# Patient Record
Sex: Male | Born: 1970 | Race: Black or African American | Hispanic: No | Marital: Married | Smoking: Former smoker
Health system: Southern US, Community
[De-identification: ages and names within clinical notes are randomized; demographics above are authoritative.]

## PROBLEM LIST (undated history)

## (undated) DIAGNOSIS — R809 Proteinuria, unspecified: Secondary | ICD-10-CM

## (undated) DIAGNOSIS — I639 Cerebral infarction, unspecified: Secondary | ICD-10-CM

## (undated) DIAGNOSIS — Z8669 Personal history of other diseases of the nervous system and sense organs: Secondary | ICD-10-CM

## (undated) DIAGNOSIS — I5020 Unspecified systolic (congestive) heart failure: Secondary | ICD-10-CM

## (undated) DIAGNOSIS — N183 Chronic kidney disease, stage 3 unspecified: Secondary | ICD-10-CM

## (undated) DIAGNOSIS — R011 Cardiac murmur, unspecified: Secondary | ICD-10-CM

## (undated) DIAGNOSIS — Z87898 Personal history of other specified conditions: Secondary | ICD-10-CM

## (undated) DIAGNOSIS — R590 Localized enlarged lymph nodes: Secondary | ICD-10-CM

## (undated) DIAGNOSIS — H547 Unspecified visual loss: Secondary | ICD-10-CM

## (undated) DIAGNOSIS — I10 Essential (primary) hypertension: Secondary | ICD-10-CM

## (undated) HISTORY — DX: Personal history of other specified conditions: Z87.898

## (undated) HISTORY — DX: Proteinuria, unspecified: R80.9

## (undated) HISTORY — DX: Personal history of other diseases of the nervous system and sense organs: Z86.69

## (undated) HISTORY — DX: Morbid (severe) obesity due to excess calories: E66.01

## (undated) HISTORY — DX: Unspecified systolic (congestive) heart failure: I50.20

## (undated) HISTORY — DX: Essential (primary) hypertension: I10

## (undated) HISTORY — DX: Unspecified visual loss: H54.7

## (undated) HISTORY — DX: Cardiac murmur, unspecified: R01.1

## (undated) HISTORY — DX: Chronic kidney disease, stage 3 unspecified: N18.30

## (undated) HISTORY — DX: Localized enlarged lymph nodes: R59.0

## (undated) HISTORY — DX: Chronic kidney disease, stage 3 (moderate): N18.3

## (undated) SURGICAL SUPPLY — 2 items
CATH 8FR REPROCESSED SOUNDSTAR (CATHETERS) ×1 IMPLANT
INQWIRE 1.5J .035X260CM (WIRE) ×1 IMPLANT

---

## 2007-03-16 ENCOUNTER — Emergency Department: Payer: Self-pay | Admitting: Unknown Physician Specialty

## 2007-03-16 ENCOUNTER — Other Ambulatory Visit: Payer: Self-pay

## 2007-03-21 ENCOUNTER — Emergency Department: Payer: Self-pay | Admitting: Emergency Medicine

## 2007-07-01 ENCOUNTER — Other Ambulatory Visit: Payer: Self-pay

## 2007-07-01 ENCOUNTER — Emergency Department: Payer: Self-pay | Admitting: Emergency Medicine

## 2007-12-27 DIAGNOSIS — Z8669 Personal history of other diseases of the nervous system and sense organs: Secondary | ICD-10-CM

## 2007-12-27 HISTORY — DX: Personal history of other diseases of the nervous system and sense organs: Z86.69

## 2008-01-06 ENCOUNTER — Emergency Department: Payer: Self-pay | Admitting: Internal Medicine

## 2008-01-20 ENCOUNTER — Ambulatory Visit: Payer: Self-pay | Admitting: Family Medicine

## 2008-01-20 DIAGNOSIS — N183 Chronic kidney disease, stage 3 unspecified: Secondary | ICD-10-CM | POA: Insufficient documentation

## 2008-01-20 DIAGNOSIS — I130 Hypertensive heart and chronic kidney disease with heart failure and stage 1 through stage 4 chronic kidney disease, or unspecified chronic kidney disease: Secondary | ICD-10-CM

## 2008-01-23 ENCOUNTER — Emergency Department: Payer: Self-pay | Admitting: Emergency Medicine

## 2008-01-23 ENCOUNTER — Other Ambulatory Visit: Payer: Self-pay

## 2008-01-26 ENCOUNTER — Telehealth: Payer: Self-pay | Admitting: Family Medicine

## 2008-01-30 ENCOUNTER — Ambulatory Visit: Payer: Self-pay | Admitting: Family Medicine

## 2008-01-30 ENCOUNTER — Encounter (INDEPENDENT_AMBULATORY_CARE_PROVIDER_SITE_OTHER): Payer: Self-pay | Admitting: *Deleted

## 2008-01-30 DIAGNOSIS — G510 Bell's palsy: Secondary | ICD-10-CM | POA: Insufficient documentation

## 2008-02-06 ENCOUNTER — Encounter: Payer: Self-pay | Admitting: Family Medicine

## 2008-02-10 ENCOUNTER — Encounter (INDEPENDENT_AMBULATORY_CARE_PROVIDER_SITE_OTHER): Payer: Self-pay | Admitting: *Deleted

## 2008-02-10 ENCOUNTER — Telehealth (INDEPENDENT_AMBULATORY_CARE_PROVIDER_SITE_OTHER): Payer: Self-pay | Admitting: *Deleted

## 2008-03-19 ENCOUNTER — Ambulatory Visit: Payer: Self-pay | Admitting: Family Medicine

## 2008-03-19 ENCOUNTER — Ambulatory Visit: Payer: Self-pay | Admitting: Internal Medicine

## 2008-03-19 DIAGNOSIS — H547 Unspecified visual loss: Secondary | ICD-10-CM

## 2008-03-19 DIAGNOSIS — R51 Headache: Secondary | ICD-10-CM

## 2008-03-19 DIAGNOSIS — R519 Headache, unspecified: Secondary | ICD-10-CM | POA: Insufficient documentation

## 2008-03-19 LAB — CONVERTED CEMR LAB
Bilirubin Urine: NEGATIVE
Urobilinogen, UA: 0.2
pH: 6

## 2008-03-20 ENCOUNTER — Encounter: Payer: Self-pay | Admitting: Family Medicine

## 2008-03-21 ENCOUNTER — Ambulatory Visit: Payer: Self-pay | Admitting: Family Medicine

## 2008-03-21 ENCOUNTER — Telehealth: Payer: Self-pay | Admitting: Family Medicine

## 2008-03-21 DIAGNOSIS — N289 Disorder of kidney and ureter, unspecified: Secondary | ICD-10-CM | POA: Insufficient documentation

## 2008-03-21 LAB — CONVERTED CEMR LAB
BUN: 23 mg/dL (ref 6–23)
Chloride: 105 meq/L (ref 96–112)
Creatinine, Ser: 1.7 mg/dL — ABNORMAL HIGH (ref 0.4–1.5)
GFR calc non Af Amer: 49 mL/min
Glucose, Bld: 93 mg/dL (ref 70–99)

## 2008-04-02 ENCOUNTER — Telehealth (INDEPENDENT_AMBULATORY_CARE_PROVIDER_SITE_OTHER): Payer: Self-pay | Admitting: *Deleted

## 2008-04-04 ENCOUNTER — Ambulatory Visit: Payer: Self-pay | Admitting: Family Medicine

## 2008-04-10 ENCOUNTER — Ambulatory Visit: Payer: Self-pay | Admitting: Family Medicine

## 2008-11-25 HISTORY — PX: US ECHOCARDIOGRAPHY: HXRAD669

## 2008-11-25 HISTORY — PX: OTHER SURGICAL HISTORY: SHX169

## 2008-12-02 ENCOUNTER — Inpatient Hospital Stay: Payer: Self-pay | Admitting: Internal Medicine

## 2008-12-02 ENCOUNTER — Encounter: Payer: Self-pay | Admitting: Family Medicine

## 2008-12-04 ENCOUNTER — Encounter: Payer: Self-pay | Admitting: Family Medicine

## 2008-12-05 ENCOUNTER — Encounter: Payer: Self-pay | Admitting: Family Medicine

## 2009-04-27 HISTORY — PX: US ECHOCARDIOGRAPHY: HXRAD669

## 2010-03-12 ENCOUNTER — Inpatient Hospital Stay (HOSPITAL_COMMUNITY)
Admission: EM | Admit: 2010-03-12 | Discharge: 2010-03-17 | Payer: Self-pay | Source: Home / Self Care | Admitting: Emergency Medicine

## 2010-03-12 ENCOUNTER — Ambulatory Visit: Payer: Self-pay | Admitting: Cardiovascular Disease

## 2010-03-14 ENCOUNTER — Encounter (INDEPENDENT_AMBULATORY_CARE_PROVIDER_SITE_OTHER): Payer: Self-pay | Admitting: Internal Medicine

## 2010-03-18 ENCOUNTER — Encounter: Payer: Self-pay | Admitting: Family Medicine

## 2010-04-23 DIAGNOSIS — E1129 Type 2 diabetes mellitus with other diabetic kidney complication: Secondary | ICD-10-CM

## 2010-04-23 DIAGNOSIS — E118 Type 2 diabetes mellitus with unspecified complications: Secondary | ICD-10-CM | POA: Insufficient documentation

## 2010-04-23 DIAGNOSIS — R809 Proteinuria, unspecified: Secondary | ICD-10-CM

## 2010-04-23 DIAGNOSIS — E1165 Type 2 diabetes mellitus with hyperglycemia: Secondary | ICD-10-CM

## 2010-04-25 ENCOUNTER — Ambulatory Visit: Payer: Self-pay

## 2010-04-25 ENCOUNTER — Ambulatory Visit: Payer: Self-pay | Admitting: Cardiovascular Disease

## 2010-05-27 NOTE — Letter (Signed)
Summary: Encounter Notice/[facility]  Encounter Notice/[facility]   Imported By: Lanelle Bal 03/28/2010 13:00:16  _____________________________________________________________________  External Attachment:    Type:   Image     Comment:   External Document

## 2010-06-17 NOTE — H&P (Signed)
Randy [name], Randy [name]              ACCOUNT NO.:  [id number]  MEDICAL RECORD NO.:  [id number]           PATIENT TYPE:  LOCATION:  [facility]                         FACILITY:  [facility]  PHYSICIAN:  Lucile Crater, MD         DATE OF BIRTH:  Sep 21, 1970  DATE OF ADMISSION:  03/12/2010 DATE OF DISCHARGE:                             HISTORY & PHYSICAL   CHIEF COMPLAINT:  Chest pain, blurred vision.  HISTORY OF PRESENT ILLNESS:  The patient is a 40 year old African American male diagnosed with hypertension 4-5 years ago.  He has been treated with hydrochlorothiazide and has been reportedly compliant with his medications.  He does not regularly check his blood pressure at home.  He took his last dose of hydrochlorothiazide earlier this morning.  He was driving his car and he noticed sudden onset left-sided chest pain with radiation to the left upper extremity.  And he had shortness of breath associated with it.  And he also had blurry vision in his left eye and so he pulled the car and he got out of the car and noticed that he was extremely short of breath.  He denies any worsening of the pain with deep breaths.  The pain is not reproduced with palpation, 6/10 in intensity at the max.  No specific aggravating or alleviating factors.  The patient presented to the emergency room and at that time his blood pressure was found to be 210/146.  He was treated with labetalol IV 10 mg, followed by 10 mg, followed by 20 mg.  After about 8 hours of  treatment his blood pressure is now at 189/122.  He reports complete resolution of the chest pain.  He has mild blurred vision in his left eye still.  He had a CT scan of the head that was negative for any acute changes.  The EKG did not reveal any ST changes but he did have isolated T-wave inversions in lead I and AVL.  His creatinine is found to be mildly elevated at 1.7.  REVIEW OF SYSTEMS:  A complete review of systems was done which include general, head,  eyes, ears, nose and throat, cardiovascular, respiratory, GI, GU, endocrine, musculoskeletal and psychiatric.  All are within normal other than what is mentioned in history of present illness.  PAST MEDICAL HISTORY: 1. Hypertension. 2. Bell's palsy. 3. Hypertensive urgency.  ALLERGIES:  None.  CURRENT MEDICATIONS:  Hydrochlorothiazide 25 mg once a day.  SOCIAL HISTORY:  He works for [employer] of [place].  He lives with his newly wed wife.  He has one daughter.  There is no history of tobacco or illicit drugs.  He rarely consumes alcohol.  FAMILY HISTORY:  There is strong family history of hypertension.  Both his parents are hypertensive.  There is no history of coronary artery disease.  There is no history of CVA.  PHYSICAL EXAM:  VITALS:  T-max 98.4, pulse rate 74, respiratory 18, O2 sats 99% on room air.  Blood pressure 174/94. GENERAL APPEARANCE:  Not in acute distress, alert and oriented x3. HEENT: Normocephalic, atraumatic.  Pupils are equal and reactive to light and  accommodation.  Extraocular movements intact.  Mucosa moist. NECK:  Supple.  No JVD, lymphadenopathy or carotid bruit. CVS:  Regular rhythm.  Rate is normal.  No murmurs, rubs or gallops. LUNGS:  Clear to auscultation bilaterally. ABDOMEN:  Soft, nontender, nondistended.  No hepatosplenomegaly. EXTREMITIES: No clubbing, cyanosis or edema. NEUROLOGIC EXAMINATION:  Nonfocal.  STUDIES:  CT scan of the head: 1. No acute intracranial abnormality or significant internal interval     change. 2. Stable scattered subcortical white matter hypoattenuation.  The     finding is nonspecific but can be seen in the setting of chronic     microvascular ischemia, a demyelinating process such as multiple     sclerosis, vasculitis, complicated migraine headaches are sequelae     of a prior infectious or inflammatory process.  A relative     posterior pattern is noted.  Please correlate with the existence of     significant  hypertension which included the posterior reversible     encephalopathy syndrome.  CT angiography of the neck:  No evidence for carotid injury or significant stenosis.  Minimal atherosclerotic calcification of the left cavernous internal carotid artery.  No significant stenosis is associated.  Next, prominent superior mediastinal and to a lesser extent bilateral cervical adenopathy.  While this could be reactive it raises concern for a lymphoproliferative disorder or  potentially primary neoplasm of the chest.  Urinalysis reveals proteinuria with a small amount of ketones.  Urine drug screen is negative.  CK-MB 1.7, troponin less than 0.05.  Chest x-ray 2-view:  Mild interstitial prominence and indistinctness may be due to edema.  Sodium 137, potassium 3.7, chloride 105, bicarb 26, BUN 17, creatinine 1.7, blood glucose 144.  Total protein 7, albumin 3.4, ALT 35, AST 36, alkaline phosphatase 54, total bili 0.5.  WBC 8800, hemoglobin 14, hematocrit 42, platelets 239, normal differential.  EKG normal sinus rhythm at the rate of 98 beats per minute.  Normal axis.  Normal PR and QT intervals.  Occasional PVCs.  T-wave inversionsin lead I and AVL.  Left ventricular  hypertrophy.  No ST-segment changes.  ASSESSMENT AND PLAN: 1. Hypertensive urgency.  The patient has been on antihypertensive     medication for the past 4-5 years.  This is a very young age for     the onset of hypertension.  He has been compliant with his     medications.  He has required many doses of mannitol to bring it     under control.  We will continue his hydrochlorothiazide.  We will     add calcium channel blocker and a beta blocker to his medications.     We will not be too aggressive as to lower the blood pressure to     120/80 tonight.  We will be gradually lowering the blood pressure.     We will have p.r.n. IV medications but we will not be starting him     on a drip at this point.  The lowering of  blood pressure is     associated with severe risk like a stroke.  He does have evidence     of end-organ damage.  His creatinine is 1.7.  We will closely     monitor the kidney function.  We will have him on  telemetry.  We     will cycle cardiac enzymes as well. 2. Chest pain most likely secondary to #1.  EKG nonspecific changes.     Nothing to suggest  ischemia at this point.  We will cycle cardiac     enzymes and monitor him closely. 3. Mediastinal lymphadenopathy.  We will get a CT scan of the chest.     His kidneys are in mild failure with creatinine of 1.7, so we will     wait for the renal function to improve before we get this. 4. Hypertension, uncontrolled as above.  We will evaluate with a renal     artery duplex to see for any secondary cause of hypertension and if     normal we will consider getting a workup for pheochromocytoma as     well.  Most likely this is secondary to suboptimal medications.  We     will monitor him closely. 5. Deep venous thrombosis prophylaxis with unfractionated heparin.  CODE STATUS:  Full.     Lucile Crater, MD     TA/MEDQ  D:  03/12/2010  T:  03/12/2010  Job:  [id number]  Electronically Signed by Lucile Crater MD on 06/17/2010 04:12:40 PM

## 2010-07-08 LAB — CATECHOLAMINES, FRACTIONATED, URINE, 24 HOUR
Catecholamines T: 86 mcg/24 h (ref 26–121)
Creatinine, Urine mg/day-CATEUR: 2.77 g/(24.h) — ABNORMAL HIGH (ref 0.63–2.50)
Dopamine 24 Hr Urine: 171 mcg/24 h (ref 52–480)

## 2010-07-08 LAB — BASIC METABOLIC PANEL
CO2: 24 mEq/L (ref 19–32)
Calcium: 9.3 mg/dL (ref 8.4–10.5)
Calcium: 9.5 mg/dL (ref 8.4–10.5)
Chloride: 104 mEq/L (ref 96–112)
Creatinine, Ser: 1.54 mg/dL — ABNORMAL HIGH (ref 0.4–1.5)
GFR calc Af Amer: >60 mL/min (ref >60)
GFR calc Af Amer: >60 mL/min (ref >60)
GFR calc non Af Amer: 51 mL/min — ABNORMAL LOW (ref >60)
Potassium: 3.7 mEq/L (ref 3.5–5.1)
Sodium: 138 mEq/L (ref 135–145)

## 2010-07-08 LAB — COMPREHENSIVE METABOLIC PANEL
ALT: 35 U/L (ref 0–53)
AST: 25 U/L (ref 0–37)
Albumin: 3.3 g/dL — ABNORMAL LOW (ref 3.5–5.2)
Alkaline Phosphatase: 46 U/L (ref 39–117)
Alkaline Phosphatase: 47 U/L (ref 39–117)
Alkaline Phosphatase: 54 U/L (ref 39–117)
BUN: 12 mg/dL (ref 6–23)
BUN: 13 mg/dL (ref 6–23)
BUN: 17 mg/dL (ref 6–23)
Calcium: 9 mg/dL (ref 8.4–10.5)
Chloride: 105 mEq/L (ref 96–112)
Chloride: 105 mEq/L (ref 96–112)
Creatinine, Ser: 1.49 mg/dL (ref 0.4–1.5)
Creatinine, Ser: 1.7 mg/dL — ABNORMAL HIGH (ref 0.4–1.5)
Creatinine, Ser: 1.73 mg/dL — ABNORMAL HIGH (ref 0.4–1.5)
GFR calc Af Amer: >60 mL/min (ref >60)
GFR calc non Af Amer: 46 mL/min — ABNORMAL LOW (ref >60)
Glucose, Bld: 111 mg/dL — ABNORMAL HIGH (ref 70–99)
Glucose, Bld: 144 mg/dL — ABNORMAL HIGH (ref 70–99)
Glucose, Bld: 180 mg/dL — ABNORMAL HIGH (ref 70–99)
Potassium: 3.3 mEq/L — ABNORMAL LOW (ref 3.5–5.1)
Potassium: 3.4 mEq/L — ABNORMAL LOW (ref 3.5–5.1)
Potassium: 4.2 mEq/L (ref 3.5–5.1)
Sodium: 137 mEq/L (ref 135–145)
Total Bilirubin: 0.5 mg/dL (ref 0.3–1.2)
Total Bilirubin: 0.6 mg/dL (ref 0.3–1.2)
Total Protein: 7 g/dL (ref 6.0–8.3)
Total Protein: 7.1 g/dL (ref 6.0–8.3)
Total Protein: 7.4 g/dL (ref 6.0–8.3)

## 2010-07-08 LAB — PHOSPHORUS
Phosphorus: 3.7 mg/dL (ref 2.3–4.6)
Phosphorus: 4.3 mg/dL (ref 2.3–4.6)

## 2010-07-08 LAB — URINALYSIS, ROUTINE W REFLEX MICROSCOPIC
Glucose, UA: NEGATIVE mg/dL
Leukocytes, UA: NEGATIVE
Nitrite: NEGATIVE
Specific Gravity, Urine: 1.029 (ref 1.005–1.030)
pH: 6.5 (ref 5.0–8.0)

## 2010-07-08 LAB — RAPID URINE DRUG SCREEN, HOSP PERFORMED
Benzodiazepines: NOT DETECTED
Cocaine: NOT DETECTED
Opiates: NOT DETECTED
Tetrahydrocannabinol: NOT DETECTED

## 2010-07-08 LAB — POCT CARDIAC MARKERS
CKMB, poc: 1.7 ng/mL (ref 1.0–8.0)
CKMB, poc: 2 ng/mL (ref 1.0–8.0)
Troponin i, poc: <0.05 ng/mL (ref 0.00–0.09)
Troponin i, poc: <0.05 ng/mL (ref 0.00–0.09)
Troponin i, poc: <0.05 ng/mL (ref 0.00–0.09)

## 2010-07-08 LAB — CATECHOLAMINES, FRACTIONATED, PLASMA: Total Catecholamines (Nor+Epi): 1072 pg/mL

## 2010-07-08 LAB — GLUCOSE, CAPILLARY
Glucose-Capillary: 124 mg/dL — ABNORMAL HIGH (ref 70–99)
Glucose-Capillary: 125 mg/dL — ABNORMAL HIGH (ref 70–99)
Glucose-Capillary: 132 mg/dL — ABNORMAL HIGH (ref 70–99)
Glucose-Capillary: 140 mg/dL — ABNORMAL HIGH (ref 70–99)
Glucose-Capillary: 144 mg/dL — ABNORMAL HIGH (ref 70–99)
Glucose-Capillary: 155 mg/dL — ABNORMAL HIGH (ref 70–99)
Glucose-Capillary: 162 mg/dL — ABNORMAL HIGH (ref 70–99)
Glucose-Capillary: 202 mg/dL — ABNORMAL HIGH (ref 70–99)
Glucose-Capillary: 202 mg/dL — ABNORMAL HIGH (ref 70–99)

## 2010-07-08 LAB — DIFFERENTIAL
Basophils Absolute: 0 10*3/uL (ref 0.0–0.1)
Basophils Relative: 0 % (ref 0–1)
Lymphocytes Relative: 33 % (ref 12–46)
Lymphocytes Relative: 37 % (ref 12–46)
Lymphs Abs: 2.9 10*3/uL (ref 0.7–4.0)
Monocytes Relative: 9 % (ref 3–12)
Neutro Abs: 4.8 10*3/uL (ref 1.7–7.7)
Neutro Abs: 4.9 10*3/uL (ref 1.7–7.7)
Neutrophils Relative %: 53 % (ref 43–77)
Neutrophils Relative %: 56 % (ref 43–77)

## 2010-07-08 LAB — CBC
HCT: 42.8 % (ref 39.0–52.0)
Hemoglobin: 13.8 g/dL (ref 13.0–17.0)
MCH: 25.7 pg — ABNORMAL LOW (ref 26.0–34.0)
MCH: 25.7 pg — ABNORMAL LOW (ref 26.0–34.0)
MCHC: 32.9 g/dL (ref 30.0–36.0)
MCHC: 33 g/dL (ref 30.0–36.0)
Platelets: 235 10*3/uL (ref 150–400)
Platelets: 251 10*3/uL (ref 150–400)
RBC: 5.38 MIL/uL (ref 4.22–5.81)
RBC: 5.42 MIL/uL (ref 4.22–5.81)
RDW: 15.2 % (ref 11.5–15.5)
RDW: 15.3 % (ref 11.5–15.5)
RDW: 15.5 % (ref 11.5–15.5)
WBC: 9.1 10*3/uL (ref 4.0–10.5)
WBC: 9.2 10*3/uL (ref 4.0–10.5)

## 2010-07-08 LAB — D-DIMER, QUANTITATIVE: D-Dimer, Quant: 0.31 ug/mL-FEU (ref 0.00–0.48)

## 2010-07-08 LAB — HEMOGLOBIN A1C: Hgb A1c MFr Bld: 6.6 % — ABNORMAL HIGH (ref <5.7)

## 2010-07-08 LAB — CARDIAC PANEL(CRET KIN+CKTOT+MB+TROPI)
CK, MB: 4 ng/mL (ref 0.3–4.0)
Total CK: 261 U/L — ABNORMAL HIGH (ref 7–232)
Total CK: 266 U/L — ABNORMAL HIGH (ref 7–232)
Troponin I: 0.06 ng/mL (ref 0.00–0.06)

## 2010-07-08 LAB — MAGNESIUM: Magnesium: 1.9 mg/dL (ref 1.5–2.5)

## 2010-07-08 LAB — URINE MICROSCOPIC-ADD ON

## 2010-07-08 LAB — HEPATIC FUNCTION PANEL
AST: 30 U/L (ref 0–37)
Albumin: 3.3 g/dL — ABNORMAL LOW (ref 3.5–5.2)
Bilirubin, Direct: 0.2 mg/dL (ref 0.0–0.3)
Total Protein: 6.9 g/dL (ref 6.0–8.3)

## 2010-07-08 LAB — TROPONIN I: Troponin I: 0.05 ng/mL (ref 0.00–0.06)

## 2010-07-08 LAB — METANEPHRINES, PLASMA
Metanephrine, Free: <25 pg/mL (ref <=57)
Normetanephrine, Free: 107 pg/mL (ref <=148)

## 2010-07-08 LAB — LIPID PANEL
LDL Cholesterol: 68 mg/dL (ref 0–99)
Total CHOL/HDL Ratio: 2.7 RATIO
Triglycerides: 44 mg/dL (ref <150)
VLDL: 9 mg/dL (ref 0–40)

## 2010-07-08 LAB — ALDOSTERONE + RENIN ACTIVITY W/ RATIO
Aldosterone: 3 ng/dL
PRA LC/MS/MS: 0.13 ng/mL/h — ABNORMAL LOW (ref 0.25–5.82)

## 2010-07-08 LAB — TSH: TSH: 1.345 u[IU]/mL (ref 0.350–4.500)

## 2010-07-08 LAB — CK TOTAL AND CKMB (NOT AT ARMC): Relative Index: 1.5 (ref 0.0–2.5)

## 2010-09-04 ENCOUNTER — Encounter: Payer: Self-pay | Admitting: Family Medicine

## 2010-09-05 ENCOUNTER — Ambulatory Visit (INDEPENDENT_AMBULATORY_CARE_PROVIDER_SITE_OTHER): Payer: Managed Care, Other (non HMO) | Admitting: Family Medicine

## 2010-09-05 ENCOUNTER — Telehealth: Payer: Self-pay | Admitting: Family Medicine

## 2010-09-05 ENCOUNTER — Encounter: Payer: Self-pay | Admitting: Family Medicine

## 2010-09-05 VITALS — BP 180/118 | HR 88 | Temp 97.9°F | Ht 78.0 in | Wt 398.1 lb

## 2010-09-05 DIAGNOSIS — N259 Disorder resulting from impaired renal tubular function, unspecified: Secondary | ICD-10-CM

## 2010-09-05 DIAGNOSIS — I10 Essential (primary) hypertension: Secondary | ICD-10-CM

## 2010-09-05 DIAGNOSIS — I5020 Unspecified systolic (congestive) heart failure: Secondary | ICD-10-CM

## 2010-09-05 DIAGNOSIS — E119 Type 2 diabetes mellitus without complications: Secondary | ICD-10-CM

## 2010-09-05 LAB — POCT URINALYSIS DIPSTICK
Bilirubin, UA: NEGATIVE
Blood, UA: NEGATIVE
Nitrite, UA: NEGATIVE
Spec Grav, UA: 1.01
pH, UA: 6

## 2010-09-05 LAB — CBC WITH DIFFERENTIAL/PLATELET
Eosinophils Relative: 1.8 % (ref 0.0–5.0)
HCT: 42.5 % (ref 39.0–52.0)
Hemoglobin: 13.8 g/dL (ref 13.0–17.0)
Lymphs Abs: 3.1 10*3/uL (ref 0.7–4.0)
MCV: 76.4 fl — ABNORMAL LOW (ref 78.0–100.0)
Monocytes Absolute: 1.1 10*3/uL — ABNORMAL HIGH (ref 0.1–1.0)
Neutro Abs: 5.1 10*3/uL (ref 1.4–7.7)
Platelets: 245 10*3/uL (ref 150.0–400.0)
RDW: 18.4 % — ABNORMAL HIGH (ref 11.5–14.6)

## 2010-09-05 LAB — COMPREHENSIVE METABOLIC PANEL
AST: 28 U/L (ref 0–37)
Albumin: 4 g/dL (ref 3.5–5.2)
Alkaline Phosphatase: 57 U/L (ref 39–117)
Potassium: 4.4 mEq/L (ref 3.5–5.1)
Sodium: 138 mEq/L (ref 135–145)
Total Protein: 7.6 g/dL (ref 6.0–8.3)

## 2010-09-05 LAB — HEMOGLOBIN A1C: Hgb A1c MFr Bld: 6.6 % — ABNORMAL HIGH (ref 4.6–6.5)

## 2010-09-05 MED ORDER — LOSARTAN POTASSIUM 100 MG PO TABS: 100.0000 mg | ORAL_TABLET | Freq: Every day | ORAL | Status: DC

## 2010-09-05 MED ORDER — FUROSEMIDE 20 MG PO TABS: 20.0000 mg | ORAL_TABLET | Freq: Every day | ORAL | Status: DC

## 2010-09-05 MED ORDER — HYDRALAZINE HCL 50 MG PO TABS: 50.0000 mg | ORAL_TABLET | Freq: Three times a day (TID) | ORAL | Status: DC

## 2010-09-05 NOTE — Telephone Encounter (Signed)
Please notify kidney function elevated but stable.  Would tolerate stronger diuretic, start new water pill lasix at 20mg  daily (sent in.)  Continue all other meds along with this addition for better blood pressure control. Sugar somewhat elevated.  A1c showing pt is early diabetes, weight loss will help with this. Thyroid normal, blood count normal. Keep appt for f/u. And will further discuss this.

## 2010-09-05 NOTE — Assessment & Plan Note (Addendum)
Hypertensive urgency. Echo 2011 with LVH and mild systolic dysfunction.  Concern for progressing heart failure.  Refilled losartan and will liely recommend start lasix, check Cr first as well as other basic blood work. No current CP/tightenss or other end organ damage. UA with 30 protein o/w WNL. Malignant HTN w/u including normal metanephrines/catecholamines, normal renal US 2010 and 2011 as well as w/u by Dr. Holley Raring Nephrology 2010 including ANA, SPEP/UPEP, ANCA, GBM, HIV, hep screen, and cryoglobulins but no records available.  Will obtain records instead of ordering more labs currently.   Will ask pt to sign ROI for [facility] as well as [place] [facility] ([place]

## 2010-09-05 NOTE — Patient Instructions (Signed)
Refilled losartan for now. Blood work today.   Low sodium diet - <1.5gm per day.  Start looking at sodium content in food. Keep track of blood pressure at home and bring log next visit. Return in 1-2 weeks for follow up with myself or Dr. Patsy Lager.

## 2010-09-05 NOTE — Progress Notes (Signed)
  Subjective:    Patient ID: Randy [name], male    DOB: 1970-07-18, 40 y.o.   MRN: [id number]  HPI CC: ankles swelling, HTN  40 yo with h/o HTN, ?DM noticing for last month ankles and entire legs swelling.  In am fine, then throughout day swelling worsens.  Sits all day at job (bank).  + SOB with exhertion more pronounced than in past.  Recently started spironolactone 2 mo ago.  Ran out of losartan 2 days ago.  No other changes to meds.  Previously saw Dr. Patsy Lager, then [facility].  Then some doctor in [place] [facility]  All switches 2/2 insurance providers.  Now wants to switch back to [place].  States checking BP at home, has been 180/110s.  States best control has been ~160sbp.  Tries to stay away from salt in diet, doesn't add extra salt.  Was in hospital for 1 wk in 02/2010 for vision loss, told had spiked blood pressure.  Echo with LVH per patient, as well as normal CT scan.  No CP/tightness.  No urinary changes.  abd pain.  No recent fevers, chills.  No PNdyspnea, no orthopnea.  Last hospitalization at [facility] 02/2010 records reviewed - A1c 6.6%, head CT no acute process, Cr 1.7, plasma and urine metanephrines/catecholamines WNL, serum aldosterone 3 nl, PRA low at 0.13, aldosterone 23.  24 hour protein 555 mg/day.  Chest CT 02/2010 - mild mediastinal and axillary LAD, ? Reactive adenopathy.  Rec f/u 6 mo with CT scan. Echo 02/2010 - severe LVH, EF 40-45%, LA midlly dilated, PA pressure moderately increased.  Wt Readings from Last 3 Encounters:  09/05/10 398 lb 1.9 oz (180.586 kg)  04/10/08 376 lb 4 oz (170.666 kg)  04/04/08 374 lb 4 oz (169.759 kg)   Medications and allergies reviewed and updated as above. PMHx, surghx, Fmhx and SHx reviewed and updated in chart.  Review of Systems Per HPI    Objective:   Physical Exam  Nursing note and vitals reviewed. Constitutional: He appears well-developed and well-nourished. No distress.  HENT:  Head: Normocephalic and atraumatic.    Mouth/Throat: Oropharynx is clear and moist. No oropharyngeal exudate.  Eyes: Conjunctivae are normal. Pupils are equal, round, and reactive to light. No scleral icterus.  Neck: Normal range of motion. Neck supple. No JVD present. Carotid bruit is not present. No thyromegaly present.  Cardiovascular: Normal rate, regular rhythm and intact distal pulses.  Exam reveals gallop and S3.   No murmur heard. Pulmonary/Chest: Effort normal and breath sounds normal. No respiratory distress. He has no wheezes. He has no rales.  Abdominal: Soft. Bowel sounds are normal.       No abd/renal bruits  Musculoskeletal: He exhibits edema (2+ pitting edema feet to knees).  Lymphadenopathy:    He has no cervical adenopathy.  Skin: Skin is warm. No rash noted.  Psychiatric: He has a normal mood and affect.          Assessment & Plan:

## 2010-09-05 NOTE — Assessment & Plan Note (Signed)
Mild systolic per last echo 02/2010.   On labetalol, consider switch to more selective. On ARB.  Will likely start lasix, await Cr for baseline.

## 2010-09-05 NOTE — Assessment & Plan Note (Addendum)
Recheck today. 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

## 2010-09-05 NOTE — Assessment & Plan Note (Addendum)
Recheck A1c. Pt denies this diagnosis, states told to come off glyburide. Likely no metformin as Cr >1.5.

## 2010-09-08 NOTE — Telephone Encounter (Signed)
Patient notified. He will start lasix and keep scheduled follow up.

## 2010-09-08 NOTE — Telephone Encounter (Signed)
Left message on voicemail to return my call.  

## 2010-09-12 ENCOUNTER — Ambulatory Visit: Payer: Managed Care, Other (non HMO) | Admitting: Family Medicine

## 2010-09-17 ENCOUNTER — Ambulatory Visit: Payer: Managed Care, Other (non HMO) | Admitting: Family Medicine

## 2010-10-10 ENCOUNTER — Ambulatory Visit: Payer: Managed Care, Other (non HMO) | Admitting: Family Medicine

## 2010-10-17 ENCOUNTER — Ambulatory Visit (INDEPENDENT_AMBULATORY_CARE_PROVIDER_SITE_OTHER): Payer: Managed Care, Other (non HMO) | Admitting: Family Medicine

## 2010-10-17 ENCOUNTER — Encounter: Payer: Self-pay | Admitting: Family Medicine

## 2010-10-17 VITALS — BP 156/84 | HR 96 | Temp 98.6°F | Wt >= 6400 oz

## 2010-10-17 DIAGNOSIS — I5020 Unspecified systolic (congestive) heart failure: Secondary | ICD-10-CM

## 2010-10-17 DIAGNOSIS — N259 Disorder resulting from impaired renal tubular function, unspecified: Secondary | ICD-10-CM

## 2010-10-17 DIAGNOSIS — I10 Essential (primary) hypertension: Secondary | ICD-10-CM

## 2010-10-17 DIAGNOSIS — E119 Type 2 diabetes mellitus without complications: Secondary | ICD-10-CM

## 2010-10-17 LAB — BASIC METABOLIC PANEL
BUN: 18 mg/dL (ref 6–23)
CO2: 27 mEq/L (ref 19–32)
Chloride: 106 mEq/L (ref 96–112)
Creatinine, Ser: 1.8 mg/dL — ABNORMAL HIGH (ref 0.4–1.5)
Glucose, Bld: 152 mg/dL — ABNORMAL HIGH (ref 70–99)
Potassium: 4 mEq/L (ref 3.5–5.1)

## 2010-10-17 MED ORDER — FUROSEMIDE 40 MG PO TABS: 40.0000 mg | ORAL_TABLET | Freq: Every day | ORAL | Status: DC

## 2010-10-17 NOTE — Assessment & Plan Note (Signed)
Actually has gained weight despite exercise, watching salt intake.   Encouraged continued activity.

## 2010-10-17 NOTE — Assessment & Plan Note (Signed)
Mild systolic, last echo 02/2010. Consider switch to metoprolol or coreg.   Has tolerated lasix 20mg  daily well.  Increase today to 40, recheck in 1 mo.

## 2010-10-17 NOTE — Assessment & Plan Note (Signed)
Lab Results  Component Value Date   CREATININE 1.6* 09/05/2010  Cr staying elevated and stable. Advised to stay away from ibuprofen, alleve, advil, motrin.  Tylenol ok.  Advised importance of staying well hydrated. Consider microalbumin next visit.

## 2010-10-17 NOTE — Assessment & Plan Note (Signed)
Improved with diet change, starting meds. Continue, increase lasix as does have LE edema. check Cr today to ensure tolerating loop diuretic. rtc 1 mo for f/u.

## 2010-10-17 NOTE — Patient Instructions (Addendum)
Blood work today. Check which is pink pill and call us to let us know. Increase lasix to 40mg  daily. Keep up exercise, stay as hydrated as possible if running. Low salt diet (low sodium)  potassium is ok. Return in 1 month for repeat blood work and recheck blood pressure.

## 2010-10-17 NOTE — Assessment & Plan Note (Signed)
Diet controlled. Lab Results  Component Value Date   HGBA1C 6.6* 09/05/2010

## 2010-10-17 NOTE — Progress Notes (Signed)
Subjective:    Patient ID: Randy [name], male    DOB: 07-20-1970, 40 y.o.   MRN: [id number]  HPI CC: bp f/u  Seen 1 mo ago with HTN urgency, No HA, vision changes, CP/tightness.  + SOB and leg swelling, stable and improved.  Doing ok with these medicines.  Trying to decrease salt, drinking water.  Actually feeling overall better last few weeks. BP Readings from Last 3 Encounters:  10/17/10 156/84  09/05/10 180/118  04/10/08 170/100   CRI - Cr 1.7.  Baseline seems to be 1.5-1.8.  Used to take large amounts of NSAIDs (ibuprofen).    Has started running - at [place].  Runs 1 mile, no chest pain.   Wt Readings from Last 3 Encounters:  10/17/10 406 lb 1.9 oz (184.215 kg)  09/05/10 398 lb 1.9 oz (180.586 kg)  04/10/08 376 lb 4 oz (170.666 kg)   No nausea, abd pain, chest pain.   Lab Results  Component Value Date   TSH 0.90 09/05/2010    Preventative: No recent physical.   No records from prior pcp yet.  Patient Active Problem List  Diagnoses  . MORBID OBESITY  . BELL'S PALSY, LEFT  . VISUAL ACUITY, DECREASED, LEFT EYE  . HYPERTENSION  . RENAL INSUFFICIENCY  . INTERNAL DERANGEMENT, LEFT KNEE  . DM  . Systolic CHF   Past Medical History  Diagnosis Date  . HTN (hypertension)   . Diabetes mellitus 2012    dx hospitalization with A1c 6.6%  . Renal insufficiency     baseline Cr 1.5-1.8  . Decreased visual acuity     Left eye, resolved  . Bell's palsy 9/09    Left  . Morbid obesity   . Internal derangement of knee 9/09    Left  . History of headache   . Systolic murmur     states known murmur   No past surgical history on file. History  Substance Use Topics  . Smoking status: Never Smoker   . Smokeless tobacco: Never Used   Comment: monthly, when going out  . Alcohol Use: Yes     Occasional   Family History  Problem Relation Age of Onset  . Hypertension Mother   . Hypertension Father   . Diabetes Father   . Hypertension Brother   . Diabetes Sister   .  Coronary artery disease Neg Hx   . Stroke Neg Hx   . Cancer Neg Hx   . Kidney disease Paternal Grandmother     ESRD   No Known Allergies Current Outpatient Prescriptions on File Prior to Visit  Medication Sig Dispense Refill  . hydrALAZINE (APRESOLINE) 50 MG tablet Take 1 tablet (50 mg total) by mouth 3 (three) times daily.  90 tablet  3  . losartan (COZAAR) 100 MG tablet Take 1 tablet (100 mg total) by mouth daily.  30 tablet  3  . DISCONTD: furosemide (LASIX) 20 MG tablet Take 1 tablet (20 mg total) by mouth daily.  30 tablet  1  . labetalol (NORMODYNE) 300 MG tablet Take 300 mg by mouth 2 (two) times daily.        Marland Kitchen spironolactone (ALDACTONE) 50 MG tablet Take 50 mg by mouth 2 (two) times daily.         Review of Systems Per HPI    Objective:   Physical Exam  Nursing note and vitals reviewed. Constitutional: He appears well-developed and well-nourished. No distress.       Morbidly obes  HENT:  Head: Normocephalic and atraumatic.  Mouth/Throat: Oropharynx is clear and moist. No oropharyngeal exudate.  Eyes: Conjunctivae and EOM are normal. Pupils are equal, round, and reactive to light. No scleral icterus.  Neck: Normal range of motion. Neck supple. Carotid bruit is not present.  Cardiovascular: Normal rate, regular rhythm and intact distal pulses.   Murmur (2/6 SEM) heard. Pulmonary/Chest: Effort normal and breath sounds normal. No respiratory distress. He has no wheezes. He has no rales.  Abdominal: Soft. Bowel sounds are normal. He exhibits no distension. There is no tenderness. There is no rebound.       No abd/renal bruit  Musculoskeletal: He exhibits edema (1+ pitting bilaterally to knees).  Lymphadenopathy:    He has no cervical adenopathy.  Skin: Skin is warm and dry. No rash noted.          Assessment & Plan:

## 2010-10-22 ENCOUNTER — Telehealth: Payer: Self-pay

## 2010-10-22 NOTE — Telephone Encounter (Signed)
Patient notified as instructed by telephone. 

## 2010-10-22 NOTE — Telephone Encounter (Signed)
Message copied by Patience Musca on Wed Oct 22, 2010 11:45 AM ------      Message from: Eustaquio Boyden      Created: Mon Oct 20, 2010  2:27 AM       Please notify kidney function did bump up slightly, would like him to stay on previous dose of lasix (20mg  daily).  Will re eval next visit, may need to start new bp med, monitor for now.Marland Kitchen

## 2010-10-22 NOTE — Telephone Encounter (Signed)
Left vm for pt to callback 

## 2010-11-21 ENCOUNTER — Ambulatory Visit: Payer: Managed Care, Other (non HMO) | Admitting: Family Medicine

## 2010-11-28 ENCOUNTER — Ambulatory Visit (INDEPENDENT_AMBULATORY_CARE_PROVIDER_SITE_OTHER): Payer: Managed Care, Other (non HMO) | Admitting: Family Medicine

## 2010-11-28 ENCOUNTER — Encounter: Payer: Self-pay | Admitting: Family Medicine

## 2010-11-28 VITALS — BP 220/130 | HR 76 | Temp 98.8°F | Wt 396.0 lb

## 2010-11-28 DIAGNOSIS — I10 Essential (primary) hypertension: Secondary | ICD-10-CM

## 2010-11-28 DIAGNOSIS — N259 Disorder resulting from impaired renal tubular function, unspecified: Secondary | ICD-10-CM

## 2010-11-28 DIAGNOSIS — I5020 Unspecified systolic (congestive) heart failure: Secondary | ICD-10-CM

## 2010-11-28 MED ORDER — SPIRONOLACTONE 50 MG PO TABS: 50.0000 mg | ORAL_TABLET | Freq: Every day | ORAL | Status: DC

## 2010-11-28 MED ORDER — FUROSEMIDE 20 MG PO TABS: 20.0000 mg | ORAL_TABLET | Freq: Every day | ORAL | Status: DC

## 2010-11-28 NOTE — Patient Instructions (Addendum)
Blood work today.  Return next week fasting for blood work to check cholesterol levels.  Also to check blood pressure (nurse visit). Keep log of blood pressures and bring to next appointment. Restart spironolactone. Blood pressure medicines: spironolactone 50mg  daily, hydralazine three times daily, lopressor twice daily, losartan 100mg  daily, and lasix 20mg  daily. Your blood pressure is dangerously high.  We need to lower it. You need to be consistent with diet.

## 2010-11-28 NOTE — Assessment & Plan Note (Addendum)
Lab Results  Component Value Date   CREATININE 1.8* 10/17/2010  Cr staying elevated but stable. Advised to stay away from ibuprofen, alleve, advil, motrin.  Tylenol ok.  Advised importance of staying well hydrated. Consider microalbumin next visit. Will request records from Dr. Cherylann Ratel States has had w/u for secondary hypertension.  Still awaiting records.

## 2010-11-28 NOTE — Assessment & Plan Note (Addendum)
Return when fasting to check lipids next Friday.

## 2010-11-28 NOTE — Assessment & Plan Note (Signed)
Added lasix, noted improvement.  Down to 20mg  daily. Mild systolic last echo 02/2010.

## 2010-11-28 NOTE — Progress Notes (Signed)
  Subjective:    Patient ID: Randy [name], male    DOB: [birth year], 40 y.o.   MRN: [id number]  HPI CC: f/u HTN  1. HTN - "my BP has shot up".  Recent traveling to [facility] for work, just got back.  Ate on plane 2 hours ago.  States had been eating pork all week.  No vision changes, CP/tightness, SOB, leg swelling.  Leg swelling much better since starting lasix.  States blood pressure at home has been running "normal", sbp 160.  Advised this is still too high.  Has had previous malignant HTN w/u (see below).  No records available, will request again.  Was not taking spironolactone (50mg  bid in med list).  Endorses compliance with all other bp meds.  States bp elevated due to recent diet changes (taking out clients and poor food choices) BP Readings from Last 3 Encounters:  11/28/10 218/160  10/17/10 156/84  09/05/10 180/118   Wt Readings from Last 3 Encounters:  11/28/10 396 lb (179.624 kg)  10/17/10 406 lb 1.9 oz (184.215 kg)  09/05/10 398 lb 1.9 oz (180.586 kg)   2. Obesity - no recent lipid check.   3. DM - denies diagnosis, found during hospitalization for chest pain 03/2010.  Last A1c good control, diet controlled. Lab Results  Component Value Date   HGBA1C 6.6* 09/05/2010   4. CKD - Cr 1.8.  Baseline seems to be 1.5-1.8. Used to take large amounts of NSAIDs (ibuprofen).  Stage 3.  Thinks has seen Dr. Holley Raring, still no records available.  Asked him to call them as well as [facility] to check on this.  Chest CT 02/2010 - mild mediastinal and axillary LAD, ? Reactive adenopathy. Rec f/u 6 mo with CT scan.  Echo 02/2010 - severe LVH, EF 40-45%, LA midlly dilated, PA pressure moderately increased.  Malignant HTN w/u including normal metanephrines/catecholamines, normal renal US 2010 and 2011 as well as w/u by Dr. Holley Raring [facility]phrology 2010 including ANA, SPEP/UPEP, ANCA, GBM, HIV, hep screen, and cryoglobulins but no records available.  Pt states has filled out ROI x2.  Review of  Systems Per HPI    Objective:   Physical Exam  Nursing note and vitals reviewed. Constitutional: He appears well-developed and well-nourished. No distress.  HENT:  Head: Normocephalic and atraumatic.  Mouth/Throat: Oropharynx is clear and moist. No oropharyngeal exudate.  Eyes: Conjunctivae and EOM are normal. Pupils are equal, round, and reactive to light. No scleral icterus.  Neck: Normal range of motion. Neck supple. Carotid bruit is not present.  Cardiovascular: Normal rate, regular rhythm, normal heart sounds and intact distal pulses.   No murmur heard. Pulmonary/Chest: Effort normal and breath sounds normal. No respiratory distress. He has no wheezes. He has no rales.  Abdominal: Soft. Bowel sounds are normal. He exhibits no distension. There is no tenderness. There is no rebound and no guarding.       No abd/renal bruits  Musculoskeletal: He exhibits edema (mild pitting edema bilaterally).  Skin: Skin is warm and dry. No rash noted.  Psychiatric: He has a normal mood and affect.          Assessment & Plan:

## 2010-11-28 NOTE — Assessment & Plan Note (Signed)
Deteriorated. HTN urgency today.  Check Cr and K prior to starting spironolactone, return next week lab and nurse visit Friday to recheck after starting meds. Return 2 wks for office visit. Again discussed BP like this is dangerously high, increased risk of stroke, CAD/MI.

## 2010-11-29 LAB — BASIC METABOLIC PANEL
Calcium: 9.2 mg/dL (ref 8.4–10.5)
Creat: 1.69 mg/dL — ABNORMAL HIGH (ref 0.50–1.35)

## 2010-12-05 ENCOUNTER — Other Ambulatory Visit (INDEPENDENT_AMBULATORY_CARE_PROVIDER_SITE_OTHER): Payer: Managed Care, Other (non HMO) | Admitting: Family Medicine

## 2010-12-05 ENCOUNTER — Ambulatory Visit (INDEPENDENT_AMBULATORY_CARE_PROVIDER_SITE_OTHER): Payer: Managed Care, Other (non HMO) | Admitting: Family Medicine

## 2010-12-05 VITALS — BP 160/110

## 2010-12-05 DIAGNOSIS — I10 Essential (primary) hypertension: Secondary | ICD-10-CM

## 2010-12-05 LAB — LIPID PANEL
Cholesterol: 107 mg/dL (ref 0–200)
LDL Cholesterol: 49 mg/dL (ref 0–99)
Triglycerides: 48 mg/dL (ref 0.0–149.0)
VLDL: 9.6 mg/dL (ref 0.0–40.0)

## 2010-12-05 LAB — BASIC METABOLIC PANEL
Chloride: 107 mEq/L (ref 96–112)
Potassium: 3.8 mEq/L (ref 3.5–5.1)

## 2010-12-05 NOTE — Progress Notes (Signed)
Started spironolactone, attribute to improved control.  Still not at goal.   May increase to spironolactone to bid. Await blood work from today.

## 2010-12-05 NOTE — Progress Notes (Signed)
  Subjective:    Patient ID: Randy [name], male    DOB: [birth year], 40 y.o.   MRN: [id number]  HPI    Review of Systems     Objective:   Physical Exam        Assessment & Plan:  Patient came in this morning for Blood pressure check His blood pressure was 160/ 110 today which is better the on Monday. Patient has no complaints and feels fine. Patient also says that he is taken all medication on his med list. Spoke with Dr. Reece Agar and he said it was fine to let patient leave office. I also advised patient if their were any changes needed office would contact him later this afternoon

## 2010-12-07 ENCOUNTER — Encounter: Payer: Self-pay | Admitting: Family Medicine

## 2010-12-07 ENCOUNTER — Telehealth: Payer: Self-pay | Admitting: Family Medicine

## 2010-12-07 DIAGNOSIS — I10 Essential (primary) hypertension: Secondary | ICD-10-CM

## 2010-12-07 NOTE — Telephone Encounter (Addendum)
Please notify patient kidney function and potassium stable (but kidneys remains affected from years of high blood pressure). Cholesterol levels actually looking good. I want him to increase spironolactone to 50mg  twice daily and return 1 week after increasing dose to recheck potassium levels (order in chart).

## 2010-12-08 NOTE — Telephone Encounter (Signed)
Patient notified and will wait to increase spironolactone. He will keep his follow up as previously scheduled.

## 2010-12-08 NOTE — Telephone Encounter (Signed)
Advised pt.  He says he will not be able to return to the office until his appt on 8/24.  OK for him to wait that long before potassium is rechecked on increased dose of spironolactone?

## 2010-12-08 NOTE — Telephone Encounter (Signed)
No, have him increase spironolactone to twice daily 4-5 days prior to appt and we will check levels then.

## 2010-12-08 NOTE — Telephone Encounter (Signed)
Message left for patient to return my call to discuss labs.

## 2010-12-19 ENCOUNTER — Ambulatory Visit: Payer: Managed Care, Other (non HMO) | Admitting: Family Medicine

## 2010-12-26 ENCOUNTER — Other Ambulatory Visit: Payer: Self-pay | Admitting: *Deleted

## 2010-12-26 ENCOUNTER — Ambulatory Visit: Payer: Managed Care, Other (non HMO) | Admitting: Family Medicine

## 2010-12-26 MED ORDER — LABETALOL HCL 300 MG PO TABS: 300.0000 mg | ORAL_TABLET | Freq: Two times a day (BID) | ORAL | Status: DC

## 2010-12-26 NOTE — Telephone Encounter (Signed)
Patient is requesting a Rx refill.  He is on his way to the hospital to be with his wife.  She is pregnant and is cramping.  He apologizes for having to cancel his appt for today.  Please advise.  Uses [facility].

## 2010-12-26 NOTE — Telephone Encounter (Signed)
Ok to refill x 1 mo.  Would like him to return for OV when he can, in next month.

## 2010-12-26 NOTE — Telephone Encounter (Signed)
Message left notifying patient of refill and to reschedule his appt when he can within the next month.

## 2011-01-22 ENCOUNTER — Other Ambulatory Visit: Payer: Self-pay | Admitting: *Deleted

## 2011-01-22 MED ORDER — LOSARTAN POTASSIUM 100 MG PO TABS: 100.0000 mg | ORAL_TABLET | Freq: Every day | ORAL | Status: DC

## 2011-01-29 ENCOUNTER — Other Ambulatory Visit: Payer: Self-pay | Admitting: *Deleted

## 2011-01-29 MED ORDER — LOSARTAN POTASSIUM 100 MG PO TABS: 100.0000 mg | ORAL_TABLET | Freq: Every day | ORAL | Status: DC

## 2011-03-17 ENCOUNTER — Other Ambulatory Visit: Payer: Self-pay | Admitting: Family Medicine

## 2011-03-17 DIAGNOSIS — I10 Essential (primary) hypertension: Secondary | ICD-10-CM

## 2011-03-17 MED ORDER — LOSARTAN POTASSIUM 100 MG PO TABS: 100.0000 mg | ORAL_TABLET | Freq: Every day | ORAL | Status: DC

## 2011-03-17 MED ORDER — SPIRONOLACTONE 50 MG PO TABS: 50.0000 mg | ORAL_TABLET | Freq: Every day | ORAL | Status: DC

## 2011-03-17 NOTE — Telephone Encounter (Signed)
Have refilled cozaar and spironolactone. Please check with pt which meds he needs refills for.

## 2011-03-17 NOTE — Telephone Encounter (Signed)
Pt is out of his medicine.  He has scheduled appt for 12/03.

## 2011-03-18 NOTE — Telephone Encounter (Signed)
Those where the ones needed filling

## 2011-03-31 ENCOUNTER — Ambulatory Visit: Payer: Managed Care, Other (non HMO) | Admitting: Family Medicine

## 2011-04-03 ENCOUNTER — Telehealth: Payer: Self-pay | Admitting: *Deleted

## 2011-04-03 ENCOUNTER — Encounter: Payer: Self-pay | Admitting: Family Medicine

## 2011-04-03 ENCOUNTER — Ambulatory Visit (INDEPENDENT_AMBULATORY_CARE_PROVIDER_SITE_OTHER): Payer: Managed Care, Other (non HMO) | Admitting: Family Medicine

## 2011-04-03 VITALS — BP 162/118 | HR 68 | Temp 98.1°F | Wt >= 6400 oz

## 2011-04-03 DIAGNOSIS — I5020 Unspecified systolic (congestive) heart failure: Secondary | ICD-10-CM

## 2011-04-03 DIAGNOSIS — N259 Disorder resulting from impaired renal tubular function, unspecified: Secondary | ICD-10-CM

## 2011-04-03 DIAGNOSIS — I10 Essential (primary) hypertension: Secondary | ICD-10-CM

## 2011-04-03 DIAGNOSIS — Z23 Encounter for immunization: Secondary | ICD-10-CM

## 2011-04-03 DIAGNOSIS — E119 Type 2 diabetes mellitus without complications: Secondary | ICD-10-CM

## 2011-04-03 MED ORDER — LOSARTAN POTASSIUM 100 MG PO TABS: 100.0000 mg | ORAL_TABLET | Freq: Every day | ORAL | Status: DC

## 2011-04-03 MED ORDER — SPIRONOLACTONE 50 MG PO TABS: 50.0000 mg | ORAL_TABLET | Freq: Two times a day (BID) | ORAL | Status: DC

## 2011-04-03 MED ORDER — FUROSEMIDE 20 MG PO TABS: 20.0000 mg | ORAL_TABLET | Freq: Every day | ORAL | Status: DC

## 2011-04-03 NOTE — Telephone Encounter (Signed)
Patient meant to ask you about the knot on his left hand. He said you and he had talked about it before and it has now become very painful. He was asking if you could remove it or if he needed a referral. I told I would let him know what you suggest.

## 2011-04-03 NOTE — Progress Notes (Signed)
  Subjective:    Patient ID: Randy [name], male    DOB: 06-Mar-1971, 40 y.o.   MRN: [id number]  HPI CC: f/u HTN   Seen here 11/2010 with rec f/u 2 wks but instead returns 4 mo later for f/u.  No concerns today.  14 lb weight gain noted.  Attributes this to stress at home, inactivity (see below).  HTN - reports compliance with all meds.  No HA, vision changes, CP/tightness, SOB, leg swelling.  Has not been keeping track of bp at home.  Has had malignant htn workup in past.  States has seen nephrology, but never able to obtain records in past.  DM - diet controlled.  Does not check sugars. Lab Results  Component Value Date   HGBA1C 6.6* 09/05/2010   lipids- never on cholesterol meds.  No dx HLD. Lab Results  Component Value Date   LDLCALC 49 12/05/2010   Lab Results  Component Value Date   CHOL 107 12/05/2010   HDL 48.20 12/05/2010   LDLCALC 49 12/05/2010   TRIG 48.0 12/05/2010   CHOLHDL 2 12/05/2010   CRI - baseline CR around 1.5-1.8.  Knows to avoid NSAIDs.  New baby 1 mo old.  Wife with possible postpartum depression.  Stress stemming from this at home.  Having to stay at home to take care of baby, has not had time to exercise  Preventative: Flu - today States Tdap 2 mo ago.  Past Medical History  Diagnosis Date  . HTN (hypertension), malignant     previously on BC and goody powders for HA  . Diabetes mellitus 2012    dx hospitalization with A1c 6.6%  . CKD (chronic kidney disease) stage 3, GFR 30-59 ml/min     baseline Cr 1.5-1.7  . Decreased visual acuity     Left eye, resolved  . Bell's palsy 9/09    Left  . Morbid obesity   . Internal derangement of knee 9/09    Left  . History of headache   . Systolic murmur     states known murmur  . Proteinuria   . Vitamin D deficiency    Review of Systems Per HPI    Objective:   Physical Exam  Nursing note and vitals reviewed. Constitutional: He appears well-developed and well-nourished. No distress.       overweight   HENT:  Head: Normocephalic and atraumatic.  Mouth/Throat: Oropharynx is clear and moist. No oropharyngeal exudate.  Eyes: Conjunctivae and EOM are normal. Pupils are equal, round, and reactive to light. No scleral icterus.  Neck: Normal range of motion. Neck supple. Carotid bruit is not present.  Cardiovascular: Normal rate, regular rhythm, normal heart sounds and intact distal pulses.   No murmur heard. Pulmonary/Chest: Effort normal and breath sounds normal. No respiratory distress. He has no wheezes. He has no rales.  Abdominal: Soft. Bowel sounds are normal. There is no tenderness.       No abd/renal bruits  Musculoskeletal: He exhibits edema (tr pitting pedal edema).  Lymphadenopathy:    He has no cervical adenopathy.  Skin: Skin is warm and dry. No rash noted.       Assessment & Plan:

## 2011-04-03 NOTE — Patient Instructions (Signed)
Continue meds as up to now Only change: increase spironolactone to 50mg  twice daily. Blood work today Return in 1 week for repeat blood work to monitor potassium.   Return in 1 month for office visit Keep track of blood pressures at home. Bring all your medicines in 1 month. Flu shot today.

## 2011-04-04 LAB — RENAL FUNCTION PANEL
BUN: 19 mg/dL (ref 6–23)
CO2: 25 mEq/L (ref 19–32)
Chloride: 104 mEq/L (ref 96–112)
Creat: 1.54 mg/dL — ABNORMAL HIGH (ref 0.50–1.35)
Glucose, Bld: 133 mg/dL — ABNORMAL HIGH (ref 70–99)

## 2011-04-04 NOTE — Assessment & Plan Note (Signed)
Recheck Cr today. Have discussed avoidance of NSAIDs in past.

## 2011-04-04 NOTE — Assessment & Plan Note (Signed)
Diet controlled.  A1c today. 

## 2011-04-04 NOTE — Assessment & Plan Note (Signed)
Mild systolic dysfunction last echo 02/2010 On ARB, lasix, labetalol, hydralazine.

## 2011-04-04 NOTE — Assessment & Plan Note (Signed)
Chronic, uncontrolled. On 5 different meds. Increase spironolactone to 50mg  bid, return next week for recheck K and then again in 1 month for ov.   Advised to bring all meds to appt.

## 2011-04-06 NOTE — Telephone Encounter (Signed)
i do not remember this.  Can check next visit in 1 month or I can take a look when he returns for labwork (just a quick look and if needed can convert into office visit).

## 2011-04-06 NOTE — Telephone Encounter (Signed)
Left message for patient to call back  

## 2011-04-08 NOTE — Telephone Encounter (Signed)
Message left for patient to return my call.  

## 2011-04-10 ENCOUNTER — Other Ambulatory Visit (INDEPENDENT_AMBULATORY_CARE_PROVIDER_SITE_OTHER): Payer: Managed Care, Other (non HMO)

## 2011-04-10 ENCOUNTER — Other Ambulatory Visit: Payer: Managed Care, Other (non HMO)

## 2011-04-10 DIAGNOSIS — M7989 Other specified soft tissue disorders: Secondary | ICD-10-CM

## 2011-04-10 DIAGNOSIS — N259 Disorder resulting from impaired renal tubular function, unspecified: Secondary | ICD-10-CM

## 2011-04-10 DIAGNOSIS — M799 Soft tissue disorder, unspecified: Secondary | ICD-10-CM

## 2011-04-10 DIAGNOSIS — I10 Essential (primary) hypertension: Secondary | ICD-10-CM

## 2011-04-10 LAB — BASIC METABOLIC PANEL
BUN: 19 mg/dL (ref 6–23)
CO2: 26 mEq/L (ref 19–32)
Calcium: 9.3 mg/dL (ref 8.4–10.5)
Creatinine, Ser: 1.7 mg/dL — ABNORMAL HIGH (ref 0.4–1.5)
Glucose, Bld: 129 mg/dL — ABNORMAL HIGH (ref 70–99)
Sodium: 140 mEq/L (ref 135–145)

## 2011-04-10 NOTE — Progress Notes (Addendum)
Longstanding L dorsal wrist cyst, previously thought to be ganglion cyst.  Over last several weeks has been growing in size, more tender.  Affects ability to type on keyboard.  Thinks has been growing since less active at wrist.  PE: L dorsal radial wrist with 2.5cm soft tissue swelling, nontender, not mobile or fluctuant, soft, no overlying erythema/edema

## 2011-04-10 NOTE — Progress Notes (Signed)
Addended by: Eustaquio Boyden on: 04/10/2011 01:06 PM   Modules accepted: Orders

## 2011-04-10 NOTE — Telephone Encounter (Signed)
Patient notified at lab appt and Dr. Reece Agar looked at area.

## 2011-04-15 ENCOUNTER — Telehealth: Payer: Self-pay | Admitting: *Deleted

## 2011-04-15 NOTE — Telephone Encounter (Signed)
Patient return your call and ended up on my voicemail. He said to leave the info on his voicemail if you can. If not, leave another message and he will try to call you back.

## 2011-05-05 ENCOUNTER — Ambulatory Visit: Payer: Managed Care, Other (non HMO) | Admitting: Family Medicine

## 2011-05-15 ENCOUNTER — Ambulatory Visit: Payer: Managed Care, Other (non HMO) | Admitting: Family Medicine

## 2011-05-29 ENCOUNTER — Encounter: Payer: Self-pay | Admitting: Family Medicine

## 2011-05-29 ENCOUNTER — Ambulatory Visit: Payer: Managed Care, Other (non HMO) | Admitting: Family Medicine

## 2011-05-29 ENCOUNTER — Ambulatory Visit (INDEPENDENT_AMBULATORY_CARE_PROVIDER_SITE_OTHER): Payer: Managed Care, Other (non HMO) | Admitting: Family Medicine

## 2011-05-29 VITALS — BP 160/108 | HR 88 | Temp 97.9°F | Wt >= 6400 oz

## 2011-05-29 DIAGNOSIS — I5020 Unspecified systolic (congestive) heart failure: Secondary | ICD-10-CM

## 2011-05-29 DIAGNOSIS — N259 Disorder resulting from impaired renal tubular function, unspecified: Secondary | ICD-10-CM

## 2011-05-29 DIAGNOSIS — E119 Type 2 diabetes mellitus without complications: Secondary | ICD-10-CM

## 2011-05-29 DIAGNOSIS — R590 Localized enlarged lymph nodes: Secondary | ICD-10-CM

## 2011-05-29 DIAGNOSIS — I10 Essential (primary) hypertension: Secondary | ICD-10-CM

## 2011-05-29 DIAGNOSIS — R599 Enlarged lymph nodes, unspecified: Secondary | ICD-10-CM

## 2011-05-29 DIAGNOSIS — Z23 Encounter for immunization: Secondary | ICD-10-CM

## 2011-05-29 LAB — POCT URINALYSIS DIPSTICK
Bilirubin, UA: NEGATIVE
Ketones, UA: NEGATIVE
Leukocytes, UA: NEGATIVE
pH, UA: 6

## 2011-05-29 NOTE — Progress Notes (Signed)
Subjective:    Patient ID: Randy [name], male    DOB: [birth year], 40 y.o.   MRN: [id number]  HPI CC: f/u HTN  Compliant with meds.  Consistently elevated blood pressure.  Has had malignant htn evaluation during hospitalization 2010 (see below).  Denies HA, vision changes, CP/tightness, SOB, leg swelling.  States feeling the best he's felt in a long time.  Wt Readings from Last 3 Encounters:  05/29/11 410 lb 8 oz (186.202 kg)  04/03/11 410 lb 12 oz (186.315 kg)  11/28/10 396 lb (179.624 kg)  states on home scale lost 6 lbs.  bp at home running 160/100s.  Does swim, starting walking on treadmill 68mi 3x/wk.  No chest pain, tightness with this.  Wants to jog.  Drinking plenty of water.  Fruits/vegetables daily.  Blending juice in am.  No salt.  DM - diet controlled. Lab Results  Component Value Date   HGBA1C 6.6* 04/03/2011   CRI - stage 3. Avoids NSAIDs.  Current Outpatient Prescriptions on File Prior to Visit  Medication Sig Dispense Refill  . hydrALAZINE (APRESOLINE) 50 MG tablet Take 1 tablet (50 mg total) by mouth 3 (three) times daily.  90 tablet  3  . labetalol (NORMODYNE) 300 MG tablet Take 1 tablet (300 mg total) by mouth 2 (two) times daily.  60 tablet  3  . losartan (COZAAR) 100 MG tablet Take 1 tablet (100 mg total) by mouth daily.  90 tablet  3  . spironolactone (ALDACTONE) 50 MG tablet Take 1 tablet (50 mg total) by mouth 2 (two) times daily.  60 tablet  11  and lasix 20mg  daily.  Past Medical History  Diagnosis Date  . HTN (hypertension), malignant     previously on BC and goody powders for HA  . Diabetes mellitus 2012    dx hospitalization with A1c 6.6%  . CKD (chronic kidney disease) stage 3, GFR 30-59 ml/min     baseline Cr 1.5-1.7  . Decreased visual acuity     Left eye, resolved  . Bell's palsy 9/09    Left  . Morbid obesity   . Internal derangement of knee 9/09    Left  . History of headache   . Systolic murmur     states known murmur  .  Proteinuria   . Vitamin d deficiency    Past Surgical History  Procedure Date  . Hospitalization 11/2008    malignant HTN, nl SPEP/UPEP, neg ANCA panel, nl C3/4, neg anti GBM Ab, neg Hep A/B/C, nl renal US, nl PTH, neg HIV  . US echocardiography 11/2008    LVsys fxn EF 50%, mild MR, normal LV size, neg ANA, neg cryoglobulins    Review of Systems Per HPI    Objective:   Physical Exam  Nursing note and vitals reviewed. Constitutional: He is oriented to person, place, and time. He appears well-developed and well-nourished. No distress.  HENT:  Head: Normocephalic and atraumatic.  Right Ear: External ear normal.  Left Ear: External ear normal.  Nose: Nose normal.  Mouth/Throat: Oropharynx is clear and moist. No oropharyngeal exudate.  Eyes: Conjunctivae and EOM are normal. Pupils are equal, round, and reactive to light. No scleral icterus.  Neck: Normal range of motion. Neck supple. Carotid bruit is not present.  Cardiovascular: Normal rate, regular rhythm and intact distal pulses.   Murmur (2/6 SEM) heard. Pulses:      Radial pulses are 2+ on the right side, and 2+ on the left side.  Pulmonary/Chest: Effort  normal and breath sounds normal. No respiratory distress. He has no wheezes. He has no rales.  Abdominal: Soft. Bowel sounds are normal. There is no tenderness.       No abd/renal bruit  Musculoskeletal: He exhibits edema (tr pedal edema).  Lymphadenopathy:    He has no cervical adenopathy.  Neurological: He is alert and oriented to person, place, and time.  Skin: Skin is warm and dry. No rash noted.       Assessment & Plan:

## 2011-05-29 NOTE — Assessment & Plan Note (Addendum)
BP Readings from Last 3 Encounters:  05/29/11 160/108  04/03/11 162/118  12/05/10 160/110  will increase lasix to bid. Discussed hesitance to give clear for jogging/running with such elevated BP. rec continued walking for now.   Discussed refefrral to cards for ETT to monitor BP response to exercise, pt declines now. Consider addition of CCB (dilt for anti-proteinuric effect) next visit. If bp not improving with lasix bid, will refer to cards for resistant HTN eval (currently on 5 meds for HTN)

## 2011-05-29 NOTE — Patient Instructions (Addendum)
Good to see you today. Blood work today Increase lasix to 20 mg 1 pill twice. Return in 1-2 month for follow up.

## 2011-05-30 ENCOUNTER — Encounter: Payer: Self-pay | Admitting: Family Medicine

## 2011-05-30 DIAGNOSIS — R590 Localized enlarged lymph nodes: Secondary | ICD-10-CM | POA: Insufficient documentation

## 2011-05-30 LAB — RENAL FUNCTION PANEL
Albumin: 4.6 g/dL (ref 3.5–5.2)
BUN: 19 mg/dL (ref 6–23)
Chloride: 104 mEq/L (ref 96–112)
Creat: 1.59 mg/dL — ABNORMAL HIGH (ref 0.50–1.35)
Phosphorus: 3.4 mg/dL (ref 2.3–4.6)

## 2011-05-30 MED ORDER — FUROSEMIDE 20 MG PO TABS: 20.0000 mg | ORAL_TABLET | Freq: Two times a day (BID) | ORAL | Status: DC

## 2011-05-30 NOTE — Assessment & Plan Note (Signed)
Will need f/u CT scan scheduled.  Will discuss at f/u visit.  R/o sarcoidosis.

## 2011-05-30 NOTE — Assessment & Plan Note (Signed)
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 Eden Emms during hospitalization 02/2010 for HTN urgency.

## 2011-05-30 NOTE — Assessment & Plan Note (Addendum)
microalb today -> elevated. Already on max dose ARB.  Consider addition of dilt for anti-proteinuric effect vs addition of ACEI (no known contraindication?) Lab Results  Component Value Date   HGBA1C 6.6* 04/03/2011

## 2011-05-30 NOTE — Assessment & Plan Note (Signed)
Recheck Cr today given change in spironolactone last visit.

## 2011-07-03 ENCOUNTER — Other Ambulatory Visit: Payer: Self-pay | Admitting: *Deleted

## 2011-07-03 MED ORDER — LOSARTAN POTASSIUM 100 MG PO TABS: 100.0000 mg | ORAL_TABLET | Freq: Every day | ORAL | Status: DC

## 2011-07-10 ENCOUNTER — Ambulatory Visit: Payer: Managed Care, Other (non HMO) | Admitting: Family Medicine

## 2011-07-17 ENCOUNTER — Ambulatory Visit (INDEPENDENT_AMBULATORY_CARE_PROVIDER_SITE_OTHER): Payer: Managed Care, Other (non HMO) | Admitting: Family Medicine

## 2011-07-17 ENCOUNTER — Encounter: Payer: Self-pay | Admitting: Family Medicine

## 2011-07-17 VITALS — BP 160/102 | HR 80 | Temp 98.8°F | Wt >= 6400 oz

## 2011-07-17 DIAGNOSIS — R590 Localized enlarged lymph nodes: Secondary | ICD-10-CM

## 2011-07-17 DIAGNOSIS — N058 Unspecified nephritic syndrome with other morphologic changes: Secondary | ICD-10-CM

## 2011-07-17 DIAGNOSIS — N189 Chronic kidney disease, unspecified: Secondary | ICD-10-CM

## 2011-07-17 DIAGNOSIS — I130 Hypertensive heart and chronic kidney disease with heart failure and stage 1 through stage 4 chronic kidney disease, or unspecified chronic kidney disease: Secondary | ICD-10-CM

## 2011-07-17 DIAGNOSIS — E1129 Type 2 diabetes mellitus with other diabetic kidney complication: Secondary | ICD-10-CM

## 2011-07-17 MED ORDER — SPIRONOLACTONE 50 MG PO TABS: 50.0000 mg | ORAL_TABLET | Freq: Two times a day (BID) | ORAL | Status: DC

## 2011-07-17 NOTE — Assessment & Plan Note (Signed)
Currently diet controlled. Consider addition of dilt for anti-proteinuric effect.

## 2011-07-17 NOTE — Assessment & Plan Note (Signed)
bp unchanged - did not increase lasix as directed. rec do this and monitor. If not improving, consider changing from labetalol to coreg given CHF and referral to cards for resistant HTN (has had malignant HTN w/u 2010). Would start at corec 6.25mg  bid.

## 2011-07-17 NOTE — Progress Notes (Signed)
  Subjective:    Patient ID: Randy [name], male    DOB: [birth year], 41 y.o.   MRN: [id number]  HPI CC: f/u HTN  Reports compliance with bp meds however did not increase lasix to 20mg  bid as directed last visit.    Denies h/o CHF, echo 2011 with EF 40%, severe LVH and dilated LA with increased PA pressures.  Unaware of h/o hilar adenopathy.  Never f/u with CT scan, never told had to f/u for this.  No known h/o OSA.  HTN - No HA, vision changes, CP/tightness, SOB, leg swelling.  On labetalol for several years now.  Denies PNDyspnea or orthopnea.  Wt Readings from Last 3 Encounters:  07/17/11 416 lb 4 oz (188.81 kg)  05/29/11 410 lb 8 oz (186.202 kg)  04/03/11 410 lb 12 oz (186.315 kg)    Past Medical History  Diagnosis Date  . HTN (hypertension), malignant     previously on BC and goody powders for HA  . Diabetes mellitus 2012    dx hospitalization with A1c 6.6%  . CKD (chronic kidney disease) stage 3, GFR 30-59 ml/min     baseline Cr 1.5-1.7  . Decreased visual acuity     Left eye, resolved - hypertensive retinopathy  . History of Bell's palsy 9/09    history, Left  . Morbid obesity   . Internal derangement of knee 9/09    Left  . History of headache   . Systolic murmur   . Microalbuminuria   . Vitamin d deficiency   . 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 Hilar adenopathy     on CT scan 02/2010, rec rpt 6 mo   Past Surgical History  Procedure Date  . Hospitalization 11/2008    malignant HTN, nl SPEP/UPEP, neg ANCA panel, nl C3/4, neg anti GBM Ab, neg Hep A/B/C, nl renal US, nl PTH, neg HIV  . US echocardiography 11/2008    LVsys fxn EF 50%, mild MR, normal LV size, neg ANA, neg cryoglobulins  . US echocardiography 2011    severe LVH, EF 40%, LA mildly dilated, PA pressure moderately increased     Review of Systems Per HPI    Objective:   Physical Exam  Nursing note and vitals reviewed. Constitutional: He appears  well-developed and well-nourished. No distress.       Obese  HENT:  Head: Normocephalic and atraumatic.  Mouth/Throat: Oropharynx is clear and moist. No oropharyngeal exudate.  Eyes: Conjunctivae and EOM are normal. Pupils are equal, round, and reactive to light. No scleral icterus.  Neck: Normal range of motion. Neck supple.  Cardiovascular: Normal rate, regular rhythm, normal heart sounds and intact distal pulses.   No murmur heard. Pulmonary/Chest: Effort normal and breath sounds normal. No respiratory distress. He has no wheezes. He has no rales.  Musculoskeletal: He exhibits no edema (nonpitting edema).  Skin: Skin is warm and dry. No rash noted.  Psychiatric: He has a normal mood and affect.       Assessment & Plan:

## 2011-07-17 NOTE — Assessment & Plan Note (Signed)
Schedule rpt CT scan to r/o sarcoid.

## 2011-07-17 NOTE — Patient Instructions (Signed)
Pass by Marion's office to repeat chest CT scan. Take lasix 2 times a day (8am and 2pm). Good to see you today, call us with questions. Return to see me in 2-3 months.

## 2011-07-21 ENCOUNTER — Other Ambulatory Visit: Payer: Self-pay | Admitting: *Deleted

## 2011-07-21 MED ORDER — SPIRONOLACTONE 50 MG PO TABS: 50.0000 mg | ORAL_TABLET | Freq: Two times a day (BID) | ORAL | Status: DC

## 2011-07-21 NOTE — Telephone Encounter (Signed)
Patient states pharmacy never received previous refill.

## 2011-07-22 ENCOUNTER — Telehealth: Payer: Self-pay

## 2011-07-22 NOTE — Telephone Encounter (Signed)
Pt left v/m requesting a return call. I called [phone] and left v/m for pt to call back.

## 2011-07-23 NOTE — Telephone Encounter (Signed)
pts job wants him to change his schedule. But pts work said needs a doctors note to allow pt to go to lunch at 5pm instead of 2 pm and pt works until 10 pm so can keep better eating schedule while taking BP med. Pt would like call back when note is ready at [phone].

## 2011-07-23 NOTE — Telephone Encounter (Signed)
Left vm for pt to callback 

## 2011-07-27 NOTE — Telephone Encounter (Signed)
Patient notified and letter placed up front for pick up. 

## 2011-07-27 NOTE — Telephone Encounter (Signed)
Printed note and placed in Kim's box.

## 2011-07-30 ENCOUNTER — Telehealth: Payer: Self-pay | Admitting: *Deleted

## 2011-07-30 DIAGNOSIS — N259 Disorder resulting from impaired renal tubular function, unspecified: Secondary | ICD-10-CM

## 2011-07-30 NOTE — Telephone Encounter (Signed)
Rose called and said that patient is scheduled for CT with contrast tomorrow (07-31-11) at 4:00. She was looking in his chart and saw his renal insufficiency diagnosis and DM diagnosis. She said if he needs contrast he will need labs prior. (When he was scheduled they were told he was not a diabetic but his chart says he is) Does he need contrast or is it ok to do it without? I advised I would call her tomorrow.

## 2011-07-31 ENCOUNTER — Other Ambulatory Visit: Payer: Managed Care, Other (non HMO)

## 2011-07-31 ENCOUNTER — Ambulatory Visit: Payer: Managed Care, Other (non HMO) | Admitting: Family Medicine

## 2011-07-31 NOTE — Telephone Encounter (Signed)
Ok to do IV contrast, but please ask to minimize contrast use, and have pt hold cozaar for next 3 days and push fluids.

## 2011-07-31 NOTE — Telephone Encounter (Signed)
Message left for patient advising that he needs labs prior to CT. Advised to call me back to schedule lab appt.

## 2011-07-31 NOTE — Telephone Encounter (Signed)
Rose called back and said patient changed appt until the 17th. She said he will need labs prior to CT. Please place order in chart and I will notify patient.

## 2011-07-31 NOTE — Telephone Encounter (Signed)
Placed order in chart.

## 2011-07-31 NOTE — Telephone Encounter (Signed)
Notified Rose at CT to minimize contrast use. Message left notifying patient to drink plenty of fluids and to hold cozaar x 3 days. Will try to reach patient later to confirm that he understood message.

## 2011-08-03 ENCOUNTER — Ambulatory Visit: Payer: Managed Care, Other (non HMO) | Admitting: Family Medicine

## 2011-08-07 ENCOUNTER — Ambulatory Visit: Payer: Managed Care, Other (non HMO) | Admitting: Family Medicine

## 2011-08-11 ENCOUNTER — Telehealth: Payer: Self-pay | Admitting: *Deleted

## 2011-08-11 NOTE — Telephone Encounter (Signed)
Patient called and left message stating when he spoke with CT about labs that were needed, they told him he needed it because he was diabetic. He said he has never been told he was diabetic. I see where his A1c has been checked a few times and was slightly elevated but stable. I see DM II on problem list as well, but couldn't find where he had been notified of the Dx. He is asking if he should be on meds/concerned since it's never been mentioned to him.

## 2011-08-12 ENCOUNTER — Other Ambulatory Visit: Payer: Managed Care, Other (non HMO)

## 2011-08-12 ENCOUNTER — Other Ambulatory Visit (INDEPENDENT_AMBULATORY_CARE_PROVIDER_SITE_OTHER): Payer: Managed Care, Other (non HMO)

## 2011-08-12 DIAGNOSIS — N259 Disorder resulting from impaired renal tubular function, unspecified: Secondary | ICD-10-CM

## 2011-08-12 LAB — BASIC METABOLIC PANEL
BUN: 19 mg/dL (ref 6–23)
GFR: 62.34 mL/min (ref >60.00)
Glucose, Bld: 139 mg/dL — ABNORMAL HIGH (ref 70–99)
Potassium: 4 mEq/L (ref 3.5–5.1)

## 2011-08-12 NOTE — Telephone Encounter (Signed)
We last talked about this 11/2010.  At that time discussed how A1c was elevated during hospitalization 02/2010 but currently controlled so does not need additional meds. On recheck A1c staying 6.6%, which is diabetes range but still controlled (goal <7%)

## 2011-08-12 NOTE — Telephone Encounter (Signed)
Spoke with patient and notified him. He remembered the conversation after I spoke to him. He just wanted to make sure he didn't need meds. I advised since his A1c was stable and < 7%, that no meds were needed. Encouraged to watch diet and exercise to avoid meds in the future. He verbalized understanding.

## 2011-08-14 ENCOUNTER — Inpatient Hospital Stay
Admission: RE | Admit: 2011-08-14 | Payer: Managed Care, Other (non HMO) | Source: Ambulatory Visit | Attending: Family Medicine | Admitting: Family Medicine

## 2011-08-21 ENCOUNTER — Ambulatory Visit (INDEPENDENT_AMBULATORY_CARE_PROVIDER_SITE_OTHER)
Admission: RE | Admit: 2011-08-21 | Discharge: 2011-08-21 | Disposition: A | Discharge status: Home / Self Care | Payer: Managed Care, Other (non HMO) | Source: Ambulatory Visit | Attending: Family Medicine | Admitting: Family Medicine

## 2011-08-21 DIAGNOSIS — R590 Localized enlarged lymph nodes: Secondary | ICD-10-CM

## 2011-08-21 DIAGNOSIS — R599 Enlarged lymph nodes, unspecified: Secondary | ICD-10-CM

## 2011-08-21 MED ORDER — IOHEXOL 300 MG/ML  SOLN
80.0000 mL | Freq: Once | INTRAMUSCULAR | Status: AC | PRN
  Administered 2011-08-21: 80 mL via INTRAVENOUS

## 2011-08-24 ENCOUNTER — Telehealth: Payer: Self-pay | Admitting: *Deleted

## 2011-08-24 ENCOUNTER — Encounter: Payer: Self-pay | Admitting: *Deleted

## 2011-08-24 NOTE — Telephone Encounter (Signed)
Patient calling requesting CT results. °

## 2011-08-24 NOTE — Telephone Encounter (Signed)
Called to speak with pt - chest CT with multiple lymph nodes identified but none swollen.  Some borderline sizes.  Overall smaller than on prior CT scan.  Likely benign finding. Unable to reach him, left message on answering machine

## 2011-08-25 NOTE — Telephone Encounter (Signed)
Patient returned your call.

## 2011-08-25 NOTE — Telephone Encounter (Signed)
Called and spoke with pt

## 2011-09-03 ENCOUNTER — Telehealth: Payer: Self-pay | Admitting: Family Medicine

## 2011-09-03 NOTE — Telephone Encounter (Signed)
Patient states that he had a CT done a couple of weeks ago and ever since then the tip of one finger on his left hand has been numb. Patient would like to know what would cause this and if he needs an appointment?

## 2011-09-04 NOTE — Telephone Encounter (Signed)
Spoke with patient. He said that since CT he has had numbness to his finger and no where else. I questioned if an IV was placed in that arm/hand for his CT. He confirmed that was the case. Advised that it was possibly some nerve irritation from the IV placement since it was localized to the finger and started after CT. I advised it should get better with time once the nerve is less irritated. He said he never made it to the hand doctor about the knot on his wrist and wonders if that could be causing it. I advised that it could, but since it all started after CT, I thought it more likely coming from the IV. I advised that since the nerves can't be visualized, that it was an unfortunate complication/risk of IV's and venipunctures. He verbalized understanding and will call back if it doesn't get better or if it gets worse.

## 2011-09-06 NOTE — Telephone Encounter (Signed)
Noted. Agree - to come in for eval if not improving or any worsening.

## 2011-09-14 ENCOUNTER — Telehealth: Payer: Self-pay | Admitting: *Deleted

## 2011-09-14 NOTE — Telephone Encounter (Signed)
Noted  

## 2011-09-14 NOTE — Telephone Encounter (Signed)
Initially patient had numbness to 1 fingertip after CT. Advised possibly some nerve irritation from IV and should improve with time. No improvement- actually worse. Now the pointer,middle and ring finger tips of his hand are numb. He said he had to miss work today due to not being able to feel his fingers to type. Offered 8:00 AM appt tomorrow and pt declined stating he had to be at work by 9. He made appt for Friday for eval. He was requesting work note for today. Advised could not provide work note without seeing him first. He verbalized understanding.

## 2011-09-18 ENCOUNTER — Encounter: Payer: Self-pay | Admitting: Family Medicine

## 2011-09-18 ENCOUNTER — Encounter: Payer: Self-pay | Admitting: *Deleted

## 2011-09-18 ENCOUNTER — Ambulatory Visit (INDEPENDENT_AMBULATORY_CARE_PROVIDER_SITE_OTHER): Payer: Managed Care, Other (non HMO) | Admitting: Family Medicine

## 2011-09-18 VITALS — BP 144/96 | HR 84 | Temp 98.3°F | Wt >= 6400 oz

## 2011-09-18 DIAGNOSIS — E1129 Type 2 diabetes mellitus with other diabetic kidney complication: Secondary | ICD-10-CM

## 2011-09-18 DIAGNOSIS — I130 Hypertensive heart and chronic kidney disease with heart failure and stage 1 through stage 4 chronic kidney disease, or unspecified chronic kidney disease: Secondary | ICD-10-CM

## 2011-09-18 DIAGNOSIS — R590 Localized enlarged lymph nodes: Secondary | ICD-10-CM

## 2011-09-18 DIAGNOSIS — R200 Anesthesia of skin: Secondary | ICD-10-CM

## 2011-09-18 DIAGNOSIS — R209 Unspecified disturbances of skin sensation: Secondary | ICD-10-CM

## 2011-09-18 NOTE — Patient Instructions (Addendum)
I do think this is component of carpal tunnel going on. Treat with tylenol , use left wrist brace provided today for night time to prevent awkward hand motions. If not better, let me know for referral to hand doctor. Call us with questions. Return to see me in 1-2 months for follow up.

## 2011-09-18 NOTE — Progress Notes (Signed)
Subjective:    Patient ID: Randy [name], male    DOB: 01/12/1971, 41 y.o.   MRN: [id number]  HPI CC: left finger numbness  Started after CT scan 4/26.  Started with left index finger, then spread to 3 middle fingers at tips, then returned just at index finger.  numbness both anterior and posterior of tip of finger.  Never thumb or 5th finger involvement.  Does have ganglion cyst on dorsum of wrist, nontender, never really bothered him.  Some discomfort at carpal tunnel on left.  Has self treated with icing finger then heat.  No meds tried for this.  IV placed at left [facility] fossa for CT scan.  No numbness prior to CT scan.  Denies other numbness, denies neck pain, denies weakness of arm, fevers/chills.  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  Affecting work, some trouble typing.  Medications and allergies reviewed and updated in chart.  Past histories reviewed and updated if relevant as below. Patient Active Problem List  Diagnoses  . Morbid obesity  . BELL'S PALSY, LEFT  . VISUAL ACUITY, DECREASED, LEFT EYE  . 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 RENAL INSUFFICIENCY  . 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 Systolic CHF  . Hilar adenopathy  . Numbness of finger   Past Medical History  Diagnosis Date  . HTN (hypertension), malignant     previously on BC and goody powders for HA  . Diabetes mellitus 2012    dx hospitalization with A1c 6.6%  . CKD (chronic kidney disease) stage 3, GFR 30-59 ml/min     baseline Cr 1.5-1.7  . Decreased visual acuity     Left eye, resolved - hypertensive retinopathy  . History of Bell's palsy 9/09    history, Left  . Morbid obesity   . Internal derangement of knee 9/09    Left  . History of headache   . Systolic murmur   . Microalbuminuria   . Vitamin d deficiency   . 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 Hilar  adenopathy     on CT scan 02/2010, on rpt scan stable/improved.   Past Surgical History  Procedure Date  . Hospitalization 11/2008    malignant HTN, nl SPEP/UPEP, neg ANCA panel, nl C3/4, neg anti GBM Ab, neg Hep A/B/C, nl renal US, nl PTH, neg HIV  . US echocardiography 11/2008    LVsys fxn EF 50%, mild MR, normal LV size, neg ANA, neg cryoglobulins  . US echocardiography 2011    severe LVH, EF 40%, LA mildly dilated, PA pressure moderately increased   History  Substance Use Topics  . Smoking status: Never Smoker   . Smokeless tobacco: Never Used   Comment: monthly, when going out  . Alcohol Use: Yes     Occasional   Family History  Problem Relation Age of Onset  . Hypertension Mother   . Hypertension Father   . Diabetes Father   . Hypertension Brother   . Diabetes Sister   . Coronary artery disease Neg Hx   . Stroke Neg Hx   . Cancer Neg Hx   . Kidney disease Paternal Grandmother     ESRD   Allergies  Allergen Reactions  . Fish-Derived Products Swelling    seafood  . Iodine    Current Outpatient Prescriptions on File Prior to Visit  Medication Sig Dispense Refill  .  furosemide (LASIX) 20 MG tablet Take 1 tablet (20 mg total) by mouth 2 (two) times daily.  180 tablet  3  . hydrALAZINE (APRESOLINE) 50 MG tablet Take 1 tablet (50 mg total) by mouth 3 (three) times daily.  90 tablet  3  . labetalol (NORMODYNE) 300 MG tablet Take 1 tablet (300 mg total) by mouth 2 (two) times daily.  60 tablet  3  . losartan (COZAAR) 100 MG tablet Take 1 tablet (100 mg total) by mouth daily.  30 tablet  6  . spironolactone (ALDACTONE) 50 MG tablet Take 1 tablet (50 mg total) by mouth 2 (two) times daily.  60 tablet  11     Review of Systems Per HPI    Objective:   Physical Exam  Nursing note and vitals reviewed. Constitutional: He appears well-developed and well-nourished. No distress.  Musculoskeletal:       FROM of fingers, wrist, elbow. No pain to palpation entire forearm, no  tenderness or swelling appreciated with palpation at MCP, PIP, or DIP joints. mildly tender to palpation anterior wrist. No erythema, warmth or significant swelling.  Neurological: He has normal strength. He displays no atrophy. A sensory deficit is present. He exhibits normal muscle tone.       Decreased but symmetric DTRs BUE Full strength of intrinsics, full strength of digit and wrist flexors and extensors Temperature and light touch sensation mildly diminished at entire left index finger distal to DIP. Negative phalen. Positive tinel sign on left.        Assessment & Plan:

## 2011-09-19 DIAGNOSIS — R200 Anesthesia of skin: Secondary | ICD-10-CM | POA: Insufficient documentation

## 2011-09-19 NOTE — Assessment & Plan Note (Signed)
Lab Results  Component Value Date   HGBA1C 6.6* 04/03/2011  again discussed dx diabetes, currently controlled but if A1c increasing discussed low threshold to start medication. ?dilt for anti-proteinuric effect

## 2011-09-19 NOTE — Assessment & Plan Note (Signed)
Anticipate due to carpal tunnel syndrome. Placed in wrist brace for night time use. rec tylenol (avoid NSAIDs given CRI). If not better, referral to hand surgery for further evaluation.

## 2011-09-19 NOTE — Assessment & Plan Note (Signed)
bp much improved but still above goal. No changes today. BP Readings from Last 3 Encounters:  09/18/11 144/96  07/17/11 160/102  05/29/11 160/108

## 2011-09-28 ENCOUNTER — Ambulatory Visit (INDEPENDENT_AMBULATORY_CARE_PROVIDER_SITE_OTHER)
Admission: RE | Admit: 2011-09-28 | Discharge: 2011-09-28 | Disposition: A | Discharge status: Home / Self Care | Payer: Managed Care, Other (non HMO) | Source: Ambulatory Visit | Attending: Family Medicine | Admitting: Family Medicine

## 2011-09-28 ENCOUNTER — Ambulatory Visit (INDEPENDENT_AMBULATORY_CARE_PROVIDER_SITE_OTHER): Payer: Managed Care, Other (non HMO) | Admitting: Family Medicine

## 2011-09-28 ENCOUNTER — Encounter: Payer: Self-pay | Admitting: Family Medicine

## 2011-09-28 VITALS — BP 158/96 | HR 80 | Temp 98.5°F | Ht 78.0 in | Wt >= 6400 oz

## 2011-09-28 DIAGNOSIS — M25579 Pain in unspecified ankle and joints of unspecified foot: Secondary | ICD-10-CM

## 2011-09-28 DIAGNOSIS — S93409A Sprain of unspecified ligament of unspecified ankle, initial encounter: Secondary | ICD-10-CM

## 2011-09-28 DIAGNOSIS — M25571 Pain in right ankle and joints of right foot: Secondary | ICD-10-CM

## 2011-09-28 NOTE — Progress Notes (Signed)
aircast large, R  S: Randy [name] is a 41 y.o. male who complains of inversion injury to the right ankle 5 days ago. There is pain and swelling at the lateral aspect of that ankle. The patient was able to bear weight directly after the injury. limping  O: He appears well, vital signs are normal. There is swelling and tenderness over the at ATFL. No tenderness over the medial aspect of the ankle. The fifth metatarsal is not tender. The ankle joint is intact without excessive opening on stressing. X-Ray shows fracture to be absent. The rest of the foot, ankle and leg exam is normal. Kleiger +  A: Sprain of ankle, ATFL and high, mild  P: Rest and elevate the injured ankle, apply ice intermittently. Use crutches without weight bearing until able to comfortable bear partial weight, then progress to full weight bearing as tolerated. See prn.  Large aircast

## 2011-11-16 ENCOUNTER — Other Ambulatory Visit: Payer: Self-pay | Admitting: Family Medicine

## 2011-11-17 ENCOUNTER — Other Ambulatory Visit: Payer: Self-pay | Admitting: Family Medicine

## 2011-11-18 ENCOUNTER — Other Ambulatory Visit: Payer: Self-pay

## 2011-11-18 NOTE — Telephone Encounter (Signed)
Pt wanted to check on status of refills for hydralazine and labetolol. Have been sent to [facility]. Pt will call [facility] if rx ready for pick up.

## 2011-11-25 ENCOUNTER — Telehealth: Payer: Self-pay | Admitting: *Deleted

## 2011-11-25 DIAGNOSIS — R200 Anesthesia of skin: Secondary | ICD-10-CM

## 2011-11-25 NOTE — Telephone Encounter (Signed)
Placed referral to hand.

## 2011-11-25 NOTE — Telephone Encounter (Signed)
Patient called and said he woke up with numbness to his left hand. He requests the surgical consult now.

## 2011-12-01 ENCOUNTER — Emergency Department (HOSPITAL_BASED_OUTPATIENT_CLINIC_OR_DEPARTMENT_OTHER)
Admission: EM | Admit: 2011-12-01 | Discharge: 2011-12-01 | Disposition: A | Discharge status: Home / Self Care | Payer: Worker's Compensation | Attending: Emergency Medicine | Admitting: Emergency Medicine

## 2011-12-01 ENCOUNTER — Encounter (HOSPITAL_BASED_OUTPATIENT_CLINIC_OR_DEPARTMENT_OTHER): Payer: Self-pay | Admitting: *Deleted

## 2011-12-01 ENCOUNTER — Emergency Department (HOSPITAL_BASED_OUTPATIENT_CLINIC_OR_DEPARTMENT_OTHER): Payer: Worker's Compensation

## 2011-12-01 DIAGNOSIS — Y9289 Other specified places as the place of occurrence of the external cause: Secondary | ICD-10-CM | POA: Insufficient documentation

## 2011-12-01 DIAGNOSIS — W108XXA Fall (on) (from) other stairs and steps, initial encounter: Secondary | ICD-10-CM | POA: Insufficient documentation

## 2011-12-01 DIAGNOSIS — E119 Type 2 diabetes mellitus without complications: Secondary | ICD-10-CM | POA: Insufficient documentation

## 2011-12-01 DIAGNOSIS — N183 Chronic kidney disease, stage 3 unspecified: Secondary | ICD-10-CM | POA: Insufficient documentation

## 2011-12-01 DIAGNOSIS — I129 Hypertensive chronic kidney disease with stage 1 through stage 4 chronic kidney disease, or unspecified chronic kidney disease: Secondary | ICD-10-CM | POA: Insufficient documentation

## 2011-12-01 DIAGNOSIS — S39012A Strain of muscle, fascia and tendon of lower back, initial encounter: Secondary | ICD-10-CM

## 2011-12-01 DIAGNOSIS — IMO0002 Reserved for concepts with insufficient information to code with codable children: Secondary | ICD-10-CM | POA: Insufficient documentation

## 2011-12-01 DIAGNOSIS — Y9301 Activity, walking, marching and hiking: Secondary | ICD-10-CM | POA: Insufficient documentation

## 2011-12-01 DIAGNOSIS — E559 Vitamin D deficiency, unspecified: Secondary | ICD-10-CM | POA: Insufficient documentation

## 2011-12-01 DIAGNOSIS — Y990 Civilian activity done for income or pay: Secondary | ICD-10-CM | POA: Insufficient documentation

## 2011-12-01 MED ORDER — CYCLOBENZAPRINE HCL 10 MG PO TABS: 10.0000 mg | ORAL_TABLET | Freq: Two times a day (BID) | ORAL | Status: DC | PRN

## 2011-12-01 MED ORDER — TRAMADOL HCL 50 MG PO TABS: 50.0000 mg | ORAL_TABLET | Freq: Four times a day (QID) | ORAL | Status: DC | PRN

## 2011-12-01 NOTE — ED Provider Notes (Signed)
History     CSN: [id number]  Arrival date & time 12/01/11  1132   First MD Initiated Contact with Patient 12/01/11 1223      Chief Complaint  Patient presents with  . Fall  . Back Pain    (Consider location/radiation/quality/duration/timing/severity/associated sxs/prior treatment) HPI Comments: Patient complains of back pain related to a fall he had at work last night. He states he was walking down some steps and slipped. he tried to catch himself with his arms but he fell backward onto his lower back area. He's complaining of a constant throbbing pain across his lower lumbar spine which at times radiates around to his groin. Denies any radiation down his legs. Denies any numbness or weakness in his legs. Denies a loss of bowel or bladder function. Denies abdominal pain. Denies a history of back problems in the past. Denies any head injury neck pain or other injuries  Patient is a 41 y.o. male presenting with fall and back pain. The history is provided by the patient.  Fall Pertinent negatives include no fever, no numbness, no abdominal pain, no nausea, no vomiting, no hematuria and no headaches.  Back Pain  Pertinent negatives include no chest pain, no fever, no numbness, no headaches, no abdominal pain and no weakness.    Past Medical History  Diagnosis Date  . HTN (hypertension), malignant     previously on BC and goody powders for HA  . Diabetes mellitus 2012    dx hospitalization with A1c 6.6%  . CKD (chronic kidney disease) stage 3, GFR 30-59 ml/min     baseline Cr 1.5-1.7  . Decreased visual acuity     Left eye, resolved - hypertensive retinopathy  . History of Bell's palsy 9/09    history, Left  . Morbid obesity   . Internal derangement of knee 9/09    Left  . History of headache   . Systolic murmur   . Microalbuminuria   . Vitamin d deficiency   . Systolic CHF     echo 2011 with nonischemic hypertensive cardiomyopathy  . Hilar adenopathy     on CT scan 02/2010,  on rpt scan stable/improved.    Past Surgical History  Procedure Date  . Hospitalization 11/2008    malignant HTN, nl SPEP/UPEP, neg ANCA panel, nl C3/4, neg anti GBM Ab, neg Hep A/B/C, nl renal US, nl PTH, neg HIV  . US echocardiography 11/2008    LVsys fxn EF 50%, mild MR, normal LV size, neg ANA, neg cryoglobulins  . US echocardiography 2011    severe LVH, EF 40%, LA mildly dilated, PA pressure moderately increased    Family History  Problem Relation Age of Onset  . Hypertension Mother   . Hypertension Father   . Diabetes Father   . Hypertension Brother   . Diabetes Sister   . Coronary artery disease Neg Hx   . Stroke Neg Hx   . Cancer Neg Hx   . Kidney disease Paternal Grandmother     ESRD    History  Substance Use Topics  . Smoking status: Never Smoker   . Smokeless tobacco: Never Used   Comment: monthly, when going out  . Alcohol Use: Yes     Occasional      Review of Systems  Constitutional: Negative for fever, chills, diaphoresis and fatigue.  HENT: Negative for congestion, rhinorrhea and sneezing.   Eyes: Negative.   Respiratory: Negative for cough, chest tightness and shortness of breath.   Cardiovascular:  Negative for chest pain and leg swelling.  Gastrointestinal: Negative for nausea, vomiting, abdominal pain, diarrhea and blood in stool.  Genitourinary: Negative for frequency, hematuria, flank pain and difficulty urinating.  Musculoskeletal: Positive for back pain. Negative for arthralgias.  Skin: Negative for rash.  Neurological: Negative for dizziness, speech difficulty, weakness, numbness and headaches.    Allergies  Fish-derived products and Iodine  Home Medications   Current Outpatient Rx  Name Route Sig Dispense Refill  . CYCLOBENZAPRINE HCL 10 MG PO TABS Oral Take 1 tablet (10 mg total) by mouth 2 (two) times daily as needed for muscle spasms. 20 tablet 0  . FUROSEMIDE 20 MG PO TABS Oral Take 1 tablet (20 mg total) by mouth 2 (two) times  daily. 180 tablet 3  . HYDRALAZINE HCL 50 MG PO TABS  TAKE 1 TABLET BY MOUTH EVERY 8 HOURS 90 tablet 0  . LABETALOL HCL 300 MG PO TABS  TAKE 1 TABLET BY MOUTH 2 TIMES A DAY 60 tablet 6  . LOSARTAN POTASSIUM 100 MG PO TABS Oral Take 1 tablet (100 mg total) by mouth daily. 30 tablet 6  . SPIRONOLACTONE 50 MG PO TABS Oral Take 1 tablet (50 mg total) by mouth 2 (two) times daily. 60 tablet 11  . TRAMADOL HCL 50 MG PO TABS Oral Take 1 tablet (50 mg total) by mouth every 6 (six) hours as needed for pain. 15 tablet 0    BP 161/105  Pulse 83  Temp 98.1 F (36.7 C) (Oral)  Resp 16  Ht 6\' 6"  (1.981 m)  Wt 370 lb (167.831 kg)  BMI 42.76 kg/m2  SpO2 99%  Physical Exam  Constitutional: He is oriented to person, place, and time. He appears well-developed and well-nourished.  HENT:  Head: Normocephalic and atraumatic.  Eyes: Pupils are equal, round, and reactive to light.  Neck: Normal range of motion. Neck supple.  Cardiovascular: Normal rate, regular rhythm and normal heart sounds.   Pulmonary/Chest: Effort normal and breath sounds normal. No respiratory distress. He has no wheezes. He has no rales. He exhibits no tenderness.  Abdominal: Soft. Bowel sounds are normal. There is no tenderness. There is no rebound and no guarding.  Musculoskeletal: Normal range of motion. He exhibits no edema.       Moderate tenderness to the mid and lower lumbar spine and across the musculature in the lower back area. No step-offs or deformities are noted. No pain to the thoracic or cervical spine. Negative straight leg raise bilaterally. He has normal sensation and motor function in the legs. Normal pulses in the feet.  Lymphadenopathy:    He has no cervical adenopathy.  Neurological: He is alert and oriented to person, place, and time.  Skin: Skin is warm and dry. No rash noted.  Psychiatric: He has a normal mood and affect.    ED Course  Procedures (including critical care time)   Dg Lumbar Spine  Complete  12/01/2011  *RADIOLOGY REPORT*  Clinical Data: Fall, back pain.  LUMBAR SPINE - COMPLETE 4+ VIEW  Comparison: None.  Findings: There are five lumbar-type vertebral bodies.  No fracture or malalignment.  Disc spaces well maintained.  SI joints are symmetric.  IMPRESSION: No bony abnormality.  Original Report Authenticated By: Raelyn Number, M.D.      1. Back strain       MDM  Patient has no evidence of fracture. No neurologic deficits. He denies any for narcotic pain medicines. I did give him sharpshooter Ultram  and Flexeril and advised him to followup with his primary care provider if his symptoms do not improve within the next week        Malvin Johns, MD 12/01/11 1318

## 2011-12-01 NOTE — ED Notes (Signed)
Patient states he slipped and fell down 2 steps last night.  Now has pain in the lower back radiating into buttocks and anterior tights.  Using ice and otc excederin and advil pm last night with minimal relief.

## 2011-12-10 ENCOUNTER — Emergency Department (HOSPITAL_BASED_OUTPATIENT_CLINIC_OR_DEPARTMENT_OTHER): Payer: Worker's Compensation

## 2011-12-10 ENCOUNTER — Encounter (HOSPITAL_BASED_OUTPATIENT_CLINIC_OR_DEPARTMENT_OTHER): Payer: Self-pay | Admitting: *Deleted

## 2011-12-10 ENCOUNTER — Emergency Department (HOSPITAL_BASED_OUTPATIENT_CLINIC_OR_DEPARTMENT_OTHER)
Admission: EM | Admit: 2011-12-10 | Discharge: 2011-12-10 | Disposition: A | Discharge status: Home / Self Care | Payer: Worker's Compensation | Attending: Emergency Medicine | Admitting: Emergency Medicine

## 2011-12-10 DIAGNOSIS — M549 Dorsalgia, unspecified: Secondary | ICD-10-CM

## 2011-12-10 DIAGNOSIS — I5020 Unspecified systolic (congestive) heart failure: Secondary | ICD-10-CM | POA: Insufficient documentation

## 2011-12-10 DIAGNOSIS — I509 Heart failure, unspecified: Secondary | ICD-10-CM | POA: Insufficient documentation

## 2011-12-10 DIAGNOSIS — N183 Chronic kidney disease, stage 3 unspecified: Secondary | ICD-10-CM | POA: Insufficient documentation

## 2011-12-10 DIAGNOSIS — Z79899 Other long term (current) drug therapy: Secondary | ICD-10-CM | POA: Insufficient documentation

## 2011-12-10 DIAGNOSIS — I129 Hypertensive chronic kidney disease with stage 1 through stage 4 chronic kidney disease, or unspecified chronic kidney disease: Secondary | ICD-10-CM | POA: Insufficient documentation

## 2011-12-10 DIAGNOSIS — E119 Type 2 diabetes mellitus without complications: Secondary | ICD-10-CM | POA: Insufficient documentation

## 2011-12-10 MED ORDER — CYCLOBENZAPRINE HCL 10 MG PO TABS: 10.0000 mg | ORAL_TABLET | Freq: Two times a day (BID) | ORAL | Status: AC | PRN

## 2011-12-10 MED ORDER — TRAMADOL HCL 50 MG PO TABS: 50.0000 mg | ORAL_TABLET | Freq: Four times a day (QID) | ORAL | Status: AC | PRN

## 2011-12-10 NOTE — ED Notes (Signed)
MD at bedside. 

## 2011-12-10 NOTE — ED Provider Notes (Signed)
History     CSN: [id number]  Arrival date & time 12/10/11  1538   First MD Initiated Contact with Patient 12/10/11 1554      Chief Complaint  Patient presents with  . Back Pain     HPI Patient presents with low back and sacrum pain are about a week.  Was seen in this emergency room and evaluated with x-rays done of the lumbar spine with her negative.  States the pain medication muscle relaxer to help his pain but he is out of now.  Denies any urinary or fecal incontinence.  His pain is worsened with bending stooping at the waist. Past Medical History  Diagnosis Date  . HTN (hypertension), malignant     previously on BC and goody powders for HA  . Diabetes mellitus 2012    dx hospitalization with A1c 6.6%  . CKD (chronic kidney disease) stage 3, GFR 30-59 ml/min     baseline Cr 1.5-1.7  . Decreased visual acuity     Left eye, resolved - hypertensive retinopathy  . History of Bell's palsy 9/09    history, Left  . Morbid obesity   . Internal derangement of knee 9/09    Left  . History of headache   . Systolic murmur   . Microalbuminuria   . Vitamin d deficiency   . Systolic CHF     echo 5102 with nonischemic hypertensive cardiomyopathy  . Hilar adenopathy     on CT scan 02/2010, on rpt scan stable/improved.    Past Surgical History  Procedure Date  . Hospitalization 11/2008    malignant HTN, nl SPEP/UPEP, neg ANCA panel, nl C3/4, neg anti GBM Ab, neg Hep A/B/C, nl renal US, nl PTH, neg HIV  . US echocardiography 11/2008    LVsys fxn EF 50%, mild MR, normal LV size, neg ANA, neg cryoglobulins  . US echocardiography 2011    severe LVH, EF 40%, LA mildly dilated, PA pressure moderately increased    Family History  Problem Relation Age of Onset  . Hypertension Mother   . Hypertension Father   . Diabetes Father   . Hypertension Brother   . Diabetes Sister   . Coronary artery disease Neg Hx   . Stroke Neg Hx   . Cancer Neg Hx   . Kidney disease Paternal Grandmother       ESRD    History  Substance Use Topics  . Smoking status: Never Smoker   . Smokeless tobacco: Never Used   Comment: monthly, when going out  . Alcohol Use: Yes     Occasional      Review of Systems  All other systems reviewed and are negative.    Allergies  Fish-derived products and Iodine  Home Medications   Current Outpatient Rx  Name Route Sig Dispense Refill  . FUROSEMIDE 20 MG PO TABS Oral Take 1 tablet (20 mg total) by mouth 2 (two) times daily. 180 tablet 3  . HYDRALAZINE HCL 50 MG PO TABS  TAKE 1 TABLET BY MOUTH EVERY 8 HOURS 90 tablet 0  . LABETALOL HCL 300 MG PO TABS  TAKE 1 TABLET BY MOUTH 2 TIMES A DAY 60 tablet 6  . LOSARTAN POTASSIUM 100 MG PO TABS Oral Take 1 tablet (100 mg total) by mouth daily. 30 tablet 6  . SPIRONOLACTONE 50 MG PO TABS Oral Take 1 tablet (50 mg total) by mouth 2 (two) times daily. 60 tablet 11  . CYCLOBENZAPRINE HCL 10 MG PO TABS  Oral Take 1 tablet (10 mg total) by mouth 2 (two) times daily as needed for muscle spasms. 30 tablet 0  . TRAMADOL HCL 50 MG PO TABS Oral Take 1 tablet (50 mg total) by mouth every 6 (six) hours as needed for pain. 30 tablet 0    BP 215/117  Pulse 90  Temp 98.2 F (36.8 C) (Oral)  Resp 20  SpO2 99%  Physical Exam  Constitutional: He is oriented to person, place, and time. He appears well-developed and well-nourished.  HENT:  Head: Normocephalic and atraumatic.  Eyes: Pupils are equal, round, and reactive to light.  Neck: Normal range of motion. Neck supple.  Cardiovascular: Normal rate, regular rhythm and normal heart sounds.   Pulmonary/Chest: Effort normal and breath sounds normal. No respiratory distress. He has no wheezes. He has no rales. He exhibits no tenderness.  Abdominal: Soft. Bowel sounds are normal. There is no tenderness. There is no rebound and no guarding.  Musculoskeletal: Normal range of motion. He exhibits no edema.       Back:       Moderate tenderness to the mid and lower lumbar  spine and across the musculature in the lower back area. No step-offs or deformities are noted. No pain to the thoracic or cervical spine. Negative straight leg raise bilaterally. He has normal sensation and motor function in the legs. Normal pulses in the feet.  Lymphadenopathy:    He has no cervical adenopathy.  Neurological: He is alert and oriented to person, place, and time.  Skin: Skin is warm and dry. No rash noted.  Psychiatric: He has a normal mood and affect.    ED Course  Procedures (including critical care time)  Labs Reviewed - No data to display Dg Sacrum/coccyx  12/10/2011  *RADIOLOGY REPORT*  Clinical Data: Previous injury with back pain  SACRUM AND COCCYX - 2+ VIEW  Comparison: None.  Findings: The sacral ala are within normal limits.  No acute sacral or coccygeal fracture is noted.  The pelvic ring is intact.  No soft tissue abnormality is noted.  IMPRESSION: No acute abnormality is noted.  Original Report Authenticated By: Everlene Farrier, M.D.     1. Back pain       MDM         Dot Lanes, MD 12/10/11 [phone]

## 2011-12-10 NOTE — ED Notes (Signed)
Lower back pain. Fell down steps a week ago.

## 2011-12-10 NOTE — ED Notes (Signed)
Patient transported to X-ray 

## 2011-12-11 ENCOUNTER — Telehealth (HOSPITAL_COMMUNITY): Payer: Self-pay | Admitting: *Deleted

## 2011-12-11 NOTE — ED Notes (Signed)
8/14 Lynnea Maizes form [employer] called about pt., saying he is WC and was told to f/u with PCP.  I accessed chart and saw that pt. was seen by [facility].  8/15  1340  I called back and left a message saying he was not our pt.  Someone else called back and I told her he was not our pt.  He was seen at [facility]. She said she would send him there.  She hung up before I could tell her he should f/u with whoever is his Production assistant, radio. Vassie Moselle 12/11/2011

## 2011-12-11 NOTE — ED Notes (Signed)
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 needs to f/u with his Production assistant, radio.  She said the confusion came after his first visit, he was told to f/u with Urgent Care. I told her we only see the initial visit for [facility] that are after hours for [facility].  They should be the ones to determine when pt. should go back to work.  I gave her their phone number and address for [facility]. Vassie Moselle 12/11/2011

## 2011-12-29 ENCOUNTER — Other Ambulatory Visit: Payer: Self-pay | Admitting: Occupational Medicine

## 2011-12-29 DIAGNOSIS — M533 Sacrococcygeal disorders, not elsewhere classified: Secondary | ICD-10-CM

## 2011-12-29 DIAGNOSIS — M545 Low back pain: Secondary | ICD-10-CM

## 2011-12-30 ENCOUNTER — Other Ambulatory Visit: Payer: Self-pay | Admitting: Occupational Medicine

## 2011-12-30 DIAGNOSIS — M545 Low back pain: Secondary | ICD-10-CM

## 2011-12-30 DIAGNOSIS — M533 Sacrococcygeal disorders, not elsewhere classified: Secondary | ICD-10-CM

## 2012-01-02 ENCOUNTER — Ambulatory Visit
Admission: RE | Admit: 2012-01-02 | Discharge: 2012-01-02 | Disposition: A | Discharge status: Home / Self Care | Payer: Worker's Compensation | Source: Ambulatory Visit | Attending: Occupational Medicine | Admitting: Occupational Medicine

## 2012-01-02 DIAGNOSIS — M533 Sacrococcygeal disorders, not elsewhere classified: Secondary | ICD-10-CM

## 2012-01-02 DIAGNOSIS — M545 Low back pain: Secondary | ICD-10-CM

## 2012-01-10 ENCOUNTER — Other Ambulatory Visit: Payer: Self-pay | Admitting: Family Medicine

## 2012-02-24 ENCOUNTER — Other Ambulatory Visit: Payer: Self-pay | Admitting: Family Medicine

## 2012-04-07 ENCOUNTER — Other Ambulatory Visit: Payer: Self-pay | Admitting: Family Medicine

## 2012-04-22 ENCOUNTER — Other Ambulatory Visit: Payer: Self-pay | Admitting: Family Medicine

## 2012-06-28 ENCOUNTER — Telehealth: Payer: Self-pay | Admitting: Family Medicine

## 2012-06-30 NOTE — Telephone Encounter (Signed)
Opened in error

## 2012-07-13 ENCOUNTER — Telehealth: Payer: Self-pay | Admitting: Family Medicine

## 2012-07-13 NOTE — Telephone Encounter (Signed)
Patient states he needs a copy of the note that Dr. Sharen Hones wrote for him stating that he can take a late lunch at work.  Please give him a call at [phone]

## 2012-07-14 NOTE — Telephone Encounter (Signed)
Letter printed and placed up front for pick up. Message left notifying patient.  

## 2012-07-22 ENCOUNTER — Other Ambulatory Visit: Payer: Self-pay | Admitting: Family Medicine

## 2012-10-05 ENCOUNTER — Other Ambulatory Visit: Payer: Self-pay | Admitting: Family Medicine

## 2012-10-05 NOTE — Telephone Encounter (Signed)
Sent in, pt will need f/u appt.

## 2012-12-11 IMAGING — CR DG SACRUM/COCCYX 2+V
3 series · 3 of 3 positions shown · non-contrast
Comparison: None.

CLINICAL DATA: Previous injury with back pain

SACRUM AND COCCYX - 2+ VIEW

[t sacrum a.p.]
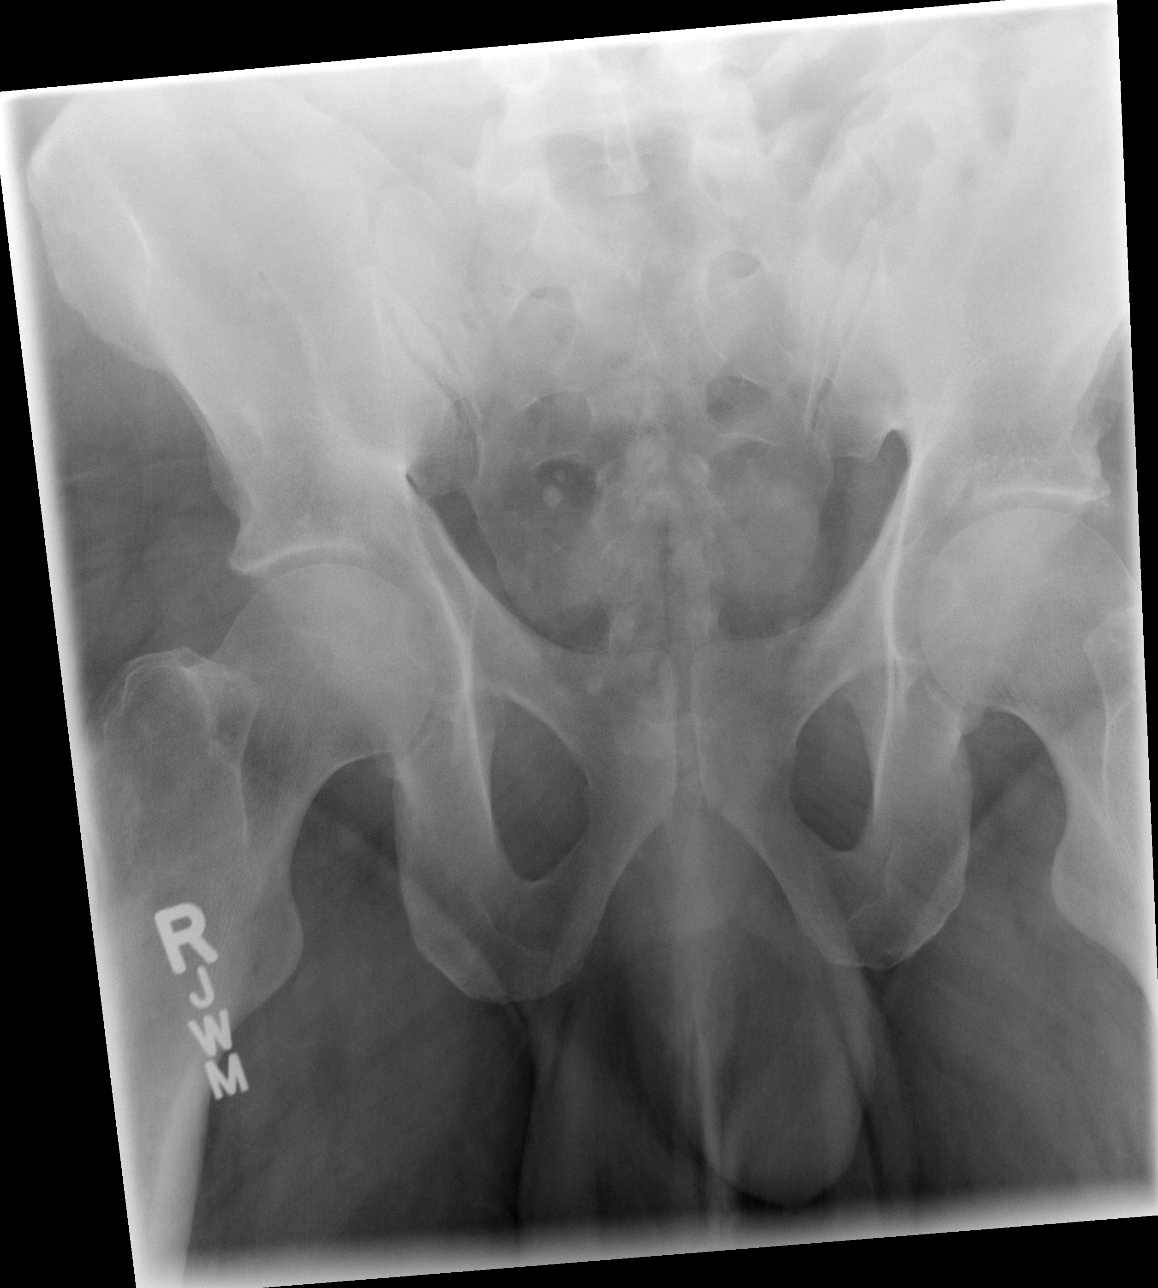

[t coccyx a.p.]
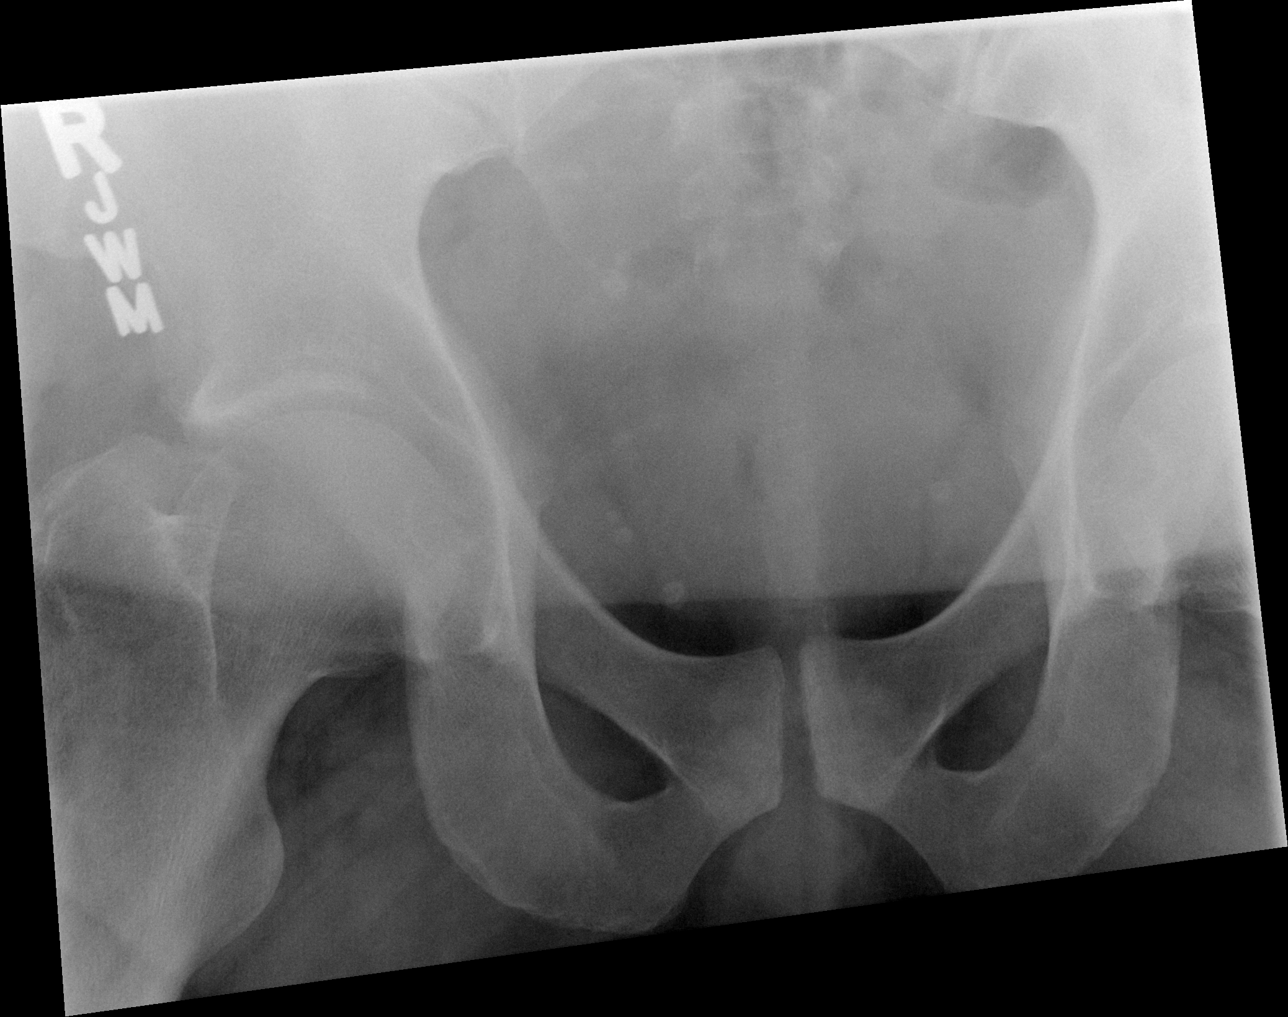

[t sacrum lat]
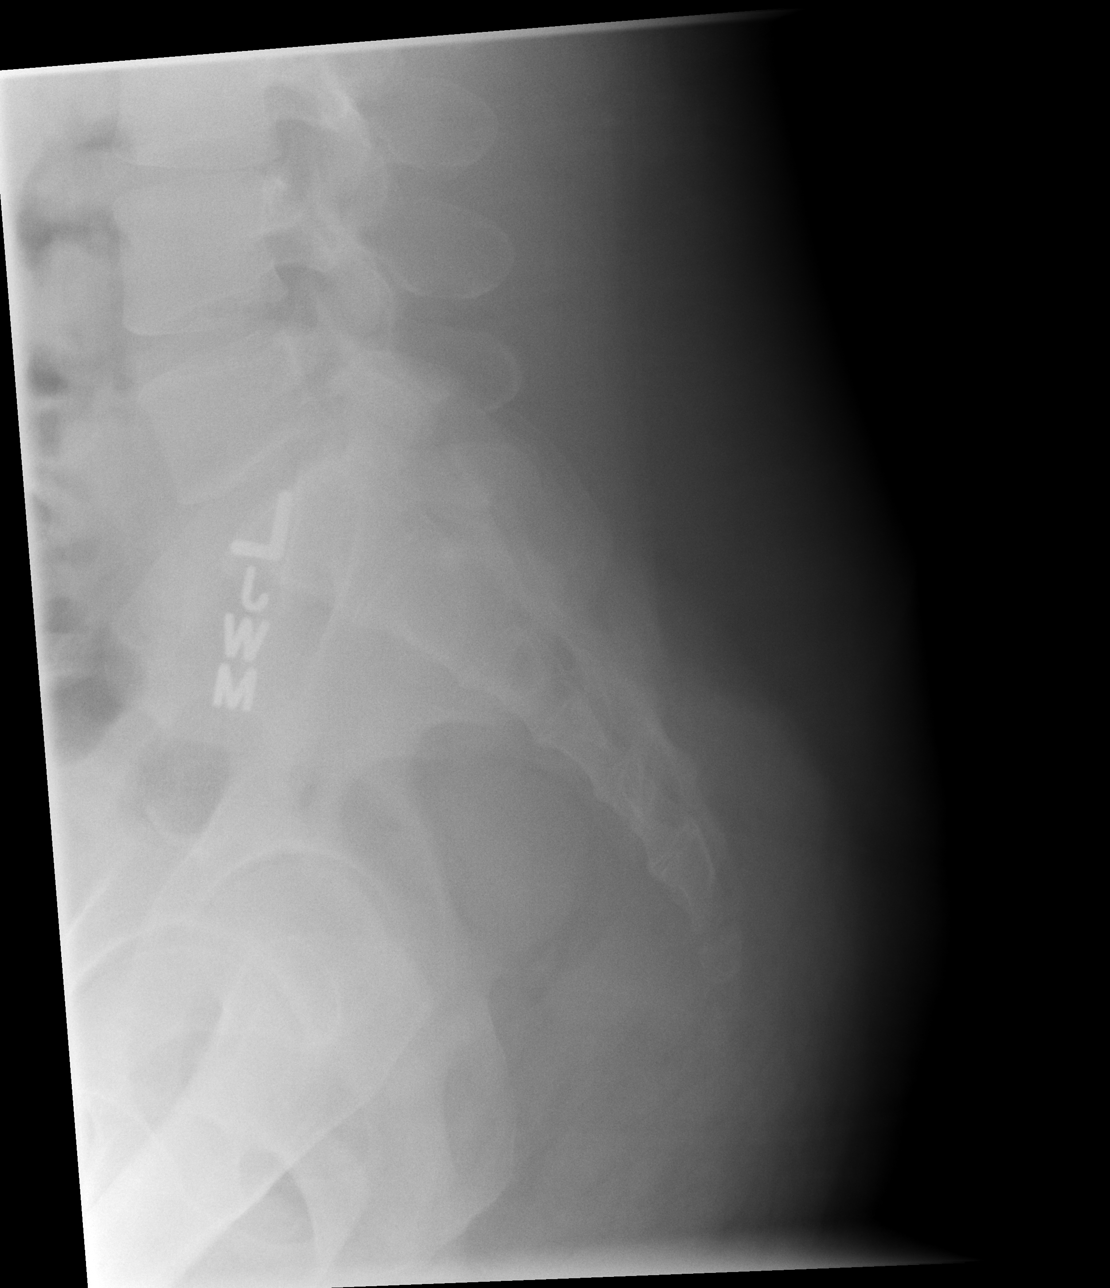

[3 of 3 positions shown; findings below may reference images not displayed]

FINDINGS: The sacral ala are within normal limits.  No acute sacral
or coccygeal fracture is noted.  The pelvic ring is intact.  No
soft tissue abnormality is noted.
IMPRESSION: No acute abnormality is noted.

## 2013-04-11 ENCOUNTER — Other Ambulatory Visit: Payer: Self-pay | Admitting: Family Medicine

## 2013-10-24 ENCOUNTER — Other Ambulatory Visit: Payer: Self-pay | Admitting: Family Medicine

## 2013-11-01 ENCOUNTER — Telehealth: Payer: Self-pay | Admitting: *Deleted

## 2013-11-01 MED ORDER — FUROSEMIDE 20 MG PO TABS: ORAL_TABLET | ORAL | Status: DC

## 2013-11-01 MED ORDER — LABETALOL HCL 300 MG PO TABS: ORAL_TABLET | ORAL | Status: DC

## 2013-11-01 MED ORDER — SPIRONOLACTONE 50 MG PO TABS: ORAL_TABLET | ORAL | Status: DC

## 2013-11-01 MED ORDER — HYDRALAZINE HCL 50 MG PO TABS: ORAL_TABLET | ORAL | Status: DC

## 2013-11-01 MED ORDER — LOSARTAN POTASSIUM 100 MG PO TABS: ORAL_TABLET | ORAL | Status: DC

## 2013-11-01 NOTE — Telephone Encounter (Signed)
Patient called and said his face was red and he thought it was from not having his meds x 5 days. He has a follow up with you in 2 days but was begging for his meds to be refilled today. He denied HA, dizziness, blurry vision or any other problems-only the red face. You have not seen him in over 2 years and his meds were last filled 12/14 with no refills. I asked who had been filling them for him and he said the pharmacy just kept giving them to him (?). I gave him 5 pills to take until his appt (which is all he wanted just to get him through). I offered him an appt tomorrow and he declined saying he could only make it Friday.

## 2013-11-01 NOTE — Telephone Encounter (Signed)
Noted  

## 2013-11-03 ENCOUNTER — Encounter: Payer: Self-pay | Admitting: Family Medicine

## 2013-11-03 ENCOUNTER — Ambulatory Visit (INDEPENDENT_AMBULATORY_CARE_PROVIDER_SITE_OTHER): Payer: Self-pay | Admitting: Family Medicine

## 2013-11-03 ENCOUNTER — Encounter (INDEPENDENT_AMBULATORY_CARE_PROVIDER_SITE_OTHER): Payer: Self-pay

## 2013-11-03 VITALS — BP 178/120 | HR 80 | Temp 98.1°F | Wt 394.8 lb

## 2013-11-03 DIAGNOSIS — E119 Type 2 diabetes mellitus without complications: Secondary | ICD-10-CM

## 2013-11-03 DIAGNOSIS — I129 Hypertensive chronic kidney disease with stage 1 through stage 4 chronic kidney disease, or unspecified chronic kidney disease: Secondary | ICD-10-CM

## 2013-11-03 DIAGNOSIS — I509 Heart failure, unspecified: Secondary | ICD-10-CM

## 2013-11-03 DIAGNOSIS — R809 Proteinuria, unspecified: Secondary | ICD-10-CM

## 2013-11-03 DIAGNOSIS — N183 Chronic kidney disease, stage 3 unspecified: Secondary | ICD-10-CM

## 2013-11-03 DIAGNOSIS — E1129 Type 2 diabetes mellitus with other diabetic kidney complication: Secondary | ICD-10-CM

## 2013-11-03 DIAGNOSIS — I5022 Chronic systolic (congestive) heart failure: Secondary | ICD-10-CM

## 2013-11-03 DIAGNOSIS — I130 Hypertensive heart and chronic kidney disease with heart failure and stage 1 through stage 4 chronic kidney disease, or unspecified chronic kidney disease: Secondary | ICD-10-CM

## 2013-11-03 MED ORDER — FUROSEMIDE 20 MG PO TABS: ORAL_TABLET | ORAL | Status: DC

## 2013-11-03 MED ORDER — LABETALOL HCL 300 MG PO TABS: ORAL_TABLET | ORAL | Status: DC

## 2013-11-03 MED ORDER — HYDRALAZINE HCL 50 MG PO TABS: ORAL_TABLET | ORAL | Status: DC

## 2013-11-03 MED ORDER — SPIRONOLACTONE 50 MG PO TABS: ORAL_TABLET | ORAL | Status: DC

## 2013-11-03 MED ORDER — LOSARTAN POTASSIUM 100 MG PO TABS: ORAL_TABLET | ORAL | Status: DC

## 2013-11-03 NOTE — Assessment & Plan Note (Signed)
bp deteriorated again. reviewed 5 med regimen - and refilled for 1 year.  Provided with pt educational handout on DASH diet and AHA website for further resources Goal BP <135/85 given CKD. RTC 1-2 mo for f/u, consider EKG at that time. RTC 1 wk for labwork after restarting 5 med regimen. Pt agrees with plan.

## 2013-11-03 NOTE — Assessment & Plan Note (Signed)
Nonischemic HTNive CM. Check EKG next visit.

## 2013-11-03 NOTE — Progress Notes (Signed)
BP 178/120  Pulse 80  Temp(Src) 98.1 F (36.7 C) (Oral)  Wt 394 lb 12 oz (179.057 kg)   CC: med refill  Subjective:    Patient ID: Randy [name], male    DOB: 06/04/1970, 43 y.o.   MRN: [id number]  HPI: Randy [name] is a 43 y.o. male presenting on 11/03/2013 for Medication Refill and Follow-up   Not seen here since 2013. Ran out of meds recently. Had only been taking 3 medications - had not been taking losartan or labetalol. Work going well, states has insurance. Checks blood pressure once every few weeks. No HA, vision changes, CP/tightness, leg swelling. Flushing noted with increased bp recently. Mild dyspnea.  Hydralazine causing headache. H/o nonischemic CM.  Lab Results  Component Value Date   CREATININE 1.6* 08/12/2011   Wt Readings from Last 3 Encounters:  11/03/13 394 lb 12 oz (179.057 kg)  12/01/11 370 lb (167.831 kg)  09/28/11 412 lb 12 oz (187.222 kg)   Body mass index is 45.63 kg/(m^2).  BP Readings from Last 3 Encounters:  11/03/13 178/120  12/10/11 215/117  12/01/11 161/105   Lab Results  Component Value Date   HGBA1C 6.6* 04/03/2011   Relevant past medical, surgical, family and social history reviewed and updated as indicated.  Allergies and medications reviewed and updated. No current outpatient prescriptions on file prior to visit.   No current facility-administered medications on file prior to visit.    Review of Systems Per HPI unless specifically indicated above    Objective:    BP 178/120  Pulse 80  Temp(Src) 98.1 F (36.7 C) (Oral)  Wt 394 lb 12 oz (179.057 kg)  Physical Exam  Nursing note and vitals reviewed. Constitutional: He appears well-developed and well-nourished. No distress.  HENT:  Mouth/Throat: Oropharynx is clear and moist. No oropharyngeal exudate.  Cardiovascular: Normal rate, regular rhythm, normal heart sounds and intact distal pulses.   No murmur heard. Pulmonary/Chest: Effort normal and breath sounds normal.  No respiratory distress. He has no wheezes. He has no rales.  Musculoskeletal: He exhibits no edema.   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    Relevant Medications      losartan (COZAAR) tablet   Systolic CHF     Nonischemic HTNive CM. Check EKG next visit.    Relevant Medications      furosemide (LASIX) tablet      hydrALAZINE (APRESOLINE) tablet      labetalol (NORMODYNE) tablet      losartan (COZAAR) tablet      spironolactone (ALDACTONE) tablet   Morbid obesity      Body mass index is 45.63 kg/(m^2).  Wt Readings from Last 3 Encounters:  11/03/13 394 lb  12 oz (179.057 kg)  12/01/11 370 lb (167.831 kg)  09/28/11 412 lb 12 oz (187.222 kg)  reviewed weight trends with patient.    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 reviewed 5 med regimen - and refilled for 1 year.  Provided with pt educational handout on DASH diet and AHA website for further resources Goal BP <135/85 given CKD. RTC 1-2 mo for f/u, consider EKG at that time. RTC 1 wk for labwork after restarting 5 med regimen. Pt agrees with plan.    Relevant Medications      furosemide (LASIX) tablet      hydrALAZINE (APRESOLINE) tablet      labetalol (NORMODYNE) tablet      losartan (COZAAR) tablet      spironolactone (ALDACTONE) tablet       Follow up plan: Return in about 1 month (around  12/04/2013), or as needed, for follow up visit.

## 2013-11-03 NOTE — Patient Instructions (Addendum)
Return in 1 week to check blood work (fasting if you can). Return in 1-2 months for bp follow up after starting all 5 meds. Good to see you today, call us with questions.  Your goal blood pressure is >135/85 given your kidney disease. Work on low salt/sodium diet - goal <1.5gm (1,500mg ) per day. Eat a diet high in fruits/vegetables and whole grains.  Look into mediterranean and DASH diet. Goal activity is 121min/wk of moderate intensity exercise.  This can be split into 30 minute chunks.  If you are not at this level, you can start with smaller 10-15 min increments and slowly build up activity. Look at www.heart.org for more resources  DASH Eating Plan DASH stands for "Dietary Approaches to Stop Hypertension." The DASH eating plan is a healthy eating plan that has been shown to reduce high blood pressure (hypertension). Additional health benefits may include reducing the risk of type 2 diabetes mellitus, heart disease, and stroke. The DASH eating plan may also help with weight loss. WHAT DO I NEED TO KNOW ABOUT THE DASH EATING PLAN? For the DASH eating plan, you will follow these general guidelines:  Choose foods with a percent daily value for sodium of less than 5% (as listed on the food label).  Use salt-free seasonings or herbs instead of table salt or sea salt.  Check with your health care provider or pharmacist before using salt substitutes.  Eat lower-sodium products, often labeled as "lower sodium" or "no salt added."  Eat fresh foods.  Eat more vegetables, fruits, and low-fat dairy products.  Choose whole grains. Look for the word "whole" as the first word in the ingredient list.  Choose fish and skinless chicken or [place] more often than red meat. Limit fish, poultry, and meat to 6 oz (170 g) each day.  Limit sweets, desserts, sugars, and sugary drinks.  Choose heart-healthy fats.  Limit cheese to 1 oz (28 g) per day.  Eat more home-cooked food and less restaurant,  buffet, and fast food.  Limit fried foods.  Cook foods using methods other than frying.  Limit canned vegetables. If you do use them, rinse them well to decrease the sodium.  When eating at a restaurant, ask that your food be prepared with less salt, or no salt if possible. WHAT FOODS CAN I EAT? Seek help from a dietitian for individual calorie needs. Grains Whole grain or whole wheat bread. Brown rice. Whole grain or whole wheat pasta. Quinoa, bulgur, and whole grain cereals. Low-sodium cereals. Corn or whole wheat flour tortillas. Whole grain cornbread. Whole grain crackers. Low-sodium crackers. Vegetables Fresh or frozen vegetables (raw, steamed, roasted, or grilled). Low-sodium or reduced-sodium tomato and vegetable juices. Low-sodium or reduced-sodium tomato sauce and paste. Low-sodium or reduced-sodium canned vegetables.  Fruits All fresh, canned (in natural juice), or frozen fruits. Meat and Other Protein Products Ground beef (85% or leaner), grass-fed beef, or beef trimmed of fat. Skinless chicken or [place]. Ground chicken or [place]. Pork trimmed of fat. All fish and seafood. Eggs. Dried beans, peas, or lentils. Unsalted nuts and seeds. Unsalted canned beans. Dairy Low-fat dairy products, such as skim or 1% milk, 2% or reduced-fat cheeses, low-fat ricotta or cottage cheese, or plain low-fat yogurt. Low-sodium or reduced-sodium cheeses. Fats and Oils Tub margarines without trans fats. Light or reduced-fat mayonnaise and salad dressings (reduced sodium). Avocado. Safflower, olive, or canola oils. Natural peanut or almond butter. Other Unsalted popcorn and pretzels. The items listed above may not be a complete list  of recommended foods or beverages. Contact your dietitian for more options. WHAT FOODS ARE NOT RECOMMENDED? Grains White bread. White pasta. White rice. Refined cornbread. Bagels and croissants. Crackers that contain trans fat. Vegetables Creamed or fried vegetables.  Vegetables in a cheese sauce. Regular canned vegetables. Regular canned tomato sauce and paste. Regular tomato and vegetable juices. Fruits Dried fruits. Canned fruit in light or heavy syrup. Fruit juice. Meat and Other Protein Products Fatty cuts of meat. Ribs, chicken wings, bacon, sausage, bologna, salami, chitterlings, fatback, hot dogs, bratwurst, and packaged luncheon meats. Salted nuts and seeds. Canned beans with salt. Dairy Whole or 2% milk, cream, half-and-half, and cream cheese. Whole-fat or sweetened yogurt. Full-fat cheeses or blue cheese. Nondairy creamers and whipped toppings. Processed cheese, cheese spreads, or cheese curds. Condiments Onion and garlic salt, seasoned salt, table salt, and sea salt. Canned and packaged gravies. Worcestershire sauce. Tartar sauce. Barbecue sauce. Teriyaki sauce. Soy sauce, including reduced sodium. Steak sauce. Fish sauce. Oyster sauce. Cocktail sauce. Horseradish. Ketchup and mustard. Meat flavorings and tenderizers. Bouillon cubes. Hot sauce. Tabasco sauce. Marinades. Taco seasonings. Relishes. Fats and Oils Butter, stick margarine, lard, shortening, ghee, and bacon fat. Coconut, palm kernel, or palm oils. Regular salad dressings. Other Pickles and olives. Salted popcorn and pretzels. The items listed above may not be a complete list of foods and beverages to avoid. Contact your dietitian for more information. WHERE CAN I FIND MORE INFORMATION? National Heart, Lung, and Blood Institute: CablePromo.itwww.nhlbi.nih.gov/health/health-topics/topics/dash/ Document Released: 04/02/2011 Document Revised: 04/18/2013 Document Reviewed: 02/15/2013 [employer] Patient Information 2015 [place], [place]. This information is not intended to replace advice given to you by your health care provider. Make sure you discuss any questions you have with your health care provider.

## 2013-11-03 NOTE — Assessment & Plan Note (Addendum)
Check A1c next labwork Check fasting lipid panel next week.

## 2013-11-03 NOTE — Assessment & Plan Note (Addendum)
Body mass index is 45.63 kg/(m^2).  Wt Readings from Last 3 Encounters:  11/03/13 394 lb 12 oz (179.057 kg)  12/01/11 370 lb (167.831 kg)  09/28/11 412 lb 12 oz (187.222 kg)  reviewed weight trends with patient.

## 2013-11-03 NOTE — Progress Notes (Signed)
Pre visit review using our clinic review tool, if applicable. No additional management support is needed unless otherwise documented below in the visit note. 

## 2013-11-09 ENCOUNTER — Other Ambulatory Visit: Payer: Self-pay | Admitting: Family Medicine

## 2013-11-09 DIAGNOSIS — E1129 Type 2 diabetes mellitus with other diabetic kidney complication: Secondary | ICD-10-CM

## 2013-11-09 DIAGNOSIS — N259 Disorder resulting from impaired renal tubular function, unspecified: Secondary | ICD-10-CM

## 2013-11-09 DIAGNOSIS — N183 Chronic kidney disease, stage 3 (moderate): Secondary | ICD-10-CM

## 2013-11-09 DIAGNOSIS — I130 Hypertensive heart and chronic kidney disease with heart failure and stage 1 through stage 4 chronic kidney disease, or unspecified chronic kidney disease: Secondary | ICD-10-CM

## 2013-11-09 DIAGNOSIS — R809 Proteinuria, unspecified: Secondary | ICD-10-CM

## 2013-11-10 ENCOUNTER — Other Ambulatory Visit (INDEPENDENT_AMBULATORY_CARE_PROVIDER_SITE_OTHER): Payer: Self-pay

## 2013-11-10 DIAGNOSIS — N183 Chronic kidney disease, stage 3 unspecified: Secondary | ICD-10-CM

## 2013-11-10 DIAGNOSIS — R809 Proteinuria, unspecified: Secondary | ICD-10-CM

## 2013-11-10 DIAGNOSIS — I129 Hypertensive chronic kidney disease with stage 1 through stage 4 chronic kidney disease, or unspecified chronic kidney disease: Secondary | ICD-10-CM

## 2013-11-10 DIAGNOSIS — E1129 Type 2 diabetes mellitus with other diabetic kidney complication: Secondary | ICD-10-CM

## 2013-11-10 DIAGNOSIS — I130 Hypertensive heart and chronic kidney disease with heart failure and stage 1 through stage 4 chronic kidney disease, or unspecified chronic kidney disease: Secondary | ICD-10-CM

## 2013-11-10 DIAGNOSIS — E119 Type 2 diabetes mellitus without complications: Secondary | ICD-10-CM

## 2013-11-10 DIAGNOSIS — N259 Disorder resulting from impaired renal tubular function, unspecified: Secondary | ICD-10-CM

## 2013-11-10 DIAGNOSIS — I509 Heart failure, unspecified: Secondary | ICD-10-CM

## 2013-11-10 LAB — COMPREHENSIVE METABOLIC PANEL
ALBUMIN: 3.8 g/dL (ref 3.5–5.2)
ALT: 23 U/L (ref 0–53)
AST: 20 U/L (ref 0–37)
Alkaline Phosphatase: 64 U/L (ref 39–117)
BUN: 14 mg/dL (ref 6–23)
CALCIUM: 9.4 mg/dL (ref 8.4–10.5)
CHLORIDE: 103 meq/L (ref 96–112)
CO2: 23 mEq/L (ref 19–32)
CREATININE: 1.6 mg/dL — AB (ref 0.4–1.5)
GFR: 63.45 mL/min (ref >60.00)
GLUCOSE: 254 mg/dL — AB (ref 70–99)
POTASSIUM: 4 meq/L (ref 3.5–5.1)
Sodium: 135 mEq/L (ref 135–145)
Total Bilirubin: 0.4 mg/dL (ref 0.2–1.2)
Total Protein: 7.4 g/dL (ref 6.0–8.3)

## 2013-11-10 LAB — LIPID PANEL
CHOLESTEROL: 130 mg/dL (ref 0–200)
HDL: 34.5 mg/dL — AB (ref >39.00)
LDL CALC: 69 mg/dL (ref 0–99)
NonHDL: 95.5
TRIGLYCERIDES: 131 mg/dL (ref 0.0–149.0)
Total CHOL/HDL Ratio: 4
VLDL: 26.2 mg/dL (ref 0.0–40.0)

## 2013-11-10 LAB — HEMOGLOBIN A1C: HEMOGLOBIN A1C: 12.5 % — AB (ref 4.6–6.5)

## 2013-11-10 LAB — MICROALBUMIN / CREATININE URINE RATIO
Creatinine,U: 235.5 mg/dL
MICROALB/CREAT RATIO: 33.5 mg/g — AB (ref 0.0–30.0)
Microalb, Ur: 79 mg/dL — ABNORMAL HIGH (ref 0.0–1.9)

## 2013-11-12 ENCOUNTER — Other Ambulatory Visit: Payer: Self-pay | Admitting: Family Medicine

## 2013-11-12 MED ORDER — GLIMEPIRIDE 2 MG PO TABS
2.0000 mg | ORAL_TABLET | Freq: Every day | ORAL | Status: DC
Start: 2013-11-12 — End: 2014-07-10

## 2013-11-28 ENCOUNTER — Ambulatory Visit (INDEPENDENT_AMBULATORY_CARE_PROVIDER_SITE_OTHER): Payer: Self-pay | Admitting: Internal Medicine

## 2013-11-28 ENCOUNTER — Encounter: Payer: Self-pay | Admitting: Internal Medicine

## 2013-11-28 VITALS — BP 140/80 | HR 80 | Temp 97.5°F | Wt >= 6400 oz

## 2013-11-28 DIAGNOSIS — M25569 Pain in unspecified knee: Secondary | ICD-10-CM

## 2013-11-28 DIAGNOSIS — M25562 Pain in left knee: Secondary | ICD-10-CM

## 2013-11-28 NOTE — Progress Notes (Signed)
Subjective:    Patient ID: Randy [name], male    DOB: 05-07-1970, 43 y.o.   MRN: [id number]  HPI  Pt presents to the clinic today with c/o left knee pain. He reports this started 2 weeks ago. He reports he did slip but he did not fall. This did occur at work but he did not report it.  He reports sharps, stabbing pain in the sides of the knee. He denies swelling or bruising. It is worse when he walks. He denies numbness or tingling in the leg.   Review of Systems      Past Medical History  Diagnosis Date  . HTN (hypertension), malignant     previously on BC and goody powders for HA  . Diabetes mellitus 2012    dx hospitalization with A1c 6.6%  . CKD (chronic kidney disease) stage 3, GFR 30-59 ml/min     baseline Cr 1.5-1.7  . Decreased visual acuity     Left eye, resolved - hypertensive retinopathy  . History of Bell's palsy 9/09    history, Left  . Morbid obesity   . Internal derangement of knee 9/09    Left  . History of headache   . Systolic murmur   . Microalbuminuria   . Vitamin D deficiency   . Systolic CHF     echo [id number] with nonischemic hypertensive cardiomyopathy  . Hilar adenopathy     on CT scan 02/2010, on rpt scan stable/improved.    Current Outpatient Prescriptions  Medication Sig Dispense Refill  . furosemide (LASIX) 20 MG tablet TAKE 1 TABLET BY MOUTH ONCE A DAY  30 tablet  11  . glimepiride (AMARYL) 2 MG tablet Take 1 tablet (2 mg total) by mouth daily before breakfast.  30 tablet  3  . hydrALAZINE (APRESOLINE) 50 MG tablet TAKE 1 TABLET BY MOUTH EVERY 8 HOURS  90 tablet  11  . labetalol (NORMODYNE) 300 MG tablet TAKE 1 TABLET BY MOUTH 2 TIMES A DAY  60 tablet  11  . losartan (COZAAR) 100 MG tablet TAKE 1 TABLET (100 MG TOTAL) BY MOUTH DAILY.  30 tablet  11  . spironolactone (ALDACTONE) 50 MG tablet TAKE 1 TABLET (50 MG TOTAL) BY MOUTH 2 (TWO) TIMES DAILY.  60 tablet  11   No current facility-administered medications for this visit.    Allergies    Allergen Reactions  . Fish-Derived Products Anaphylaxis  . Iodine Anaphylaxis    Family History  Problem Relation Age of Onset  . Hypertension Mother   . Hypertension Father   . Diabetes Father   . Hypertension Brother   . Diabetes Sister   . Coronary artery disease Neg Hx   . Stroke Neg Hx   . Cancer Neg Hx   . Kidney disease Paternal Grandmother     ESRD    History   Social History  . Marital Status: Married    Spouse Name: N/A    Number of Children: N/A  . Years of Education: N/A   Occupational History  . Not on file.   Social History Main Topics  . Smoking status: Never Smoker   . Smokeless tobacco: Never Used     Comment: monthly, when going out  . Alcohol Use: Yes     Comment: Occasional  . Drug Use: No  . Sexual Activity: Not on file   Other Topics Concern  . Not on file   Social History Narrative   Newly married, 2012, 1 daughter  No injectable steroid cycles   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, 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)   Regular exercise-yes     Constitutional: Denies fever, malaise, fatigue, headache or abrupt weight changes.  Musculoskeletal: Pt reports left knee pain. Denies decrease in range of motion, difficulty with gait, muscle pain or joint swelling.    No other specific complaints in a complete review of systems (except as listed in HPI above).  Objective:   Physical Exam  BP 140/80  Pulse 80  Temp(Src) 97.5 F (36.4 C) (Tympanic)  Wt 404 lb (183.253 kg)  SpO2 98% Wt Readings from Last 3 Encounters:  11/28/13 404 lb (183.253 kg)  11/03/13 394 lb 12 oz (179.057 kg)  12/01/11 370 lb (167.831 kg)    General: Appears his stated age, obese but well developed, well nourished in NAD. Musculoskeletal: Normal flexion and extension of the left knee. No crepitus notes with ROM. No pain with palpation of the tendons or bursa. Pain with palpation of the medial and lateral side of the knee. No signs of joint swelling.  No difficulty with gait. Negative McMurry. ? Positive Lachmans. BMET    Component Value Date/Time   NA 135 11/10/2013 0950   K 4.0 11/10/2013 0950   CL 103 11/10/2013 0950   CO2 23 11/10/2013 0950   GLUCOSE 254* 11/10/2013 0950   BUN 14 11/10/2013 0950   CREATININE 1.6* 11/10/2013 0950   CREATININE 1.59* 05/29/2011 1552   CALCIUM 9.4 11/10/2013 0950   GFRNONAA 51* 03/17/2010 0458   GFRAA  Value: >60        The eGFR has been calculated using the MDRD equation. This calculation has not been validated in all clinical situations. eGFR's persistently <60 mL/min signify possible Chronic Kidney Disease. 03/17/2010 0458    Lipid Panel     Component Value Date/Time   CHOL 130 11/10/2013 0950   TRIG 131.0 11/10/2013 0950   HDL 34.50* 11/10/2013 0950   CHOLHDL 4 11/10/2013 0950   VLDL 26.2 11/10/2013 0950   LDLCALC 69 11/10/2013 0950    CBC    Component Value Date/Time   WBC 9.5 09/05/2010 1238   RBC 5.56 09/05/2010 1238   HGB 13.8 09/05/2010 1238   HCT 42.5 09/05/2010 1238   PLT 245.0 09/05/2010 1238   MCV 76.4* 09/05/2010 1238   MCH 25.7* 03/15/2010 0335   MCHC 32.4 09/05/2010 1238   RDW 18.4* 09/05/2010 1238   LYMPHSABS 3.1 09/05/2010 1238   MONOABS 1.1* 09/05/2010 1238   EOSABS 0.2 09/05/2010 1238   BASOSABS 0.1 09/05/2010 1238    Hgb A1C Lab Results  Component Value Date   HGBA1C 12.5* 11/10/2013         Assessment & Plan:   Left knee pain:  Advised him to try ibuprofen OTC He wants to report it at work before proceeding with xray/NRI Knee exercises given to help strengthen the knee  RTC as needed or if pain persist or worsens

## 2013-11-28 NOTE — Patient Instructions (Addendum)
Knee Exercises  EXERCISES  RANGE OF MOTION (ROM) AND STRETCHING EXERCISES  These exercises may help you when beginning to rehabilitate your injury. Your symptoms may resolve with or without further involvement from your physician, physical therapist, or athletic trainer. While completing these exercises, remember:   · Restoring tissue flexibility helps normal motion to return to the joints. This allows healthier, less painful movement and activity.  · An effective stretch should be held for at least 30 seconds.  · A stretch should never be painful. You should only feel a gentle lengthening or release in the stretched tissue.  STRETCH - Knee Extension, Prone  · Lie on your stomach on a firm surface, such as a bed or countertop. Place your right / left knee and leg just beyond the edge of the surface. You may wish to place a towel under the far end of your right / left thigh for comfort.  · Relax your leg muscles and allow gravity to straighten your knee. Your clinician may advise you to add an ankle weight if more resistance is helpful for you.  · You should feel a stretch in the back of your right / left knee. Hold this position for __________ seconds.  Repeat __________ times. Complete this stretch __________ times per day.  * Your physician, physical therapist, or athletic trainer may ask you to add ankle weight to enhance your stretch.   RANGE OF MOTION - Knee Flexion, Active  · Lie on your back with both knees straight. (If this causes back discomfort, bend your opposite knee, placing your foot flat on the floor.)  · Slowly slide your heel back toward your buttocks until you feel a gentle stretch in the front of your knee or thigh.  · Hold for __________ seconds. Slowly slide your heel back to the starting position.  Repeat __________ times. Complete this exercise __________ times per day.   STRETCH - Quadriceps, Prone   · Lie on your stomach on a firm surface, such as a bed or padded floor.  · Bend your right /  left knee and grasp your ankle. If you are unable to reach your ankle or pant leg, use a belt around your foot to lengthen your reach.  · Gently pull your heel toward your buttocks. Your knee should not slide out to the side. You should feel a stretch in the front of your thigh and/or knee.  · Hold this position for __________ seconds.  Repeat __________ times. Complete this stretch __________ times per day.   STRETCH - Hamstrings, Supine   · Lie on your back. Loop a belt or towel over the ball of your right / left foot.  · Straighten your right / left knee and slowly pull on the belt to raise your leg. Do not allow the right / left knee to bend. Keep your opposite leg flat on the floor.  · Raise the leg until you feel a gentle stretch behind your right / left knee or thigh. Hold this position for __________ seconds.  Repeat __________ times. Complete this stretch __________ times per day.   STRENGTHENING EXERCISES  These exercises may help you when beginning to rehabilitate your injury. They may resolve your symptoms with or without further involvement from your physician, physical therapist, or athletic trainer. While completing these exercises, remember:   · Muscles can gain both the endurance and the strength needed for everyday activities through controlled exercises.  · Complete these exercises as instructed by your   physician, physical therapist, or athletic trainer. Progress the resistance and repetitions only as guided.  · You may experience muscle soreness or fatigue, but the pain or discomfort you are trying to eliminate should never worsen during these exercises. If this pain does worsen, stop and make certain you are following the directions exactly. If the pain is still present after adjustments, discontinue the exercise until you can discuss the trouble with your clinician.  STRENGTH - Quadriceps, Isometrics  · Lie on your back with your right / left leg extended and your opposite knee  bent.  · Gradually tense the muscles in the front of your right / left thigh. You should see either your knee cap slide up toward your hip or increased dimpling just above the knee. This motion will push the back of the knee down toward the floor/mat/bed on which you are lying.  · Hold the muscle as tight as you can without increasing your pain for __________ seconds.  · Relax the muscles slowly and completely in between each repetition.  Repeat __________ times. Complete this exercise __________ times per day.   STRENGTH - Quadriceps, Short Arcs   · Lie on your back. Place a __________ inch towel roll under your knee so that the knee slightly bends.  · Raise only your lower leg by tightening the muscles in the front of your thigh. Do not allow your thigh to rise.  · Hold this position for __________ seconds.  Repeat __________ times. Complete this exercise __________ times per day.   OPTIONAL ANKLE WEIGHTS: Begin with ____________________, but DO NOT exceed ____________________. Increase in 1 pound/0.5 kilogram increments.   STRENGTH - Quadriceps, Straight Leg Raises   Quality counts! Watch for signs that the quadriceps muscle is working to insure you are strengthening the correct muscles and not "cheating" by substituting with healthier muscles.  · Lay on your back with your right / left leg extended and your opposite knee bent.  · Tense the muscles in the front of your right / left thigh. You should see either your knee cap slide up or increased dimpling just above the knee. Your thigh may even quiver.  · Tighten these muscles even more and raise your leg 4 to 6 inches off the floor. Hold for __________ seconds.  · Keeping these muscles tense, lower your leg.  · Relax the muscles slowly and completely in between each repetition.  Repeat __________ times. Complete this exercise __________ times per day.   STRENGTH - Hamstring, Curls  · Lay on your stomach with your legs extended. (If you lay on a bed, your feet  may hang over the edge.)  · Tighten the muscles in the back of your thigh to bend your right / left knee up to 90 degrees. Keep your hips flat on the bed/floor.  · Hold this position for __________ seconds.  · Slowly lower your leg back to the starting position.  Repeat __________ times. Complete this exercise __________ times per day.   OPTIONAL ANKLE WEIGHTS: Begin with ____________________, but DO NOT exceed ____________________. Increase in 1 pound/0.5 kilogram increments.   STRENGTH - Quadriceps, Squats  · Stand in a door frame so that your feet and knees are in line with the frame.  · Use your hands for balance, not support, on the frame.  · Slowly lower your weight, bending at the hips and knees. Keep your lower legs upright so that they are parallel with the door frame. Squat only within the   range that does not increase your knee pain. Never let your hips drop below your knees.  · Slowly return upright, pushing with your legs, not pulling with your hands.  Repeat __________ times. Complete this exercise __________ times per day.   STRENGTH - Quadriceps, Wall Slides   Follow guidelines for form closely. Increased knee pain often results from poorly placed feet or knees.  · Lean against a smooth wall or door and walk your feet out 18-24 inches. Place your feet hip-width apart.  · Slowly slide down the wall or door until your knees bend __________ degrees.* Keep your knees over your heels, not your toes, and in line with your hips, not falling to either side.  · Hold for __________ seconds. Stand up to rest for __________ seconds in between each repetition.  Repeat __________ times. Complete this exercise __________ times per day.  * Your physician, physical therapist, or athletic trainer will alter this angle based on your symptoms and progress.  Document Released: 02/25/2005 Document Revised: 08/28/2013 Document Reviewed: 07/26/2008  ExitCare® Patient Information ©2015 [employer]. This information is not  intended to replace advice given to you by your health care provider. Make sure you discuss any questions you have with your health care provider.    Knee Exercises  EXERCISES  RANGE OF MOTION (ROM) AND STRETCHING EXERCISES  These exercises may help you when beginning to rehabilitate your injury. Your symptoms may resolve with or without further involvement from your physician, physical therapist, or athletic trainer. While completing these exercises, remember:   · Restoring tissue flexibility helps normal motion to return to the joints. This allows healthier, less painful movement and activity.  · An effective stretch should be held for at least 30 seconds.  · A stretch should never be painful. You should only feel a gentle lengthening or release in the stretched tissue.  STRETCH - Knee Extension, Prone  · Lie on your stomach on a firm surface, such as a bed or countertop. Place your right / left knee and leg just beyond the edge of the surface. You may wish to place a towel under the far end of your right / left thigh for comfort.  · Relax your leg muscles and allow gravity to straighten your knee. Your clinician may advise you to add an ankle weight if more resistance is helpful for you.  · You should feel a stretch in the back of your right / left knee. Hold this position for __________ seconds.  Repeat __________ times. Complete this stretch __________ times per day.  * Your physician, physical therapist, or athletic trainer may ask you to add ankle weight to enhance your stretch.   RANGE OF MOTION - Knee Flexion, Active  · Lie on your back with both knees straight. (If this causes back discomfort, bend your opposite knee, placing your foot flat on the floor.)  · Slowly slide your heel back toward your buttocks until you feel a gentle stretch in the front of your knee or thigh.  · Hold for __________ seconds. Slowly slide your heel back to the starting position.  Repeat __________ times. Complete this exercise  __________ times per day.   STRETCH - Quadriceps, Prone   · Lie on your stomach on a firm surface, such as a bed or padded floor.  · Bend your right / left knee and grasp your ankle. If you are unable to reach your ankle or pant leg, use a belt around your foot to   lengthen your reach.  · Gently pull your heel toward your buttocks. Your knee should not slide out to the side. You should feel a stretch in the front of your thigh and/or knee.  · Hold this position for __________ seconds.  Repeat __________ times. Complete this stretch __________ times per day.   STRETCH - Hamstrings, Supine   · Lie on your back. Loop a belt or towel over the ball of your right / left foot.  · Straighten your right / left knee and slowly pull on the belt to raise your leg. Do not allow the right / left knee to bend. Keep your opposite leg flat on the floor.  · Raise the leg until you feel a gentle stretch behind your right / left knee or thigh. Hold this position for __________ seconds.  Repeat __________ times. Complete this stretch __________ times per day.   STRENGTHENING EXERCISES  These exercises may help you when beginning to rehabilitate your injury. They may resolve your symptoms with or without further involvement from your physician, physical therapist, or athletic trainer. While completing these exercises, remember:   · Muscles can gain both the endurance and the strength needed for everyday activities through controlled exercises.  · Complete these exercises as instructed by your physician, physical therapist, or athletic trainer. Progress the resistance and repetitions only as guided.  · You may experience muscle soreness or fatigue, but the pain or discomfort you are trying to eliminate should never worsen during these exercises. If this pain does worsen, stop and make certain you are following the directions exactly. If the pain is still present after adjustments, discontinue the exercise until you can discuss the trouble  with your clinician.  STRENGTH - Quadriceps, Isometrics  · Lie on your back with your right / left leg extended and your opposite knee bent.  · Gradually tense the muscles in the front of your right / left thigh. You should see either your knee cap slide up toward your hip or increased dimpling just above the knee. This motion will push the back of the knee down toward the floor/mat/bed on which you are lying.  · Hold the muscle as tight as you can without increasing your pain for __________ seconds.  · Relax the muscles slowly and completely in between each repetition.  Repeat __________ times. Complete this exercise __________ times per day.   STRENGTH - Quadriceps, Short Arcs   · Lie on your back. Place a __________ inch towel roll under your knee so that the knee slightly bends.  · Raise only your lower leg by tightening the muscles in the front of your thigh. Do not allow your thigh to rise.  · Hold this position for __________ seconds.  Repeat __________ times. Complete this exercise __________ times per day.   OPTIONAL ANKLE WEIGHTS: Begin with ____________________, but DO NOT exceed ____________________. Increase in 1 pound/0.5 kilogram increments.   STRENGTH - Quadriceps, Straight Leg Raises   Quality counts! Watch for signs that the quadriceps muscle is working to insure you are strengthening the correct muscles and not "cheating" by substituting with healthier muscles.  · Lay on your back with your right / left leg extended and your opposite knee bent.  · Tense the muscles in the front of your right / left thigh. You should see either your knee cap slide up or increased dimpling just above the knee. Your thigh may even quiver.  · Tighten these muscles even more and raise your   leg 4 to 6 inches off the floor. Hold for __________ seconds.  · Keeping these muscles tense, lower your leg.  · Relax the muscles slowly and completely in between each repetition.  Repeat __________ times. Complete this exercise  __________ times per day.   STRENGTH - Hamstring, Curls  · Lay on your stomach with your legs extended. (If you lay on a bed, your feet may hang over the edge.)  · Tighten the muscles in the back of your thigh to bend your right / left knee up to 90 degrees. Keep your hips flat on the bed/floor.  · Hold this position for __________ seconds.  · Slowly lower your leg back to the starting position.  Repeat __________ times. Complete this exercise __________ times per day.   OPTIONAL ANKLE WEIGHTS: Begin with ____________________, but DO NOT exceed ____________________. Increase in 1 pound/0.5 kilogram increments.   STRENGTH - Quadriceps, Squats  · Stand in a door frame so that your feet and knees are in line with the frame.  · Use your hands for balance, not support, on the frame.  · Slowly lower your weight, bending at the hips and knees. Keep your lower legs upright so that they are parallel with the door frame. Squat only within the range that does not increase your knee pain. Never let your hips drop below your knees.  · Slowly return upright, pushing with your legs, not pulling with your hands.  Repeat __________ times. Complete this exercise __________ times per day.   STRENGTH - Quadriceps, Wall Slides   Follow guidelines for form closely. Increased knee pain often results from poorly placed feet or knees.  · Lean against a smooth wall or door and walk your feet out 18-24 inches. Place your feet hip-width apart.  · Slowly slide down the wall or door until your knees bend __________ degrees.* Keep your knees over your heels, not your toes, and in line with your hips, not falling to either side.  · Hold for __________ seconds. Stand up to rest for __________ seconds in between each repetition.  Repeat __________ times. Complete this exercise __________ times per day.  * Your physician, physical therapist, or athletic trainer will alter this angle based on your symptoms and progress.  Document Released: 02/25/2005  Document Revised: 08/28/2013 Document Reviewed: 07/26/2008  ExitCare® Patient Information ©2015 [employer]. This information is not intended to replace advice given to you by your health care provider. Make sure you discuss any questions you have with your health care provider.

## 2013-11-28 NOTE — Progress Notes (Signed)
Pre visit review using our clinic review tool, if applicable. No additional management support is needed unless otherwise documented below in the visit note. 

## 2013-11-29 ENCOUNTER — Institutional Professional Consult (permissible substitution): Payer: Self-pay | Admitting: Family Medicine

## 2013-12-04 ENCOUNTER — Ambulatory Visit: Payer: Self-pay | Admitting: Family Medicine

## 2013-12-06 ENCOUNTER — Ambulatory Visit (INDEPENDENT_AMBULATORY_CARE_PROVIDER_SITE_OTHER)
Admission: RE | Admit: 2013-12-06 | Discharge: 2013-12-06 | Disposition: A | Discharge status: Home / Self Care | Payer: Self-pay | Source: Ambulatory Visit | Attending: Family Medicine | Admitting: Family Medicine

## 2013-12-06 ENCOUNTER — Ambulatory Visit (INDEPENDENT_AMBULATORY_CARE_PROVIDER_SITE_OTHER): Payer: Self-pay | Admitting: Family Medicine

## 2013-12-06 ENCOUNTER — Encounter: Payer: Self-pay | Admitting: Family Medicine

## 2013-12-06 VITALS — BP 140/90 | HR 79 | Temp 98.2°F | Ht 79.0 in | Wt >= 6400 oz

## 2013-12-06 DIAGNOSIS — M25562 Pain in left knee: Secondary | ICD-10-CM

## 2013-12-06 DIAGNOSIS — M2392 Unspecified internal derangement of left knee: Secondary | ICD-10-CM

## 2013-12-06 DIAGNOSIS — M2390 Unspecified internal derangement of unspecified knee: Secondary | ICD-10-CM

## 2013-12-06 DIAGNOSIS — M25569 Pain in unspecified knee: Secondary | ICD-10-CM

## 2013-12-06 MED ORDER — TRAMADOL HCL 50 MG PO TABS: 50.0000 mg | ORAL_TABLET | Freq: Four times a day (QID) | ORAL | Status: DC | PRN

## 2013-12-06 NOTE — Progress Notes (Signed)
12/06/2013    ID: Randy [name]   MRN: [id number]  DOB: 1971-04-26  Primary Physician:  Eustaquio BoydenJavier Gutierrez, MD  Chief Complaint: Knee Pain  Subjective:   History of Present Illness:  This 43 y.o. male patient presents with 2 1/2 day h/o L sided knee pain after slipping in some water at work. No audible pop was heard. The patient has not had an effusion. + symptomatic giving-way. No mechanical clicking. Joint has not locked up. Patient has been able to walk but is limping. The patient does have pain going up and down stairs or rising from a seated position.   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  2-3 weeks ago, slipped in the water. No swelling.  Renal insufficiency precludes the use of NSAIDS.  Pain location: medial and interior Current physical activity: minimal, just with family Prior Knee Surgery: none Current pain meds: Tylenol Bracing: none Occupation or school level: Collegiate, now corporate  The PMH, PSH, Social History, Family History, Medications, and allergies [facility], and have been updated if relevant.  Outpatient Encounter Prescriptions as of 12/06/2013  Medication Sig  . furosemide (LASIX) 20 MG tablet TAKE 1 TABLET BY MOUTH ONCE A DAY  . glimepiride (AMARYL) 2 MG tablet Take 1 tablet (2 mg total) by mouth daily before breakfast.  . hydrALAZINE (APRESOLINE) 50 MG tablet TAKE 1 TABLET BY MOUTH EVERY 8 HOURS  . labetalol (NORMODYNE) 300 MG tablet TAKE 1 TABLET BY MOUTH 2 TIMES A DAY  . losartan (COZAAR) 100 MG tablet TAKE 1 TABLET (100 MG TOTAL) BY MOUTH DAILY.  Marland Kitchen. spironolactone (ALDACTONE) 50 MG tablet TAKE 1 TABLET (50 MG TOTAL) BY MOUTH 2 (TWO) TIMES DAILY.  . traMADol (ULTRAM) 50 MG tablet Take 1 tablet (50 mg total) by mouth every 6 (six) hours as needed.    ROS: no acute illness or fever MSK: above GI: tol po intake without nausea or vomitting. Neuro: no numbness, tingling, or  radiculopathy O/w per hpi  Objective:   PHYSICAL EXAM  Blood pressure 140/90, pulse 79, temperature 98.2 F (36.8 C), temperature source Oral, height 6\' 7"  (2.007 m), weight 402 lb 12 oz (182.686 kg).  GEN: Well-developed,well-nourished,in no acute distress; alert,appropriate and cooperative throughout examination HEENT: Normocephalic and atraumatic without obvious abnormalities. Ears, externally no deformities PULM: Breathing comfortably in no respiratory distress EXT: No clubbing, cyanosis, or edema PSYCH: Normally interactive. Cooperative during the interview. Pleasant. Friendly and conversant. Not anxious or depressed appearing. Normal, full affect.  LEFT KNEE Gait: antalgia ROM: 0-105 Effusion: neg Echymosis or edema: none Patellar tendon NT Painful PLICA: neg Patellar grind: negative Medial and lateral patellar facet loading: negative medial and lateral joint lines: TTP MORE MEDIALLY Mcmurray's POS Flexion-pinch POS BOUNCE HOME IS POS Varus and valgus stress: stable Lachman: neg Ant and Post drawer: neg Hip abduction, IR, ER: WNL Hip flexion str: 5/5 Hip abd: 5/5 Quad: 5/5 VMO atrophy:No Hamstring concentric and eccentric: 5/5  Radiology: Dg Knee Ap/lat W/sunrise Left  12/06/2013   CLINICAL DATA:  Pain post trauma  EXAM: DG KNEE - 3 VIEWS  COMPARISON:  None.  FINDINGS: Weightbearing frontal, weight-bearing lateral, and sunrise patellar images were obtained. There is no fracture, dislocation, or effusion. Joint spaces appear intact. There is a spur arising from the anterior inferior patella. No erosive change.  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 No appreciable joint  space narrowing. No fracture or effusion.   Electronically Signed   By: Bretta Bang M.D.   On: 12/06/2013 17:05   The radiological images were independently reviewed by myself in the office and results were reviewed with the patient. My  independent interpretation of images:  No significant OA, no fracture, no dislocation. The Calcific change at inferior patella would not be clinically significant.   Assessment & Plan:   Left knee pain - Plan: DG Knee AP/LAT W/Sunrise Left  Internal derangement of left knee   Discussed different options, given time length, hopefully more along lines of contusion, meniscal contusion, but his exam is very worrisome for meniscal tear, which we discussed. Ligaments appear intact. With his age, classic exam, discussed that advanced imaging would be appropriate even in this time frame, but he agrees that giving it a little more time with injection is also completely reasonable.   No NSAIDS. Ice, Tylenol. Ultram if needed. Modify activity.  Knee Injection, LEFT Patient verbally consented to procedure. Risks (including potential rare risk of infection), benefits, and alternatives explained. Sterilely prepped with Chloraprep. Ethyl cholride used for anesthesia. 8 cc Lidocaine 1% mixed with Depo-Medrol 80 mg injected using the anteromedial approach without difficulty. No complications with procedure and tolerated well. Patient had decreased pain post-injection.   New Prescriptions   TRAMADOL (ULTRAM) 50 MG TABLET    Take 1 tablet (50 mg total) by mouth every 6 (six) hours as needed.   Orders Placed This Encounter  Procedures  . DG Knee AP/LAT W/Sunrise Left   Follow-up: Return in about 1 month (around 01/06/2014). Unless noted above, the patient is to follow-up if symptoms worsen. Red flags were reviewed with the patient.  Signed,  Elpidio Galea. Shawnita Krizek, MD, CAQ Sports Medicine  [facility] [address] [place], [address] Phone: [phone] Fax: [phone]

## 2013-12-06 NOTE — Progress Notes (Signed)
Pre visit review using our clinic review tool, if applicable. No additional management support is needed unless otherwise documented below in the visit note. 

## 2013-12-13 ENCOUNTER — Ambulatory Visit: Payer: Self-pay | Admitting: Family Medicine

## 2013-12-13 ENCOUNTER — Other Ambulatory Visit: Payer: Self-pay | Admitting: *Deleted

## 2013-12-19 ENCOUNTER — Telehealth: Payer: Self-pay | Admitting: Family Medicine

## 2013-12-19 ENCOUNTER — Ambulatory Visit: Payer: Self-pay | Admitting: Family Medicine

## 2013-12-19 DIAGNOSIS — Z0289 Encounter for other administrative examinations: Secondary | ICD-10-CM

## 2013-12-19 NOTE — Telephone Encounter (Signed)
Patient did not come for their scheduled appointment today.  Please let me know if the patient needs to be contacted immediately for follow up or if no follow up is necessary.   ° °

## 2013-12-21 NOTE — Telephone Encounter (Signed)
Robin, Dr. Sharen Hones would like a no show letter to be sent to the patient. Thanks, [employer]

## 2013-12-21 NOTE — Telephone Encounter (Signed)
pt has no showed 2 office visits just this month and late cancelled another. Adrienne, please send no show letter. Kim, plz reschedule appt.

## 2013-12-21 NOTE — Telephone Encounter (Signed)
Spoke with patient and rescheduled appt. He said he got laid off last week and was afraid he didn't have insurance anymore and was afraid he would get stuck with a bill he couldn't pay. I told him he should have insurance at least through the end of the month and if not, that we had a patient assistance program that he could look into. He verbalized understanding.

## 2013-12-25 ENCOUNTER — Encounter: Payer: Self-pay | Admitting: Family Medicine

## 2013-12-25 ENCOUNTER — Ambulatory Visit (INDEPENDENT_AMBULATORY_CARE_PROVIDER_SITE_OTHER): Payer: Self-pay | Admitting: Family Medicine

## 2013-12-25 VITALS — BP 148/100 | HR 68 | Temp 97.9°F | Wt >= 6400 oz

## 2013-12-25 DIAGNOSIS — IMO0002 Reserved for concepts with insufficient information to code with codable children: Secondary | ICD-10-CM

## 2013-12-25 DIAGNOSIS — N183 Chronic kidney disease, stage 3 unspecified: Secondary | ICD-10-CM

## 2013-12-25 DIAGNOSIS — IMO0001 Reserved for inherently not codable concepts without codable children: Secondary | ICD-10-CM

## 2013-12-25 DIAGNOSIS — N259 Disorder resulting from impaired renal tubular function, unspecified: Secondary | ICD-10-CM

## 2013-12-25 DIAGNOSIS — E1165 Type 2 diabetes mellitus with hyperglycemia: Principal | ICD-10-CM

## 2013-12-25 DIAGNOSIS — R809 Proteinuria, unspecified: Secondary | ICD-10-CM

## 2013-12-25 DIAGNOSIS — E1129 Type 2 diabetes mellitus with other diabetic kidney complication: Secondary | ICD-10-CM

## 2013-12-25 DIAGNOSIS — I509 Heart failure, unspecified: Secondary | ICD-10-CM

## 2013-12-25 DIAGNOSIS — I5022 Chronic systolic (congestive) heart failure: Secondary | ICD-10-CM

## 2013-12-25 DIAGNOSIS — Z23 Encounter for immunization: Secondary | ICD-10-CM

## 2013-12-25 DIAGNOSIS — I129 Hypertensive chronic kidney disease with stage 1 through stage 4 chronic kidney disease, or unspecified chronic kidney disease: Secondary | ICD-10-CM

## 2013-12-25 DIAGNOSIS — I130 Hypertensive heart and chronic kidney disease with heart failure and stage 1 through stage 4 chronic kidney disease, or unspecified chronic kidney disease: Secondary | ICD-10-CM

## 2013-12-25 NOTE — Assessment & Plan Note (Signed)
H/o nonischemic CM - continue to monitor.

## 2013-12-25 NOTE — Assessment & Plan Note (Signed)
bp improved with return of regular medication use. Continue regimen. F/u in 2 mo.

## 2013-12-25 NOTE — Patient Instructions (Addendum)
Pneumonia shot today (pneumovax). Check sugars once a day - either fasting or 2 hours after a meal.  Keep a log these numbers and drop off in 2 weeks to review.  Good to see you today, call us with questions. Return to see me in 2 months for follow up.

## 2013-12-25 NOTE — Addendum Note (Signed)
Addended by: Josph Macho A on: 12/25/2013 03:41 PM   Modules accepted: Orders

## 2013-12-25 NOTE — Assessment & Plan Note (Signed)
Reviewed sugars with patient - endorses better control with Amaryl Reviewed fasting and 2h post prandial goals. Log of sugars printed for pt to return in 2 wks to again review control. RTC 2 mo f/u. Pt agrees with plan.

## 2013-12-25 NOTE — Assessment & Plan Note (Addendum)
Chronic, stable. Latest Cr 1.6. Goal BP actually <135/85

## 2013-12-25 NOTE — Progress Notes (Signed)
Pre visit review using our clinic review tool, if applicable. No additional management support is needed unless otherwise documented below in the visit note. 

## 2013-12-25 NOTE — Progress Notes (Signed)
BP 148/100  Pulse 68  Temp(Src) 97.9 F (36.6 C) (Oral)  Wt 403 lb 4 oz (182.913 kg)   CC: 1 mo f/u  Subjective:    Patient ID: Randy [name], male    DOB: 17-Mar-1971, 43 y.o.   MRN: [id number]  HPI: Randy [name] is a 43 y.o. male presenting on 12/25/2013 for Follow-up   Has missed/[facility] several appts recently. H/o nonischemic CM.  DM - regularly does not check sugars. About 3x/wk, 80 yesterday. Checks randomly during the day.  Compliant with antihyperglycemic regimen which includes: amaryl .  Denies low sugars or hypoglycemic symptoms.  Denies paresthesias. Last diabetic eye exam YEARS, DUE.  Pneumovax: Today.  Prevnar: not done.  HTN - Compliant with current antihypertensive regimen of lasix  daily, hydralazine  TID, labetalol  bid, losartan  daily, and spironoactone  twice daily.  Does not check blood pressures at home.  No low blood pressure symptoms of dizziness/syncope.  Denies HA, vision changes, CP/tightness, SOB, leg swelling.    Wt Readings from Last 3 Encounters:  12/25/13 403 lb 4 oz (182.913 kg)  12/06/13 402 lb 12 oz (182.686 kg)  11/28/13 404 lb (183.253 kg)  Body mass index is 45.41 kg/(m^2).    Relevant past medical, surgical, family and social history reviewed and updated as indicated.  Allergies and medications reviewed and updated. Current Outpatient Prescriptions on File Prior to Visit  Medication Sig  . furosemide (LASIX) 20 MG tablet TAKE 1 TABLET BY MOUTH ONCE A DAY  . glimepiride (AMARYL) 2 MG tablet Take 1 tablet (2 mg total) by mouth daily before breakfast.  . hydrALAZINE (APRESOLINE) 50 MG tablet TAKE 1 TABLET BY MOUTH EVERY 8 HOURS  . labetalol (NORMODYNE) 300 MG tablet TAKE 1 TABLET BY MOUTH 2 TIMES A DAY  . losartan (COZAAR) 100 MG tablet TAKE 1 TABLET (100 MG TOTAL) BY MOUTH DAILY.  Marland Kitchen spironolactone (ALDACTONE) 50 MG tablet TAKE 1 TABLET (50 MG TOTAL) BY MOUTH 2 (TWO) TIMES DAILY.  . traMADol (ULTRAM) 50 MG tablet Take  1 tablet (50 mg total) by mouth every 6 (six) hours as needed.   No current facility-administered medications on file prior to visit.    Review of Systems Per HPI unless specifically indicated above    Objective:    BP 148/100  Pulse 68  Temp(Src) 97.9 F (36.6 C) (Oral)  Wt 403 lb 4 oz (182.913 kg)  Physical Exam  Nursing note and vitals reviewed. Constitutional: He appears well-developed and well-nourished. No distress.  Morbidly obese  HENT:  Mouth/Throat: Oropharynx is clear and moist. No oropharyngeal exudate.  Cardiovascular: Normal rate, regular rhythm, normal heart sounds and intact distal pulses.   No murmur heard. Pulmonary/Chest: Effort normal and breath sounds normal. No respiratory distress. He has no wheezes. He has no rales.  Musculoskeletal: He exhibits no edema.  Skin: Skin is warm and dry. No rash noted.  Psychiatric: He has a normal mood and affect.   Results for orders placed in visit on 11/10/13  LIPID PANEL      Result Value Ref Range   Cholesterol 130  0 - 200 mg/dL   Triglycerides 782.9  0.0 - 149.0 mg/dL   HDL 56.21 (*) >30.86 mg/dL   VLDL 57.8  0.0 - 46.9 mg/dL   LDL Cholesterol 69  0 - 99 mg/dL   Total CHOL/HDL Ratio 4     NonHDL 95.50    COMPREHENSIVE METABOLIC PANEL      Result Value Ref  Range   Sodium 135  135 - 145 mEq/L   Potassium 4.0  3.5 - 5.1 mEq/L   Chloride 103  96 - 112 mEq/L   CO2 23  19 - 32 mEq/L   Glucose, Bld 254 (*) 70 - 99 mg/dL   BUN 14  6 - 23 mg/dL   Creatinine, Ser 1.6 (*) 0.4 - 1.5 mg/dL   Total Bilirubin 0.4  0.2 - 1.2 mg/dL   Alkaline Phosphatase 64  39 - 117 U/L   AST 20  0 - 37 U/L   ALT 23  0 - 53 U/L   Total Protein 7.4  6.0 - 8.3 g/dL   Albumin 3.8  3.5 - 5.2 g/dL   Calcium 9.4  8.4 - 08.8 mg/dL   GFR 11.03  >15.94 mL/min  HEMOGLOBIN A1C      Result Value Ref Range   Hemoglobin A1C 12.5 (*) 4.6 - 6.5 %  MICROALBUMIN / CREATININE URINE RATIO      Result Value Ref Range   Microalb, Ur 79.0 (*) 0.0 -  1.9 mg/dL   Creatinine,U 585.9     Microalb Creat Ratio 33.5 (*) 0.0 - 30.0 mg/g      Assessment & Plan:   Problem List Items Addressed This Visit   Uncontrolled type 2 diabetes mellitus with microalbuminuria - Primary     Reviewed sugars with patient - endorses better control with Amaryl Reviewed fasting and 2h post prandial goals. Log of sugars printed for pt to return in 2 wks to again review control. RTC 2 mo f/u. Pt agrees with plan.     Systolic CHF     H/o nonischemic CM - continue to monitor.    RENAL INSUFFICIENCY     Chronic, stable. Latest Cr 1.6. 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 Continue regimen. F/u in 2 mo.        Follow up plan: Return in about 2 months (around 02/24/2014), or as needed, for follow up visit.

## 2014-01-03 ENCOUNTER — Telehealth: Payer: Self-pay

## 2014-01-03 NOTE — Telephone Encounter (Signed)
Agreeable plan of care.

## 2014-01-03 NOTE — Telephone Encounter (Signed)
Pt was seen 12/06/13 and lt knee pain is worsened; Lyla Son will schedule pt for 30 min f/u appt as noted in 12/06/13 note.

## 2014-01-08 ENCOUNTER — Encounter: Payer: Self-pay | Admitting: Family Medicine

## 2014-01-08 ENCOUNTER — Encounter (INDEPENDENT_AMBULATORY_CARE_PROVIDER_SITE_OTHER): Payer: Self-pay

## 2014-01-08 ENCOUNTER — Ambulatory Visit (INDEPENDENT_AMBULATORY_CARE_PROVIDER_SITE_OTHER): Payer: Worker's Compensation | Admitting: Family Medicine

## 2014-01-08 VITALS — BP 180/100 | HR 79 | Temp 98.1°F | Ht 79.0 in | Wt >= 6400 oz

## 2014-01-08 DIAGNOSIS — I130 Hypertensive heart and chronic kidney disease with heart failure and stage 1 through stage 4 chronic kidney disease, or unspecified chronic kidney disease: Secondary | ICD-10-CM

## 2014-01-08 DIAGNOSIS — M25569 Pain in unspecified knee: Secondary | ICD-10-CM

## 2014-01-08 DIAGNOSIS — I509 Heart failure, unspecified: Secondary | ICD-10-CM

## 2014-01-08 DIAGNOSIS — N183 Chronic kidney disease, stage 3 unspecified: Secondary | ICD-10-CM

## 2014-01-08 DIAGNOSIS — M25562 Pain in left knee: Secondary | ICD-10-CM

## 2014-01-08 DIAGNOSIS — I129 Hypertensive chronic kidney disease with stage 1 through stage 4 chronic kidney disease, or unspecified chronic kidney disease: Secondary | ICD-10-CM

## 2014-01-08 NOTE — Progress Notes (Signed)
01/08/2014    ID: Randy [name]   MRN: [id number]  DOB: 11-14-1970  Primary Physician:  Eustaquio Boyden, MD  Chief Complaint: Follow-up  Subjective:   History of Present Illness:  Pleasant 400 pound male who forgot his BP meds today presents for f/u knee pain. His knee is not getting any better. He had improvement for 1 week after his steroid injection only, and now is having symptomatic giving way and pain. No locking up of the joint.  12/06/2013 Last OV with Hannah Beat, MD  This 43 y.o. male patient presents with 2 1/2 day h/o L sided knee pain after slipping in some water at work. No audible pop was heard. The patient has not had an effusion. + symptomatic giving-way. No mechanical clicking. Joint has not locked up. Patient has been able to walk but is limping. The patient does have pain going up and down stairs or rising from a seated position.   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  2-3 weeks ago, slipped in the water. No swelling.  Renal insufficiency precludes the use of NSAIDS.  Pain location: medial and interior Current physical activity: minimal, just with family Prior Knee Surgery: none Current pain meds: Tylenol Bracing: none Occupation or school level: Collegiate, now corporate  The PMH, PSH, Social History, Family History, Medications, and allergies have been reviewed in [facility], and have been updated if relevant.  Outpatient Encounter Prescriptions as of 01/08/2014  Medication Sig  . furosemide (LASIX) 20 MG tablet TAKE 1 TABLET BY MOUTH ONCE A DAY  . glimepiride (AMARYL) 2 MG tablet Take 1 tablet (2 mg total) by mouth daily before breakfast.  . hydrALAZINE (APRESOLINE) 50 MG tablet TAKE 1 TABLET BY MOUTH EVERY 8 HOURS  . labetalol (NORMODYNE) 300 MG tablet TAKE 1 TABLET BY MOUTH 2 TIMES A DAY  . losartan (COZAAR) 100 MG tablet TAKE 1 TABLET (100 MG TOTAL) BY MOUTH DAILY.  Marland Kitchen spironolactone (ALDACTONE)  50 MG tablet TAKE 1 TABLET (50 MG TOTAL) BY MOUTH 2 (TWO) TIMES DAILY.  . traMADol (ULTRAM) 50 MG tablet Take 1 tablet (50 mg total) by mouth every 6 (six) hours as needed.    ROS: no acute illness or fever MSK: above GI: tol po intake without nausea or vomitting. Neuro: no numbness, tingling, or radiculopathy O/w per hpi  Objective:   PHYSICAL EXAM  Blood pressure 180/100, pulse 79, temperature 98.1 F (36.7 C), temperature source Oral, height 6\' 7"  (2.007 m), weight 415 lb 8 oz (188.47 kg).  GEN: Well-developed,well-nourished,in no acute distress; alert,appropriate and cooperative throughout examination HEENT: Normocephalic and atraumatic without obvious abnormalities. Ears, externally no deformities PULM: Breathing comfortably in no respiratory distress EXT: No clubbing, cyanosis, or edema PSYCH: Normally interactive. Cooperative during the interview. Pleasant. Friendly and conversant. Not anxious or depressed appearing. Normal, full affect.  LEFT KNEE Gait: antalgia ROM: 0-105 Effusion: neg Echymosis or edema: none Patellar tendon NT Painful PLICA: neg Patellar grind: negative Medial and lateral patellar facet loading: negative medial and lateral joint lines: TTP MORE MEDIALLY Mcmurray's POS Flexion-pinch POS BOUNCE HOME IS POS Varus and valgus stress: stable Lachman: neg Ant and Post drawer: neg Hip abduction, IR, ER: WNL Hip flexion str: 5/5 Hip abd: 5/5 Quad: 5/5 VMO atrophy:No Hamstring concentric and eccentric: 5/5  Radiology: CLINICAL DATA: Pain post trauma  EXAM:  DG KNEE - 3 VIEWS  COMPARISON: None.  FINDINGS:  Weightbearing frontal, weight-bearing  lateral, and sunrise patellar  images were obtained. There is no fracture, dislocation, or  effusion. Joint spaces appear intact. There is a spur arising from  the anterior inferior patella. No erosive change.  IMPRESSION:  Evidence of patellar tendinosis with calcification along the  inferior anterior  patella. No appreciable joint space narrowing. No  fracture or effusion.  Electronically Signed  By: Bretta Bang M.D.  On: 12/06/2013 17:05   The radiological images were independently reviewed by myself in the office and results were reviewed with the patient. My independent interpretation of images:  No significant OA, no fracture, no dislocation. The Calcific change at inferior patella would not be clinically significant.   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 Obtain an [facility] at [facility] given size and weight limitations on large table to evaluate for meniscal tear.   BP, very high today. He forgot to take meds - take when he gets home.   Orders Placed This Encounter  Procedures  . MR Knee Left  Wo Contrast   Follow-up: No Follow-up on file. Unless noted above, the patient is to follow-up if symptoms worsen. Red flags were reviewed with the patient.  Signed,  Elpidio Galea. Ellanore Vanhook, MD, CAQ Sports Medicine  [facility] [address] [place], [address] Phone: [phone] Fax: [phone]

## 2014-01-08 NOTE — Patient Instructions (Signed)

## 2014-01-08 NOTE — Progress Notes (Signed)
Pre visit review using our clinic review tool, if applicable. No additional management support is needed unless otherwise documented below in the visit note. 

## 2014-01-16 ENCOUNTER — Telehealth: Payer: Self-pay

## 2014-01-16 NOTE — Telephone Encounter (Signed)
Pt request all med refills switched from [facility] to [facility] [address]; advised pt to contact [facility] and request names of meds pt wants transferred and [facility] will take care of transfer process. Pt voiced understanding.

## 2014-01-22 ENCOUNTER — Ambulatory Visit (HOSPITAL_COMMUNITY): Admission: RE | Admit: 2014-01-22 | Payer: Self-pay | Source: Ambulatory Visit

## 2014-06-08 ENCOUNTER — Ambulatory Visit: Payer: Self-pay | Admitting: Family Medicine

## 2014-07-10 ENCOUNTER — Other Ambulatory Visit: Payer: Self-pay | Admitting: Family Medicine

## 2014-07-24 ENCOUNTER — Telehealth: Payer: Self-pay | Admitting: *Deleted

## 2014-07-24 NOTE — Telephone Encounter (Signed)
LMOM in reference to flu vaccine.

## 2014-08-18 ENCOUNTER — Other Ambulatory Visit: Payer: Self-pay | Admitting: Family Medicine

## 2014-08-24 MED ORDER — GLIMEPIRIDE 2 MG PO TABS: ORAL_TABLET | ORAL | Status: DC

## 2014-08-24 NOTE — Addendum Note (Signed)
Addended by: Eustaquio Boyden on: 08/24/2014 01:27 PM   Modules accepted: Orders

## 2014-08-24 NOTE — Telephone Encounter (Signed)
Sent in

## 2014-08-24 NOTE — Telephone Encounter (Signed)
Request for refill was denied until patient is seen.  He called stating he currently is without insurance and would have to pay for visit out of pocket.  He is unable to do so at this time as he is already paying for meds out of pocket.  He is requesting a one month supply to allow time to obtain insurance.  Please advise.

## 2014-10-01 ENCOUNTER — Other Ambulatory Visit: Payer: Self-pay | Admitting: Family Medicine

## 2014-10-19 ENCOUNTER — Telehealth: Payer: Self-pay

## 2014-10-19 NOTE — Telephone Encounter (Signed)
Diabetic Bundle. Left voicemail advising pt hisr A1C blood test is due. Pt advised to contact PCP's office to schedule.

## 2014-10-25 ENCOUNTER — Ambulatory Visit: Payer: Self-pay | Admitting: Family Medicine

## 2014-10-30 ENCOUNTER — Telehealth: Payer: Self-pay | Admitting: Family Medicine

## 2014-10-30 ENCOUNTER — Ambulatory Visit: Payer: Self-pay | Admitting: Family Medicine

## 2014-10-30 DIAGNOSIS — Z0289 Encounter for other administrative examinations: Secondary | ICD-10-CM

## 2014-10-30 NOTE — Telephone Encounter (Signed)
plz call pt and schedule f/u visit as he's overdue.

## 2014-10-30 NOTE — Telephone Encounter (Signed)
Patient did not come in for their appointment today for follow up.  Please let me know if patient needs to be contacted immediately for follow up or no follow up needed. °

## 2014-10-31 NOTE — Telephone Encounter (Signed)
Lm for pt to call back

## 2014-10-31 NOTE — Telephone Encounter (Signed)
Appointment 8/2 Pt aware

## 2014-11-27 ENCOUNTER — Encounter: Payer: Self-pay | Admitting: Family Medicine

## 2014-11-27 ENCOUNTER — Ambulatory Visit (INDEPENDENT_AMBULATORY_CARE_PROVIDER_SITE_OTHER): Payer: Self-pay | Admitting: Family Medicine

## 2014-11-27 VITALS — BP 180/126 | HR 72 | Temp 98.1°F | Wt >= 6400 oz

## 2014-11-27 DIAGNOSIS — N183 Chronic kidney disease, stage 3 unspecified: Secondary | ICD-10-CM

## 2014-11-27 DIAGNOSIS — I509 Heart failure, unspecified: Secondary | ICD-10-CM

## 2014-11-27 DIAGNOSIS — E1129 Type 2 diabetes mellitus with other diabetic kidney complication: Secondary | ICD-10-CM

## 2014-11-27 DIAGNOSIS — R809 Proteinuria, unspecified: Secondary | ICD-10-CM

## 2014-11-27 DIAGNOSIS — J069 Acute upper respiratory infection, unspecified: Secondary | ICD-10-CM

## 2014-11-27 DIAGNOSIS — IMO0002 Reserved for concepts with insufficient information to code with codable children: Secondary | ICD-10-CM

## 2014-11-27 DIAGNOSIS — N289 Disorder of kidney and ureter, unspecified: Secondary | ICD-10-CM

## 2014-11-27 DIAGNOSIS — I130 Hypertensive heart and chronic kidney disease with heart failure and stage 1 through stage 4 chronic kidney disease, or unspecified chronic kidney disease: Secondary | ICD-10-CM

## 2014-11-27 DIAGNOSIS — I5022 Chronic systolic (congestive) heart failure: Secondary | ICD-10-CM

## 2014-11-27 DIAGNOSIS — E1165 Type 2 diabetes mellitus with hyperglycemia: Secondary | ICD-10-CM

## 2014-11-27 DIAGNOSIS — I129 Hypertensive chronic kidney disease with stage 1 through stage 4 chronic kidney disease, or unspecified chronic kidney disease: Secondary | ICD-10-CM

## 2014-11-27 MED ORDER — LABETALOL HCL 300 MG PO TABS: ORAL_TABLET | ORAL | Status: DC

## 2014-11-27 MED ORDER — AZITHROMYCIN 250 MG PO TABS: ORAL_TABLET | ORAL | Status: DC

## 2014-11-27 MED ORDER — LOSARTAN POTASSIUM 100 MG PO TABS: ORAL_TABLET | ORAL | Status: DC

## 2014-11-27 MED ORDER — GLIMEPIRIDE 2 MG PO TABS
ORAL_TABLET | ORAL | Status: DC
Start: 2014-11-27 — End: 2015-03-27

## 2014-11-27 MED ORDER — FUROSEMIDE 20 MG PO TABS: ORAL_TABLET | ORAL | Status: DC

## 2014-11-27 NOTE — Assessment & Plan Note (Signed)
Chronic. Recheck renal panel today.

## 2014-11-27 NOTE — Progress Notes (Signed)
Pre visit review using our clinic review tool, if applicable. No additional management support is needed unless otherwise documented below in the visit note. 

## 2014-11-27 NOTE — Assessment & Plan Note (Signed)
With otitis media - anticipate viral etiology but given comorbidities provided with zpack to fill in case not improving as expected. Discussed ok to use corcedin HBP OTC prn, continue to push fluids and rest. Update if not improving with treatment plan.

## 2014-11-27 NOTE — Patient Instructions (Addendum)
Return for physical when you newly get insurance (around November or December) Zpack antibiotic to hold - fill if no improvement over next few days, or fever or worsening cough or ear pain. Restart lasix, losartan, labetalol, and amaryl - meds refilled to pharmacy.  Hold hydralazine and spironolactone for now.

## 2014-11-27 NOTE — Assessment & Plan Note (Signed)
H/o nonischemic CM. Last EF 40-45% 2011. Will need this updated.

## 2014-11-27 NOTE — Progress Notes (Signed)
BP 180/126 mmHg  Pulse 72  Temp(Src) 98.1 F (36.7 C) (Oral)  Wt 409 lb 8 oz (185.748 kg)   CC: med refill visit  Subjective:    Patient ID: Randy [name], male    DOB: Dec 30, 1970, 44 y.o.   MRN: [id number]  HPI: Randy [name] is a 43 y.o. male presenting on 11/27/2014 for Follow-up and Sinusitis   Last seen 12/25/2013.   Longstanding malignant hypertension, uncontrolled T2DM, nonischemic CM, CKD, h/o non adherence to med regimen.   Job change - lost insurance. Currently self pay. New insurance starting this November.   Off all meds for last 2 months. Drinking 1 gallon water per day. Cut out sweets and donuts. Stopped beer.   DM - of amaryl for last several months. Metformin not indicated due to kidney function.  Lab Results  Component Value Date   HGBA1C 12.5* 11/10/2013     HTN - off all meds for last few months. Does not check blood pressures at home.  No low blood pressure readings or symptoms of dizziness/syncope.  Denies HA, vision changes, CP/tightness, SOB, leg swelling.   For 3 days with headache, congestion, rhinorrhea. Cough productive of white sputum. No fevers. No sick contacts at home. No h/o asthma, smoking. Chest > head congestion. + L>R earache.  Some blood in stool - ? Hemorrhoids. Attributes to spironolactone as symptom resolved off med. No fmhx colon cancer.  Relevant past medical, surgical, family and social history reviewed and updated as indicated. Interim medical history since our last visit reviewed. Allergies and medications reviewed and updated. Current Outpatient Prescriptions on File Prior to Visit  Medication Sig  . hydrALAZINE (APRESOLINE) 50 MG tablet TAKE 1 TABLET BY MOUTH EVERY 8 HOURS (Patient not taking: Reported on 11/27/2014)  . spironolactone (ALDACTONE) 50 MG tablet TAKE 1 TABLET (50 MG TOTAL) BY MOUTH 2 (TWO) TIMES DAILY. (Patient not taking: Reported on 11/27/2014)  . traMADol (ULTRAM) 50 MG tablet Take 1 tablet (50 mg total) by mouth  every 6 (six) hours as needed. (Patient not taking: Reported on 11/27/2014)   No current facility-administered medications on file prior to visit.    Review of Systems Per HPI unless specifically indicated above     Objective:    BP 180/126 mmHg  Pulse 72  Temp(Src) 98.1 F (36.7 C) (Oral)  Wt 409 lb 8 oz (185.748 kg)  Wt Readings from Last 3 Encounters:  11/27/14 409 lb 8 oz (185.748 kg)  01/08/14 415 lb 8 oz (188.47 kg)  12/25/13 403 lb 4 oz (182.913 kg)   Body mass index is 46.11 kg/(m^2).  Physical Exam  Constitutional: He appears well-developed and well-nourished. No distress.  HENT:  Head: Normocephalic and atraumatic.  Right Ear: Hearing, external ear and ear canal normal. Tympanic membrane is erythematous.  Left Ear: Hearing, external ear and ear canal normal. Tympanic membrane is erythematous.  Nose: Nose normal. No mucosal edema or rhinorrhea. Right sinus exhibits no maxillary sinus tenderness and no frontal sinus tenderness. Left sinus exhibits no maxillary sinus tenderness and no frontal sinus tenderness.  Mouth/Throat: Uvula is midline, oropharynx is clear and moist and mucous membranes are normal. No oropharyngeal exudate, posterior oropharyngeal edema, posterior oropharyngeal erythema or tonsillar abscesses.  Erythematous TMs bilaterally L>R  Eyes: Conjunctivae and EOM are normal. Pupils are equal, round, and reactive to light. No scleral icterus.  Neck: Normal range of motion. Neck supple. No thyromegaly present.  Cardiovascular: Normal rate, regular rhythm, normal heart sounds and intact distal  pulses.   No murmur heard. Pulmonary/Chest: Effort normal and breath sounds normal. No respiratory distress. He has no wheezes. He has no rales.  Musculoskeletal: He exhibits no edema.  See HPI for foot exam if done  Lymphadenopathy:    He has no cervical adenopathy.  Skin: Skin is warm and dry. No rash noted.  Psychiatric: He has a normal mood and affect.  Nursing note  and vitals reviewed.     Assessment & Plan:   Problem List Items Addressed This Visit    Acute upper respiratory infection - Primary    With otitis media - anticipate viral etiology but given comorbidities provided with zpack to fill in case not improving as expected. Discussed ok to use corcedin HBP OTC prn, continue to push fluids and rest. Update if not improving with treatment plan.      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 Lost insurance. Self pay patient today.  Subsequently BP markedly elevated today. Restart meds including lasix  daily, labetalol  BID, losartan  daily. Hold hydralazine 50 TID and spironolactone 50 TID for now.  Advised to start one med every few days to ensure doesn't have sudden dangerous drop in bp. Pt agrees with paln.      Relevant Medications   furosemide (LASIX) 20 MG tablet   labetalol (NORMODYNE) 300 MG tablet   losartan (COZAAR) 100 MG tablet   Renal insufficiency    Chronic. Recheck renal panel today.      Systolic CHF    H/o nonischemic CM. Last EF 40-45% 2011. Will need this updated.      Relevant Medications   furosemide (LASIX) 20 MG tablet   labetalol (NORMODYNE) 300 MG tablet   losartan (COZAAR) 100 MG tablet   Uncontrolled type 2 diabetes mellitus with microalbuminuria    Reviewed last A1c with patient. Off amaryl - ran out months ago. Restart. Discussed need to take with breakfast to avoid hypoglycemia.  Check labs today.      Relevant Medications   glimepiride (AMARYL) 2 MG tablet   losartan (COZAAR) 100 MG tablet       Follow up plan: Return in about 3 months (around 02/27/2015), or as needed, for annual exam, prior fasting for blood work.   Renal panel, d LDL, A1c, CBC.

## 2014-11-27 NOTE — Assessment & Plan Note (Signed)
Off all meds for last 3 months - ran out. Lost insurance. Self pay patient today.  Subsequently BP markedly elevated today. Restart meds including lasix 20mg  daily, labetalol 300mg  BID, losartan 100mg  daily. Hold hydralazine 50 TID and spironolactone 50 TID for now.  Advised to start one med every few days to ensure doesn't have sudden dangerous drop in bp. Pt agrees with paln.

## 2014-11-27 NOTE — Assessment & Plan Note (Signed)
Reviewed last A1c with patient. Off amaryl - ran out months ago. Restart. Discussed need to take with breakfast to avoid hypoglycemia.  Check labs today.

## 2014-11-28 ENCOUNTER — Other Ambulatory Visit (INDEPENDENT_AMBULATORY_CARE_PROVIDER_SITE_OTHER): Payer: Self-pay

## 2014-11-28 DIAGNOSIS — I5022 Chronic systolic (congestive) heart failure: Secondary | ICD-10-CM

## 2014-11-28 DIAGNOSIS — N289 Disorder of kidney and ureter, unspecified: Secondary | ICD-10-CM

## 2014-11-28 LAB — CBC WITH DIFFERENTIAL/PLATELET
BASOS PCT: 0.3 % (ref 0.0–3.0)
Basophils Absolute: 0 10*3/uL (ref 0.0–0.1)
EOS PCT: 1.7 % (ref 0.0–5.0)
Eosinophils Absolute: 0.2 10*3/uL (ref 0.0–0.7)
HEMATOCRIT: 43.6 % (ref 39.0–52.0)
Hemoglobin: 13.9 g/dL (ref 13.0–17.0)
LYMPHS PCT: 36.3 % (ref 12.0–46.0)
Lymphs Abs: 3.9 10*3/uL (ref 0.7–4.0)
MCHC: 31.9 g/dL (ref 30.0–36.0)
MCV: 81 fl (ref 78.0–100.0)
MONO ABS: 0.8 10*3/uL (ref 0.1–1.0)
Monocytes Relative: 7.8 % (ref 3.0–12.0)
NEUTROS PCT: 53.9 % (ref 43.0–77.0)
Neutro Abs: 5.8 10*3/uL (ref 1.4–7.7)
Platelets: 286 10*3/uL (ref 150.0–400.0)
RBC: 5.38 Mil/uL (ref 4.22–5.81)
RDW: 15 % (ref 11.5–15.5)
WBC: 10.8 10*3/uL — ABNORMAL HIGH (ref 4.0–10.5)

## 2014-11-28 LAB — RENAL FUNCTION PANEL
Albumin: 3.9 g/dL (ref 3.5–5.2)
BUN: 20 mg/dL (ref 6–23)
CO2: 25 mEq/L (ref 19–32)
CREATININE: 1.41 mg/dL (ref 0.40–1.50)
Calcium: 9.3 mg/dL (ref 8.4–10.5)
Chloride: 101 mEq/L (ref 96–112)
GFR: 70.94 mL/min (ref >60.00)
Glucose, Bld: 298 mg/dL — ABNORMAL HIGH (ref 70–99)
PHOSPHORUS: 3.4 mg/dL (ref 2.3–4.6)
Potassium: 4.3 mEq/L (ref 3.5–5.1)
SODIUM: 134 meq/L — AB (ref 135–145)

## 2014-11-28 LAB — HEMOGLOBIN A1C: HEMOGLOBIN A1C: 12.5 % — AB (ref 4.6–6.5)

## 2014-11-28 LAB — LDL CHOLESTEROL, DIRECT: Direct LDL: 79 mg/dL

## 2014-11-28 NOTE — Addendum Note (Signed)
Addended by: Eustaquio Boyden on: 11/28/2014 01:32 PM   Modules accepted: Orders

## 2014-11-28 NOTE — Addendum Note (Signed)
Addended by: Baldomero Lamy on: 11/28/2014 09:23 AM   Modules accepted: Orders

## 2014-11-28 NOTE — Addendum Note (Signed)
Addended by: Liane Comber C on: 11/28/2014 02:05 PM   Modules accepted: Orders

## 2014-12-08 IMAGING — CR DG KNEE AP/LAT W/ SUNRISE*L*
3 series · 3 of 3 positions shown · non-contrast
Comparison: None.

CLINICAL DATA: Pain post trauma

EXAM:
DG KNEE - 3 VIEWS

[view not recorded (1 of 3)]
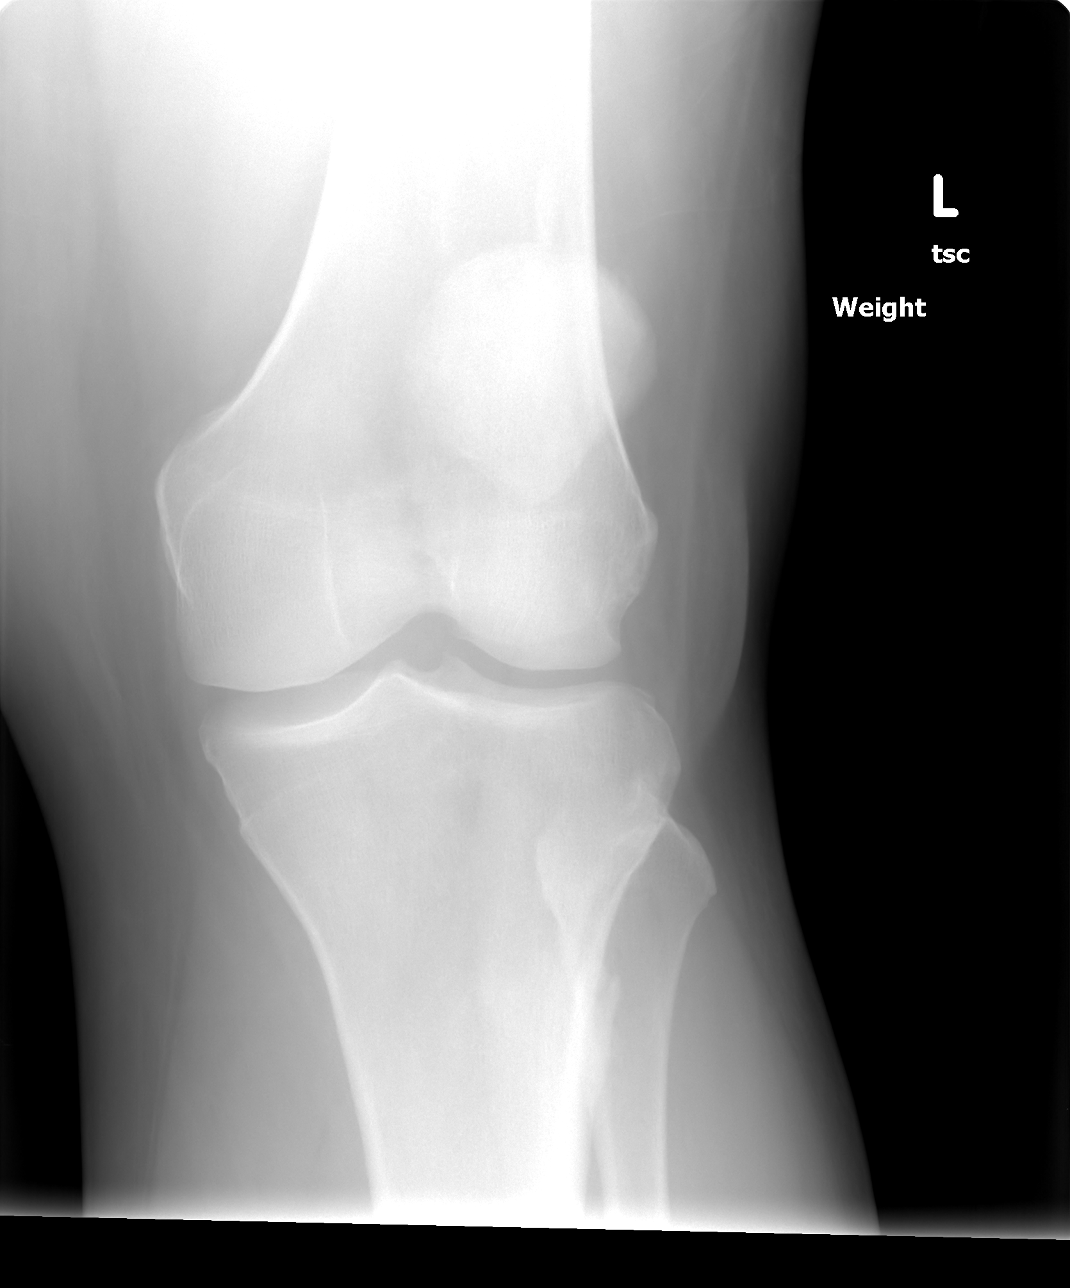

[view not recorded (2 of 3)]
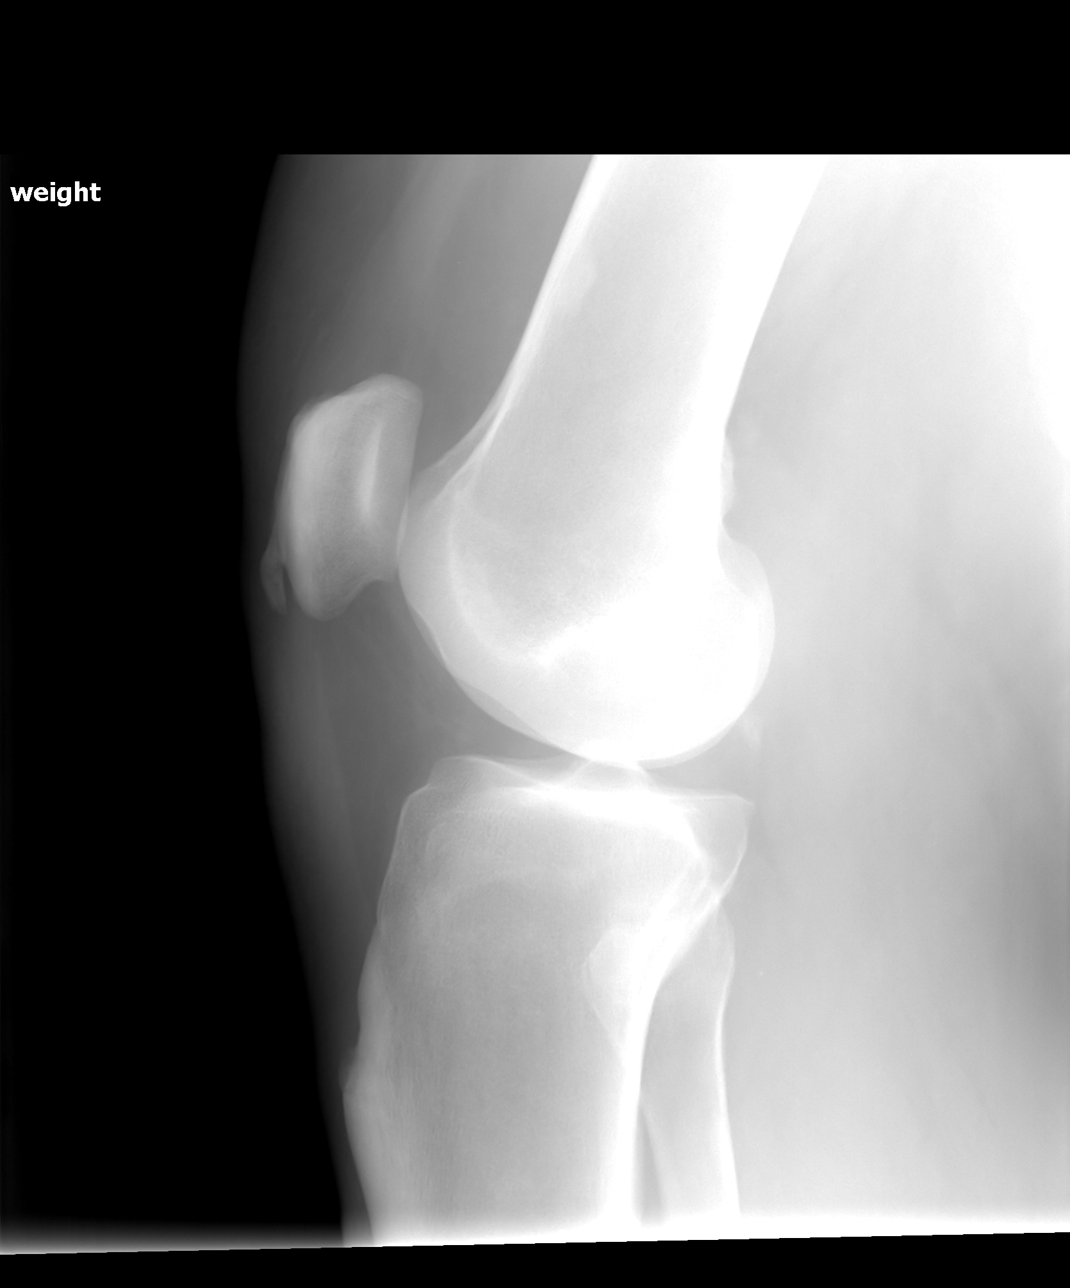

[view not recorded (3 of 3)]
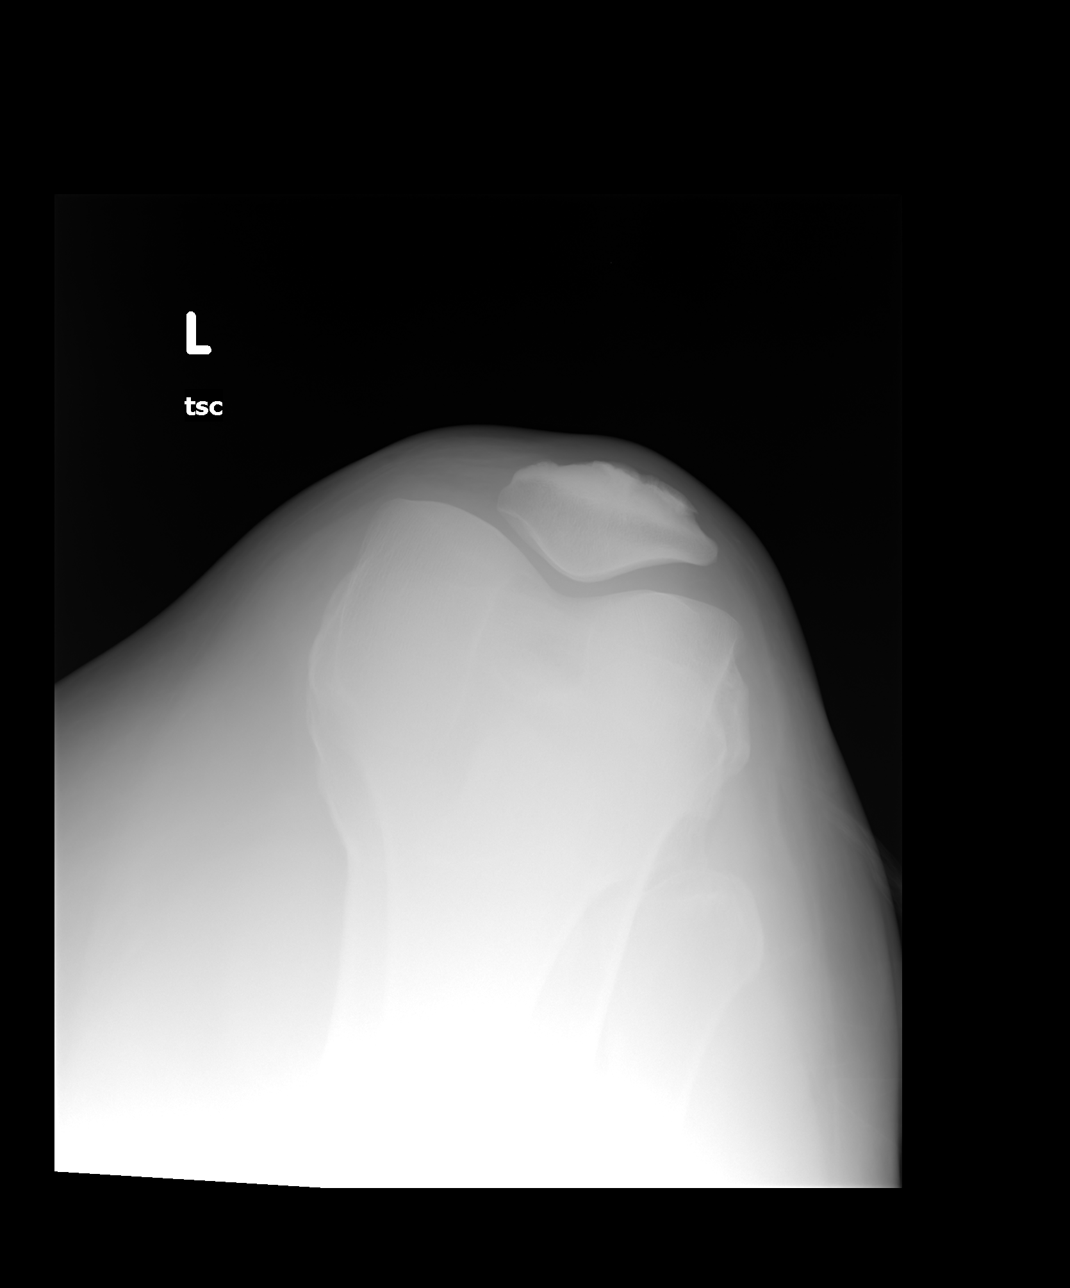

[3 of 3 positions shown; findings below may reference images not displayed]

FINDINGS: Weightbearing frontal, weight-bearing lateral, and sunrise patellar
images were obtained. There is no fracture, dislocation, or
effusion. Joint spaces appear intact. There is a spur arising from
the anterior inferior patella. No erosive change.
IMPRESSION: Evidence of patellar tendinosis with calcification along the
inferior anterior patella. No appreciable joint space narrowing. No
fracture or effusion.

## 2015-02-18 ENCOUNTER — Telehealth: Payer: Self-pay | Admitting: *Deleted

## 2015-02-18 MED ORDER — SPIRONOLACTONE 50 MG PO TABS: 50.0000 mg | ORAL_TABLET | Freq: Every day | ORAL | Status: DC

## 2015-02-18 NOTE — Telephone Encounter (Signed)
Pt calling because he was previously on Aldactone 50 mg bid and now on lasix 20mg  qd and pt states he sits down and work and his knee is swelling a lot and he would like to restart the Aldactone. Please advise

## 2015-02-18 NOTE — Telephone Encounter (Signed)
How are bp's running? Refilled Aldactone 50mg  to take one daily. Continue all other bp meds (lasix, labetalol, losartan). rec he schedule f/u appt in 1-2 wks as will need labwork after starting spironolactone.

## 2015-02-19 NOTE — Telephone Encounter (Signed)
Patient said BP's have been ~150's/low 100's. He will start aldactone and follow up scheduled.

## 2015-03-07 ENCOUNTER — Ambulatory Visit: Payer: Worker's Compensation | Admitting: Family Medicine

## 2015-03-07 DIAGNOSIS — Z0289 Encounter for other administrative examinations: Secondary | ICD-10-CM

## 2015-03-27 ENCOUNTER — Encounter (HOSPITAL_COMMUNITY): Payer: Self-pay | Admitting: Emergency Medicine

## 2015-03-27 ENCOUNTER — Emergency Department (HOSPITAL_COMMUNITY)
Admission: EM | Admit: 2015-03-27 | Discharge: 2015-03-27 | Disposition: A | Discharge status: Home / Self Care | Payer: Self-pay | Attending: Emergency Medicine | Admitting: Emergency Medicine

## 2015-03-27 DIAGNOSIS — Y9389 Activity, other specified: Secondary | ICD-10-CM | POA: Insufficient documentation

## 2015-03-27 DIAGNOSIS — W268XXA Contact with other sharp object(s), not elsewhere classified, initial encounter: Secondary | ICD-10-CM | POA: Insufficient documentation

## 2015-03-27 DIAGNOSIS — S61219A Laceration without foreign body of unspecified finger without damage to nail, initial encounter: Secondary | ICD-10-CM

## 2015-03-27 DIAGNOSIS — S61212A Laceration without foreign body of right middle finger without damage to nail, initial encounter: Secondary | ICD-10-CM | POA: Insufficient documentation

## 2015-03-27 DIAGNOSIS — E119 Type 2 diabetes mellitus without complications: Secondary | ICD-10-CM | POA: Insufficient documentation

## 2015-03-27 DIAGNOSIS — I159 Secondary hypertension, unspecified: Secondary | ICD-10-CM | POA: Insufficient documentation

## 2015-03-27 DIAGNOSIS — I5020 Unspecified systolic (congestive) heart failure: Secondary | ICD-10-CM | POA: Insufficient documentation

## 2015-03-27 DIAGNOSIS — Z79899 Other long term (current) drug therapy: Secondary | ICD-10-CM | POA: Insufficient documentation

## 2015-03-27 DIAGNOSIS — Y9289 Other specified places as the place of occurrence of the external cause: Secondary | ICD-10-CM | POA: Insufficient documentation

## 2015-03-27 DIAGNOSIS — Z8669 Personal history of other diseases of the nervous system and sense organs: Secondary | ICD-10-CM | POA: Insufficient documentation

## 2015-03-27 DIAGNOSIS — N183 Chronic kidney disease, stage 3 (moderate): Secondary | ICD-10-CM | POA: Insufficient documentation

## 2015-03-27 DIAGNOSIS — Z87898 Personal history of other specified conditions: Secondary | ICD-10-CM | POA: Insufficient documentation

## 2015-03-27 DIAGNOSIS — Y998 Other external cause status: Secondary | ICD-10-CM | POA: Insufficient documentation

## 2015-03-27 MED ORDER — LIDOCAINE HCL 2 % IJ SOLN
20.0000 mL | Freq: Once | INTRAMUSCULAR | Status: AC
  Administered 2015-03-27: 400 mg via INTRADERMAL
  Filled 2015-03-27: qty 20

## 2015-03-27 MED ORDER — LABETALOL HCL 300 MG PO TABS
300.0000 mg | ORAL_TABLET | Freq: Once | ORAL | Status: AC
  Administered 2015-03-27: 300 mg via ORAL
  Filled 2015-03-27: qty 1

## 2015-03-27 MED ORDER — HYDROCODONE-ACETAMINOPHEN 5-325 MG PO TABS: 1.0000 | ORAL_TABLET | Freq: Four times a day (QID) | ORAL | Status: DC | PRN

## 2015-03-27 MED ORDER — LABETALOL HCL 300 MG PO TABS: 300.0000 mg | ORAL_TABLET | Freq: Two times a day (BID) | ORAL | Status: DC

## 2015-03-27 MED ORDER — LOSARTAN POTASSIUM 100 MG PO TABS: 100.0000 mg | ORAL_TABLET | Freq: Every day | ORAL | Status: DC

## 2015-03-27 MED ORDER — FUROSEMIDE 20 MG PO TABS: 20.0000 mg | ORAL_TABLET | Freq: Every day | ORAL | Status: DC

## 2015-03-27 MED ORDER — LOSARTAN POTASSIUM 50 MG PO TABS
100.0000 mg | ORAL_TABLET | Freq: Every day | ORAL | Status: DC
  Administered 2015-03-27: 100 mg via ORAL
  Filled 2015-03-27: qty 2

## 2015-03-27 NOTE — ED Notes (Signed)
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   Patient denies other problems.  Patient bp is high, but does have a history of HTN.

## 2015-03-27 NOTE — ED Notes (Signed)
See PA assessment 

## 2015-03-27 NOTE — Discharge Instructions (Signed)
Please follow up at Urgent Care in 7 days for sutures removal. Take pain medication as needed.  Return if you notice any signs of infection.  Take your blood pressure medications  Laceration Care, Adult A laceration is a cut that goes through all of the layers of the skin and into the tissue that is right under the skin. Some lacerations heal on their own. Others need to be closed with stitches (sutures), staples, skin adhesive strips, or skin glue. Proper laceration care minimizes the risk of infection and helps the laceration to heal better. HOW TO CARE FOR YOUR LACERATION If sutures or staples were used:  Keep the wound clean and dry.  If you were given a bandage (dressing), you should change it at least one time per day or as told by your health care provider. You should also change it if it becomes wet or dirty.  Keep the wound completely dry for the first 24 hours or as told by your health care provider. After that time, you may shower or bathe. However, make sure that the wound is not soaked in water until after the sutures or staples have been removed.  Clean the wound one time each day or as told by your health care provider:  Wash the wound with soap and water.  Rinse the wound with water to remove all soap.  Pat the wound dry with a clean towel. Do not rub the wound.  After cleaning the wound, apply a thin layer of antibiotic ointmentas told by your health care provider. This will help to prevent infection and keep the dressing from sticking to the wound.  Have the sutures or staples removed as told by your health care provider. If skin adhesive strips were used:  Keep the wound clean and dry.  If you were given a bandage (dressing), you should change it at least one time per day or as told by your health care provider. You should also change it if it becomes dirty or wet.  Do not get the skin adhesive strips wet. You may shower or bathe, but be careful to keep the wound  dry.  If the wound gets wet, pat it dry with a clean towel. Do not rub the wound.  Skin adhesive strips fall off on their own. You may trim the strips as the wound heals. Do not remove skin adhesive strips that are still stuck to the wound. They will fall off in time. If skin glue was used:  Try to keep the wound dry, but you may briefly wet it in the shower or bath. Do not soak the wound in water, such as by swimming.  After you have showered or bathed, gently pat the wound dry with a clean towel. Do not rub the wound.  Do not do any activities that will make you sweat heavily until the skin glue has fallen off on its own.  Do not apply liquid, cream, or ointment medicine to the wound while the skin glue is in place. Using those may loosen the film before the wound has healed.  If you were given a bandage (dressing), you should change it at least one time per day or as told by your health care provider. You should also change it if it becomes dirty or wet.  If a dressing is placed over the wound, be careful not to apply tape directly over the skin glue. Doing that may cause the glue to be pulled off before the wound  has healed.  Do not pick at the glue. The skin glue usually remains in place for 5-10 days, then it falls off of the skin. General Instructions  Take over-the-counter and prescription medicines only as told by your health care provider.  If you were prescribed an antibiotic medicine or ointment, take or apply it as told by your doctor. Do not stop using it even if your condition improves.  To help prevent scarring, make sure to cover your wound with sunscreen whenever you are outside after stitches are removed, after adhesive strips are removed, or when glue remains in place and the wound is healed. Make sure to wear a sunscreen of at least 30 SPF.  Do not scratch or pick at the wound.  Keep all follow-up visits as told by your health care provider. This is  important.  Check your wound every day for signs of infection. Watch for:  Redness, swelling, or pain.  Fluid, blood, or pus.  Raise (elevate) the injured area above the level of your heart while you are sitting or lying down, if possible. SEEK MEDICAL CARE IF:  You received a tetanus shot and you have swelling, severe pain, redness, or bleeding at the injection site.  You have a fever.  A wound that was closed breaks open.  You notice a bad smell coming from your wound or your dressing.  You notice something coming out of the wound, such as wood or glass.  Your pain is not controlled with medicine.  You have increased redness, swelling, or pain at the site of your wound.  You have fluid, blood, or pus coming from your wound.  You notice a change in the color of your skin near your wound.  You need to change the dressing frequently due to fluid, blood, or pus draining from the wound.  You develop a new rash.  You develop numbness around the wound. SEEK IMMEDIATE MEDICAL CARE IF:  You develop severe swelling around the wound.  Your pain suddenly increases and is severe.  You develop painful lumps near the wound or on skin that is anywhere on your body.  You have a red streak going away from your wound.  The wound is on your hand or foot and you cannot properly move a finger or toe.  The wound is on your hand or foot and you notice that your fingers or toes look pale or bluish.   This information is not intended to replace advice given to you by your health care provider. Make sure you discuss any questions you have with your health care provider.   Document Released: 04/13/2005 Document Revised: 08/28/2014 Document Reviewed: 04/09/2014 Elsevier Interactive Patient Education [employer]

## 2015-03-27 NOTE — ED Provider Notes (Signed)
CSN: [id number]     Arrival date & time 03/27/15  1348 History  By signing my name below, I, Stephania Fragmin, attest that this documentation has been prepared under the direction and in the presence of Domenic Moras, PA-C. Electronically Signed: Stephania Fragmin, ED Scribe. 03/27/2015. 2:54 PM.    Chief Complaint  Patient presents with  . finger laceration    The history is provided by the patient. No language interpreter was used.    HPI Comments: Randy [name] is a 44 y.o. male with a history of DM, HTN, and stage 3 CKD, who presents to the Emergency Department complaining of a laceration to his right middle finger after accidentally cutting himself with a razor while opening a box about 45 minutes ago. Pain is sharp, non radiating, mild/moderate in severity without numbness.  He had wrapped his finger in a hand towel and tape PTA but otherwise has not tried any treatments or medications for this. He states he last had a tetanus vaccination 3 years ago. Patient is right-hand-dominant. He reports a history of HTN but has not been able to fill his medications in 1 month due to insurance issues.   Past Medical History  Diagnosis Date  . HTN (hypertension), malignant     previously on BC and goody powders for HA  . Diabetes mellitus 2012    dx hospitalization with A1c 6.6%  . CKD (chronic kidney disease) stage 3, GFR 30-59 ml/min     baseline Cr 1.5-1.7  . Decreased visual acuity     Left eye, resolved - hypertensive retinopathy  . History of Bell's palsy 9/09    history, Left  . Morbid obesity   . Internal derangement of knee 9/09    Left  . History of headache   . Systolic murmur   . Microalbuminuria   . Vitamin D deficiency   . 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 Hilar adenopathy     on CT scan 02/2010, on rpt scan stable/improved.   Past Surgical History  Procedure Laterality Date  . Hospitalization  11/2008    malignant HTN, nl SPEP/UPEP, neg ANCA  panel, nl C3/4, neg anti GBM Ab, neg Hep A/B/C, nl renal US, nl PTH, neg HIV  . US echocardiography  11/2008    LVsys fxn EF 50%, mild MR, normal LV size, neg ANA, neg cryoglobulins  . US echocardiography  2011    severe LVH, EF 40%, LA mildly dilated, PA pressure moderately increased   Family History  Problem Relation Age of Onset  . Hypertension Mother   . Hypertension Father   . Diabetes Father   . Hypertension Brother   . Diabetes Sister   . Coronary artery disease Neg Hx   . Stroke Neg Hx   . Cancer Neg Hx   . Kidney disease Paternal Grandmother     ESRD   Social History  Substance Use Topics  . Smoking status: Never Smoker   . Smokeless tobacco: Never Used     Comment: monthly, when going out  . Alcohol Use: Yes     Comment: Occasional    Review of Systems  Skin: Positive for wound.  Hematological: Does not bruise/bleed easily.   Allergies  Fish-derived products and Iodine  Home Medications   Prior to Admission medications   Medication Sig Start Date End Date Taking? Authorizing Provider  azithromycin (ZITHROMAX) 250 MG tablet Take two tablets on day one followed by one  tablet daily for days 2-5 11/27/14   Eustaquio Boyden, MD  furosemide (LASIX) 20 MG tablet TAKE 1 TABLET BY MOUTH ONCE A DAY 11/27/14   Eustaquio Boyden, MD  glimepiride (AMARYL) 2 MG tablet Take one tablet daily before breakfast 11/27/14   Eustaquio Boyden, MD  labetalol (NORMODYNE) 300 MG tablet TAKE 1 TABLET BY MOUTH 2 TIMES A DAY 11/27/14   Eustaquio Boyden, MD  losartan (COZAAR) 100 MG tablet TAKE 1 TABLET (100 MG TOTAL) BY MOUTH DAILY. 11/27/14   Eustaquio Boyden, MD  spironolactone (ALDACTONE) 50 MG tablet Take 1 tablet (50 mg total) by mouth daily. 02/18/15   Eustaquio Boyden, MD   There were no vitals taken for this visit. Physical Exam  Constitutional: He is oriented to person, place, and time. He appears well-developed and well-nourished. No distress.  HENT:  Head: Normocephalic and atraumatic.   Eyes: Conjunctivae and EOM are normal.  Neck: Neck supple. No tracheal deviation present.  Cardiovascular: Normal rate.   Pulmonary/Chest: Effort normal. No respiratory distress.  Musculoskeletal: Normal range of motion.  Right middle finger: 3 cm laceration noted to the medial proximal phalanx. Actively bleeding. No foreign object noted. No gross deformity. Normal ROM to all major joints.   Neurological: He is alert and oriented to person, place, and time.  Skin: Skin is warm and dry.  Psychiatric: He has a normal mood and affect. His behavior is normal.  Nursing note and vitals reviewed.   ED Course  Procedures (including critical care time)  DIAGNOSTIC STUDIES: Oxygen Saturation is 100% on RA, normal by my interpretation.    COORDINATION OF CARE: 2:11 PM - Discussed treatment plan with pt at bedside which includes laceration repair. Pt verbalized understanding and agreed to plan.   LACERATION REPAIR PROCEDURE NOTE The patient's identification was confirmed and consent was obtained. This procedure was performed by Fayrene Helper, PA-C, at 2:24 PM. Site: Right middle finger Sterile procedures observed Anesthetic used (type and amt): Lidocaine 2% w/o epi, 6 mL Suture type/size: 5-0 prolene Length: 3 cm # of Sutures: 6 Technique: Simple interrupted Antibx ointment applied Tetanus UTD Site anesthetized, irrigated with NS, explored without evidence of foreign body, wound well approximated, site covered with dry, sterile dressing.  Patient tolerated procedure well without complications. Instructions for care discussed verbally and patient provided with additional written instructions for homecare and f/u.   MDM   Final diagnoses:  None   Pressure irrigation performed. Laceration occurred < 8 hours prior to repair which was well tolerated. Pt has no co morbidities to effect normal wound healing. Discussed suture home care w pt and answered questions. Will discharge home with pain  medications. Strict return precautions given and pt instructed to return to the ED immediately for any signs of infection. Pt to f-u for wound check and suture removal in 7 days. Pt is hypertensive with BP 206/130 upon arrival.  Hx of malignant HTN not on his BP. Will give his BP medication in ER and will monitor. Will also discharge home with Rx losartan and labetalol, as pt cannot fill medications due to insurance issues.  Pt verbalized understanding and agreed to plan.   3:42 PM BP improves to 174/113 after pt received BP medication.  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    BP 174/113 mmHg  Pulse 76  Temp(Src) 98.3 F (36.8 C) (Oral)  Resp 18  SpO2 97%   I personally performed the services described in this documentation, which was scribed in my presence. The recorded information has been reviewed and is accurate.       Domenic Moras, PA-C 03/27/15 1543  Virgel Manifold, MD 03/30/15 2100

## 2015-04-05 ENCOUNTER — Emergency Department (INDEPENDENT_AMBULATORY_CARE_PROVIDER_SITE_OTHER)
Admission: EM | Admit: 2015-04-05 | Discharge: 2015-04-05 | Disposition: A | Discharge status: Home / Self Care | Payer: Self-pay | Source: Home / Self Care

## 2015-04-05 ENCOUNTER — Encounter (HOSPITAL_COMMUNITY): Payer: Self-pay | Admitting: Emergency Medicine

## 2015-04-05 DIAGNOSIS — Z4802 Encounter for removal of sutures: Secondary | ICD-10-CM

## 2015-04-05 MED ORDER — TETANUS-DIPHTH-ACELL PERTUSSIS 5-2.5-18.5 LF-MCG/0.5 IM SUSP
INTRAMUSCULAR | Status: AC
  Filled 2015-04-05: qty 0.5

## 2015-04-05 MED ORDER — TETANUS-DIPHTH-ACELL PERTUSSIS 5-2.5-18.5 LF-MCG/0.5 IM SUSP
0.5000 mL | Freq: Once | INTRAMUSCULAR | Status: AC
  Administered 2015-04-05: 0.5 mL via INTRAMUSCULAR

## 2015-04-05 NOTE — Discharge Instructions (Signed)

## 2015-04-05 NOTE — ED Provider Notes (Addendum)
CSN: [id number]     Arrival date & time 04/05/15  1654 History   None    Chief Complaint  Patient presents with  . Suture / Staple Removal   (Consider location/radiation/quality/duration/timing/severity/associated sxs/prior Treatment) Patient is a 44 y.o. male presenting with suture removal. The history is provided by the patient.  Suture / Staple Removal This is a new problem. The current episode started more than 1 week ago. The problem has been gradually improving. Associated symptoms comments: Distal numbness of finger..    Past Medical History  Diagnosis Date  . HTN (hypertension), malignant     previously on BC and goody powders for HA  . Diabetes mellitus 2012    dx hospitalization with A1c 6.6%  . CKD (chronic kidney disease) stage 3, GFR 30-59 ml/min     baseline Cr 1.5-1.7  . Decreased visual acuity     Left eye, resolved - hypertensive retinopathy  . History of Bell's palsy 9/09    history, Left  . Morbid obesity ([place])   . Internal derangement of knee 9/09    Left  . History of headache   . Systolic murmur   . Microalbuminuria   . Vitamin D deficiency   . 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[place])     echo 2011 with nonischemic hypertensive cardiomyopathy  . Hilar adenopathy     on CT scan 02/2010, on rpt scan stable/improved.   Past Surgical History  Procedure Laterality Date  . Hospitalization  11/2008    malignant HTN, nl SPEP/UPEP, neg ANCA panel, nl C3/4, neg anti GBM Ab, neg Hep A/B/C, nl renal US, nl PTH, neg HIV  . US echocardiography  11/2008    LVsys fxn EF 50%, mild MR, normal LV size, neg ANA, neg cryoglobulins  . US echocardiography  2011    severe LVH, EF 40%, LA mildly dilated, PA pressure moderately increased   Family History  Problem Relation Age of Onset  . Hypertension Mother   . Hypertension Father   . Diabetes Father   . Hypertension Brother   . Diabetes Sister   . Coronary artery disease Neg Hx   . Stroke Neg Hx   . Cancer Neg Hx   . Kidney disease  Paternal Grandmother     ESRD   Social History  Substance Use Topics  . Smoking status: Never Smoker   . Smokeless tobacco: Never Used     Comment: monthly, when going out  . Alcohol Use: Yes     Comment: Occasional    Review of Systems  Constitutional: Negative.   Musculoskeletal: Negative.   All other systems reviewed and are negative.   Allergies  Fish-derived products and Iodine  Home Medications   Prior to Admission medications   Medication Sig Start Date End Date Taking? Authorizing Provider  furosemide (LASIX) 20 MG tablet Take 1 tablet (20 mg total) by mouth daily. 03/27/15   Domenic Moras, PA-C  HYDROcodone-acetaminophen (NORCO/VICODIN) 5-325 MG tablet Take 1 tablet by mouth every 6 (six) hours as needed for moderate pain. 03/27/15   Domenic Moras, PA-C  labetalol (NORMODYNE) 300 MG tablet Take 1 tablet (300 mg total) by mouth 2 (two) times daily. 03/27/15   Domenic Moras, PA-C  losartan (COZAAR) 100 MG tablet Take 1 tablet (100 mg total) by mouth daily. 03/27/15   Domenic Moras, PA-C   Meds Ordered and Administered this Visit   Medications  Tdap (BOOSTRIX) injection 0.5 mL (not administered)    BP 175/102 mmHg  Pulse 68  Temp(Src) 98.7 F (37.1 C) (Oral)  Resp 16  SpO2 96% No data found.   Physical Exam  Constitutional: He is oriented to person, place, and time. He appears well-developed and well-nourished.  Musculoskeletal: He exhibits no tenderness.  Distal numbness to finger lac site of rmf.  Neurological: He is alert and oriented to person, place, and time.  Skin: Skin is warm and dry. No erythema.  Nursing note and vitals reviewed.   ED Course  Procedures (including critical care time)  Labs Review Labs Reviewed - No data to display  Imaging Review No results found.   Visual Acuity Review  Right Eye Distance:   Left Eye Distance:   Bilateral Distance:    Right Eye Near:   Left Eye Near:    Bilateral Near:         MDM   1. Encounter  for removal of sutures    Suture removal distal paresthesia. Referred to dr Fredna Dow for eval.    Billy Fischer, MD 04/05/15 1801  Billy Fischer, MD 04/05/15 [phone]

## 2015-04-05 NOTE — ED Notes (Addendum)
Suture removal for right middle finger.  Sutures placed 11/30

## 2015-07-29 ENCOUNTER — Encounter (HOSPITAL_COMMUNITY): Payer: Self-pay | Admitting: Nurse Practitioner

## 2015-07-29 ENCOUNTER — Emergency Department (HOSPITAL_COMMUNITY)
Admission: EM | Admit: 2015-07-29 | Discharge: 2015-07-29 | Disposition: A | Discharge status: Home / Self Care | Payer: Self-pay | Attending: Emergency Medicine | Admitting: Emergency Medicine

## 2015-07-29 DIAGNOSIS — R011 Cardiac murmur, unspecified: Secondary | ICD-10-CM | POA: Insufficient documentation

## 2015-07-29 DIAGNOSIS — I129 Hypertensive chronic kidney disease with stage 1 through stage 4 chronic kidney disease, or unspecified chronic kidney disease: Secondary | ICD-10-CM | POA: Insufficient documentation

## 2015-07-29 DIAGNOSIS — R21 Rash and other nonspecific skin eruption: Secondary | ICD-10-CM | POA: Insufficient documentation

## 2015-07-29 DIAGNOSIS — Z8669 Personal history of other diseases of the nervous system and sense organs: Secondary | ICD-10-CM | POA: Insufficient documentation

## 2015-07-29 DIAGNOSIS — I5020 Unspecified systolic (congestive) heart failure: Secondary | ICD-10-CM | POA: Insufficient documentation

## 2015-07-29 DIAGNOSIS — E119 Type 2 diabetes mellitus without complications: Secondary | ICD-10-CM | POA: Insufficient documentation

## 2015-07-29 DIAGNOSIS — N183 Chronic kidney disease, stage 3 (moderate): Secondary | ICD-10-CM | POA: Insufficient documentation

## 2015-07-29 DIAGNOSIS — Z79899 Other long term (current) drug therapy: Secondary | ICD-10-CM | POA: Insufficient documentation

## 2015-07-29 MED ORDER — HYDROCORTISONE 1 % EX CREA
TOPICAL_CREAM | Freq: Two times a day (BID) | CUTANEOUS | Status: DC
  Administered 2015-07-29: 13:00:00 via TOPICAL
  Filled 2015-07-29: qty 28

## 2015-07-29 MED ORDER — HYDROXYZINE HCL 25 MG PO TABS: 25.0000 mg | ORAL_TABLET | Freq: Four times a day (QID) | ORAL | Status: DC

## 2015-07-29 MED ORDER — PREDNISONE 10 MG (21) PO TBPK: 10.0000 mg | ORAL_TABLET | Freq: Every day | ORAL | Status: DC

## 2015-07-29 MED ORDER — PREDNISONE 20 MG PO TABS
60.0000 mg | ORAL_TABLET | Freq: Once | ORAL | Status: AC
Start: 2015-07-29 — End: 2015-07-29
  Administered 2015-07-29: 60 mg via ORAL
  Filled 2015-07-29: qty 3

## 2015-07-29 NOTE — ED Notes (Signed)
Declined W/C at D/C and was escorted to lobby by RN. 

## 2015-07-29 NOTE — Discharge Instructions (Signed)
Allergies °An allergy is when your body reacts to a substance in a way that is not normal. An allergic reaction can happen after you: °· Eat something. °· Breathe in something. °· Touch something. °WHAT KINDS OF ALLERGIES ARE THERE? °You can be allergic to: °· Things that are only around during certain seasons, like molds and pollens. °· Foods. °· Drugs. °· Insects. °· Animal dander. °WHAT ARE SYMPTOMS OF ALLERGIES? °· Puffiness (swelling). This may happen on the lips, face, tongue, mouth, or throat. °· Sneezing. °· Coughing. °· Breathing loudly (wheezing). °· Stuffy nose. °· Tingling in the mouth. °· A rash. °· Itching. °· Itchy, red, puffy areas of skin (hives). °· Watery eyes. °· Throwing up (vomiting). °· Watery poop (diarrhea). °· Dizziness. °· Feeling faint or fainting. °· Trouble breathing or swallowing. °· A tight feeling in the chest. °· A fast heartbeat. °HOW ARE ALLERGIES DIAGNOSED? °Allergies can be diagnosed with: °· A medical and family history. °· Skin tests. °· Blood tests. °· A food diary. A food diary is a record of all the foods, drinks, and symptoms you have each day. °· The results of an elimination diet. This diet involves making sure not to eat certain foods and then seeing what happens when you start eating them again. °HOW ARE ALLERGIES TREATED? °There is no cure for allergies, but allergic reactions can be treated with medicine. Severe reactions usually need to be treated at a hospital.  °HOW CAN REACTIONS BE PREVENTED? °The best way to prevent an allergic reaction is to avoid the thing you are allergic to. Allergy shots and medicines can also help prevent reactions in some cases. °  °This information is not intended to replace advice given to you by your health care provider. Make sure you discuss any questions you have with your health care provider. °  °Document Released: 08/08/2012 Document Revised: 05/04/2014 Document Reviewed: 01/23/2014 °Elsevier Interactive Patient Education ©2016  [employer] ° °

## 2015-07-29 NOTE — ED Notes (Addendum)
He c/o 3 day history itchy red rash to BUE, back, knees. He has taken benadryl and anti-itch lotion with no relief. Denies any new products or medications. His bp is elevated, he states his BP is normally high despite taking his BP medications as instructed, he states he "feels fine."

## 2015-07-29 NOTE — ED Provider Notes (Signed)
CSN: [id number]     Arrival date & time 07/29/15  1121 History  By signing my name below, I, Eustaquio Maize, attest that this documentation has been prepared under the direction and in the presence of [employer], PA-C. Electronically Signed: Eustaquio Maize, ED Scribe. 07/29/2015. 12:54 PM.   Chief Complaint  Patient presents with  . Rash   The history is provided by the patient. No language interpreter was used.     HPI Comments: Randy [name] is a 45 y.o. male with PMHx HTN who presents to the Emergency Department complaining of gradual onset, constant, itching rash to BUEs, bilateral knees, face, and back x a couple of days. He reports that the rash started from a patch on his right forearm and has since spread. Pt does admit to itching the rash. No new lotions, soaps, detergents, or medications. No exposure to anything outside. He has been taking Benadryl and applying Rexall Anti Itch cream without relief. Denies itching sensation to palms of hands or soles of feet or any other associated symptoms. No hx STDs. Pt is UTD on immunizations.    Past Medical History  Diagnosis Date  . HTN (hypertension), malignant     previously on BC and goody powders for HA  . Diabetes mellitus 2012    dx hospitalization with A1c 6.6%  . CKD (chronic kidney disease) stage 3, GFR 30-59 ml/min     baseline Cr 1.5-1.7  . Decreased visual acuity     Left eye, resolved - hypertensive retinopathy  . History of Bell's palsy 9/09    history, Left  . Morbid obesity ([place])   . Internal derangement of knee 9/09    Left  . History of headache   . Systolic murmur   . Microalbuminuria   . Vitamin D deficiency   . 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 Hilar adenopathy     on CT scan 02/2010, on rpt scan stable/improved.   Past Surgical History  Procedure Laterality Date  . Hospitalization  11/2008    malignant HTN, nl SPEP/UPEP, neg ANCA panel, nl C3/4, neg  anti GBM Ab, neg Hep A/B/C, nl renal US, nl PTH, neg HIV  . US echocardiography  11/2008    LVsys fxn EF 50%, mild MR, normal LV size, neg ANA, neg cryoglobulins  . US echocardiography  2011    severe LVH, EF 40%, LA mildly dilated, PA pressure moderately increased   Family History  Problem Relation Age of Onset  . Hypertension Mother   . Hypertension Father   . Diabetes Father   . Hypertension Brother   . Diabetes Sister   . Coronary artery disease Neg Hx   . Stroke Neg Hx   . Cancer Neg Hx   . Kidney disease Paternal Grandmother     ESRD   Social History  Substance Use Topics  . Smoking status: Never Smoker   . Smokeless tobacco: Never Used     Comment: monthly, when going out  . Alcohol Use: Yes     Comment: Occasional    Review of Systems  Constitutional: Negative for fever.  Skin: Positive for rash.   Allergies  Fish-derived products and Iodine  Home Medications   Prior to Admission medications   Medication Sig Start Date End Date Taking? Authorizing Provider  furosemide (LASIX) 20 MG tablet Take 1 tablet (20 mg total) by mouth daily. 03/27/15   Domenic Moras, PA-C  HYDROcodone-acetaminophen (NORCO/VICODIN)  5-325 MG tablet Take 1 tablet by mouth every 6 (six) hours as needed for moderate pain. 03/27/15   Domenic Moras, PA-C  hydrOXYzine (ATARAX/VISTARIL) 25 MG tablet Take 1 tablet (25 mg total) by mouth every 6 (six) hours. 07/29/15   Nakai Pollio Patel-Mills, PA-C  labetalol (NORMODYNE) 300 MG tablet Take 1 tablet (300 mg total) by mouth 2 (two) times daily. 03/27/15   Domenic Moras, PA-C  losartan (COZAAR) 100 MG tablet Take 1 tablet (100 mg total) by mouth daily. 03/27/15   Domenic Moras, PA-C  predniSONE (STERAPRED UNI-PAK 21 TAB) 10 MG (21) TBPK tablet Take 1 tablet (10 mg total) by mouth daily. Take 6 tabs by mouth daily  for 2 days, then 5 tabs for 2 days, then 4 tabs for 2 days, then 3 tabs for 2 days, 2 tabs for 2 days, then 1 tab by mouth daily for 2 days 07/29/15   Orvil Feil  Patel-Mills, PA-C   BP 179/118 mmHg  Pulse 79  Temp(Src) 98.8 F (37.1 C) (Oral)  Resp 17  SpO2 95%   Physical Exam  Constitutional: He is oriented to person, place, and time. He appears well-developed and well-nourished. No distress.  HENT:  Head: Normocephalic and atraumatic.  Eyes: Conjunctivae and EOM are normal.  Neck: Neck supple. No tracheal deviation present.  Cardiovascular: Normal rate.   Pulmonary/Chest: Effort normal. No respiratory distress.  Musculoskeletal: Normal range of motion.  Neurological: He is alert and oriented to person, place, and time.  Skin: Skin is warm and dry. Rash noted.  Diffuse macular raised rash on his BUEs, maxilla, back, and knees.  No crusting or drainage. No Herald Patch or Christmas Tree pattern.   Psychiatric: He has a normal mood and affect. His behavior is normal.  Nursing note and vitals reviewed.   ED Course  Procedures (including critical care time)  DIAGNOSTIC STUDIES: Oxygen Saturation is 95% on RA, adequate by my interpretation.    COORDINATION OF CARE: 12:50 PM-Discussed treatment plan which includes Prednisone and Hydrocortisone cream with pt at bedside and pt agreed to plan.   Labs Review Labs Reviewed - No data to display  Imaging Review No results found.   EKG Interpretation None      MDM   Final diagnoses:  Rash  Patient with nonspecific eruption. No signs of infection. Discharge with symptomatic treatment. Follow up with PCP in 2-3 days.   I personally performed the services described in this documentation, which was scribed in my presence. The recorded information has been reviewed and is accurate.      Ottie Glazier, PA-C 07/29/15 [place], MD 07/30/15 [phone]

## 2015-09-25 ENCOUNTER — Emergency Department
Admission: EM | Admit: 2015-09-25 | Discharge: 2015-09-25 | Disposition: A | Discharge status: Home / Self Care | Payer: Self-pay | Attending: Emergency Medicine | Admitting: Emergency Medicine

## 2015-09-25 ENCOUNTER — Emergency Department: Payer: Self-pay

## 2015-09-25 DIAGNOSIS — E1122 Type 2 diabetes mellitus with diabetic chronic kidney disease: Secondary | ICD-10-CM | POA: Insufficient documentation

## 2015-09-25 DIAGNOSIS — R079 Chest pain, unspecified: Secondary | ICD-10-CM | POA: Insufficient documentation

## 2015-09-25 DIAGNOSIS — I130 Hypertensive heart and chronic kidney disease with heart failure and stage 1 through stage 4 chronic kidney disease, or unspecified chronic kidney disease: Secondary | ICD-10-CM | POA: Insufficient documentation

## 2015-09-25 DIAGNOSIS — Z79899 Other long term (current) drug therapy: Secondary | ICD-10-CM | POA: Insufficient documentation

## 2015-09-25 DIAGNOSIS — Z7952 Long term (current) use of systemic steroids: Secondary | ICD-10-CM | POA: Insufficient documentation

## 2015-09-25 DIAGNOSIS — I5020 Unspecified systolic (congestive) heart failure: Secondary | ICD-10-CM | POA: Insufficient documentation

## 2015-09-25 DIAGNOSIS — N183 Chronic kidney disease, stage 3 (moderate): Secondary | ICD-10-CM | POA: Insufficient documentation

## 2015-09-25 LAB — BASIC METABOLIC PANEL
ANION GAP: 10 (ref 5–15)
BUN: 23 mg/dL — ABNORMAL HIGH (ref 6–20)
CALCIUM: 9.1 mg/dL (ref 8.9–10.3)
CO2: 23 mmol/L (ref 22–32)
Chloride: 99 mmol/L — ABNORMAL LOW (ref 101–111)
Creatinine, Ser: 1.55 mg/dL — ABNORMAL HIGH (ref 0.61–1.24)
GFR, EST NON AFRICAN AMERICAN: 54 mL/min — AB (ref >60)
Glucose, Bld: 289 mg/dL — ABNORMAL HIGH (ref 65–99)
POTASSIUM: 4.1 mmol/L (ref 3.5–5.1)
SODIUM: 132 mmol/L — AB (ref 135–145)

## 2015-09-25 LAB — CBC
HEMATOCRIT: 47.9 % (ref 40.0–52.0)
Hemoglobin: 15.3 g/dL (ref 13.0–18.0)
MCH: 25.3 pg — ABNORMAL LOW (ref 26.0–34.0)
MCHC: 31.8 g/dL — ABNORMAL LOW (ref 32.0–36.0)
MCV: 79.5 fL — AB (ref 80.0–100.0)
PLATELETS: 228 10*3/uL (ref 150–440)
RBC: 6.03 MIL/uL — AB (ref 4.40–5.90)
RDW: 15.3 % — AB (ref 11.5–14.5)
WBC: 8.5 10*3/uL (ref 3.8–10.6)

## 2015-09-25 LAB — TROPONIN I
TROPONIN I: 0.06 ng/mL — AB (ref <0.031)
TROPONIN I: 0.04 ng/mL — AB (ref <0.031)

## 2015-09-25 LAB — BRAIN NATRIURETIC PEPTIDE: B NATRIURETIC PEPTIDE 5: 31 pg/mL (ref 0.0–100.0)

## 2015-09-25 MED ORDER — ALUMINUM-MAGNESIUM-SIMETHICONE 200-200-20 MG/5ML PO SUSP: 30.0000 mL | Freq: Three times a day (TID) | ORAL | Status: DC

## 2015-09-25 MED ORDER — FAMOTIDINE 20 MG PO TABS
40.0000 mg | ORAL_TABLET | Freq: Once | ORAL | Status: AC
  Administered 2015-09-25: 40 mg via ORAL
  Filled 2015-09-25: qty 2

## 2015-09-25 MED ORDER — GI COCKTAIL ~~LOC~~
30.0000 mL | ORAL | Status: AC
  Administered 2015-09-25: 30 mL via ORAL
  Filled 2015-09-25: qty 30

## 2015-09-25 MED ORDER — FAMOTIDINE 20 MG PO TABS: 20.0000 mg | ORAL_TABLET | Freq: Two times a day (BID) | ORAL | Status: DC

## 2015-09-25 MED ORDER — PANTOPRAZOLE SODIUM 40 MG IV SOLR
40.0000 mg | Freq: Once | INTRAVENOUS | Status: AC
  Administered 2015-09-25: 40 mg via INTRAVENOUS
  Filled 2015-09-25: qty 40

## 2015-09-25 NOTE — ED Provider Notes (Signed)
[facility] Emergency Department Provider Note  ____________________________________________  Time seen: 9:40 PM  I have reviewed the triage vital signs and the nursing notes.   HISTORY  Chief Complaint Chest Pain    HPI Randy [name] is a 45 y.o. male who complains of chest pain that started yesterday while he was seated at his computer at work. The pain lasted for 8-10 hours and then went away in the evening. He slept uneventfully all night. Today around noon the pain reoccurred. He has been constant for approximately 8 hours until arriving to the emergency department, and now seems to have resolved. He's been eating a lot of fast food and steaks and hamburgers over the past week. Denies any exertional symptoms. No associated shortness of breath radiation vomiting or diaphoresis. Not pleuritic. No identifiable aggravating or alleviating factors. Pain is described as aching.  Denies hypertension diabetes hyperlipidemia. No smoking.     Past Medical History  Diagnosis Date  . HTN (hypertension), malignant     previously on BC and goody powders for HA  . Diabetes mellitus 2012    dx hospitalization with A1c 6.6%  . CKD (chronic kidney disease) stage 3, GFR 30-59 ml/min     baseline Cr 1.5-1.7  . Decreased visual acuity     Left eye, resolved - hypertensive retinopathy  . History of Bell's palsy 9/09    history, Left  . Morbid obesity ([place])   . Internal derangement of knee 9/09    Left  . History of headache   . Systolic murmur   . Microalbuminuria   . Vitamin D deficiency   . 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 Hilar adenopathy     on CT scan 02/2010, on rpt scan stable/improved.     Patient Active Problem List   Diagnosis Date Noted  . Acute upper respiratory infection 11/27/2014  . Numbness of finger 09/19/2011  . Hilar adenopathy   . 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[place]) 87/56/4332  . 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 Renal insufficiency 03/21/2008  . VISUAL ACUITY, DECREASED, LEFT EYE 03/19/2008  . BELL'S PALSY, LEFT 01/30/2008  . Morbid obesity ([place]) 01/20/2008  . Malignant hypertension with heart failure and stage 3 chronic kidney disease ([place]) 01/20/2008     Past Surgical History  Procedure Laterality Date  . Hospitalization  11/2008    malignant HTN, nl SPEP/UPEP, neg ANCA panel, nl C3/4, neg anti GBM Ab, neg Hep A/B/C, nl renal US, nl PTH, neg HIV  . US echocardiography  11/2008    LVsys fxn EF 50%, mild MR, normal LV size, neg ANA, neg cryoglobulins  . US echocardiography  2011    severe LVH, EF 40%, LA mildly dilated, PA pressure moderately increased     Current Outpatient Rx  Name  Route  Sig  Dispense  Refill  . aluminum-magnesium hydroxide-simethicone (MAALOX) [phone] MG/5ML SUSP   Oral   Take 30 mLs by mouth 4 (four) times daily -  before meals and at bedtime.   355 mL   0   . famotidine (PEPCID) 20 MG tablet   Oral   Take 1 tablet (20 mg total) by mouth 2 (two) times daily.   60 tablet   0   . furosemide (LASIX) 20 MG tablet   Oral   Take 1 tablet (20 mg total) by mouth daily.   30 tablet   0   .  HYDROcodone-acetaminophen (NORCO/VICODIN) 5-325 MG tablet   Oral   Take 1 tablet by mouth every 6 (six) hours as needed for moderate pain.   10 tablet   0   . hydrOXYzine (ATARAX/VISTARIL) 25 MG tablet   Oral   Take 1 tablet (25 mg total) by mouth every 6 (six) hours.   12 tablet   0   . labetalol (NORMODYNE) 300 MG tablet   Oral   Take 1 tablet (300 mg total) by mouth 2 (two) times daily.   30 tablet   0   . losartan (COZAAR) 100 MG tablet   Oral   Take 1 tablet (100 mg total) by mouth daily.   30 tablet   0   . predniSONE (STERAPRED UNI-PAK 21 TAB) 10 MG (21) TBPK tablet   Oral   Take 1 tablet (10 mg total) by mouth daily. Take 6 tabs by mouth daily  for 2 days, then 5 tabs for 2 days, then 4 tabs  for 2 days, then 3 tabs for 2 days, 2 tabs for 2 days, then 1 tab by mouth daily for 2 days   42 tablet   0      Allergies Fish-derived products and Iodine   Family History  Problem Relation Age of Onset  . Hypertension Mother   . Hypertension Father   . Diabetes Father   . Hypertension Brother   . Diabetes Sister   . Coronary artery disease Neg Hx   . Stroke Neg Hx   . Cancer Neg Hx   . Kidney disease Paternal Grandmother     ESRD    Social History Social History  Substance Use Topics  . Smoking status: Never Smoker   . Smokeless tobacco: Never Used     Comment: monthly, when going out  . Alcohol Use: Yes     Comment: Occasional    Review of Systems  Constitutional:   No fever or chills.  Eyes:   No vision changes.  ENT:   No sore throat. No rhinorrhea. Cardiovascular:   Positive as above. Respiratory:   No dyspnea or cough. Gastrointestinal:   Negative for abdominal pain, vomiting and diarrhea.  Genitourinary:   Negative for dysuria or difficulty urinating. Musculoskeletal:   Negative for focal pain or swelling Neurological:   Negative for headaches 10-point ROS otherwise negative.  ____________________________________________   PHYSICAL EXAM:  VITAL SIGNS: ED Triage Vitals  Enc Vitals Group     BP 09/25/15 1903 182/103 mmHg     Pulse Rate 09/25/15 1903 94     Resp 09/25/15 1903 20     Temp 09/25/15 1903 99.9 F (37.7 C)     Temp Source 09/25/15 1903 Oral     SpO2 09/25/15 1903 97 %     Weight 09/25/15 1903 360 lb (163.295 kg)     Height 09/25/15 1903 6\' 7"  (2.007 m)     Head Cir --      Peak Flow --      Pain Score 09/25/15 1904 8     Pain Loc --      Pain Edu? --      Excl. in GC? --     Vital signs reviewed, nursing assessments reviewed.   Constitutional:   Alert and oriented. Well appearing and in no distress. Eyes:   No scleral icterus. No conjunctival pallor. PERRL. EOMI.  No nystagmus. ENT   Head:   Normocephalic and  atraumatic.   Nose:   No congestion/rhinnorhea.  No septal hematoma   Mouth/Throat:   MMM, no pharyngeal erythema. No peritonsillar mass.    Neck:   No stridor. No SubQ emphysema. No meningismus. Hematological/Lymphatic/Immunilogical:   No cervical lymphadenopathy. Cardiovascular:   RRR. Symmetric bilateral radial and DP pulses.  No murmurs.  Respiratory:   Normal respiratory effort without tachypnea nor retractions. Breath sounds are clear and equal bilaterally. No wheezes/rales/rhonchi. Gastrointestinal:   Soft With left upper quadrant tenderness which reproduces the chest pain.. Non distended. There is no CVA tenderness.  No rebound, rigidity, or guarding. Genitourinary:   deferred Musculoskeletal:   Nontender with normal range of motion in all extremities. No joint effusions.  No lower extremity tenderness.  No edema. Positive left lateral chest wall tenderness which reproduces the chest pain. Neurologic:   Normal speech and language.  CN 2-10 normal. Motor grossly intact. No gross focal neurologic deficits are appreciated.  Skin:    Skin is warm, dry and intact. No rash noted.  No petechiae, purpura, or bullae.  ____________________________________________    LABS (pertinent positives/negatives) (all labs ordered are listed, but only abnormal results are displayed) Labs Reviewed  CBC - Abnormal; Notable for the following:    RBC 6.03 (*)    MCV 79.5 (*)    MCH 25.3 (*)    MCHC 31.8 (*)    RDW 15.3 (*)    All other components within normal limits  BASIC METABOLIC PANEL - Abnormal; Notable for the following:    Sodium 132 (*)    Chloride 99 (*)    Glucose, Bld 289 (*)    BUN 23 (*)    Creatinine, Ser 1.55 (*)    GFR calc non Af Amer 54 (*)    All other components within normal limits  TROPONIN I - Abnormal; Notable for the following:    Troponin I 0.06 (*)    All other components within normal limits  TROPONIN I - Abnormal; Notable for the following:    Troponin I  0.04 (*)    All other components within normal limits  BRAIN NATRIURETIC PEPTIDE   ____________________________________________   EKG  Interpreted by me Normal sinus rhythm rate of 93, normal axis and intervals. Normal QRS ST segments and T waves. Voltage criteria for LVH in the high lateral leads.  ____________________________________________    RADIOLOGY  This x-ray reveals Mild cardiomegaly, mild vascular congestion  ____________________________________________   PROCEDURES   ____________________________________________   INITIAL IMPRESSION / ASSESSMENT AND PLAN / ED COURSE  Pertinent labs & imaging results that were available during my care of the patient were reviewed by me and considered in my medical decision making (see chart for details).  Patient presents with atypical chest pain.Considering the patient's symptoms, medical history, and physical examination today, I have low suspicion for ACS, PE, TAD, pneumothorax, carditis, mediastinitis, pneumonia, CHF, or sepsis. Suspect the troponin his chronic baseline. This is repeated after interval of several hours, and actually the repeat troponin was decreased. Presentation is not consistent with ACS and in fact is much more consistent with chest wall pain versus gastritis. Patient given antacids in the emergency department is feeling better remains chest pain-free. We'll discharge home, follow up with primary care.       ____________________________________________   FINAL CLINICAL IMPRESSION(S) / ED DIAGNOSES  Final diagnoses:  Nonspecific chest pain       Portions of this note were generated with dragon dictation software. Dictation errors may occur despite best attempts at proofreading.   Carrie Mew,  MD 09/25/15 2318

## 2015-09-25 NOTE — ED Notes (Signed)
Pt in with co chest pain x 2 days states to left chest no hx of the same. States does have some shob none noted.

## 2015-09-25 NOTE — Discharge Instructions (Signed)
Nonspecific Chest Pain  °Chest pain can be caused by many different conditions. There is always a chance that your pain could be related to something serious, such as a heart attack or a blood clot in your lungs. Chest pain can also be caused by conditions that are not life-threatening. If you have chest pain, it is very important to follow up with your health care provider. °CAUSES  °Chest pain can be caused by: °· Heartburn. °· Pneumonia or bronchitis. °· Anxiety or stress. °· Inflammation around your heart (pericarditis) or lung (pleuritis or pleurisy). °· A blood clot in your lung. °· A collapsed lung (pneumothorax). It can develop suddenly on its own (spontaneous pneumothorax) or from trauma to the chest. °· Shingles infection (varicella-zoster virus). °· Heart attack. °· Damage to the bones, muscles, and cartilage that make up your chest wall. This can include: °¨ Bruised bones due to injury. °¨ Strained muscles or cartilage due to frequent or repeated coughing or overwork. °¨ Fracture to one or more ribs. °¨ Sore cartilage due to inflammation (costochondritis). °RISK FACTORS  °Risk factors for chest pain may include: °· Activities that increase your risk for trauma or injury to your chest. °· Respiratory infections or conditions that cause frequent coughing. °· Medical conditions or overeating that can cause heartburn. °· Heart disease or family history of heart disease. °· Conditions or health behaviors that increase your risk of developing a blood clot. °· Having had chicken pox (varicella zoster). °SIGNS AND SYMPTOMS °Chest pain can feel like: °· Burning or tingling on the surface of your chest or deep in your chest. °· Crushing, pressure, aching, or squeezing pain. °· Dull or sharp pain that is worse when you move, cough, or take a deep breath. °· Pain that is also felt in your back, neck, shoulder, or arm, or pain that spreads to any of these areas. °Your chest pain may come and go, or it may stay  constant. °DIAGNOSIS °Lab tests or other studies may be needed to find the cause of your pain. Your health care provider may have you take a test called an ambulatory ECG (electrocardiogram). An ECG records your heartbeat patterns at the time the test is performed. You may also have other tests, such as: °· Transthoracic echocardiogram (TTE). During echocardiography, sound waves are used to create a picture of all of the heart structures and to look at how blood flows through your heart. °· Transesophageal echocardiogram (TEE). This is a more advanced imaging test that obtains images from inside your body. It allows your health care provider to see your heart in finer detail. °· Cardiac monitoring. This allows your health care provider to monitor your heart rate and rhythm in real time. °· Holter monitor. This is a portable device that records your heartbeat and can help to diagnose abnormal heartbeats. It allows your health care provider to track your heart activity for several days, if needed. °· Stress tests. These can be done through exercise or by taking medicine that makes your heart beat more quickly. °· Blood tests. °· Imaging tests. °TREATMENT  °Your treatment depends on what is causing your chest pain. Treatment may include: °· Medicines. These may include: °¨ Acid blockers for heartburn. °¨ Anti-inflammatory medicine. °¨ Pain medicine for inflammatory conditions. °¨ Antibiotic medicine, if an infection is present. °¨ Medicines to dissolve blood clots. °¨ Medicines to treat coronary artery disease. °· Supportive care for conditions that do not require medicines. This may include: °¨ Resting. °¨ Applying heat   or cold packs to injured areas. °¨ Limiting activities until pain decreases. °HOME CARE INSTRUCTIONS °· If you were prescribed an antibiotic medicine, finish it all even if you start to feel better. °· Avoid any activities that bring on chest pain. °· Do not use any tobacco products, including  cigarettes, chewing tobacco, or electronic cigarettes. If you need help quitting, ask your health care provider. °· Do not drink alcohol. °· Take medicines only as directed by your health care provider. °· Keep all follow-up visits as directed by your health care provider. This is important. This includes any further testing if your chest pain does not go away. °· If heartburn is the cause for your chest pain, you may be told to keep your head raised (elevated) while sleeping. This reduces the chance that acid will go from your stomach into your esophagus. °· Make lifestyle changes as directed by your health care provider. These may include: °¨ Getting regular exercise. Ask your health care provider to suggest some activities that are safe for you. °¨ Eating a heart-healthy diet. A registered dietitian can help you to learn healthy eating options. °¨ Maintaining a healthy weight. °¨ Managing diabetes, if necessary. °¨ Reducing stress. °SEEK MEDICAL CARE IF: °· Your chest pain does not go away after treatment. °· You have a rash with blisters on your chest. °· You have a fever. °SEEK IMMEDIATE MEDICAL CARE IF:  °· Your chest pain is worse. °· You have an increasing cough, or you cough up blood. °· You have severe abdominal pain. °· You have severe weakness. °· You faint. °· You have chills. °· You have sudden, unexplained chest discomfort. °· You have sudden, unexplained discomfort in your arms, back, neck, or jaw. °· You have shortness of breath at any time. °· You suddenly start to sweat, or your skin gets clammy. °· You feel nauseous or you vomit. °· You suddenly feel light-headed or dizzy. °· Your heart begins to beat quickly, or it feels like it is skipping beats. °These symptoms may represent a serious problem that is an emergency. Do not wait to see if the symptoms will go away. Get medical help right away. Call your local emergency services (911 in the [place] Do not drive yourself to the hospital. °  °This  information is not intended to replace advice given to you by your health care provider. Make sure you discuss any questions you have with your health care provider. °  °Document Released: 01/21/2005 Document Revised: 05/04/2014 Document Reviewed: 11/17/2013 °Elsevier Interactive Patient Education ©2016 [employer] ° °

## 2015-09-25 NOTE — ED Notes (Signed)
Troponin elevated; pt taken to room 6 via w/c and placed in hospital gown and on card monitor; care nurse to bedside

## 2015-09-25 NOTE — ED Notes (Signed)
Discharge instructions reviewed with patient. Questions fielded by this RN. Patient verbalizes understanding of instructions. Patient discharged home in stable condition per Stafford MD . No acute distress noted at time of discharge.  

## 2015-12-04 ENCOUNTER — Other Ambulatory Visit: Payer: Self-pay | Admitting: Family Medicine

## 2015-12-06 NOTE — Telephone Encounter (Signed)
Ok to refill? Not on current med list. Already aware to schedule CPE.

## 2016-01-24 ENCOUNTER — Other Ambulatory Visit: Payer: Self-pay | Admitting: Family Medicine

## 2016-01-29 ENCOUNTER — Other Ambulatory Visit: Payer: Self-pay | Admitting: *Deleted

## 2016-01-29 MED ORDER — GLIMEPIRIDE 2 MG PO TABS: ORAL_TABLET | ORAL | 0 refills | Status: DC

## 2016-01-29 MED ORDER — LOSARTAN POTASSIUM 100 MG PO TABS: 100.0000 mg | ORAL_TABLET | Freq: Every day | ORAL | 0 refills | Status: DC

## 2016-01-29 MED ORDER — LABETALOL HCL 300 MG PO TABS: 300.0000 mg | ORAL_TABLET | Freq: Two times a day (BID) | ORAL | 0 refills | Status: DC

## 2016-01-29 MED ORDER — FUROSEMIDE 20 MG PO TABS: 20.0000 mg | ORAL_TABLET | Freq: Every day | ORAL | 0 refills | Status: DC

## 2016-02-20 ENCOUNTER — Ambulatory Visit: Payer: Self-pay | Admitting: Family Medicine

## 2016-02-20 DIAGNOSIS — Z0289 Encounter for other administrative examinations: Secondary | ICD-10-CM

## 2016-03-05 ENCOUNTER — Other Ambulatory Visit: Payer: Self-pay | Admitting: Family Medicine

## 2016-06-12 ENCOUNTER — Other Ambulatory Visit: Payer: Self-pay | Admitting: Family Medicine

## 2016-07-22 ENCOUNTER — Other Ambulatory Visit: Payer: Self-pay | Admitting: Family Medicine

## 2016-07-23 ENCOUNTER — Encounter: Payer: Self-pay | Admitting: *Deleted

## 2018-02-22 ENCOUNTER — Other Ambulatory Visit: Payer: Self-pay | Admitting: Orthopedic Surgery

## 2018-02-22 DIAGNOSIS — G8929 Other chronic pain: Secondary | ICD-10-CM

## 2018-02-22 DIAGNOSIS — M25562 Pain in left knee: Principal | ICD-10-CM

## 2018-02-27 ENCOUNTER — Ambulatory Visit
Admission: RE | Admit: 2018-02-27 | Discharge: 2018-02-27 | Disposition: A | Discharge status: Home / Self Care | Payer: Worker's Compensation | Source: Ambulatory Visit | Attending: Orthopedic Surgery | Admitting: Orthopedic Surgery

## 2018-02-27 DIAGNOSIS — M25562 Pain in left knee: Principal | ICD-10-CM

## 2018-02-27 DIAGNOSIS — G8929 Other chronic pain: Secondary | ICD-10-CM

## 2019-07-21 ENCOUNTER — Ambulatory Visit: Payer: Self-pay

## 2020-04-12 ENCOUNTER — Ambulatory Visit: Payer: 59 | Admitting: Podiatry

## 2020-05-01 ENCOUNTER — Ambulatory Visit: Payer: 59 | Admitting: Podiatry

## 2020-06-06 ENCOUNTER — Ambulatory Visit: Payer: 59 | Admitting: Neurology

## 2020-08-07 ENCOUNTER — Observation Stay
Admission: EM | Admit: 2020-08-07 | Discharge: 2020-08-08 | Disposition: A | Discharge status: Home / Self Care | Payer: 59 | Attending: Family Medicine | Admitting: Family Medicine

## 2020-08-07 ENCOUNTER — Emergency Department: Payer: 59

## 2020-08-07 ENCOUNTER — Observation Stay: Payer: 59

## 2020-08-07 ENCOUNTER — Other Ambulatory Visit: Payer: Self-pay

## 2020-08-07 DIAGNOSIS — N1832 Chronic kidney disease, stage 3b: Secondary | ICD-10-CM | POA: Diagnosis not present

## 2020-08-07 DIAGNOSIS — G4733 Obstructive sleep apnea (adult) (pediatric): Secondary | ICD-10-CM | POA: Insufficient documentation

## 2020-08-07 DIAGNOSIS — E662 Morbid (severe) obesity with alveolar hypoventilation: Secondary | ICD-10-CM | POA: Insufficient documentation

## 2020-08-07 DIAGNOSIS — N179 Acute kidney failure, unspecified: Secondary | ICD-10-CM | POA: Diagnosis not present

## 2020-08-07 DIAGNOSIS — K219 Gastro-esophageal reflux disease without esophagitis: Secondary | ICD-10-CM | POA: Insufficient documentation

## 2020-08-07 DIAGNOSIS — Z6841 Body Mass Index (BMI) 40.0 and over, adult: Secondary | ICD-10-CM | POA: Diagnosis not present

## 2020-08-07 DIAGNOSIS — I639 Cerebral infarction, unspecified: Secondary | ICD-10-CM | POA: Diagnosis present

## 2020-08-07 DIAGNOSIS — I63531 Cerebral infarction due to unspecified occlusion or stenosis of right posterior cerebral artery: Principal | ICD-10-CM

## 2020-08-07 DIAGNOSIS — H538 Other visual disturbances: Secondary | ICD-10-CM | POA: Diagnosis present

## 2020-08-07 DIAGNOSIS — E785 Hyperlipidemia, unspecified: Secondary | ICD-10-CM | POA: Diagnosis not present

## 2020-08-07 DIAGNOSIS — Z79899 Other long term (current) drug therapy: Secondary | ICD-10-CM | POA: Diagnosis not present

## 2020-08-07 DIAGNOSIS — Z20822 Contact with and (suspected) exposure to covid-19: Secondary | ICD-10-CM | POA: Diagnosis not present

## 2020-08-07 DIAGNOSIS — E1122 Type 2 diabetes mellitus with diabetic chronic kidney disease: Secondary | ICD-10-CM | POA: Diagnosis not present

## 2020-08-07 DIAGNOSIS — I4821 Permanent atrial fibrillation: Secondary | ICD-10-CM | POA: Diagnosis not present

## 2020-08-07 DIAGNOSIS — E1165 Type 2 diabetes mellitus with hyperglycemia: Secondary | ICD-10-CM | POA: Insufficient documentation

## 2020-08-07 DIAGNOSIS — I129 Hypertensive chronic kidney disease with stage 1 through stage 4 chronic kidney disease, or unspecified chronic kidney disease: Secondary | ICD-10-CM | POA: Diagnosis not present

## 2020-08-07 HISTORY — DX: Cerebral infarction, unspecified: I63.9

## 2020-08-07 LAB — CBC WITH DIFFERENTIAL/PLATELET
Abs Immature Granulocytes: 0.03 10*3/uL (ref 0.00–0.07)
Basophils Absolute: 0.1 10*3/uL (ref 0.0–0.1)
Basophils Relative: 1 %
Eosinophils Absolute: 0.2 10*3/uL (ref 0.0–0.5)
Eosinophils Relative: 2 %
HCT: 42.6 % (ref 39.0–52.0)
Hemoglobin: 13.8 g/dL (ref 13.0–17.0)
Immature Granulocytes: 0 %
Lymphocytes Relative: 35 %
Lymphs Abs: 3.5 10*3/uL (ref 0.7–4.0)
MCH: 26.7 pg (ref 26.0–34.0)
MCHC: 32.4 g/dL (ref 30.0–36.0)
MCV: 82.4 fL (ref 80.0–100.0)
Monocytes Absolute: 0.9 10*3/uL (ref 0.1–1.0)
Monocytes Relative: 9 %
Neutro Abs: 5.2 10*3/uL (ref 1.7–7.7)
Neutrophils Relative %: 53 %
Platelets: 303 10*3/uL (ref 150–400)
RBC: 5.17 MIL/uL (ref 4.22–5.81)
RDW: 14 % (ref 11.5–15.5)
WBC: 9.9 10*3/uL (ref 4.0–10.5)
nRBC: 0 % (ref 0.0–0.2)

## 2020-08-07 LAB — BASIC METABOLIC PANEL
Anion gap: 11 (ref 5–15)
BUN: 30 mg/dL — ABNORMAL HIGH (ref 6–20)
CO2: 23 mmol/L (ref 22–32)
Calcium: 9.3 mg/dL (ref 8.9–10.3)
Chloride: 102 mmol/L (ref 98–111)
Creatinine, Ser: 2.43 mg/dL — ABNORMAL HIGH (ref 0.61–1.24)
GFR, Estimated: 32 mL/min — ABNORMAL LOW (ref >60)
Glucose, Bld: 242 mg/dL — ABNORMAL HIGH (ref 70–99)
Potassium: 4.6 mmol/L (ref 3.5–5.1)
Sodium: 136 mmol/L (ref 135–145)

## 2020-08-07 LAB — RESP PANEL BY RT-PCR (FLU A&B, COVID) ARPGX2
Influenza A by PCR: NEGATIVE
Influenza B by PCR: NEGATIVE
SARS Coronavirus 2 by RT PCR: NEGATIVE

## 2020-08-07 LAB — CBG MONITORING, ED: Glucose-Capillary: 233 mg/dL — ABNORMAL HIGH (ref 70–99)

## 2020-08-07 LAB — GLUCOSE, CAPILLARY
Glucose-Capillary: 166 mg/dL — ABNORMAL HIGH (ref 70–99)
Glucose-Capillary: 122 mg/dL — ABNORMAL HIGH (ref 70–99)

## 2020-08-07 LAB — SEDIMENTATION RATE: Sed Rate: 3 mm/hr (ref 0–15)

## 2020-08-07 MED ORDER — INSULIN ASPART 100 UNIT/ML ~~LOC~~ SOLN
0.0000 [IU] | Freq: Three times a day (TID) | SUBCUTANEOUS | Status: DC
  Administered 2020-08-07 – 2020-08-08 (×2): 2 [IU] via SUBCUTANEOUS
  Filled 2020-08-07 (×2): qty 1

## 2020-08-07 MED ORDER — ACETAMINOPHEN 500 MG PO TABS
1000.0000 mg | ORAL_TABLET | Freq: Once | ORAL | Status: AC
  Administered 2020-08-07: 1000 mg via ORAL
  Filled 2020-08-07: qty 2

## 2020-08-07 MED ORDER — VITAMIN D (ERGOCALCIFEROL) 1.25 MG (50000 UNIT) PO CAPS: 50000.0000 [IU] | ORAL_CAPSULE | ORAL | Status: DC

## 2020-08-07 MED ORDER — INSULIN ASPART 100 UNIT/ML ~~LOC~~ SOLN: 0.0000 [IU] | Freq: Every day | SUBCUTANEOUS | Status: DC

## 2020-08-07 MED ORDER — FLUORESCEIN SODIUM 1 MG OP STRP
1.0000 | ORAL_STRIP | Freq: Once | OPHTHALMIC | Status: DC
  Filled 2020-08-07: qty 1

## 2020-08-07 MED ORDER — LABETALOL HCL 100 MG PO TABS
100.0000 mg | ORAL_TABLET | Freq: Two times a day (BID) | ORAL | Status: DC
  Administered 2020-08-08: 10:00:00 100 mg via ORAL
  Filled 2020-08-07 (×2): qty 1

## 2020-08-07 MED ORDER — LACTATED RINGERS IV SOLN: INTRAVENOUS | Status: DC

## 2020-08-07 MED ORDER — METOPROLOL TARTRATE 25 MG PO TABS
12.5000 mg | ORAL_TABLET | Freq: Two times a day (BID) | ORAL | Status: DC
  Administered 2020-08-07 – 2020-08-08 (×3): 12.5 mg via ORAL
  Filled 2020-08-07 (×3): qty 1

## 2020-08-07 MED ORDER — ASPIRIN 81 MG PO CHEW: 81.0000 mg | CHEWABLE_TABLET | Freq: Every day | ORAL | Status: DC

## 2020-08-07 MED ORDER — IOHEXOL 350 MG/ML SOLN
75.0000 mL | Freq: Once | INTRAVENOUS | Status: AC | PRN
  Administered 2020-08-07: 75 mL via INTRAVENOUS

## 2020-08-07 MED ORDER — CLONIDINE HCL 0.1 MG PO TABS
0.2000 mg | ORAL_TABLET | Freq: Two times a day (BID) | ORAL | Status: DC
  Administered 2020-08-08 (×2): 0.2 mg via ORAL
  Filled 2020-08-07 (×3): qty 2

## 2020-08-07 MED ORDER — ATORVASTATIN CALCIUM 20 MG PO TABS
80.0000 mg | ORAL_TABLET | Freq: Every day | ORAL | Status: DC
  Administered 2020-08-07: 80 mg via ORAL
  Filled 2020-08-07: qty 4

## 2020-08-07 MED ORDER — DILTIAZEM HCL ER COATED BEADS 120 MG PO CP24
120.0000 mg | ORAL_CAPSULE | Freq: Every day | ORAL | Status: DC
  Administered 2020-08-08: 120 mg via ORAL
  Filled 2020-08-07: qty 1

## 2020-08-07 MED ORDER — LABETALOL HCL 5 MG/ML IV SOLN
5.0000 mg | INTRAVENOUS | Status: DC | PRN
  Administered 2020-08-07: 23:00:00 5 mg via INTRAVENOUS
  Filled 2020-08-07: qty 4

## 2020-08-07 MED ORDER — ASPIRIN 81 MG PO CHEW
81.0000 mg | CHEWABLE_TABLET | Freq: Every day | ORAL | Status: DC
  Administered 2020-08-07 – 2020-08-08 (×2): 81 mg via ORAL
  Filled 2020-08-07 (×2): qty 1

## 2020-08-07 MED ORDER — ACETAMINOPHEN 325 MG PO TABS: 650.0000 mg | ORAL_TABLET | ORAL | Status: DC | PRN

## 2020-08-07 MED ORDER — APIXABAN 5 MG PO TABS: 5.0000 mg | ORAL_TABLET | Freq: Two times a day (BID) | ORAL | Status: DC

## 2020-08-07 MED ORDER — TETRACAINE HCL 0.5 % OP SOLN
1.0000 [drp] | Freq: Once | OPHTHALMIC | Status: DC
  Filled 2020-08-07: qty 4

## 2020-08-07 MED ORDER — FAMOTIDINE 20 MG PO TABS
20.0000 mg | ORAL_TABLET | Freq: Two times a day (BID) | ORAL | Status: DC
  Administered 2020-08-08: 10:00:00 20 mg via ORAL
  Filled 2020-08-07: qty 1

## 2020-08-07 MED ORDER — SODIUM CHLORIDE 0.9 % IV BOLUS
500.0000 mL | Freq: Once | INTRAVENOUS | Status: AC
  Administered 2020-08-07: 500 mL via INTRAVENOUS

## 2020-08-07 MED ORDER — ASPIRIN 81 MG PO CHEW: 81.0000 mg | CHEWABLE_TABLET | Freq: Once | ORAL | Status: DC

## 2020-08-07 NOTE — ED Triage Notes (Addendum)
EDP at bedside performing NIH assessment. Pt to ED via POV c/o L sided peripheral vision loss. Had stroke 8/21. Stopped taking Eliquis 9/21 because couldn't afford.  LKW wqas 1030pm last night. Woke up today at 0600 with L sided peripheral vision loss.

## 2020-08-07 NOTE — ED Notes (Signed)
Visual acuity screening revealed:  20/40 vision: L eye (missed 3 letters on 20/30 line) 20/100 vision: R eye (states R eye has had vision problems already and needs glasses) 20/30 both eyes

## 2020-08-07 NOTE — ED Notes (Signed)
Dr. Hall at bedside.

## 2020-08-07 NOTE — H&P (Addendum)
History and Physical  Randy [name] JOA:[id number] DOB: Mar 11, 1971 DOA: 08/07/2020  Referring physician: Dr. Jari Pigg, [place]. PCP: [facility], [place]  Outpatient Specialists: Cardiology. Patient coming from: Home.  Chief Complaint: Left-sided visual change.  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 fib on Eliquis with noncompliance who presented to [facility] ED with complaints of left sided lateral visual changes.  States around 10:30 PM he felt like the lateral side of his left eye vision was turning black.  He had no other symptoms.  Went to bed last night and this morning when he woke up around 6 AM his symptoms were accentuated.  He decided to come to the ED for further evaluation and management.  States when he was diagnosed with acute CVA previously at [facility] in August 2021 he had right lower extremity pain as well as a speech impairment which resolved.  Patient reports that he had a co-pay of $500 for his Eliquis therefore was most able to continue to take it.  He has his first outpatient visit with cardiology scheduled, but has not taken place yet.  At the time of this visit he reports his left visual change is improved however he has a moderate to severe headache 8 out of 10.  Does not usually have headaches.  Work-up in the ED revealed moderate sized acute right PCA infarct.  Chronic left ACA infarct.  No large vessel occlusion on CT angio head and neck with and without contrast.  Neurology was consulted by EDP.  TRH, hospitalist team, was asked to admit.  ED Course:  Afebrile, BP 151/114.  Lab studies remarkable for serum glucose 242, creatinine 2.43 with baseline creatinine of 1.55 on 09/25/2015.  CBC with differential essentially unremarkable.  Sed rate normal 3.  Review of Systems: Review of systems as noted in the HPI. All  other systems reviewed and are negative.   Past Medical History:  Diagnosis Date  . CKD (chronic kidney disease) stage 3, GFR 30-59 ml/min (HCC)    baseline Cr 1.5-1.7  . Decreased visual acuity    Left eye, resolved - hypertensive retinopathy  . Diabetes mellitus 2012   dx hospitalization with A1c 6.6%  . Hilar adenopathy    on CT scan 02/2010, on rpt scan stable/improved.  Marland Kitchen History of Bell's palsy 9/09   history, Left  . History of headache   . HTN (hypertension), malignant    previously on BC and goody powders for HA  . Internal derangement of knee 9/09   Left  . Microalbuminuria   . Morbid obesity ([place])   . Stroke ([place])   . 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 Systolic murmur   . Vitamin D deficiency    Past Surgical History:  Procedure Laterality Date  . hospitalization  11/2008   malignant HTN, nl SPEP/UPEP, neg ANCA panel, nl C3/4, neg anti GBM Ab, neg Hep A/B/C, nl renal US, nl PTH, neg HIV  . US ECHOCARDIOGRAPHY  11/2008   LVsys fxn EF 50%, mild MR, normal LV size, neg ANA, neg cryoglobulins  . US ECHOCARDIOGRAPHY  2011   severe LVH, EF 40%, LA mildly dilated, PA pressure moderately increased    Social History:  reports that he has never smoked. He has never used smokeless tobacco. He reports current alcohol use. He reports that  he does not use drugs.   Allergies  Allergen Reactions  . Fish-Derived Products Anaphylaxis    Family History  Problem Relation Age of Onset  . Hypertension Mother   . Hypertension Father   . Diabetes Father   . Hypertension Brother   . Diabetes Sister   . Coronary artery disease Neg Hx   . Stroke Neg Hx   . Cancer Neg Hx   . Kidney disease Paternal Grandmother        ESRD      Prior to Admission medications   Medication Sig Start Date End Date Taking? Authorizing Provider  aluminum-magnesium hydroxide-simethicone (MAALOX) 469-629-52 MG/5ML SUSP Take 30 mLs by mouth 4 (four)  times daily -  before meals and at bedtime. 09/25/15   Carrie Mew, MD  famotidine (PEPCID) 20 MG tablet Take 1 tablet (20 mg total) by mouth 2 (two) times daily. 09/25/15   Carrie Mew, MD  furosemide (LASIX) 20 MG tablet TAKE 1 TABLET BY MOUTH ONCE DAILY *PATIENT NEEDS AN APPOINTMENT* 07/23/16   Ria Bush, MD  glimepiride (AMARYL) 2 MG tablet TAKE ONE TABLET BY MOUTH ONCE DAILY BEFORE BREAKFAST 01/29/16   Ria Bush, MD  HYDROcodone-acetaminophen (NORCO/VICODIN) 5-325 MG tablet Take 1 tablet by mouth every 6 (six) hours as needed for moderate pain. 03/27/15   Domenic Moras, PA-C  hydrOXYzine (ATARAX/VISTARIL) 25 MG tablet Take 1 tablet (25 mg total) by mouth every 6 (six) hours. 07/29/15   Patel-Mills, Orvil Feil, PA-C  labetalol (NORMODYNE) 300 MG tablet Take 1 tablet (300 mg total) by mouth 2 (two) times daily. 01/29/16   Ria Bush, MD  losartan (COZAAR) 100 MG tablet Take 1 tablet (100 mg total) by mouth daily. 01/29/16   Ria Bush, MD  predniSONE (STERAPRED UNI-PAK 21 TAB) 10 MG (21) TBPK tablet Take 1 tablet (10 mg total) by mouth daily. Take 6 tabs by mouth daily  for 2 days, then 5 tabs for 2 days, then 4 tabs for 2 days, then 3 tabs for 2 days, 2 tabs for 2 days, then 1 tab by mouth daily for 2 days 07/29/15   Ottie Glazier, PA-C    Physical Exam: BP (!) 148/96   Pulse 93   Temp 98.4 F (36.9 C) (Oral)   Resp 13   Ht 6\' 8"  (2.032 m)   Wt (!) 180.5 kg   SpO2 95%   BMI 43.72 kg/m   . General: 50 y.o. year-old male well developed well nourished in no acute distress.  Alert and oriented x3. . Cardiovascular: Regular rate and rhythm with no rubs or gallops.  No thyromegaly or JVD noted.  No lower extremity edema. 2/4 pulses in all 4 extremities. Marland Kitchen Respiratory: Clear to auscultation with no wheezes or rales. Good inspiratory effort. . Abdomen: Soft nontender nondistended with normal bowel sounds x4 quadrants. . Muskuloskeletal: No cyanosis, clubbing or  edema noted bilaterally . Neuro: CN II-XII intact, strength, sensation, reflexes . Skin: No ulcerative lesions noted or rashes . Psychiatry: Judgement and insight appear normal. Mood is appropriate for condition and setting          Labs on Admission:  Basic Metabolic Panel: Recent Labs  Lab 08/07/20 0852  NA 136  K 4.6  CL 102  CO2 23  GLUCOSE 242*  BUN 30*  CREATININE 2.43*  CALCIUM 9.3   Liver Function Tests: No results for input(s): AST, ALT, ALKPHOS, BILITOT, PROT, ALBUMIN in the last 168 hours. No results for input(s): LIPASE, AMYLASE in the  last 168 hours. No results for input(s): AMMONIA in the last 168 hours. CBC: Recent Labs  Lab 08/07/20 0852  WBC 9.9  NEUTROABS 5.2  HGB 13.8  HCT 42.6  MCV 82.4  PLT 303   Cardiac Enzymes: No results for input(s): CKTOTAL, CKMB, CKMBINDEX, TROPONINI in the last 168 hours.  BNP (last 3 results) No results for input(s): BNP in the last 8760 hours.  ProBNP (last 3 results) No results for input(s): PROBNP in the last 8760 hours.  CBG: Recent Labs  Lab 08/07/20 0847  GLUCAP 233*    Radiological Exams on Admission: CT ANGIO HEAD NECK W WO CM  Result Date: 08/07/2020 CLINICAL DATA:  Left eye vision changes. Headache. History of diabetes and stroke. EXAM: CT ANGIOGRAPHY HEAD AND NECK TECHNIQUE: Multidetector CT imaging of the head and neck was performed using the standard protocol during bolus administration of intravenous contrast. Multiplanar CT image reconstructions and MIPs were obtained to evaluate the vascular anatomy. Carotid stenosis measurements (when applicable) are obtained utilizing NASCET criteria, using the distal internal carotid diameter as the denominator. CONTRAST:  50mL OMNIPAQUE IOHEXOL 350 MG/ML SOLN COMPARISON:  Head CT and neck CTA 03/12/2010 FINDINGS: CT HEAD FINDINGS Brain: There is an acute moderate-sized right PCA infarct involving the right occipital and posterior temporal lobes. There is a  moderate-sized chronic left frontal infarct in the ACA territory which is new from 2011 (acute in 11/2019 based on outside MRI report). Patchy hypodensities elsewhere in the cerebral white matter bilaterally have progressed from the 2011 CT and are nonspecific but compatible with moderately age advanced chronic small vessel ischemic disease. The ventricles are normal in size. No acute intracranial hemorrhage, mass, midline shift, or extra-axial fluid collection is identified. Vascular: Calcified atherosclerosis at the skull base. Skull: No fracture or suspicious osseous lesion. Sinuses: Clear paranasal sinuses. Moderately large right and small left mastoid effusions. Orbits: Unremarkable. Review of the MIP images confirms the above findings CTA NECK FINDINGS Aortic arch: Standard 3 vessel aortic arch with widely patent arch vessel origins. Right carotid system: Patent with minimal calcified plaque at the carotid bifurcation. No evidence of dissection or stenosis. Left carotid system: Patent with minimal calcified plaque at the carotid bifurcation. No evidence of dissection or stenosis. Vertebral arteries: Patent and codominant without evidence of a significant stenosis or dissection although shoulder artifact limits assessment of the proximal V1 segments. Skeleton: No acute osseous abnormality or suspicious osseous lesion. Other neck: Prominent number of small lymph nodes in the neck bilaterally with the largest measuring approximately 1 cm in short axis in level II, similar to the 2011 CTA and presumably benign. Chronic moderate diffuse enlargement of the thyroid gland without significant heterogeneity or a discrete nodule and for which no imaging follow-up is recommended. Upper chest: Clear lung apices. Review of the MIP images confirms the above findings CTA HEAD FINDINGS Contrast timing is suboptimal which limits assessment of the small and medium-sized arteries. Anterior circulation: The internal carotid  arteries are patent from skull base to carotid termini with atherosclerotic plaque resulting in moderate left cavernous stenosis. ACAs and MCAs are patent proximally, however suboptimal contrast timing limits assessment of the branch vessels as well as for proximal stenosis. No aneurysm is identified. Posterior circulation: The intracranial vertebral arteries are patent to the basilar with minimal calcified plaque in the right V4 segment not resulting in a significant stenosis. The basilar artery is patent with a severe stenosis distally at the level of the SCA origins. Both PCAs are  patent, and there is evidence of a moderate stenosis at the right P1 origin. No aneurysm is identified. Venous sinuses: Patent. Anatomic variants: None. Review of the MIP images confirms the above findings IMPRESSION: 1. Moderate-sized acute right PCA infarct. 2. Chronic left ACA infarct. 3. Moderately age advanced chronic small vessel ischemic disease. 4. No large vessel occlusion. 5. 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 6. Patent cervical carotid and vertebral arteries without evidence of a significant stenosis (noting poor assessment of the proximal V1 segments). Electronically Signed   By: Logan Bores M.D.   On: 08/07/2020 10:58    EKG: I independently viewed the EKG done and my findings are as followed: A. fib rate of 91.  Nonspecific ST-T changes.  QTc 451.  Assessment/Plan Present on Admission: . Acute CVA (cerebrovascular accident) [facility])  Active Problems:   Acute CVA (cerebrovascular accident) ([place])  Acute CVA involving right PCA Last known well 10:30 PM on 08/06/2020.  Out of window for TPA. Presented with left-sided lateral visual changes. Prior history of CVA in August 2021, diagnosed at outside institution.. CT angio of head and neck revealed moderate sized acute right PCA infarct.  Chronic  left ACA infarct.  No large vessel occlusion. Neurology consulted by EDP. Per patient he had a complete stroke work-up at [facility] in August 2021 when he was first diagnosed with a stroke. Obtain fasting lipid panel and A1c for risk stratification. Obtain 2D echo with bubble study. Reports compliant with home aspirin 81 mg daily. Resume home Lipitor, increased dose to 80 mg daily Patient passed a swallow exam at bedside. PT OT to assess Neurochecks every 4 hours Permissive hypertension up to 220/110 for 24 hours and manage blood pressure towards goal of 283 systolic over the long-term. Rest of management per neurology. Case manager consult to assist with medications, patient has had trouble affording his medications.  Permanent A. fib noncompliant with Eliquis due to lack of affordability Resume home Eliquis Case manager consulted to assist the patient with cost of his medications May consider checking co-pay for Coumadin Resume home Lopressor at lower dose to avoid beta-blockade withdrawal. Monitor on telemetry  Type 2 diabetes with hyperglycemia Obtain A1c Start insulin sliding scale Hold off home oral hypoglycemics.  Essential hypertension Hold off home oral antihypertensives Ongoing permissive hypertension Continue to closely monitor vital signs  Hyperlipidemia Resume home Lipitor, increased dose to 80 mg daily.  GERD Resume home PPI  History of CHF, unspecified, no records available at the time of this visit. We will hold off home diuretics for now.  Obesity BMI 43 Recommend weight loss outpatient with regular physical activity and healthy dieting.  CKD 3B Unclear what his new baseline is Per medical record last creatinine 1.5 in 2017 with GFR greater than 60. Presented with creatinine of 2.4 with GFR of 32 Avoid nephrotoxic agents Gentle IV fluid hydration LR 30 cc/h x 1 day. Monitor urine output Repeat renal panel in the morning.    DVT prophylaxis: Subcu  heparin 3 times daily  Code Status: Full code as stated by the patient himself.  Family Communication: Went over the management of his current condition with his wife at bedside.  All questions answered to the best of my ability.  Disposition Plan: Admit to MedSurg with remote telemetry.  Consults called: Neurology consulted by EDP.  Admission status: Observation status.   Status is: Observation  Dispo:  Patient From: Home  Planned Disposition: Home  Medically stable for discharge: No      Darlin Drop MD [facility] Pager [phone]  If 7PM-7AM, please contact night-coverage [email or web address] Password [facility]  08/07/2020, 12:51 PM

## 2020-08-07 NOTE — ED Notes (Signed)
Neurologist at bedside. 

## 2020-08-07 NOTE — ED Notes (Signed)
Pt in CT.

## 2020-08-07 NOTE — ED Notes (Signed)
Md Margo Aye contacted regarding patient's blood pressure. Awaiting orders at this time.

## 2020-08-07 NOTE — ED Provider Notes (Signed)
[facility] Emergency Department Provider Note  ____________________________________________   Event Date/Time   First MD Initiated Contact with Patient 08/07/20 [phone]     (approximate)  I have reviewed the triage vital signs and the nursing notes.   HISTORY  Chief Complaint Vision changes   HPI Zalmen Wrightsman is a 50 y.o. male with uncontrolled diabetes, A. fib with RVR who comes in with vision difficulty.  Patient reports last known normal was last night around 10:30 PM on 08/06/2020.  Patient woke up with blurriness in his left eye.  Patient states that it appears blurry on the peripheral side of his left eye.  He states that he cannot see peripherally any longer.  This is been constant since he woke up this morning, nothing makes better, nothing makes it worse.  Denies any other associated symptoms such as fevers, headache, chest pain, shortness of breath, leg weakness, aphasia.  Patient has not been compliant with his Eliquis secondary to difficulties affording it.  Patient denies any pain around the left eye but does report some pain around the right eye.  Patient does report baseline blurry vision in his right eye but denies this is any different than baseline.  He states that he is not wearing any contacts or glasses.   On review of records patient had admission back in August 2021 for stroke, A. fib, hypertension.  Patient was supposed to be on Eliquis after discharge.  Patient reports taking a baby aspirin.          Past Medical History:  Diagnosis Date  . CKD (chronic kidney disease) stage 3, GFR 30-59 ml/min (HCC)    baseline Cr 1.5-1.7  . Decreased visual acuity    Left eye, resolved - hypertensive retinopathy  . Diabetes mellitus 2012   dx hospitalization with A1c 6.6%  . Hilar adenopathy    on CT scan 02/2010, on rpt scan stable/improved.  Marland Kitchen History of Bell's palsy 9/09   history, Left  . History of headache   . HTN (hypertension),  malignant    previously on BC and goody powders for HA  . Internal derangement of knee 9/09   Left  . Microalbuminuria   . Morbid obesity ([place])   . 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 Systolic murmur   . Vitamin D deficiency     Patient Active Problem List   Diagnosis Date Noted  . Acute upper respiratory infection 11/27/2014  . Numbness of finger 09/19/2011  . Hilar adenopathy   . 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[place]) 96/07/5407  . Uncontrolled type 2 diabetes mellitus with microalbuminuria ([place]) 04/23/2010  . Renal insufficiency 03/21/2008  . VISUAL ACUITY, DECREASED, LEFT EYE 03/19/2008  . BELL'S PALSY, LEFT 01/30/2008  . Morbid obesity ([place]) 01/20/2008  . 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 hospitalization  11/2008   malignant HTN, nl SPEP/UPEP, neg ANCA panel, nl C3/4, neg anti GBM Ab, neg Hep A/B/C, nl renal US, nl PTH, neg HIV  . US ECHOCARDIOGRAPHY  11/2008   LVsys fxn EF 50%, mild MR, normal LV size, neg ANA, neg cryoglobulins  . US ECHOCARDIOGRAPHY  2011   severe LVH, EF 40%, LA mildly dilated, PA pressure moderately increased    Prior to Admission medications   Medication Sig Start Date End Date Taking? Authorizing Provider  aluminum-magnesium hydroxide-simethicone (MAALOX) 811-914-78 MG/5ML  SUSP Take 30 mLs by mouth 4 (four) times daily -  before meals and at bedtime. 09/25/15   Carrie Mew, MD  famotidine (PEPCID) 20 MG tablet Take 1 tablet (20 mg total) by mouth 2 (two) times daily. 09/25/15   Carrie Mew, MD  furosemide (LASIX) 20 MG tablet TAKE 1 TABLET BY MOUTH ONCE DAILY *PATIENT NEEDS AN APPOINTMENT* 07/23/16   Ria Bush, MD  glimepiride (AMARYL) 2 MG tablet TAKE ONE TABLET BY MOUTH ONCE DAILY BEFORE BREAKFAST 01/29/16   Ria Bush, MD  HYDROcodone-acetaminophen (NORCO/VICODIN) 5-325 MG tablet Take  1 tablet by mouth every 6 (six) hours as needed for moderate pain. 03/27/15   Domenic Moras, PA-C  hydrOXYzine (ATARAX/VISTARIL) 25 MG tablet Take 1 tablet (25 mg total) by mouth every 6 (six) hours. 07/29/15   Patel-Mills, Orvil Feil, PA-C  labetalol (NORMODYNE) 300 MG tablet Take 1 tablet (300 mg total) by mouth 2 (two) times daily. 01/29/16   Ria Bush, MD  losartan (COZAAR) 100 MG tablet Take 1 tablet (100 mg total) by mouth daily. 01/29/16   Ria Bush, MD  predniSONE (STERAPRED UNI-PAK 21 TAB) 10 MG (21) TBPK tablet Take 1 tablet (10 mg total) by mouth daily. Take 6 tabs by mouth daily  for 2 days, then 5 tabs for 2 days, then 4 tabs for 2 days, then 3 tabs for 2 days, 2 tabs for 2 days, then 1 tab by mouth daily for 2 days 07/29/15   Patel-Mills, Orvil Feil, PA-C    Allergies Fish-derived products and Iodine  Family History  Problem Relation Age of Onset  . Hypertension Mother   . Hypertension Father   . Diabetes Father   . Hypertension Brother   . Diabetes Sister   . Coronary artery disease Neg Hx   . Stroke Neg Hx   . Cancer Neg Hx   . Kidney disease Paternal Grandmother        ESRD    Social History Social History   Tobacco Use  . Smoking status: Never Smoker  . Smokeless tobacco: Never Used  . Tobacco comment: monthly, when going out  Substance Use Topics  . Alcohol use: Yes    Comment: Occasional  . Drug use: No      Review of Systems Constitutional: No fever/chills Eyes: Positive vision changes ENT: No sore throat. Cardiovascular: Denies chest pain. Respiratory: Denies shortness of breath. Gastrointestinal: No abdominal pain.  No nausea, no vomiting.  No diarrhea.  No constipation. Genitourinary: Negative for dysuria. Musculoskeletal: Negative for back pain. Skin: Negative for rash. Neurological: Positive headache on the right side., focal weakness or numbness. All other ROS negative ____________________________________________   PHYSICAL EXAM:  VITAL  SIGNS: Blood pressure (!) 131/96, pulse 96, temperature (!) 97.3 F (36.3 C), temperature source Oral, resp. rate 17, height 6' 8"  (2.032 m), weight (!) 180.5 kg, SpO2 100 %.   Constitutional: Alert and oriented. Well appearing and in no acute distress. Eyes: Conjunctivae are normal. EOMI. pupils are equal and reactive bilaterally. Head: Atraumatic. Nose: No congestion/rhinnorhea. Mouth/Throat: Mucous membranes are moist.   Neck: No stridor. Trachea Midline. FROM Cardiovascular: Normal rate, regular rhythm. Grossly normal heart sounds.  Good peripheral circulation. Respiratory: Normal respiratory effort.  No retractions. Lungs CTAB. Gastrointestinal: Soft and nontender. No distention. No abdominal bruits.  Musculoskeletal: No lower extremity tenderness nor edema.  No joint effusions. Neurologic:  Normal speech and language. No gross focal neurologic deficits are appreciated.  Cranial nerves are intact other than patient is reporting peripheral  vision loss of the left eye. Skin:  Skin is warm, dry and intact. No rash noted. Psychiatric: Mood and affect are normal. Speech and behavior are normal. GU: Deferred   ____________________________________________   LABS (all labs ordered are listed, but only abnormal results are displayed)  Labs Reviewed  BASIC METABOLIC PANEL - Abnormal; Notable for the following components:      Result Value   Glucose, Bld 242 (*)    BUN 30 (*)    Creatinine, Ser 2.43 (*)    GFR, Estimated 32 (*)    All other components within normal limits  CBG MONITORING, ED - Abnormal; Notable for the following components:   Glucose-Capillary 233 (*)    All other components within normal limits  RESP PANEL BY RT-PCR (FLU A&B, COVID) ARPGX2  CBC WITH DIFFERENTIAL/PLATELET  SEDIMENTATION RATE   ____________________________________________   ED ECG REPORT I, Vanessa Gooding, the attending physician, personally viewed and interpreted this ECG.  Atrial fibrillation  rate of 91, no ST elevation, no T wave inversions, normal intervals ____________________________________________  RADIOLOGY   Official radiology report(s): CT ANGIO HEAD NECK W WO CM  Result Date: 08/07/2020 CLINICAL DATA:  Left eye vision changes. Headache. History of diabetes and stroke. EXAM: CT ANGIOGRAPHY HEAD AND NECK TECHNIQUE: Multidetector CT imaging of the head and neck was performed using the standard protocol during bolus administration of intravenous contrast. Multiplanar CT image reconstructions and MIPs were obtained to evaluate the vascular anatomy. Carotid stenosis measurements (when applicable) are obtained utilizing NASCET criteria, using the distal internal carotid diameter as the denominator. CONTRAST:  53m OMNIPAQUE IOHEXOL 350 MG/ML SOLN COMPARISON:  Head CT and neck CTA 03/12/2010 FINDINGS: CT HEAD FINDINGS Brain: There is an acute moderate-sized right PCA infarct involving the right occipital and posterior temporal lobes. There is a moderate-sized chronic left frontal infar[place] in the [place] which is new from 2011 (acute in 11/2019 based on outside MRI report). Patchy hypodensities elsewhere in the cerebral white matter bilaterally have progressed from the 2011 CT and are nonspecific but compatible with moderately age advanced chronic small vessel ischemic disease. The ventricles are normal in size. No acute intracranial hemorrhage, mass, midline shift, or extra-axial fluid collection is identified. Vascular: Calcified atherosclerosis at the skull base. Skull: No fracture or suspicious osseous lesion. Sinuses: Clear paranasal sinuses. Moderately large right and small left mastoid effusions. Orbits: Unremarkable. Review of the MIP images confirms the above findings CTA NECK FINDINGS Aortic arch: Standard 3 vessel aortic arch with widely patent arch vessel origins. Right carotid system: Patent with minimal calcified plaque at the carotid bifurcation. No evidence of dissection or  stenosis. Left carotid system: Patent with minimal calcified plaque at the carotid bifurcation. No evidence of dissection or stenosis. Vertebral arteries: Patent and codominant without evidence of a significant stenosis or dissection although shoulder artifact limits assessment of the proximal V1 segments. Skeleton: No acute osseous abnormality or suspicious osseous lesion. Other neck: Prominent number of small lymph nodes in the neck bilaterally with the largest measuring approximately 1 cm in short axis in level II, similar to the 2011 CTA and presumably benign. Chronic moderate diffuse enlargement of the thyroid gland without significant heterogeneity or a discrete nodule and for which no imaging follow-up is recommended. Upper chest: Clear lung apices. Review of the MIP images confirms the above findings CTA HEAD FINDINGS Contrast timing is suboptimal which limits assessment of the small and medium-sized arteries. Anterior circulation: The internal carotid arteries are patent from skull  base to carotid termini with atherosclerotic plaque resulting in moderate left cavernous stenosis. ACAs and MCAs are patent proximally, however suboptimal contrast timing limits assessment of the branch vessels as well as for proximal stenosis. No aneurysm is identified. Posterior circulation: The intracranial vertebral arteries are patent to the basilar with minimal calcified plaque in the right V4 segment not resulting in a significant stenosis. The basilar artery is patent with a severe stenosis distally at the level of the SCA origins. Both PCAs are patent, and there is evidence of a moderate stenosis at the right P1 origin. No aneurysm is identified. Venous sinuses: Patent. Anatomic variants: None. Review of the MIP images confirms the above findings IMPRESSION: 1. Moderate-sized acute right PCA infarct. 2. Chronic left ACA infarct. 3. Moderately age advanced chronic small vessel ischemic disease. 4. No large vessel  occlusion. 5. 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 6. Patent cervical carotid and vertebral arteries without evidence of a significant stenosis (noting poor assessment of the proximal V1 segments). Electronically Signed   By: Logan Bores M.D.   On: 08/07/2020 10:58    ____________________________________________   PROCEDURES  Procedure(s) performed (including Critical Care):  .1-3 Lead EKG Interpretation Performed by: Vanessa [place], MD Authorized by: Vanessa Markle, MD     Interpretation: abnormal     ECG rate:  80s    ECG rate assessment: normal     Rhythm: atrial fibrillation     Ectopy: none     Conduction: normal       ____________________________________________   INITIAL IMPRESSION / ASSESSMENT AND PLAN / ED COURSE  Morley Gaumer was evaluated in Emergency Department on 08/07/2020 for the symptoms described in the history of present illness. He was evaluated in the context of the global COVID-19 pandemic, which necessitated consideration that the patient might be at risk for infection with the SARS-CoV-2 virus that causes COVID-19. Institutional protocols and algorithms that pertain to the evaluation of patients at risk for COVID-19 are in a state of rapid change based on information released by regulatory bodies including the CDC and federal and state organizations. These policies and algorithms were followed during the patient's care in the ED.    Patient is a 50 year old with known atrial fibrillation not on anticoagulation after having large stroke due to inability to afford who comes in with vision changes to the left eye.  No pain on the left eye but does report some headache sensation and some pain around the right eye.  Patient's vision is 20/40 on the left and 20/100 on the right.  Patient reports baseline vision issues with the right eye but states  that the new issue is peripheral vision loss on the left eye.  Given patient's history of A. fib with noncompliance with Eliquis I am most concerned about a potential neurological process so we will get CTA to evaluate for thrombus.  Will get ESR to evaluate for temporal arteritis.  At this time patient is out of the window for TPA given his last known well was yesterday 4/12/0.22 at 10:30 PM and he does not have high enough NIH stroke scale for LVO.  I have low suspicion for dissection but will image the neck as well to make sure.  Patient had a report of allergy to contrast but patient states that he only had had an allergy to shellfish and seafood.  He has had  Chest CT 5 years ago and denies having a prep for that.  He also most recently had a CTA head and neck in August 2021 that was a stroke code and he denies getting eye prep for that and they took him right to CT when he got there.  Therefore I have taken the contrast allergy out since he states that it was just theoretical since he was allergic to shellfish.  Patient's kidney function is slightly elevated from baseline we will give a little bit of fluid   CT scan shows new stroke  Discussed with Dr.  Bartholome Bill who will recommend admission and will come down to see patient but at this time he is out of the window for any interventions.  On reevaluation updated on results he states that the vision is getting better.  At this time I have low suspicion for acute ophthalmology occult process given I suspect it is more likely from the stroke.  His pupils reactive no evidence of glaucoma and does not sound consistent with retina issue       ____________________________________________   FINAL CLINICAL IMPRESSION(S) / ED DIAGNOSES   Final diagnoses:  Acute right PCA stroke (HCC)      MEDICATIONS GIVEN DURING THIS VISIT:  Medications  tetracaine (PONTOCAINE) 0.5 % ophthalmic solution 1 drop (has no administration in time range)   fluorescein ophthalmic strip 1 strip (has no administration in time range)  acetaminophen (TYLENOL) tablet 1,000 mg (has no administration in time range)  sodium chloride 0.9 % bolus 500 mL (500 mLs Intravenous New Bag/Given 08/07/20 0932)  iohexol (OMNIPAQUE) 350 MG/ML injection 75 mL (75 mLs Intravenous Contrast Given 08/07/20 1015)     ED Discharge Orders    None       Note:  This document was prepared using Dragon voice recognition software and may include unintentional dictation errors.   Vanessa Lucas, MD 08/07/20 [phone]

## 2020-08-07 NOTE — Consult Note (Signed)
NEUROLOGY CONSULTATION NOTE   Date of service: August 07, 2020 Patient Name: Randy [name] MRN:  [id number] DOB:  10-Sep-1970 Reason for consult: "R PCA Stroke" _ _ _   _ __   _ __ _ _  __ __   _ __   __ _  History of Present Illness  Randy [name] is a 50 y.o. 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  He is not entirely sure of when his vision deficits started but he first noticed this around 2200 on 08/06/20. He took aspirin 81mg  and went to bed and woke up in AM with persistent vision deficit. His wife convinced him to come to the ED. Workup with CTH w/o contrast with moderate size acute R PCA stroke, known chronic L ACA stroke. 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   LKW: unclear, first noticed on 2200 on 08/06/20 but might have been going on for longer per patient. MRS: 0 TPA: outside the window Thrombectomy: Has a distinct stroke in the R PCA territory with clear hypodensity in that territory with loss of gray white differentiation. This is likely a completed stroke and unfortunately he is not going to benefit from intervention at this time.   ROS   Constitutional Denies weight loss, fever and chills.   HEENT Denies changes in vision and hearing.   Respiratory Denies SOB and cough.   CV Denies palpitations and CP   GI Denies abdominal pain, nausea, vomiting and diarrhea.   GU Denies dysuria and urinary frequency.   MSK Denies myalgia and joint pain.   Skin Denies rash and pruritus.   Neurological Denies headache and syncope.   Psychiatric Denies recent changes in mood. Denies anxiety and depression.    Past History   Past Medical History:  Diagnosis Date  . CKD (chronic kidney disease) stage 3, GFR 30-59 ml/min (HCC)     baseline Cr 1.5-1.7  . Decreased visual acuity    Left eye, resolved - hypertensive retinopathy  . Diabetes mellitus 2012   dx hospitalization with A1c 6.6%  . Hilar adenopathy    on CT scan 02/2010, on rpt scan stable/improved.  03/2010 History of Bell's palsy 9/09   history, Left  . History of headache   . HTN (hypertension), malignant    previously on BC and goody powders for HA  . Internal derangement of knee 9/09   Left  . Microalbuminuria   . Morbid obesity (HCC)   . Stroke (HCC)   . 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HCC)    echo 2011 with nonischemic hypertensive cardiomyopathy  . Systolic murmur   . Vitamin D deficiency    Past Surgical History:  Procedure Laterality Date  . hospitalization  11/2008   malignant HTN, nl SPEP/UPEP, neg ANCA panel, nl C3/4, neg anti GBM Ab, neg Hep A/B/C, nl renal 12/2008, nl PTH, neg HIV  . US ECHOCARDIOGRAPHY  11/2008   LVsys fxn EF 50%, mild MR, normal LV size, neg ANA, neg cryoglobulins  . 12/2008 ECHOCARDIOGRAPHY  2011   severe LVH, EF 40%, LA mildly dilated, PA pressure moderately increased   Family History  Problem Relation Age of Onset  . Hypertension Mother   . Hypertension Father   . Diabetes Father   .  Hypertension Brother   . Diabetes Sister   . Coronary artery disease Neg Hx   . Stroke Neg Hx   . Cancer Neg Hx   . Kidney disease Paternal Grandmother        ESRD   Social History   Socioeconomic History  . Marital status: Married    Spouse name: Not on file  . Number of children: Not on file  . Years of education: Not on file  . Highest education level: Not on file  Occupational History  . Not on file  Tobacco Use  . Smoking status: Never Smoker  . Smokeless tobacco: Never Used  . Tobacco comment: monthly, when going out  Substance and Sexual Activity  . Alcohol use: Yes    Comment: Occasional  . Drug use: No  . Sexual activity: Not on file  Other Topics Concern  . 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 Fish-Derived Products Anaphylaxis    Medications  (Not in a hospital admission)    Vitals   Vitals:   08/07/20 1133 08/07/20 1200 08/07/20 1230 08/07/20 1330  BP: (!) 149/108 (!) 151/114 (!) 148/96 (!) 150/107  Pulse: 81 74 93 (!) 55  Resp: 20 15 13 16   Temp: 98.4 F (36.9 C)     TempSrc: Oral     SpO2: 100% 99% 95% 100%  Weight:      Height:         Body mass index is 43.72 kg/m.  Physical Exam   General: Laying comfortably in bed; in no acute distress.  HENT: Normal oropharynx and mucosa. Normal external appearance of ears and nose.  Neck: Supple, no pain or tenderness  CV: No JVD. No peripheral edema.  Pulmonary: Symmetric Chest rise. Normal respiratory effort.  Abdomen: Soft to touch, non-tender.  Ext: No cyanosis, edema, or deformity  Skin: No rash. Normal palpation of skin.   Musculoskeletal: Normal digits and nails by inspection. No clubbing.   Neurologic Examination  Mental status/Cognition: Alert, oriented to self, place, month and year, good attention.  Speech/language: Fluent, comprehension intact, object naming intact, repetition intact.  Cranial nerves:   CN II Pupils equal and reactive to light, L hemianopsia   CN III,IV,VI EOM intact, no gaze preference or deviation, no nystagmus    CN V normal sensation in V1, V2, and V3 segments bilaterally    CN VII no asymmetry, no nasolabial fold flattening    CN VIII normal hearing to speech    CN IX & X normal palatal elevation, no uvular deviation    CN XI 5/5 head turn and 5/5 shoulder shrug bilaterally     CN XII midline tongue protrusion    Motor:  Muscle bulk: normal, tone normal, pronator drift none tremor none Mvmt Root Nerve  Muscle Right Left Comments  SA C5/6 Ax Deltoid 5 5   EF C5/6 Mc Biceps 5 5   EE C6/7/8 Rad Triceps 5 5   WF C6/7 Med FCR  WE C7/8 PIN ECU     F Ab C8/T1 U ADM/FDI 5 5   HF L1/2/3 Fem Illopsoas 5 5   KE L2/3/4 Fem Quad 5 5   DF L4/5 D Peron Tib Ant 5 5   PF S1/2 Tibial Grc/Sol 5 5    Reflexes:  Right Left Comments  Pectoralis      Biceps (C5/6) 2 2   Brachioradialis (C5/6) 2 2    Triceps (C6/7) 2 2    Patellar (L3/4) 1 1    Achilles (S1) 1 1    Hoffman      Plantar     Jaw jerk    Sensation:  Light touch Intact throughout   Pin prick    Temperature    Vibration   Proprioception    Coordination/Complex Motor:  - Finger to Nose intact BL - Heel to shin intact BL - Rapid alternating movement are normal - Gait: deferred.  Labs   CBC:  Recent Labs  Lab 08/07/20 0852  WBC 9.9  NEUTROABS 5.2  HGB 13.8  HCT 42.6  MCV 82.4  PLT 101    Basic Metabolic Panel:  Lab Results  Component Value Date   NA 136 08/07/2020   K 4.6 08/07/2020   CO2 23 08/07/2020   GLUCOSE 242 (H) 08/07/2020   BUN 30 (H) 08/07/2020   CREATININE 2.43 (H) 08/07/2020   CALCIUM 9.3 08/07/2020   GFRNONAA 32 (L) 08/07/2020   GFRAA >60 09/25/2015   Lipid Panel:  Lab Results  Component Value Date   LDLCALC 69 11/10/2013   HgbA1c:  Lab Results  Component Value Date   HGBA1C 12.5 (H) 11/28/2014   Urine Drug Screen:     Component Value Date/Time   LABOPIA NONE DETECTED 03/12/2010 1222   COCAINSCRNUR NONE DETECTED 03/12/2010 1222   LABBENZ NONE DETECTED 03/12/2010 1222   AMPHETMU NONE DETECTED 03/12/2010 1222   THCU NONE DETECTED 03/12/2010 1222   LABBARB  03/12/2010 1222    NONE DETECTED        DRUG SCREEN FOR MEDICAL PURPOSES ONLY.  IF CONFIRMATION IS NEEDED FOR ANY PURPOSE, NOTIFY LAB WITHIN 5 DAYS.        LOWEST DETECTABLE LIMITS FOR URINE  DRUG SCREEN Drug Class       Cutoff (ng/mL) Amphetamine      1000 Barbiturate      200 Benzodiazepine   751 Tricyclics       025 Opiates          300 Cocaine          300 THC              50    Alcohol Level No results found for: Randy [name]  CT Head without contrast: 1. Moderate-sized acute right PCA infarct. 2. Chronic left ACA infarct. 3. Moderately age advanced chronic small vessel ischemic disease.  CT Angio head and Neck: 1. No large vessel occlusion. 2. 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 3. Patent cervical carotid and vertebral arteries without evidence of a significant stenosis (noting poor assessment of the proximal V1 segments).  MRI Brain: pending  Impression   Thurston Brendlinger is a 50 y.o. male with PMH significant for pAfibb who is not on AC due to inability to afford Apixaban, hx of DM2, headache, prior L frontal stroke with no residual symptoms who presents with acute onset L visual field  deficit, unclear LKW but first noticed at 2200 on 08/06/20. 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 outsid the widnow for tPA, no thrombectomy because he has a distinct stroke in the R PCA territory with clear hypodensity in that territory with loss of gray white differentiation. This is likely a completed stroke and unfortunately he is not going to benefit from intervention at this time.  I suspect that this stroke was likely due to him not being on Anticoagulation. Our priority for him is to get an anticoagulation plan in place before we get him out of the hospital.  Recommendations  Plan:  - Frequent Neuro checks per stroke unit protocol - MRI Brain w/o contrast is pending. - TTE is pending. Last scan per care everywhere in Aug 2021 with EF of 40-45%, no IAS on agitated  saline study. - LDL pending, continue home Atorvastatin 80mg  daily - HbA1c is pending. - Antithrombotic - Continue Aspirin 81mg  daily - Will decide on time to start Anticoagulation(and stop Aspirin and DVT ppx) based on the size of stroke on MRI Brain. - Recommend DVT ppx for now, until Anticoagulation. - SBP goal - permissive hypertension first 24 h < 220/110. Hold home meds.  - Recommend Telemetry monitoring for arrythmia - Recommend bedside swallow screen prior to PO intake. - Stroke education booklet - Recommend PT/OT/SLP consult - Agree with Social work consult. Our goal for this admission is to figure out a plan for Anticoagulation. If there continues to be affordability issues with Eliquis, will unfortunately need to switch him to warfarin. I also spoke to our pharmacy team and a copayment of $500 a month for Eliquis is a little steep from my experience in the past, specially with insurance coverage. They will help clarify if the indication and the script is correct and see if we can get this sorted out too. ______________________________________________________________________   Thank you for the opportunity to take part in the care of this patient. If you have any further questions, please contact the neurology consultation attending.  Signed,  [facility] Pager Number [phone] _ _ _   _ __   _ __ _ _  __ __   _ __   __ _

## 2020-08-07 NOTE — Consult Note (Signed)
ANTICOAGULATION CONSULT NOTE - Follow Up Consult  Pharmacy Consult for Apixaban  Indication: atrial fibrillation   Allergies  Allergen Reactions  . Fish-Derived Products Anaphylaxis    Patient Measurements: Height: 6\' 8"  (203.2 cm) Weight: (!) 180.5 kg (398 lb) IBW/kg (Calculated) : 96  Vital Signs: Temp: 98.1 F (36.7 C) (04/13 1528) Temp Source: Oral (04/13 1528) BP: 151/83 (04/13 1528) Pulse Rate: 64 (04/13 1528)  Labs: Recent Labs    08/07/20 0852  HGB 13.8  HCT 42.6  PLT 303  CREATININE 2.43*    Estimated Creatinine Clearance: 69 mL/min (A) (by C-G formula based on SCr of 2.43 mg/dL (H)).   Medications:  On Eliquis previously, but no AC recently PTA d/t med-cost related non-compliance. Continues on PTA ASA 81 QD. DDI: diltiazem (not ordered this admission), ASA 81 (ordered)   Assessment: 50 yo Male w/ h/o HTN (c/b hypertensive retinopathy), niCMP (EF 40% - 2011), T2DM, obesity, prior CVA (w/o residual deficits), permanent A. fib (on Eliquis with affordability related noncompliance - pt stopped 12/2019), CKD 3 (BL1.5-1.7) presenting with C/O left sided lateral visual changes. W/U in ED showed moderate acute Rt PCA infarct and chronic Lt ACA infarct, w/o LVO on CT angio. Pharmacy consulted for re-start of Eliquis for Afib indication.  H/H/Plts: WNL  Goal of Therapy:  Monitor platelets by anticoagulation protocol: Yes   Plan:  Initiate Eliquis 5mg  BID (only meeting 1 of 3 criteria) to start 4/13 1630. CTM with CBC as indicated per protocol  08/07/2020,3:46 PM

## 2020-08-07 NOTE — ED Notes (Signed)
EDP has seen pt. Does not need to sign e waiver.

## 2020-08-07 NOTE — ED Notes (Signed)
Pt ambulatory to bathroom

## 2020-08-08 ENCOUNTER — Observation Stay: Payer: 59

## 2020-08-08 DIAGNOSIS — I63531 Cerebral infarction due to unspecified occlusion or stenosis of right posterior cerebral artery: Secondary | ICD-10-CM | POA: Diagnosis not present

## 2020-08-08 LAB — LIPID PANEL
Cholesterol: 91 mg/dL (ref 0–200)
HDL: 39 mg/dL — ABNORMAL LOW (ref >40)
LDL Cholesterol: 38 mg/dL (ref 0–99)
Total CHOL/HDL Ratio: 2.3 RATIO
Triglycerides: 69 mg/dL (ref <150)
VLDL: 14 mg/dL (ref 0–40)

## 2020-08-08 LAB — MAGNESIUM: Magnesium: 1.7 mg/dL (ref 1.7–2.4)

## 2020-08-08 LAB — BASIC METABOLIC PANEL
Anion gap: 12 (ref 5–15)
BUN: 23 mg/dL — ABNORMAL HIGH (ref 6–20)
CO2: 24 mmol/L (ref 22–32)
Calcium: 9.5 mg/dL (ref 8.9–10.3)
Chloride: 103 mmol/L (ref 98–111)
Creatinine, Ser: 1.84 mg/dL — ABNORMAL HIGH (ref 0.61–1.24)
GFR, Estimated: 45 mL/min — ABNORMAL LOW (ref >60)
Glucose, Bld: 163 mg/dL — ABNORMAL HIGH (ref 70–99)
Potassium: 4.2 mmol/L (ref 3.5–5.1)
Sodium: 139 mmol/L (ref 135–145)

## 2020-08-08 LAB — CBC
HCT: 44.1 % (ref 39.0–52.0)
Hemoglobin: 14.2 g/dL (ref 13.0–17.0)
MCH: 26.5 pg (ref 26.0–34.0)
MCHC: 32.2 g/dL (ref 30.0–36.0)
MCV: 82.4 fL (ref 80.0–100.0)
Platelets: 292 10*3/uL (ref 150–400)
RBC: 5.35 MIL/uL (ref 4.22–5.81)
RDW: 14 % (ref 11.5–15.5)
WBC: 11.1 10*3/uL — ABNORMAL HIGH (ref 4.0–10.5)
nRBC: 0 % (ref 0.0–0.2)

## 2020-08-08 LAB — HEMOGLOBIN A1C
Hgb A1c MFr Bld: 7.4 % — ABNORMAL HIGH (ref 4.8–5.6)
Mean Plasma Glucose: 165.68 mg/dL

## 2020-08-08 LAB — GLUCOSE, CAPILLARY: Glucose-Capillary: 195 mg/dL — ABNORMAL HIGH (ref 70–99)

## 2020-08-08 LAB — PHOSPHORUS: Phosphorus: 3.1 mg/dL (ref 2.5–4.6)

## 2020-08-08 MED ORDER — NIFEDIPINE ER 60 MG PO TB24: 60.0000 mg | ORAL_TABLET | Freq: Every day | ORAL | Status: DC

## 2020-08-08 MED ORDER — NIFEDIPINE ER OSMOTIC RELEASE 30 MG PO TB24
30.0000 mg | ORAL_TABLET | Freq: Every day | ORAL | Status: DC
  Administered 2020-08-08: 30 mg via ORAL
  Filled 2020-08-08: qty 1

## 2020-08-08 MED ORDER — LORAZEPAM 2 MG/ML IJ SOLN
0.5000 mg | Freq: Once | INTRAMUSCULAR | Status: AC
  Administered 2020-08-08: 0.5 mg via INTRAVENOUS

## 2020-08-08 MED ORDER — ASPIRIN 81 MG PO CHEW: 81.0000 mg | CHEWABLE_TABLET | Freq: Every day | ORAL | 0 refills | Status: AC

## 2020-08-08 MED ORDER — ACETAMINOPHEN 325 MG PO TABS: 650.0000 mg | ORAL_TABLET | ORAL | Status: DC | PRN

## 2020-08-08 MED ORDER — APIXABAN 5 MG PO TABS: 5.0000 mg | ORAL_TABLET | Freq: Two times a day (BID) | ORAL | 11 refills | Status: DC

## 2020-08-08 MED ORDER — LORAZEPAM 2 MG/ML IJ SOLN
INTRAMUSCULAR | Status: AC
  Filled 2020-08-08: qty 1

## 2020-08-08 NOTE — Progress Notes (Signed)
SLP Cancellation Note  Patient Details Name: Randy [name] MRN: [id number] DOB: 07-Dec-1970   Cancelled treatment:       Reason Eval/Treat Not Completed: SLP screened, no needs identified, will sign off  Pt passed [employer] Screen and is consuming current diet without any reported issues.   Laveda Demedeiros B. Dreama Saa M.S., CCC-SLP, [facility] Speech-Language Pathologist Rehabilitation Services Office [phone]    Reuel Derby 08/08/2020, 10:27 AM

## 2020-08-08 NOTE — Hospital Course (Signed)
50 year old community dwelling BMI 43, HTN.male With prior hypertensive urgency Bell's palsy 2011 EtOH use, OSA Prior CVA 12/02/2019 (Rx [facility])-large acute frontal infarct-deficits = right lower extremity pain/speech impairment-at that time found to have new onset A. fib RVR CHADS2 score >4 discharged on ASA statin therapy with plan to transition to DOAC supposed to be on apixaban but stopped it 12/2019 could not afford the same  ED work-up = CT angio head neck moderate sized acute R PCA infarct AKI creatinine 1.5-->2.4 Stroke team saw the patient continue aspirin only at this time  Data BUN/creatinine 30/2.4-->23/1.8 LDL 38 HDL 39 VLDL 14 White count 11.1 hemoglobin 14  Acute R PCA infarct Acute kidney injury on admission A. fib CHADS2 score >4 noncompliant on anticoagulation Underlying hypertension and hypertensive glomerulosclerosis Prior EtOH use OSA BMI 43 Leukocytosis query cause

## 2020-08-08 NOTE — Evaluation (Signed)
Occupational Therapy Evaluation Patient Details Name: Randy [name] MRN: [id number] DOB: [birth year] Today's Date: 08/08/2020    History of Present Illness Pt is a 50 y/o M with PMH: HTN, stroke in Aug 2021, and Afib with RVR (prescribed Apixiban, but reportedly was not taking as he could not afford, pt reports to this author that [facility] is where he fills his scripts and was doing something incorrectly regarding his insurance as they were charging him nearly 500/month for this medication). Pt presented with vision changes that he reports have largely resovled at this time. Found to have right PCA distribution infarct on MRI.   Clinical Impression   Pt seen for OT evaluation this date in setting of acute hospitalization d/t CVA. Pt reports being completely INDEP at baseline including driving. Pt presents this date with mobility, sensation, strength and fine motor coordination in tact and at his self-reported baseline. Only concern noted on OT assessment is that patient presents with decreased L peripheral vision which was reported to RN, MD and neurologist via secure chat. OT educates pt re: safety concerns r/t his vision changes in relation to his IADLs including working. Encouraged f/u with neuro. Pt and spouse with good understanding. No further OT needs identified.    Follow Up Recommendations  No OT follow up    Equipment Recommendations  None recommended by OT    Recommendations for Other Services       Precautions / Restrictions Precautions Precautions: Fall Restrictions Weight Bearing Restrictions: No      Mobility Bed Mobility Overal bed mobility: Independent                  Transfers Overall transfer level: Independent                    Balance Overall balance assessment: Independent                                         ADL either performed or assessed with clinical judgement   ADL Overall ADL's : Independent                                              Vision Patient Visual Report: Peripheral vision impairment Vision Assessment?: Yes Eye Alignment: Within Functional Limits Ocular Range of Motion: Within Functional Limits Alignment/Gaze Preference: Within Defined Limits Tracking/Visual Pursuits: Left eye does not track laterally (unable to see stimulus in L periphery, notified attending, RN, and neurologist) Saccades: Within functional limits Convergence: Within functional limits     Perception     Praxis      Pertinent Vitals/Pain Pain Assessment: No/denies pain     Hand Dominance     Extremity/Trunk Assessment Upper Extremity Assessment Upper Extremity Assessment: Overall WFL for tasks assessed (ROM/MMT equal bilaterally, sensation equal bilaterally. FMC equal and in tact bilaterally.)   Lower Extremity Assessment Lower Extremity Assessment: Overall WFL for tasks assessed       Communication Communication Communication: No difficulties   Cognition Arousal/Alertness: Awake/alert Behavior During Therapy: WFL for tasks assessed/performed Overall Cognitive Status: Within Functional Limits for tasks assessed  General Comments       Exercises Other Exercises Other Exercises: OT facilitates ed re: role of OT in acute setting. Pt reports that his vision issues appear largely resolved at the start of assessment, but with OT vision assessment, it's noted that his left peripheral vision is decreased. OT notifies RN, attending and neurologist. Neuro had documented L hemianopsia on assessment.   Shoulder Instructions      Home Living Family/patient expects to be discharged to:: Private residence Living Arrangements: Spouse/significant other;Children (has a son) Available Help at Discharge: Family;Available PRN/intermittently (spouse works from home) Type of Home: House Home Access: Stairs to enter                      Home Equipment: None          Prior Functioning/Environment Level of Independence: Independent        Comments: INDEP including working and driving        OT Problem List: Impaired vision/perception      OT Treatment/Interventions: Self-care/ADL training;Therapeutic activities    OT Goals(Current goals can be found in the care plan section) Acute Rehab OT Goals Patient Stated Goal: to get out of the hospital and back to work/life OT Goal Formulation: All assessment and education complete, DC therapy  OT Frequency:     Barriers to D/C:            Co-evaluation              AM-PAC OT "6 Clicks" Daily Activity     Outcome Measure Help from another person eating meals?: None Help from another person taking care of personal grooming?: None Help from another person toileting, which includes using toliet, bedpan, or urinal?: None Help from another person bathing (including washing, rinsing, drying)?: None Help from another person to put on and taking off regular upper body clothing?: None Help from another person to put on and taking off regular lower body clothing?: None 6 Click Score: 24   End of Session Nurse Communication: Mobility status  Activity Tolerance: Patient tolerated treatment well Patient left: in chair;with family/visitor present  OT Visit Diagnosis: Other symptoms and signs involving the nervous system (O70.962)                Time: 8366-2947 OT Time Calculation (min): 41 min Charges:  OT General Charges $OT Visit: 1 Visit OT Evaluation $OT Eval Moderate Complexity: 1 Mod OT Treatments $Self Care/Home Management : 8-22 mins $Therapeutic Activity: 8-22 mins  Rejeana Brock, MS, OTR/L ascom [phone] 08/08/20, 11:03 AM

## 2020-08-08 NOTE — TOC Initial Note (Signed)
Transition of Care [facility]) - Initial/Assessment Note    Patient Details  Name: Randy [name] MRN: [id number] Date of Birth: 06/28/1970  Transition of Care [facility]) CM/SW Contact:    Shelbie Hutching, RN Phone Number: 08/08/2020, 10:31 AM  Clinical Narrative:                 Patient admitted to the hospital with stroke.  RNCM met with patient and patient's wife, Roland Rack, at the beside.  Patient is from home with wife, independent in ADL's and drives.  Patient will not be able to drive once discharged from the hospital due to loss of peripheral vision.  Patient's wife will be able to provide his transportation.  Patient is current with PCP at [facility].  Patient gets his prescriptions from [facility].  Patient has previously not been able to get his Eliquis filled due to cost but Pharmacist verified with [facility] that the prescription will be $45/month and patient reports that he can afford that.  Patient will discharge home today, patient's wife will provide transportation home.   Expected Discharge Plan: Home/Self Care Barriers to Discharge: No Barriers Identified   Patient Goals and CMS Choice Patient states their goals for this hospitalization and ongoing recovery are:: glad to be going home      Expected Discharge Plan and Services Expected Discharge Plan: Home/Self Care   Discharge Planning Services: CM Consult,Medication Assistance   Living arrangements for the past 2 months: Single Family Home Expected Discharge Date: 08/08/20               DME Arranged: N/A DME Agency: NA       HH Arranged: NA          Prior Living Arrangements/Services Living arrangements for the past 2 months: Single Family Home Lives with:: Spouse,Minor Children Patient language and need for interpreter reviewed:: Yes Do you feel safe going back to the place where you live?: Yes      Need for Family Participation in Patient Care: Yes (Comment) (stroke) Care giver support system in place?:  Yes (comment) (wife)   Criminal Activity/Legal Involvement Pertinent to Current Situation/Hospitalization: No - Comment as needed  Activities of Daily Living Home Assistive Devices/Equipment: Other (Comment) (none) ADL Screening (condition at time of admission) Patient's cognitive ability adequate to safely complete daily activities?: Yes Is the patient deaf or have difficulty hearing?: No Does the patient have difficulty seeing, even when wearing glasses/contacts?: No (leeft eye impaired vision) Does the patient have difficulty concentrating, remembering, or making decisions?: Yes (impaired vision in left eye) Patient able to express need for assistance with ADLs?: No Does the patient have difficulty dressing or bathing?: No Independently performs ADLs?: No Does the patient have difficulty walking or climbing stairs?: No Weakness of Legs: None Weakness of Arms/Hands: None  Permission Sought/Granted Permission sought to share information with : Case Manager,Family Supports Permission granted to share information with : Yes, Verbal Permission Granted  Share Information with NAME: Roland Rack     Permission granted to share info w Relationship: wife     Emotional Assessment Appearance:: Appears stated age Attitude/Demeanor/Rapport: Engaged Affect (typically observed): Accepting Orientation: : Oriented to Self,Oriented to Place,Oriented to  Time,Oriented to Situation Alcohol / Substance Use: Not Applicable Psych Involvement: No (comment)  Admission diagnosis:  Acute right PCA stroke ([place]) [I63.531] Acute CVA (cerebrovascular accident) [facility]) [I63.9] Patient Active Problem List   Diagnosis Date Noted  . Acute CVA (cerebrovascular accident) ([place]) 08/07/2020  . Acute upper respiratory  infection 11/27/2014  . Numbness of finger 09/19/2011  . Hilar adenopathy   . 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[place]) 97/91/5041  . Uncontrolled type 2 diabetes mellitus with microalbuminuria ([place]) 04/23/2010  . Renal  insufficiency 03/21/2008  . VISUAL ACUITY, DECREASED, LEFT EYE 03/19/2008  . BELL'S PALSY, LEFT 01/30/2008  . Morbid obesity ([place]) 01/20/2008  . Malignant hypertension with heart failure and stage 3 chronic kidney disease ([place]) 01/20/2008   PCP:  [place]:   [id number] [place] [id number]: [phone]: [phone] [place] [id number]: [phone]: [phone]    Social Determinants of Health (SDOH) Interventions    Readmission Risk Interventions No flowsheet data found.

## 2020-08-08 NOTE — Evaluation (Signed)
Physical Therapy Evaluation Patient Details Name: Randy [name] MRN: [id number] DOB: 02-22-1971 Today's Date: 08/08/2020   History of Present Illness  Pt is a 51 y/o M with PMH: HTN, stroke in Aug 2021, and Afib with RVR (prescribed Apixiban, but reportedly was not taking as he could not afford, pt reports to this author that [facility] is where he fills his scripts and was doing something incorrectly regarding his insurance as they were charging him nearly 500/month for this medication). Pt presented with vision changes that he reports have largely resovled at this time. Found to have right PCA distribution infarct on MRI.  Clinical Impression  Pt showed good safety and confidence with mobility/ambulation/steps.  He had functional and equal strength/coordination t/o and is at his normal baseline except for L visual field cut.  No PT needs and safe to return home from our perspective, will sign off.      Follow Up Recommendations No PT follow up    Equipment Recommendations  None recommended by PT    Recommendations for Other Services       Precautions / Restrictions Precautions Precautions: Fall Restrictions Weight Bearing Restrictions: No      Mobility  Bed Mobility Overal bed mobility: Independent                  Transfers Overall transfer level: Independent                  Ambulation/Gait Ambulation/Gait assistance: Modified independent (Device/Increase time) Gait Distance (Feet): 250 Feet Assistive device: None       General Gait Details: able to maintain consistent and confidence cadence at community appropriate speeds.  Showed ability to turn head to the L account for L side vision defecits  Stairs Stairs: Yes Stairs assistance: Independent Stair Management: One rail Right Number of Stairs: 14 General stair comments: easily, safely and confidently negotiates flight of steps with occasional UE use on rails  Wheelchair Mobility    Modified  Rankin (Stroke Patients Only)       Balance Overall balance assessment: Independent                                           Pertinent Vitals/Pain Pain Assessment: No/denies pain    Home Living Family/patient expects to be discharged to:: Private residence Living Arrangements: Spouse/significant other;Children Available Help at Discharge: Family;Available PRN/intermittently Type of Home: House Home Access: Stairs to enter     Home Layout: Bed/bath upstairs Home Equipment: None      Prior Function Level of Independence: Independent         Comments: INDEP including working and driving     Higher education careers adviser        Extremity/Trunk Assessment   Upper Extremity Assessment Upper Extremity Assessment: Overall WFL for tasks assessed (= bilaterally)    Lower Extremity Assessment Lower Extremity Assessment: Overall WFL for tasks assessed (= bilaterally)       Communication   Communication: No difficulties  Cognition Arousal/Alertness: Awake/alert Behavior During Therapy: WFL for tasks assessed/performed Overall Cognitive Status: Within Functional Limits for tasks assessed                                        General Comments General comments (skin integrity, edema, etc.): no initial or  evolving symptoms apart from L visual field cut    Exercises Other Exercises Other Exercises: OT facilitates ed re: role of OT in acute setting. Pt reports that his vision issues appear largely resolved at the start of assessment, but with OT vision assessment, it's noted that his left peripheral vision is decreased. OT notifies RN, attending and neurologist. Neuro had documented L hemianopsia on assessment.   Assessment/Plan    PT Assessment Patent does not need any further PT services  PT Problem List         PT Treatment Interventions      PT Goals (Current goals can be found in the Care Plan section)  Acute Rehab PT Goals Patient  Stated Goal: to get out of the hospital and back to work/life PT Goal Formulation: All assessment and education complete, DC therapy    Frequency     Barriers to discharge        Co-evaluation               AM-PAC PT "6 Clicks" Mobility  Outcome Measure Help needed turning from your back to your side while in a flat bed without using bedrails?: None Help needed moving from lying on your back to sitting on the side of a flat bed without using bedrails?: None Help needed moving to and from a bed to a chair (including a wheelchair)?: None Help needed standing up from a chair using your arms (e.g., wheelchair or bedside chair)?: None Help needed to walk in hospital room?: None Help needed climbing 3-5 steps with a railing? : None 6 Click Score: 24    End of Session   Activity Tolerance: Patient tolerated treatment well Patient left: in chair;with family/visitor present Nurse Communication: Mobility status PT Visit Diagnosis: Other symptoms and signs involving the nervous system (R29.898)    Time: 6568-1275 PT Time Calculation (min) (ACUTE ONLY): 13 min   Charges:   PT Evaluation $PT Eval Low Complexity: 1 Low          Malachi Pro, DPT 08/08/2020, 11:40 AM

## 2020-08-08 NOTE — Progress Notes (Signed)
NEUROLOGY CONSULTATION PROGRESS NOTE   Date of service: August 08, 2020 Patient Name: Ruairi Stutsman MRN:  [id number] DOB:  15-Aug-1970  Brief HPI  Sander Remedios is a 50 y.o. male with PMH significant for pAfibb who is not on [facility] due to inability to afford Apixaban, hx of DM2, headache, prior L frontal stroke with no residual symptoms who presents with acute onset L visual field deficit, unclear LKW but first noticed at 2200 on 08/06/20. 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 outsid the widnow for tPA, no thrombectomy because he has a distinct stroke in the R PCA territory with clear hypodensity in that territory with loss of gray white differentiation. This is likely a completed stroke and unfortunately he is not going to benefit from intervention at this time.   Interval Hx   No acut events overnight. Appreciate help from pharmacy team for working out issues with Eliquis. His outpatient pharmacy did not have current insruance information. The new cost for Eliquis will be $45 a month. Patient states that he can afford to pay that.  Vitals   Vitals:   08/08/20 0006 08/08/20 0511 08/08/20 0630 08/08/20 0753  BP: (!) 160/99  (!) 168/92 (!) 184/117  Pulse: 99 64  99  Resp: 20 17  17   Temp: 98.9 F (37.2 C) 98.4 F (36.9 C) 98.6 F (37 C) 97.6 F (36.4 C)  TempSrc: Oral  Oral   SpO2: 100% 100% 100% 100%  Weight:      Height:         Body mass index is 43.72 kg/m.  Physical Exam    Neurologic Examination  Mental status/Cognition: Alert, good attention.  Speech/language: Fluent, comprehension intact. Cranial nerves:   CN II Pupils equal and reactive to light, L hemianopsia.   CN III,IV,VI EOM intact, no gaze preference or deviation, no nystagmus   CN V    CN VII no asymmetry, no nasolabial fold flattening   CN VIII normal hearing to speech   CN IX & X    CN XI    CN XII    Motor:   Muscle bulk: normal  Moves all extremities spontaneously and antigravity. Walks around the room.  Coordination/Complex Motor:  - Finger to Nose intact BL  Labs   Basic Metabolic Panel:  Lab Results  Component Value Date   NA 139 08/08/2020   K 4.2 08/08/2020   CO2 24 08/08/2020   GLUCOSE 163 (H) 08/08/2020   BUN 23 (H) 08/08/2020   CREATININE 1.84 (H) 08/08/2020   CALCIUM 9.5 08/08/2020   GFRNONAA 45 (L) 08/08/2020   GFRAA >60 09/25/2015   HbA1c:  Lab Results  Component Value Date   HGBA1C 12.5 (H) 11/28/2014   LDL:  Lab Results  Component Value Date   LDLCALC 38 08/08/2020   Urine Drug Screen:     Component Value Date/Time   LABOPIA NONE DETECTED 03/12/2010 1222   COCAINSCRNUR NONE DETECTED 03/12/2010 1222   LABBENZ NONE DETECTED 03/12/2010 1222   AMPHETMU NONE DETECTED 03/12/2010 1222   THCU NONE DETECTED 03/12/2010 1222   LABBARB  03/12/2010 1222    NONE DETECTED        DRUG SCREEN FOR MEDICAL PURPOSES ONLY.  IF CONFIRMATION IS NEEDED FOR ANY PURPOSE, NOTIFY LAB WITHIN 5 DAYS.        LOWEST DETECTABLE LIMITS FOR URINE DRUG SCREEN Drug Class  Cutoff (ng/mL) Amphetamine      1000 Barbiturate      200 Benzodiazepine   200 Tricyclics       300 Opiates          300 Cocaine          300 THC              50    Alcohol Level No results found for: ETH No results found for: PHENYTOIN, ZONISAMIDE, LAMOTRIGINE, LEVETIRACETA No results found for: PHENYTOIN, PHENOBARB, VALPROATE, CBMZ  Imaging and Diagnostic studies   MR BRAIN WO CONTRAST IMPRESSION: 1. Moderate-sized acute right PCA distribution infarct. No associated hemorrhage or significant regional mass effect. 2. Chronic encephalomalacia at the anterior left frontal region, most likely related to a chronic left ACA distribution infarct. 3. Underlying moderate chronic microvascular ischemic disease.   Impression   Vikash Nest is a 50 y.o. male with PMH significant for pAfibb who is not on  AC due to inability to afford Apixaban, hx of DM2, headache, prior L frontal stroke with no residual symptoms who presents with acute onset L visual field deficit, unclear LKW but first noticed at 2200 on 08/06/20. 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 outsid the widnow for tPA, no thrombectomy because he has a distinct stroke in the R PCA territory with clear hypodensity in that territory with loss of gray white differentiation. This is likely a completed stroke and unfortunately he is not going to benefit from intervention at this time.  Were able to figure out the issue with insurance and cost for Eliquis will now be $45 a month which he is able to afford.  Recommendations  - No driving given L hemianopsia.(discussed with patient and his wife) - I discontinued TTE. He has known afibb and will need to take Eliquis. Getting a TTE here is unlikely to change management here. - Continue Aspirin 81mg  daily with last dose on 08/11/20, then stop. Start Eliquis 5mg  BID starting 08/12/20.(discussed with patient and his wife). - Follow up with optometry for Peripheral prism glasses. - Follow up with Dr. with [facility] about 3 weeks. - He will also benefit from eventual addition of baby aspirin along with eliquis, specifically for the noted severe basilar stenosis and the noted multifocal multivessel intracranial stenosis. Recommend getting a CTH without contrast about 2 weeks from discharge. If there is no ICH on the CT Head, then Aspirin 81mg  daily can be added to his regimen.  ______________________________________________________________________  Thank you for the opportunity to take part in the care of this patient. If you have any further questions, please contact the neurology consultation attending.  Signed,  Delia Heady [facility] Pager Number  [facility]

## 2020-08-08 NOTE — Discharge Summary (Addendum)
Physician Discharge Summary  Randy [name] ION:[id number] DOB: April 25, 1971 DOA: 08/07/2020  PCP: [facility]  Admit date: 08/07/2020 Discharge date: 08/08/2020  Time spent: 44 minutes  Recommendations for Outpatient Follow-up:  1. Asa until 4/17 and then stop--Take Eliquis subsequently--Rx sent to pharmacy 2. OP follow up wit Dr. Pearlean Brownie and a CT head in ~ 2 weeks--if CT head doesn't show bleed--then put on both ASA/Eliquis 3. Cut back dose Nifedipine until indicated 4. Needs OP Optho appt--Wife will make the appt 5. Needs neuro follow up 1 mo 6. CANNOT DRIVE UNTIL CLEARED BY NEURO/OPTHO  Discharge Diagnoses:  MAIN problem for hospitalization   Acute R PCA infarct Acute kidney injury on admission A. 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 fib RVR CHADS2 score >4 discharged on ASA statin therapy with plan to transition to DOAC supposed to be on apixaban but stopped it 12/2019 could not afford the same  ED work-up = CT angio head neck moderate sized acute R PCA infarct AKI creatinine 1.5-->2.4 Stroke team saw the patient continue aspirin until 4/17and then STOP  Pharmacist was able to help sort out Eliquis affordability issues, which he should resume on 4/18  Dr. Melina Fiddler had extensive discussions with  patient/family about NOT DRIVING given L sided peripheral field cut  He will follow up with Dr. Clelia Croft at [facility] for neurology care in the OP setting  Consultations:  neuro  Discharge Exam: Vitals:   08/08/20 0630 08/08/20 0753  BP: (!) 168/92 (!) 184/117  Pulse:  99  Resp:  17  Temp: 98.6 F (37 C) 97.6 F (36.4 C)  SpO2: 100% 100%    Subj on day of d/c   Jovial pleasant in nad   General Exam on discharge  Field cut L side to direct confrontation cta b no added sound In afib s1 s2 no M/R/G abd soft  Power 5/5 Reflexes deferred  Discharge Instructions   Discharge Instructions    Diet - low sodium heart healthy   Complete by: As directed    Discharge instructions   Complete by: As directed    Take aspirin until 4/17   Discharge instructions   Complete by: As directed    Look at your meds CAREFULLY-TAKE ASPIRIN TILL 4/17 THEN STOP Then resume elliquis Follow up with [facility] neurology in 1 month Please go see an eye doctor and YOU CANNOT DRIVE UNTIL YOU ARE SEEN BY OPTOMETRIST AND NEUROLOGIST   Increase activity slowly   Complete by: As directed    Increase activity slowly   Complete by: As directed      Allergies as of 08/08/2020      Reactions   Fish-derived Products Anaphylaxis      Medication List    STOP taking these medications   aluminum-magnesium hydroxide-simethicone 200-200-20 MG/5ML Susp Commonly known as: MAALOX   cloNIDine 0.2 MG tablet Commonly known as: CATAPRES  furosemide 20 MG tablet Commonly known as: LASIX   HYDROcodone-acetaminophen 5-325 MG tablet Commonly known as: NORCO/VICODIN   losartan 100 MG tablet Commonly known as: Cozaar   predniSONE 10 MG (21) Tbpk tablet Commonly known as: STERAPRED UNI-PAK 21 TAB     TAKE these medications   apixaban 5 MG Tabs tablet Commonly known as: ELIQUIS Take 1 tablet (5 mg total) by mouth 2 (two) times daily. Start taking on: August 12, 2020   aspirin 81 MG  chewable tablet Chew 1 tablet (81 mg total) by mouth daily for 4 days. What changed:   how much to take  when to take this   atorvastatin 20 MG tablet Commonly known as: LIPITOR Take 1 tablet by mouth at bedtime.   diltiazem 120 MG 24 hr capsule Commonly known as: CARDIZEM CD Take 120 mg by mouth daily.   famotidine 20 MG tablet Commonly known as: PEPCID Take 1 tablet (20 mg total) by mouth 2 (two) times daily.   glimepiride 2 MG tablet Commonly known as: AMARYL TAKE ONE TABLET BY MOUTH ONCE DAILY BEFORE BREAKFAST   hydrOXYzine 25 MG tablet Commonly known as: ATARAX/VISTARIL Take 1 tablet (25 mg total) by mouth every 6 (six) hours.   labetalol 300 MG tablet Commonly known as: NORMODYNE Take 1 tablet (300 mg total) by mouth 2 (two) times daily.   labetalol 100 MG tablet Commonly known as: NORMODYNE Take 100 mg by mouth 2 (two) times daily.   metFORMIN 500 MG tablet Commonly known as: GLUCOPHAGE Take 1 tablet by mouth daily.   metoprolol tartrate 50 MG tablet Commonly known as: LOPRESSOR Take 50 mg by mouth 2 (two) times daily.   NIFEdipine 60 MG 24 hr tablet Commonly known as: ADALAT CC Take 1 tablet (60 mg total) by mouth daily. Take a half tablet daily for 3 days and then go back to full strength What changed: additional instructions   Vitamin D (Ergocalciferol) 1.25 MG (50000 UNIT) Caps capsule Commonly known as: DRISDOL Take 1 capsule by mouth once a week.      Allergies  Allergen Reactions  . Fish-Derived Products Anaphylaxis      The results of significant diagnostics from this hospitalization (including imaging, microbiology, ancillary and laboratory) are listed below for reference.    Significant Diagnostic Studies: CT ANGIO HEAD NECK W WO CM  Result Date: 08/07/2020 CLINICAL DATA:  Left eye vision changes. Headache. History of diabetes and stroke. EXAM: CT ANGIOGRAPHY HEAD AND NECK TECHNIQUE: Multidetector CT imaging of the head and neck was  performed using the standard protocol during bolus administration of intravenous contrast. Multiplanar CT image reconstructions and MIPs were obtained to evaluate the vascular anatomy. Carotid stenosis measurements (when applicable) are obtained utilizing NASCET criteria, using the distal internal carotid diameter as the denominator. CONTRAST:  75mL OMNIPAQUE IOHEXOL 350 MG/ML SOLN COMPARISON:  Head CT and neck CTA 03/12/2010 FINDINGS: CT HEAD FINDINGS Brain: There is an acute moderate-sized right PCA infarct involving the right occipital and posterior temporal lobes. There is a moderate-sized chronic left frontal infarct in the ACA territory which is new from 2011 (acute in 11/2019 based on outside MRI report). Patchy hypodensities elsewhere in the cerebral white matter bilaterally have progressed from the 2011 CT and are nonspecific but compatible with moderately age advanced chronic small vessel ischemic disease. The ventricles are normal in size. No acute intracranial hemorrhage, mass, midline shift, or extra-axial fluid collection is identified. Vascular: Calcified atherosclerosis at the skull base. Skull: No fracture  or suspicious osseous lesion. Sinuses: Clear paranasal sinuses. Moderately large right and small left mastoid effusions. Orbits: Unremarkable. Review of the MIP images confirms the above findings CTA NECK FINDINGS Aortic arch: Standard 3 vessel aortic arch with widely patent arch vessel origins. Right carotid system: Patent with minimal calcified plaque at the carotid bifurcation. No evidence of dissection or stenosis. Left carotid system: Patent with minimal calcified plaque at the carotid bifurcation. No evidence of dissection or stenosis. Vertebral arteries: Patent and codominant without evidence of a significant stenosis or dissection although shoulder artifact limits assessment of the proximal V1 segments. Skeleton: No acute osseous abnormality or suspicious osseous lesion. Other neck:  Prominent number of small lymph nodes in the neck bilaterally with the largest measuring approximately 1 cm in short axis in level II, similar to the 2011 CTA and presumably benign. Chronic moderate diffuse enlargement of the thyroid gland without significant heterogeneity or a discrete nodule and for which no imaging follow-up is recommended. Upper chest: Clear lung apices. Review of the MIP images confirms the above findings CTA HEAD FINDINGS Contrast timing is suboptimal which limits assessment of the small and medium-sized arteries. Anterior circulation: The internal carotid arteries are patent from skull base to carotid termini with atherosclerotic plaque resulting in moderate left cavernous stenosis. ACAs and MCAs are patent proximally, however suboptimal contrast timing limits assessment of the branch vessels as well as for proximal stenosis. No aneurysm is identified. Posterior circulation: The intracranial vertebral arteries are patent to the basilar with minimal calcified plaque in the right V4 segment not resulting in a significant stenosis. The basilar artery is patent with a severe stenosis distally at the level of the SCA origins. Both PCAs are patent, and there is evidence of a moderate stenosis at the right P1 origin. No aneurysm is identified. Venous sinuses: Patent. Anatomic variants: None. Review of the MIP images confirms the above findings IMPRESSION: 1. Moderate-sized acute right PCA infarct. 2. Chronic left ACA infarct. 3. Moderately age advanced chronic small vessel ischemic disease. 4. No large vessel occlusion. 5. 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 6. Patent cervical carotid and vertebral arteries without evidence of a significant stenosis (noting poor assessment of the proximal V1 segments). Electronically Signed   By: Sebastian Ache M.D.   On: 08/07/2020 10:58   MR  BRAIN WO CONTRAST  Result Date: 08/08/2020 CLINICAL DATA:  Follow-up examination for acute stroke. EXAM: MRI HEAD WITHOUT CONTRAST TECHNIQUE: Multiplanar, multiecho pulse sequences of the brain and surrounding structures were obtained without intravenous contrast. COMPARISON:  Prior CTA from 08/07/20. FINDINGS: Brain: Cerebral volume within normal limits for age. Patchy T2/FLAIR hyperintensity within the periventricular and deep white matter both cerebral hemispheres most like related chronic microvascular ischemic disease, moderate in nature. Encephalomalacia and gliosis involving the anterior left frontal region most like related to a chronic left ACA distribution infarct. Associated chronic hemosiderin staining within this region. Extensive confluent restricted diffusion involving the right posterior temporal occipital region consistent with an acute right PCA distribution infarct, corresponding with abnormality on prior CT. No associated hemorrhage or mass effect. No other diffusion abnormality to suggest acute or subacute ischemia. Gray-white matter differentiation otherwise maintained. No evidence for acute or chronic intracranial hemorrhage elsewhere within the brain. No mass lesion, midline shift or mass effect. No hydrocephalus or extra-axial fluid collection. Pituitary gland suprasellar region within normal limits. Midline structures intact. Vascular: Major intracranial  vascular flow voids are maintained. Skull and upper cervical spine: Craniocervical junction within normal limits. Bone marrow signal intensity normal. No focal marrow replacing lesion. No scalp soft tissue abnormality. Sinuses/Orbits: Visualized globes and orbital soft tissues within normal limits. Scattered mucoperiosteal thickening noted throughout the ethmoidal air cells and maxillary sinuses. Visualized paranasal sinuses are otherwise largely clear. Right greater than left mastoid effusions noted. Other: None. IMPRESSION: 1.  Moderate-sized acute right PCA distribution infarct. No associated hemorrhage or significant regional mass effect. 2. Chronic encephalomalacia at the anterior left frontal region, most likely related to a chronic left ACA distribution infarct. 3. Underlying moderate chronic microvascular ischemic disease. Electronically Signed   By: Rise Mu M.D.   On: 08/08/2020 02:46    Microbiology: Recent Results (from the past 240 hour(s))  Resp Panel by RT-PCR (Flu A&B, Covid) Nasopharyngeal Swab     Status: None   Collection Time: 08/07/20  1:00 PM   Specimen: Nasopharyngeal Swab; Nasopharyngeal(NP) swabs in vial transport medium  Result Value Ref Range Status   SARS Coronavirus 2 by RT PCR NEGATIVE NEGATIVE Final    Comment: (NOTE) SARS-CoV-2 target nucleic acids are NOT DETECTED.  The SARS-CoV-2 RNA is generally detectable in upper respiratory specimens during the acute phase of infection. The lowest concentration of SARS-CoV-2 viral copies this assay can detect is 138 copies/mL. A negative result does not preclude SARS-Cov-2 infection and should not be used as the sole basis for treatment or other patient management decisions. A negative result may occur with  improper specimen collection/handling, submission of specimen other than nasopharyngeal swab, presence of viral mutation(s) within the areas targeted by this assay, and inadequate number of viral copies(<138 copies/mL). A negative result must be combined with clinical observations, patient history, and epidemiological information. The expected result is Negative.  Fact Sheet for Patients:  BloggerCourse.com  Fact Sheet for Healthcare Providers:  SeriousBroker.it  This test is no t yet approved or cleared by the [place] FDA and  has been authorized for detection and/or diagnosis of SARS-CoV-2 by FDA under an Emergency Use Authorization (EUA). This EUA will remain  in  effect (meaning this test can be used) for the duration of the COVID-19 declaration under Section 564(b)(1) of the Act, 21 U.S.C.section 360bbb-3(b)(1), unless the authorization is terminated  or revoked sooner.       Influenza A by PCR NEGATIVE NEGATIVE Final   Influenza B by PCR NEGATIVE NEGATIVE Final    Comment: (NOTE) The Xpert Xpress SARS-CoV-2/FLU/RSV plus assay is intended as an aid in the diagnosis of influenza from Nasopharyngeal swab specimens and should not be used as a sole basis for treatment. Nasal washings and aspirates are unacceptable for Xpert Xpress SARS-CoV-2/FLU/RSV testing.  Fact Sheet for Patients: BloggerCourse.com  Fact Sheet for Healthcare Providers: SeriousBroker.it  This test is not yet approved or cleared by the [place] FDA and has been authorized for detection and/or diagnosis of SARS-CoV-2 by FDA under an Emergency Use Authorization (EUA). This EUA will remain in effect (meaning this test can be used) for the duration of the COVID-19 declaration under Section 564(b)(1) of the Act, 21 U.S.C. section 360bbb-3(b)(1), unless the authorization is terminated or revoked.  Performed at [facility], [address] [place], [place] [address]      Labs: Basic Metabolic Panel: Recent Labs  Lab 08/07/20 0852 08/08/20 0447  NA 136 139  K 4.6 4.2  CL 102 103  CO2 23 24  GLUCOSE 242* 163*  BUN 30*  23*  CREATININE 2.43* 1.84*  CALCIUM 9.3 9.5  MG  --  1.7  PHOS  --  3.1   Liver Function Tests: No results for input(s): AST, ALT, ALKPHOS, BILITOT, PROT, ALBUMIN in the last 168 hours. No results for input(s): LIPASE, AMYLASE in the last 168 hours. No results for input(s): AMMONIA in the last 168 hours. CBC: Recent Labs  Lab 08/07/20 0852 08/08/20 0447  WBC 9.9 11.1*  NEUTROABS 5.2  --   HGB 13.8 14.2  HCT 42.6 44.1  MCV 82.4 82.4  PLT 303 292   Cardiac Enzymes: No  results for input(s): CKTOTAL, CKMB, CKMBINDEX, TROPONINI in the last 168 hours. BNP: BNP (last 3 results) No results for input(s): BNP in the last 8760 hours.  ProBNP (last 3 results) No results for input(s): PROBNP in the last 8760 hours.  CBG: Recent Labs  Lab 08/07/20 0847 08/07/20 1622 08/07/20 2318 08/08/20 0752  GLUCAP 233* 166* 122* 195*       Signed:  Rhetta Mura MD   [facility] 08/08/2020, 10:15 AM

## 2020-08-08 NOTE — TOC Benefit Eligibility Note (Signed)
Transition of Care [facility]) Benefit Eligibility Note    Patient Details  Name: Adalberto Metzgar MRN: [id number] Date of Birth: Jun 30, 1970   Medication/Dose: Eliquis 5 mg BID  Covered?: Yes     Prescription Coverage Preferred Pharmacy: [facility], [facility]  Spoke with Person/Company/Phone Number:: , [phone]  Co-Pay: $45        Additional Notes: can use the Manufacturer coupon for $10 co-pay    Verita Schneiders Akaila Rambo Phone Number: 08/08/2020, 11:19 AM

## 2020-08-12 ENCOUNTER — Encounter: Payer: Self-pay | Admitting: Family Medicine

## 2020-10-22 ENCOUNTER — Inpatient Hospital Stay: Payer: 59 | Admitting: Neurology

## 2020-10-22 ENCOUNTER — Encounter: Payer: Self-pay | Admitting: Neurology

## 2021-01-26 ENCOUNTER — Encounter (HOSPITAL_COMMUNITY): Payer: Self-pay | Admitting: *Deleted

## 2021-01-26 ENCOUNTER — Inpatient Hospital Stay (HOSPITAL_COMMUNITY)
Admission: EM | Admit: 2021-01-26 | Discharge: 2021-01-31 | DRG: 286 | Disposition: A | Discharge status: Home / Self Care | Payer: 59 | Attending: Internal Medicine | Admitting: Internal Medicine

## 2021-01-26 ENCOUNTER — Other Ambulatory Visit: Payer: Self-pay

## 2021-01-26 ENCOUNTER — Emergency Department (HOSPITAL_COMMUNITY): Payer: 59

## 2021-01-26 DIAGNOSIS — Z833 Family history of diabetes mellitus: Secondary | ICD-10-CM

## 2021-01-26 DIAGNOSIS — I4891 Unspecified atrial fibrillation: Secondary | ICD-10-CM | POA: Diagnosis not present

## 2021-01-26 DIAGNOSIS — E1122 Type 2 diabetes mellitus with diabetic chronic kidney disease: Secondary | ICD-10-CM | POA: Diagnosis present

## 2021-01-26 DIAGNOSIS — I5023 Acute on chronic systolic (congestive) heart failure: Secondary | ICD-10-CM | POA: Diagnosis present

## 2021-01-26 DIAGNOSIS — Z6841 Body Mass Index (BMI) 40.0 and over, adult: Secondary | ICD-10-CM

## 2021-01-26 DIAGNOSIS — K219 Gastro-esophageal reflux disease without esophagitis: Secondary | ICD-10-CM | POA: Diagnosis present

## 2021-01-26 DIAGNOSIS — E785 Hyperlipidemia, unspecified: Secondary | ICD-10-CM | POA: Diagnosis present

## 2021-01-26 DIAGNOSIS — G4733 Obstructive sleep apnea (adult) (pediatric): Secondary | ICD-10-CM | POA: Diagnosis present

## 2021-01-26 DIAGNOSIS — I10 Essential (primary) hypertension: Secondary | ICD-10-CM

## 2021-01-26 DIAGNOSIS — R6510 Systemic inflammatory response syndrome (SIRS) of non-infectious origin without acute organ dysfunction: Secondary | ICD-10-CM | POA: Diagnosis present

## 2021-01-26 DIAGNOSIS — Z20822 Contact with and (suspected) exposure to covid-19: Secondary | ICD-10-CM | POA: Diagnosis present

## 2021-01-26 DIAGNOSIS — Z7984 Long term (current) use of oral hypoglycemic drugs: Secondary | ICD-10-CM

## 2021-01-26 DIAGNOSIS — I130 Hypertensive heart and chronic kidney disease with heart failure and stage 1 through stage 4 chronic kidney disease, or unspecified chronic kidney disease: Secondary | ICD-10-CM | POA: Diagnosis not present

## 2021-01-26 DIAGNOSIS — I428 Other cardiomyopathies: Secondary | ICD-10-CM | POA: Diagnosis present

## 2021-01-26 DIAGNOSIS — Z8249 Family history of ischemic heart disease and other diseases of the circulatory system: Secondary | ICD-10-CM

## 2021-01-26 DIAGNOSIS — I5021 Acute systolic (congestive) heart failure: Secondary | ICD-10-CM | POA: Diagnosis present

## 2021-01-26 DIAGNOSIS — Z91013 Allergy to seafood: Secondary | ICD-10-CM

## 2021-01-26 DIAGNOSIS — I509 Heart failure, unspecified: Secondary | ICD-10-CM

## 2021-01-26 DIAGNOSIS — E876 Hypokalemia: Secondary | ICD-10-CM | POA: Diagnosis present

## 2021-01-26 DIAGNOSIS — Z7982 Long term (current) use of aspirin: Secondary | ICD-10-CM

## 2021-01-26 DIAGNOSIS — Z8673 Personal history of transient ischemic attack (TIA), and cerebral infarction without residual deficits: Secondary | ICD-10-CM

## 2021-01-26 DIAGNOSIS — Z7901 Long term (current) use of anticoagulants: Secondary | ICD-10-CM

## 2021-01-26 DIAGNOSIS — Z79899 Other long term (current) drug therapy: Secondary | ICD-10-CM

## 2021-01-26 DIAGNOSIS — D631 Anemia in chronic kidney disease: Secondary | ICD-10-CM | POA: Diagnosis present

## 2021-01-26 DIAGNOSIS — Z23 Encounter for immunization: Secondary | ICD-10-CM

## 2021-01-26 DIAGNOSIS — I248 Other forms of acute ischemic heart disease: Secondary | ICD-10-CM | POA: Diagnosis present

## 2021-01-26 DIAGNOSIS — I2510 Atherosclerotic heart disease of native coronary artery without angina pectoris: Secondary | ICD-10-CM | POA: Diagnosis present

## 2021-01-26 DIAGNOSIS — N1832 Chronic kidney disease, stage 3b: Secondary | ICD-10-CM | POA: Diagnosis present

## 2021-01-26 DIAGNOSIS — E871 Hypo-osmolality and hyponatremia: Secondary | ICD-10-CM | POA: Diagnosis present

## 2021-01-26 DIAGNOSIS — I4821 Permanent atrial fibrillation: Secondary | ICD-10-CM | POA: Diagnosis present

## 2021-01-26 DIAGNOSIS — E875 Hyperkalemia: Secondary | ICD-10-CM | POA: Diagnosis not present

## 2021-01-26 DIAGNOSIS — D509 Iron deficiency anemia, unspecified: Secondary | ICD-10-CM | POA: Diagnosis present

## 2021-01-26 LAB — CBC
HCT: 37.6 % — ABNORMAL LOW (ref 39.0–52.0)
Hemoglobin: 12.1 g/dL — ABNORMAL LOW (ref 13.0–17.0)
MCH: 25.9 pg — ABNORMAL LOW (ref 26.0–34.0)
MCHC: 32.2 g/dL (ref 30.0–36.0)
MCV: 80.5 fL (ref 80.0–100.0)
Platelets: 330 10*3/uL (ref 150–400)
RBC: 4.67 MIL/uL (ref 4.22–5.81)
RDW: 14.9 % (ref 11.5–15.5)
WBC: 13.6 10*3/uL — ABNORMAL HIGH (ref 4.0–10.5)
nRBC: 0 % (ref 0.0–0.2)

## 2021-01-26 LAB — RESP PANEL BY RT-PCR (FLU A&B, COVID) ARPGX2
Influenza A by PCR: NEGATIVE
Influenza B by PCR: NEGATIVE
SARS Coronavirus 2 by RT PCR: NEGATIVE

## 2021-01-26 LAB — MAGNESIUM: Magnesium: 1.8 mg/dL (ref 1.7–2.4)

## 2021-01-26 LAB — TROPONIN I (HIGH SENSITIVITY)
Troponin I (High Sensitivity): 43 ng/L — ABNORMAL HIGH (ref <18)
Troponin I (High Sensitivity): 45 ng/L — ABNORMAL HIGH (ref <18)

## 2021-01-26 LAB — BASIC METABOLIC PANEL
Anion gap: 11 (ref 5–15)
BUN: 17 mg/dL (ref 6–20)
CO2: 21 mmol/L — ABNORMAL LOW (ref 22–32)
Calcium: 8.8 mg/dL — ABNORMAL LOW (ref 8.9–10.3)
Chloride: 99 mmol/L (ref 98–111)
Creatinine, Ser: 1.81 mg/dL — ABNORMAL HIGH (ref 0.61–1.24)
GFR, Estimated: 46 mL/min — ABNORMAL LOW (ref >60)
Glucose, Bld: 172 mg/dL — ABNORMAL HIGH (ref 70–99)
Potassium: 3.4 mmol/L — ABNORMAL LOW (ref 3.5–5.1)
Sodium: 131 mmol/L — ABNORMAL LOW (ref 135–145)

## 2021-01-26 LAB — BRAIN NATRIURETIC PEPTIDE: B Natriuretic Peptide: 318.4 pg/mL — ABNORMAL HIGH (ref 0.0–100.0)

## 2021-01-26 MED ORDER — DILTIAZEM HCL 25 MG/5ML IV SOLN
20.0000 mg | Freq: Once | INTRAVENOUS | Status: AC
  Administered 2021-01-26: 20 mg via INTRAVENOUS
  Filled 2021-01-26: qty 5

## 2021-01-26 MED ORDER — FUROSEMIDE 10 MG/ML IJ SOLN
20.0000 mg | Freq: Once | INTRAMUSCULAR | Status: AC
  Administered 2021-01-26: 20 mg via INTRAVENOUS
  Filled 2021-01-26: qty 2

## 2021-01-26 MED ORDER — LACTATED RINGERS IV BOLUS
1000.0000 mL | Freq: Once | INTRAVENOUS | Status: AC
  Administered 2021-01-26: 1000 mL via INTRAVENOUS

## 2021-01-26 NOTE — ED Triage Notes (Signed)
Pt was sitting watching  around 1700  he began to have difficulty breathing  no pain   pulse a little faster  he takes eloquist  for af

## 2021-01-26 NOTE — ED Provider Notes (Signed)
[facility] DEPARTMENT Provider Note   CSN: [id number] Arrival date & time: 01/26/21  1857     History Chief Complaint  Patient presents with   Shortness of Breath    Randy [name] is a 50 y.o. male with history of persistent A. fib anticoagulated on Eliquis as well as hypertensive cardiomyopathy who presents with sudden onset shortness of breath this evening around 5 PM.  States that he had a mild cough and intermittent episodes of mild shortness of breath since last night, however suddenly became severe shortness of breath without chest pain or palpitations.  I personally reads patient medical records.  In addition to his CHF and A. fib he additionally has history of hypertension, morbid obesity, and CVA 1 year ago.  HPI     Past Medical History:  Diagnosis Date   CKD (chronic kidney disease) stage 3, GFR 30-59 ml/min (HCC)    baseline Cr 1.5-1.7   Decreased visual acuity    Left eye, resolved - hypertensive retinopathy   Diabetes mellitus 2012   dx hospitalization with A1c 6.6%   Hilar adenopathy    on CT scan 02/2010, on rpt scan stable/improved.   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[place])    echo 2011 with nonischemic hypertensive cardiomyopathy   Systolic murmur    Vitamin D deficiency     Patient Active Problem List   Diagnosis Date Noted   New onset of congestive heart failure ([place]) 01/26/2021   Acute CVA (cerebrovascular accident) ([place]) 08/07/2020   Acute upper respiratory infection 11/27/2014   Numbness of finger 09/19/2011   Hilar adenopathy    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[place]) 08/65/7846   Uncontrolled type 2 diabetes mellitus with microalbuminuria 04/23/2010   Renal insufficiency 03/21/2008   VISUAL ACUITY,  DECREASED, LEFT EYE 03/19/2008   BELL'S PALSY, LEFT 01/30/2008   Morbid obesity ([place]) 01/20/2008   Malignant hypertension with heart failure and stage 3 chronic kidney disease ([place]) 01/20/2008    Past Surgical History:  Procedure Laterality Date   hospitalization  11/2008   malignant HTN, nl SPEP/UPEP, neg ANCA panel, nl C3/4, neg anti GBM Ab, neg Hep A/B/C, nl renal US, nl PTH, neg HIV   US ECHOCARDIOGRAPHY  11/2008   LVsys fxn EF 50%, mild MR, normal LV size, neg ANA, neg cryoglobulins   US ECHOCARDIOGRAPHY  2011   severe LVH, EF 40%, LA mildly dilated, PA pressure moderately increased       Family History  Problem Relation Age of Onset   Hypertension Mother    Hypertension Father    Diabetes Father    Hypertension Brother    Diabetes Sister    Coronary artery disease Neg Hx    Stroke Neg Hx    Cancer Neg Hx    Kidney disease Paternal Grandmother        ESRD    Social History   Tobacco Use   Smoking status: Never   Smokeless tobacco: Never   Tobacco comments:    monthly, when going out  Substance Use Topics   Alcohol use: Yes    Comment: Occasional   Drug use: No    Home Medications Prior to  Admission medications   Medication Sig Start Date End Date Taking? Authorizing Provider  apixaban (ELIQUIS) 5 MG TABS tablet Take 1 tablet (5 mg total) by mouth 2 (two) times daily. 08/12/20 08/07/21  Nita Sells, MD  atorvastatin (LIPITOR) 20 MG tablet Take 1 tablet by mouth at bedtime. 07/12/20   [provider]  diltiazem (CARDIZEM CD) 120 MG 24 hr capsule Take 120 mg by mouth daily. 07/11/20   [provider]  famotidine (PEPCID) 20 MG tablet Take 1 tablet (20 mg total) by mouth 2 (two) times daily. Patient not taking: No sig reported 09/25/15   Carrie Mew, MD  glimepiride (AMARYL) 2 MG tablet TAKE ONE TABLET BY MOUTH ONCE DAILY BEFORE BREAKFAST 01/29/16   Ria Bush, MD  hydrOXYzine (ATARAX/VISTARIL) 25 MG tablet Take 1 tablet (25 mg  total) by mouth every 6 (six) hours. Patient not taking: No sig reported 07/29/15   Patel-Mills, Orvil Feil, PA-C  labetalol (NORMODYNE) 100 MG tablet Take 100 mg by mouth 2 (two) times daily. 07/12/20   [provider]  labetalol (NORMODYNE) 300 MG tablet Take 1 tablet (300 mg total) by mouth 2 (two) times daily. 01/29/16   Ria Bush, MD  metFORMIN (GLUCOPHAGE) 500 MG tablet Take 1 tablet by mouth daily. 02/29/20   [provider]  metoprolol tartrate (LOPRESSOR) 50 MG tablet Take 50 mg by mouth 2 (two) times daily. 07/11/20   [provider]  NIFEdipine (ADALAT CC) 60 MG 24 hr tablet Take 1 tablet (60 mg total) by mouth daily. Take a half tablet daily for 3 days and then go back to full strength 08/08/20   Nita Sells, MD  Vitamin D, Ergocalciferol, (DRISDOL) 1.25 MG (50000 UNIT) CAPS capsule Take 1 capsule by mouth once a week. 07/25/20   [provider]    Allergies    Fish-derived products  Review of Systems   Review of Systems  Constitutional: Negative.   HENT: Negative.    Eyes: Negative.   Respiratory:  Positive for cough and shortness of breath.   Cardiovascular: Negative.   Gastrointestinal: Negative.   Genitourinary: Negative.   Musculoskeletal: Negative.   Neurological: Negative.    Physical Exam Updated Vital Signs BP (!) 137/101   Pulse 87   Temp 100.2 F (37.9 C) (Oral)   Resp (!) 23   Ht 6\' 8"  (2.032 m)   Wt (!) 180.5 kg   SpO2 97%   BMI 43.71 kg/m   Physical Exam Vitals and nursing note reviewed.  Constitutional:      Appearance: He is morbidly obese. He is not ill-appearing or toxic-appearing.  HENT:     Head: Normocephalic and atraumatic.     Nose: Nose normal.     Mouth/Throat:     Mouth: Mucous membranes are moist.     Pharynx: Oropharynx is clear. Uvula midline. No oropharyngeal exudate or posterior oropharyngeal erythema.     Tonsils: No tonsillar exudate.  Eyes:     General: Lids are normal. Vision  grossly intact.        Right eye: No discharge.        Left eye: No discharge.     Extraocular Movements: Extraocular movements intact.     Conjunctiva/sclera: Conjunctivae normal.     Pupils: Pupils are equal, round, and reactive to light.  Neck:     Trachea: Trachea and phonation normal.  Cardiovascular:     Rate and Rhythm: Tachycardia present. Rhythm irregularly irregular.     Pulses: Normal pulses.  Heart sounds: Normal heart sounds. No murmur heard. Pulmonary:     Effort: Pulmonary effort is normal. Tachypnea present. No bradypnea, accessory muscle usage, prolonged expiration or respiratory distress.     Breath sounds: Normal breath sounds. No wheezing or rales.  Chest:     Chest wall: No mass, lacerations, deformity, swelling, tenderness, crepitus or edema.  Abdominal:     General: Bowel sounds are normal. There is no distension.     Palpations: Abdomen is soft.     Tenderness: There is no abdominal tenderness. There is no right CVA tenderness, left CVA tenderness, guarding or rebound.  Musculoskeletal:        General: No deformity.     Cervical back: Normal range of motion and neck supple. No edema, rigidity or crepitus. No pain with movement, spinous process tenderness or muscular tenderness.     Right lower leg: No edema.     Left lower leg: No edema.  Lymphadenopathy:     Cervical: No cervical adenopathy.  Skin:    General: Skin is warm and dry.     Capillary Refill: Capillary refill takes less than 2 seconds.  Neurological:     General: No focal deficit present.     Mental Status: He is alert and oriented to person, place, and time. Mental status is at baseline.     Gait: Gait is intact. Gait normal.  Psychiatric:        Mood and Affect: Mood normal.    ED Results / Procedures / Treatments   Labs (all labs ordered are listed, but only abnormal results are displayed) Labs Reviewed  BASIC METABOLIC PANEL - Abnormal; Notable for the following components:       Result Value   Sodium 131 (*)    Potassium 3.4 (*)    CO2 21 (*)    Glucose, Bld 172 (*)    Creatinine, Ser 1.81 (*)    Calcium 8.8 (*)    GFR, Estimated 46 (*)    All other components within normal limits  CBC - Abnormal; Notable for the following components:   WBC 13.6 (*)    Hemoglobin 12.1 (*)    HCT 37.6 (*)    MCH 25.9 (*)    All other components within normal limits  BRAIN NATRIURETIC PEPTIDE - Abnormal; Notable for the following components:   B Natriuretic Peptide 318.4 (*)    All other components within normal limits  TROPONIN I (HIGH SENSITIVITY) - Abnormal; Notable for the following components:   Troponin I (High Sensitivity) 43 (*)    All other components within normal limits  TROPONIN I (HIGH SENSITIVITY) - Abnormal; Notable for the following components:   Troponin I (High Sensitivity) 45 (*)    All other components within normal limits  RESP PANEL BY RT-PCR (FLU A&B, COVID) ARPGX2  MAGNESIUM    EKG EKG Interpretation  Date/Time:  Sunday January 26 2021 19:10:46 EDT Ventricular Rate:  103 PR Interval:    QRS Duration: 96 QT Interval:  370 QTC Calculation: 484 R Axis:   74 Text Interpretation: Atrial fibrillation with rapid ventricular response Confirmed by Lajean Saver [phone]) on 01/26/2021 8:07:06 PM  Radiology DG Chest 2 View  Result Date: 01/26/2021 CLINICAL DATA:  Sudden onset shortness of breath. EXAM: CHEST - 2 VIEW COMPARISON:  09/25/2015 FINDINGS: Heart is enlarged. There is pulmonary vascular congestion and mild, diffuse interstitial opacities primarily at the bases. Small bilateral pleural effusions are noted. No focal opacities. IMPRESSION: Findings consistent with  pulmonary edema. Electronically Signed   By: Norva Pavlov M.D.   On: 01/26/2021 19:47    Procedures Procedures   Medications Ordered in ED Medications  lactated ringers bolus 1,000 mL (0 mLs Intravenous Stopped 01/26/21 2218)  diltiazem (CARDIZEM) injection 20 mg (20 mg  Intravenous Given 01/26/21 2015)  furosemide (LASIX) injection 20 mg (20 mg Intravenous Given 01/26/21 2215)    ED Course  I have reviewed the triage vital signs and the nursing notes.  Pertinent labs & imaging results that were available during my care of the patient were reviewed by me and considered in my medical decision making (see chart for details).  Clinical Course as of 01/26/21 2326  Wynelle Link Jan 26, 2021  2321 Consult to admitting provider, Dr. Loney Loh, who is agreeable to seeing the patient and admitting him to her service. I appreciate her collaboration in the care of this patient.  [RS]    Clinical Course User Index [RS] Maanya Hippert, Idelia Salm   MDM Rules/Calculators/A&P                         50 year old male who presents with concern for shortness of breath.  Tachycardic, per tensive, tachypneic on intake.  Borderline febrile to 100.2 F.  Cardiopulmonary exam revealed irregularly irregular rhythm with tachycardic heart rate; lungs CTAB exam complicated by body habitus.  Abdominal exam is benign.  Patient is neurovascular intact in all 4 extremities without significant lower extremity edema.  EKG with A. fib with RVR.  Chest x-ray with pulmonary edema and small pleural effusions bilaterally.  CBC with mild leukocytosis of 13.6, BMP with creatinine of 1.8 at patient's baseline.  Troponin elevated to 43.  BNP elevated to 318.  Magnesium is normal.  Respiratory pathogen panel negative.  Delta troponin pending.  Patient reevaluated after administration of Cardizem bolus with significant improvement in his heart rate to the 90s.  Shortness of breath improved as well. Pending delta troponin.  Does troponin very mildly elevated to 45.  Patient reevaluated with significant improvement in symptomatology, however given context of A. fib with RVR, pleural effusions and pulmonary edema that is new for the patient, and elevated troponins, feels patient would benefit from admission to the  hospital for observation and optimization of his outpatient cardiac medications.  Disposition plan discussed with the patient who voiced understanding of his medical evaluation and treatment plan.  Each of his questions answered to his expressed satisfaction.  He is amenable for plan for admission at this time.  Consult to Dr. Loney Loh as above; patient admitted to her service.  This chart was dictated using voice recognition software, Dragon. Despite the best efforts of this provider to proofread and correct errors, errors may still occur which can change documentation meaning.  Final Clinical Impression(s) / ED Diagnoses Final diagnoses:  None    Rx / DC Orders ED Discharge Orders     None        Sherrilee Gilles 01/26/21 2326    Cathren Laine, MD 01/26/21 2337

## 2021-01-27 ENCOUNTER — Encounter (HOSPITAL_COMMUNITY): Payer: Self-pay | Admitting: Internal Medicine

## 2021-01-27 ENCOUNTER — Observation Stay (HOSPITAL_COMMUNITY): Payer: 59

## 2021-01-27 DIAGNOSIS — Z91013 Allergy to seafood: Secondary | ICD-10-CM | POA: Diagnosis not present

## 2021-01-27 DIAGNOSIS — D509 Iron deficiency anemia, unspecified: Secondary | ICD-10-CM | POA: Diagnosis present

## 2021-01-27 DIAGNOSIS — E876 Hypokalemia: Secondary | ICD-10-CM | POA: Diagnosis present

## 2021-01-27 DIAGNOSIS — I5021 Acute systolic (congestive) heart failure: Secondary | ICD-10-CM | POA: Diagnosis present

## 2021-01-27 DIAGNOSIS — I10 Essential (primary) hypertension: Secondary | ICD-10-CM | POA: Diagnosis not present

## 2021-01-27 DIAGNOSIS — I4891 Unspecified atrial fibrillation: Secondary | ICD-10-CM | POA: Diagnosis present

## 2021-01-27 DIAGNOSIS — R6510 Systemic inflammatory response syndrome (SIRS) of non-infectious origin without acute organ dysfunction: Secondary | ICD-10-CM | POA: Diagnosis present

## 2021-01-27 DIAGNOSIS — E871 Hypo-osmolality and hyponatremia: Secondary | ICD-10-CM | POA: Diagnosis present

## 2021-01-27 DIAGNOSIS — Z23 Encounter for immunization: Secondary | ICD-10-CM | POA: Diagnosis not present

## 2021-01-27 DIAGNOSIS — I130 Hypertensive heart and chronic kidney disease with heart failure and stage 1 through stage 4 chronic kidney disease, or unspecified chronic kidney disease: Secondary | ICD-10-CM | POA: Diagnosis present

## 2021-01-27 DIAGNOSIS — Z6841 Body Mass Index (BMI) 40.0 and over, adult: Secondary | ICD-10-CM | POA: Diagnosis not present

## 2021-01-27 DIAGNOSIS — I509 Heart failure, unspecified: Secondary | ICD-10-CM

## 2021-01-27 DIAGNOSIS — I361 Nonrheumatic tricuspid (valve) insufficiency: Secondary | ICD-10-CM | POA: Diagnosis not present

## 2021-01-27 DIAGNOSIS — K219 Gastro-esophageal reflux disease without esophagitis: Secondary | ICD-10-CM | POA: Diagnosis present

## 2021-01-27 DIAGNOSIS — E875 Hyperkalemia: Secondary | ICD-10-CM | POA: Diagnosis not present

## 2021-01-27 DIAGNOSIS — I4821 Permanent atrial fibrillation: Secondary | ICD-10-CM

## 2021-01-27 DIAGNOSIS — N1832 Chronic kidney disease, stage 3b: Secondary | ICD-10-CM | POA: Diagnosis present

## 2021-01-27 DIAGNOSIS — I428 Other cardiomyopathies: Secondary | ICD-10-CM | POA: Diagnosis present

## 2021-01-27 DIAGNOSIS — Z8249 Family history of ischemic heart disease and other diseases of the circulatory system: Secondary | ICD-10-CM | POA: Diagnosis not present

## 2021-01-27 DIAGNOSIS — I110 Hypertensive heart disease with heart failure: Secondary | ICD-10-CM | POA: Diagnosis not present

## 2021-01-27 DIAGNOSIS — E785 Hyperlipidemia, unspecified: Secondary | ICD-10-CM | POA: Diagnosis present

## 2021-01-27 DIAGNOSIS — I248 Other forms of acute ischemic heart disease: Secondary | ICD-10-CM | POA: Diagnosis present

## 2021-01-27 DIAGNOSIS — G4733 Obstructive sleep apnea (adult) (pediatric): Secondary | ICD-10-CM | POA: Diagnosis present

## 2021-01-27 DIAGNOSIS — I2510 Atherosclerotic heart disease of native coronary artery without angina pectoris: Secondary | ICD-10-CM | POA: Diagnosis present

## 2021-01-27 DIAGNOSIS — E1122 Type 2 diabetes mellitus with diabetic chronic kidney disease: Secondary | ICD-10-CM | POA: Diagnosis present

## 2021-01-27 DIAGNOSIS — D631 Anemia in chronic kidney disease: Secondary | ICD-10-CM | POA: Diagnosis present

## 2021-01-27 DIAGNOSIS — I5023 Acute on chronic systolic (congestive) heart failure: Secondary | ICD-10-CM | POA: Diagnosis present

## 2021-01-27 DIAGNOSIS — Z20822 Contact with and (suspected) exposure to covid-19: Secondary | ICD-10-CM | POA: Diagnosis present

## 2021-01-27 DIAGNOSIS — Z8673 Personal history of transient ischemic attack (TIA), and cerebral infarction without residual deficits: Secondary | ICD-10-CM | POA: Diagnosis not present

## 2021-01-27 LAB — VITAMIN B12: Vitamin B-12: 775 pg/mL (ref 180–914)

## 2021-01-27 LAB — CBG MONITORING, ED
Glucose-Capillary: 139 mg/dL — ABNORMAL HIGH (ref 70–99)
Glucose-Capillary: 149 mg/dL — ABNORMAL HIGH (ref 70–99)

## 2021-01-27 LAB — URINALYSIS, COMPLETE (UACMP) WITH MICROSCOPIC
Bilirubin Urine: NEGATIVE
Glucose, UA: NEGATIVE mg/dL
Ketones, ur: NEGATIVE mg/dL
Leukocytes,Ua: NEGATIVE
Nitrite: NEGATIVE
Protein, ur: >=300 mg/dL — AB
Specific Gravity, Urine: 1.022 (ref 1.005–1.030)
pH: 5 (ref 5.0–8.0)

## 2021-01-27 LAB — CBC
HCT: 34.8 % — ABNORMAL LOW (ref 39.0–52.0)
Hemoglobin: 11.1 g/dL — ABNORMAL LOW (ref 13.0–17.0)
MCH: 25.7 pg — ABNORMAL LOW (ref 26.0–34.0)
MCHC: 31.9 g/dL (ref 30.0–36.0)
MCV: 80.6 fL (ref 80.0–100.0)
Platelets: 306 10*3/uL (ref 150–400)
RBC: 4.32 MIL/uL (ref 4.22–5.81)
RDW: 15 % (ref 11.5–15.5)
WBC: 14.2 10*3/uL — ABNORMAL HIGH (ref 4.0–10.5)
nRBC: 0 % (ref 0.0–0.2)

## 2021-01-27 LAB — RETICULOCYTES
Immature Retic Fract: 14.4 % (ref 2.3–15.9)
RBC.: 4.34 MIL/uL (ref 4.22–5.81)
Retic Count, Absolute: 47.3 10*3/uL (ref 19.0–186.0)
Retic Ct Pct: 1.1 % (ref 0.4–3.1)

## 2021-01-27 LAB — LACTIC ACID, PLASMA: Lactic Acid, Venous: 1.2 mmol/L (ref 0.5–1.9)

## 2021-01-27 LAB — GLUCOSE, CAPILLARY
Glucose-Capillary: 119 mg/dL — ABNORMAL HIGH (ref 70–99)
Glucose-Capillary: 144 mg/dL — ABNORMAL HIGH (ref 70–99)

## 2021-01-27 LAB — HEMOGLOBIN A1C
Hgb A1c MFr Bld: 6.3 % — ABNORMAL HIGH (ref 4.8–5.6)
Mean Plasma Glucose: 134.11 mg/dL

## 2021-01-27 LAB — BASIC METABOLIC PANEL
Anion gap: 10 (ref 5–15)
BUN: 21 mg/dL — ABNORMAL HIGH (ref 6–20)
CO2: 21 mmol/L — ABNORMAL LOW (ref 22–32)
Calcium: 8.5 mg/dL — ABNORMAL LOW (ref 8.9–10.3)
Chloride: 100 mmol/L (ref 98–111)
Creatinine, Ser: 2.31 mg/dL — ABNORMAL HIGH (ref 0.61–1.24)
GFR, Estimated: 34 mL/min — ABNORMAL LOW (ref >60)
Glucose, Bld: 201 mg/dL — ABNORMAL HIGH (ref 70–99)
Potassium: 3.6 mmol/L (ref 3.5–5.1)
Sodium: 131 mmol/L — ABNORMAL LOW (ref 135–145)

## 2021-01-27 LAB — IRON AND TIBC
Iron: 14 ug/dL — ABNORMAL LOW (ref 45–182)
Saturation Ratios: 6 % — ABNORMAL LOW (ref 17.9–39.5)
TIBC: 220 ug/dL — ABNORMAL LOW (ref 250–450)
UIBC: 206 ug/dL

## 2021-01-27 LAB — HIV ANTIBODY (ROUTINE TESTING W REFLEX): HIV Screen 4th Generation wRfx: NONREACTIVE

## 2021-01-27 LAB — ECHOCARDIOGRAM COMPLETE
Height: 80 in
S' Lateral: 5 cm
Weight: 6366.88 oz

## 2021-01-27 LAB — FERRITIN: Ferritin: 602 ng/mL — ABNORMAL HIGH (ref 24–336)

## 2021-01-27 LAB — FOLATE: Folate: 13.8 ng/mL (ref >5.9)

## 2021-01-27 LAB — PROCALCITONIN: Procalcitonin: 1.43 ng/mL

## 2021-01-27 MED ORDER — INSULIN ASPART 100 UNIT/ML IJ SOLN
0.0000 [IU] | Freq: Every day | INTRAMUSCULAR | Status: DC
Start: 2021-01-27 — End: 2021-02-01
  Administered 2021-01-28: 3 [IU] via SUBCUTANEOUS

## 2021-01-27 MED ORDER — FUROSEMIDE 10 MG/ML IJ SOLN
20.0000 mg | Freq: Once | INTRAMUSCULAR | Status: AC
  Administered 2021-01-27: 20 mg via INTRAVENOUS
  Filled 2021-01-27: qty 2

## 2021-01-27 MED ORDER — FUROSEMIDE 10 MG/ML IJ SOLN: 20.0000 mg | Freq: Two times a day (BID) | INTRAMUSCULAR | Status: DC

## 2021-01-27 MED ORDER — INSULIN ASPART 100 UNIT/ML IJ SOLN
0.0000 [IU] | Freq: Three times a day (TID) | INTRAMUSCULAR | Status: DC
Start: 2021-01-27 — End: 2021-02-01
  Administered 2021-01-27 – 2021-01-28 (×4): 1 [IU] via SUBCUTANEOUS
  Administered 2021-01-28 – 2021-01-29 (×3): 2 [IU] via SUBCUTANEOUS
  Administered 2021-01-29: 1 [IU] via SUBCUTANEOUS
  Administered 2021-01-30 (×3): 2 [IU] via SUBCUTANEOUS
  Administered 2021-01-31 (×2): 3 [IU] via SUBCUTANEOUS

## 2021-01-27 MED ORDER — INFLUENZA VAC SPLIT QUAD 0.5 ML IM SUSY
0.5000 mL | PREFILLED_SYRINGE | INTRAMUSCULAR | Status: AC
  Administered 2021-01-28: 0.5 mL via INTRAMUSCULAR
  Filled 2021-01-27: qty 0.5

## 2021-01-27 MED ORDER — ATORVASTATIN CALCIUM 10 MG PO TABS
20.0000 mg | ORAL_TABLET | Freq: Every day | ORAL | Status: DC
  Administered 2021-01-27 – 2021-01-30 (×5): 20 mg via ORAL
  Filled 2021-01-27 (×5): qty 2

## 2021-01-27 MED ORDER — APIXABAN 5 MG PO TABS
5.0000 mg | ORAL_TABLET | Freq: Two times a day (BID) | ORAL | Status: DC
  Administered 2021-01-27 – 2021-01-28 (×4): 5 mg via ORAL
  Filled 2021-01-27 (×4): qty 1

## 2021-01-27 MED ORDER — LABETALOL HCL 200 MG PO TABS: 100.0000 mg | ORAL_TABLET | Freq: Two times a day (BID) | ORAL | Status: DC

## 2021-01-27 MED ORDER — CLONIDINE HCL 0.2 MG PO TABS
0.2000 mg | ORAL_TABLET | Freq: Two times a day (BID) | ORAL | Status: DC
  Administered 2021-01-27 – 2021-01-28 (×3): 0.2 mg via ORAL
  Filled 2021-01-27 (×3): qty 1

## 2021-01-27 MED ORDER — POTASSIUM CHLORIDE CRYS ER 20 MEQ PO TBCR: 40.0000 meq | EXTENDED_RELEASE_TABLET | Freq: Once | ORAL | Status: DC

## 2021-01-27 MED ORDER — METOPROLOL SUCCINATE ER 50 MG PO TB24
50.0000 mg | ORAL_TABLET | Freq: Every day | ORAL | Status: DC
  Administered 2021-01-27 – 2021-01-30 (×4): 50 mg via ORAL
  Filled 2021-01-27: qty 1
  Filled 2021-01-27: qty 2
  Filled 2021-01-27 (×2): qty 1

## 2021-01-27 MED ORDER — NIFEDIPINE ER OSMOTIC RELEASE 60 MG PO TB24: 60.0000 mg | ORAL_TABLET | Freq: Every day | ORAL | Status: DC

## 2021-01-27 MED ORDER — FUROSEMIDE 10 MG/ML IJ SOLN
40.0000 mg | Freq: Two times a day (BID) | INTRAMUSCULAR | Status: DC
  Administered 2021-01-27 – 2021-01-29 (×5): 40 mg via INTRAVENOUS
  Filled 2021-01-27 (×5): qty 4

## 2021-01-27 MED ORDER — FAMOTIDINE 20 MG PO TABS
20.0000 mg | ORAL_TABLET | Freq: Two times a day (BID) | ORAL | Status: DC
  Administered 2021-01-27 – 2021-01-31 (×10): 20 mg via ORAL
  Filled 2021-01-27 (×11): qty 1

## 2021-01-27 MED ORDER — HYDRALAZINE HCL 20 MG/ML IJ SOLN
10.0000 mg | Freq: Once | INTRAMUSCULAR | Status: AC
  Administered 2021-01-28: 10 mg via INTRAVENOUS
  Filled 2021-01-27: qty 1

## 2021-01-27 MED ORDER — POTASSIUM CHLORIDE CRYS ER 20 MEQ PO TBCR
40.0000 meq | EXTENDED_RELEASE_TABLET | Freq: Once | ORAL | Status: AC
  Administered 2021-01-27: 40 meq via ORAL
  Filled 2021-01-27: qty 2

## 2021-01-27 MED ORDER — HYDRALAZINE HCL 25 MG PO TABS
25.0000 mg | ORAL_TABLET | Freq: Four times a day (QID) | ORAL | Status: DC | PRN
  Administered 2021-01-27 – 2021-01-28 (×3): 25 mg via ORAL
  Filled 2021-01-27 (×3): qty 1

## 2021-01-27 MED ORDER — LATANOPROST 0.005 % OP SOLN
1.0000 [drp] | Freq: Every day | OPHTHALMIC | Status: DC
  Administered 2021-01-27 – 2021-01-30 (×4): 1 [drp] via OPHTHALMIC
  Filled 2021-01-27: qty 2.5

## 2021-01-27 MED ORDER — DILTIAZEM HCL ER COATED BEADS 240 MG PO CP24
240.0000 mg | ORAL_CAPSULE | Freq: Every day | ORAL | Status: DC
  Administered 2021-01-27 – 2021-01-28 (×2): 240 mg via ORAL
  Filled 2021-01-27 (×2): qty 1

## 2021-01-27 NOTE — ED Notes (Addendum)
Called lab regarding the status of the BNP that was due at 0500 and per chart collected at 0200. Per lab, they will process it now.

## 2021-01-27 NOTE — ED Notes (Signed)
Pt ambulatory to bathroom w/ no assist needed

## 2021-01-27 NOTE — ED Notes (Signed)
Notified Lama MD about pt BP 144/107.

## 2021-01-27 NOTE — Progress Notes (Signed)
Echocardiogram 2D Echocardiogram has been performed.  Warren Lacy Herma Uballe RDCS 01/27/2021, 11:47 AM

## 2021-01-27 NOTE — Progress Notes (Addendum)
Heart Failure Nurse Navigator Progress Note  PCP: [facility] Medical PCP-Cardiologist: none Admission Diagnosis: A CHF Admitted from: home with family  Presentation:   Randy [name] presented 10/2 with increased SOB, cough, and orthopnea. Sister at bedside. Permission given from pt to interview with family member present. Pt and sister interactive with interview process. Pt states he was trying to get established with [facility]. Last cardiology appt with [facility] in 2021. Pt had prior CVA, states no residual. Ambulates independently, drives and has reliable vehicle. 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  Pt states she drinks a gallon of water daily because he was told it was good for him. Educated on HF with pt education booklet. Reviewed medications, fluid and sodium restrictions.   ECHO/ LVEF: 30-35%, LV global hypokinesis. Severe LV hypertrophy. Mod elevated PAS. LA severely dilated, RA mild dilated. Mild MVR.  2011: 40-45%, LA mild dilated. Mod elevated PAS.   Clinical Course:  Past Medical History:  Diagnosis Date   CKD (chronic kidney disease) stage 3, GFR 30-59 ml/min (HCC)    baseline Cr 1.5-1.7   Decreased visual acuity    Left eye, resolved - hypertensive retinopathy   Diabetes mellitus 2012   dx hospitalization with A1c 6.6%   Hilar adenopathy    on CT scan 02/2010, on rpt scan stable/improved.   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   Systolic murmur    Vitamin D deficiency      Social History   Socioeconomic History   Marital status: Married    Spouse name: Mariana Goytia   Number of children: 3   Years of education: Not on file   Highest education level:  Bachelor's degree (e.g., BA, AB, BS)  Occupational History   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    Comment: [place]  Tobacco Use   Smoking status: Never   Smokeless tobacco: Never   Tobacco comments:    monthly, when going out  Vaping Use   Vaping Use: Never used  Substance and Sexual Activity   Alcohol use: Yes    Comment: Occasional   Drug use: No   Sexual activity: Not on file  Other Topics Concern   Not on file  Social History Narrative   Newly married, 2012, 1 daughter, 1 son   No injectable steroid cycles   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, 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)   Regular exercise-yes   Social Determinants of Health   Financial Resource Strain: Low Risk    Difficulty of Paying Living Expenses: Not hard at all  Food Insecurity: No Food Insecurity   Worried About Programme researcher, broadcasting/film/video in the Last Year: Never true   Ran Out of Food in the Last Year: Never true  Transportation Needs: No Transportation Needs   Lack of Transportation (Medical): No   Lack of Transportation (Non-Medical): No  Physical Activity: Not on file  Stress: Not on file  Social Connections: Not on file    High Risk Criteria for Readmission and/or Poor Patient Outcomes: Heart failure hospital admissions (last 6 months): 1  No Show rate: 28% Difficult social  situation: no Demonstrates medication adherence: yes Primary Language: English Literacy level: able to read/write and comprehend.   Education Assessment and Provision:  Detailed education and instructions provided on heart failure disease management including the following:  Signs and symptoms of Heart Failure When to call the physician Importance of daily weights Low sodium diet Fluid restriction Medication management Anticipated future follow-up appointments  Patient education given on each of the above topics.  Patient acknowledges understanding via teach back method and acceptance of all  instructions.  Education Materials:  "Living Better With Heart Failure" Booklet, HF zone tool, & Daily Weight Tracker Tool.  Patient has scale at home: yes Patient has pill box at home: yes   Barriers of Care:   -new HF dx  Considerations/Referrals:   Referral made to Heart Failure Pharmacist Stewardship: yes Referral made to Heart Failure CSW/NCM TOC: yes Referral made to Heart & Vascular TOC clinic: yes, 10/14 @ 9AM  Items for Follow-up on DC/TOC: -optimize -cont HF education -fluid/sodium modification   Ozella Rocks, MSN, RN Heart Failure Nurse Navigator [phone]

## 2021-01-27 NOTE — H&P (Signed)
History and Physical    Randy [name] PFX:[id number] DOB: 1970-07-03 DOA: 01/26/2021  PCP: [facility] Patient coming from: Home  Chief Complaint: SOB  HPI: Randy [name] is a 50 y.o. male with medical history significant of chronic systolic CHF, permanent A. fib on Eliquis, hypertension, hypertensive retinopathy, CKD stage IIIa, hyperlipidemia, non-insulin-dependent type 2 diabetes, history of Bell's palsy, morbid obesity (BMI 43.71), OSA, prior strokes presented to the ED complaining of acute onset shortness of breath.  Febrile with temperature 100.2 F, slightly tachycardic and tachypneic.  Labs showing WBC 13.6.  Hemoglobin 12.1, MCV 80.5.  Sodium 131.  Potassium 3.4.  Creatinine 1.8, stable.  High-sensitivity troponin mildly elevated but stable (43 > 45).  Magnesium 1.8.  COVID and influenza PCR negative.  BNP 318.  Chest x-ray showing pulmonary edema. Patient was given IV Cardizem 20 mg and 1 L LR bolus.  Subsequently given IV Lasix 20 mg.  Patient reports 2-day history of shortness of breath, cough, and orthopnea.  States he does not take a fluid pill because his primary care physician and cardiologist felt that it would cause kidney problems.  Does endorse drinking a gallon of fluids daily.  States he was told in the emergency room that he had a fever but he was not having any fevers at home.  Denies chest pain.  He takes several medications for high blood pressure and his physician is trying to cut down the dose of his medications as his blood pressure has been low.  Review of Systems:  All systems reviewed and apart from history of presenting illness, are negative.  Past Medical History:  Diagnosis Date   CKD (chronic kidney disease) stage 3, GFR 30-59 ml/min (HCC)    baseline Cr 1.5-1.7   Decreased visual acuity    Left eye, resolved - hypertensive retinopathy   Diabetes mellitus 2012   dx hospitalization with A1c 6.6%   Hilar adenopathy    on CT scan 02/2010, on rpt  scan stable/improved.   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 He has never used smokeless tobacco. He reports current alcohol use. He reports that he does not use drugs.  Allergies  Allergen Reactions   Fish-Derived Products Anaphylaxis    Family History  Problem Relation Age of Onset   Hypertension Mother    Hypertension Father    Diabetes Father    Hypertension Brother    Diabetes Sister    Coronary artery disease Neg Hx    Stroke Neg Hx    Cancer Neg Hx  Kidney disease Paternal Grandmother        ESRD    Prior to Admission medications   Medication Sig Start Date End Date Taking? Authorizing Provider  apixaban (ELIQUIS) 5 MG TABS tablet Take 1 tablet (5 mg total) by mouth 2 (two) times daily. 08/12/20 08/07/21 Yes Nita Sells, MD  aspirin EC 81 MG tablet Take 243 mg by mouth daily as needed for mild pain. Swallow whole.   Yes [provider]  atorvastatin (LIPITOR) 20 MG tablet Take 20 mg by mouth at bedtime. 07/12/20  Yes [provider]  cloNIDine (CATAPRES) 0.2 MG tablet Take 0.2 mg by mouth 2 (two) times daily. 09/15/20  Yes [provider]  diltiazem (CARDIZEM CD) 240  MG 24 hr capsule Take 240 mg by mouth daily. 07/11/20  Yes [provider]  famotidine (PEPCID) 20 MG tablet Take 1 tablet (20 mg total) by mouth 2 (two) times daily. 09/25/15  Yes Carrie Mew, MD  glimepiride (AMARYL) 4 MG tablet Take 4 mg by mouth daily with breakfast.   Yes [provider]  hydrOXYzine (ATARAX/VISTARIL) 25 MG tablet Take 1 tablet (25 mg total) by mouth every 6 (six) hours. Patient taking differently: Take 25 mg by mouth every 6 (six) hours as needed for anxiety. 07/29/15  Yes Patel-Mills, Orvil Feil, PA-C  labetalol (NORMODYNE) 100 MG tablet Take 100 mg by mouth 2 (two) times daily.   Yes [provider]  latanoprost (XALATAN) 0.005 % ophthalmic solution Place 1 drop into both eyes at bedtime. 01/12/21  Yes [provider]  losartan (COZAAR) 100 MG tablet Take 100 mg by mouth daily. 01/01/21  Yes [provider]  metFORMIN (GLUCOPHAGE) 500 MG tablet Take 500 mg by mouth 2 (two) times daily with a meal. 02/29/20  Yes [provider]  metoprolol succinate (TOPROL-XL) 50 MG 24 hr tablet Take 50 mg by mouth daily. Take with or immediately following a meal.   Yes [provider]  NIFEdipine (ADALAT CC) 60 MG 24 hr tablet Take 1 tablet (60 mg total) by mouth daily. Take a half tablet daily for 3 days and then go back to full strength 08/08/20  Yes Nita Sells, MD  Vitamin D, Ergocalciferol, (DRISDOL) 1.25 MG (50000 UNIT) CAPS capsule Take 1 capsule by mouth every Monday. 07/25/20  Yes [provider]  glimepiride (AMARYL) 2 MG tablet TAKE ONE TABLET BY MOUTH ONCE DAILY BEFORE BREAKFAST Patient not taking: No sig reported 01/29/16   Ria Bush, MD  labetalol (NORMODYNE) 300 MG tablet Take 1 tablet (300 mg total) by mouth 2 (two) times daily. Patient not taking: Reported on 01/27/2021 01/29/16   Ria Bush, MD    Physical Exam: Vitals:   01/26/21 2054 01/26/21 2115 01/26/21 2145 01/26/21 2215  BP:   127/90 125/79 (!) 137/101  Pulse: 87 96 95 87  Resp: (!) 28 16 (!) 24 (!) 23  Temp:      TempSrc:      SpO2: 97% 90% 95% 97%  Weight:      Height:        Physical Exam Constitutional:      General: He is not in acute distress. HENT:     Head: Normocephalic and atraumatic.  Eyes:     Extraocular Movements: Extraocular movements intact.     Conjunctiva/sclera: Conjunctivae normal.  Cardiovascular:     Rate and Rhythm: Normal rate and regular rhythm.     Pulses: Normal pulses.  Pulmonary:     Effort:  Pulmonary effort is normal. No respiratory distress.     Breath sounds: No wheezing.     Comments: Examination limited due to patient's body habitus Abdominal:     General: Bowel sounds are normal.     Palpations: Abdomen is soft.     Tenderness: There is no abdominal tenderness. There is no guarding.  Musculoskeletal:     Cervical back: Normal range of motion and neck supple.     Right lower leg: Edema present.     Left lower leg: Edema present.     Comments: Bilateral pedal edema  Skin:    General: Skin is warm and dry.  Neurological:     General: No focal deficit present.     Mental Status: He is alert and oriented to person, place, and time.     Labs on Admission: I have personally reviewed following labs and imaging studies  CBC: Recent Labs  Lab 01/26/21 1917  WBC 13.6*  HGB 12.1*  HCT 37.6*  MCV 80.5  PLT 382   Basic Metabolic Panel: Recent Labs  Lab 01/26/21 1917  NA 131*  K 3.4*  CL 99  CO2 21*  GLUCOSE 172*  BUN 17  CREATININE 1.81*  CALCIUM 8.8*  MG 1.8   GFR: Estimated Creatinine Clearance: 91.6 mL/min (A) (by C-G formula based on SCr of 1.81 mg/dL (H)). Liver Function Tests: No results for input(s): AST, ALT, ALKPHOS, BILITOT, PROT, ALBUMIN in the last 168 hours. No results for input(s): LIPASE, AMYLASE in the last 168 hours. No results for input(s): AMMONIA in the last 168 hours. Coagulation Profile: No results for input(s): INR,  PROTIME in the last 168 hours. Cardiac Enzymes: No results for input(s): CKTOTAL, CKMB, CKMBINDEX, TROPONINI in the last 168 hours. BNP (last 3 results) No results for input(s): PROBNP in the last 8760 hours. HbA1C: No results for input(s): HGBA1C in the last 72 hours. CBG: No results for input(s): GLUCAP in the last 168 hours. Lipid Profile: No results for input(s): CHOL, HDL, LDLCALC, TRIG, CHOLHDL, LDLDIRECT in the last 72 hours. Thyroid Function Tests: No results for input(s): TSH, T4TOTAL, FREET4, T3FREE, THYROIDAB in the last 72 hours. Anemia Panel: No results for input(s): VITAMINB12, FOLATE, FERRITIN, TIBC, IRON, RETICCTPCT in the last 72 hours. Urine analysis:    Component Value Date/Time   COLORURINE YELLOW 03/12/2010 1140   APPEARANCEUR CLEAR 03/12/2010 1140   LABSPEC 1.029 03/12/2010 1140   PHURINE 6.5 03/12/2010 1140   GLUCOSEU NEGATIVE 03/12/2010 1140   HGBUR NEGATIVE 03/12/2010 1140   HGBUR trace-intact 03/19/2008 1441   BILIRUBINUR Negative 05/29/2011 1609   KETONESUR 15 (A) 03/12/2010 1140   PROTEINUR Negative 05/29/2011 1609   PROTEINUR 100 (A) 03/12/2010 1140   UROBILINOGEN 0.2 05/29/2011 1609   UROBILINOGEN 1.0 03/12/2010 1140   NITRITE Negative 05/29/2011 1609   NITRITE NEGATIVE 03/12/2010 1140   LEUKOCYTESUR Negative 05/29/2011 1609    Radiological Exams on Admission: DG Chest 2 View  Result Date: 01/26/2021 CLINICAL DATA:  Sudden onset shortness of breath. EXAM: CHEST - 2 VIEW COMPARISON:  09/25/2015 FINDINGS: Heart is enlarged. There is pulmonary vascular congestion and mild, diffuse interstitial opacities primarily at the bases. Small bilateral pleural effusions are noted. No focal opacities. IMPRESSION: Findings consistent with pulmonary edema. Electronically Signed   By: Nolon Nations M.D.   On: 01/26/2021 19:47    EKG: Independently reviewed.  A. fib with RVR.  Assessment/Plan Principal Problem:   New onset of congestive heart failure  (HCC) Active Problems:  Permanent atrial fibrillation (HCC)   SIRS (systemic inflammatory response syndrome) (HCC)   HTN (hypertension)   Hypokalemia   Acute on chronic systolic CHF Not hypoxic.  BNP elevated and chest x-ray showing pulmonary edema.  Has pedal edema on exam.  Echo done in August 2021 at [place] showing EF 40-45% and severe LVH.  Patient is not on any diuretics and reports drinking a gallon of fluids daily.  Patient was given 1 L fluid bolus in the ED followed by IV Lasix 20 mg but has not urinated yet.  Bladder scan showing 0 mL. -Give additional IV Lasix 20 mg x 1, repeat labs in a.m. to check renal function before ordering additional doses of Lasix. -Monitor intake and output -Daily weights -Dietary sodium and fluid restriction -Repeat echocardiogram  Permanent A. fib with RVR Patient was given Cardizem bolus in the ED.  Currently rate controlled, rate in the 80s to 90s. -Continue Eliquis, Cardizem, metoprolol  SIRS Borderline febrile, tachycardic, and tachypneic on arrival.  Not hypotensive.  Labs showing mild leukocytosis.  Chest x-ray not suggestive of pneumonia.  COVID and influenza PCR negative.  Patient is not endorsing any infectious symptoms, no fevers at home. -UA to assess for possible UTI -Check procalcitonin level -Lactic acid level -Monitor WBC count  Mild troponin elevation Likely due to demand ischemia in the setting of decompensated CHF and permanent A. fib with RVR.  High-sensitivity troponin mildly elevated but stable (43 > 45).  Patient is not endorsing any chest pain. -Cardiac monitoring  Hypertension Blood pressure currently stable.  Patient is on multiple antihypertensive medications and states his PCP is currently trying to cut down the dose of his medications due to concern for hypotension. -Continue Cardizem and metoprolol for A. fib rate control.  Continue clonidine to prevent rebound hypertension.  Hold labetalol and nifedipine for  now.  Monitor blood pressure closely.  CKD stage IIIa Creatinine 1.8, stable compared to labs done in April 2022. -Continue to monitor closely  Mild normocytic anemia Possibly due to CKD.  Patient is not endorsing any symptoms of GI bleed. Hemoglobin previously normal on labs done in April 2022. -Anemia panel  Mild hyponatremia Likely due to decompensated CHF. -Lasix for diuresis -Continue to monitor  Mild hypokalemia Magnesium within normal range. -Replenish potassium, continue to monitor.  Hyperlipidemia -Continue Lipitor  Non-insulin-dependent type 2 diabetes -Check A1c.  Sliding scale insulin sensitive ACHS.  Hold glimepiride and metformin.  DVT prophylaxis: Eliquis Code Status: Full code Family Communication: Brother at bedside. Disposition Plan: Status is: Observation  The patient remains OBS appropriate and will d/c before 2 midnights.  Dispo: The patient is from: Home              Anticipated d/c is to: Home              Patient currently is not medically stable to d/c.   Difficult to place patient No  Level of care: Level of care: Telemetry Cardiac  The medical decision making on this patient was of high complexity and the patient is at high risk for clinical deterioration, therefore this is a level 3 visit.  Shela Leff MD [facility]  If 7PM-7AM, please contact night-coverage [email or web address]  01/27/2021, 2:14 AM

## 2021-01-27 NOTE — ED Notes (Signed)
Dr. Loney Loh made aware that pt has not urinated after IV lasix administration at 2215, 10/2 & bladder scan repeatedly measuring "0 mL."

## 2021-01-27 NOTE — Progress Notes (Signed)
Subjective: Patient admitted this morning, see detailed H&P by Dr Loney Loh 50 year old male with history of chronic systolic CHF, permanent atrial fibrillation on Eliquis, hypertension, hypertensive retinopathy, CKD stage III, hyperlipidemia, diabetes mellitus type 2, history of Bell's palsy, OSA, prior strokes came to ED with worsening shortness of breath.  BNP was 318.  Chest x-ray showed pulmonary edema.  Patient was given IV Lasix 20 mg x 1.  Patient said that he has not taken fluid pill because cardiology and PCP felt that it could cause kidney problems.  Vitals:   01/27/21 0345 01/27/21 0515  BP: (!) 154/105 (!) 152/82  Pulse: 99 99  Resp: 20 (!) 28  Temp:    SpO2: 98% 93%      A/P Acute on chronic systolic CHF -Last echocardiogram showed EF 40 to 45% with severe LVH. -We will start Lasix 40 mg IV every 12 hours -Follow echocardiogram -Monitor strict intake and output -Follow BMP in am  Permanent atrial fibrillation with RVR -Patient received Cardizem bolus in the ED -Heart rate is controlled -Continue Cardizem, metoprolol -Continue Eliquis for anticoagulation  Mild troponin elevation -Likely demand ischemia in the setting of decompensated CHF and A. fib with RVR  Hypertension -Blood pressure is stable -Continue home medication including metoprolol, clonidine -Nifedipine and labetalol are currently on hold  CKD stage IIIa -Patient baseline creatinine 1.8 as of April 2022 -Started on Lasix as above -Closely monitor renal function in the hospital  Diabetes mellitus type 2 -Continue sliding scale insulin NovoLog -Metformin and glimepiride are on hold    Meredeth Ide [facility] Pager- [phone]

## 2021-01-28 ENCOUNTER — Other Ambulatory Visit (HOSPITAL_COMMUNITY): Payer: Self-pay

## 2021-01-28 DIAGNOSIS — N1832 Chronic kidney disease, stage 3b: Secondary | ICD-10-CM

## 2021-01-28 DIAGNOSIS — I5021 Acute systolic (congestive) heart failure: Secondary | ICD-10-CM

## 2021-01-28 DIAGNOSIS — I5023 Acute on chronic systolic (congestive) heart failure: Secondary | ICD-10-CM | POA: Diagnosis not present

## 2021-01-28 DIAGNOSIS — I110 Hypertensive heart disease with heart failure: Secondary | ICD-10-CM | POA: Diagnosis not present

## 2021-01-28 DIAGNOSIS — I4891 Unspecified atrial fibrillation: Secondary | ICD-10-CM | POA: Diagnosis not present

## 2021-01-28 DIAGNOSIS — I4821 Permanent atrial fibrillation: Secondary | ICD-10-CM

## 2021-01-28 DIAGNOSIS — I10 Essential (primary) hypertension: Secondary | ICD-10-CM

## 2021-01-28 LAB — GLUCOSE, CAPILLARY
Glucose-Capillary: 137 mg/dL — ABNORMAL HIGH (ref 70–99)
Glucose-Capillary: 157 mg/dL — ABNORMAL HIGH (ref 70–99)
Glucose-Capillary: 142 mg/dL — ABNORMAL HIGH (ref 70–99)
Glucose-Capillary: 261 mg/dL — ABNORMAL HIGH (ref 70–99)

## 2021-01-28 LAB — BASIC METABOLIC PANEL
Anion gap: 10 (ref 5–15)
BUN: 21 mg/dL — ABNORMAL HIGH (ref 6–20)
CO2: 26 mmol/L (ref 22–32)
Calcium: 9.1 mg/dL (ref 8.9–10.3)
Chloride: 102 mmol/L (ref 98–111)
Creatinine, Ser: 1.83 mg/dL — ABNORMAL HIGH (ref 0.61–1.24)
GFR, Estimated: 45 mL/min — ABNORMAL LOW (ref >60)
Glucose, Bld: 178 mg/dL — ABNORMAL HIGH (ref 70–99)
Potassium: 3.6 mmol/L (ref 3.5–5.1)
Sodium: 138 mmol/L (ref 135–145)

## 2021-01-28 MED ORDER — POTASSIUM CHLORIDE CRYS ER 20 MEQ PO TBCR
40.0000 meq | EXTENDED_RELEASE_TABLET | Freq: Once | ORAL | Status: AC
  Administered 2021-01-28: 40 meq via ORAL
  Filled 2021-01-28: qty 2

## 2021-01-28 MED ORDER — HYDRALAZINE HCL 50 MG PO TABS: 50.0000 mg | ORAL_TABLET | Freq: Four times a day (QID) | ORAL | Status: DC | PRN

## 2021-01-28 MED ORDER — SODIUM CHLORIDE 0.9 % IV SOLN
INTRAVENOUS | Status: DC
Start: 2021-01-29 — End: 2021-01-29

## 2021-01-28 MED ORDER — CLONIDINE HCL 0.1 MG PO TABS
0.1000 mg | ORAL_TABLET | Freq: Two times a day (BID) | ORAL | Status: DC
  Administered 2021-01-28 – 2021-01-31 (×6): 0.1 mg via ORAL
  Filled 2021-01-28 (×6): qty 1

## 2021-01-28 MED ORDER — AMIODARONE LOAD VIA INFUSION
150.0000 mg | Freq: Once | INTRAVENOUS | Status: AC
  Administered 2021-01-28: 150 mg via INTRAVENOUS
  Filled 2021-01-28 (×2): qty 83.34

## 2021-01-28 MED ORDER — ISOSORB DINITRATE-HYDRALAZINE 20-37.5 MG PO TABS
0.5000 | ORAL_TABLET | Freq: Three times a day (TID) | ORAL | Status: DC
  Administered 2021-01-28 – 2021-01-29 (×3): 0.5 via ORAL
  Filled 2021-01-28 (×3): qty 1

## 2021-01-28 MED ORDER — AMIODARONE HCL IN DEXTROSE 360-4.14 MG/200ML-% IV SOLN
30.0000 mg/h | INTRAVENOUS | Status: DC
Start: 2021-01-28 — End: 2021-01-31
  Administered 2021-01-28 – 2021-01-31 (×8): 30 mg/h via INTRAVENOUS
  Filled 2021-01-28 (×8): qty 200

## 2021-01-28 MED ORDER — SODIUM CHLORIDE 0.9 % IV SOLN
250.0000 mL | INTRAVENOUS | Status: DC | PRN
Start: 2021-01-28 — End: 2021-01-29

## 2021-01-28 MED ORDER — AMIODARONE HCL IN DEXTROSE 360-4.14 MG/200ML-% IV SOLN
60.0000 mg/h | INTRAVENOUS | Status: AC
  Administered 2021-01-28: 60 mg/h via INTRAVENOUS

## 2021-01-28 MED ORDER — SODIUM CHLORIDE 0.9% FLUSH
3.0000 mL | Freq: Two times a day (BID) | INTRAVENOUS | Status: DC
  Administered 2021-01-28 – 2021-01-30 (×4): 3 mL via INTRAVENOUS

## 2021-01-28 MED ORDER — APIXABAN 5 MG PO TABS: 5.0000 mg | ORAL_TABLET | Freq: Two times a day (BID) | ORAL | Status: DC

## 2021-01-28 MED ORDER — SODIUM CHLORIDE 0.9% FLUSH: 3.0000 mL | INTRAVENOUS | Status: DC | PRN

## 2021-01-28 MED ORDER — ASPIRIN 81 MG PO CHEW
81.0000 mg | CHEWABLE_TABLET | ORAL | Status: AC
  Administered 2021-01-29: 81 mg via ORAL
  Filled 2021-01-28: qty 1

## 2021-01-28 NOTE — Progress Notes (Signed)
Heart Failure Navigator Progress Note  Assessed for Heart & Vascular TOC clinic readiness.  Patient does not meet criteria due to AHF rounding team consulted.  Cancelled HV TOC appt.   Navigator available for reassessment of patient.   Ozella Rocks, MSN, RN Heart Failure Nurse Navigator [phone]

## 2021-01-28 NOTE — Progress Notes (Signed)
[facility]  PROGRESS NOTE  Randy [name] YQI:[id number] DOB: 11/27/1970 DOA: 01/26/2021 PCP: [facility]   Brief HPI:   50 year old male with history of chronic systolic CHF, permanent atrial fibrillation on Eliquis, hypertension, hypertensive retinopathy, CKD stage III, hyperlipidemia, diabetes mellitus type 2, history of Bell's palsy, OSA, prior strokes came to ED with worsening shortness of breath.  BNP was 318.  Chest x-ray showed pulmonary edema.  Patient was given IV Lasix 20 mg x 1.  Patient said that he has not taken fluid pill because cardiology and PCP felt that it could cause kidney problems.    Subjective   Patient seen and examined, breathing is significantly improved.  Diuresed well with IV Lasix.  Total urine output around 2 L.     Assessment/Plan:    Acute on chronic systolic CHF -Echocardiogram shows EF of 30 to 35%, left ventricular function is moderately decreased.  Left ventricle demonstrates global hypokinesis. -Diuresed well with IV Lasix 40 mg every 12 hours -Total urine output around 2 L over past 24 hours -We will consult cardiology for further recommendations  Permanent atrial fibrillation with RVR -Heart rate is controlled -Cardiology was consulted and Cardizem has been discontinued -Patient started on amiodarone gtt. -Continue metoprolol -Continue anticoagulation with Eliquis  Mild elevation of troponin -Likely demand ischemia in the setting of decompensated CHF, A. fib with RVR  Hypertension -Blood pressure is stable -Continue metoprolol, clonidine -Nifedipine and labetalol currently on hold  CKD stage IIIa -Patient baseline creatinine is 1.8 as of April 2022 -Started on Lasix as above -Today creatinine is down to 1.83 with diuresis -Follow BMP in am  Diabetes mellitus type 2 -Continue sliding scale insulin with NovoLog -Metformin,glimepiride on hold -CBG well controlled    Scheduled medications:    amiodarone  150  mg Intravenous Once   atorvastatin  20 mg Oral QHS   cloNIDine  0.1 mg Oral BID   famotidine  20 mg Oral BID   furosemide  40 mg Intravenous Q12H   insulin aspart  0-5 Units Subcutaneous QHS   insulin aspart  0-9 Units Subcutaneous TID WC   isosorbide-hydrALAZINE  0.5 tablet Oral TID   latanoprost  1 drop Both Eyes QHS   metoprolol succinate  50 mg Oral Daily   sodium chloride flush  3 mL Intravenous Q12H     Data Reviewed:   CBG:  Recent Labs  Lab 01/27/21 1151 01/27/21 1844 01/27/21 2251 01/28/21 0600 01/28/21 1150  GLUCAP 149* 119* 144* 137* 157*    SpO2: 98 %    Vitals:   01/28/21 0021 01/28/21 0432 01/28/21 1003 01/28/21 1152  BP: (!) 164/110 (!) 162/110 (!) 160/109 (!) 140/106  Pulse: (!) 105 (!) 111 89 70  Resp: 20 (!) 22  18  Temp: 98.9 F (37.2 C) 99.4 F (37.4 C)  99 F (37.2 C)  TempSrc: Oral Oral  Oral  SpO2: 97% 98%  98%  Weight:  (!) 179.4 kg    Height:         Intake/Output Summary (Last 24 hours) at 01/28/2021 1635 Last data filed at 01/28/2021 1300 Gross per 24 hour  Intake 480 ml  Output 1975 ml  Net -1495 ml    10/02 1901 - 10/04 0700 In: 1000  Out: 2150 [Urine:2150]  Filed Weights   01/26/21 1912 01/28/21 0432  Weight: (!) 180.5 kg (!) 179.4 kg    Data Reviewed: Basic Metabolic Panel: Recent Labs  Lab 01/26/21 1917 01/27/21 0222 01/28/21 3875  NA 131* 131* 138  K 3.4* 3.6 3.6  CL 99 100 102  CO2 21* 21* 26  GLUCOSE 172* 201* 178*  BUN 17 21* 21*  CREATININE 1.81* 2.31* 1.83*  CALCIUM 8.8* 8.5* 9.1  MG 1.8  --   --    Liver Function Tests: No results for input(s): AST, ALT, ALKPHOS, BILITOT, PROT, ALBUMIN in the last 168 hours. No results for input(s): LIPASE, AMYLASE in the last 168 hours. No results for input(s): AMMONIA in the last 168 hours. CBC: Recent Labs  Lab 01/26/21 1917 01/27/21 0222  WBC 13.6* 14.2*  HGB 12.1* 11.1*  HCT 37.6* 34.8*  MCV 80.5 80.6  PLT 330 306   Cardiac Enzymes: No results for  input(s): CKTOTAL, CKMB, CKMBINDEX, TROPONINI in the last 168 hours. BNP (last 3 results) Recent Labs    01/26/21 1942  BNP 318.4*    ProBNP (last 3 results) No results for input(s): PROBNP in the last 8760 hours.  CBG: Recent Labs  Lab 01/27/21 1151 01/27/21 1844 01/27/21 2251 01/28/21 0600 01/28/21 1150  GLUCAP 149* 119* 144* 137* 157*       Radiology Reports  DG Chest 2 View  Result Date: 01/26/2021 CLINICAL DATA:  Sudden onset shortness of breath. EXAM: CHEST - 2 VIEW COMPARISON:  09/25/2015 FINDINGS: Heart is enlarged. There is pulmonary vascular congestion and mild, diffuse interstitial opacities primarily at the bases. Small bilateral pleural effusions are noted. No focal opacities. IMPRESSION: Findings consistent with pulmonary edema. Electronically Signed   By: Norva Pavlov M.D.   On: 01/26/2021 19:47   ECHOCARDIOGRAM COMPLETE  Result Date: 01/27/2021    ECHOCARDIOGRAM REPORT   Patient Name:   Randy [name] Date of Exam: 01/27/2021 Medical Rec #:  [id number]      Height:       80.0 in Accession #:    [id number]     Weight:       397.9 lb Date of Birth:  1970-06-24      BSA:          3.080 m Patient Age:    50 years       BP:           151/114 mmHg Patient Gender: M              HR:           74 bpm. Exam Location:  Inpatient Procedure: 2D Echo, 3D Echo, Color Doppler and Cardiac Doppler Indications:    I50.21 Acute systolic (congestive) heart failure  History:        Patient has prior history of Echocardiogram examinations, most                 recent 12/04/2019. CHF, Arrythmias:Atrial Fibrillation; Risk                 Factors:Diabetes and Hypertension. Prior performed at [facility].  Sonographer:    Irving Burton Senior RDCS Referring Phys: [id number] VASUNDHRA RATHORE IMPRESSIONS  1. Myocardium with speckled appearance; consider amyloid.  2. Left ventricular ejection fraction, by estimation, is 30 to 35%. The left ventricle has moderately decreased function. The left ventricle  demonstrates global hypokinesis. The left ventricular internal cavity size was mildly dilated. There is severe left ventricular hypertrophy. Left ventricular diastolic function could not be evaluated.  3. Right ventricular systolic function is normal. The right ventricular size is normal. There is moderately elevated pulmonary artery systolic pressure.  4. Left atrial size was severely dilated.  5. Right  atrial size was mildly dilated.  6. The mitral valve is normal in structure. Mild mitral valve regurgitation. No evidence of mitral stenosis.  7. The aortic valve is tricuspid. Aortic valve regurgitation is not visualized. No aortic stenosis is present.  8. Aortic dilatation noted. There is borderline dilatation of the aortic root, measuring 38 mm. There is mild dilatation of the ascending aorta, measuring 43 mm.  9. The inferior vena cava is dilated in size with <50% respiratory variability, suggesting right atrial pressure of 15 mmHg. FINDINGS  Left Ventricle: Left ventricular ejection fraction, by estimation, is 30 to 35%. The left ventricle has moderately decreased function. The left ventricle demonstrates global hypokinesis. The left ventricular internal cavity size was mildly dilated. There is severe left ventricular hypertrophy. Left ventricular diastolic function could not be evaluated due to atrial fibrillation. Left ventricular diastolic function could not be evaluated. Right Ventricle: The right ventricular size is normal. Right ventricular systolic function is normal. There is moderately elevated pulmonary artery systolic pressure. The tricuspid regurgitant velocity is 2.87 m/s, and with an assumed right atrial pressure of 15 mmHg, the estimated right ventricular systolic pressure is 47.9 mmHg. Left Atrium: Left atrial size was severely dilated. Right Atrium: Right atrial size was mildly dilated. Pericardium: There is no evidence of pericardial effusion. Mitral Valve: The mitral valve is normal in  structure. Mild mitral valve regurgitation. No evidence of mitral valve stenosis. Tricuspid Valve: The tricuspid valve is normal in structure. Tricuspid valve regurgitation is mild . No evidence of tricuspid stenosis. Aortic Valve: The aortic valve is tricuspid. Aortic valve regurgitation is not visualized. No aortic stenosis is present. Pulmonic Valve: The pulmonic valve was normal in structure. Pulmonic valve regurgitation is mild. No evidence of pulmonic stenosis. Aorta: Aortic dilatation noted. There is borderline dilatation of the aortic root, measuring 38 mm. There is mild dilatation of the ascending aorta, measuring 43 mm. Venous: The inferior vena cava is dilated in size with less than 50% respiratory variability, suggesting right atrial pressure of 15 mmHg. IAS/Shunts: No atrial level shunt detected by color flow Doppler. Additional Comments: Myocardium with speckled appearance; consider amyloid.  LEFT VENTRICLE PLAX 2D LVIDd:         5.80 cm LVIDs:         5.00 cm LV PW:         1.60 cm LV IVS:        1.50 cm LVOT diam:     2.50 cm  3D Volume EF: LV SV:         78       3D EF:        36 % LV SV Index:   25       LV EDV:       322 ml LVOT Area:     4.91 cm LV ESV:       207 ml                         LV SV:        115 ml RIGHT VENTRICLE RV S prime:     6.90 cm/s TAPSE (M-mode): 1.6 cm LEFT ATRIUM              Index       RIGHT ATRIUM           Index LA diam:        5.40 cm  1.75 cm/m  RA Area:  29.30 cm LA Vol (A2C):   145.0 ml 47.07 ml/m RA Volume:   105.00 ml 34.09 ml/m LA Vol (A4C):   151.0 ml 49.02 ml/m LA Biplane Vol: 156.0 ml 50.64 ml/m  AORTIC VALVE LVOT Vmax:   85.10 cm/s LVOT Vmean:  64.933 cm/s LVOT VTI:    0.159 m  AORTA Ao Root diam: 3.80 cm Ao Asc diam:  4.30 cm TRICUSPID VALVE TR Peak grad:   32.9 mmHg TR Vmax:        287.00 cm/s  SHUNTS Systemic VTI:  0.16 m Systemic Diam: 2.50 cm Olga Millers MD Electronically signed by Olga Millers MD Signature Date/Time: 01/27/2021/1:34:19 PM     Final        Antibiotics: Anti-infectives (From admission, onward)    None         DVT prophylaxis: Eliquis  Code Status: Full code  Family Communication: Discussed with patient's wife at bedside   Consultants:   Procedures: Echocardiogram    Objective    Physical Examination:   General-appears in no acute distress Heart-S1-S2, regular, no murmur auscultated Neck-positive JVD Lungs-clear to auscultation bilaterally, no wheezing or crackles auscultated Abdomen-soft, nontender, no organomegaly Extremities-trace edema bilaterally in the lower extremities Neuro-alert, oriented x3, no focal deficit noted  Status is: Inpatient  Dispo: The patient is from: Home              Anticipated d/c is to: Home              Anticipated d/c date is: 01/31/2021              Patient currently not stable for discharge  Barrier to discharge-ongoing treatment for CHF exacerbation  COVID-19 Labs  Recent Labs    01/27/21 0222  FERRITIN 602*    Lab Results  Component Value Date   SARSCOV2NAA NEGATIVE 01/26/2021   SARSCOV2NAA NEGATIVE 08/07/2020            Recent Results (from the past 240 hour(s))  Resp Panel by RT-PCR (Flu A&B, Covid) Nasopharyngeal Swab     Status: None   Collection Time: 01/26/21  7:24 PM   Specimen: Nasopharyngeal Swab; Nasopharyngeal(NP) swabs in vial transport medium  Result Value Ref Range Status   SARS Coronavirus 2 by RT PCR NEGATIVE NEGATIVE Final    Comment: (NOTE) SARS-CoV-2 target nucleic acids are NOT DETECTED.  The SARS-CoV-2 RNA is generally detectable in upper respiratory specimens during the acute phase of infection. The lowest concentration of SARS-CoV-2 viral copies this assay can detect is 138 copies/mL. A negative result does not preclude SARS-Cov-2 infection and should not be used as the sole basis for treatment or other patient management decisions. A negative result may occur with  improper specimen  collection/handling, submission of specimen other than nasopharyngeal swab, presence of viral mutation(s) within the areas targeted by this assay, and inadequate number of viral copies(<138 copies/mL). A negative result must be combined with clinical observations, patient history, and epidemiological information. The expected result is Negative.  Fact Sheet for Patients:  BloggerCourse.com  Fact Sheet for Healthcare Providers:  SeriousBroker.it  This test is no t yet approved or cleared by the [place] FDA and  has been authorized for detection and/or diagnosis of SARS-CoV-2 by FDA under an Emergency Use Authorization (EUA). This EUA will remain  in effect (meaning this test can be used) for the duration of the COVID-19 declaration under Section 564(b)(1) of the Act, 21 U.S.C.section 360bbb-3(b)(1), unless the authorization is terminated  or  revoked sooner.       Influenza A by PCR NEGATIVE NEGATIVE Final   Influenza B by PCR NEGATIVE NEGATIVE Final    Comment: (NOTE) The Xpert Xpress SARS-CoV-2/FLU/RSV plus assay is intended as an aid in the diagnosis of influenza from Nasopharyngeal swab specimens and should not be used as a sole basis for treatment. Nasal washings and aspirates are unacceptable for Xpert Xpress SARS-CoV-2/FLU/RSV testing.  Fact Sheet for Patients: BloggerCourse.com  Fact Sheet for Healthcare Providers: SeriousBroker.it  This test is not yet approved or cleared by the [place] FDA and has been authorized for detection and/or diagnosis of SARS-CoV-2 by FDA under an Emergency Use Authorization (EUA). This EUA will remain in effect (meaning this test can be used) for the duration of the COVID-19 declaration under Section 564(b)(1) of the Act, 21 U.S.C. section 360bbb-3(b)(1), unless the authorization is terminated or revoked.  Performed at [facility], [address] [place], [place] [address]     Meredeth Ide   [facility] If 7PM-7AM, please contact night-coverage at [email or web address], Office  [phone]   01/28/2021, 4:35 PM  LOS: 1 day

## 2021-01-28 NOTE — TOC Initial Note (Signed)
Transition of Care [facility]) - Initial/Assessment Note    Patient Details  Name: Randy [name] MRN: [id number] Date of Birth: Nov 04, 1970  Transition of [facility]) CM/SW Contact:    Elliot Cousin, RN Phone Number:[phone] 01/28/2021, 3:33 PM  Clinical Narrative:                 HF TOC CM spoke to pt and sister at bedtime. Pt states he and his wife cook at home and try to modify diet to be heart healthy while monitoring his carbohydrates due to his diabetes. Pt did not wish to see diabetes coordinator at this time. Able to afford meds. Works full-time. Will check with patient to see if he needs a note for work.   Expected Discharge Plan: Home/Self Care     Patient Goals and CMS Choice  Expected Discharge Plan and Services Expected Discharge Plan: Home/Self Care In-house Referral: Clinical Social Work Discharge Planning Services: CM Consult   Living arrangements for the past 2 months: Single Family Home   Prior Living Arrangements/Services Living arrangements for the past 2 months: Single Family Home Lives with:: Spouse   Do you feel safe going back to the place where you live?: Yes      Need for Family Participation in Patient Care: No (Comment) Care giver support system in place?: No (comment)   Criminal Activity/Legal Involvement Pertinent to Current Situation/Hospitalization: No - Comment as needed  Activities of Daily Living Home Assistive Devices/Equipment: Eyeglasses ADL Screening (condition at time of admission) Patient's cognitive ability adequate to safely complete daily activities?: Yes Is the patient deaf or have difficulty hearing?: No Does the patient have difficulty seeing, even when wearing glasses/contacts?: No Does the patient have difficulty concentrating, remembering, or making decisions?: No Patient able to express need for assistance with ADLs?: Yes Does the patient have difficulty dressing or bathing?: No Independently performs ADLs?: Yes  (appropriate for developmental age) Does the patient have difficulty walking or climbing stairs?: No Weakness of Legs: None Weakness of Arms/Hands: None  Permission Sought/Granted Permission sought to share information with : Case Manager, PCP Permission granted to share information with : Yes, Verbal Permission Granted  Share Information with NAME: Allah Reason     Permission granted to share info w Relationship: wife  Permission granted to share info w Contact Information: [phone]  Emotional Assessment Appearance:: Appears stated age Attitude/Demeanor/Rapport: Gracious, Engaged Affect (typically observed): Accepting Orientation: : Oriented to Self, Oriented to Place, Oriented to  Time, Oriented to Situation   Psych Involvement: No (comment)  Admission diagnosis:  Atrial fibrillation with RVR (HCC) [I48.91] New onset of congestive heart failure (HCC) [I50.9] Acute systolic CHF (congestive heart failure) (HCC) [I50.21] Patient Active Problem List   Diagnosis Date Noted   Permanent atrial fibrillation (HCC) 01/27/2021   SIRS (systemic inflammatory response syndrome) (HCC) 01/27/2021   HTN (hypertension) 01/27/2021   Hypokalemia 01/27/2021   Acute systolic CHF (congestive heart failure) (HCC) 01/27/2021   New onset of congestive heart failure (HCC) 01/26/2021   Acute CVA (cerebrovascular accident) (HCC) 08/07/2020   Acute upper respiratory infection 11/27/2014   Numbness of finger 09/19/2011   Hilar adenopathy    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HCC) 09/05/2010   Uncontrolled type 2 diabetes mellitus with microalbuminuria 04/23/2010   Renal insufficiency 03/21/2008   VISUAL ACUITY, DECREASED, LEFT EYE 03/19/2008   BELL'S PALSY, LEFT 01/30/2008   Morbid obesity (HCC) 01/20/2008   Malignant hypertension 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

## 2021-01-28 NOTE — Consult Note (Addendum)
Advanced Heart Failure Team Consult Note   Primary Physician: [name] PCP-Cardiologist:  None  Reason for Consultation: Heart Failure   HPI:    Randy [name] is seen today for evaluation of heart failure  at the request of Randy Randy [name].   Randy [name] is a 50 year old with a history of HFrEF, permanent A fib, HTN, hypertensive retinopathy, CKD Stage IIIa, hyperlipidemia, DMII, Bells palsy, CVA, OSA, and CVA. He never had a cath or cardioversion.   Admitted in 12/02/2019 with newly diagnosed A fib + ischemic CVA. He did not meet criteria for IV tPA. MRI showed large acute focal infarct, left frontal lobe predominantly ACA distribution. CT angiogram of the head and neck performed revealing no significant carotid, vertebral or intracranial stenosis. Echocardiogram obtained with bubble study showing no cardiac etiology for stroke. LVEF was 40 to 45%.Placed on Toprol XL. Creatinine on 12/04/20 1.7.   Followed at [name] for cardiology services. Saw Randy [name]. Set up for sleep study. Using CPAP 3 times a week. He has not been on lasix.   He was in his USOH until about 1 week ago. Developed cough and increased shortness of breath. No chest pain. He continued to work full time for [employer]. Lives with his wife and son.   Presented to [name] with increased shortness of breath. In A fib RVR. Given IV fluids in the ED . CXR with  pulmonary edema. Switched to IV lasix. Echo completed. EF down to 30-35% with speckled appears concerning for amyloid.Pertinent admission labs include: SARS 2 negative, BNP 318, HS Trop 43>45, WBC 13.6, hgb 12.1, HGb A1C 6.3, and creatinine 1.8.  BP treated with labetalol, hydralazine, and clonidine. Now off labetalol. BP remains elevated.   Remains short of breath with exertion.   Cardiac Testing Echo 01/27/21 EF 30-35% specked appearance concerning for amyloid. RV normal. LA severely dilated. Severe LVH.  Echo 11/2019 40-45% and severe  LVH  Review of Systems: [y] = yes, [ ]  = no   General: Weight gain [ Y]; Weight loss [ ] ; Anorexia [ ] ; Fatigue [ ] ; Fever [ ] ; Chills [ ] ; Weakness [ ]   Cardiac: Chest pain/pressure [ ] ; Resting SOB [ ] ; Exertional SOB [ Y]; Orthopnea [ Y]; Pedal Edema [ Y]; Palpitations [ ] ; Syncope [ ] ; Presyncope [ ] ; Paroxysmal nocturnal dyspnea[ ]   Pulmonary: Cough [ ] ; Wheezing[ ] ; Hemoptysis[ ] ; Sputum [ ] ; Snoring [ ]   GI: Vomiting[ ] ; Dysphagia[ ] ; Melena[ ] ; Hematochezia [ ] ; Heartburn[ ] ; Abdominal pain [ ] ; Constipation [ ] ; Diarrhea [ ] ; BRBPR [ ]   GU: Hematuria[ ] ; Dysuria [ ] ; Nocturia[ ]   Vascular: Pain in legs with walking [ ] ; Pain in feet with lying flat [ ] ; Non-healing sores [ ] ; Stroke [ Y]; TIA [ ] ; Slurred speech [ ] ;  Neuro: Headaches[ ] ; Vertigo[ ] ; Seizures[ ] ; Paresthesias[ ] ;Blurred vision [ ] ; Diplopia [ ] ; Vision changes [ ]   Ortho/Skin: Arthritis [ ] ; Joint pain [Y ]; Muscle pain [ ] ; Joint swelling [ ] ; Back Pain [ ] ; Rash [ ]   Psych: Depression[ ] ; Anxiety[ ]   Heme: Bleeding problems [ ] ; Clotting disorders [ ] ; Anemia [ ]   Endocrine: Diabetes [ Y]; Thyroid dysfunction[ ]   Home Medications Prior to Admission medications   Medication Sig Start Date End Date Taking? Authorizing Provider  apixaban (ELIQUIS) 5 MG TABS tablet Take 1 tablet (5 mg total) by mouth 2 (two) times daily. 08/12/20 08/07/21  Yes Rhetta Mura, MD  aspirin EC 81 MG tablet Take 243 mg by mouth daily as needed for mild pain. Swallow whole.   Yes [provider]  atorvastatin (LIPITOR) 20 MG tablet Take 20 mg by mouth at bedtime. 07/12/20  Yes [provider]  cloNIDine (CATAPRES) 0.2 MG tablet Take 0.2 mg by mouth 2 (two) times daily. 09/15/20  Yes [provider]  diltiazem (CARDIZEM CD) 240 MG 24 hr capsule Take 240 mg by mouth daily. 07/11/20  Yes [provider]  famotidine (PEPCID) 20 MG tablet Take 1 tablet (20 mg total) by mouth 2 (two) times daily. 09/25/15  Yes  Sharman Cheek, MD  glimepiride (AMARYL) 4 MG tablet Take 4 mg by mouth daily with breakfast.   Yes [provider]  hydrOXYzine (ATARAX/VISTARIL) 25 MG tablet Take 1 tablet (25 mg total) by mouth every 6 (six) hours. Patient taking differently: Take 25 mg by mouth every 6 (six) hours as needed for anxiety. 07/29/15  Yes Patel-Mills, Lorelle Formosa, PA-C  labetalol (NORMODYNE) 100 MG tablet Take 100 mg by mouth 2 (two) times daily.   Yes [provider]  latanoprost (XALATAN) 0.005 % ophthalmic solution Place 1 drop into both eyes at bedtime. 01/12/21  Yes [provider]  losartan (COZAAR) 100 MG tablet Take 100 mg by mouth daily. 01/01/21  Yes [provider]  metFORMIN (GLUCOPHAGE) 500 MG tablet Take 500 mg by mouth 2 (two) times daily with a meal. 02/29/20  Yes [provider]  metoprolol succinate (TOPROL-XL) 50 MG 24 hr tablet Take 50 mg by mouth daily. Take with or immediately following a meal.   Yes [provider]  NIFEdipine (ADALAT CC) 60 MG 24 hr tablet Take 1 tablet (60 mg total) by mouth daily. Take a half tablet daily for 3 days and then go back to full strength 08/08/20  Yes Rhetta Mura, MD  Vitamin D, Ergocalciferol, (DRISDOL) 1.25 MG (50000 UNIT) CAPS capsule Take 1 capsule by mouth every Monday. 07/25/20  Yes [provider]  glimepiride (AMARYL) 2 MG tablet TAKE ONE TABLET BY MOUTH ONCE DAILY BEFORE BREAKFAST Patient not taking: No sig reported 01/29/16   Eustaquio Boyden, MD  labetalol (NORMODYNE) 300 MG tablet Take 1 tablet (300 mg total) by mouth 2 (two) times daily. Patient not taking: Reported on 01/27/2021 01/29/16   Eustaquio Boyden, MD    Past Medical History: Past Medical History:  Diagnosis Date   CKD (chronic kidney disease) stage 3, GFR 30-59 ml/min (HCC)    baseline Cr 1.5-1.7   Decreased visual acuity    Left eye, resolved - hypertensive retinopathy   Diabetes mellitus 2012   dx hospitalization with  A1c 6.6%   Hilar adenopathy    on CT scan 02/2010, on rpt scan stable/improved.   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   Systolic murmur    Vitamin D deficiency     Past Surgical History: Past Surgical History:  Procedure Laterality Date   hospitalization  11/2008   malignant HTN, nl SPEP/UPEP, neg ANCA panel, nl C3/4, neg anti GBM Ab, neg Hep A/B/C, nl renal  US, nl PTH, neg HIV   US ECHOCARDIOGRAPHY  11/2008   LVsys fxn EF 50%, mild Randy, normal LV size, neg ANA, neg cryoglobulins   US ECHOCARDIOGRAPHY  2011   severe LVH, EF 40%, LA mildly dilated, PA pressure moderately increased    Family History: Family History  Problem Relation Age of Onset   Hypertension Mother    Hypertension Father    Diabetes Father    Hypertension Brother    Diabetes Sister    Coronary artery disease Neg Hx    Stroke Neg Hx    Cancer Neg Hx    Kidney disease Paternal Grandmother        ESRD    Social History: Social History   Socioeconomic History   Marital status: Married    Spouse name: Jung Yurchak   Number of children: 3   Years of education: Not on file   Highest education level: Bachelor's degree (e.g., BA, AB, BS)  Occupational History   Occupation: Tax Forensic psychologist    Comment: [employer]  Tobacco Use   Smoking status: Never   Smokeless tobacco: Never   Tobacco comments:    monthly, when going out  Vaping Use   Vaping Use: Never used  Substance and Sexual Activity   Alcohol use: Yes    Comment: Occasional   Drug use: No   Sexual activity: Not on file  Other Topics Concern   Not on file  Social History Narrative   Newly married, 2012, 1 daughter, 1 son   No injectable  steroid cycles   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, 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)   Regular exercise-yes   Social Determinants of Health   Financial Resource Strain: Low Risk    Difficulty of Paying Living Expenses: Not hard at all  Food Insecurity: No Food Insecurity   Worried About Charity fundraiser in the Last Year: Never true   Ran Out of Food in the Last Year: Never true  Transportation Needs: No Transportation Needs   Lack of Transportation (Medical): No   Lack of Transportation (Non-Medical): No  Physical Activity: Not on file  Stress: Not on file  Social Connections: Not on file    Allergies:  Allergies  Allergen Reactions   Fish-Derived Products Anaphylaxis    Objective:    Vital Signs:   Temp:  [97.9 F (36.6 C)-99.4 F (37.4 C)] 99.4 F (37.4 C) (10/04 0432) Pulse Rate:  [83-111] 89 (10/04 1003) Resp:  [14-28] 22 (10/04 0432) BP: (144-168)/(102-124) 160/109 (10/04 1003) SpO2:  [93 %-99 %] 98 % (10/04 0432) Weight:  [179.4 kg] 179.4 kg (10/04 0432) Last BM Date: 01/27/21  Weight change: Filed Weights   01/26/21 1912 01/28/21 0432  Weight: (!) 180.5 kg (!) 179.4 kg    Intake/Output:   Intake/Output Summary (Last 24 hours) at 01/28/2021 1108 Last data filed at 01/28/2021 0900 Gross per 24 hour  Intake 240 ml  Output 1975 ml  Net -1735 ml      Physical Exam    General:  No resp difficulty. Sitting on the side of the bed.  HEENT: normal Neck: supple. JVP 8-9. Carotids 2+ bilat; no bruits. No lymphadenopathy or thyromegaly appreciated. Cor: PMI nondisplaced. Irregular rate & rhythm. No rubs, gallops or murmurs. Lungs: clear Abdomen: soft, nontender, nondistended. No hepatosplenomegaly. No bruits or masses. Good bowel sounds. Extremities: no cyanosis, clubbing, rash, R and LLE 1+ edema Neuro: alert & orientedx3, cranial nerves grossly intact. moves all  4 extremities w/o difficulty. Affect pleasant   Telemetry   A fib 90-100s    EKG    A fib 103 bpm   Labs   Basic Metabolic Panel: Recent Labs  Lab 01/26/21 1917 01/27/21 0222 01/28/21 0928  NA 131* 131* 138  K 3.4* 3.6 3.6  CL 99 100 102  CO2 21* 21* 26  GLUCOSE 172* 201* 178*  BUN 17 21* 21*  CREATININE 1.81* 2.31* 1.83*  CALCIUM 8.8* 8.5* 9.1  MG 1.8  --   --     Liver Function Tests: No results for input(s): AST, ALT, ALKPHOS, BILITOT, PROT, ALBUMIN in the last 168 hours. No results for input(s): LIPASE, AMYLASE in the last 168 hours. No results for input(s): AMMONIA in the last 168 hours.  CBC: Recent Labs  Lab 01/26/21 1917 01/27/21 0222  WBC 13.6* 14.2*  HGB 12.1* 11.1*  HCT 37.6* 34.8*  MCV 80.5 80.6  PLT 330 306    Cardiac Enzymes: No results for input(s): CKTOTAL, CKMB, CKMBINDEX, TROPONINI in the last 168 hours.  BNP: BNP (last 3 results) Recent Labs    01/26/21 1942  BNP 318.4*    ProBNP (last 3 results) No results for input(s): PROBNP in the last 8760 hours.   CBG: Recent Labs  Lab 01/27/21 0753 01/27/21 1151 01/27/21 1844 01/27/21 2251 01/28/21 0600  GLUCAP 139* 149* 119* 144* 137*    Coagulation Studies: No results for input(s): LABPROT, INR in the last 72 hours.   Imaging   ECHOCARDIOGRAM COMPLETE  Result Date: 01/27/2021    ECHOCARDIOGRAM REPORT   Patient Name:   AFSHIN CHRYSTAL Date of Exam: 01/27/2021 Medical Rec #:  [id number]      Height:       80.0 in Accession #:    [id number]     Weight:       397.9 lb Date of Birth:  October 30, 1970      BSA:          3.080 m Patient Age:    50 years       BP:           151/114 mmHg Patient Gender: M              HR:           74 bpm. Exam Location:  Inpatient Procedure: 2D Echo, 3D Echo, Color Doppler and Cardiac Doppler Indications:    I50.21 Acute systolic (congestive) heart failure  History:        Patient has prior history of Echocardiogram examinations, most                 recent 12/04/2019. CHF, Arrythmias:Atrial Fibrillation; Risk                  Factors:Diabetes and Hypertension. Prior performed at [facility].  Sonographer:    Irving Burton Senior RDCS Referring Phys: [id number] VASUNDHRA RATHORE IMPRESSIONS  1. Myocardium with speckled appearance; consider amyloid.  2. Left ventricular ejection fraction, by estimation, is 30 to 35%. The left ventricle has moderately decreased function. The left ventricle demonstrates global hypokinesis. The left ventricular internal cavity size was mildly dilated. There is severe left ventricular hypertrophy. Left ventricular diastolic function could not be evaluated.  3. Right ventricular systolic function is normal. The right ventricular size is normal. There is moderately elevated pulmonary artery systolic pressure.  4. Left atrial size was severely dilated.  5. Right atrial size was mildly dilated.  6. The mitral valve is normal  in structure. Mild mitral valve regurgitation. No evidence of mitral stenosis.  7. The aortic valve is tricuspid. Aortic valve regurgitation is not visualized. No aortic stenosis is present.  8. Aortic dilatation noted. There is borderline dilatation of the aortic root, measuring 38 mm. There is mild dilatation of the ascending aorta, measuring 43 mm.  9. The inferior vena cava is dilated in size with <50% respiratory variability, suggesting right atrial pressure of 15 mmHg. FINDINGS  Left Ventricle: Left ventricular ejection fraction, by estimation, is 30 to 35%. The left ventricle has moderately decreased function. The left ventricle demonstrates global hypokinesis. The left ventricular internal cavity size was mildly dilated. There is severe left ventricular hypertrophy. Left ventricular diastolic function could not be evaluated due to atrial fibrillation. Left ventricular diastolic function could not be evaluated. Right Ventricle: The right ventricular size is normal. Right ventricular systolic function is normal. There is moderately elevated pulmonary artery systolic pressure. The tricuspid regurgitant  velocity is 2.87 m/s, and with an assumed right atrial pressure of 15 mmHg, the estimated right ventricular systolic pressure is 47.9 mmHg. Left Atrium: Left atrial size was severely dilated. Right Atrium: Right atrial size was mildly dilated. Pericardium: There is no evidence of pericardial effusion. Mitral Valve: The mitral valve is normal in structure. Mild mitral valve regurgitation. No evidence of mitral valve stenosis. Tricuspid Valve: The tricuspid valve is normal in structure. Tricuspid valve regurgitation is mild . No evidence of tricuspid stenosis. Aortic Valve: The aortic valve is tricuspid. Aortic valve regurgitation is not visualized. No aortic stenosis is present. Pulmonic Valve: The pulmonic valve was normal in structure. Pulmonic valve regurgitation is mild. No evidence of pulmonic stenosis. Aorta: Aortic dilatation noted. There is borderline dilatation of the aortic root, measuring 38 mm. There is mild dilatation of the ascending aorta, measuring 43 mm. Venous: The inferior vena cava is dilated in size with less than 50% respiratory variability, suggesting right atrial pressure of 15 mmHg. IAS/Shunts: No atrial level shunt detected by color flow Doppler. Additional Comments: Myocardium with speckled appearance; consider amyloid.  LEFT VENTRICLE PLAX 2D LVIDd:         5.80 cm LVIDs:         5.00 cm LV PW:         1.60 cm LV IVS:        1.50 cm LVOT diam:     2.50 cm  3D Volume EF: LV SV:         78       3D EF:        36 % LV SV Index:   25       LV EDV:       322 ml LVOT Area:     4.91 cm LV ESV:       207 ml                         LV SV:        115 ml RIGHT VENTRICLE RV S prime:     6.90 cm/s TAPSE (M-mode): 1.6 cm LEFT ATRIUM              Index       RIGHT ATRIUM           Index LA diam:        5.40 cm  1.75 cm/m  RA Area:     29.30 cm LA Vol (A2C):   145.0 ml 47.07  ml/m RA Volume:   105.00 ml 34.09 ml/m LA Vol (A4C):   151.0 ml 49.02 ml/m LA Biplane Vol: 156.0 ml 50.64 ml/m  AORTIC VALVE  LVOT Vmax:   85.10 cm/s LVOT Vmean:  64.933 cm/s LVOT VTI:    0.159 m  AORTA Ao Root diam: 3.80 cm Ao Asc diam:  4.30 cm TRICUSPID VALVE TR Peak grad:   32.9 mmHg TR Vmax:        287.00 cm/s  SHUNTS Systemic VTI:  0.16 m Systemic Diam: 2.50 cm Kirk Ruths MD Electronically signed by Kirk Ruths MD Signature Date/Time: 01/27/2021/1:34:19 PM    Final      Medications:     Current Medications:  apixaban  5 mg Oral BID   atorvastatin  20 mg Oral QHS   cloNIDine  0.2 mg Oral BID   diltiazem  240 mg Oral Daily   famotidine  20 mg Oral BID   furosemide  40 mg Intravenous Q12H   insulin aspart  0-5 Units Subcutaneous QHS   insulin aspart  0-9 Units Subcutaneous TID WC   latanoprost  1 drop Both Eyes QHS   metoprolol succinate  50 mg Oral Daily    Infusions:     Patient Profile    Randy Denner is a 50 year old with a history of HFrEF, permanent A fib, HTN, hypertensive retinopathy, CKD Stage IIIa, hyperlipidemia, DMII, Bells palsy, CVA, OSA, and CVA.   Admitted with A/C HFeEF & A Fib RVR.  Assessment/Plan   A/C HFrEF No previous history of coronary disease. Suspect HTN cardiomyopathy. ECHO this admit EF 30-35% with severe LVH and speckled appearance. Doubt amyloid with uncontrolled hypertension.  - Will need RHC/LHC to further assess.  - Continue IV lasix today then stop.  - Remains hypertensive. Creatinine 1.8  - Continue Toprol XL 50 mg daily -No Arb/arni dig spiro with elevated creatinine.  - Add 1/2 tab bidil tid. - Add ted hose.    2. Uncontrolled Hypertension   -Remains hypertensive. Creatinine 1.8  - Start to wean clonidine with reduced EF.  - Continue Toprol XL 50 mg daily -No Arb/arni dig spiro with elevated creatinine.  - Add 1/2 tab bidil tid.  3. A Fib RVR -Developed A fib 2021. Has never had cardioversion.  - Rate 90-100s . Start amio drip.  -Continue eliquis 5 mg twice a day.  - Check TSH  4. . CKD Stage IIIb  -Creatinine baseline 1.7-1.9 - Creatinine  1.7>2.3>1.8  -Followed by twice a year by Nephrology in [place].   5 DMII -Hgb A1C 6.3 - On SSI.  - Off amaryl with reduced EF.  - Eventually will add SGLT2i  6. OSA -Using CPAP 3 times a week.  -Discussed and needs to use CPAP nightly.   7. Anemia  Iron Sats 6%  Will need IV iron.   Consult cardiac rehab.   RHC/LHC tomorrow at [facility].   Length of Stay: 1  Amy Clegg, NP  01/28/2021, 11:08 AM  Advanced Heart Failure Team Pager [phone] (M-F; 7a - 5p)  Please contact [facility] for night-coverage after hours (4p -7a ) and weekends on [email or web address]  Patient seen and examined with the above-signed Advanced Practice Provider and/or Housestaff. I personally reviewed laboratory data, imaging studies and relevant notes. I independently examined the patient and formulated the important aspects of the plan. I have edited the note to reflect any of my changes or salient points. I have personally discussed the plan with the patient and/or  family.  50 y/o male with severe HTN, chronic AF (since 8/21), CKD 3a/b, OSA, DM2 whom we are asked to see for progressive systolic HF.   Admitted with recurrent HF. Echo EF 30-35% with ? Of cardiac amyloidosis.   UA > 300 protein   General: Sitting up in bed.  No resp difficulty HEENT: normal Neck: supple. JVP elevated. Carotids 2+ bilat; no bruits. No lymphadenopathy or thryomegaly appreciated. Cor: PMI nondisplaced. Irregular rate & rhythm. No rubs, gallops or murmurs. Lungs: clear Abdomen: soft, nontender, nondistended. No hepatosplenomegaly. No bruits or masses. Good bowel sounds. Extremities: no cyanosis, clubbing, rash, 1+ edema Neuro: alert & orientedx3, cranial nerves grossly intact. moves all 4 extremities w/o difficulty. Affect pleasant  Multiple issues. Suspect HF (and CKD) likely due to uncontrolled HTN + DM2. Echo not felt to be c/w amyloid. Will plan R/L cath tomorrow. Will then attempt to get him back in NSR (with consideration  of AF ablation) Switch anti-HTN to GDMT (I.e. wean clonidine) .   Glori Bickers, MD  3:53 PM

## 2021-01-29 ENCOUNTER — Encounter (HOSPITAL_COMMUNITY)
Admission: EM | Disposition: A | Discharge status: Home / Self Care | Payer: Self-pay | Source: Home / Self Care | Attending: Internal Medicine

## 2021-01-29 ENCOUNTER — Encounter (HOSPITAL_COMMUNITY): Payer: Self-pay | Admitting: Internal Medicine

## 2021-01-29 DIAGNOSIS — E876 Hypokalemia: Secondary | ICD-10-CM

## 2021-01-29 DIAGNOSIS — I4891 Unspecified atrial fibrillation: Secondary | ICD-10-CM | POA: Diagnosis not present

## 2021-01-29 DIAGNOSIS — I110 Hypertensive heart disease with heart failure: Secondary | ICD-10-CM | POA: Diagnosis not present

## 2021-01-29 DIAGNOSIS — N1832 Chronic kidney disease, stage 3b: Secondary | ICD-10-CM | POA: Diagnosis not present

## 2021-01-29 DIAGNOSIS — I2510 Atherosclerotic heart disease of native coronary artery without angina pectoris: Secondary | ICD-10-CM

## 2021-01-29 DIAGNOSIS — I5023 Acute on chronic systolic (congestive) heart failure: Secondary | ICD-10-CM | POA: Diagnosis not present

## 2021-01-29 HISTORY — PX: RIGHT/LEFT HEART CATH AND CORONARY ANGIOGRAPHY: CATH118266

## 2021-01-29 LAB — POCT I-STAT EG7
Acid-Base Excess: 1 mmol/L (ref 0.0–2.0)
Acid-Base Excess: 2 mmol/L (ref 0.0–2.0)
Bicarbonate: 24.7 mmol/L (ref 20.0–28.0)
Bicarbonate: 26.6 mmol/L (ref 20.0–28.0)
Calcium, Ion: 1.04 mmol/L — ABNORMAL LOW (ref 1.15–1.40)
Calcium, Ion: 1.15 mmol/L (ref 1.15–1.40)
HCT: 34 % — ABNORMAL LOW (ref 39.0–52.0)
HCT: 35 % — ABNORMAL LOW (ref 39.0–52.0)
Hemoglobin: 11.6 g/dL — ABNORMAL LOW (ref 13.0–17.0)
Hemoglobin: 11.9 g/dL — ABNORMAL LOW (ref 13.0–17.0)
O2 Saturation: 65 %
O2 Saturation: 65 %
Potassium: 3 mmol/L — ABNORMAL LOW (ref 3.5–5.1)
Potassium: 3.3 mmol/L — ABNORMAL LOW (ref 3.5–5.1)
Sodium: 143 mmol/L (ref 135–145)
Sodium: 145 mmol/L (ref 135–145)
TCO2: 26 mmol/L (ref 22–32)
TCO2: 28 mmol/L (ref 22–32)
pCO2, Ven: 37.3 mmHg — ABNORMAL LOW (ref 44.0–60.0)
pCO2, Ven: 39.1 mmHg — ABNORMAL LOW (ref 44.0–60.0)
pH, Ven: 7.43 (ref 7.250–7.430)
pH, Ven: 7.441 — ABNORMAL HIGH (ref 7.250–7.430)
pO2, Ven: 32 mmHg (ref 32.0–45.0)
pO2, Ven: 32 mmHg (ref 32.0–45.0)

## 2021-01-29 LAB — GLUCOSE, CAPILLARY
Glucose-Capillary: 140 mg/dL — ABNORMAL HIGH (ref 70–99)
Glucose-Capillary: 156 mg/dL — ABNORMAL HIGH (ref 70–99)
Glucose-Capillary: 190 mg/dL — ABNORMAL HIGH (ref 70–99)
Glucose-Capillary: 197 mg/dL — ABNORMAL HIGH (ref 70–99)

## 2021-01-29 LAB — POCT I-STAT 7, (LYTES, BLD GAS, ICA,H+H)
Acid-Base Excess: 0 mmol/L (ref 0.0–2.0)
Bicarbonate: 24 mmol/L (ref 20.0–28.0)
Calcium, Ion: 1.06 mmol/L — ABNORMAL LOW (ref 1.15–1.40)
HCT: 33 % — ABNORMAL LOW (ref 39.0–52.0)
Hemoglobin: 11.2 g/dL — ABNORMAL LOW (ref 13.0–17.0)
O2 Saturation: 94 %
Potassium: 3.1 mmol/L — ABNORMAL LOW (ref 3.5–5.1)
Sodium: 145 mmol/L (ref 135–145)
TCO2: 25 mmol/L (ref 22–32)
pCO2 arterial: 34.3 mmHg (ref 32.0–48.0)
pH, Arterial: 7.453 — ABNORMAL HIGH (ref 7.350–7.450)
pO2, Arterial: 66 mmHg — ABNORMAL LOW (ref 83.0–108.0)

## 2021-01-29 LAB — BASIC METABOLIC PANEL
Anion gap: 9 (ref 5–15)
BUN: 24 mg/dL — ABNORMAL HIGH (ref 6–20)
CO2: 25 mmol/L (ref 22–32)
Calcium: 8.7 mg/dL — ABNORMAL LOW (ref 8.9–10.3)
Chloride: 103 mmol/L (ref 98–111)
Creatinine, Ser: 1.85 mg/dL — ABNORMAL HIGH (ref 0.61–1.24)
GFR, Estimated: 44 mL/min — ABNORMAL LOW (ref >60)
Glucose, Bld: 140 mg/dL — ABNORMAL HIGH (ref 70–99)
Potassium: 3.3 mmol/L — ABNORMAL LOW (ref 3.5–5.1)
Sodium: 137 mmol/L (ref 135–145)

## 2021-01-29 SURGERY — RIGHT/LEFT HEART CATH AND CORONARY ANGIOGRAPHY
Anesthesia: LOCAL

## 2021-01-29 MED ORDER — MIDAZOLAM HCL 2 MG/2ML IJ SOLN
INTRAMUSCULAR | Status: AC
  Filled 2021-01-29: qty 2

## 2021-01-29 MED ORDER — APIXABAN 5 MG PO TABS
5.0000 mg | ORAL_TABLET | Freq: Two times a day (BID) | ORAL | Status: DC
  Administered 2021-01-29 – 2021-01-31 (×4): 5 mg via ORAL
  Filled 2021-01-29 (×5): qty 1

## 2021-01-29 MED ORDER — ISOSORB DINITRATE-HYDRALAZINE 20-37.5 MG PO TABS
1.0000 | ORAL_TABLET | Freq: Three times a day (TID) | ORAL | Status: DC
  Administered 2021-01-29 – 2021-01-31 (×7): 1 via ORAL
  Filled 2021-01-29 (×8): qty 1

## 2021-01-29 MED ORDER — LABETALOL HCL 5 MG/ML IV SOLN: 10.0000 mg | INTRAVENOUS | Status: AC | PRN

## 2021-01-29 MED ORDER — FENTANYL CITRATE (PF) 100 MCG/2ML IJ SOLN
INTRAMUSCULAR | Status: DC | PRN
  Administered 2021-01-29: 25 ug via INTRAVENOUS

## 2021-01-29 MED ORDER — SPIRONOLACTONE 12.5 MG HALF TABLET
12.5000 mg | ORAL_TABLET | Freq: Every day | ORAL | Status: DC
  Administered 2021-01-29 – 2021-01-30 (×2): 12.5 mg via ORAL
  Filled 2021-01-29 (×2): qty 1

## 2021-01-29 MED ORDER — LIDOCAINE HCL (PF) 1 % IJ SOLN
INTRAMUSCULAR | Status: AC
  Filled 2021-01-29: qty 30

## 2021-01-29 MED ORDER — IRBESARTAN 300 MG PO TABS
150.0000 mg | ORAL_TABLET | Freq: Every day | ORAL | Status: DC
  Administered 2021-01-29 – 2021-01-30 (×2): 150 mg via ORAL
  Filled 2021-01-29 (×2): qty 1

## 2021-01-29 MED ORDER — SODIUM CHLORIDE 0.9% FLUSH
3.0000 mL | Freq: Two times a day (BID) | INTRAVENOUS | Status: DC
  Administered 2021-01-29 – 2021-01-30 (×4): 3 mL via INTRAVENOUS

## 2021-01-29 MED ORDER — ACETAMINOPHEN 325 MG PO TABS
650.0000 mg | ORAL_TABLET | ORAL | Status: DC | PRN
Start: 2021-01-29 — End: 2021-01-30
  Administered 2021-01-29 – 2021-01-30 (×3): 650 mg via ORAL
  Filled 2021-01-29 (×3): qty 2

## 2021-01-29 MED ORDER — HYDRALAZINE HCL 20 MG/ML IJ SOLN
10.0000 mg | INTRAMUSCULAR | Status: AC | PRN
  Administered 2021-01-29: 10 mg via INTRAVENOUS
  Filled 2021-01-29: qty 1

## 2021-01-29 MED ORDER — IOHEXOL 350 MG/ML SOLN
INTRAVENOUS | Status: DC | PRN
  Administered 2021-01-29: 30 mL

## 2021-01-29 MED ORDER — LIDOCAINE HCL (PF) 1 % IJ SOLN
INTRAMUSCULAR | Status: DC | PRN
  Administered 2021-01-29 (×2): 2 mL

## 2021-01-29 MED ORDER — VERAPAMIL HCL 2.5 MG/ML IV SOLN
INTRAVENOUS | Status: DC | PRN
  Administered 2021-01-29: 10 mL via INTRA_ARTERIAL

## 2021-01-29 MED ORDER — ONDANSETRON HCL 4 MG/2ML IJ SOLN
4.0000 mg | Freq: Four times a day (QID) | INTRAMUSCULAR | Status: DC | PRN
  Administered 2021-01-31: 4 mg via INTRAVENOUS
  Filled 2021-01-29: qty 2

## 2021-01-29 MED ORDER — IRBESARTAN 75 MG PO TABS: 75.0000 mg | ORAL_TABLET | Freq: Every day | ORAL | Status: DC

## 2021-01-29 MED ORDER — SODIUM CHLORIDE 0.9% FLUSH: 3.0000 mL | INTRAVENOUS | Status: DC | PRN

## 2021-01-29 MED ORDER — HEPARIN (PORCINE) IN NACL 1000-0.9 UT/500ML-% IV SOLN
INTRAVENOUS | Status: AC
  Filled 2021-01-29: qty 1000

## 2021-01-29 MED ORDER — AMIODARONE HCL IN DEXTROSE 360-4.14 MG/200ML-% IV SOLN
INTRAVENOUS | Status: AC
  Filled 2021-01-29: qty 200

## 2021-01-29 MED ORDER — VERAPAMIL HCL 2.5 MG/ML IV SOLN
INTRAVENOUS | Status: AC
  Filled 2021-01-29: qty 2

## 2021-01-29 MED ORDER — POTASSIUM CHLORIDE CRYS ER 20 MEQ PO TBCR
40.0000 meq | EXTENDED_RELEASE_TABLET | Freq: Once | ORAL | Status: AC
  Administered 2021-01-29: 40 meq via ORAL
  Filled 2021-01-29: qty 2

## 2021-01-29 MED ORDER — FENTANYL CITRATE (PF) 100 MCG/2ML IJ SOLN
INTRAMUSCULAR | Status: AC
  Filled 2021-01-29: qty 2

## 2021-01-29 MED ORDER — HEPARIN SODIUM (PORCINE) 1000 UNIT/ML IJ SOLN
INTRAMUSCULAR | Status: DC | PRN
  Administered 2021-01-29: 7000 [IU] via INTRAVENOUS

## 2021-01-29 MED ORDER — SODIUM CHLORIDE 0.9 % IV SOLN: 250.0000 mL | INTRAVENOUS | Status: DC | PRN

## 2021-01-29 MED ORDER — HEPARIN (PORCINE) IN NACL 1000-0.9 UT/500ML-% IV SOLN
INTRAVENOUS | Status: DC | PRN
  Administered 2021-01-29 (×2): 500 mL

## 2021-01-29 MED ORDER — HEPARIN SODIUM (PORCINE) 1000 UNIT/ML IJ SOLN
INTRAMUSCULAR | Status: AC
  Filled 2021-01-29: qty 1

## 2021-01-29 MED ORDER — EMPAGLIFLOZIN 10 MG PO TABS
10.0000 mg | ORAL_TABLET | Freq: Every day | ORAL | Status: DC
  Administered 2021-01-30 – 2021-01-31 (×2): 10 mg via ORAL
  Filled 2021-01-29 (×2): qty 1

## 2021-01-29 MED ORDER — MIDAZOLAM HCL 2 MG/2ML IJ SOLN
INTRAMUSCULAR | Status: DC | PRN
  Administered 2021-01-29: 2 mg via INTRAVENOUS

## 2021-01-29 MED ORDER — SODIUM CHLORIDE 0.9 % IV SOLN: INTRAVENOUS | Status: AC

## 2021-01-29 MED ORDER — FUROSEMIDE 10 MG/ML IJ SOLN
80.0000 mg | Freq: Two times a day (BID) | INTRAMUSCULAR | Status: DC
  Administered 2021-01-29 – 2021-01-30 (×3): 80 mg via INTRAVENOUS
  Filled 2021-01-29 (×3): qty 8

## 2021-01-29 SURGICAL SUPPLY — 12 items
CATH 5FR JL3.5 JR4 ANG PIG MP (CATHETERS) ×1 IMPLANT
CATH BALLN WEDGE 5F 110CM (CATHETERS) ×1 IMPLANT
CATH INFINITI 5F FL5 125 (CATHETERS) ×1 IMPLANT
CATH INFINITI 5FR JR4 125CM (CATHETERS) ×1 IMPLANT
DEVICE RAD COMP TR BAND LRG (VASCULAR PRODUCTS) ×1 IMPLANT
GLIDESHEATH SLEND SS 6F .021 (SHEATH) ×1 IMPLANT
GUIDEWIRE INQWIRE 1.5J.035X260 (WIRE) IMPLANT
INQWIRE 1.5J .035X260CM (WIRE) ×2
KIT HEART LEFT (KITS) ×1 IMPLANT
PACK CARDIAC CATHETERIZATION (CUSTOM PROCEDURE TRAY) ×2 IMPLANT
SHEATH GLIDE SLENDER 4/5FR (SHEATH) ×1 IMPLANT
TRANSDUCER W/STOPCOCK (MISCELLANEOUS) ×2 IMPLANT

## 2021-01-29 NOTE — Progress Notes (Signed)
PROGRESS NOTE    Randy [name]  WVP:[id number] DOB: [birth year] DOA: 01/26/2021 PCP: [facility]    Chief Complaint  Patient presents with   Shortness of Breath    Brief Narrative:  50 year old male with history of chronic systolic CHF, permanent atrial fibrillation on Eliquis, hypertension, hypertensive retinopathy, CKD stage III, hyperlipidemia, diabetes mellitus type 2, history of Bell's palsy, OSA, prior strokes came to ED with worsening shortness of breath.  BNP was 318.  Chest x-ray showed pulmonary edema.   Assessment & Plan:   Principal Problem:   New onset of congestive heart failure (HCC) Active Problems:   Permanent atrial fibrillation (HCC)   SIRS (systemic inflammatory response syndrome) (HCC)   HTN (hypertension)   Hypokalemia   Acute systolic CHF (congestive heart failure) (HCC)   Acute on chronic systolic CHF:  Diuresing with IV lasix 40 mg EVERY 12 HOURS .  Cardiology on board.  Echocardiogram showing reduced LVef of 30 to 35%,global hypokinesis.  Currently on bidil and lasix.  On metoprolol 50 mg daily. Diuresed about 2 lit in the last 24 hours.  Plan for left and right heart cath today.    Hypertension:  Not well controlled.  On clonidine, metoprolol and bidil.    Hyperlipidemia:  Resume statin.    Hypokalemia:  Replaced.    Permanent atrial fibrillation  Rate controlled.  On amiodarone for rate control and on eliquis for anti coagulation.   Leukocytosis:  Unclear etiology.  Probably reactive.  CXR showed pulm edema.  No urinary symptoms.     Diabetes Mellitus:  Non insulin dependent,  On metformin and glimepiride at home which are on hold for now.  CBG (last 3)  Recent Labs    01/28/21 1645 01/28/21 2151 01/29/21 0655  GLUCAP 142* 261* 140*   Last A1c is 6.3%.     GERD Stable.    Stage 3 a CKD: Creatinine at baseline around 1.8.    Elevated troponin from demand ischemia in the setting of decompensated  CHF and atrial fib.      DVT prophylaxis: (Eliquis) Code Status: (Full code) Family Communication: none at bedside.  Disposition:   Status is: Inpatient  Remains inpatient appropriate because:Ongoing diagnostic testing needed not appropriate for outpatient work up and IV treatments appropriate due to intensity of illness or inability to take PO  Dispo: The patient is from: Home              Anticipated d/c is to: Home              Patient currently is not medically stable to d/c.   Difficult to place patient No       Consultants:   Cardiology.   Procedures: Right and left heart cath.   Antimicrobials: none   Subjective: No chest pain or sob.   Objective: Vitals:   01/28/21 2006 01/29/21 0434 01/29/21 0849 01/29/21 1048  BP: (!) 144/114 (!) 159/110 (!) 163/123   Pulse: 90 85 87   Resp: 20 (!) 23 16   Temp: 98.7 F (37.1 C) 98.6 F (37 C)    TempSrc: Oral Oral    SpO2: 97% 98% 99% 97%  Weight:  (!) 176.4 kg    Height:        Intake/Output Summary (Last 24 hours) at 01/29/2021 1124 Last data filed at 01/29/2021 0654 Gross per 24 hour  Intake 1260.21 ml  Output 2101 ml  Net -840.79 ml   Filed Weights   01/26/21 1912  01/28/21 0432 01/29/21 0434  Weight: (!) 180.5 kg (!) 179.4 kg (!) 176.4 kg    Examination:  General exam: Appears calm and comfortable  Respiratory system: Clear to auscultation. Respiratory effort normal. Cardiovascular system: S1 & S2 heard, irregularly irregular, . JVP Gastrointestinal system: Abdomen is nondistended, soft and nontender. Normal bowel sounds heard. Central nervous system: Alert and oriented. No focal neurological deficits. Extremities: Symmetric 5 x 5 power. Skin: No rashes, lesions or ulcers Psychiatry:  Mood & affect appropriate.     Data Reviewed: I have personally reviewed following labs and imaging studies  CBC: Recent Labs  Lab 01/26/21 1917 01/27/21 0222  WBC 13.6* 14.2*  HGB 12.1* 11.1*  HCT 37.6*  34.8*  MCV 80.5 80.6  PLT 330 306    Basic Metabolic Panel: Recent Labs  Lab 01/26/21 1917 01/27/21 0222 01/28/21 0928 01/29/21 0354  NA 131* 131* 138 137  K 3.4* 3.6 3.6 3.3*  CL 99 100 102 103  CO2 21* 21* 26 25  GLUCOSE 172* 201* 178* 140*  BUN 17 21* 21* 24*  CREATININE 1.81* 2.31* 1.83* 1.85*  CALCIUM 8.8* 8.5* 9.1 8.7*  MG 1.8  --   --   --     GFR: Estimated Creatinine Clearance: 88.5 mL/min (A) (by C-G formula based on SCr of 1.85 mg/dL (H)).  Liver Function Tests: No results for input(s): AST, ALT, ALKPHOS, BILITOT, PROT, ALBUMIN in the last 168 hours.  CBG: Recent Labs  Lab 01/28/21 0600 01/28/21 1150 01/28/21 1645 01/28/21 2151 01/29/21 0655  GLUCAP 137* 157* 142* 261* 140*     Recent Results (from the past 240 hour(s))  Resp Panel by RT-PCR (Flu A&B, Covid) Nasopharyngeal Swab     Status: None   Collection Time: 01/26/21  7:24 PM   Specimen: Nasopharyngeal Swab; Nasopharyngeal(NP) swabs in vial transport medium  Result Value Ref Range Status   SARS Coronavirus 2 by RT PCR NEGATIVE NEGATIVE Final    Comment: (NOTE) SARS-CoV-2 target nucleic acids are NOT DETECTED.  The SARS-CoV-2 RNA is generally detectable in upper respiratory specimens during the acute phase of infection. The lowest concentration of SARS-CoV-2 viral copies this assay can detect is 138 copies/mL. A negative result does not preclude SARS-Cov-2 infection and should not be used as the sole basis for treatment or other patient management decisions. A negative result may occur with  improper specimen collection/handling, submission of specimen other than nasopharyngeal swab, presence of viral mutation(s) within the areas targeted by this assay, and inadequate number of viral copies(<138 copies/mL). A negative result must be combined with clinical observations, patient history, and epidemiological information. The expected result is Negative.  Fact Sheet for Patients:   BloggerCourse.com  Fact Sheet for Healthcare Providers:  SeriousBroker.it  This test is no t yet approved or cleared by the [place] FDA and  has been authorized for detection and/or diagnosis of SARS-CoV-2 by FDA under an Emergency Use Authorization (EUA). This EUA will remain  in effect (meaning this test can be used) for the duration of the COVID-19 declaration under Section 564(b)(1) of the Act, 21 U.S.C.section 360bbb-3(b)(1), unless the authorization is terminated  or revoked sooner.       Influenza A by PCR NEGATIVE NEGATIVE Final   Influenza B by PCR NEGATIVE NEGATIVE Final    Comment: (NOTE) The Xpert Xpress SARS-CoV-2/FLU/RSV plus assay is intended as an aid in the diagnosis of influenza from Nasopharyngeal swab specimens and should not be used as a sole basis for  treatment. Nasal washings and aspirates are unacceptable for Xpert Xpress SARS-CoV-2/FLU/RSV testing.  Fact Sheet for Patients: BloggerCourse.com  Fact Sheet for Healthcare Providers: SeriousBroker.it  This test is not yet approved or cleared by the [place] FDA and has been authorized for detection and/or diagnosis of SARS-CoV-2 by FDA under an Emergency Use Authorization (EUA). This EUA will remain in effect (meaning this test can be used) for the duration of the COVID-19 declaration under Section 564(b)(1) of the Act, 21 U.S.C. section 360bbb-3(b)(1), unless the authorization is terminated or revoked.  Performed at [facility], [address] [place], [place] [address]          Radiology Studies: ECHOCARDIOGRAM COMPLETE  Result Date: 01/27/2021    ECHOCARDIOGRAM REPORT   Patient Name:   AMIEL TOELLE Date of Exam: 01/27/2021 Medical Rec #:  [id number]      Height:       80.0 in Accession #:    [id number]     Weight:       397.9 lb Date of Birth:  [birth year]      BSA:           3.080 m Patient Age:    50 years       BP:           151/114 mmHg Patient Gender: M              HR:           74 bpm. Exam Location:  Inpatient Procedure: 2D Echo, 3D Echo, Color Doppler and Cardiac Doppler Indications:    I50.21 Acute systolic (congestive) heart failure  History:        Patient has prior history of Echocardiogram examinations, most                 recent 12/04/2019. CHF, Arrythmias:Atrial Fibrillation; Risk                 Factors:Diabetes and Hypertension. Prior performed at [facility].  Sonographer:    Irving Burton Senior RDCS Referring Phys: [id number] VASUNDHRA RATHORE IMPRESSIONS  1. Myocardium with speckled appearance; consider amyloid.  2. Left ventricular ejection fraction, by estimation, is 30 to 35%. The left ventricle has moderately decreased function. The left ventricle demonstrates global hypokinesis. The left ventricular internal cavity size was mildly dilated. There is severe left ventricular hypertrophy. Left ventricular diastolic function could not be evaluated.  3. Right ventricular systolic function is normal. The right ventricular size is normal. There is moderately elevated pulmonary artery systolic pressure.  4. Left atrial size was severely dilated.  5. Right atrial size was mildly dilated.  6. The mitral valve is normal in structure. Mild mitral valve regurgitation. No evidence of mitral stenosis.  7. The aortic valve is tricuspid. Aortic valve regurgitation is not visualized. No aortic stenosis is present.  8. Aortic dilatation noted. There is borderline dilatation of the aortic root, measuring 38 mm. There is mild dilatation of the ascending aorta, measuring 43 mm.  9. The inferior vena cava is dilated in size with <50% respiratory variability, suggesting right atrial pressure of 15 mmHg. FINDINGS  Left Ventricle: Left ventricular ejection fraction, by estimation, is 30 to 35%. The left ventricle has moderately decreased function. The left ventricle demonstrates global hypokinesis. The  left ventricular internal cavity size was mildly dilated. There is severe left ventricular hypertrophy. Left ventricular diastolic function could not be evaluated due to atrial fibrillation. Left ventricular diastolic function could not be evaluated. Right Ventricle:  The right ventricular size is normal. Right ventricular systolic function is normal. There is moderately elevated pulmonary artery systolic pressure. The tricuspid regurgitant velocity is 2.87 m/s, and with an assumed right atrial pressure of 15 mmHg, the estimated right ventricular systolic pressure is 47.9 mmHg. Left Atrium: Left atrial size was severely dilated. Right Atrium: Right atrial size was mildly dilated. Pericardium: There is no evidence of pericardial effusion. Mitral Valve: The mitral valve is normal in structure. Mild mitral valve regurgitation. No evidence of mitral valve stenosis. Tricuspid Valve: The tricuspid valve is normal in structure. Tricuspid valve regurgitation is mild . No evidence of tricuspid stenosis. Aortic Valve: The aortic valve is tricuspid. Aortic valve regurgitation is not visualized. No aortic stenosis is present. Pulmonic Valve: The pulmonic valve was normal in structure. Pulmonic valve regurgitation is mild. No evidence of pulmonic stenosis. Aorta: Aortic dilatation noted. There is borderline dilatation of the aortic root, measuring 38 mm. There is mild dilatation of the ascending aorta, measuring 43 mm. Venous: The inferior vena cava is dilated in size with less than 50% respiratory variability, suggesting right atrial pressure of 15 mmHg. IAS/Shunts: No atrial level shunt detected by color flow Doppler. Additional Comments: Myocardium with speckled appearance; consider amyloid.  LEFT VENTRICLE PLAX 2D LVIDd:         5.80 cm LVIDs:         5.00 cm LV PW:         1.60 cm LV IVS:        1.50 cm LVOT diam:     2.50 cm  3D Volume EF: LV SV:         78       3D EF:        36 % LV SV Index:   25       LV EDV:       322  ml LVOT Area:     4.91 cm LV ESV:       207 ml                         LV SV:        115 ml RIGHT VENTRICLE RV S prime:     6.90 cm/s TAPSE (M-mode): 1.6 cm LEFT ATRIUM              Index       RIGHT ATRIUM           Index LA diam:        5.40 cm  1.75 cm/m  RA Area:     29.30 cm LA Vol (A2C):   145.0 ml 47.07 ml/m RA Volume:   105.00 ml 34.09 ml/m LA Vol (A4C):   151.0 ml 49.02 ml/m LA Biplane Vol: 156.0 ml 50.64 ml/m  AORTIC VALVE LVOT Vmax:   85.10 cm/s LVOT Vmean:  64.933 cm/s LVOT VTI:    0.159 m  AORTA Ao Root diam: 3.80 cm Ao Asc diam:  4.30 cm TRICUSPID VALVE TR Peak grad:   32.9 mmHg TR Vmax:        287.00 cm/s  SHUNTS Systemic VTI:  0.16 m Systemic Diam: 2.50 cm Olga Millers MD Electronically signed by Olga Millers MD Signature Date/Time: 01/27/2021/1:34:19 PM    Final         Scheduled Meds:  [MAR Hold] atorvastatin  20 mg Oral QHS   [MAR Hold] cloNIDine  0.1 mg Oral BID   [MAR Hold] famotidine  20 mg Oral BID   [MAR Hold] insulin aspart  0-5 Units Subcutaneous QHS   [MAR Hold] insulin aspart  0-9 Units Subcutaneous TID WC   [MAR Hold] isosorbide-hydrALAZINE  1 tablet Oral TID   [MAR Hold] latanoprost  1 drop Both Eyes QHS   [MAR Hold] metoprolol succinate  50 mg Oral Daily   [MAR Hold] sodium chloride flush  3 mL Intravenous Q12H   Continuous Infusions:  sodium chloride     sodium chloride 10 mL/hr at 01/29/21 0654   amiodarone 30 mg/hr (01/29/21 1104)     LOS: 2 days        Kathlen Mody, MD [facility]   To contact the attending provider between 7A-7P or the covering provider during after hours 7P-7A, please log into the web site [email or web address] and access using universal [place] password for that web site. If you do not have the password, please call the hospital operator.  01/29/2021, 11:24 AM

## 2021-01-29 NOTE — Interval H&P Note (Signed)
History and Physical Interval Note:  01/29/2021 10:11 AM  Randy [name]  has presented today for surgery, with the diagnosis of heart failure.  The various methods of treatment have been discussed with the patient and family. After consideration of risks, benefits and other options for treatment, the patient has consented to  Procedure(s): RIGHT/LEFT HEART CATH AND CORONARY ANGIOGRAPHY (N/A) and possible coronary angioplasty as a surgical intervention.  The patient's history has been reviewed, patient examined, no change in status, stable for surgery.  I have reviewed the patient's chart and labs.  Questions were answered to the patient's satisfaction.     Cato Liburd

## 2021-01-29 NOTE — Progress Notes (Signed)
Advanced Heart Failure Rounding Note   Subjective:    Diuresing well. Weight down 6 pounds overnight. Denies SOB, orthopnea or PND.   On IV amio for AF. Rates improved. Eliquis held for cath today   Objective:   Weight Range:  Vital Signs:   Temp:  [98.6 F (37 C)-99 F (37.2 C)] 98.6 F (37 C) (10/05 0434) Pulse Rate:  [70-90] 85 (10/05 0434) Resp:  [18-23] 23 (10/05 0434) BP: (140-160)/(106-114) 159/110 (10/05 0434) SpO2:  [97 %-98 %] 98 % (10/05 0434) Weight:  [176.4 kg] 176.4 kg (10/05 0434) Last BM Date: 01/27/21  Weight change: Filed Weights   01/26/21 1912 01/28/21 0432 01/29/21 0434  Weight: (!) 180.5 kg (!) 179.4 kg (!) 176.4 kg    Intake/Output:   Intake/Output Summary (Last 24 hours) at 01/29/2021 0851 Last data filed at 01/29/2021 0654 Gross per 24 hour  Intake 1500.21 ml  Output 2101 ml  Net -600.79 ml     Physical Exam: General:  Well appearing. No resp difficulty HEENT: normal Neck: supple. JVP 6-7. Carotids 2+ bilat; no bruits. No lymphadenopathy or thryomegaly appreciated. Cor: PMI nondisplaced. Irregular rate & rhythm. No rubs, gallops or murmurs. Lungs: clear Abdomen: soft, nontender, nondistended. No hepatosplenomegaly. No bruits or masses. Good bowel sounds. Extremities: no cyanosis, clubbing, rash, edema Neuro: alert & orientedx3, cranial nerves grossly intact. moves all 4 extremities w/o difficulty. Affect pleasant  Telemetry: AF 80-90s Personally reviewed   Labs: Basic Metabolic Panel: Recent Labs  Lab 01/26/21 1917 01/27/21 0222 01/28/21 0928 01/29/21 0354  NA 131* 131* 138 137  K 3.4* 3.6 3.6 3.3*  CL 99 100 102 103  CO2 21* 21* 26 25  GLUCOSE 172* 201* 178* 140*  BUN 17 21* 21* 24*  CREATININE 1.81* 2.31* 1.83* 1.85*  CALCIUM 8.8* 8.5* 9.1 8.7*  MG 1.8  --   --   --     Liver Function Tests: No results for input(s): AST, ALT, ALKPHOS, BILITOT, PROT, ALBUMIN in the last 168 hours. No results for input(s): LIPASE,  AMYLASE in the last 168 hours. No results for input(s): AMMONIA in the last 168 hours.  CBC: Recent Labs  Lab 01/26/21 1917 01/27/21 0222  WBC 13.6* 14.2*  HGB 12.1* 11.1*  HCT 37.6* 34.8*  MCV 80.5 80.6  PLT 330 306    Cardiac Enzymes: No results for input(s): CKTOTAL, CKMB, CKMBINDEX, TROPONINI in the last 168 hours.  BNP: BNP (last 3 results) Recent Labs    01/26/21 1942  BNP 318.4*    ProBNP (last 3 results) No results for input(s): PROBNP in the last 8760 hours.    Other results:  Imaging: ECHOCARDIOGRAM COMPLETE  Result Date: 01/27/2021    ECHOCARDIOGRAM REPORT   Patient Name:   Randy [name] Date of Exam: 01/27/2021 Medical Rec #:  [id number]      Height:       80.0 in Accession #:    [id number]     Weight:       397.9 lb Date of Birth:  Mar 04, 1971      BSA:          3.080 m Patient Age:    50 years       BP:           151/114 mmHg Patient Gender: M              HR:           74 bpm. Exam Location:  Inpatient  Procedure: 2D Echo, 3D Echo, Color Doppler and Cardiac Doppler Indications:    I50.21 Acute systolic (congestive) heart failure  History:        Patient has prior history of Echocardiogram examinations, most                 recent 12/04/2019. CHF, Arrythmias:Atrial Fibrillation; Risk                 Factors:Diabetes and Hypertension. Prior performed at [facility].  Sonographer:    Irving Burton Senior RDCS Referring Phys: [id number] VASUNDHRA RATHORE IMPRESSIONS  1. Myocardium with speckled appearance; consider amyloid.  2. Left ventricular ejection fraction, by estimation, is 30 to 35%. The left ventricle has moderately decreased function. The left ventricle demonstrates global hypokinesis. The left ventricular internal cavity size was mildly dilated. There is severe left ventricular hypertrophy. Left ventricular diastolic function could not be evaluated.  3. Right ventricular systolic function is normal. The right ventricular size is normal. There is moderately elevated pulmonary  artery systolic pressure.  4. Left atrial size was severely dilated.  5. Right atrial size was mildly dilated.  6. The mitral valve is normal in structure. Mild mitral valve regurgitation. No evidence of mitral stenosis.  7. The aortic valve is tricuspid. Aortic valve regurgitation is not visualized. No aortic stenosis is present.  8. Aortic dilatation noted. There is borderline dilatation of the aortic root, measuring 38 mm. There is mild dilatation of the ascending aorta, measuring 43 mm.  9. The inferior vena cava is dilated in size with <50% respiratory variability, suggesting right atrial pressure of 15 mmHg. FINDINGS  Left Ventricle: Left ventricular ejection fraction, by estimation, is 30 to 35%. The left ventricle has moderately decreased function. The left ventricle demonstrates global hypokinesis. The left ventricular internal cavity size was mildly dilated. There is severe left ventricular hypertrophy. Left ventricular diastolic function could not be evaluated due to atrial fibrillation. Left ventricular diastolic function could not be evaluated. Right Ventricle: The right ventricular size is normal. Right ventricular systolic function is normal. There is moderately elevated pulmonary artery systolic pressure. The tricuspid regurgitant velocity is 2.87 m/s, and with an assumed right atrial pressure of 15 mmHg, the estimated right ventricular systolic pressure is 47.9 mmHg. Left Atrium: Left atrial size was severely dilated. Right Atrium: Right atrial size was mildly dilated. Pericardium: There is no evidence of pericardial effusion. Mitral Valve: The mitral valve is normal in structure. Mild mitral valve regurgitation. No evidence of mitral valve stenosis. Tricuspid Valve: The tricuspid valve is normal in structure. Tricuspid valve regurgitation is mild . No evidence of tricuspid stenosis. Aortic Valve: The aortic valve is tricuspid. Aortic valve regurgitation is not visualized. No aortic stenosis is  present. Pulmonic Valve: The pulmonic valve was normal in structure. Pulmonic valve regurgitation is mild. No evidence of pulmonic stenosis. Aorta: Aortic dilatation noted. There is borderline dilatation of the aortic root, measuring 38 mm. There is mild dilatation of the ascending aorta, measuring 43 mm. Venous: The inferior vena cava is dilated in size with less than 50% respiratory variability, suggesting right atrial pressure of 15 mmHg. IAS/Shunts: No atrial level shunt detected by color flow Doppler. Additional Comments: Myocardium with speckled appearance; consider amyloid.  LEFT VENTRICLE PLAX 2D LVIDd:         5.80 cm LVIDs:         5.00 cm LV PW:         1.60 cm LV IVS:  1.50 cm LVOT diam:     2.50 cm  3D Volume EF: LV SV:         78       3D EF:        36 % LV SV Index:   25       LV EDV:       322 ml LVOT Area:     4.91 cm LV ESV:       207 ml                         LV SV:        115 ml RIGHT VENTRICLE RV S prime:     6.90 cm/s TAPSE (M-mode): 1.6 cm LEFT ATRIUM              Index       RIGHT ATRIUM           Index LA diam:        5.40 cm  1.75 cm/m  RA Area:     29.30 cm LA Vol (A2C):   145.0 ml 47.07 ml/m RA Volume:   105.00 ml 34.09 ml/m LA Vol (A4C):   151.0 ml 49.02 ml/m LA Biplane Vol: 156.0 ml 50.64 ml/m  AORTIC VALVE LVOT Vmax:   85.10 cm/s LVOT Vmean:  64.933 cm/s LVOT VTI:    0.159 m  AORTA Ao Root diam: 3.80 cm Ao Asc diam:  4.30 cm TRICUSPID VALVE TR Peak grad:   32.9 mmHg TR Vmax:        287.00 cm/s  SHUNTS Systemic VTI:  0.16 m Systemic Diam: 2.50 cm Brian Crenshaw MD Electronically signed by Brian Crenshaw MD Signature Date/Time: 01/27/2021/1:34:19 PM    Final      Medications:     Scheduled Medications:  atorvastatin  20 mg Oral QHS   cloNIDine  0.1 mg Oral BID   famotidine  20 mg Oral BID   furosemide  40 mg Intravenous Q12H   insulin aspart  0-5 Units Subcutaneous QHS   insulin aspart  0-9 Units Subcutaneous TID WC   isosorbide-hydrALAZINE  0.5 tablet Oral TID    latanoprost  1 drop Both Eyes QHS   metoprolol succinate  50 mg Oral Daily   sodium chloride flush  3 mL Intravenous Q12H    Infusions:  sodium chloride     sodium chloride 10 mL/hr at 01/29/21 0654   amiodarone 30 mg/hr (01/29/21 0203)    PRN Medications: sodium chloride, sodium chloride flush   Assessment/Plan:   1. 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 ECHO this admit EF 30-35% with severe LVH. Doubt amyloid with uncontrolled hypertension.  - Has diuresed well.  - Continue to wean clonidine and add GDMT - Continue Toprol XL 50 mg daily - Increase Bidil to 1 tab tid - Add spiro, SGLT2i and Entresto post cath - Add ted hose.  - R/L cath today   2. Uncontrolled Hypertension   -Remains hypertensive. Plan as above - long discussion about absolute need for tight BP control - encouraged full compliance with CPAP   3. A Fib RVR - Rates improved with amio gtt.  - Eliquis held for cath.  - Will consider TEE/DC-CV prior to d/c   4. . CKD Stage IIIb  -Creatinine baseline 1.7-1.9 - Creatinine 1.7>2.3>1.8  -Followed by twice a year by Nephrology in [place].    5 DMII -Hgb A1C 6.3 - On SSI.  -   Off amaryl with reduced EF.  - Add SGLT2i prior to d/c   6. OSA -Using CPAP 3 times a week.  -Discussed and needs to use CPAP nightly.    7. Anemia, iron-def  -Iron Sats 6%  -Will need IV iron.    Length of Stay: 2   Arvilla Meres MD 01/29/2021, 8:51 AM  Advanced Heart Failure Team Pager [phone] (M-F; 7a - 4p)  Please contact [facility] for night-coverage after hours (4p -7a ) and weekends on [email or web address]

## 2021-01-29 NOTE — TOC Progression Note (Signed)
Transition of Care [facility]) - Progression Note    Patient Details  Name: Randy [name] MRN: [id number] Date of Birth: Dec 12, 1970  Transition of Care [facility]) CM/SW Contact  Elner Seifert, LCSWA Phone Number: 01/29/2021, 2:16 PM  Clinical Narrative:    HF CSW spoke with Mr. Swiss and his family at bedside and completed a very brief SDOH with him, he denied having any needs at this time. Mr. Davisson reported he does have a PCP and he can get to the pharmacy to pick up his medications, he uses the [facility] in [place] on [address]. CSW provided the patient with the social workers name and position and if anything changes to please reach out so that the CSW can provide support.  CSW will continue to follow throughout discharge.  Expected Discharge Plan: Home/Self Care    Expected Discharge Plan and Services Expected Discharge Plan: Home/Self Care In-house Referral: Clinical Social Work Discharge Planning Services: CM Consult   Living arrangements for the past 2 months: Single Family Home                                       Social Determinants of Health (SDOH) Interventions Food Insecurity Interventions: Intervention Not Indicated Financial Strain Interventions: Intervention Not Indicated Housing Interventions: Intervention Not Indicated Transportation Interventions: Intervention Not Indicated  Readmission Risk Interventions No flowsheet data found.  Serenna Deroy, MSW, LCSWA [phone] Heart Failure Social Worker

## 2021-01-29 NOTE — H&P (View-Only) (Signed)
Advanced Heart Failure Rounding Note   Subjective:    Diuresing well. Weight down 6 pounds overnight. Denies SOB, orthopnea or PND.   On IV amio for AF. Rates improved. Eliquis held for cath today   Objective:   Weight Range:  Vital Signs:   Temp:  [98.6 F (37 C)-99 F (37.2 C)] 98.6 F (37 C) (10/05 0434) Pulse Rate:  [70-90] 85 (10/05 0434) Resp:  [18-23] 23 (10/05 0434) BP: (140-160)/(106-114) 159/110 (10/05 0434) SpO2:  [97 %-98 %] 98 % (10/05 0434) Weight:  [176.4 kg] 176.4 kg (10/05 0434) Last BM Date: 01/27/21  Weight change: Filed Weights   01/26/21 1912 01/28/21 0432 01/29/21 0434  Weight: (!) 180.5 kg (!) 179.4 kg (!) 176.4 kg    Intake/Output:   Intake/Output Summary (Last 24 hours) at 01/29/2021 0851 Last data filed at 01/29/2021 0654 Gross per 24 hour  Intake 1500.21 ml  Output 2101 ml  Net -600.79 ml     Physical Exam: General:  Well appearing. No resp difficulty HEENT: normal Neck: supple. JVP 6-7. Carotids 2+ bilat; no bruits. No lymphadenopathy or thryomegaly appreciated. Cor: PMI nondisplaced. Irregular rate & rhythm. No rubs, gallops or murmurs. Lungs: clear Abdomen: soft, nontender, nondistended. No hepatosplenomegaly. No bruits or masses. Good bowel sounds. Extremities: no cyanosis, clubbing, rash, edema Neuro: alert & orientedx3, cranial nerves grossly intact. moves all 4 extremities w/o difficulty. Affect pleasant  Telemetry: AF 80-90s Personally reviewed   Labs: Basic Metabolic Panel: Recent Labs  Lab 01/26/21 1917 01/27/21 0222 01/28/21 0928 01/29/21 0354  NA 131* 131* 138 137  K 3.4* 3.6 3.6 3.3*  CL 99 100 102 103  CO2 21* 21* 26 25  GLUCOSE 172* 201* 178* 140*  BUN 17 21* 21* 24*  CREATININE 1.81* 2.31* 1.83* 1.85*  CALCIUM 8.8* 8.5* 9.1 8.7*  MG 1.8  --   --   --     Liver Function Tests: No results for input(s): AST, ALT, ALKPHOS, BILITOT, PROT, ALBUMIN in the last 168 hours. No results for input(s): LIPASE,  AMYLASE in the last 168 hours. No results for input(s): AMMONIA in the last 168 hours.  CBC: Recent Labs  Lab 01/26/21 1917 01/27/21 0222  WBC 13.6* 14.2*  HGB 12.1* 11.1*  HCT 37.6* 34.8*  MCV 80.5 80.6  PLT 330 306    Cardiac Enzymes: No results for input(s): CKTOTAL, CKMB, CKMBINDEX, TROPONINI in the last 168 hours.  BNP: BNP (last 3 results) Recent Labs    01/26/21 1942  BNP 318.4*    ProBNP (last 3 results) No results for input(s): PROBNP in the last 8760 hours.    Other results:  Imaging: ECHOCARDIOGRAM COMPLETE  Result Date: 01/27/2021    ECHOCARDIOGRAM REPORT   Patient Name:   Randy [name] Date of Exam: 01/27/2021 Medical Rec #:  [id number]      Height:       80.0 in Accession #:    [id number]     Weight:       397.9 lb Date of Birth:  Mar 04, 1971      BSA:          3.080 m Patient Age:    50 years       BP:           151/114 mmHg Patient Gender: M              HR:           74 bpm. Exam Location:  Inpatient  Procedure: 2D Echo, 3D Echo, Color Doppler and Cardiac Doppler Indications:    I50.21 Acute systolic (congestive) heart failure  History:        Patient has prior history of Echocardiogram examinations, most                 recent 12/04/2019. CHF, Arrythmias:Atrial Fibrillation; Risk                 Factors:Diabetes and Hypertension. Prior performed at [facility].  Sonographer:    Irving Burton Senior RDCS Referring Phys: [id number] VASUNDHRA RATHORE IMPRESSIONS  1. Myocardium with speckled appearance; consider amyloid.  2. Left ventricular ejection fraction, by estimation, is 30 to 35%. The left ventricle has moderately decreased function. The left ventricle demonstrates global hypokinesis. The left ventricular internal cavity size was mildly dilated. There is severe left ventricular hypertrophy. Left ventricular diastolic function could not be evaluated.  3. Right ventricular systolic function is normal. The right ventricular size is normal. There is moderately elevated pulmonary  artery systolic pressure.  4. Left atrial size was severely dilated.  5. Right atrial size was mildly dilated.  6. The mitral valve is normal in structure. Mild mitral valve regurgitation. No evidence of mitral stenosis.  7. The aortic valve is tricuspid. Aortic valve regurgitation is not visualized. No aortic stenosis is present.  8. Aortic dilatation noted. There is borderline dilatation of the aortic root, measuring 38 mm. There is mild dilatation of the ascending aorta, measuring 43 mm.  9. The inferior vena cava is dilated in size with <50% respiratory variability, suggesting right atrial pressure of 15 mmHg. FINDINGS  Left Ventricle: Left ventricular ejection fraction, by estimation, is 30 to 35%. The left ventricle has moderately decreased function. The left ventricle demonstrates global hypokinesis. The left ventricular internal cavity size was mildly dilated. There is severe left ventricular hypertrophy. Left ventricular diastolic function could not be evaluated due to atrial fibrillation. Left ventricular diastolic function could not be evaluated. Right Ventricle: The right ventricular size is normal. Right ventricular systolic function is normal. There is moderately elevated pulmonary artery systolic pressure. The tricuspid regurgitant velocity is 2.87 m/s, and with an assumed right atrial pressure of 15 mmHg, the estimated right ventricular systolic pressure is 47.9 mmHg. Left Atrium: Left atrial size was severely dilated. Right Atrium: Right atrial size was mildly dilated. Pericardium: There is no evidence of pericardial effusion. Mitral Valve: The mitral valve is normal in structure. Mild mitral valve regurgitation. No evidence of mitral valve stenosis. Tricuspid Valve: The tricuspid valve is normal in structure. Tricuspid valve regurgitation is mild . No evidence of tricuspid stenosis. Aortic Valve: The aortic valve is tricuspid. Aortic valve regurgitation is not visualized. No aortic stenosis is  present. Pulmonic Valve: The pulmonic valve was normal in structure. Pulmonic valve regurgitation is mild. No evidence of pulmonic stenosis. Aorta: Aortic dilatation noted. There is borderline dilatation of the aortic root, measuring 38 mm. There is mild dilatation of the ascending aorta, measuring 43 mm. Venous: The inferior vena cava is dilated in size with less than 50% respiratory variability, suggesting right atrial pressure of 15 mmHg. IAS/Shunts: No atrial level shunt detected by color flow Doppler. Additional Comments: Myocardium with speckled appearance; consider amyloid.  LEFT VENTRICLE PLAX 2D LVIDd:         5.80 cm LVIDs:         5.00 cm LV PW:         1.60 cm LV IVS:  1.50 cm LVOT diam:     2.50 cm  3D Volume EF: LV SV:         78       3D EF:        36 % LV SV Index:   25       LV EDV:       322 ml LVOT Area:     4.91 cm LV ESV:       207 ml                         LV SV:        115 ml RIGHT VENTRICLE RV S prime:     6.90 cm/s TAPSE (M-mode): 1.6 cm LEFT ATRIUM              Index       RIGHT ATRIUM           Index LA diam:        5.40 cm  1.75 cm/m  RA Area:     29.30 cm LA Vol (A2C):   145.0 ml 47.07 ml/m RA Volume:   105.00 ml 34.09 ml/m LA Vol (A4C):   151.0 ml 49.02 ml/m LA Biplane Vol: 156.0 ml 50.64 ml/m  AORTIC VALVE LVOT Vmax:   85.10 cm/s LVOT Vmean:  64.933 cm/s LVOT VTI:    0.159 m  AORTA Ao Root diam: 3.80 cm Ao Asc diam:  4.30 cm TRICUSPID VALVE TR Peak grad:   32.9 mmHg TR Vmax:        287.00 cm/s  SHUNTS Systemic VTI:  0.16 m Systemic Diam: 2.50 cm Olga Millers MD Electronically signed by Olga Millers MD Signature Date/Time: 01/27/2021/1:34:19 PM    Final      Medications:     Scheduled Medications:  atorvastatin  20 mg Oral QHS   cloNIDine  0.1 mg Oral BID   famotidine  20 mg Oral BID   furosemide  40 mg Intravenous Q12H   insulin aspart  0-5 Units Subcutaneous QHS   insulin aspart  0-9 Units Subcutaneous TID WC   isosorbide-hydrALAZINE  0.5 tablet Oral TID    latanoprost  1 drop Both Eyes QHS   metoprolol succinate  50 mg Oral Daily   sodium chloride flush  3 mL Intravenous Q12H    Infusions:  sodium chloride     sodium chloride 10 mL/hr at 01/29/21 0654   amiodarone 30 mg/hr (01/29/21 0203)    PRN Medications: sodium chloride, sodium chloride flush   Assessment/Plan:   1. 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 ECHO this admit EF 30-35% with severe LVH. Doubt amyloid with uncontrolled hypertension.  - Has diuresed well.  - Continue to wean clonidine and add GDMT - Continue Toprol XL 50 mg daily - Increase Bidil to 1 tab tid - Add spiro, SGLT2i and Entresto post cath - Add ted hose.  - R/L cath today   2. Uncontrolled Hypertension   -Remains hypertensive. Plan as above - long discussion about absolute need for tight BP control - encouraged full compliance with CPAP   3. A Fib RVR - Rates improved with amio gtt.  - Eliquis held for cath.  - Will consider TEE/DC-CV prior to d/c   4. . CKD Stage IIIb  -Creatinine baseline 1.7-1.9 - Creatinine 1.7>2.3>1.8  -Followed by twice a year by Nephrology in [place].    5 DMII -Hgb A1C 6.3 - On SSI.  -  Off amaryl with reduced EF.  - Add SGLT2i prior to d/c   6. OSA -Using CPAP 3 times a week.  -Discussed and needs to use CPAP nightly.    7. Anemia, iron-def  -Iron Sats 6%  -Will need IV iron.    Length of Stay: 2   Arvilla Meres MD 01/29/2021, 8:51 AM  Advanced Heart Failure Team Pager [phone] (M-F; 7a - 4p)  Please contact [facility] for night-coverage after hours (4p -7a ) and weekends on [email or web address]

## 2021-01-30 ENCOUNTER — Telehealth (HOSPITAL_COMMUNITY): Payer: Self-pay | Admitting: Pharmacy Technician

## 2021-01-30 ENCOUNTER — Other Ambulatory Visit (HOSPITAL_COMMUNITY): Payer: Self-pay

## 2021-01-30 ENCOUNTER — Encounter (HOSPITAL_COMMUNITY): Payer: Self-pay | Admitting: Family Medicine

## 2021-01-30 DIAGNOSIS — I5023 Acute on chronic systolic (congestive) heart failure: Secondary | ICD-10-CM | POA: Diagnosis not present

## 2021-01-30 LAB — BASIC METABOLIC PANEL
Anion gap: 12 (ref 5–15)
Anion gap: 9 (ref 5–15)
BUN: 17 mg/dL (ref 6–20)
BUN: 18 mg/dL (ref 6–20)
CO2: 24 mmol/L (ref 22–32)
CO2: 26 mmol/L (ref 22–32)
Calcium: 9.2 mg/dL (ref 8.9–10.3)
Calcium: 9.2 mg/dL (ref 8.9–10.3)
Chloride: 100 mmol/L (ref 98–111)
Chloride: 102 mmol/L (ref 98–111)
Creatinine, Ser: 1.8 mg/dL — ABNORMAL HIGH (ref 0.61–1.24)
Creatinine, Ser: 1.99 mg/dL — ABNORMAL HIGH (ref 0.61–1.24)
GFR, Estimated: 46 mL/min — ABNORMAL LOW (ref >60)
GFR, Estimated: 41 mL/min — ABNORMAL LOW (ref >60)
Glucose, Bld: 206 mg/dL — ABNORMAL HIGH (ref 70–99)
Glucose, Bld: 188 mg/dL — ABNORMAL HIGH (ref 70–99)
Potassium: 5.4 mmol/L — ABNORMAL HIGH (ref 3.5–5.1)
Potassium: 3.7 mmol/L (ref 3.5–5.1)
Sodium: 136 mmol/L (ref 135–145)
Sodium: 137 mmol/L (ref 135–145)

## 2021-01-30 LAB — GLUCOSE, CAPILLARY
Glucose-Capillary: 162 mg/dL — ABNORMAL HIGH (ref 70–99)
Glucose-Capillary: 190 mg/dL — ABNORMAL HIGH (ref 70–99)
Glucose-Capillary: 187 mg/dL — ABNORMAL HIGH (ref 70–99)
Glucose-Capillary: 184 mg/dL — ABNORMAL HIGH (ref 70–99)

## 2021-01-30 LAB — MAGNESIUM: Magnesium: 1.8 mg/dL (ref 1.7–2.4)

## 2021-01-30 MED ORDER — METOPROLOL SUCCINATE ER 100 MG PO TB24
100.0000 mg | ORAL_TABLET | Freq: Every day | ORAL | Status: DC
  Administered 2021-01-31: 100 mg via ORAL
  Filled 2021-01-30: qty 1

## 2021-01-30 MED ORDER — SODIUM CHLORIDE 0.9 % IV SOLN
510.0000 mg | Freq: Once | INTRAVENOUS | Status: AC
  Administered 2021-01-30: 510 mg via INTRAVENOUS
  Filled 2021-01-30: qty 17

## 2021-01-30 MED ORDER — TRAZODONE HCL 50 MG PO TABS
50.0000 mg | ORAL_TABLET | Freq: Every evening | ORAL | Status: DC | PRN
  Administered 2021-01-30: 50 mg via ORAL
  Filled 2021-01-30: qty 1

## 2021-01-30 MED ORDER — HYDRALAZINE HCL 20 MG/ML IJ SOLN
10.0000 mg | Freq: Once | INTRAMUSCULAR | Status: AC
Start: 2021-01-30 — End: 2021-01-30

## 2021-01-30 MED ORDER — HYDRALAZINE HCL 20 MG/ML IJ SOLN
INTRAMUSCULAR | Status: AC
  Administered 2021-01-30: 10 mg via INTRAVENOUS
  Filled 2021-01-30: qty 1

## 2021-01-30 MED ORDER — ACETAMINOPHEN 325 MG PO TABS
650.0000 mg | ORAL_TABLET | ORAL | Status: DC | PRN
  Administered 2021-01-31: 650 mg via ORAL
  Filled 2021-01-30: qty 2

## 2021-01-30 MED ORDER — SACUBITRIL-VALSARTAN 49-51 MG PO TABS: 1.0000 | ORAL_TABLET | Freq: Two times a day (BID) | ORAL | Status: DC

## 2021-01-30 MED ORDER — TRAMADOL HCL 50 MG PO TABS
50.0000 mg | ORAL_TABLET | Freq: Four times a day (QID) | ORAL | Status: DC | PRN
Start: 2021-01-30 — End: 2021-02-01
  Administered 2021-01-30 (×2): 50 mg via ORAL
  Filled 2021-01-30 (×2): qty 1

## 2021-01-30 MED ORDER — HYDRALAZINE HCL 20 MG/ML IJ SOLN
10.0000 mg | Freq: Once | INTRAMUSCULAR | Status: AC
  Administered 2021-01-30: 10 mg via INTRAVENOUS
  Filled 2021-01-30: qty 1

## 2021-01-30 MED ORDER — SACUBITRIL-VALSARTAN 49-51 MG PO TABS
1.0000 | ORAL_TABLET | Freq: Two times a day (BID) | ORAL | Status: DC
  Administered 2021-01-30: 1 via ORAL
  Filled 2021-01-30: qty 1

## 2021-01-30 MED ORDER — SODIUM ZIRCONIUM CYCLOSILICATE 10 G PO PACK
10.0000 g | PACK | Freq: Once | ORAL | Status: AC
  Administered 2021-01-30: 10 g via ORAL
  Filled 2021-01-30: qty 1

## 2021-01-30 MED ORDER — POTASSIUM CHLORIDE CRYS ER 20 MEQ PO TBCR: 40.0000 meq | EXTENDED_RELEASE_TABLET | Freq: Once | ORAL | Status: DC

## 2021-01-30 MED ORDER — SODIUM CHLORIDE 0.9 % IV SOLN: INTRAVENOUS | Status: DC

## 2021-01-30 MED ORDER — ACETAMINOPHEN 500 MG PO TABS
1000.0000 mg | ORAL_TABLET | Freq: Four times a day (QID) | ORAL | Status: AC
  Administered 2021-01-30 (×2): 1000 mg via ORAL
  Filled 2021-01-30 (×2): qty 2

## 2021-01-30 NOTE — Progress Notes (Signed)
On call MD informed of extreme HTN and severe headache. Verbal order received for 10mg  hydralazine IV. Tylenol given.

## 2021-01-30 NOTE — Anesthesia Preprocedure Evaluation (Addendum)
Anesthesia Evaluation  Patient identified by MRN, date of birth, ID band Patient awake    Reviewed: Allergy & Precautions, NPO status , Patient's Chart, lab work & pertinent test results, reviewed documented beta blocker date and time   History of Anesthesia Complications Negative for: history of anesthetic complications  Airway Mallampati: II  TM Distance: >3 FB Neck ROM: Full    Dental  (+) Chipped, Dental Advisory Given, Teeth Intact,    Pulmonary neg pulmonary ROS,    Pulmonary exam normal        Cardiovascular hypertension, Pt. on medications and Pt. on home beta blockers +CHF  Normal cardiovascular examDysrhythmias: on Eliquis. Atrial Fibrillation   TTE 01/27/21: myocardium with speckled appearance; consider amyloid, EF 30-35%, global hypokinesis, severe LVH, moderately elevated PASP, severe LAE, mild RAE, mild MR, borderline dilatation of aortic root measuring 19m, mild dilatation of ascending aorta measuring 462m   Neuro/Psych CVA negative psych ROS   GI/Hepatic negative GI ROS, Neg liver ROS,   Endo/Other  diabetes, Type 2, Oral Hypoglycemic AgentsMorbid obesity  Renal/GU Renal InsufficiencyRenal disease (Cr 1.99)  negative genitourinary   Musculoskeletal negative musculoskeletal ROS (+)   Abdominal   Peds  Hematology  (+) anemia , Hgb 11.9   Anesthesia Other Findings Day of surgery medications reviewed with patient.  Reproductive/Obstetrics negative OB ROS                           Anesthesia Physical Anesthesia Plan  ASA: 4  Anesthesia Plan: MAC   Post-op Pain Management:    Induction:   PONV Risk Score and Plan: 1 and Treatment may vary due to age or medical condition and Propofol infusion  Airway Management Planned: Natural Airway and Nasal Cannula  Additional Equipment: None  Intra-op Plan:   Post-operative Plan:   Informed Consent: I have reviewed the patients  History and Physical, chart, labs and discussed the procedure including the risks, benefits and alternatives for the proposed anesthesia with the patient or authorized representative who has indicated his/her understanding and acceptance.       Plan Discussed with: CRNA  Anesthesia Plan Comments:       Anesthesia Quick Evaluation

## 2021-01-30 NOTE — Plan of Care (Signed)
?  Problem: Education: ?Goal: Ability to demonstrate management of disease process will improve ?Outcome: Progressing ?  ?Problem: Activity: ?Goal: Capacity to carry out activities will improve ?Outcome: Progressing ?  ?Problem: Cardiac: ?Goal: Ability to achieve and maintain adequate cardiopulmonary perfusion will improve ?Outcome: Progressing ?  ?

## 2021-01-30 NOTE — Progress Notes (Addendum)
Advanced Heart Failure Rounding Note   Subjective:    R/LHC 10/5 showed mild nonobstructive CAD, elevated filling pressures and normal CO. PCW was 28. IV Lasix resumed.   Good diuresis yesterday w/ IV Lasix, -3.6L out. Wt down 11 lb.   BMP in process.   BP elevated 170s/110s but weaning down clonidine. Has mild HA  Remains in Afib w/ RVR 130s.   Overall feels better. Denies resting and exertional dyspnea. Radial cath site ok.   R/LHC    Prox RCA lesion is 20% stenosed.   Mid Cx to Dist Cx lesion is 30% stenosed.   Prox LAD to Mid LAD lesion is 30% stenosed.   Findings:   Ao = 163/108 (134) LV = 136/25 RA =  15 RV = 51/13 PA = 57/19 (39) PCW = 28 Fick cardiac output/index = 9.2/3.0 PVR = 1.2 WU Ao sat = 94% PA sat = 65%, 65%   Assessment:   1. Mild non-obstructive CAD 2. NICM (likely hypertensive) with EF 35% by echo 3. Elevated filling pressures with normal CO 4. Severe systemic HTN  Objective:   Weight Range:  Vital Signs:   Temp:  [97.7 F (36.5 C)-98.3 F (36.8 C)] 98 F (36.7 C) (10/06 0812) Pulse Rate:  [0-111] 62 (10/06 0812) Resp:  [0-43] 18 (10/06 0812) BP: (142-186)/(98-130) 176/116 (10/06 0812) SpO2:  [0 %-100 %] 100 % (10/06 0812) Weight:  [171.5 kg] 171.5 kg (10/06 0617) Last BM Date: 01/26/21  Weight change: Filed Weights   01/28/21 0432 01/29/21 0434 01/30/21 0617  Weight: (!) 179.4 kg (!) 176.4 kg (!) 171.5 kg    Intake/Output:   Intake/Output Summary (Last 24 hours) at 01/30/2021 0959 Last data filed at 01/30/2021 4580 Gross per 24 hour  Intake 683 ml  Output 3600 ml  Net -2917 ml     PHYSICAL EXAM: General:  Well appearing, obese, sitting up on side of bed No respiratory difficulty HEENT: normal Neck: supple. JVD 8 cm. Carotids 2+ bilat; no bruits. No lymphadenopathy or thyromegaly appreciated. Cor: PMI nondisplaced. Irregularly irregular rhythm, tachy rate. No rubs, gallops or murmurs. Lungs: clear Abdomen: soft,  nontender, nondistended. No hepatosplenomegaly. No bruits or masses. Good bowel sounds. Extremities: no cyanosis, clubbing, rash, trace bilateral ankle edema Neuro: alert & oriented x 3, cranial nerves grossly intact. moves all 4 extremities w/o difficulty. Affect pleasant.   Telemetry: AF w/ RVR 130s Personally reviewed   Labs: Basic Metabolic Panel: Recent Labs  Lab 01/26/21 1917 01/27/21 0222 01/28/21 0928 01/29/21 0354 01/29/21 1114 01/29/21 1118  NA 131* 131* 138 137 145 143  145  K 3.4* 3.6 3.6 3.3* 3.1* 3.3*  3.0*  CL 99 100 102 103  --   --   CO2 21* 21* 26 25  --   --   GLUCOSE 172* 201* 178* 140*  --   --   BUN 17 21* 21* 24*  --   --   CREATININE 1.81* 2.31* 1.83* 1.85*  --   --   CALCIUM 8.8* 8.5* 9.1 8.7*  --   --   MG 1.8  --   --   --   --   --     Liver Function Tests: No results for input(s): AST, ALT, ALKPHOS, BILITOT, PROT, ALBUMIN in the last 168 hours. No results for input(s): LIPASE, AMYLASE in the last 168 hours. No results for input(s): AMMONIA in the last 168 hours.  CBC: Recent Labs  Lab 01/26/21 1917 01/27/21  0222 01/29/21 1114 01/29/21 1118  WBC 13.6* 14.2*  --   --   HGB 12.1* 11.1* 11.2* 11.9*  11.6*  HCT 37.6* 34.8* 33.0* 35.0*  34.0*  MCV 80.5 80.6  --   --   PLT 330 306  --   --     Cardiac Enzymes: No results for input(s): CKTOTAL, CKMB, CKMBINDEX, TROPONINI in the last 168 hours.  BNP: BNP (last 3 results) Recent Labs    01/26/21 1942  BNP 318.4*    ProBNP (last 3 results) No results for input(s): PROBNP in the last 8760 hours.    Other results:  Imaging: CARDIAC CATHETERIZATION  Result Date: 01/29/2021   Prox RCA lesion is 20% stenosed.   Mid Cx to Dist Cx lesion is 30% stenosed.   Prox LAD to Mid LAD lesion is 30% stenosed. Findings: Ao = 163/108 (134) LV = 136/25 RA =  15 RV = 51/13 PA = 57/19 (39) PCW = 28 Fick cardiac output/index = 9.2/3.0 PVR = 1.2 WU Ao sat = 94% PA sat = 65%, 65% Assessment: 1. Mild  non-obstructive CAD 2. NICM (likely hypertensive) with EF 35% by echo 3. Elevated filling pressures with normal CO 4. Severe systemic HTN Plan/Discussion: Resume diuresis.  Continue titration of GDMT. Arvilla Meres, MD 11:45 AM    Medications:     Scheduled Medications:  apixaban  5 mg Oral BID   atorvastatin  20 mg Oral QHS   cloNIDine  0.1 mg Oral BID   empagliflozin  10 mg Oral Daily   famotidine  20 mg Oral BID   furosemide  80 mg Intravenous BID   insulin aspart  0-5 Units Subcutaneous QHS   insulin aspart  0-9 Units Subcutaneous TID WC   irbesartan  150 mg Oral Daily   isosorbide-hydrALAZINE  1 tablet Oral TID   latanoprost  1 drop Both Eyes QHS   metoprolol succinate  50 mg Oral Daily   potassium chloride  40 mEq Oral Once   sodium chloride flush  3 mL Intravenous Q12H   sodium chloride flush  3 mL Intravenous Q12H   spironolactone  12.5 mg Oral Daily    Infusions:  sodium chloride     amiodarone 30 mg/hr (01/30/21 0823)    PRN Medications: sodium chloride, acetaminophen, ondansetron (ZOFRAN) IV, sodium chloride flush   Assessment/Plan:   1. A/C HFrEF - NICM. 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 ECHO this admit EF 30-35% with severe LVH. Doubt amyloid with uncontrolled hypertension.  - Has diuresed well. Still w/ mild fluid overload. Given 80 IV Lasix this am, likely transition to PO soon - Continue to wean clonidine and add GDMT - Increase Toprol XL 100 mg daily - Continue Bidil1 tab tid - Continue Spiro 12.5, increase to 25 if K stable  - On Irbesartan 150. Stop and Add Entresto 49-51 mg bid   -Continue Jardiance 10  - Add TED hose   2. Uncontrolled Hypertension   -Remains hypertensive. Plan as above - long discussion about absolute need for tight BP control - encouraged full compliance with CPAP - Consider w/u for secondary causes, defer to IM    3. A Fib RVR - continue amio gtt.  - Increase Toprol XL to 100 mg daily  -  Continue Eliquis 5 mg bid  - Will need TEE/DC-CV prior to d/c. Arrange for tomorrow  - Keep K > 4.0 and Mg > 2.0    4. CKD Stage IIIb  -  Creatinine baseline 1.7-1.9 - Creatinine 1.7>2.3>1.8>pending   - Followed by twice a year by Nephrology in [place].    5 DMII -Hgb A1C 6.3 - On SSI.  - Off amaryl with reduced EF.  - on Jardiance 10    6. OSA -Using CPAP 3 times a week.  -Discussed and needs to use CPAP nightly.    7. Anemia, iron-def  -Iron Sats 6%  -Will need IV iron.   8. CAD - mild nonobstructive on cath - needs aggressive risk factor modification w/ better BP control and lipid reduction  - c/w statin, LDL Goal < 70  - on ? blocker - no ASA w/ Eliquis    Length of Stay: 3   Brittainy Simmons PA-C  01/30/2021, 9:59 AM  Advanced Heart Failure Team Pager [phone] (M-F; 7a - 4p)  Please contact [facility] for night-coverage after hours (4p -7a ) and weekends on [email or web address]  Patient seen and examined with the above-signed Advanced Practice Provider and/or Housestaff. I personally reviewed laboratory data, imaging studies and relevant notes. I independently examined the patient and formulated the important aspects of the plan. I have edited the note to reflect any of my changes or salient points. I have personally discussed the plan with the patient and/or family.  Diuresing well. BP remains high. Cath results reviewed with him and his family. BMET pending. Denies orthopnea or PND.   General:  Well appearing. No resp difficulty HEENT: normal Neck: supple. JVP up Carotids 2+ bilat; no bruits. No lymphadenopathy or thryomegaly appreciated. Cor: PMI nondisplaced. Irregular rate & rhythm. No rubs, gallops or murmurs. Lungs: clear Abdomen: soft, nontender, nondistended. No hepatosplenomegaly. No bruits or masses. Good bowel sounds. Extremities: no cyanosis, clubbing, rash, edema Neuro: alert & orientedx3, cranial nerves grossly intact. moves all 4 extremities w/o  difficulty. Affect pleasant  Continue IV diuresis one more day. Continue to adjust HTN/HF meds. Await BMET. Plan TEE/DC-CV tomorrow.   Arvilla Meres, MD  10:37 AM

## 2021-01-30 NOTE — Progress Notes (Signed)
PROGRESS NOTE    Randy [name]  VEL:[id number] DOB: [birth year] DOA: 01/26/2021 PCP: [facility]    Chief Complaint  Patient presents with   Shortness of Breath    Brief Narrative:  50 year old male with history of chronic systolic CHF, permanent atrial fibrillation on Eliquis, hypertension, hypertensive retinopathy, CKD stage III, hyperlipidemia, diabetes mellitus type 2, history of Bell's palsy, OSA, prior strokes came to ED with worsening shortness of breath.  BNP was 318.  Chest x-ray showed pulmonary edema.   Assessment & Plan:   Principal Problem:   New onset of congestive heart failure (HCC) Active Problems:   Permanent atrial fibrillation (HCC)   SIRS (systemic inflammatory response syndrome) (HCC)   HTN (hypertension)   Hypokalemia   Acute systolic CHF (congestive heart failure) (HCC)   Acute on chronic systolic CHF:  Diuresing with IV lasix 40 mg every 12 hours.  .  Cardiology on board and appreciate input.  Echocardiogram showing reduced LVef of 30 to 35%,global hypokinesis.  Currently on bidil 1 tab TID.  and lasix IV 80 MG  BID and metoprolol 100 mg daily, entresto 1 tab BID.  Diuresed about 3.6 lit in the last 24 hours.  Underwent LHC and RHC on 01/29/21,. Mild obstructive CAD.    Hypertension:  Not well controlled.  On clonidine, metoprolol and bidil.  Increased the metoprolol to 100 mg daily.  Entresto was added.    Hyperlipidemia:  Resume statin.    Hypokalemia:  Replaced.   Hyperkalemia:  Over supplementation.  Repeat BMP this afternoon.    Permanent atrial fibrillation  Rate controlled.  On amiodarone and metoprolol for rate control and on eliquis for anti coagulation.  TEE /DCCV in am  Leukocytosis:  Unclear etiology.  Probably reactive.  CXR showed pulm edema.  No urinary symptoms.     Diabetes Mellitus:  Non insulin dependent,  On metformin and glimepiride at home which are on hold for now.  CBG (last 3)  Recent  Labs    01/29/21 2120 01/30/21 0615 01/30/21 1136  GLUCAP 197* 162* 190*    Last A1c is 6.3%.   Iron deficiency anemia:  -recheck hemoglobin in am.    GERD Stable.    Stage 3 a CKD: Creatinine at baseline around 1.8.    Elevated troponin from demand ischemia in the setting of decompensated CHF and atrial fib.      DVT prophylaxis: (Eliquis) Code Status: (Full code) Family Communication: none at bedside.  Disposition:   Status is: Inpatient  Remains inpatient appropriate because:Ongoing diagnostic testing needed not appropriate for outpatient work up and IV treatments appropriate due to intensity of illness or inability to take PO  Dispo: The patient is from: Home              Anticipated d/c is to: Home              Patient currently is not medically stable to d/c.   Difficult to place patient No       Consultants:   Cardiology.   Procedures: Right and left heart cath.   Antimicrobials: none   Subjective: No new complaints.   Objective: Vitals:   01/29/21 1956 01/29/21 2326 01/30/21 0617 01/30/21 0812  BP: (!) 173/122 (!) 144/111 (!) 178/129 (!) 176/116  Pulse: 98 87 (!) 110 62  Resp: 18  18 18   Temp: 97.7 F (36.5 C)  98.3 F (36.8 C) 98 F (36.7 C)  TempSrc: Oral  Oral Oral  SpO2:  100%  99% 100%  Weight:   (!) 171.5 kg   Height:        Intake/Output Summary (Last 24 hours) at 01/30/2021 1441 Last data filed at 01/30/2021 0823 Gross per 24 hour  Intake 803 ml  Output 3000 ml  Net -2197 ml    Filed Weights   01/28/21 0432 01/29/21 0434 01/30/21 0617  Weight: (!) 179.4 kg (!) 176.4 kg (!) 171.5 kg    Examination:  General exam: Appears calm and comfortable  Respiratory system: diminished at bases, on RA.  Cardiovascular system: S1 & S2 heard, RRR. JVD present, improving pedal edema.  Gastrointestinal system: Abdomen is nondistended, soft and nontender.  Normal bowel sounds heard. Central nervous system: Alert and oriented. No  focal neurological deficits. Extremities: Symmetric 5 x 5 power. Skin: No rashes, lesions or ulcers Psychiatry: Mood & affect appropriate.     Data Reviewed: I have personally reviewed following labs and imaging studies  CBC: Recent Labs  Lab 01/26/21 1917 01/27/21 0222 01/29/21 1114 01/29/21 1118  WBC 13.6* 14.2*  --   --   HGB 12.1* 11.1* 11.2* 11.9*  11.6*  HCT 37.6* 34.8* 33.0* 35.0*  34.0*  MCV 80.5 80.6  --   --   PLT 330 306  --   --      Basic Metabolic Panel: Recent Labs  Lab 01/26/21 1917 01/27/21 0222 01/28/21 0928 01/29/21 0354 01/29/21 1114 01/29/21 1118 01/30/21 0939  NA 131* 131* 138 137 145 143  145 136  K 3.4* 3.6 3.6 3.3* 3.1* 3.3*  3.0* 5.4*  CL 99 100 102 103  --   --  100  CO2 21* 21* 26 25  --   --  24  GLUCOSE 172* 201* 178* 140*  --   --  206*  BUN 17 21* 21* 24*  --   --  17  CREATININE 1.81* 2.31* 1.83* 1.85*  --   --  1.80*  CALCIUM 8.8* 8.5* 9.1 8.7*  --   --  9.2  MG 1.8  --   --   --   --   --  1.8     GFR: Estimated Creatinine Clearance: 89.6 mL/min (A) (by C-G formula based on SCr of 1.8 mg/dL (H)).  Liver Function Tests: No results for input(s): AST, ALT, ALKPHOS, BILITOT, PROT, ALBUMIN in the last 168 hours.  CBG: Recent Labs  Lab 01/29/21 1200 01/29/21 1618 01/29/21 2120 01/30/21 0615 01/30/21 1136  GLUCAP 156* 190* 197* 162* 190*      Recent Results (from the past 240 hour(s))  Resp Panel by RT-PCR (Flu A&B, Covid) Nasopharyngeal Swab     Status: None   Collection Time: 01/26/21  7:24 PM   Specimen: Nasopharyngeal Swab; Nasopharyngeal(NP) swabs in vial transport medium  Result Value Ref Range Status   SARS Coronavirus 2 by RT PCR NEGATIVE NEGATIVE Final    Comment: (NOTE) SARS-CoV-2 target nucleic acids are NOT DETECTED.  The SARS-CoV-2 RNA is generally detectable in upper respiratory specimens during the acute phase of infection. The lowest concentration of SARS-CoV-2 viral copies this assay can  detect is 138 copies/mL. A negative result does not preclude SARS-Cov-2 infection and should not be used as the sole basis for treatment or other patient management decisions. A negative result may occur with  improper specimen collection/handling, submission of specimen other than nasopharyngeal swab, presence of viral mutation(s) within the areas targeted by this assay, and inadequate number of viral copies(<138 copies/mL). A negative  result must be combined with clinical observations, patient history, and epidemiological information. The expected result is Negative.  Fact Sheet for Patients:  BloggerCourse.com  Fact Sheet for Healthcare Providers:  SeriousBroker.it  This test is no t yet approved or cleared by the [place] FDA and  has been authorized for detection and/or diagnosis of SARS-CoV-2 by FDA under an Emergency Use Authorization (EUA). This EUA will remain  in effect (meaning this test can be used) for the duration of the COVID-19 declaration under Section 564(b)(1) of the Act, 21 U.S.C.section 360bbb-3(b)(1), unless the authorization is terminated  or revoked sooner.       Influenza A by PCR NEGATIVE NEGATIVE Final   Influenza B by PCR NEGATIVE NEGATIVE Final    Comment: (NOTE) The Xpert Xpress SARS-CoV-2/FLU/RSV plus assay is intended as an aid in the diagnosis of influenza from Nasopharyngeal swab specimens and should not be used as a sole basis for treatment. Nasal washings and aspirates are unacceptable for Xpert Xpress SARS-CoV-2/FLU/RSV testing.  Fact Sheet for Patients: BloggerCourse.com  Fact Sheet for Healthcare Providers: SeriousBroker.it  This test is not yet approved or cleared by the [place] FDA and has been authorized for detection and/or diagnosis of SARS-CoV-2 by FDA under an Emergency Use Authorization (EUA). This EUA will remain in  effect (meaning this test can be used) for the duration of the COVID-19 declaration under Section 564(b)(1) of the Act, 21 U.S.C. section 360bbb-3(b)(1), unless the authorization is terminated or revoked.  Performed at [facility], [address], [place], [place] [address]           Radiology Studies: CARDIAC CATHETERIZATION  Result Date: 01/29/2021   Prox RCA lesion is 20% stenosed.   Mid Cx to Dist Cx lesion is 30% stenosed.   Prox LAD to Mid LAD lesion is 30% stenosed. Findings: Ao = 163/108 (134) LV = 136/25 RA =  15 RV = 51/13 PA = 57/19 (39) PCW = 28 Fick cardiac output/index = 9.2/3.0 PVR = 1.2 WU Ao sat = 94% PA sat = 65%, 65% Assessment: 1. Mild non-obstructive CAD 2. NICM (likely hypertensive) with EF 35% by echo 3. Elevated filling pressures with normal CO 4. Severe systemic HTN Plan/Discussion: Resume diuresis.  Continue titration of GDMT. Arvilla Meres, MD 11:45 AM       Scheduled Meds:  acetaminophen  1,000 mg Oral Q6H   apixaban  5 mg Oral BID   atorvastatin  20 mg Oral QHS   cloNIDine  0.1 mg Oral BID   empagliflozin  10 mg Oral Daily   famotidine  20 mg Oral BID   furosemide  80 mg Intravenous BID   insulin aspart  0-5 Units Subcutaneous QHS   insulin aspart  0-9 Units Subcutaneous TID WC   isosorbide-hydrALAZINE  1 tablet Oral TID   latanoprost  1 drop Both Eyes QHS   [START ON 01/31/2021] metoprolol succinate  100 mg Oral Daily   sacubitril-valsartan  1 tablet Oral BID   sodium chloride flush  3 mL Intravenous Q12H   sodium chloride flush  3 mL Intravenous Q12H   Continuous Infusions:  sodium chloride     amiodarone 30 mg/hr (01/30/21 1000)     LOS: 3 days        Kathlen Mody, MD [facility]   To contact the attending provider between 7A-7P or the covering provider during after hours 7P-7A, please log into the web site [email or web address] and access using universal [place] password  for that web site. If you do not have the  password, please call the hospital operator.  01/30/2021, 2:41 PM

## 2021-01-30 NOTE — Telephone Encounter (Signed)
Patient Advocate Encounter   Received notification from [employer] that prior authorization for Entresto 24-26mg  is required.   PA submitted on CoverMyMeds Key [id number] Status is pending   Will continue to follow.

## 2021-01-31 ENCOUNTER — Other Ambulatory Visit (HOSPITAL_COMMUNITY): Payer: Self-pay

## 2021-01-31 ENCOUNTER — Encounter (HOSPITAL_COMMUNITY)
Admission: EM | Disposition: A | Discharge status: Home / Self Care | Payer: Self-pay | Source: Home / Self Care | Attending: Internal Medicine

## 2021-01-31 ENCOUNTER — Inpatient Hospital Stay (HOSPITAL_COMMUNITY): Payer: 59

## 2021-01-31 ENCOUNTER — Inpatient Hospital Stay (HOSPITAL_COMMUNITY): Payer: 59 | Admitting: Anesthesiology

## 2021-01-31 ENCOUNTER — Encounter (HOSPITAL_COMMUNITY): Payer: Self-pay | Admitting: Family Medicine

## 2021-01-31 DIAGNOSIS — I361 Nonrheumatic tricuspid (valve) insufficiency: Secondary | ICD-10-CM

## 2021-01-31 DIAGNOSIS — I4891 Unspecified atrial fibrillation: Secondary | ICD-10-CM | POA: Diagnosis not present

## 2021-01-31 DIAGNOSIS — I5023 Acute on chronic systolic (congestive) heart failure: Secondary | ICD-10-CM | POA: Diagnosis not present

## 2021-01-31 HISTORY — PX: CARDIOVERSION: SHX1299

## 2021-01-31 HISTORY — PX: TEE WITHOUT CARDIOVERSION: SHX5443

## 2021-01-31 LAB — GLUCOSE, CAPILLARY
Glucose-Capillary: 148 mg/dL — ABNORMAL HIGH (ref 70–99)
Glucose-Capillary: 173 mg/dL — ABNORMAL HIGH (ref 70–99)
Glucose-Capillary: 234 mg/dL — ABNORMAL HIGH (ref 70–99)
Glucose-Capillary: 218 mg/dL — ABNORMAL HIGH (ref 70–99)

## 2021-01-31 LAB — BASIC METABOLIC PANEL
Anion gap: 12 (ref 5–15)
BUN: 18 mg/dL (ref 6–20)
CO2: 24 mmol/L (ref 22–32)
Calcium: 9.3 mg/dL (ref 8.9–10.3)
Chloride: 101 mmol/L (ref 98–111)
Creatinine, Ser: 1.99 mg/dL — ABNORMAL HIGH (ref 0.61–1.24)
GFR, Estimated: 41 mL/min — ABNORMAL LOW (ref >60)
Glucose, Bld: 155 mg/dL — ABNORMAL HIGH (ref 70–99)
Potassium: 3.7 mmol/L (ref 3.5–5.1)
Sodium: 137 mmol/L (ref 135–145)

## 2021-01-31 LAB — PROTIME-INR
INR: 1.2 (ref 0.8–1.2)
Prothrombin Time: 15.3 seconds — ABNORMAL HIGH (ref 11.4–15.2)

## 2021-01-31 SURGERY — ECHOCARDIOGRAM, TRANSESOPHAGEAL
Anesthesia: Monitor Anesthesia Care

## 2021-01-31 MED ORDER — ESMOLOL HCL 100 MG/10ML IV SOLN
INTRAVENOUS | Status: DC | PRN
  Administered 2021-01-31: 40 ug via INTRAVENOUS

## 2021-01-31 MED ORDER — ISOSORB DINITRATE-HYDRALAZINE 20-37.5 MG PO TABS
1.0000 | ORAL_TABLET | Freq: Three times a day (TID) | ORAL | 2 refills | Status: DC
  Filled 2021-01-31: qty 90, 30d supply, fill #0

## 2021-01-31 MED ORDER — AMIODARONE HCL 200 MG PO TABS: 200.0000 mg | ORAL_TABLET | Freq: Every day | ORAL | 1 refills | Status: DC

## 2021-01-31 MED ORDER — SACUBITRIL-VALSARTAN 97-103 MG PO TABS
1.0000 | ORAL_TABLET | Freq: Two times a day (BID) | ORAL | Status: DC
  Administered 2021-01-31: 1 via ORAL
  Filled 2021-01-31 (×2): qty 1

## 2021-01-31 MED ORDER — CLONIDINE HCL 0.1 MG PO TABS
0.1000 mg | ORAL_TABLET | Freq: Two times a day (BID) | ORAL | 11 refills | Status: DC
  Filled 2021-01-31: qty 60, 30d supply, fill #0

## 2021-01-31 MED ORDER — PROPOFOL 500 MG/50ML IV EMUL
INTRAVENOUS | Status: DC | PRN
  Administered 2021-01-31: 50 ug/kg/min via INTRAVENOUS

## 2021-01-31 MED ORDER — CLONIDINE HCL 0.1 MG PO TABS: 0.1000 mg | ORAL_TABLET | Freq: Two times a day (BID) | ORAL | 11 refills | Status: DC

## 2021-01-31 MED ORDER — FUROSEMIDE 80 MG PO TABS: 80.0000 mg | ORAL_TABLET | Freq: Every day | ORAL | 2 refills | Status: DC

## 2021-01-31 MED ORDER — SPIRONOLACTONE 25 MG PO TABS
25.0000 mg | ORAL_TABLET | Freq: Every day | ORAL | Status: DC
  Administered 2021-01-31: 25 mg via ORAL
  Filled 2021-01-31: qty 1

## 2021-01-31 MED ORDER — SACUBITRIL-VALSARTAN 97-103 MG PO TABS: 1.0000 | ORAL_TABLET | Freq: Two times a day (BID) | ORAL | 2 refills | Status: DC

## 2021-01-31 MED ORDER — SACUBITRIL-VALSARTAN 97-103 MG PO TABS
1.0000 | ORAL_TABLET | Freq: Two times a day (BID) | ORAL | 2 refills | Status: DC
  Filled 2021-01-31: qty 60, 30d supply, fill #0

## 2021-01-31 MED ORDER — EMPAGLIFLOZIN 10 MG PO TABS: 10.0000 mg | ORAL_TABLET | Freq: Every day | ORAL | 3 refills | Status: DC

## 2021-01-31 MED ORDER — FUROSEMIDE 80 MG PO TABS: 80.0000 mg | ORAL_TABLET | Freq: Every day | ORAL | Status: DC

## 2021-01-31 MED ORDER — PROPOFOL 10 MG/ML IV BOLUS
INTRAVENOUS | Status: DC | PRN
  Administered 2021-01-31 (×2): 20 mg via INTRAVENOUS

## 2021-01-31 MED ORDER — BUTAMBEN-TETRACAINE-BENZOCAINE 2-2-14 % EX AERO
INHALATION_SPRAY | CUTANEOUS | Status: DC | PRN
  Administered 2021-01-31: 2 via TOPICAL

## 2021-01-31 MED ORDER — FUROSEMIDE 80 MG PO TABS
80.0000 mg | ORAL_TABLET | Freq: Every day | ORAL | 2 refills | Status: DC
  Filled 2021-01-31: qty 30, 30d supply, fill #0

## 2021-01-31 MED ORDER — APIXABAN 5 MG PO TABS: 5.0000 mg | ORAL_TABLET | Freq: Two times a day (BID) | ORAL | 11 refills | Status: DC

## 2021-01-31 MED ORDER — POTASSIUM CHLORIDE CRYS ER 20 MEQ PO TBCR: 40.0000 meq | EXTENDED_RELEASE_TABLET | ORAL | Status: DC

## 2021-01-31 MED ORDER — EMPAGLIFLOZIN 10 MG PO TABS
10.0000 mg | ORAL_TABLET | Freq: Every day | ORAL | 3 refills | Status: DC
  Filled 2021-01-31: qty 30, 30d supply, fill #0

## 2021-01-31 MED ORDER — METOPROLOL SUCCINATE ER 100 MG PO TB24: 100.0000 mg | ORAL_TABLET | Freq: Every day | ORAL | 2 refills | Status: DC

## 2021-01-31 MED ORDER — METOPROLOL SUCCINATE ER 100 MG PO TB24
100.0000 mg | ORAL_TABLET | Freq: Every day | ORAL | 2 refills | Status: DC
  Filled 2021-01-31: qty 30, 30d supply, fill #0

## 2021-01-31 MED ORDER — AMIODARONE HCL 200 MG PO TABS
200.0000 mg | ORAL_TABLET | Freq: Every day | ORAL | 1 refills | Status: DC
  Filled 2021-01-31: qty 30, 30d supply, fill #0

## 2021-01-31 MED ORDER — DEXAMETHASONE SODIUM PHOSPHATE 10 MG/ML IJ SOLN: INTRAMUSCULAR | Status: DC | PRN

## 2021-01-31 MED ORDER — ETOMIDATE 2 MG/ML IV SOLN
INTRAVENOUS | Status: DC | PRN
  Administered 2021-01-31 (×2): 4 mg via INTRAVENOUS

## 2021-01-31 MED ORDER — SPIRONOLACTONE 25 MG PO TABS
25.0000 mg | ORAL_TABLET | Freq: Every day | ORAL | 2 refills | Status: DC
  Filled 2021-01-31: qty 30, 30d supply, fill #0

## 2021-01-31 MED ORDER — AMIODARONE HCL 200 MG PO TABS
200.0000 mg | ORAL_TABLET | Freq: Every day | ORAL | Status: DC
  Administered 2021-01-31: 200 mg via ORAL
  Filled 2021-01-31: qty 1

## 2021-01-31 MED ORDER — SPIRONOLACTONE 25 MG PO TABS: 25.0000 mg | ORAL_TABLET | Freq: Every day | ORAL | 2 refills | Status: DC

## 2021-01-31 MED ORDER — ISOSORB DINITRATE-HYDRALAZINE 20-37.5 MG PO TABS: 1.0000 | ORAL_TABLET | Freq: Three times a day (TID) | ORAL | 2 refills | Status: DC

## 2021-01-31 NOTE — Progress Notes (Signed)
Verbal order from Bensimhon MD, to discontinue amiodarone gtt and to give the patient a one time dose 200mg  PO amiodarone tablet prior to d/c.

## 2021-01-31 NOTE — Plan of Care (Signed)
  Problem: Education: Goal: Ability to demonstrate management of disease process will improve Outcome: Progressing   Problem: Activity: Goal: Capacity to carry out activities will improve Outcome: Progressing   Problem: Cardiac: Goal: Ability to achieve and maintain adequate cardiopulmonary perfusion will improve Outcome: Progressing   Problem: Clinical Measurements: Goal: Ability to maintain clinical measurements within normal limits will improve Outcome: Progressing   

## 2021-01-31 NOTE — H&P (View-Only) (Signed)
Advanced Heart Failure Rounding Note   Subjective:    Feeling ok. Denies CP or SOB. Headache improved.   Continues to diurese. Weight down 1 more pound. Remains in AF with RVR. On Eliquis,   BP remains high.  Wants to go home   Objective:   Weight Range:  Vital Signs:   Temp:  [97.3 F (36.3 C)-98.1 F (36.7 C)] 97.3 F (36.3 C) (10/07 0700) Pulse Rate:  [62-127] 127 (10/07 0700) Resp:  [18-96] 96 (10/07 0700) BP: (161-176)/(106-116) 176/115 (10/07 0700) SpO2:  [96 %-100 %] 96 % (10/07 0500) Weight:  [171.1 kg] 171.1 kg (10/07 0700) Last BM Date: 01/30/21  Weight change: Filed Weights   01/30/21 0617 01/31/21 0500 01/31/21 0700  Weight: (!) 171.5 kg (!) 171.1 kg (!) 171.1 kg    Intake/Output:   Intake/Output Summary (Last 24 hours) at 01/31/2021 0743 Last data filed at 01/31/2021 0218 Gross per 24 hour  Intake 1123.37 ml  Output 1900 ml  Net -776.63 ml      PHYSICAL EXAM: General:  Well appearing. No resp difficulty HEENT: normal Neck: supple. no JVD. Carotids 2+ bilat; no bruits. No lymphadenopathy or thryomegaly appreciated. Cor: PMI nondisplaced. Irregular tachy Lungs: clear Abdomen: soft, nontender, nondistended. No hepatosplenomegaly. No bruits or masses. Good bowel sounds. Extremities: no cyanosis, clubbing, rash, edema Neuro: alert & orientedx3, cranial nerves grossly intact. moves all 4 extremities w/o difficulty. Affect pleasant    Telemetry: AF w/ RVR 120-130s Personally reviewed   Labs: Basic Metabolic Panel: Recent Labs  Lab 01/26/21 1917 01/27/21 0222 01/28/21 0928 01/29/21 0354 01/29/21 1114 01/29/21 1118 01/30/21 0939 01/30/21 1500 01/31/21 0341  NA 131*   < > 138 137 145 143  145 136 137 137  K 3.4*   < > 3.6 3.3* 3.1* 3.3*  3.0* 5.4* 3.7 3.7  CL 99   < > 102 103  --   --  100 102 101  CO2 21*   < > 26 25  --   --  24 26 24   GLUCOSE 172*   < > 178* 140*  --   --  206* 188* 155*  BUN 17   < > 21* 24*  --   --  17 18 18    CREATININE 1.81*   < > 1.83* 1.85*  --   --  1.80* 1.99* 1.99*  CALCIUM 8.8*   < > 9.1 8.7*  --   --  9.2 9.2 9.3  MG 1.8  --   --   --   --   --  1.8  --   --    < > = values in this interval not displayed.     Liver Function Tests: No results for input(s): AST, ALT, ALKPHOS, BILITOT, PROT, ALBUMIN in the last 168 hours. No results for input(s): LIPASE, AMYLASE in the last 168 hours. No results for input(s): AMMONIA in the last 168 hours.  CBC: Recent Labs  Lab 01/26/21 1917 01/27/21 0222 01/29/21 1114 01/29/21 1118  WBC 13.6* 14.2*  --   --   HGB 12.1* 11.1* 11.2* 11.9*  11.6*  HCT 37.6* 34.8* 33.0* 35.0*  34.0*  MCV 80.5 80.6  --   --   PLT 330 306  --   --      Cardiac Enzymes: No results for input(s): CKTOTAL, CKMB, CKMBINDEX, TROPONINI in the last 168 hours.  BNP: BNP (last 3 results) Recent Labs    01/26/21 1942  BNP 318.4*  ProBNP (last 3 results) No results for input(s): PROBNP in the last 8760 hours.    Other results:  Imaging: CARDIAC CATHETERIZATION  Result Date: 01/29/2021   Prox RCA lesion is 20% stenosed.   Mid Cx to Dist Cx lesion is 30% stenosed.   Prox LAD to Mid LAD lesion is 30% stenosed. Findings: Ao = 163/108 (134) LV = 136/25 RA =  15 RV = 51/13 PA = 57/19 (39) PCW = 28 Fick cardiac output/index = 9.2/3.0 PVR = 1.2 WU Ao sat = 94% PA sat = 65%, 65% Assessment: 1. Mild non-obstructive CAD 2. NICM (likely hypertensive) with EF 35% by echo 3. Elevated filling pressures with normal CO 4. Severe systemic HTN Plan/Discussion: Resume diuresis.  Continue titration of GDMT. Arvilla Meres, MD 11:45 AM    Medications:     Scheduled Medications:  [MAR Hold] apixaban  5 mg Oral BID   [MAR Hold] atorvastatin  20 mg Oral QHS   [MAR Hold] cloNIDine  0.1 mg Oral BID   [MAR Hold] empagliflozin  10 mg Oral Daily   [MAR Hold] famotidine  20 mg Oral BID   [MAR Hold] furosemide  80 mg Intravenous BID   [MAR Hold] insulin aspart  0-5 Units  Subcutaneous QHS   [MAR Hold] insulin aspart  0-9 Units Subcutaneous TID WC   [MAR Hold] isosorbide-hydrALAZINE  1 tablet Oral TID   [MAR Hold] latanoprost  1 drop Both Eyes QHS   [MAR Hold] metoprolol succinate  100 mg Oral Daily   potassium chloride  40 mEq Oral Q4H   [MAR Hold] sacubitril-valsartan  1 tablet Oral BID   [MAR Hold] sodium chloride flush  3 mL Intravenous Q12H   [MAR Hold] sodium chloride flush  3 mL Intravenous Q12H    Infusions:  [MAR Hold] sodium chloride     sodium chloride     amiodarone 30 mg/hr (01/31/21 0723)    PRN Medications: [MAR Hold] sodium chloride, [MAR Hold] acetaminophen, [MAR Hold] ondansetron (ZOFRAN) IV, [MAR Hold] sodium chloride flush, [MAR Hold] traMADol, [MAR Hold] traZODone   Assessment/Plan:   1. A/C HFrEF - NICM. 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 ECHO this admit EF 30-35% with severe LVH. Doubt amyloid with uncontrolled hypertension.  - Has diuresed well. Can switch IV lasix to po lasix 80 daily - Continue to wean clonidine and add GDMT - Increase Toprol XL 100 mg daily - Continue Bidil1 tab tid - Increase spiro to 25 daily - Increase Entresto to 97/103 bid  -Continue Jardiance 10   2. Uncontrolled Hypertension   -Remains hypertensive. Plan as above - encouraged full compliance with CPAP   3. A Fib RVR - continue amio gtt.  - Continue Toprol 100 - Continue Eliquis 5 mg bid  - TEE/DC-CV today - Keep K > 4.0 and Mg > 2.0    4. CKD Stage IIIb  - Creatinine baseline 1.7-1.9 - Creatinine 1.7>2.3>1.8> 1.99   - Followed by twice a year by Nephrology in [place].    5 DMII -Hgb A1C 6.3 - On SSI.  - Off amaryl with reduced EF.  - on Jardiance 10    6. OSA -Using CPAP 3 times a week.  -Discussed and needs to use CPAP nightly.    7. Anemia, iron-def  -Iron Sats 6%  -Will give Feraheme  8. CAD - mild nonobstructive on cath - needs aggressive risk factor modification w/ better BP control  and lipid reduction  - c/w statin,  LDL Goal < 70  - on ? blocker - no ASA w/ Eliquis   IF DC-CV uncomplicated and we can get BP down may be able to get him home today.  Meds for d/c Apixaban 5 bid Amio 200 bid Entresto 97/103 bid Jardiance 10  Spiro 25 daily  Toprol 100 daily Clonidine 0.2 bid (can wean off as outpatient) Bidil 1 tab tid Atorva 20 daily  Lasix 80 daily.    F/I HF Clinic next week   Length of Stay: 4   Arvilla Meres MD 01/31/2021, 7:43 AM  Advanced Heart Failure Team Pager [phone] (M-F; 7a - 4p)  Please contact [facility] for night-coverage after hours (4p -7a ) and weekends on [email or web address]

## 2021-01-31 NOTE — CV Procedure (Signed)
   TRANSESOPHAGEAL ECHOCARDIOGRAM GUIDED DIRECT CURRENT CARDIOVERSION  NAME:  Randy [name]   MRN: [id number] DOB:  1971-01-09   ADMIT DATE: 01/26/2021  INDICATIONS:  Atrial fibrillation  PROCEDURE:   Informed consent was obtained prior to the procedure. The risks, benefits and alternatives for the procedure were discussed and the patient comprehended these risks.  Risks include, but are not limited to, cough, sore throat, vomiting, nausea, somnolence, esophageal and stomach trauma or perforation, bleeding, low blood pressure, aspiration, pneumonia, infection, trauma to the teeth and death.    After a procedural time-out, the oropharynx was anesthetized and the patient was sedated by the anesthesia service. The transesophageal probe was inserted in the esophagus and stomach without difficulty and multiple views were obtained.   FINDINGS:  Limited images due to tenuous respiratory status  LEFT VENTRICLE: EF =  30-35% + LVH  RIGHT VENTRICLE: Mod reduced  LEFT ATRIUM: Severely dilated + Smoke   LEFT ATRIAL APPENDAGE: No clot  RIGHT ATRIUM: Moderately dilated  AORTIC VALVE:  [place].   MITRAL VALVE:    Normal trivial MR  TRICUSPID VALVE: Normal. No color interrogation   PULMONIC VALVE: Grossly normal  INTERATRIAL SEPTUM: No PFO/ASD  PERICARDIUM: No effusion  DESCENDING AORTA: Notvisualized   CARDIOVERSION:     Indications:  Atrial Fibrillation  Procedure Details:  Once the TEE was complete, the patient had the defibrillator pads placed in the anterior and posterior position. Once an appropriate level of sedation was achieved, the patient received a single biphasic, synchronized 200J shock with prompt conversion to sinus rhythm. No apparent complications.   Arvilla Meres, MD  8:11 AM

## 2021-01-31 NOTE — Progress Notes (Signed)
Went over discharge instructions with patient. Patient verbalize understanding of medication changes and follow up appointments. PIVs x 2 removed and patient tolerated well. Site is clean, dry and intact. Tele monitor removed and CCMD has been notified. Patient made aware that medications has been sent to [facility] pharmacy in [place].

## 2021-01-31 NOTE — Telephone Encounter (Signed)
Advanced Heart Failure Patient Advocate Encounter  Prior Authorization for Sherryll Burger has been approved.    PA# [id number] Effective dates: 01/30/21 through 01/30/22  Patients co-pay is $60 (insurance allows 30 day billing)  [facility]) aware that the patient can use a $10 co-pay card.  Archer Asa, CPhT

## 2021-01-31 NOTE — Transfer of Care (Signed)
Immediate Anesthesia Transfer of Care Note  Patient: Randy [name]  Procedure(s) Performed: TRANSESOPHAGEAL ECHOCARDIOGRAM (TEE) CARDIOVERSION  Patient Location: Cath Lab  Anesthesia Type:MAC  Level of Consciousness: awake, alert , oriented and patient cooperative  Airway & Oxygen Therapy: Patient Spontanous Breathing and Patient connected to nasal cannula oxygen  Post-op Assessment: Report given to RN and Post -op Vital signs reviewed and stable  Post vital signs: Reviewed  Last Vitals:  Vitals Value Taken Time  BP 176/117 01/31/21 0822  Temp 36.5 C 01/31/21 0822  Pulse 51 01/31/21 0825  Resp 23 01/31/21 0825  SpO2 97 % 01/31/21 0825  Vitals shown include unvalidated device data.  Last Pain:  Vitals:   01/31/21 0822  TempSrc: Temporal  PainSc: 0-No pain         Complications: No notable events documented.

## 2021-01-31 NOTE — Anesthesia Procedure Notes (Signed)
Procedure Name: MAC Date/Time: 01/31/2021 7:50 AM Performed by: Jenne Campus, CRNA Pre-anesthesia Checklist: Patient identified, Emergency Drugs available, Suction available and Patient being monitored Oxygen Delivery Method: Simple face mask

## 2021-01-31 NOTE — Interval H&P Note (Signed)
History and Physical Interval Note:  01/31/2021 7:53 AM  Randy [name]  has presented today for surgery, with the diagnosis of afib.  The various methods of treatment have been discussed with the patient and family. After consideration of risks, benefits and other options for treatment, the patient has consented to  Procedure(s): TRANSESOPHAGEAL ECHOCARDIOGRAM (TEE) (N/A) CARDIOVERSION (N/A) as a surgical intervention.  The patient's history has been reviewed, patient examined, no change in status, stable for surgery.  I have reviewed the patient's chart and labs.  Questions were answered to the patient's satisfaction.     [name]

## 2021-01-31 NOTE — Progress Notes (Signed)
PROGRESS NOTE    Randy [name]  ZJQ:[id number] DOB: Jun 12, 1970 DOA: 01/26/2021 PCP: [facility]    Chief Complaint  Patient presents with   Shortness of Breath    Brief Narrative:  50 year old male with history of chronic systolic CHF, permanent atrial fibrillation on Eliquis, hypertension, hypertensive retinopathy, CKD stage III, hyperlipidemia, diabetes mellitus type 2, history of Bell's palsy, OSA, prior strokes came to ED with worsening shortness of breath.  BNP was 318.  Chest x-ray showed pulmonary edema.   Assessment & Plan:   Principal Problem:   New onset of congestive heart failure (HCC) Active Problems:   Permanent atrial fibrillation (HCC)   SIRS (systemic inflammatory response syndrome) (HCC)   HTN (hypertension)   Hypokalemia   Acute systolic CHF (congestive heart failure) (HCC)   Acute on chronic systolic CHF:  Diuresed  with IV lasix 40 mg every 12 hours and transitioned to oral lasix 80 mg daily starting tomorrow  .  Cardiology on board and appreciate input.  Echocardiogram showing reduced LVef of 30 to 35%,global hypokinesis.  Currently on bidil 1 tab TID.  and metoprolol 100 mg daily, entresto 1 tab BID.  Diuresed about 6.1 lit since admission. Underwent LHC and RHC on 01/29/21,. Mild obstructive CAD.    Hypertension:  Sub optimally controlled.  On clonidine, metoprolol and bidil, spironolactone and entresto added     Hyperlipidemia:  Resume statin.    Hypokalemia:  Replaced.   Hyperkalemia:  Resolved.    Permanent atrial fibrillation  Rate controlled.  On amiodarone and metoprolol for rate control and on eliquis for anti coagulation.  Underwent DCCV this am, converted to sinus.   Leukocytosis:  Unclear etiology.  Probably reactive.  CXR showed pulm edema.  No urinary symptoms.  Recheck cbc  in am.     Diabetes Mellitus:  Non insulin dependent,  On metformin and glimepiride at home which are on hold for now.  CBG  (last 3)  Recent Labs    01/31/21 0524 01/31/21 0902 01/31/21 1142  GLUCAP 148* 173* 234*    Last A1c is 6.3%.   Iron deficiency anemia:  Hemoglobin is stable around 11.    GERD Stable.    Stage 3 a CKD: Creatinine at baseline around 1.8. currently at 1.9.   Elevated troponin from demand ischemia in the setting of decompensated CHF and atrial fib.      DVT prophylaxis: (Eliquis) Code Status: (Full code) Family Communication: none at bedside.  Disposition:   Status is: Inpatient  Remains inpatient appropriate because:Ongoing diagnostic testing needed not appropriate for outpatient work up and IV treatments appropriate due to intensity of illness or inability to take PO  Dispo: The patient is from: Home              Anticipated d/c is to: Home              Patient currently is not medically stable to d/c.   Difficult to place patient No       Consultants:   Cardiology.   Procedures: Right and left heart cath.   Antimicrobials: none   Subjective: Reports feeling mmuch better.   Objective: Vitals:   01/31/21 1231 01/31/21 1300 01/31/21 1330 01/31/21 1351  BP: 138/88 126/75 133/86 (!) 152/108  Pulse: 76 70 76 73  Resp:    18  Temp:    98 F (36.7 C)  TempSrc:      SpO2:    98%  Weight:  Height:        Intake/Output Summary (Last 24 hours) at 01/31/2021 1402 Last data filed at 01/31/2021 1401 Gross per 24 hour  Intake 843.37 ml  Output 2275 ml  Net -1431.63 ml    Filed Weights   01/30/21 0617 01/31/21 0500 01/31/21 0700  Weight: (!) 171.5 kg (!) 171.1 kg (!) 171.1 kg    Examination: General exam: Appears calm and comfortable  Respiratory system: Clear to auscultation. Respiratory effort normal. Cardiovascular system: S1 & S2 heard, RRR. JVD +, pedal edema improving.  Gastrointestinal system: Abdomen is nondistended, soft and nontender. Normal bowel sounds heard. Central nervous system: Alert and oriented. No focal neurological  deficits. Extremities: improving pedal edema.  Skin: No rashes, lesions or ulcers Psychiatry: Mood & affect appropriate.      Data Reviewed: I have personally reviewed following labs and imaging studies  CBC: Recent Labs  Lab 01/26/21 1917 01/27/21 0222 01/29/21 1114 01/29/21 1118  WBC 13.6* 14.2*  --   --   HGB 12.1* 11.1* 11.2* 11.9*  11.6*  HCT 37.6* 34.8* 33.0* 35.0*  34.0*  MCV 80.5 80.6  --   --   PLT 330 306  --   --      Basic Metabolic Panel: Recent Labs  Lab 01/26/21 1917 01/27/21 0222 01/28/21 0928 01/29/21 0354 01/29/21 1114 01/29/21 1118 01/30/21 0939 01/30/21 1500 01/31/21 0341  NA 131*   < > 138 137 145 143  145 136 137 137  K 3.4*   < > 3.6 3.3* 3.1* 3.3*  3.0* 5.4* 3.7 3.7  CL 99   < > 102 103  --   --  100 102 101  CO2 21*   < > 26 25  --   --  24 26 24   GLUCOSE 172*   < > 178* 140*  --   --  206* 188* 155*  BUN 17   < > 21* 24*  --   --  17 18 18   CREATININE 1.81*   < > 1.83* 1.85*  --   --  1.80* 1.99* 1.99*  CALCIUM 8.8*   < > 9.1 8.7*  --   --  9.2 9.2 9.3  MG 1.8  --   --   --   --   --  1.8  --   --    < > = values in this interval not displayed.     GFR: Estimated Creatinine Clearance: 80.9 mL/min (A) (by C-G formula based on SCr of 1.99 mg/dL (H)).  Liver Function Tests: No results for input(s): AST, ALT, ALKPHOS, BILITOT, PROT, ALBUMIN in the last 168 hours.  CBG: Recent Labs  Lab 01/30/21 1635 01/30/21 2027 01/31/21 0524 01/31/21 0902 01/31/21 1142  GLUCAP 187* 184* 148* 173* 234*      Recent Results (from the past 240 hour(s))  Resp Panel by RT-PCR (Flu A&B, Covid) Nasopharyngeal Swab     Status: None   Collection Time: 01/26/21  7:24 PM   Specimen: Nasopharyngeal Swab; Nasopharyngeal(NP) swabs in vial transport medium  Result Value Ref Range Status   SARS Coronavirus 2 by RT PCR NEGATIVE NEGATIVE Final    Comment: (NOTE) SARS-CoV-2 target nucleic acids are NOT DETECTED.  The SARS-CoV-2 RNA is generally  detectable in upper respiratory specimens during the acute phase of infection. The lowest concentration of SARS-CoV-2 viral copies this assay can detect is 138 copies/mL. A negative result does not preclude SARS-Cov-2 infection and should not be used as the  sole basis for treatment or other patient management decisions. A negative result may occur with  improper specimen collection/handling, submission of specimen other than nasopharyngeal swab, presence of viral mutation(s) within the areas targeted by this assay, and inadequate number of viral copies(<138 copies/mL). A negative result must be combined with clinical observations, patient history, and epidemiological information. The expected result is Negative.  Fact Sheet for Patients:  BloggerCourse.com  Fact Sheet for Healthcare Providers:  SeriousBroker.it  This test is no t yet approved or cleared by the [place] FDA and  has been authorized for detection and/or diagnosis of SARS-CoV-2 by FDA under an Emergency Use Authorization (EUA). This EUA will remain  in effect (meaning this test can be used) for the duration of the COVID-19 declaration under Section 564(b)(1) of the Act, 21 U.S.C.section 360bbb-3(b)(1), unless the authorization is terminated  or revoked sooner.       Influenza A by PCR NEGATIVE NEGATIVE Final   Influenza B by PCR NEGATIVE NEGATIVE Final    Comment: (NOTE) The Xpert Xpress SARS-CoV-2/FLU/RSV plus assay is intended as an aid in the diagnosis of influenza from Nasopharyngeal swab specimens and should not be used as a sole basis for treatment. Nasal washings and aspirates are unacceptable for Xpert Xpress SARS-CoV-2/FLU/RSV testing.  Fact Sheet for Patients: BloggerCourse.com  Fact Sheet for Healthcare Providers: SeriousBroker.it  This test is not yet approved or cleared by the [place] FDA  and has been authorized for detection and/or diagnosis of SARS-CoV-2 by FDA under an Emergency Use Authorization (EUA). This EUA will remain in effect (meaning this test can be used) for the duration of the COVID-19 declaration under Section 564(b)(1) of the Act, 21 U.S.C. section 360bbb-3(b)(1), unless the authorization is terminated or revoked.  Performed at [facility], [address], [place], [place] [address]           Radiology Studies: No results found.      Scheduled Meds:  apixaban  5 mg Oral BID   atorvastatin  20 mg Oral QHS   cloNIDine  0.1 mg Oral BID   empagliflozin  10 mg Oral Daily   famotidine  20 mg Oral BID   [START ON 02/01/2021] furosemide  80 mg Oral Daily   insulin aspart  0-5 Units Subcutaneous QHS   insulin aspart  0-9 Units Subcutaneous TID WC   isosorbide-hydrALAZINE  1 tablet Oral TID   latanoprost  1 drop Both Eyes QHS   metoprolol succinate  100 mg Oral Daily   sacubitril-valsartan  1 tablet Oral BID   sodium chloride flush  3 mL Intravenous Q12H   sodium chloride flush  3 mL Intravenous Q12H   spironolactone  25 mg Oral Daily   Continuous Infusions:  sodium chloride     amiodarone 30 mg/hr (01/31/21 0741)     LOS: 4 days        Kathlen Mody, MD [facility]   To contact the attending provider between 7A-7P or the covering provider during after hours 7P-7A, please log into the web site [email or web address] and access using universal [place] password for that web site. If you do not have the password, please call the hospital operator.  01/31/2021, 2:02 PM

## 2021-01-31 NOTE — Anesthesia Postprocedure Evaluation (Signed)
Anesthesia Post Note  Patient: Randy [name]  Procedure(s) Performed: TRANSESOPHAGEAL ECHOCARDIOGRAM (TEE) CARDIOVERSION     Patient location during evaluation: PACU Anesthesia Type: MAC Level of consciousness: awake and alert and oriented Pain management: pain level controlled Vital Signs Assessment: post-procedure vital signs reviewed and stable Respiratory status: spontaneous breathing, nonlabored ventilation and respiratory function stable Cardiovascular status: blood pressure returned to baseline Postop Assessment: no apparent nausea or vomiting Anesthetic complications: no   No notable events documented.  Last Vitals:  Vitals:   01/31/21 0822 01/31/21 0829  BP: (!) 176/117 (!) 186/102  Pulse: 85 (!) 54  Resp: (!) 22 (!) 22  Temp: 36.5 C   SpO2: 99% 99%    Last Pain:  Vitals:   01/31/21 0829  TempSrc:   PainSc: 0-No pain                 Marthenia Rolling

## 2021-01-31 NOTE — TOC CM/SW Note (Signed)
HF TOC CM spoke to pt and scheduled dc home this evening or in am. Contacted his [facility] and they close at 9 pm this evening. He is requesting a note for work. Updated attending. Isidoro Donning RN3 CCM, Heart Failure TOC CM [phone]

## 2021-01-31 NOTE — Progress Notes (Signed)
Notified dr Stephannie Peters of high blood pressure.  She is ok with sending pt back to unit to monitor there.

## 2021-01-31 NOTE — Progress Notes (Addendum)
Advanced Heart Failure Rounding Note   Subjective:    Feeling ok. Denies CP or SOB. Headache improved.   Continues to diurese. Weight down 1 more pound. Remains in AF with RVR. On Eliquis,   BP remains high.  Wants to go home   Objective:   Weight Range:  Vital Signs:   Temp:  [97.3 F (36.3 C)-98.1 F (36.7 C)] 97.3 F (36.3 C) (10/07 0700) Pulse Rate:  [62-127] 127 (10/07 0700) Resp:  [18-96] 96 (10/07 0700) BP: (161-176)/(106-116) 176/115 (10/07 0700) SpO2:  [96 %-100 %] 96 % (10/07 0500) Weight:  [171.1 kg] 171.1 kg (10/07 0700) Last BM Date: 01/30/21  Weight change: Filed Weights   01/30/21 0617 01/31/21 0500 01/31/21 0700  Weight: (!) 171.5 kg (!) 171.1 kg (!) 171.1 kg    Intake/Output:   Intake/Output Summary (Last 24 hours) at 01/31/2021 0743 Last data filed at 01/31/2021 0218 Gross per 24 hour  Intake 1123.37 ml  Output 1900 ml  Net -776.63 ml      PHYSICAL EXAM: General:  Well appearing. No resp difficulty HEENT: normal Neck: supple. no JVD. Carotids 2+ bilat; no bruits. No lymphadenopathy or thryomegaly appreciated. Cor: PMI nondisplaced. Irregular tachy Lungs: clear Abdomen: soft, nontender, nondistended. No hepatosplenomegaly. No bruits or masses. Good bowel sounds. Extremities: no cyanosis, clubbing, rash, edema Neuro: alert & orientedx3, cranial nerves grossly intact. moves all 4 extremities w/o difficulty. Affect pleasant    Telemetry: AF w/ RVR 120-130s Personally reviewed   Labs: Basic Metabolic Panel: Recent Labs  Lab 01/26/21 1917 01/27/21 0222 01/28/21 0928 01/29/21 0354 01/29/21 1114 01/29/21 1118 01/30/21 0939 01/30/21 1500 01/31/21 0341  NA 131*   < > 138 137 145 143  145 136 137 137  K 3.4*   < > 3.6 3.3* 3.1* 3.3*  3.0* 5.4* 3.7 3.7  CL 99   < > 102 103  --   --  100 102 101  CO2 21*   < > 26 25  --   --  24 26 24   GLUCOSE 172*   < > 178* 140*  --   --  206* 188* 155*  BUN 17   < > 21* 24*  --   --  17 18 18    CREATININE 1.81*   < > 1.83* 1.85*  --   --  1.80* 1.99* 1.99*  CALCIUM 8.8*   < > 9.1 8.7*  --   --  9.2 9.2 9.3  MG 1.8  --   --   --   --   --  1.8  --   --    < > = values in this interval not displayed.     Liver Function Tests: No results for input(s): AST, ALT, ALKPHOS, BILITOT, PROT, ALBUMIN in the last 168 hours. No results for input(s): LIPASE, AMYLASE in the last 168 hours. No results for input(s): AMMONIA in the last 168 hours.  CBC: Recent Labs  Lab 01/26/21 1917 01/27/21 0222 01/29/21 1114 01/29/21 1118  WBC 13.6* 14.2*  --   --   HGB 12.1* 11.1* 11.2* 11.9*  11.6*  HCT 37.6* 34.8* 33.0* 35.0*  34.0*  MCV 80.5 80.6  --   --   PLT 330 306  --   --      Cardiac Enzymes: No results for input(s): CKTOTAL, CKMB, CKMBINDEX, TROPONINI in the last 168 hours.  BNP: BNP (last 3 results) Recent Labs    01/26/21 1942  BNP 318.4*  ProBNP (last 3 results) No results for input(s): PROBNP in the last 8760 hours.    Other results:  Imaging: CARDIAC CATHETERIZATION  Result Date: 01/29/2021   Prox RCA lesion is 20% stenosed.   Mid Cx to Dist Cx lesion is 30% stenosed.   Prox LAD to Mid LAD lesion is 30% stenosed. Findings: Ao = 163/108 (134) LV = 136/25 RA =  15 RV = 51/13 PA = 57/19 (39) PCW = 28 Fick cardiac output/index = 9.2/3.0 PVR = 1.2 WU Ao sat = 94% PA sat = 65%, 65% Assessment: 1. Mild non-obstructive CAD 2. NICM (likely hypertensive) with EF 35% by echo 3. Elevated filling pressures with normal CO 4. Severe systemic HTN Plan/Discussion: Resume diuresis.  Continue titration of GDMT. Arvilla Meres, MD 11:45 AM    Medications:     Scheduled Medications:  [MAR Hold] apixaban  5 mg Oral BID   [MAR Hold] atorvastatin  20 mg Oral QHS   [MAR Hold] cloNIDine  0.1 mg Oral BID   [MAR Hold] empagliflozin  10 mg Oral Daily   [MAR Hold] famotidine  20 mg Oral BID   [MAR Hold] furosemide  80 mg Intravenous BID   [MAR Hold] insulin aspart  0-5 Units  Subcutaneous QHS   [MAR Hold] insulin aspart  0-9 Units Subcutaneous TID WC   [MAR Hold] isosorbide-hydrALAZINE  1 tablet Oral TID   [MAR Hold] latanoprost  1 drop Both Eyes QHS   [MAR Hold] metoprolol succinate  100 mg Oral Daily   potassium chloride  40 mEq Oral Q4H   [MAR Hold] sacubitril-valsartan  1 tablet Oral BID   [MAR Hold] sodium chloride flush  3 mL Intravenous Q12H   [MAR Hold] sodium chloride flush  3 mL Intravenous Q12H    Infusions:  [MAR Hold] sodium chloride     sodium chloride     amiodarone 30 mg/hr (01/31/21 0723)    PRN Medications: [MAR Hold] sodium chloride, [MAR Hold] acetaminophen, [MAR Hold] ondansetron (ZOFRAN) IV, [MAR Hold] sodium chloride flush, [MAR Hold] traMADol, [MAR Hold] traZODone   Assessment/Plan:   1. A/C HFrEF - NICM. 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 ECHO this admit EF 30-35% with severe LVH. Doubt amyloid with uncontrolled hypertension.  - Has diuresed well. Can switch IV lasix to po lasix 80 daily - Continue to wean clonidine and add GDMT - Increase Toprol XL 100 mg daily - Continue Bidil1 tab tid - Increase spiro to 25 daily - Increase Entresto to 97/103 bid  -Continue Jardiance 10   2. Uncontrolled Hypertension   -Remains hypertensive. Plan as above - encouraged full compliance with CPAP   3. A Fib RVR - continue amio gtt.  - Continue Toprol 100 - Continue Eliquis 5 mg bid  - TEE/DC-CV today - Keep K > 4.0 and Mg > 2.0    4. CKD Stage IIIb  - Creatinine baseline 1.7-1.9 - Creatinine 1.7>2.3>1.8> 1.99   - Followed by twice a year by Nephrology in [place].    5 DMII -Hgb A1C 6.3 - On SSI.  - Off amaryl with reduced EF.  - on Jardiance 10    6. OSA -Using CPAP 3 times a week.  -Discussed and needs to use CPAP nightly.    7. Anemia, iron-def  -Iron Sats 6%  -Will give Feraheme  8. CAD - mild nonobstructive on cath - needs aggressive risk factor modification w/ better BP control  and lipid reduction  - c/w statin,  LDL Goal < 70  - on ? blocker - no ASA w/ Eliquis   IF DC-CV uncomplicated and we can get BP down may be able to get him home today.  Meds for d/c Apixaban 5 bid Amio 200 bid Entresto 97/103 bid Jardiance 10  Spiro 25 daily  Toprol 100 daily Clonidine 0.1 bid (can wean off as outpatient) Bidil 1 tab tid Atorva 20 daily  Lasix 80 daily.    F/I HF Clinic next week   Length of Stay: 4   Arvilla Meres MD 01/31/2021, 7:43 AM  Advanced Heart Failure Team Pager [phone] (M-F; 7a - 4p)  Please contact [facility] for night-coverage after hours (4p -7a ) and weekends on [email or web address]

## 2021-02-02 ENCOUNTER — Encounter (HOSPITAL_COMMUNITY): Payer: Self-pay | Admitting: Internal Medicine

## 2021-02-03 ENCOUNTER — Other Ambulatory Visit (HOSPITAL_COMMUNITY): Payer: Self-pay

## 2021-02-03 NOTE — Discharge Summary (Signed)
Physician Discharge Summary  Randy [name] DHW:[id number] DOB: Jun 24, 1970 DOA: 01/26/2021  PCP: [facility]  Admit date: 01/26/2021 Discharge date: 01/31/2021  Admitted From: Home.  Disposition:  Home.   Recommendations for Outpatient Follow-up:  Follow up with PCP in 1-2 weeks Please obtain BMP/CBC in one week Please follow up with cardiology as recommended.   Discharge Condition:stable.  CODE STATUS: full code.  Diet recommendation: Heart Healthy / Carb Modified   Brief/Interim Summary: 50 year old male with history of chronic systolic CHF, permanent atrial fibrillation on Eliquis, hypertension, hypertensive retinopathy, CKD stage III, hyperlipidemia, diabetes mellitus type 2, history of Bell's palsy, OSA, prior strokes came to ED with worsening shortness of breath.  BNP was 318.  Chest x-ray showed pulmonary edema.  Discharge Diagnoses:  Principal Problem:   New onset of congestive heart failure (HCC) Active Problems:   Permanent atrial fibrillation (HCC)   SIRS (systemic inflammatory response syndrome) (HCC)   HTN (hypertension)   Hypokalemia   Acute systolic CHF (congestive heart failure) (HCC)  Acute on chronic systolic CHF:  Diuresed  with IV lasix 40 mg every 12 hours and transitioned to oral lasix 80 mg daily starting tomorrow  .  Cardiology on board and appreciate input.  Echocardiogram showing reduced LVef of 30 to 35%,global hypokinesis.  Currently on bidil 1 tab TID.  and metoprolol 100 mg daily, entresto 1 tab BID.  Diuresed about 6.1 lit since admission. Underwent LHC and RHC on 01/29/21,. Mild obstructive CAD.      Hypertension:  Better controlled today.  On clonidine, metoprolol and bidil, spironolactone and entresto added        Hyperlipidemia:  Resume statin.      Hypokalemia:  Replaced.    Hyperkalemia:  Resolved.      Permanent atrial fibrillation  Rate controlled.  On amiodarone and metoprolol for rate control and on eliquis  for anti coagulation.  Underwent DCCV this am, converted to sinus. he was cleared for discharge home.    Leukocytosis:  Unclear etiology.  Probably reactive.  CXR showed pulm edema.  No urinary symptoms.  Recheck cbc  in am.        Diabetes Mellitus:  Non insulin dependent,  Diet control. Resume home meds on discharge.  Last A1c is 6.3%.    Iron deficiency anemia:  Hemoglobin is stable around 11.      GERD Stable.      Stage 3 a CKD: Creatinine at baseline around 1.8. currently at 1.9.     Elevated troponin from demand ischemia in the setting of decompensated CHF and atrial fib.       Discharge Instructions  Discharge Instructions     (HEART FAILURE PATIENTS) Call MD:  Anytime you have any of the following symptoms: 1) 3 pound weight gain in 24 hours or 5 pounds in 1 week 2) shortness of breath, with or without a dry hacking cough 3) swelling in the hands, feet or stomach 4) if you have to sleep on extra pillows at night in order to breathe.   Complete by: As directed    Diet - low sodium heart healthy   Complete by: As directed    Increase activity slowly   Complete by: As directed       Allergies as of 01/31/2021       Reactions   Fish-derived Products Anaphylaxis        Medication List     STOP taking these medications    aspirin EC 81 MG  tablet   diltiazem 240 MG 24 hr capsule Commonly known as: CARDIZEM CD   labetalol 100 MG tablet Commonly known as: NORMODYNE   labetalol 300 MG tablet Commonly known as: NORMODYNE   losartan 100 MG tablet Commonly known as: COZAAR   metFORMIN 500 MG tablet Commonly known as: GLUCOPHAGE   NIFEdipine 60 MG 24 hr tablet Commonly known as: ADALAT CC       TAKE these medications    amiodarone 200 MG tablet Commonly known as: PACERONE Take 1 tablet (200 mg total) by mouth daily.   apixaban 5 MG Tabs tablet Commonly known as: ELIQUIS Take 1 tablet (5 mg total) by mouth 2 (two) times daily.    atorvastatin 20 MG tablet Commonly known as: LIPITOR Take 20 mg by mouth at bedtime.   cloNIDine 0.1 MG tablet Commonly known as: CATAPRES Take 1 tablet (0.1 mg total) by mouth 2 (two) times daily. What changed:  medication strength how much to take   empagliflozin 10 MG Tabs tablet Commonly known as: JARDIANCE Take 1 tablet (10 mg total) by mouth daily.   famotidine 20 MG tablet Commonly known as: PEPCID Take 1 tablet (20 mg total) by mouth 2 (two) times daily.   furosemide 80 MG tablet Commonly known as: LASIX Take 1 tablet (80 mg total) by mouth daily.   glimepiride 4 MG tablet Commonly known as: AMARYL Take 4 mg by mouth daily with breakfast. What changed: Another medication with the same name was removed. Continue taking this medication, and follow the directions you see here.   hydrOXYzine 25 MG tablet Commonly known as: ATARAX/VISTARIL Take 1 tablet (25 mg total) by mouth every 6 (six) hours. What changed:  when to take this reasons to take this   isosorbide-hydrALAZINE 20-37.5 MG tablet Commonly known as: BIDIL Take 1 tablet by mouth 3 (three) times daily.   latanoprost 0.005 % ophthalmic solution Commonly known as: XALATAN Place 1 drop into both eyes at bedtime.   metoprolol succinate 100 MG 24 hr tablet Commonly known as: TOPROL-XL Take 1 tablet (100 mg total) by mouth daily. Take with or immediately following a meal. What changed:  medication strength how much to take   sacubitril-valsartan 97-103 MG Commonly known as: ENTRESTO Take 1 tablet by mouth 2 (two) times daily.   spironolactone 25 MG tablet Commonly known as: ALDACTONE Take 1 tablet (25 mg total) by mouth daily.   Vitamin D (Ergocalciferol) 1.25 MG (50000 UNIT) Caps capsule Commonly known as: DRISDOL Take 1 capsule by mouth every Monday.        Follow-up Information     [facility] Follow up on 02/11/2021.   Specialty:  Cardiology Why: at 1100. [facility] . Entrance C. Contact information: [address] [id number] mc [place] [address] [phone]               Allergies  Allergen Reactions   Fish-Derived Products Anaphylaxis    Consultations: Cardiology.    Procedures/Studies: DG Chest 2 View  Result Date: 01/26/2021 CLINICAL DATA:  Sudden onset shortness of breath. EXAM: CHEST - 2 VIEW COMPARISON:  09/25/2015 FINDINGS: Heart is enlarged. There is pulmonary vascular congestion and mild, diffuse interstitial opacities primarily at the bases. Small bilateral pleural effusions are noted. No focal opacities. IMPRESSION: Findings consistent with pulmonary edema. Electronically Signed   By: Norva Pavlov M.D.   On: 01/26/2021 19:47   CARDIAC CATHETERIZATION  Result  Date: 01/29/2021   Prox RCA lesion is 20% stenosed.   Mid Cx to Dist Cx lesion is 30% stenosed.   Prox LAD to Mid LAD lesion is 30% stenosed. Findings: Ao = 163/108 (134) LV = 136/25 RA =  15 RV = 51/13 PA = 57/19 (39) PCW = 28 Fick cardiac output/index = 9.2/3.0 PVR = 1.2 WU Ao sat = 94% PA sat = 65%, 65% Assessment: 1. Mild non-obstructive CAD 2. NICM (likely hypertensive) with EF 35% by echo 3. Elevated filling pressures with normal CO 4. Severe systemic HTN Plan/Discussion: Resume diuresis.  Continue titration of GDMT. Arvilla Meres, MD 11:45 AM  ECHOCARDIOGRAM COMPLETE  Result Date: 01/27/2021    ECHOCARDIOGRAM REPORT   Patient Name:   Randy [name] Date of Exam: 01/27/2021 Medical Rec #:  [id number]      Height:       80.0 in Accession #:    [id number]     Weight:       397.9 lb Date of Birth:  1970-12-15      BSA:          3.080 m Patient Age:    50 years       BP:           151/114 mmHg Patient Gender: M              HR:           74 bpm. Exam Location:  Inpatient Procedure: 2D Echo, 3D Echo, Color Doppler and Cardiac Doppler Indications:    I50.21 Acute systolic (congestive)  heart failure  History:        Patient has prior history of Echocardiogram examinations, most                 recent 12/04/2019. CHF, Arrythmias:Atrial Fibrillation; Risk                 Factors:Diabetes and Hypertension. Prior performed at [facility].  Sonographer:    Irving Burton Senior RDCS Referring Phys: [id number] VASUNDHRA RATHORE IMPRESSIONS  1. Myocardium with speckled appearance; consider amyloid.  2. Left ventricular ejection fraction, by estimation, is 30 to 35%. The left ventricle has moderately decreased function. The left ventricle demonstrates global hypokinesis. The left ventricular internal cavity size was mildly dilated. There is severe left ventricular hypertrophy. Left ventricular diastolic function could not be evaluated.  3. Right ventricular systolic function is normal. The right ventricular size is normal. There is moderately elevated pulmonary artery systolic pressure.  4. Left atrial size was severely dilated.  5. Right atrial size was mildly dilated.  6. The mitral valve is normal in structure. Mild mitral valve regurgitation. No evidence of mitral stenosis.  7. The aortic valve is tricuspid. Aortic valve regurgitation is not visualized. No aortic stenosis is present.  8. Aortic dilatation noted. There is borderline dilatation of the aortic root, measuring 38 mm. There is mild dilatation of the ascending aorta, measuring 43 mm.  9. The inferior vena cava is dilated in size with <50% respiratory variability, suggesting right atrial pressure of 15 mmHg. FINDINGS  Left Ventricle: Left ventricular ejection fraction, by estimation, is 30 to 35%. The left ventricle has moderately decreased function. The left ventricle demonstrates global hypokinesis. The left ventricular internal cavity size was mildly dilated. There is severe left ventricular hypertrophy. Left ventricular diastolic function could not be evaluated due to atrial fibrillation. Left ventricular diastolic function could not be evaluated. Right  Ventricle: The right ventricular size is normal. Right  ventricular systolic function is normal. There is moderately elevated pulmonary artery systolic pressure. The tricuspid regurgitant velocity is 2.87 m/s, and with an assumed right atrial pressure of 15 mmHg, the estimated right ventricular systolic pressure is 47.9 mmHg. Left Atrium: Left atrial size was severely dilated. Right Atrium: Right atrial size was mildly dilated. Pericardium: There is no evidence of pericardial effusion. Mitral Valve: The mitral valve is normal in structure. Mild mitral valve regurgitation. No evidence of mitral valve stenosis. Tricuspid Valve: The tricuspid valve is normal in structure. Tricuspid valve regurgitation is mild . No evidence of tricuspid stenosis. Aortic Valve: The aortic valve is tricuspid. Aortic valve regurgitation is not visualized. No aortic stenosis is present. Pulmonic Valve: The pulmonic valve was normal in structure. Pulmonic valve regurgitation is mild. No evidence of pulmonic stenosis. Aorta: Aortic dilatation noted. There is borderline dilatation of the aortic root, measuring 38 mm. There is mild dilatation of the ascending aorta, measuring 43 mm. Venous: The inferior vena cava is dilated in size with less than 50% respiratory variability, suggesting right atrial pressure of 15 mmHg. IAS/Shunts: No atrial level shunt detected by color flow Doppler. Additional Comments: Myocardium with speckled appearance; consider amyloid.  LEFT VENTRICLE PLAX 2D LVIDd:         5.80 cm LVIDs:         5.00 cm LV PW:         1.60 cm LV IVS:        1.50 cm LVOT diam:     2.50 cm  3D Volume EF: LV SV:         78       3D EF:        36 % LV SV Index:   25       LV EDV:       322 ml LVOT Area:     4.91 cm LV ESV:       207 ml                         LV SV:        115 ml RIGHT VENTRICLE RV S prime:     6.90 cm/s TAPSE (M-mode): 1.6 cm LEFT ATRIUM              Index       RIGHT ATRIUM           Index LA diam:        5.40 cm  1.75  cm/m  RA Area:     29.30 cm LA Vol (A2C):   145.0 ml 47.07 ml/m RA Volume:   105.00 ml 34.09 ml/m LA Vol (A4C):   151.0 ml 49.02 ml/m LA Biplane Vol: 156.0 ml 50.64 ml/m  AORTIC VALVE LVOT Vmax:   85.10 cm/s LVOT Vmean:  64.933 cm/s LVOT VTI:    0.159 m  AORTA Ao Root diam: 3.80 cm Ao Asc diam:  4.30 cm TRICUSPID VALVE TR Peak grad:   32.9 mmHg TR Vmax:        287.00 cm/s  SHUNTS Systemic VTI:  0.16 m Systemic Diam: 2.50 cm Olga Millers MD Electronically signed by Olga Millers MD Signature Date/Time: 01/27/2021/1:34:19 PM    Final       Subjective: No new complaints.   Discharge Exam: Vitals:   01/31/21 1351 01/31/21 1658  BP: (!) 152/108 (!) 163/97  Pulse: 73 85  Resp: 18   Temp: 98 F (36.7 C)  SpO2: 98% 100%   Vitals:   01/31/21 1300 01/31/21 1330 01/31/21 1351 01/31/21 1658  BP: 126/75 133/86 (!) 152/108 (!) 163/97  Pulse: 70 76 73 85  Resp:   18   Temp:   98 F (36.7 C)   TempSrc:      SpO2:   98% 100%  Weight:      Height:        General: Pt is alert, awake, not in acute distress Cardiovascular: RRR, S1/S2 +, no rubs, no gallops Respiratory: CTA bilaterally, no wheezing, no rhonchi Abdominal: Soft, NT, ND, bowel sounds + Extremities: no edema, no cyanosis    The results of significant diagnostics from this hospitalization (including imaging, microbiology, ancillary and laboratory) are listed below for reference.     Microbiology: Recent Results (from the past 240 hour(s))  Resp Panel by RT-PCR (Flu A&B, Covid) Nasopharyngeal Swab     Status: None   Collection Time: 01/26/21  7:24 PM   Specimen: Nasopharyngeal Swab; Nasopharyngeal(NP) swabs in vial transport medium  Result Value Ref Range Status   SARS Coronavirus 2 by RT PCR NEGATIVE NEGATIVE Final    Comment: (NOTE) SARS-CoV-2 target nucleic acids are NOT DETECTED.  The SARS-CoV-2 RNA is generally detectable in upper respiratory specimens during the acute phase of infection. The  lowest concentration of SARS-CoV-2 viral copies this assay can detect is 138 copies/mL. A negative result does not preclude SARS-Cov-2 infection and should not be used as the sole basis for treatment or other patient management decisions. A negative result may occur with  improper specimen collection/handling, submission of specimen other than nasopharyngeal swab, presence of viral mutation(s) within the areas targeted by this assay, and inadequate number of viral copies(<138 copies/mL). A negative result must be combined with clinical observations, patient history, and epidemiological information. The expected result is Negative.  Fact Sheet for Patients:  BloggerCourse.com  Fact Sheet for Healthcare Providers:  SeriousBroker.it  This test is no t yet approved or cleared by the [place] FDA and  has been authorized for detection and/or diagnosis of SARS-CoV-2 by FDA under an Emergency Use Authorization (EUA). This EUA will remain  in effect (meaning this test can be used) for the duration of the COVID-19 declaration under Section 564(b)(1) of the Act, 21 U.S.C.section 360bbb-3(b)(1), unless the authorization is terminated  or revoked sooner.       Influenza A by PCR NEGATIVE NEGATIVE Final   Influenza B by PCR NEGATIVE NEGATIVE Final    Comment: (NOTE) The Xpert Xpress SARS-CoV-2/FLU/RSV plus assay is intended as an aid in the diagnosis of influenza from Nasopharyngeal swab specimens and should not be used as a sole basis for treatment. Nasal washings and aspirates are unacceptable for Xpert Xpress SARS-CoV-2/FLU/RSV testing.  Fact Sheet for Patients: BloggerCourse.com  Fact Sheet for Healthcare Providers: SeriousBroker.it  This test is not yet approved or cleared by the [place] FDA and has been authorized for detection and/or diagnosis of SARS-CoV-2 by FDA under  an Emergency Use Authorization (EUA). This EUA will remain in effect (meaning this test can be used) for the duration of the COVID-19 declaration under Section 564(b)(1) of the Act, 21 U.S.C. section 360bbb-3(b)(1), unless the authorization is terminated or revoked.  Performed at [facility], [address] [place], [place] [address]      Labs: BNP (last 3 results) Recent Labs    01/26/21 1942  BNP 318.4*   Basic Metabolic Panel: Recent Labs  Lab 01/28/21 0928 01/29/21  0354 01/29/21 1114 01/29/21 1118 01/30/21 0939 01/30/21 1500 01/31/21 0341  NA 138 137 145 143  145 136 137 137  K 3.6 3.3* 3.1* 3.3*  3.0* 5.4* 3.7 3.7  CL 102 103  --   --  100 102 101  CO2 26 25  --   --  24 26 24   GLUCOSE 178* 140*  --   --  206* 188* 155*  BUN 21* 24*  --   --  17 18 18   CREATININE 1.83* 1.85*  --   --  1.80* 1.99* 1.99*  CALCIUM 9.1 8.7*  --   --  9.2 9.2 9.3  MG  --   --   --   --  1.8  --   --    Liver Function Tests: No results for input(s): AST, ALT, ALKPHOS, BILITOT, PROT, ALBUMIN in the last 168 hours. No results for input(s): LIPASE, AMYLASE in the last 168 hours. No results for input(s): AMMONIA in the last 168 hours. CBC: Recent Labs  Lab 01/29/21 1114 01/29/21 1118  HGB 11.2* 11.9*  11.6*  HCT 33.0* 35.0*  34.0*   Cardiac Enzymes: No results for input(s): CKTOTAL, CKMB, CKMBINDEX, TROPONINI in the last 168 hours. BNP: Invalid input(s): POCBNP CBG: Recent Labs  Lab 01/30/21 2027 01/31/21 0524 01/31/21 0902 01/31/21 1142 01/31/21 1648  GLUCAP 184* 148* 173* 234* 218*   D-Dimer No results for input(s): DDIMER in the last 72 hours. Hgb A1c No results for input(s): HGBA1C in the last 72 hours. Lipid Profile No results for input(s): CHOL, HDL, LDLCALC, TRIG, CHOLHDL, LDLDIRECT in the last 72 hours. Thyroid function studies No results for input(s): TSH, T4TOTAL, T3FREE, THYROIDAB in the last 72 hours.  Invalid input(s): FREET3 Anemia work  up No results for input(s): VITAMINB12, FOLATE, FERRITIN, TIBC, IRON, RETICCTPCT in the last 72 hours. Urinalysis    Component Value Date/Time   COLORURINE AMBER (A) 01/27/2021 0415   APPEARANCEUR HAZY (A) 01/27/2021 0415   LABSPEC 1.022 01/27/2021 0415   PHURINE 5.0 01/27/2021 0415   GLUCOSEU NEGATIVE 01/27/2021 0415   HGBUR SMALL (A) 01/27/2021 0415   HGBUR trace-intact 03/19/2008 1441   BILIRUBINUR NEGATIVE 01/27/2021 0415   BILIRUBINUR Negative 05/29/2011 1609   KETONESUR NEGATIVE 01/27/2021 0415   PROTEINUR >=300 (A) 01/27/2021 0415   UROBILINOGEN 0.2 05/29/2011 1609   UROBILINOGEN 1.0 03/12/2010 1140   NITRITE NEGATIVE 01/27/2021 0415   LEUKOCYTESUR NEGATIVE 01/27/2021 0415   Sepsis Labs Invalid input(s): PROCALCITONIN,  WBC,  LACTICIDVEN Microbiology Recent Results (from the past 240 hour(s))  Resp Panel by RT-PCR (Flu A&B, Covid) Nasopharyngeal Swab     Status: None   Collection Time: 01/26/21  7:24 PM   Specimen: Nasopharyngeal Swab; Nasopharyngeal(NP) swabs in vial transport medium  Result Value Ref Range Status   SARS Coronavirus 2 by RT PCR NEGATIVE NEGATIVE Final    Comment: (NOTE) SARS-CoV-2 target nucleic acids are NOT DETECTED.  The SARS-CoV-2 RNA is generally detectable in upper respiratory specimens during the acute phase of infection. The lowest concentration of SARS-CoV-2 viral copies this assay can detect is 138 copies/mL. A negative result does not preclude SARS-Cov-2 infection and should not be used as the sole basis for treatment or other patient management decisions. A negative result may occur with  improper specimen collection/handling, submission of specimen other than nasopharyngeal swab, presence of viral mutation(s) within the areas targeted by this assay, and inadequate number of viral copies(<138 copies/mL). A negative result must be combined with clinical  observations, patient history, and epidemiological information. The expected result  is Negative.  Fact Sheet for Patients:  BloggerCourse.com  Fact Sheet for Healthcare Providers:  SeriousBroker.it  This test is no t yet approved or cleared by the [place] FDA and  has been authorized for detection and/or diagnosis of SARS-CoV-2 by FDA under an Emergency Use Authorization (EUA). This EUA will remain  in effect (meaning this test can be used) for the duration of the COVID-19 declaration under Section 564(b)(1) of the Act, 21 U.S.C.section 360bbb-3(b)(1), unless the authorization is terminated  or revoked sooner.       Influenza A by PCR NEGATIVE NEGATIVE Final   Influenza B by PCR NEGATIVE NEGATIVE Final    Comment: (NOTE) The Xpert Xpress SARS-CoV-2/FLU/RSV plus assay is intended as an aid in the diagnosis of influenza from Nasopharyngeal swab specimens and should not be used as a sole basis for treatment. Nasal washings and aspirates are unacceptable for Xpert Xpress SARS-CoV-2/FLU/RSV testing.  Fact Sheet for Patients: BloggerCourse.com  Fact Sheet for Healthcare Providers: SeriousBroker.it  This test is not yet approved or cleared by the [place] FDA and has been authorized for detection and/or diagnosis of SARS-CoV-2 by FDA under an Emergency Use Authorization (EUA). This EUA will remain in effect (meaning this test can be used) for the duration of the COVID-19 declaration under Section 564(b)(1) of the Act, 21 U.S.C. section 360bbb-3(b)(1), unless the authorization is terminated or revoked.  Performed at [facility], [address], [place], [place] [address]      Time coordinating discharge: 82 MINUTES.   SIGNED:   Kathlen Mody, MD  [facility]

## 2021-02-05 ENCOUNTER — Encounter (HOSPITAL_COMMUNITY): Payer: Self-pay | Admitting: *Deleted

## 2021-02-05 ENCOUNTER — Telehealth (HOSPITAL_COMMUNITY): Payer: Self-pay

## 2021-02-05 NOTE — Telephone Encounter (Signed)
Heart Failure Nurse Navigator Progress Note  Return phone call. Pt requesting to return to work as he works from home, states job is not stressful at this time of year.  Relayed message with team. Permission from Dr. Gala Romney to return to work. Emailed letter to patient ([email or web address]) per pt request. Reminded pt of AHF appt 10/18 @ 11AM. Pt confirmed appt, has transportation.  Ozella Rocks, MSN, RN Heart Failure Nurse Navigator [phone]

## 2021-02-05 NOTE — Telephone Encounter (Signed)
Heart Failure Nurse Navigator Progress Note  Received message from 3E nursing staff regarding patient calling attempting to reach AHF staff with questions about return/release to work.  Navigator attempted to call pt number listed, left vm with callback number to address issues. Will work with providers to determine next steps with patient.   Pt has AHF appt 10/18.   Ozella Rocks, MSN, RN Heart Failure Nurse Navigator [phone]

## 2021-02-07 ENCOUNTER — Encounter (HOSPITAL_COMMUNITY): Payer: 59

## 2021-02-10 NOTE — Progress Notes (Addendum)
PCP: [facility] Primary Cardiologist:DrBensimhon  HPI: [name] is a 50 year old with a history of HFrEF, permanent A fib, HTN, hypertensive retinopathy, CKD Stage IIIa, hyperlipidemia, DMII, Bells palsy, CVA, and OSA.    Admitted in 12/02/2019 with newly diagnosed A fib + ischemic CVA. He did not meet criteria for IV tPA. MRI showed large acute focal infarct, left frontal lobe predominantly ACA distribution. CT angiogram of the head and neck performed revealing no significant carotid, vertebral or intracranial stenosis. Echocardiogram obtained with bubble study showing no cardiac etiology for stroke. LVEF was 40 to 45%.Placed on Toprol XL. Creatinine on 12/04/20 1.7.    Followed at [facility] for cardiology services. Saw Cyndia Diver on 12/12/2019. Set up for sleep study. Using CPAP 3 times a week.   Presented to [facility] with A/C HFrEF and A fib RVR.  Echo completed. EF down to 30-35% with speckled appears concerning for amyloid.Diuresed with IV lasix and placed on Amio drip. Once diuresed had TEE-DC-CV with restoration of NSR. Had RHC/LHC with min obs CAD, elevated filling pressures and normal cardiac output.  Diuresed 40 pounds. Discharge weight 367 pound  Today he returns for HF follow up.Overall feeling so much better. Says he feels "Brand new!". Denies SOB/PND/Orthopnea. No bleeding issues. Appetite ok. No fever or chills. Weight at home 369-370 pounds. Using CPAP 3 days a week. Taking all medications. Wants to go back to work.   Cardiac Testing Echo 01/27/21 EF 30-35% specked appearance concerning for amyloid. RV normal. LA severely dilated. Severe LVH.  Echo 11/2019 40-45% and severe LVH   RHC/LHC 01/29/21  Ao = 163/108 (134) LV = 136/25 RA =  15 RV = 51/13 PA = 57/19 (39) PCW = 28 Fick cardiac output/index = 9.2/3.0 PVR = 1.2 WU Ao sat = 94% PA sat = 65%, 65%  Assessment:  1. Mild non-obstructive CAD 2. NICM (likely hypertensive) with EF 35% by echo 3. Elevated filling  pressures with normal CO 4. Severe systemic HTN ROS: All systems negative except as listed in HPI, PMH and Problem List.  SH:  Social History   Socioeconomic History   Marital status: Married    Spouse name: Nambe Antolin   Number of children: 3   Years of education: Not on file   Highest education level: Bachelor's degree (e.g., BA, AB, BS)  Occupational History   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    Comment: [place]  Tobacco Use   Smoking status: Never   Smokeless tobacco: Never   Tobacco comments:    monthly, when going out  Vaping Use   Vaping Use: Never used  Substance and Sexual Activity   Alcohol use: Yes    Comment: Occasional   Drug use: No   Sexual activity: Not on file  Other Topics Concern   Not on file  Social History Narrative   Newly married, 2012, 1 daughter, 1 son   No injectable steroid cycles   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, 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)   Regular exercise-yes   Social Determinants of Health   Financial Resource Strain: Low Risk    Difficulty of Paying Living Expenses: Not hard at all  Food Insecurity: No Food Insecurity   Worried About Programme researcher, broadcasting/film/video in the Last Year: Never true   Ran Out of Food in the Last Year: Never true  Transportation Needs: No Transportation Needs   Lack of Transportation (Medical): No   Lack of Transportation (Non-Medical): No  Physical Activity: Not on file  Stress: Not on file  Social Connections: Not on file  Intimate Partner Violence: Not on file    FH:  Family History  Problem Relation Age of Onset   Hypertension Mother    Hypertension Father    Diabetes Father    Hypertension Brother    Diabetes Sister    Coronary artery disease Neg Hx    Stroke Neg Hx    Cancer Neg Hx    Kidney disease Paternal Grandmother        ESRD    Past Medical History:  Diagnosis Date   CKD (chronic kidney disease) stage 3, GFR 30-59 ml/min (HCC)    baseline Cr 1.5-1.7    Decreased visual acuity    Left eye, resolved - hypertensive retinopathy   Diabetes mellitus 2012   dx hospitalization with A1c 6.6%   Hilar adenopathy    on CT scan 02/2010, on rpt scan stable/improved.   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   Systolic murmur    Vitamin D deficiency     Current Outpatient Medications  Medication Sig Dispense Refill   amiodarone (PACERONE) 200 MG tablet Take 1 tablet (200 mg total) by mouth daily. 30 tablet 1   apixaban (ELIQUIS) 5 MG TABS tablet Take 1 tablet (5 mg total) by mouth 2 (two) times daily. 60 tablet 11   atorvastatin (LIPITOR) 20 MG tablet Take 20 mg by mouth at bedtime.     empagliflozin (JARDIANCE) 10 MG TABS tablet Take 1 tablet (10 mg total) by mouth daily. 30 tablet 3   famotidine (PEPCID) 20 MG tablet Take 1 tablet (20 mg total) by mouth 2 (two) times daily. 60 tablet 0   furosemide (LASIX) 80 MG tablet Take 1 tablet (80 mg total) by mouth daily. 30 tablet 2   glimepiride (AMARYL) 4 MG tablet Take 4 mg by mouth daily with breakfast.     isosorbide-hydrALAZINE (BIDIL) 20-37.5 MG tablet Take 1 tablet by mouth 3 (three) times daily. 90 tablet 2   latanoprost (XALATAN) 0.005 % ophthalmic solution Place 1 drop into both eyes at bedtime.     metoprolol succinate (TOPROL-XL) 100 MG 24 hr tablet Take 1 tablet (100 mg total) by mouth daily. Take with or immediately following a meal. 30 tablet 2   sacubitril-valsartan (ENTRESTO) 97-103 MG Take 1 tablet by mouth 2 (two) times daily. 60 tablet 2   spironolactone (ALDACTONE) 25 MG tablet Take 1 tablet (25 mg total) by mouth daily. 30 tablet 2   Vitamin D, Ergocalciferol, (DRISDOL) 1.25 MG (50000 UNIT) CAPS  capsule Take 1 capsule by mouth every Monday.     No current facility-administered medications for this encounter.    Vitals:   02/11/21 1104  BP: (!) 160/80  Pulse: 71  SpO2: 98%  Weight: (!) 168.9 kg   Wt Readings from Last 3 Encounters:  02/11/21 (!) 168.9 kg  01/31/21 (!) 171.1 kg  08/07/20 (!) 180.5 kg    PHYSICAL EXAM: General:  Well appearing. No resp difficulty. Walked in the clinic.  HEENT: normal Neck: supple. JVP flat. Carotids 2+ bilaterally;  no bruits. No lymphadenopathy or thryomegaly appreciated. Cor: PMI normal. Regular rate & rhythm. No rubs, gallops or murmurs. Lungs: clear Abdomen: soft, nontender, nondistended. No hepatosplenomegaly. No bruits or masses. Good bowel sounds. Extremities: no cyanosis, clubbing, rash, edema Neuro: alert & orientedx3, cranial nerves grossly intact. Moves all 4 extremities w/o difficulty. Affect pleasant.   ECG: NSR 74 bpm    ASSESSMENT & PLAN: 1. Chronic HFrEF - NICM. LHC w/ mild nonobstructive CAD. Suspect HTN cardiomyopathy.  ECHO 01/31/21 EF 30-35% with severe LVH. Doubt amyloid with uncontrolled hypertension. - NYHA I. Volume status stable. Continue lasix 80 mg daily.  -Continue  Toprol XL 100 mg daily - Increase  Bidil 2 tablets  tid - Continue Spiro 25 mg daily.  - Continue  Entresto 97-103 mg twice a day.    -Continue Jardiance 10 mg daily.  - Check BMET today.  - Plan to repeat ECHO in 3 months after HF meds optimized.    2. Hypertension   -- encouraged full compliance with CPAP - As above increase bidil to 2 tabs three times a day.    3. PAF   - In SR today. - 01/31/21 S/P TEE/DC-CV  with restoration NSR  - Continue  Toprol XL to 100 mg daily  - Continue Eliquis 5 mg bid  - Check BMET    4. CKD Stage IIIb  - Creatinine baseline 1.7-1.9 - Followed by twice a year by Nephrology in [place].  - Check BMET    5 DMII -Hgb A1C 6.3 - needs follow up with PCP - on Jardiance 10    6. OSA -Using CPAP  3 times a week.  -Discussed and needs to use CPAP nightly.    7. Anemia, iron-def  -Iron Sats 6% given IV Iron  01/2021.    8. CAD - mild nonobstructive on cath -  c/w statin, LDL Goal < 70  - on ? blocker - no ASA w/ Eliquis   Doing great! Follow up in 3-4 weeks with pharmacy 8 weeks with APP and 3 months with Dr Gala Romney and an ECHO. Discussed medications changes and plan for the next 3 months. Ok to return to work.     Amariss Detamore np-c  5:09 PM

## 2021-02-11 ENCOUNTER — Encounter (HOSPITAL_COMMUNITY): Payer: Self-pay

## 2021-02-11 ENCOUNTER — Other Ambulatory Visit: Payer: Self-pay

## 2021-02-11 ENCOUNTER — Ambulatory Visit (HOSPITAL_COMMUNITY)
Admission: RE | Admit: 2021-02-11 | Discharge: 2021-02-11 | Disposition: A | Discharge status: Home / Self Care | Payer: 59 | Source: Ambulatory Visit | Attending: Adult Health | Admitting: Adult Health

## 2021-02-11 ENCOUNTER — Other Ambulatory Visit (HOSPITAL_COMMUNITY): Payer: Self-pay

## 2021-02-11 VITALS — BP 160/80 | HR 71 | Wt 372.4 lb

## 2021-02-11 DIAGNOSIS — Z7901 Long term (current) use of anticoagulants: Secondary | ICD-10-CM | POA: Insufficient documentation

## 2021-02-11 DIAGNOSIS — E785 Hyperlipidemia, unspecified: Secondary | ICD-10-CM | POA: Insufficient documentation

## 2021-02-11 DIAGNOSIS — Z833 Family history of diabetes mellitus: Secondary | ICD-10-CM | POA: Diagnosis not present

## 2021-02-11 DIAGNOSIS — I130 Hypertensive heart and chronic kidney disease with heart failure and stage 1 through stage 4 chronic kidney disease, or unspecified chronic kidney disease: Secondary | ICD-10-CM | POA: Diagnosis not present

## 2021-02-11 DIAGNOSIS — E1122 Type 2 diabetes mellitus with diabetic chronic kidney disease: Secondary | ICD-10-CM | POA: Insufficient documentation

## 2021-02-11 DIAGNOSIS — Z8673 Personal history of transient ischemic attack (TIA), and cerebral infarction without residual deficits: Secondary | ICD-10-CM | POA: Diagnosis not present

## 2021-02-11 DIAGNOSIS — Z79899 Other long term (current) drug therapy: Secondary | ICD-10-CM | POA: Diagnosis not present

## 2021-02-11 DIAGNOSIS — Z8249 Family history of ischemic heart disease and other diseases of the circulatory system: Secondary | ICD-10-CM | POA: Diagnosis not present

## 2021-02-11 DIAGNOSIS — G4733 Obstructive sleep apnea (adult) (pediatric): Secondary | ICD-10-CM | POA: Insufficient documentation

## 2021-02-11 DIAGNOSIS — I428 Other cardiomyopathies: Secondary | ICD-10-CM | POA: Diagnosis not present

## 2021-02-11 DIAGNOSIS — D509 Iron deficiency anemia, unspecified: Secondary | ICD-10-CM | POA: Insufficient documentation

## 2021-02-11 DIAGNOSIS — I158 Other secondary hypertension: Secondary | ICD-10-CM | POA: Diagnosis not present

## 2021-02-11 DIAGNOSIS — I5022 Chronic systolic (congestive) heart failure: Secondary | ICD-10-CM | POA: Diagnosis not present

## 2021-02-11 DIAGNOSIS — Z7984 Long term (current) use of oral hypoglycemic drugs: Secondary | ICD-10-CM | POA: Diagnosis not present

## 2021-02-11 DIAGNOSIS — I2510 Atherosclerotic heart disease of native coronary artery without angina pectoris: Secondary | ICD-10-CM | POA: Diagnosis not present

## 2021-02-11 DIAGNOSIS — I480 Paroxysmal atrial fibrillation: Secondary | ICD-10-CM | POA: Insufficient documentation

## 2021-02-11 DIAGNOSIS — Z9989 Dependence on other enabling machines and devices: Secondary | ICD-10-CM

## 2021-02-11 DIAGNOSIS — I5032 Chronic diastolic (congestive) heart failure: Secondary | ICD-10-CM | POA: Diagnosis not present

## 2021-02-11 DIAGNOSIS — N1832 Chronic kidney disease, stage 3b: Secondary | ICD-10-CM | POA: Insufficient documentation

## 2021-02-11 LAB — BASIC METABOLIC PANEL
Anion gap: 8 (ref 5–15)
BUN: 25 mg/dL — ABNORMAL HIGH (ref 6–20)
CO2: 24 mmol/L (ref 22–32)
Calcium: 9.1 mg/dL (ref 8.9–10.3)
Chloride: 103 mmol/L (ref 98–111)
Creatinine, Ser: 2.23 mg/dL — ABNORMAL HIGH (ref 0.61–1.24)
GFR, Estimated: 35 mL/min — ABNORMAL LOW (ref >60)
Glucose, Bld: 200 mg/dL — ABNORMAL HIGH (ref 70–99)
Potassium: 4.1 mmol/L (ref 3.5–5.1)
Sodium: 135 mmol/L (ref 135–145)

## 2021-02-11 LAB — CBC
HCT: 45.7 % (ref 39.0–52.0)
Hemoglobin: 14 g/dL (ref 13.0–17.0)
MCH: 24.7 pg — ABNORMAL LOW (ref 26.0–34.0)
MCHC: 30.6 g/dL (ref 30.0–36.0)
MCV: 80.7 fL (ref 80.0–100.0)
Platelets: 397 10*3/uL (ref 150–400)
RBC: 5.66 MIL/uL (ref 4.22–5.81)
RDW: 16 % — ABNORMAL HIGH (ref 11.5–15.5)
WBC: 12.8 10*3/uL — ABNORMAL HIGH (ref 4.0–10.5)
nRBC: 0 % (ref 0.0–0.2)

## 2021-02-11 MED ORDER — ISOSORB DINITRATE-HYDRALAZINE 20-37.5 MG PO TABS
2.0000 | ORAL_TABLET | Freq: Three times a day (TID) | ORAL | 2 refills | Status: DC
  Filled 2021-02-11: qty 180, 30d supply, fill #0

## 2021-02-11 NOTE — Patient Instructions (Signed)
EKG was done today   Labs were done if any labs are abnormal the clinic will call you  INCREASE Bidil to 2 tablets 3 times daily  Your physician recommends that you schedule a follow-up appointment in: 4 weeks and in 3 months with echocardiogram  At the [facility], you and your health needs are our priority. As part of our continuing mission to provide you with exceptional heart care, we have created designated Provider Care Teams. These Care Teams include your primary Cardiologist (physician) and Advanced Practice Providers (APPs- Physician Assistants and Nurse Practitioners) who all work together to provide you with the care you need, when you need it.   You may see any of the following providers on your designated Care Team at your next follow up: Dr Arvilla Meres Dr Marca Ancona Dr Brandon Melnick, NP Robbie Lis, [place] Mikki Santee Karle Plumber, PharmD   Please be sure to bring in all your medications bottles to every appointment.   If you have any questions or concerns before your next appointment please send [place] a message through [place] or call our office at [phone].    TO LEAVE A MESSAGE FOR THE NURSE SELECT OPTION 2, PLEASE LEAVE A MESSAGE INCLUDING: YOUR NAME DATE OF BIRTH CALL BACK NUMBER REASON FOR CALL**this is important as we prioritize the call backs  YOU WILL RECEIVE A CALL BACK THE SAME DAY AS LONG AS YOU CALL BEFORE 4:00 PM

## 2021-02-12 ENCOUNTER — Other Ambulatory Visit (HOSPITAL_COMMUNITY): Payer: Self-pay

## 2021-02-12 ENCOUNTER — Telehealth (HOSPITAL_COMMUNITY): Payer: Self-pay

## 2021-02-12 MED ORDER — FUROSEMIDE 80 MG PO TABS: 40.0000 mg | ORAL_TABLET | Freq: Every day | ORAL | 2 refills | Status: DC

## 2021-02-12 NOTE — Telephone Encounter (Signed)
-----   Message from Sherald Hess, NP sent at 02/11/2021  5:39 PM EDT ----- Renal function trending up. Please call. Hold lasix x 24 hours then cut back lasix to 40 mg po daily.

## 2021-02-12 NOTE — Telephone Encounter (Signed)
Patient advised and verbalized understanding. Med list updated to reflect changes.   Meds ordered this encounter  Medications   furosemide (LASIX) 80 MG tablet    Sig: Take 0.5 tablets (40 mg total) by mouth daily.    Dispense:  15 tablet    Refill:  2    Please cancel all previous orders for current medication. Change in dosage or pill size.

## 2021-02-20 ENCOUNTER — Other Ambulatory Visit (HOSPITAL_COMMUNITY): Payer: Self-pay

## 2021-03-13 NOTE — Progress Notes (Incomplete)
PCP: [facility] Primary Cardiologist:DrBensimhon  HPI: Randy [name] is a 50 year old with a history of HFrEF, permanent A fib, HTN, hypertensive retinopathy, CKD Stage IIIa, hyperlipidemia, DMII, Bells palsy, CVA, and OSA.    Admitted in 12/02/2019 with newly diagnosed A fib + ischemic CVA. He did not meet criteria for IV tPA. MRI showed large acute focal infarct, left frontal lobe predominantly ACA distribution. CT angiogram of the head and neck performed revealing no significant carotid, vertebral or intracranial stenosis. Echocardiogram obtained with bubble study showing no cardiac etiology for stroke. LVEF was 40 to 45%.Placed on Toprol XL. Creatinine on 12/04/20 1.7.    Followed at [facility] for cardiology services. Saw Randy [name] on 12/12/2019. Set up for sleep study. Using CPAP 3 times a week.   Presented to [facility] with A/C HFrEF and A fib RVR.  Echo completed. EF down to 30-35% with speckled appears concerning for amyloid.Diuresed with IV lasix and placed on Amio drip. Once diuresed had TEE-DC-CV with restoration of NSR. Had RHC/LHC with min obs CAD, elevated filling pressures and normal cardiac output.  Diuresed 40 pounds. Discharge weight 367 pound  Today he returns for HF follow up.Overall feeling so much better. Says he feels "Brand new!". Denies SOB/PND/Orthopnea. No bleeding issues. Appetite ok. No fever or chills. Weight at home 369-370 pounds. Using CPAP 3 days a week. Taking all medications. Wants to go back to work.   Cardiac Testing Echo 01/27/21 EF 30-35% specked appearance concerning for amyloid. RV normal. LA severely dilated. Severe LVH.  Echo 11/2019 40-45% and severe LVH   RHC/LHC 01/29/21  Ao = 163/108 (134) LV = 136/25 RA =  15 RV = 51/13 PA = 57/19 (39) PCW = 28 Fick cardiac output/index = 9.2/3.0 PVR = 1.2 WU Ao sat = 94% PA sat = 65%, 65%  Assessment:  1. Mild non-obstructive CAD 2. NICM (likely hypertensive) with EF 35% by echo 3. Elevated filling  pressures with normal CO 4. Severe systemic HTN ROS: All systems negative except as listed in HPI, PMH and Problem List.  SH:  Social History   Socioeconomic History   Marital status: Married    Spouse name: Randy [name]   Number of children: 3   Years of education: Not on file   Highest education level: Bachelor's degree (e.g., BA, AB, BS)  Occupational History   Occupation: Tax Forensic psychologist    Comment: [place]  Tobacco Use   Smoking status: Never   Smokeless tobacco: Never   Tobacco comments:    monthly, when going out  Vaping Use   Vaping Use: Never used  Substance and Sexual Activity   Alcohol use: Yes    Comment: Occasional   Drug use: No   Sexual activity: Not on file  Other Topics Concern   Not on file  Social History Narrative   Newly married, 2012, 1 daughter, 1 son   No injectable steroid cycles   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, 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)   Regular exercise-yes   Social Determinants of Health   Financial Resource Strain: Low Risk    Difficulty of Paying Living Expenses: Not hard at all  Food Insecurity: No Food Insecurity   Worried About Charity fundraiser in the Last Year: Never true   Ran Out of Food in the Last Year: Never true  Transportation Needs: No Transportation Needs   Lack of Transportation (Medical): No   Lack of Transportation (Non-Medical): No  Physical Activity: Not on file  Stress: Not on file  Social Connections: Not on file  Intimate Partner Violence: Not on file    FH:  Family History  Problem Relation Age of Onset   Hypertension Mother    Hypertension Father    Diabetes Father    Hypertension Brother    Diabetes Sister    Coronary artery disease Neg Hx    Stroke Neg Hx    Cancer Neg Hx    Kidney disease Paternal Grandmother        ESRD    Past Medical History:  Diagnosis Date   CKD (chronic kidney disease) stage 3, GFR 30-59 ml/min (HCC)    baseline Cr 1.5-1.7    Decreased visual acuity    Left eye, resolved - hypertensive retinopathy   Diabetes mellitus 2012   dx hospitalization with A1c 6.6%   Hilar adenopathy    on CT scan 02/2010, on rpt scan stable/improved.   History of Bell's palsy 9/09   history, Left   History of headache    HTN (hypertension), malignant    previously on BC and goody powders for HA   Internal derangement of knee 9/09   Left   Microalbuminuria    Morbid obesity ([name])    Stroke ([place])    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[place])    echo 2011 with nonischemic hypertensive cardiomyopathy   Systolic murmur    Vitamin D deficiency     Current Outpatient Medications  Medication Sig Dispense Refill   amiodarone (PACERONE) 200 MG tablet Take 1 tablet (200 mg total) by mouth daily. 30 tablet 1   apixaban (ELIQUIS) 5 MG TABS tablet Take 1 tablet (5 mg total) by mouth 2 (two) times daily. 60 tablet 11   atorvastatin (LIPITOR) 20 MG tablet Take 20 mg by mouth at bedtime.     empagliflozin (JARDIANCE) 10 MG TABS tablet Take 1 tablet (10 mg total) by mouth daily. 30 tablet 3   famotidine (PEPCID) 20 MG tablet Take 1 tablet (20 mg total) by mouth 2 (two) times daily. 60 tablet 0   furosemide (LASIX) 80 MG tablet Take 0.5 tablets (40 mg total) by mouth daily. 15 tablet 2   glimepiride (AMARYL) 4 MG tablet Take 4 mg by mouth daily with breakfast.     isosorbide-hydrALAZINE (BIDIL) 20-37.5 MG tablet Take 2 tablets by mouth 3 (three) times daily. 180 tablet 2   latanoprost (XALATAN) 0.005 % ophthalmic solution Place 1 drop into both eyes at bedtime.     metoprolol succinate (TOPROL-XL) 100 MG 24 hr tablet Take 1 tablet (100 mg total) by mouth daily. Take with or immediately following a meal. 30 tablet 2   sacubitril-valsartan (ENTRESTO) 97-103 MG Take 1 tablet by mouth 2 (two) times daily. 60 tablet 2   spironolactone (ALDACTONE) 25 MG tablet Take 1 tablet (25 mg total) by mouth daily. 30 tablet 2   Vitamin D, Ergocalciferol, (DRISDOL) 1.25 MG (50000 UNIT)  CAPS capsule Take 1 capsule by mouth every Monday.     No current facility-administered medications for this visit.    There were no vitals filed for this visit.  Wt Readings from Last 3 Encounters:  02/11/21 (!) 168.9 kg (372 lb 6.4 oz)  01/31/21 (!) 171.1 kg (377 lb 3.3 oz)  08/07/20 (!) 180.5 kg (398 lb)    PHYSICAL EXAM: General:  Well appearing. No resp difficulty. Walked in the clinic.  HEENT: normal Neck: supple. JVP flat. Carotids 2+ bilaterally; no bruits. No  lymphadenopathy or thryomegaly appreciated. Cor: PMI normal. Regular rate & rhythm. No rubs, gallops or murmurs. Lungs: clear Abdomen: soft, nontender, nondistended. No hepatosplenomegaly. No bruits or masses. Good bowel sounds. Extremities: no cyanosis, clubbing, rash, edema Neuro: alert & orientedx3, cranial nerves grossly intact. Moves all 4 extremities w/o difficulty. Affect pleasant.   ECG: NSR 74 bpm    ASSESSMENT & PLAN: 1. Chronic HFrEF - NICM. LHC w/ mild nonobstructive CAD. Suspect HTN cardiomyopathy.  ECHO 01/31/21 EF 30-35% with severe LVH. Doubt amyloid with uncontrolled hypertension. - NYHA I. Volume status stable. Continue lasix 80 mg daily.  -Continue  Toprol XL 100 mg daily - Increase  Bidil 2 tablets  tid - Continue Spiro 25 mg daily.  - Continue  Entresto 97-103 mg twice a day.    -Continue Jardiance 10 mg daily.  - Check BMET today.  - Plan to repeat ECHO in 3 months after HF meds optimized.    2. Hypertension   -- encouraged full compliance with CPAP - As above increase bidil to 2 tabs three times a day.    3. PAF   - In SR today. - 01/31/21 S/P TEE/DC-CV  with restoration NSR  - Continue  Toprol XL to 100 mg daily  - Continue Eliquis 5 mg bid  - Check BMET    4. CKD Stage IIIb  - Creatinine baseline 1.7-1.9 - Followed by twice a year by Nephrology in [place].  - Check BMET    5 DMII -Hgb A1C 6.3 - needs follow up with PCP - on Jardiance 10    6. OSA -Using CPAP 3  times a week.  -Discussed and needs to use CPAP nightly.    7. Anemia, iron-def  -Iron Sats 6% given IV Iron  01/2021.    8. CAD - mild nonobstructive on cath -  c/w statin, LDL Goal < 70  - on ? blocker - no ASA w/ Eliquis   Doing great! Follow up in 3-4 weeks with pharmacy 8 weeks with APP and 3 months with Dr Gala Romney and an ECHO. Discussed medications changes and plan for the next 3 months. Ok to return to work.     Anderson [place] Bienvenido Proehl np-c  1:06 PM

## 2021-03-14 ENCOUNTER — Encounter (HOSPITAL_COMMUNITY): Payer: 59

## 2021-04-16 ENCOUNTER — Other Ambulatory Visit (HOSPITAL_COMMUNITY): Payer: Self-pay

## 2021-04-16 ENCOUNTER — Other Ambulatory Visit (HOSPITAL_COMMUNITY): Payer: Self-pay | Admitting: Adult Health

## 2021-04-16 MED ORDER — APIXABAN 5 MG PO TABS: 5.0000 mg | ORAL_TABLET | Freq: Two times a day (BID) | ORAL | 1 refills | Status: DC

## 2021-04-16 MED ORDER — SPIRONOLACTONE 25 MG PO TABS: 25.0000 mg | ORAL_TABLET | Freq: Every day | ORAL | 1 refills | Status: DC

## 2021-04-16 MED ORDER — EMPAGLIFLOZIN 10 MG PO TABS: 10.0000 mg | ORAL_TABLET | Freq: Every day | ORAL | 1 refills | Status: DC

## 2021-04-16 MED ORDER — FUROSEMIDE 80 MG PO TABS: 40.0000 mg | ORAL_TABLET | Freq: Every day | ORAL | 1 refills | Status: DC

## 2021-04-16 MED ORDER — ISOSORB DINITRATE-HYDRALAZINE 20-37.5 MG PO TABS
2.0000 | ORAL_TABLET | Freq: Three times a day (TID) | ORAL | 1 refills | Status: DC
Start: 2021-04-16 — End: 2021-06-02

## 2021-04-16 MED ORDER — AMIODARONE HCL 200 MG PO TABS: 200.0000 mg | ORAL_TABLET | Freq: Every day | ORAL | 1 refills | Status: DC

## 2021-04-16 MED ORDER — METOPROLOL SUCCINATE ER 100 MG PO TB24: 100.0000 mg | ORAL_TABLET | Freq: Every day | ORAL | 1 refills | Status: DC

## 2021-04-16 MED ORDER — ATORVASTATIN CALCIUM 20 MG PO TABS
20.0000 mg | ORAL_TABLET | Freq: Every day | ORAL | 1 refills | Status: DC
Start: 2021-04-16 — End: 2022-01-14

## 2021-04-16 MED ORDER — SACUBITRIL-VALSARTAN 97-103 MG PO TABS: 1.0000 | ORAL_TABLET | Freq: Two times a day (BID) | ORAL | 1 refills | Status: DC

## 2021-05-21 ENCOUNTER — Encounter (HOSPITAL_COMMUNITY): Payer: 59 | Admitting: Internal Medicine

## 2021-05-21 ENCOUNTER — Ambulatory Visit (HOSPITAL_COMMUNITY): Payer: 59

## 2021-06-02 ENCOUNTER — Other Ambulatory Visit (HOSPITAL_COMMUNITY): Payer: Self-pay

## 2021-06-02 ENCOUNTER — Telehealth (HOSPITAL_COMMUNITY): Payer: Self-pay | Admitting: *Deleted

## 2021-06-02 ENCOUNTER — Other Ambulatory Visit (HOSPITAL_COMMUNITY): Payer: Self-pay | Admitting: *Deleted

## 2021-06-02 MED ORDER — ISOSORB DINITRATE-HYDRALAZINE 20-37.5 MG PO TABS: 2.0000 | ORAL_TABLET | Freq: Three times a day (TID) | ORAL | 3 refills | Status: DC

## 2021-06-02 NOTE — Telephone Encounter (Signed)
Pt left vm requesting return call about meds. I called pt back no answer/left vm for return call.

## 2021-06-03 ENCOUNTER — Other Ambulatory Visit (HOSPITAL_COMMUNITY): Payer: Self-pay | Admitting: *Deleted

## 2021-06-03 MED ORDER — ISOSORB DINITRATE-HYDRALAZINE 20-37.5 MG PO TABS: 2.0000 | ORAL_TABLET | Freq: Three times a day (TID) | ORAL | 3 refills | Status: AC

## 2021-07-02 ENCOUNTER — Other Ambulatory Visit: Payer: Self-pay

## 2021-07-02 ENCOUNTER — Ambulatory Visit (HOSPITAL_COMMUNITY)
Admission: RE | Admit: 2021-07-02 | Discharge: 2021-07-02 | Disposition: A | Discharge status: Home / Self Care | Payer: 59 | Source: Ambulatory Visit | Attending: Internal Medicine | Admitting: Internal Medicine

## 2021-07-02 ENCOUNTER — Other Ambulatory Visit (HOSPITAL_COMMUNITY): Payer: Self-pay | Admitting: *Deleted

## 2021-07-02 ENCOUNTER — Ambulatory Visit (HOSPITAL_COMMUNITY)
Admission: RE | Admit: 2021-07-02 | Discharge: 2021-07-02 | Disposition: A | Discharge status: Home / Self Care | Payer: 59 | Source: Ambulatory Visit | Attending: Adult Health | Admitting: Adult Health

## 2021-07-02 ENCOUNTER — Encounter (HOSPITAL_COMMUNITY): Payer: Self-pay | Admitting: Internal Medicine

## 2021-07-02 VITALS — BP 130/80 | HR 94 | Wt 398.4 lb

## 2021-07-02 DIAGNOSIS — E1122 Type 2 diabetes mellitus with diabetic chronic kidney disease: Secondary | ICD-10-CM | POA: Insufficient documentation

## 2021-07-02 DIAGNOSIS — I2510 Atherosclerotic heart disease of native coronary artery without angina pectoris: Secondary | ICD-10-CM | POA: Insufficient documentation

## 2021-07-02 DIAGNOSIS — Z7984 Long term (current) use of oral hypoglycemic drugs: Secondary | ICD-10-CM | POA: Insufficient documentation

## 2021-07-02 DIAGNOSIS — G4733 Obstructive sleep apnea (adult) (pediatric): Secondary | ICD-10-CM | POA: Diagnosis not present

## 2021-07-02 DIAGNOSIS — I480 Paroxysmal atrial fibrillation: Secondary | ICD-10-CM

## 2021-07-02 DIAGNOSIS — I5022 Chronic systolic (congestive) heart failure: Secondary | ICD-10-CM

## 2021-07-02 DIAGNOSIS — Z7901 Long term (current) use of anticoagulants: Secondary | ICD-10-CM | POA: Diagnosis not present

## 2021-07-02 DIAGNOSIS — Z79899 Other long term (current) drug therapy: Secondary | ICD-10-CM | POA: Diagnosis not present

## 2021-07-02 DIAGNOSIS — I428 Other cardiomyopathies: Secondary | ICD-10-CM | POA: Insufficient documentation

## 2021-07-02 DIAGNOSIS — I5023 Acute on chronic systolic (congestive) heart failure: Secondary | ICD-10-CM

## 2021-07-02 DIAGNOSIS — Z823 Family history of stroke: Secondary | ICD-10-CM | POA: Insufficient documentation

## 2021-07-02 DIAGNOSIS — Z8673 Personal history of transient ischemic attack (TIA), and cerebral infarction without residual deficits: Secondary | ICD-10-CM | POA: Diagnosis not present

## 2021-07-02 DIAGNOSIS — I130 Hypertensive heart and chronic kidney disease with heart failure and stage 1 through stage 4 chronic kidney disease, or unspecified chronic kidney disease: Secondary | ICD-10-CM | POA: Diagnosis not present

## 2021-07-02 DIAGNOSIS — N1831 Chronic kidney disease, stage 3a: Secondary | ICD-10-CM | POA: Insufficient documentation

## 2021-07-02 DIAGNOSIS — I158 Other secondary hypertension: Secondary | ICD-10-CM

## 2021-07-02 DIAGNOSIS — I5032 Chronic diastolic (congestive) heart failure: Secondary | ICD-10-CM | POA: Diagnosis not present

## 2021-07-02 LAB — CBC
HCT: 45.4 % (ref 39.0–52.0)
Hemoglobin: 14.4 g/dL (ref 13.0–17.0)
MCH: 26 pg (ref 26.0–34.0)
MCHC: 31.7 g/dL (ref 30.0–36.0)
MCV: 81.9 fL (ref 80.0–100.0)
Platelets: 300 10*3/uL (ref 150–400)
RBC: 5.54 MIL/uL (ref 4.22–5.81)
RDW: 15.8 % — ABNORMAL HIGH (ref 11.5–15.5)
WBC: 9.4 10*3/uL (ref 4.0–10.5)
nRBC: 0 % (ref 0.0–0.2)

## 2021-07-02 LAB — ECHOCARDIOGRAM COMPLETE
AR max vel: 3.15 cm2
AV Peak grad: 6.3 mmHg
Ao pk vel: 1.25 m/s
Area-P 1/2: 3.85 cm2
Calc EF: 52 %
S' Lateral: 4.3 cm
Single Plane A2C EF: 50.7 %
Single Plane A4C EF: 53.8 %

## 2021-07-02 LAB — COMPREHENSIVE METABOLIC PANEL
ALT: 42 U/L (ref 0–44)
AST: 35 U/L (ref 15–41)
Albumin: 3.6 g/dL (ref 3.5–5.0)
Alkaline Phosphatase: 56 U/L (ref 38–126)
Anion gap: 10 (ref 5–15)
BUN: 18 mg/dL (ref 6–20)
CO2: 23 mmol/L (ref 22–32)
Calcium: 9 mg/dL (ref 8.9–10.3)
Chloride: 107 mmol/L (ref 98–111)
Creatinine, Ser: 2.04 mg/dL — ABNORMAL HIGH (ref 0.61–1.24)
GFR, Estimated: 39 mL/min — ABNORMAL LOW (ref >60)
Glucose, Bld: 140 mg/dL — ABNORMAL HIGH (ref 70–99)
Potassium: 4.5 mmol/L (ref 3.5–5.1)
Sodium: 140 mmol/L (ref 135–145)
Total Bilirubin: 0.7 mg/dL (ref 0.3–1.2)
Total Protein: 7 g/dL (ref 6.5–8.1)

## 2021-07-02 LAB — BRAIN NATRIURETIC PEPTIDE: B Natriuretic Peptide: 625.9 pg/mL — ABNORMAL HIGH (ref 0.0–100.0)

## 2021-07-02 MED ORDER — POTASSIUM CHLORIDE CRYS ER 20 MEQ PO TBCR: EXTENDED_RELEASE_TABLET | ORAL | 0 refills | Status: DC

## 2021-07-02 MED ORDER — FUROSEMIDE 80 MG PO TABS: 80.0000 mg | ORAL_TABLET | Freq: Every day | ORAL | 1 refills | Status: DC

## 2021-07-02 MED ORDER — AMIODARONE HCL 200 MG PO TABS: 200.0000 mg | ORAL_TABLET | Freq: Two times a day (BID) | ORAL | 1 refills | Status: DC

## 2021-07-02 MED ORDER — METOLAZONE 2.5 MG PO TABS: 2.5000 mg | ORAL_TABLET | ORAL | 0 refills | Status: DC

## 2021-07-02 NOTE — Progress Notes (Signed)
ReDS Vest / Clip - 07/02/21 1300   ? ?  ? ReDS Vest / Clip  ? Station Marker D   ? Ruler Value 40   ? ReDS Value Range High volume overload   ? ReDS Actual Value 58   ? ?  ?  ? ?  ? ? ?

## 2021-07-02 NOTE — H&P (View-Only) (Signed)
PCP: [facility] Primary Cardiologist:DrBensimhon  HPI: Randy [name] is a 51 year old with a history of HFrEF, permanent A fib, HTN, hypertensive retinopathy, CKD Stage IIIa, hyperlipidemia, DMII, Bells palsy, CVA, and OSA.    Admitted in 12/02/2019 with newly diagnosed A fib + ischemic CVA. He did not meet criteria for IV tPA. MRI showed large acute focal infarct, left frontal lobe predominantly ACA distribution. CT angiogram of the head and neck performed revealing no significant carotid, vertebral or intracranial stenosis. Echocardiogram obtained with bubble study showing no cardiac etiology for stroke. LVEF was 40 to 45%.Placed on Toprol XL. Creatinine on 12/04/20 1.7.    Followed at [facility] for cardiology services. Saw Norberto Sorenson on 12/12/2019. Set up for sleep study. Using CPAP 3 times a week.   Presented to [facility] with A/C HFrEF and A fib RVR.  Echo completed. EF down to 30-35% with speckled appears concerning for amyloid.Diuresed with IV lasix and placed on Amio drip. Once diuresed had TEE-DC-CV with restoration of NSR. Had RHC/LHC with min obs CAD, elevated filling pressures and normal cardiac output.  Diuresed 40 pounds. Discharge weight 367 pound  Echo today 07/02/21 EF 55% severe LVH  Says he has felt great but over last 3 days has been accumulating fluid. Weight up 20 pounds. SOB with mild activity. + orthopnea and PND.  Does not have extra lasix at home. Not wearing CPAP regularly.   Cardiac Testing Echo 01/27/21 EF 30-35% specked appearance concerning for amyloid. RV normal. LA severely dilated. Severe LVH.  Echo 11/2019 40-45% and severe LVH   RHC/LHC 01/29/21  Ao = 163/108 (134) LV = 136/25 RA =  15 RV = 51/13 PA = 57/19 (39) PCW = 28 Fick cardiac output/index = 9.2/3.0 PVR = 1.2 WU Ao sat = 94% PA sat = 65%, 65%  Assessment:  1. Mild non-obstructive CAD 2. NICM (likely hypertensive) with EF 35% by echo 3. Elevated filling pressures with normal CO 4. Severe systemic  HTN ROS: All systems negative except as listed in HPI, PMH and Problem List.  SH:  Social History   Socioeconomic History   Marital status: Married    Spouse name: Randy [name]   Number of children: 3   Years of education: Not on file   Highest education level: Bachelor's degree (e.g., BA, AB, BS)  Occupational History   Occupation: Tax Forensic psychologist    Comment: [place]  Tobacco Use   Smoking status: Never   Smokeless tobacco: Never   Tobacco comments:    monthly, when going out  Vaping Use   Vaping Use: Never used  Substance and Sexual Activity   Alcohol use: Yes    Comment: Occasional   Drug use: No   Sexual activity: Not on file  Other Topics Concern   Not on file  Social History Narrative   Newly married, 2012, 1 daughter, 1 son   No injectable steroid cycles   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, 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)   Regular exercise-yes   Social Determinants of Health   Financial Resource Strain: Low Risk    Difficulty of Paying Living Expenses: Not hard at all  Food Insecurity: No Food Insecurity   Worried About Charity fundraiser in the Last Year: Never true   Ran Out of Food in the Last Year: Never true  Transportation Needs: No Transportation Needs   Lack of Transportation (Medical): No   Lack of Transportation (Non-Medical): No  Physical  Activity: Not on file  Stress: Not on file  Social Connections: Not on file  Intimate Partner Violence: Not on file    FH:  Family History  Problem Relation Age of Onset   Hypertension Mother    Hypertension Father    Diabetes Father    Hypertension Brother    Diabetes Sister    Coronary artery disease Neg Hx    Stroke Neg Hx    Cancer Neg Hx    Kidney disease Paternal Grandmother        ESRD    Past Medical History:  Diagnosis Date   CKD (chronic kidney disease) stage 3, GFR 30-59 ml/min (HCC)    baseline Cr 1.5-1.7   Decreased visual acuity    Left eye, resolved -  hypertensive retinopathy   Diabetes mellitus 2012   dx hospitalization with A1c 6.6%   Hilar adenopathy    on CT scan 02/2010, on rpt scan stable/improved.   History of Bell's palsy 9/09   history, Left   History of headache    HTN (hypertension), malignant    previously on BC and goody powders for HA   Internal derangement of knee 9/09   Left   Microalbuminuria    Morbid obesity ([place])    Stroke ([place])    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[place])    echo 2011 with nonischemic hypertensive cardiomyopathy   Systolic murmur    Vitamin D deficiency     Current Outpatient Medications  Medication Sig Dispense Refill   amiodarone (PACERONE) 200 MG tablet Take 1 tablet (200 mg total) by mouth daily. 90 tablet 1   apixaban (ELIQUIS) 5 MG TABS tablet Take 1 tablet (5 mg total) by mouth 2 (two) times daily. 180 tablet 1   atorvastatin (LIPITOR) 20 MG tablet Take 1 tablet (20 mg total) by mouth at bedtime. 90 tablet 1   empagliflozin (JARDIANCE) 10 MG TABS tablet Take 1 tablet (10 mg total) by mouth daily. 90 tablet 1   furosemide (LASIX) 80 MG tablet Take 0.5 tablets (40 mg total) by mouth daily. 45 tablet 1   glimepiride (AMARYL) 4 MG tablet Take 4 mg by mouth daily with breakfast.     isosorbide-hydrALAZINE (BIDIL) 20-37.5 MG tablet Take 2 tablets by mouth 3 (three) times daily. 180 tablet 3   metoprolol succinate (TOPROL-XL) 100 MG 24 hr tablet Take 1 tablet (100 mg total) by mouth daily. Take with or immediately following a meal. 90 tablet 1   sacubitril-valsartan (ENTRESTO) 97-103 MG Take 1 tablet by mouth 2 (two) times daily. 180 tablet 1   spironolactone (ALDACTONE) 25 MG tablet Take 1 tablet (25 mg total) by mouth daily. 90 tablet 1   latanoprost (XALATAN) 0.005 % ophthalmic solution Place 1 drop into both eyes at bedtime. (Patient not taking: Reported on 07/02/2021)     No current facility-administered medications for this encounter.    Vitals:   07/02/21 1206  BP: 130/80  Pulse: 94  SpO2: 97%   Weight: (!) 180.7 kg (398 lb 6.4 oz)    Wt Readings from Last 3 Encounters:  07/02/21 (!) 180.7 kg (398 lb 6.4 oz)  02/11/21 (!) 168.9 kg (372 lb 6.4 oz)  01/31/21 (!) 171.1 kg (377 lb 3.3 oz)    PHYSICAL EXAM: General:  Large male No resp difficulty HEENT: normal Neck: supple. JVP to ear Carotids 2+ bilat; no bruits. No lymphadenopathy or thryomegaly appreciated. Cor: PMI nondisplaced. Irregular rate & rhythm. No rubs, gallops or murmurs. Lungs: clear Abdomen:  obese soft, nontender, nondistended. No hepatosplenomegaly. No bruits or masses. Good bowel sounds. Extremities: no cyanosis, clubbing, rash, 2+ edema Neuro: alert & orientedx3, cranial nerves grossly intact. moves all 4 extremities w/o difficulty. Affect pleasant  ECG: AFL 82 Personally reviewed   ASSESSMENT & PLAN:  1. Acute of chronic HFrEF - NICM. LHC w/ mild nonobstructive CAD. Suspect HTN cardiomyopathy.   -ECHO 01/31/21 EF 30-35% with severe LVH. Doubt amyloid with uncontrolled hypertension. - Echo today EF 55-60% (recovered EF)  - Has been NYHA I but over last few days NYHA III with elevated volume in setting of recurrent AF. Has been off lasix  - Continue  Toprol XL 100 mg daily - Continue Bidil 2 tablets  tid - Continue Spiro 25 mg daily.  - Continue  Entresto 97-103 mg twice a day.    -Continue Jardiance 10 mg daily.  - Resume lasix 80 daily with metolazone 2.5 x 2 days (Can likely switch lasix to prn once he is back in NSR and stabilized) - Plan DC-CV later this week   2. Hypertension  - Blood pressure well controlled. Continue current regimen.  3. PAF   - 01/31/21 S/P TEE/DC-CV  with restoration NSR  - Back in AFL today. - Continue  Toprol XL to 100 mg daily  - Continue Eliquis 5 mg bid  - Start amio 200 bid - Plan DC-CV this week   4. CKD Stage IIIb  - Creatinine baseline 1.7-1.9 - Followed by twice a year by Nephrology in [place].  - Check BMET    5 DMII -Hgb A1C 6.3 - needs follow up  with PCP - on Jardiance 10    6. OSA - Not using CPAP  - Encouraged him to restart   7. CAD - mild nonobstructive on cath -  c/w statin, LDL Goal < 70  - on ? blocker - no ASA w/ Eliquis  - no s/s Eliquis  Total time spent 45 minutes. Over half that time spent discussing above.   Glori Bickers, MD  10:59 PM

## 2021-07-02 NOTE — Patient Instructions (Signed)
EKG done today. ? ?RedsClip done today.  ? ?Labs done today. We will contact you to only if your labs are abnormal. ? ?INCREASE Lasix to 80mg  (1 tablet) by mouth daily.  ? ?TAKE 1 TABLET OF METOLAZONE 2.5MG  TODAY WITH (2 TABLETS) OF POTASSIUM FOR 2 DAYS ONLY. ? ?INCREASE Amiodarone to 200mg  (1 tablet) by mouth 2 times daily.  ? ?Your physician recommends that you schedule a follow-up appointment in: 1-2 weeks with our NP/PA Clinic here in our office.  ? ?If you have any questions or concerns before your next appointment please send a message through [place] or call our office at [phone].   ? ?TO LEAVE A MESSAGE FOR THE NURSE SELECT OPTION 2, PLEASE LEAVE A MESSAGE INCLUDING: ?YOUR NAME ?DATE OF BIRTH ?CALL BACK NUMBER ?REASON FOR CALL**this is important as we prioritize the call backs ? ?YOU WILL RECEIVE A CALL BACK THE SAME DAY AS LONG AS YOU CALL BEFORE 4:00 PM ? ?Do the following things EVERYDAY: ?Weigh yourself in the morning before breakfast. Write it down and keep it in a log. ?Take your medicines as prescribed ?Eat low salt foods--Limit salt (sodium) to 2000 mg per day.  ?Stay as active as you can everyday ?Limit all fluids for the day to less than 2 liters ? ?At the [facility], you and your health needs are our priority. As part of our continuing mission to provide you with exceptional heart care, we have created designated Provider Care Teams. These Care Teams include your primary Cardiologist (physician) and Advanced Practice Providers (APPs- Physician Assistants and Nurse Practitioners) who all work together to provide you with the care you need, when you need it.  ? ?You may see any of the following providers on your designated Care Team at your next follow up: ?Dr [place] ?Dr [phone] ?Arvilla Meres, NP ?Marca Ancona, PA ?Jessica [place],NP ?Tonye Becket, PA ?Robbie Lis, PharmD ? ? ?Please be sure to bring in all your medications bottles to every  appointment.  ? ?You are scheduled for a Cardioversion on Friday March 10th 2023 with Dr. Karle Plumber.  Please arrive at the [facility] (Main Entrance A) at [facility]: [address] [place], [address] [place] at 6:30 am. (1 hour prior to procedure unless lab work is needed; if lab work is needed arrive 1.5 hours ahead) ? ?DIET: Nothing to eat or drink after midnight except a sip of water with medications (see medication instructions below) ? ?FYI: For your safety, and to allow [place] to monitor your vital signs accurately during the surgery/procedure we request that   ?if you have artificial nails, gel coating, SNS etc. Please have those removed prior to your surgery/procedure. Not having the nail coverings /polish removed may result in cancellation or delay of your surgery/procedure. ? ?Medication Instructions: Hold Lasix the day of your procedure.  ? ?You must have a responsible person to drive you home and stay in the waiting area during your procedure. Failure to do so could result in cancellation. ? ?[id number] cards. ? ?*Special Note: Every effort is made to have your procedure done on time. Occasionally there are emergencies that occur at the hospital that may cause delays. Please be patient if a delay does occur.  ? ? ?

## 2021-07-02 NOTE — Progress Notes (Addendum)
PCP: [facility] Primary Cardiologist:DrBensimhon  HPI: Randy [name] is a 51 year old with a history of HFrEF, permanent A fib, HTN, hypertensive retinopathy, CKD Stage IIIa, hyperlipidemia, DMII, Bells palsy, CVA, and OSA.    Admitted in 12/02/2019 with newly diagnosed A fib + ischemic CVA. He did not meet criteria for IV tPA. MRI showed large acute focal infarct, left frontal lobe predominantly ACA distribution. CT angiogram of the head and neck performed revealing no significant carotid, vertebral or intracranial stenosis. Echocardiogram obtained with bubble study showing no cardiac etiology for stroke. LVEF was 40 to 45%.Placed on Toprol XL. Creatinine on 12/04/20 1.7.    Followed at [facility] for cardiology services. Saw Norberto Sorenson on 12/12/2019. Set up for sleep study. Using CPAP 3 times a week.   Presented to [facility] with A/C HFrEF and A fib RVR.  Echo completed. EF down to 30-35% with speckled appears concerning for amyloid.Diuresed with IV lasix and placed on Amio drip. Once diuresed had TEE-DC-CV with restoration of NSR. Had RHC/LHC with min obs CAD, elevated filling pressures and normal cardiac output.  Diuresed 40 pounds. Discharge weight 367 pound  Echo today 07/02/21 EF 55% severe LVH  Says he has felt great but over last 3 days has been accumulating fluid. Weight up 20 pounds. SOB with mild activity. + orthopnea and PND.  Does not have extra lasix at home. Not wearing CPAP regularly.   Cardiac Testing Echo 01/27/21 EF 30-35% specked appearance concerning for amyloid. RV normal. LA severely dilated. Severe LVH.  Echo 11/2019 40-45% and severe LVH   RHC/LHC 01/29/21  Ao = 163/108 (134) LV = 136/25 RA =  15 RV = 51/13 PA = 57/19 (39) PCW = 28 Fick cardiac output/index = 9.2/3.0 PVR = 1.2 WU Ao sat = 94% PA sat = 65%, 65%  Assessment:  1. Mild non-obstructive CAD 2. NICM (likely hypertensive) with EF 35% by echo 3. Elevated filling pressures with normal CO 4. Severe systemic  HTN ROS: All systems negative except as listed in HPI, PMH and Problem List.  SH:  Social History   Socioeconomic History   Marital status: Married    Spouse name: Arlene Ivers   Number of children: 3   Years of education: Not on file   Highest education level: Bachelor's degree (e.g., BA, AB, BS)  Occupational History   Occupation: Tax Forensic psychologist    Comment: [place]  Tobacco Use   Smoking status: Never   Smokeless tobacco: Never   Tobacco comments:    monthly, when going out  Vaping Use   Vaping Use: Never used  Substance and Sexual Activity   Alcohol use: Yes    Comment: Occasional   Drug use: No   Sexual activity: Not on file  Other Topics Concern   Not on file  Social History Narrative   Newly married, 2012, 1 daughter, 1 son   No injectable steroid cycles   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, 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)   Regular exercise-yes   Social Determinants of Health   Financial Resource Strain: Low Risk    Difficulty of Paying Living Expenses: Not hard at all  Food Insecurity: No Food Insecurity   Worried About Charity fundraiser in the Last Year: Never true   Ran Out of Food in the Last Year: Never true  Transportation Needs: No Transportation Needs   Lack of Transportation (Medical): No   Lack of Transportation (Non-Medical): No  Physical  Activity: Not on file  Stress: Not on file  Social Connections: Not on file  Intimate Partner Violence: Not on file    FH:  Family History  Problem Relation Age of Onset   Hypertension Mother    Hypertension Father    Diabetes Father    Hypertension Brother    Diabetes Sister    Coronary artery disease Neg Hx    Stroke Neg Hx    Cancer Neg Hx    Kidney disease Paternal Grandmother        ESRD    Past Medical History:  Diagnosis Date   CKD (chronic kidney disease) stage 3, GFR 30-59 ml/min (HCC)    baseline Cr 1.5-1.7   Decreased visual acuity    Left eye, resolved -  hypertensive retinopathy   Diabetes mellitus 2012   dx hospitalization with A1c 6.6%   Hilar adenopathy    on CT scan 02/2010, on rpt scan stable/improved.   History of Bell's palsy 9/09   history, Left   History of headache    HTN (hypertension), malignant    previously on BC and goody powders for HA   Internal derangement of knee 9/09   Left   Microalbuminuria    Morbid obesity ([place])    Stroke ([place])    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[place])    echo 2011 with nonischemic hypertensive cardiomyopathy   Systolic murmur    Vitamin D deficiency     Current Outpatient Medications  Medication Sig Dispense Refill   amiodarone (PACERONE) 200 MG tablet Take 1 tablet (200 mg total) by mouth daily. 90 tablet 1   apixaban (ELIQUIS) 5 MG TABS tablet Take 1 tablet (5 mg total) by mouth 2 (two) times daily. 180 tablet 1   atorvastatin (LIPITOR) 20 MG tablet Take 1 tablet (20 mg total) by mouth at bedtime. 90 tablet 1   empagliflozin (JARDIANCE) 10 MG TABS tablet Take 1 tablet (10 mg total) by mouth daily. 90 tablet 1   furosemide (LASIX) 80 MG tablet Take 0.5 tablets (40 mg total) by mouth daily. 45 tablet 1   glimepiride (AMARYL) 4 MG tablet Take 4 mg by mouth daily with breakfast.     isosorbide-hydrALAZINE (BIDIL) 20-37.5 MG tablet Take 2 tablets by mouth 3 (three) times daily. 180 tablet 3   metoprolol succinate (TOPROL-XL) 100 MG 24 hr tablet Take 1 tablet (100 mg total) by mouth daily. Take with or immediately following a meal. 90 tablet 1   sacubitril-valsartan (ENTRESTO) 97-103 MG Take 1 tablet by mouth 2 (two) times daily. 180 tablet 1   spironolactone (ALDACTONE) 25 MG tablet Take 1 tablet (25 mg total) by mouth daily. 90 tablet 1   latanoprost (XALATAN) 0.005 % ophthalmic solution Place 1 drop into both eyes at bedtime. (Patient not taking: Reported on 07/02/2021)     No current facility-administered medications for this encounter.    Vitals:   07/02/21 1206  BP: 130/80  Pulse: 94  SpO2: 97%   Weight: (!) 180.7 kg (398 lb 6.4 oz)    Wt Readings from Last 3 Encounters:  07/02/21 (!) 180.7 kg (398 lb 6.4 oz)  02/11/21 (!) 168.9 kg (372 lb 6.4 oz)  01/31/21 (!) 171.1 kg (377 lb 3.3 oz)    PHYSICAL EXAM: General:  Large male No resp difficulty HEENT: normal Neck: supple. JVP to ear Carotids 2+ bilat; no bruits. No lymphadenopathy or thryomegaly appreciated. Cor: PMI nondisplaced. Irregular rate & rhythm. No rubs, gallops or murmurs. Lungs: clear Abdomen:  obese soft, nontender, nondistended. No hepatosplenomegaly. No bruits or masses. Good bowel sounds. Extremities: no cyanosis, clubbing, rash, 2+ edema Neuro: alert & orientedx3, cranial nerves grossly intact. moves all 4 extremities w/o difficulty. Affect pleasant  ECG: AFL 82 Personally reviewed   ASSESSMENT & PLAN:  1. Acute of chronic HFrEF - NICM. LHC w/ mild nonobstructive CAD. Suspect HTN cardiomyopathy.   -ECHO 01/31/21 EF 30-35% with severe LVH. Doubt amyloid with uncontrolled hypertension. - Echo today EF 55-60% (recovered EF)  - Has been NYHA I but over last few days NYHA III with elevated volume in setting of recurrent AF. Has been off lasix  - Continue  Toprol XL 100 mg daily - Continue Bidil 2 tablets  tid - Continue Spiro 25 mg daily.  - Continue  Entresto 97-103 mg twice a day.    -Continue Jardiance 10 mg daily.  - Resume lasix 80 daily with metolazone 2.5 x 2 days (Can likely switch lasix to prn once he is back in NSR and stabilized) - Plan DC-CV later this week   2. Hypertension  - Blood pressure well controlled. Continue current regimen.  3. PAF   - 01/31/21 S/P TEE/DC-CV  with restoration NSR  - Back in AFL today. - Continue  Toprol XL to 100 mg daily  - Continue Eliquis 5 mg bid  - Start amio 200 bid - Plan DC-CV this week   4. CKD Stage IIIb  - Creatinine baseline 1.7-1.9 - Followed by twice a year by Nephrology in [place].  - Check BMET    5 DMII -Hgb A1C 6.3 - needs follow up  with PCP - on Jardiance 10    6. OSA - Not using CPAP  - Encouraged him to restart   7. CAD - mild nonobstructive on cath -  c/w statin, LDL Goal < 70  - on ? blocker - no ASA w/ Eliquis  - no s/s Eliquis  Total time spent 45 minutes. Over half that time spent discussing above.   Glori Bickers, MD  10:59 PM

## 2021-07-03 NOTE — Anesthesia Preprocedure Evaluation (Addendum)
Anesthesia Evaluation  ?Patient identified by MRN, date of birth, ID band ?Patient awake ? ? ? ?Reviewed: ?Allergy & Precautions, NPO status , Patient's Chart, lab work & pertinent test results ? ?History of Anesthesia Complications ?Negative for: history of anesthetic complications ? ?Airway ?Mallampati: III ? ?TM Distance: >3 FB ?Neck ROM: Full ? ? ? Dental ? ?(+) Teeth Intact ?  ?Pulmonary ?neg pulmonary ROS,  ?  ?Pulmonary exam normal ? ? ? ? ? ? ? Cardiovascular ?hypertension, +CHF  ?Normal cardiovascular exam+ dysrhythmias Atrial Fibrillation  ? ? ?Echo 07/02/21: EF 55%, no RWMA, mild LV dilation, severe LVH, mildly reduced RVSF, severe LAE, mild RAE, mild-mod TR, AV sclerosis w/o stenosis, mod PR ?  ?Neuro/Psych ?CVA   ? GI/Hepatic ?negative GI ROS, Neg liver ROS,   ?Endo/Other  ?diabetesMorbid obesity ? Renal/GU ?CRFRenal disease  ?negative genitourinary ?  ?Musculoskeletal ?negative musculoskeletal ROS ?(+)  ? Abdominal ?  ?Peds ? Hematology ?negative hematology ROS ?(+)   ?Anesthesia Other Findings ? ? Reproductive/Obstetrics ? ?  ? ? ? ? ? ? ? ? ? ? ? ? ? ?  ?  ? ? ? ? ? ? ? ?Anesthesia Physical ?Anesthesia Plan ? ?ASA: 3 ? ?Anesthesia Plan: General  ? ?Post-op Pain Management: Minimal or no pain anticipated  ? ?Induction: Intravenous ? ?PONV Risk Score and Plan: TIVA and Treatment may vary due to age or medical condition ? ?Airway Management Planned: Mask ? ?Additional Equipment: None ? ?Intra-op Plan:  ? ?Post-operative Plan:  ? ?Informed Consent: I have reviewed the patients History and Physical, chart, labs and discussed the procedure including the risks, benefits and alternatives for the proposed anesthesia with the patient or authorized representative who has indicated his/her understanding and acceptance.  ? ? ? ? ? ?Plan Discussed with:  ? ?Anesthesia Plan Comments:   ? ? ? ? ? ?Anesthesia Quick Evaluation ? ?

## 2021-07-04 ENCOUNTER — Other Ambulatory Visit: Payer: Self-pay

## 2021-07-04 ENCOUNTER — Ambulatory Visit (HOSPITAL_COMMUNITY)
Admission: RE | Admit: 2021-07-04 | Discharge: 2021-07-04 | Disposition: A | Discharge status: Home / Self Care | Payer: 59 | Attending: Internal Medicine | Admitting: Internal Medicine

## 2021-07-04 ENCOUNTER — Ambulatory Visit (HOSPITAL_COMMUNITY): Payer: 59 | Admitting: Anesthesiology

## 2021-07-04 ENCOUNTER — Encounter (HOSPITAL_COMMUNITY)
Admission: RE | Disposition: A | Discharge status: Home / Self Care | Payer: Self-pay | Source: Home / Self Care | Attending: Internal Medicine

## 2021-07-04 ENCOUNTER — Ambulatory Visit (HOSPITAL_BASED_OUTPATIENT_CLINIC_OR_DEPARTMENT_OTHER): Payer: 59 | Admitting: Anesthesiology

## 2021-07-04 DIAGNOSIS — I480 Paroxysmal atrial fibrillation: Secondary | ICD-10-CM | POA: Insufficient documentation

## 2021-07-04 DIAGNOSIS — Z79899 Other long term (current) drug therapy: Secondary | ICD-10-CM | POA: Insufficient documentation

## 2021-07-04 DIAGNOSIS — I440 Atrioventricular block, first degree: Secondary | ICD-10-CM | POA: Insufficient documentation

## 2021-07-04 DIAGNOSIS — I130 Hypertensive heart and chronic kidney disease with heart failure and stage 1 through stage 4 chronic kidney disease, or unspecified chronic kidney disease: Secondary | ICD-10-CM | POA: Diagnosis not present

## 2021-07-04 DIAGNOSIS — I4891 Unspecified atrial fibrillation: Secondary | ICD-10-CM | POA: Diagnosis not present

## 2021-07-04 DIAGNOSIS — E1122 Type 2 diabetes mellitus with diabetic chronic kidney disease: Secondary | ICD-10-CM | POA: Diagnosis not present

## 2021-07-04 DIAGNOSIS — G4733 Obstructive sleep apnea (adult) (pediatric): Secondary | ICD-10-CM | POA: Insufficient documentation

## 2021-07-04 DIAGNOSIS — I509 Heart failure, unspecified: Secondary | ICD-10-CM | POA: Diagnosis not present

## 2021-07-04 DIAGNOSIS — Z7901 Long term (current) use of anticoagulants: Secondary | ICD-10-CM | POA: Insufficient documentation

## 2021-07-04 DIAGNOSIS — Z7984 Long term (current) use of oral hypoglycemic drugs: Secondary | ICD-10-CM | POA: Insufficient documentation

## 2021-07-04 DIAGNOSIS — I2510 Atherosclerotic heart disease of native coronary artery without angina pectoris: Secondary | ICD-10-CM | POA: Insufficient documentation

## 2021-07-04 DIAGNOSIS — N1832 Chronic kidney disease, stage 3b: Secondary | ICD-10-CM | POA: Insufficient documentation

## 2021-07-04 DIAGNOSIS — I5020 Unspecified systolic (congestive) heart failure: Secondary | ICD-10-CM | POA: Insufficient documentation

## 2021-07-04 DIAGNOSIS — I428 Other cardiomyopathies: Secondary | ICD-10-CM | POA: Insufficient documentation

## 2021-07-04 HISTORY — PX: CARDIOVERSION: SHX1299

## 2021-07-04 LAB — GLUCOSE, CAPILLARY: Glucose-Capillary: 195 mg/dL — ABNORMAL HIGH (ref 70–99)

## 2021-07-04 SURGERY — CARDIOVERSION
Anesthesia: General

## 2021-07-04 MED ORDER — PROPOFOL 10 MG/ML IV BOLUS
INTRAVENOUS | Status: DC | PRN
  Administered 2021-07-04: 100 mg via INTRAVENOUS

## 2021-07-04 MED ORDER — SODIUM CHLORIDE 0.9 % IV SOLN: INTRAVENOUS | Status: DC | PRN

## 2021-07-04 MED ORDER — SODIUM CHLORIDE 0.9 % IV SOLN: INTRAVENOUS | Status: DC

## 2021-07-04 MED ORDER — LIDOCAINE 2% (20 MG/ML) 5 ML SYRINGE
INTRAMUSCULAR | Status: DC | PRN
  Administered 2021-07-04: 100 mg via INTRAVENOUS

## 2021-07-04 NOTE — Discharge Instructions (Signed)

## 2021-07-04 NOTE — CV Procedure (Signed)
? ?   DIRECT CURRENT CARDIOVERSION ? ?NAME:  Randy [name]   MRN: [id number] ?DOB:  Feb 07, 1971   ADMIT DATE: 07/04/2021 ? ? ?INDICATIONS: Atrial fibrillation  ? ? ?PROCEDURE:  ? ?Informed consent was obtained prior to the procedure. The risks, benefits and alternatives for the procedure were discussed and the patient comprehended these risks. Once an appropriate time out was taken, the patient had the defibrillator pads placed in the anterior and posterior position. The patient then underwent sedation by the anesthesia service. Once an appropriate level of sedation was achieved, the patient received a single biphasic, synchronized 200J shock with manual pressure held on the anterior pad. There was prompt conversion to sinus rhythm. No apparent complications. ? ?Arvilla Meres, MD  ?7:50 AM  ?

## 2021-07-04 NOTE — Anesthesia Procedure Notes (Signed)
Date/Time: 07/04/2021 7:40 AM ?Performed by: Laruth Bouchard., CRNA ?Pre-anesthesia Checklist: Patient identified, Emergency Drugs available, Suction available, Patient being monitored and Timeout performed ?Patient Re-evaluated:Patient Re-evaluated prior to induction ?Oxygen Delivery Method: Ambu bag ?Preoxygenation: Pre-oxygenation with 100% oxygen ?Induction Type: IV induction ?Placement Confirmation: positive ETCO2 ? ? ? ? ?

## 2021-07-04 NOTE — Transfer of Care (Signed)
Immediate Anesthesia Transfer of Care Note ? ?Patient: Randy [name] ? ?Procedure(s) Performed: CARDIOVERSION ? ?Patient Location: PACU and Endoscopy Unit ? ?Anesthesia Type:General ? ?Level of Consciousness: awake and drowsy ? ?Airway & Oxygen Therapy: Patient Spontanous Breathing ? ?Post-op Assessment: Report given to RN and Post -op Vital signs reviewed and stable ? ?Post vital signs: Reviewed and stable ? ?Last Vitals:  ?Vitals Value Taken Time  ?BP    ?Temp    ?Pulse    ?Resp    ?SpO2    ? ? ?Last Pain:  ?Vitals:  ? 07/04/21 0657  ?TempSrc: Temporal  ?PainSc: 0-No pain  ?   ? ?  ? ?Complications: No notable events documented. ?

## 2021-07-04 NOTE — Interval H&P Note (Signed)
History and Physical Interval Note: ? ?07/04/2021 ?7:40 AM ? ?Randy [name]  has presented today for surgery, with the diagnosis of afib.  The various methods of treatment have been discussed with the patient and family. After consideration of risks, benefits and other options for treatment, the patient has consented to  Procedure(s): ?CARDIOVERSION (N/A) as a surgical intervention.  The patient's history has been reviewed, patient examined, no change in status, stable for surgery.  I have reviewed the patient's chart and labs.  Questions were answered to the patient's satisfaction.   ? ? ?Randy [name] ? ? ?

## 2021-07-04 NOTE — Progress Notes (Signed)
Pt's BP 160s-170s/110s post cardioversion.  Per Dr. Gala Romney and Dr. Clemens Catholic, ok for patient to take home meds now prior to discharge. ? ?Roselie Awkward, RN ? ?

## 2021-07-04 NOTE — Anesthesia Postprocedure Evaluation (Signed)
Anesthesia Post Note ? ?Patient: Randy [name] ? ?Procedure(s) Performed: CARDIOVERSION ? ?  ? ?Patient location during evaluation: Endoscopy ?Anesthesia Type: General ?Level of consciousness: awake and alert ?Pain management: pain level controlled ?Vital Signs Assessment: post-procedure vital signs reviewed and stable ?Respiratory status: spontaneous breathing, nonlabored ventilation and respiratory function stable ?Cardiovascular status: blood pressure returned to baseline and stable ?Postop Assessment: no apparent nausea or vomiting ?Anesthetic complications: no ? ? ?No notable events documented. ? ?Last Vitals:  ?Vitals:  ? 07/04/21 0800 07/04/21 0810  ?BP: (!) 160/115 (!) 166/116  ?Pulse: (!) 59 (!) 58  ?Resp: 13 16  ?Temp:    ?SpO2: 98% 97%  ?  ?Last Pain:  ?Vitals:  ? 07/04/21 0800  ?TempSrc:   ?PainSc: 0-No pain  ? ? ?  ?  ?  ?  ?  ?  ? ?Lucretia Kern ? ? ? ? ?

## 2021-07-07 ENCOUNTER — Encounter (HOSPITAL_COMMUNITY): Payer: Self-pay | Admitting: Internal Medicine

## 2021-07-11 ENCOUNTER — Telehealth (HOSPITAL_COMMUNITY): Payer: Self-pay

## 2021-07-11 NOTE — Telephone Encounter (Signed)
Called and left patient a voice message to confirm/remind patient of their appointment at the [facility] on 07/14/21.  ? ? ? ?

## 2021-07-14 ENCOUNTER — Encounter (HOSPITAL_COMMUNITY): Payer: 59

## 2021-07-14 NOTE — Progress Notes (Incomplete)
?[facility] Note ?PCP: [facility] ?Primary Cardiologist: Dr Haroldine Laws ? ?HPI: ?[name] is a 51 y.o. with a history of HFrEF, permanent A fib, HTN, hypertensive retinopathy, CKD Stage IIIa, hyperlipidemia, DMII, Bells palsy, CVA, and OSA.  ?  ?Admitted in 12/02/2019 with newly diagnosed A fib + ischemic CVA. He did not meet criteria for IV tPA. MRI showed large acute focal infarct, left frontal lobe predominantly ACA distribution. CT angiogram of the head and neck performed revealing no significant carotid, vertebral or intracranial stenosis. Echocardiogram obtained with bubble study showing no cardiac etiology for stroke. LVEF was 40 to 45%.Placed on Toprol XL. Creatinine on 12/04/20 1.7.  ?  ?Followed at [facility] for cardiology services. Saw Randy [name] on 12/12/2019. Set up for sleep study. Using CPAP 3 times a week. ?  ?Admitted 10/22 to [facility] with A/C HFrEF and A fib RVR.  Echo showed EF down to 30-35% with speckled appears concerning for amyloid. Diuresed with IV lasix and amio drip started. Once diuresed, had TEE-DC-CV with restoration of NSR. Had RHC/LHC showing min obs CAD, elevated filling pressures and normal cardiac output.  Diuresed 40 pounds. Discharge weight 367 pounds. ? ?Echo 3/23 EF 55% severe LVH, he was back in AFL, volume up. Lasix 80 daily restarted and instructed to take metolazone x 2 days. Arranged for DCCV. ? ?S/p DCCV--> NSR 3/23. ? ?Today he returns for post cardioversion HF follow up. Overall feeling fine. Denies increasing SOB, CP, dizziness, edema, or PND/Orthopnea. Appetite ok. No fever or chills. Weight at home 170 pounds. Taking all medications. Not wearing CPAP. ? ? ?Cardiac Testing ?- Echo (3/23): EF 55%, severe LVH ? ?- Echo 01/27/21 EF 30-35% specked appearance concerning for amyloid. RV normal. LA severely dilated. Severe LVH.  ? ?- Echo 11/2019 40-45% and severe LVH ?  ?- R/LHC 01/29/21  ?Ao = 163/108 (134) ?LV = 136/25 ?RA =  15 ?RV = 51/13 ?PA = 57/19  (39) ?PCW = 28 ?Fick cardiac output/index = 9.2/3.0 ?PVR = 1.2 WU ?Ao sat = 94% ?PA sat = 65%, 65%  ?Assessment:  ?1. Mild non-obstructive CAD ?2. NICM (likely hypertensive) with EF 35% by echo ?3. Elevated filling pressures with normal CO ?4. Severe systemic HTN ? ?ROS: All systems negative except as listed in HPI, PMH and Problem List. ? ?SH:  ?Social History  ? ?Socioeconomic History  ? Marital status: Married  ?  Spouse name: Randy [name]  ? Number of children: 3  ? Years of education: Not on file  ? Highest education level: Bachelor's degree (e.g., BA, AB, BS)  ?Occupational History  ? 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  Comment: [employer]  ?Tobacco Use  ? Smoking status: Never  ? Smokeless tobacco: Never  ? Tobacco comments:  ?  monthly, when going out  ?Vaping Use  ? Vaping Use: Never used  ?Substance and Sexual Activity  ? Alcohol use: Yes  ?  Comment: Occasional  ? Drug use: No  ? Sexual activity: Not on file  ?Other Topics Concern  ? Not on file  ?Social History Narrative  ? Newly married, 2012, 1 daughter, 1 son  ? No injectable steroid cycles  ? 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 Regular exercise-yes  ? ?Social Determinants of Health  ? ?Financial Resource Strain: Low Risk   ? Difficulty of Paying Living Expenses: Not hard at all  ?Food Insecurity: No Food Insecurity  ? Worried  About Running Out of Food in the Last Year: Never true  ? Ran Out of Food in the Last Year: Never true  ?Transportation Needs: No Transportation Needs  ? Lack of Transportation (Medical): No  ? Lack of Transportation (Non-Medical): No  ?Physical Activity: Not on file  ?Stress: Not on file  ?Social Connections: Not on file  ?Intimate Partner Violence: Not on file  ? ?FH:  ?Family History  ?Problem Relation Age of Onset  ? Hypertension Mother   ? Hypertension Father   ? Diabetes Father   ? Hypertension Brother   ? Diabetes Sister   ? Coronary artery disease Neg Hx   ? Stroke Neg  Hx   ? Cancer Neg Hx   ? Kidney disease Paternal Grandmother   ?     ESRD  ? ?Past Medical History:  ?Diagnosis Date  ? CKD (chronic kidney disease) stage 3, GFR 30-59 ml/min (HCC)   ? baseline Cr 1.5-1.7  ? Decreased visual acuity   ? Left eye, resolved - hypertensive retinopathy  ? Diabetes mellitus 2012  ? dx hospitalization with A1c 6.6%  ? Hilar adenopathy   ? on CT scan 02/2010, on rpt scan stable/improved.  ? History of Bell's palsy 9/09  ? history, Left  ? History of headache   ? HTN (hypertension), malignant   ? previously on BC and goody powders for HA  ? Internal derangement of knee 9/09  ? Left  ? Microalbuminuria   ? Morbid obesity ([place])   ? Stroke [facility])   ? 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[place])   ? echo 2011 with nonischemic hypertensive cardiomyopathy  ? Systolic murmur   ? Vitamin D deficiency   ? ?Current Outpatient Medications  ?Medication Sig Dispense Refill  ? amiodarone (PACERONE) 200 MG tablet Take 1 tablet (200 mg total) by mouth 2 (two) times daily. 180 tablet 1  ? apixaban (ELIQUIS) 5 MG TABS tablet Take 1 tablet (5 mg total) by mouth 2 (two) times daily. (Patient taking differently: Take 2,030 mg by mouth 2 (two) times daily.) 180 tablet 1  ? atorvastatin (LIPITOR) 20 MG tablet Take 1 tablet (20 mg total) by mouth at bedtime. 90 tablet 1  ? empagliflozin (JARDIANCE) 10 MG TABS tablet Take 1 tablet (10 mg total) by mouth daily. (Patient taking differently: Take 2,030 mg by mouth daily.) 90 tablet 1  ? furosemide (LASIX) 80 MG tablet Take 1 tablet (80 mg total) by mouth daily. 90 tablet 1  ? glimepiride (AMARYL) 4 MG tablet Take 4 mg by mouth daily with breakfast.    ? isosorbide-hydrALAZINE (BIDIL) 20-37.5 MG tablet Take 2 tablets by mouth 3 (three) times daily. 180 tablet 3  ? latanoprost (XALATAN) 0.005 % ophthalmic solution Place 1 drop into both eyes at bedtime. (Patient not taking: Reported on 07/02/2021)    ? metolazone (ZAROXOLYN) 2.5 MG tablet Take 1 tablet (2.5 mg total) by mouth as directed. 10 tablet  0  ? metoprolol succinate (TOPROL-XL) 100 MG 24 hr tablet Take 1 tablet (100 mg total) by mouth daily. Take with or immediately following a meal. 90 tablet 1  ? potassium chloride SA (KLOR-CON M) 20 MEQ tablet Take 2 tablets by mouth only when taking Metolazone 30 tablet 0  ? sacubitril-valsartan (ENTRESTO) 97-103 MG Take 1 tablet by mouth 2 (two) times daily. 180 tablet 1  ? spironolactone (ALDACTONE) 25 MG tablet Take 1 tablet (25 mg total) by mouth daily. 90 tablet 1  ? ?No current facility-administered medications for  this visit.  ? ?There were no vitals filed for this visit. ? ?Wt Readings from Last 3 Encounters:  ?07/02/21 (!) 180.7 kg (398 lb 6.4 oz)  ?02/11/21 (!) 168.9 kg (372 lb 6.4 oz)  ?01/31/21 (!) 171.1 kg (377 lb 3.3 oz)  ? ?PHYSICAL EXAM: ?General:  NAD. No resp difficulty ?HEENT: Normal ?Neck: Supple. No JVD. Carotids 2+ bilat; no bruits. No lymphadenopathy or thryomegaly appreciated. ?Cor: PMI nondisplaced. Regular rate & rhythm. No rubs, gallops or murmurs. ?Lungs: Clear ?Abdomen: Soft, nontender, nondistended. No hepatosplenomegaly. No bruits or masses. Good bowel sounds. ?Extremities: No cyanosis, clubbing, rash, edema ?Neuro: Alert & oriented x 3, cranial nerves grossly intact. Moves all 4 extremities w/o difficulty. Affect pleasant. ? ?ECG: AFL 82 Personally reviewed ? ?ASSESSMENT & PLAN: ?1. Chronic HFrEF ?- NICM. LHC w/ mild nonobstructive CAD. Suspect HTN cardiomyopathy.  ?- Echo (10/22): EF 30-35% with severe LVH. Doubt amyloid with uncontrolled hypertension. ?- Echo (3/23): EF 55-60% (recovered EF)  ?- NYHA I, volume looks good today. ?- Continue Toprol XL 100 mg daily. ?- Continue Bidil 2 tablets tid. ?- Continue spiro 25 mg daily.  ?- Continue Entresto 97-103 mg bid.   ?- Continue Jardiance 10 mg daily.  ?- Resume lasix 80 daily with metolazone 2.5 x 2 days (Can likely switch lasix to prn once he is back in NSR and stabilized) ?- Labs today. ?  ?2. Hypertension  ?- Blood pressure well  controlled.  ?- Continue current regimen. ? ?3. PAF   ?- 01/31/21 S/P TEE/DC-CV  with restoration NSR  ?- Back in AFL today. ?- Continue  Toprol XL to 100 mg daily  ?- Continue Eliquis 5 mg bid  ?- Star

## 2021-08-29 ENCOUNTER — Other Ambulatory Visit (HOSPITAL_COMMUNITY): Payer: Self-pay | Admitting: Internal Medicine

## 2021-09-09 ENCOUNTER — Encounter (HOSPITAL_COMMUNITY): Payer: Self-pay | Admitting: Emergency Medicine

## 2021-09-09 ENCOUNTER — Ambulatory Visit (HOSPITAL_COMMUNITY)
Admission: EM | Admit: 2021-09-09 | Discharge: 2021-09-09 | Disposition: A | Discharge status: Home / Self Care | Payer: 59 | Attending: Family Medicine | Admitting: Family Medicine

## 2021-09-09 DIAGNOSIS — I10 Essential (primary) hypertension: Secondary | ICD-10-CM

## 2021-09-09 DIAGNOSIS — M79604 Pain in right leg: Secondary | ICD-10-CM | POA: Diagnosis not present

## 2021-09-09 DIAGNOSIS — S91101A Unspecified open wound of right great toe without damage to nail, initial encounter: Secondary | ICD-10-CM | POA: Diagnosis not present

## 2021-09-09 MED ORDER — AMOXICILLIN-POT CLAVULANATE 875-125 MG PO TABS: 1.0000 | ORAL_TABLET | Freq: Two times a day (BID) | ORAL | 0 refills | Status: DC

## 2021-09-09 MED ORDER — PREDNISONE 20 MG PO TABS: 40.0000 mg | ORAL_TABLET | Freq: Every day | ORAL | 0 refills | Status: DC

## 2021-09-09 MED ORDER — MUPIROCIN 2 % EX OINT: 1.0000 "application " | TOPICAL_OINTMENT | Freq: Two times a day (BID) | CUTANEOUS | 0 refills | Status: DC

## 2021-09-09 NOTE — Discharge Instructions (Addendum)
Your blood pressure was noted to be elevated during your visit today. If you are currently taking medication for high blood pressure, please ensure you are taking this as directed. If you do not have a history of high blood pressure and your blood pressure remains persistently elevated, you may need to begin taking a medication at some point. You may return here within the next few days to recheck if unable to see your primary care provider or if you do not have a one. ? ?BP (!) 169/103 (BP Location: Right Arm)   Pulse 62   Temp 98.7 ?F (37.1 ?C) (Oral)   Resp 18   Ht 6\' 8"  (2.032 m)   Wt (!) 180.7 kg   SpO2 96%   BMI 43.76 kg/m?  ? ?BP Readings from Last 3 Encounters:  ?09/09/21 (!) 169/103  ?07/04/21 (!) 166/116  ?07/02/21 130/80  ? ? ? ?

## 2021-09-09 NOTE — ED Provider Notes (Addendum)
?[place] ? ? ?[id number] ?09/09/21 Arrival Time: 3818 ? ?ASSESSMENT & PLAN: ? ?1. Right leg pain   ?2. Open wound of right great toe, initial encounter   ?3. Elevated blood pressure reading in office with diagnosis of hypertension   ?In a diabetic patient. ?Recommend wound care. No s/s of DVT; discussed. Ques superficial thrombophlebitis of RLE. ? ?Discharge Medication List as of 09/09/2021 11:40 AM  ?  ? ?START taking these medications  ? Details  ?amoxicillin-clavulanate (AUGMENTIN) 875-125 MG tablet Take 1 tablet by mouth every 12 (twelve) hours., Starting Tue 09/09/2021, Normal  ?  ?mupirocin ointment (BACTROBAN) 2 % Apply 1 application. topically 2 (two) times daily., Starting Tue 09/09/2021, Normal  ?  ?predniSONE (DELTASONE) 20 MG tablet Take 2 tablets (40 mg total) by mouth daily., Starting Tue 09/09/2021, Normal  ?  ?  ? ?To keep R great toe wound clean and dry; no sign of frank infection but will begin antibiotic secondary to diabetes that he reports is indeed controlled. Understands blood sugar elevation with prednisone use. Hold NSAIDS secondary to kidney dysfunction. ?Orders Placed This Encounter  ?Procedures  ? Apply dressing  ? ? ?Recommend: ? Follow-up Information   ? ? Schedule an appointment as soon as possible for a visit  with Caren Macadam, MD.   ?Specialty: Family Medicine ?Contact information: ?[address] ?[place] [address] ?[phone] ? ? ?  ?  ? ? [facility].   ?Specialty: Urgent Care ?Why: If worsening or failing to improve as anticipated. ?Contact information: ?[address] ?[place] [address] ?[phone] ? ?  ?  ? ?  ?  ? ?  ? ? ?Reviewed expectations re: course of current medical issues. Questions answered. ?Outlined signs and symptoms indicating need for more acute intervention. ?Patient verbalized understanding. ?After Visit Summary given. ? ?SUBJECTIVE: ?History from: patient. ?Randy [name] is a 51 y.o. male  who reports tenderness/pain on inside of RLE just above ankle; noted 2 d ago; stable; "but really sore". Afebrile. Noted "scratch" on lateral R great toe approx 2 w ago; trying to keep clean. No wound bleeding or drainage but toe is sore. Ambulatory without difficulty. No extremity sensation changes or weakness. No calf swelling or pain. No trauma. ? ?Past Surgical History:  ?Procedure Laterality Date  ? CARDIOVERSION N/A 01/31/2021  ? Procedure: CARDIOVERSION;  Surgeon: Jolaine Artist, MD;  Location: [place];  Service: Cardiovascular;  Laterality: N/A;  ? CARDIOVERSION N/A 07/04/2021  ? Procedure: CARDIOVERSION;  Surgeon: Jolaine Artist, MD;  Location: [facility] ENDOSCOPY;  Service: Cardiovascular;  Laterality: N/A;  ? hospitalization  11/2008  ? malignant HTN, nl SPEP/UPEP, neg ANCA panel, nl C3/4, neg anti GBM Ab, neg Hep A/B/C, nl renal US, nl PTH, neg HIV  ? RIGHT/LEFT HEART CATH AND CORONARY ANGIOGRAPHY N/A 01/29/2021  ? Procedure: RIGHT/LEFT HEART CATH AND CORONARY ANGIOGRAPHY;  Surgeon: Jolaine Artist, MD;  Location: [place] CV LAB;  Service: Cardiovascular;  Laterality: N/A;  ? TEE WITHOUT CARDIOVERSION N/A 01/31/2021  ? Procedure: TRANSESOPHAGEAL ECHOCARDIOGRAM (TEE);  Surgeon: Jolaine Artist, MD;  Location: [facility] ENDOSCOPY;  Service: Cardiovascular;  Laterality: N/A;  ? US ECHOCARDIOGRAPHY  11/2008  ? LVsys fxn EF 50%, mild MR, normal LV size, neg ANA, neg cryoglobulins  ? US ECHOCARDIOGRAPHY  2011  ? severe LVH, EF 40%, LA mildly dilated, PA pressure moderately increased  ?  ? ? ?OBJECTIVE: ? ?Vitals:  ? 09/09/21 1112 09/09/21 1113  ?  BP:  (!) 169/103  ?Pulse:  62  ?Resp:  18  ?Temp:  98.7 ?F (37.1 ?C)  ?TempSrc:  Oral  ?SpO2:  96%  ?Weight: (!) 180.7 kg   ?Height: 6\' 8"  (2.032 m)   ?  ?General appearance: alert; no distress ?HEENT: [place]; AT ?Neck: supple with FROM ?Resp: unlabored respirations ?Extremities: ?RLE: warm with well perfused appearance; R great toe with approx 1 x 1 cm lateral open  but healing wound; no bleeding or drainage; no significant erythema surrounding; on inner lower leg just above ankle he is very TTP; ques slight cord-like feel; no calf swelling or TTP; ankle and knee with FROM ?CV: brisk extremity capillary refill of RLE; 2+ DP pulse of RLE. ?Skin: warm and dry; no visible rashes ?Neurologic: gait normal; normal sensation and strength of RLE ?Psychological: alert and cooperative; normal mood and affect ? ? ?Allergies  ?Allergen Reactions  ? Fish-Derived Products Anaphylaxis  ? ? ?Past Medical History:  ?Diagnosis Date  ? CKD (chronic kidney disease) stage 3, GFR 30-59 ml/min (HCC)   ? baseline Cr 1.5-1.7  ? Decreased visual acuity   ? Left eye, resolved - hypertensive retinopathy  ? Diabetes mellitus 2012  ? dx hospitalization with A1c 6.6%  ? Hilar adenopathy   ? on CT scan 02/2010, on rpt scan stable/improved.  ? History of Bell's palsy 9/09  ? history, Left  ? History of headache   ? HTN (hypertension), malignant   ? previously on BC and goody powders for HA  ? Internal derangement of knee 9/09  ? Left  ? Microalbuminuria   ? Morbid obesity ([place])   ? Stroke [facility])   ? 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[place])   ? echo 2011 with nonischemic hypertensive cardiomyopathy  ? Systolic murmur   ? Vitamin D deficiency   ? ?Social History  ? ?Socioeconomic History  ? Marital status: Married  ?  Spouse name: Joby Richart  ? Number of children: 3  ? Years of education: Not on file  ? Highest education level: Bachelor's degree (e.g., BA, AB, BS)  ?Occupational History  ? 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  Comment: [employer]  ?Tobacco Use  ? Smoking status: Never  ? Smokeless tobacco: Never  ? Tobacco comments:  ?  monthly, when going out  ?Vaping Use  ? Vaping Use: Never used  ?Substance and Sexual Activity  ? Alcohol use: Yes  ?  Comment: Occasional  ? Drug use: No  ? Sexual activity: Not on file  ?Other Topics Concern  ? Not on file  ?Social History Narrative  ? Newly married, 2012, 1 daughter, 1 son  ? No  injectable steroid cycles  ? 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 Regular exercise-yes  ? ?Social Determinants of Health  ? ?Financial Resource Strain: Low Risk   ? Difficulty of Paying Living Expenses: Not hard at all  ?Food Insecurity: No Food Insecurity  ? Worried About Charity fundraiser in the Last Year: Never true  ? Ran Out of Food in the Last Year: Never true  ?Transportation Needs: No Transportation Needs  ? Lack of Transportation (Medical): No  ? Lack of Transportation (Non-Medical): No  ?Physical Activity: Not on file  ?Stress: Not on file  ?Social Connections: Not on file  ? ?Family History  ?Problem Relation Age of Onset  ? Hypertension Mother   ? Hypertension Father   ? Diabetes Father   ? Hypertension Brother   ?  Diabetes Sister   ? Coronary artery disease Neg Hx   ? Stroke Neg Hx   ? Cancer Neg Hx   ? Kidney disease Paternal Grandmother   ?     ESRD  ? ?Past Surgical History:  ?Procedure Laterality Date  ? CARDIOVERSION N/A 01/31/2021  ? Procedure: CARDIOVERSION;  Surgeon: Jolaine Artist, MD;  Location: [place];  Service: Cardiovascular;  Laterality: N/A;  ? CARDIOVERSION N/A 07/04/2021  ? Procedure: CARDIOVERSION;  Surgeon: Jolaine Artist, MD;  Location: [facility] ENDOSCOPY;  Service: Cardiovascular;  Laterality: N/A;  ? hospitalization  11/2008  ? malignant HTN, nl SPEP/UPEP, neg ANCA panel, nl C3/4, neg anti GBM Ab, neg Hep A/B/C, nl renal US, nl PTH, neg HIV  ? RIGHT/LEFT HEART CATH AND CORONARY ANGIOGRAPHY N/A 01/29/2021  ? Procedure: RIGHT/LEFT HEART CATH AND CORONARY ANGIOGRAPHY;  Surgeon: Jolaine Artist, MD;  Location: [place] CV LAB;  Service: Cardiovascular;  Laterality: N/A;  ? TEE WITHOUT CARDIOVERSION N/A 01/31/2021  ? Procedure: TRANSESOPHAGEAL ECHOCARDIOGRAM (TEE);  Surgeon: Jolaine Artist, MD;  Location: [facility] ENDOSCOPY;  Service: Cardiovascular;  Laterality: N/A;  ? US ECHOCARDIOGRAPHY  11/2008  ? LVsys fxn EF 50%, mild MR,  normal LV size, neg ANA, neg cryoglobulins  ? US ECHOCARDIOGRAPHY  2011  ? severe LVH, EF 40%, LA mildly dilated, PA pressure moderately increased  ? ? ? ?  ?Vanessa Kick, MD ?09/09/21 1245 ? ?  ?H

## 2021-09-09 NOTE — ED Triage Notes (Signed)
Pt reports right leg pain x 2 days. States it feels like a cramp that wont go away. States sitting down helps the pain. Denies any obvious injuries.  ? ?

## 2021-09-14 ENCOUNTER — Encounter (HOSPITAL_COMMUNITY): Payer: Self-pay

## 2021-09-14 ENCOUNTER — Inpatient Hospital Stay (HOSPITAL_COMMUNITY)
Admission: EM | Admit: 2021-09-14 | Discharge: 2021-09-19 | DRG: 617 | Disposition: A | Discharge status: Home / Self Care | Payer: 59 | Attending: Infectious Diseases | Admitting: Infectious Diseases

## 2021-09-14 ENCOUNTER — Emergency Department (HOSPITAL_COMMUNITY): Payer: 59

## 2021-09-14 ENCOUNTER — Other Ambulatory Visit: Payer: Self-pay

## 2021-09-14 DIAGNOSIS — G4733 Obstructive sleep apnea (adult) (pediatric): Secondary | ICD-10-CM | POA: Diagnosis present

## 2021-09-14 DIAGNOSIS — Z79899 Other long term (current) drug therapy: Secondary | ICD-10-CM

## 2021-09-14 DIAGNOSIS — L03115 Cellulitis of right lower limb: Principal | ICD-10-CM | POA: Diagnosis present

## 2021-09-14 DIAGNOSIS — Z8249 Family history of ischemic heart disease and other diseases of the circulatory system: Secondary | ICD-10-CM

## 2021-09-14 DIAGNOSIS — E1140 Type 2 diabetes mellitus with diabetic neuropathy, unspecified: Secondary | ICD-10-CM | POA: Diagnosis present

## 2021-09-14 DIAGNOSIS — E1165 Type 2 diabetes mellitus with hyperglycemia: Secondary | ICD-10-CM | POA: Diagnosis present

## 2021-09-14 DIAGNOSIS — I5021 Acute systolic (congestive) heart failure: Secondary | ICD-10-CM

## 2021-09-14 DIAGNOSIS — Z91013 Allergy to seafood: Secondary | ICD-10-CM

## 2021-09-14 DIAGNOSIS — I5022 Chronic systolic (congestive) heart failure: Secondary | ICD-10-CM | POA: Diagnosis present

## 2021-09-14 DIAGNOSIS — L02611 Cutaneous abscess of right foot: Secondary | ICD-10-CM

## 2021-09-14 DIAGNOSIS — L0390 Cellulitis, unspecified: Secondary | ICD-10-CM

## 2021-09-14 DIAGNOSIS — Z7901 Long term (current) use of anticoagulants: Secondary | ICD-10-CM

## 2021-09-14 DIAGNOSIS — Z7984 Long term (current) use of oral hypoglycemic drugs: Secondary | ICD-10-CM

## 2021-09-14 DIAGNOSIS — E1169 Type 2 diabetes mellitus with other specified complication: Principal | ICD-10-CM | POA: Diagnosis present

## 2021-09-14 DIAGNOSIS — I130 Hypertensive heart and chronic kidney disease with heart failure and stage 1 through stage 4 chronic kidney disease, or unspecified chronic kidney disease: Secondary | ICD-10-CM | POA: Diagnosis present

## 2021-09-14 DIAGNOSIS — Z7985 Long-term (current) use of injectable non-insulin antidiabetic drugs: Secondary | ICD-10-CM

## 2021-09-14 DIAGNOSIS — M729 Fibroblastic disorder, unspecified: Secondary | ICD-10-CM | POA: Diagnosis present

## 2021-09-14 DIAGNOSIS — S98132A Complete traumatic amputation of one left lesser toe, initial encounter: Secondary | ICD-10-CM

## 2021-09-14 DIAGNOSIS — I4821 Permanent atrial fibrillation: Secondary | ICD-10-CM | POA: Diagnosis present

## 2021-09-14 DIAGNOSIS — N179 Acute kidney failure, unspecified: Secondary | ICD-10-CM | POA: Diagnosis present

## 2021-09-14 DIAGNOSIS — I428 Other cardiomyopathies: Secondary | ICD-10-CM | POA: Diagnosis present

## 2021-09-14 DIAGNOSIS — M869 Osteomyelitis, unspecified: Secondary | ICD-10-CM

## 2021-09-14 DIAGNOSIS — L03031 Cellulitis of right toe: Secondary | ICD-10-CM | POA: Diagnosis present

## 2021-09-14 DIAGNOSIS — E1122 Type 2 diabetes mellitus with diabetic chronic kidney disease: Secondary | ICD-10-CM | POA: Diagnosis present

## 2021-09-14 DIAGNOSIS — M79604 Pain in right leg: Secondary | ICD-10-CM

## 2021-09-14 DIAGNOSIS — Z6841 Body Mass Index (BMI) 40.0 and over, adult: Secondary | ICD-10-CM

## 2021-09-14 DIAGNOSIS — L97519 Non-pressure chronic ulcer of other part of right foot with unspecified severity: Secondary | ICD-10-CM | POA: Diagnosis present

## 2021-09-14 DIAGNOSIS — E11628 Type 2 diabetes mellitus with other skin complications: Secondary | ICD-10-CM | POA: Diagnosis present

## 2021-09-14 DIAGNOSIS — Z8673 Personal history of transient ischemic attack (TIA), and cerebral infarction without residual deficits: Secondary | ICD-10-CM

## 2021-09-14 DIAGNOSIS — E11621 Type 2 diabetes mellitus with foot ulcer: Secondary | ICD-10-CM | POA: Diagnosis present

## 2021-09-14 DIAGNOSIS — E785 Hyperlipidemia, unspecified: Secondary | ICD-10-CM | POA: Diagnosis present

## 2021-09-14 DIAGNOSIS — I2510 Atherosclerotic heart disease of native coronary artery without angina pectoris: Secondary | ICD-10-CM | POA: Diagnosis present

## 2021-09-14 DIAGNOSIS — N1832 Chronic kidney disease, stage 3b: Secondary | ICD-10-CM | POA: Diagnosis present

## 2021-09-14 DIAGNOSIS — Z833 Family history of diabetes mellitus: Secondary | ICD-10-CM

## 2021-09-14 LAB — CBC WITH DIFFERENTIAL/PLATELET
Abs Immature Granulocytes: 0.1 10*3/uL — ABNORMAL HIGH (ref 0.00–0.07)
Basophils Absolute: 0 10*3/uL (ref 0.0–0.1)
Basophils Relative: 0 %
Eosinophils Absolute: 0 10*3/uL (ref 0.0–0.5)
Eosinophils Relative: 0 %
HCT: 41.1 % (ref 39.0–52.0)
Hemoglobin: 13 g/dL (ref 13.0–17.0)
Immature Granulocytes: 1 %
Lymphocytes Relative: 13 %
Lymphs Abs: 2.3 10*3/uL (ref 0.7–4.0)
MCH: 26.2 pg (ref 26.0–34.0)
MCHC: 31.6 g/dL (ref 30.0–36.0)
MCV: 82.7 fL (ref 80.0–100.0)
Monocytes Absolute: 1.5 10*3/uL — ABNORMAL HIGH (ref 0.1–1.0)
Monocytes Relative: 8 %
Neutro Abs: 14.1 10*3/uL — ABNORMAL HIGH (ref 1.7–7.7)
Neutrophils Relative %: 78 %
Platelets: 316 10*3/uL (ref 150–400)
RBC: 4.97 MIL/uL (ref 4.22–5.81)
RDW: 15.1 % (ref 11.5–15.5)
WBC: 18 10*3/uL — ABNORMAL HIGH (ref 4.0–10.5)
nRBC: 0 % (ref 0.0–0.2)

## 2021-09-14 LAB — URINALYSIS, ROUTINE W REFLEX MICROSCOPIC
Bacteria, UA: NONE SEEN
Bilirubin Urine: NEGATIVE
Glucose, UA: >=500 mg/dL — AB
Hgb urine dipstick: NEGATIVE
Ketones, ur: NEGATIVE mg/dL
Leukocytes,Ua: NEGATIVE
Nitrite: NEGATIVE
Protein, ur: 30 mg/dL — AB
Specific Gravity, Urine: 1.013 (ref 1.005–1.030)
pH: 5 (ref 5.0–8.0)

## 2021-09-14 LAB — GLUCOSE, CAPILLARY: Glucose-Capillary: 298 mg/dL — ABNORMAL HIGH (ref 70–99)

## 2021-09-14 LAB — COMPREHENSIVE METABOLIC PANEL
ALT: 19 U/L (ref 0–44)
AST: 14 U/L — ABNORMAL LOW (ref 15–41)
Albumin: 2.9 g/dL — ABNORMAL LOW (ref 3.5–5.0)
Alkaline Phosphatase: 54 U/L (ref 38–126)
Anion gap: 7 (ref 5–15)
BUN: 26 mg/dL — ABNORMAL HIGH (ref 6–20)
CO2: 26 mmol/L (ref 22–32)
Calcium: 8.9 mg/dL (ref 8.9–10.3)
Chloride: 103 mmol/L (ref 98–111)
Creatinine, Ser: 2.4 mg/dL — ABNORMAL HIGH (ref 0.61–1.24)
GFR, Estimated: 32 mL/min — ABNORMAL LOW (ref >60)
Glucose, Bld: 261 mg/dL — ABNORMAL HIGH (ref 70–99)
Potassium: 3.9 mmol/L (ref 3.5–5.1)
Sodium: 136 mmol/L (ref 135–145)
Total Bilirubin: 0.9 mg/dL (ref 0.3–1.2)
Total Protein: 7 g/dL (ref 6.5–8.1)

## 2021-09-14 LAB — HEMOGLOBIN A1C
Hgb A1c MFr Bld: 10.1 % — ABNORMAL HIGH (ref 4.8–5.6)
Mean Plasma Glucose: 243.17 mg/dL

## 2021-09-14 LAB — CBG MONITORING, ED: Glucose-Capillary: 263 mg/dL — ABNORMAL HIGH (ref 70–99)

## 2021-09-14 LAB — CK: Total CK: 91 U/L (ref 49–397)

## 2021-09-14 LAB — LACTIC ACID, PLASMA
Lactic Acid, Venous: 1 mmol/L (ref 0.5–1.9)
Lactic Acid, Venous: 1 mmol/L (ref 0.5–1.9)

## 2021-09-14 MED ORDER — ATORVASTATIN CALCIUM 10 MG PO TABS
20.0000 mg | ORAL_TABLET | Freq: Every day | ORAL | Status: DC
Start: 2021-09-14 — End: 2021-09-19
  Administered 2021-09-14 – 2021-09-18 (×5): 20 mg via ORAL
  Filled 2021-09-14 (×5): qty 2

## 2021-09-14 MED ORDER — VANCOMYCIN HCL 1500 MG/300ML IV SOLN
1500.0000 mg | INTRAVENOUS | Status: AC
  Administered 2021-09-14: 1500 mg via INTRAVENOUS
  Filled 2021-09-14 (×2): qty 300

## 2021-09-14 MED ORDER — OXYCODONE-ACETAMINOPHEN 5-325 MG PO TABS
1.0000 | ORAL_TABLET | Freq: Once | ORAL | Status: AC
  Administered 2021-09-14: 1 via ORAL
  Filled 2021-09-14: qty 1

## 2021-09-14 MED ORDER — APIXABAN 5 MG PO TABS
5.0000 mg | ORAL_TABLET | Freq: Two times a day (BID) | ORAL | Status: DC
  Administered 2021-09-14 – 2021-09-19 (×9): 5 mg via ORAL
  Filled 2021-09-14 (×10): qty 1

## 2021-09-14 MED ORDER — INSULIN ASPART 100 UNIT/ML IJ SOLN
0.0000 [IU] | Freq: Three times a day (TID) | INTRAMUSCULAR | Status: DC
  Administered 2021-09-14 – 2021-09-15 (×3): 8 [IU] via SUBCUTANEOUS
  Administered 2021-09-15: 3 [IU] via SUBCUTANEOUS
  Administered 2021-09-16: 5 [IU] via SUBCUTANEOUS
  Administered 2021-09-16: 3 [IU] via SUBCUTANEOUS
  Administered 2021-09-16 – 2021-09-17 (×2): 2 [IU] via SUBCUTANEOUS
  Administered 2021-09-18 (×2): 3 [IU] via SUBCUTANEOUS
  Administered 2021-09-18: 2 [IU] via SUBCUTANEOUS
  Administered 2021-09-19: 3 [IU] via SUBCUTANEOUS

## 2021-09-14 MED ORDER — LIDOCAINE 5 % EX PTCH
1.0000 | MEDICATED_PATCH | CUTANEOUS | Status: DC
  Administered 2021-09-14 – 2021-09-15 (×2): 1 via TRANSDERMAL
  Filled 2021-09-14 (×3): qty 1

## 2021-09-14 MED ORDER — ISOSORB DINITRATE-HYDRALAZINE 20-37.5 MG PO TABS
2.0000 | ORAL_TABLET | Freq: Three times a day (TID) | ORAL | Status: DC
  Administered 2021-09-14 – 2021-09-19 (×14): 2 via ORAL
  Filled 2021-09-14 (×17): qty 2

## 2021-09-14 MED ORDER — LACTATED RINGERS IV BOLUS
1000.0000 mL | Freq: Once | INTRAVENOUS | Status: AC
  Administered 2021-09-14: 1000 mL via INTRAVENOUS

## 2021-09-14 MED ORDER — VANCOMYCIN HCL 1750 MG/350ML IV SOLN
1750.0000 mg | INTRAVENOUS | Status: DC
  Administered 2021-09-15: 1750 mg via INTRAVENOUS
  Filled 2021-09-14: qty 350

## 2021-09-14 MED ORDER — ACETAMINOPHEN 500 MG PO TABS
1000.0000 mg | ORAL_TABLET | Freq: Three times a day (TID) | ORAL | Status: DC
  Administered 2021-09-15 – 2021-09-19 (×12): 1000 mg via ORAL
  Filled 2021-09-14 (×11): qty 2

## 2021-09-14 MED ORDER — ACETAMINOPHEN 500 MG PO TABS
1000.0000 mg | ORAL_TABLET | Freq: Three times a day (TID) | ORAL | Status: DC
  Administered 2021-09-14: 1000 mg via ORAL
  Filled 2021-09-14 (×2): qty 2

## 2021-09-14 MED ORDER — MELATONIN 5 MG PO TABS
5.0000 mg | ORAL_TABLET | Freq: Once | ORAL | Status: AC
  Administered 2021-09-14: 5 mg via ORAL
  Filled 2021-09-14: qty 1

## 2021-09-14 MED ORDER — METOPROLOL SUCCINATE ER 100 MG PO TB24
100.0000 mg | ORAL_TABLET | Freq: Every day | ORAL | Status: DC
  Administered 2021-09-15 – 2021-09-19 (×5): 100 mg via ORAL
  Filled 2021-09-14 (×5): qty 1

## 2021-09-14 MED ORDER — SODIUM CHLORIDE 0.9 % IV SOLN
3.0000 g | Freq: Four times a day (QID) | INTRAVENOUS | Status: DC
  Administered 2021-09-14 – 2021-09-19 (×20): 3 g via INTRAVENOUS
  Filled 2021-09-14 (×22): qty 8

## 2021-09-14 MED ORDER — EMPAGLIFLOZIN 10 MG PO TABS
10.0000 mg | ORAL_TABLET | Freq: Every day | ORAL | Status: DC
  Administered 2021-09-14 – 2021-09-19 (×6): 10 mg via ORAL
  Filled 2021-09-14 (×6): qty 1

## 2021-09-14 MED ORDER — VANCOMYCIN HCL IN DEXTROSE 1-5 GM/200ML-% IV SOLN
1000.0000 mg | Freq: Once | INTRAVENOUS | Status: AC
  Administered 2021-09-14: 1000 mg via INTRAVENOUS
  Filled 2021-09-14: qty 200

## 2021-09-14 MED ORDER — OXYCODONE HCL 5 MG PO TABS
5.0000 mg | ORAL_TABLET | ORAL | Status: DC | PRN
  Administered 2021-09-15 – 2021-09-16 (×5): 5 mg via ORAL
  Filled 2021-09-14 (×5): qty 1

## 2021-09-14 MED ORDER — METHOCARBAMOL 1000 MG/10ML IJ SOLN
500.0000 mg | Freq: Four times a day (QID) | INTRAVENOUS | Status: DC
  Administered 2021-09-14 – 2021-09-15 (×2): 500 mg via INTRAVENOUS
  Filled 2021-09-14 (×2): qty 5

## 2021-09-14 NOTE — Progress Notes (Addendum)
Pharmacy Antibiotic Note  Randy [name] is a 51 y.o. male admitted on 09/14/2021 with  diabetic R foot infection .  Pharmacy has been consulted for Unasyn and Vancomycin dosing. Vanc 1gm given in ED  SCr 2.4 (baseline ~2)  Plan: Unasyn 3g IV q6h Vanc 1.5gm IV now in addition to 1gm already given to equal 2.5gm loading dose then Vancomycin 1750 mg IV Q 24 hrs. Goal AUC 400-550. Expected AUC: 500, SCr used: 2.4, Vd coeff 0.5 Will f/u renal function, micro data, and pt's clinical condition Vanc levels prn - consider at Css in obese pt     Temp (24hrs), Avg:99.5 F (37.5 C), Min:99.5 F (37.5 C), Max:99.5 F (37.5 C)  Recent Labs  Lab 09/14/21 1340 09/14/21 1545  WBC 18.0*  --   CREATININE 2.40*  --   LATICACIDVEN 1.0 1.0    Estimated Creatinine Clearance: 69.2 mL/min (A) (by C-G formula based on SCr of 2.4 mg/dL (H)).    Allergies  Allergen Reactions   Fish-Derived Products Anaphylaxis    Antimicrobials this admission: 5/21 Vanc >> 5/21 Unasyn >>   Microbiology results: 5/21 BCx:   Thank you for allowing pharmacy to be a part of this patient's care.  Christoper Fabian, PharmD, BCPS Please see amion for complete clinical pharmacist phone list 09/14/2021 4:59 PM

## 2021-09-14 NOTE — ED Notes (Signed)
Patient transported to X-ray 

## 2021-09-14 NOTE — ED Provider Notes (Signed)
[facility] EMERGENCY DEPARTMENT Provider Note   CSN: [id number] Arrival date & time: 09/14/21  1303     History  Chief Complaint  Patient presents with   Right calf pain    Randy [name] is a 51 y.o. male with history of hypertension, diabetes mellitus, CKD stage III, systolic CHF, morbid obesity.  Presents emergency department complaint of right calf pain and wound to right great toe.  Patient reports that approximately 3 weeks prior he actually cut himself on the bottom of his right great toe while moving some things.  Patient reports that he has been taking care of this at home, has been applying hydrogen peroxide on a regular basis until he was seen at urgent care on 5/16 and advised to stop.  Patient was started on Augmentin for concern of wound infection at that time.  Patient reports that he has developed swelling and redness over the last few days.    Patient reports that he has had pain to right calf over the last week.  Denies any traumatic falls or injuries.  Pain has been constant.  Pain is worse when standing.  Patient has tried Tylenol with no relief of symptoms.  At present patient rates pain 10/10 on the pain scale.  Patient denies any fever, chills, chest pain, shortness of breath, hemoptysis, numbness, weakness, pallor.    HPI     Home Medications Prior to Admission medications   Medication Sig Start Date End Date Taking? Authorizing Provider  amiodarone (PACERONE) 200 MG tablet Take 1 tablet (200 mg total) by mouth 2 (two) times daily. 07/02/21   Bensimhon, Shaune Pascal, MD  amoxicillin-clavulanate (AUGMENTIN) 875-125 MG tablet Take 1 tablet by mouth every 12 (twelve) hours. 09/09/21   Vanessa Kick, MD  apixaban (ELIQUIS) 5 MG TABS tablet Take 1 tablet (5 mg total) by mouth 2 (two) times daily. Patient taking differently: Take 2,030 mg by mouth 2 (two) times daily. 04/16/21   Clegg, Amy D, NP  atorvastatin (LIPITOR) 20 MG tablet Take 1 tablet (20 mg  total) by mouth at bedtime. 04/16/21   Clegg, Amy D, NP  empagliflozin (JARDIANCE) 10 MG TABS tablet Take 1 tablet (10 mg total) by mouth daily. Patient taking differently: Take 2,030 mg by mouth daily. 04/16/21   Clegg, Amy D, NP  furosemide (LASIX) 80 MG tablet Take 1 tablet (80 mg total) by mouth daily. 07/02/21   Bensimhon, Shaune Pascal, MD  glimepiride (AMARYL) 4 MG tablet Take 4 mg by mouth daily with breakfast.    [provider]  latanoprost (XALATAN) 0.005 % ophthalmic solution Place 1 drop into both eyes at bedtime. Patient not taking: Reported on 07/02/2021 01/12/21   [provider]  metolazone (ZAROXOLYN) 2.5 MG tablet Take 1 tablet (2.5 mg total) by mouth as directed. 07/02/21 09/30/21  Bensimhon, Shaune Pascal, MD  metoprolol succinate (TOPROL-XL) 100 MG 24 hr tablet Take 1 tablet (100 mg total) by mouth daily. Take with or immediately following a meal. 04/16/21 07/15/21  Clegg, Amy D, NP  mupirocin ointment (BACTROBAN) 2 % Apply 1 application. topically 2 (two) times daily. 09/09/21   Vanessa Kick, MD  potassium chloride SA (KLOR-CON M20) 20 MEQ tablet TAKE 2 TABLETS BY MOUTH ONLY WHEN TAKING METOLAZONE 09/01/21   Bensimhon, Shaune Pascal, MD  predniSONE (DELTASONE) 20 MG tablet Take 2 tablets (40 mg total) by mouth daily. 09/09/21   Vanessa Kick, MD  sacubitril-valsartan (ENTRESTO) 97-103 MG Take 1 tablet by mouth 2 (two)  times daily. 04/16/21   Clegg, Amy D, NP  spironolactone (ALDACTONE) 25 MG tablet Take 1 tablet (25 mg total) by mouth daily. 04/16/21 07/15/21  Darrick Grinder D, NP      Allergies    Fish-derived products    Review of Systems   Review of Systems  Constitutional:  Negative for chills and fever.  Eyes:  Negative for visual disturbance.  Respiratory:  Negative for shortness of breath.   Cardiovascular:  Negative for chest pain.  Gastrointestinal:  Negative for abdominal pain, nausea and vomiting.  Musculoskeletal:  Positive for myalgias. Negative for back pain and neck  pain.  Skin:  Positive for color change and wound. Negative for pallor and rash.  Neurological:  Negative for dizziness, syncope, weakness, light-headedness, numbness and headaches.  Psychiatric/Behavioral:  Negative for confusion.    Physical Exam Updated Vital Signs BP 120/74 (BP Location: Left Arm)   Pulse 72   Temp 99.5 F (37.5 C) (Oral)   Resp 18   SpO2 95%  Physical Exam Vitals and nursing note reviewed.  Constitutional:      General: He is not in acute distress.    Appearance: He is not ill-appearing, toxic-appearing or diaphoretic.  HENT:     Head: Normocephalic.  Eyes:     General: No scleral icterus.       Right eye: No discharge.        Left eye: No discharge.  Cardiovascular:     Rate and Rhythm: Normal rate.  Pulmonary:     Effort: Pulmonary effort is normal.  Musculoskeletal:     Right knee: No swelling, deformity, effusion, erythema, ecchymosis, lacerations, bony tenderness or crepitus. Normal range of motion. No tenderness. Normal alignment.     Left knee: No swelling, deformity, effusion, erythema, ecchymosis, lacerations, bony tenderness or crepitus. Normal range of motion. No tenderness. Normal alignment.     Right lower leg: Swelling and tenderness present. No deformity, lacerations or bony tenderness. No edema.     Left lower leg: No swelling, deformity, lacerations, tenderness or bony tenderness. No edema.     Right ankle: Swelling present. No deformity, ecchymosis or lacerations. No tenderness. Normal range of motion. Anterior drawer test negative.     Left ankle: No swelling, deformity, ecchymosis or lacerations. No tenderness. Normal range of motion. Anterior drawer test negative.     Right foot: Normal range of motion and normal capillary refill. Swelling, laceration and tenderness present. No bony tenderness or crepitus. Normal pulse.     Left foot: Normal range of motion and normal capillary refill. No swelling, deformity, laceration, tenderness, bony  tenderness or crepitus. Normal pulse.     Comments: Swelling and tenderness to right calf.  No erythema or warmth to patient's right calf.  Laceration to sole of right foot as pictured below.  There is swelling, erythema, and warmth to dorsum of right foot.  Contusion and fluctuance to medial aspect of right foot just below right great toe.  Patient has tenderness to right great toe.  Skin:    General: Skin is warm and dry.  Neurological:     General: No focal deficit present.     Mental Status: He is alert.  Psychiatric:        Behavior: Behavior is cooperative.           ED Results / Procedures / Treatments   Labs (all labs ordered are listed, but only abnormal results are displayed) Labs Reviewed  COMPREHENSIVE METABOLIC PANEL - Abnormal;  Notable for the following components:      Result Value   Glucose, Bld 261 (*)    BUN 26 (*)    Creatinine, Ser 2.40 (*)    Albumin 2.9 (*)    AST 14 (*)    GFR, Estimated 32 (*)    All other components within normal limits  CBC WITH DIFFERENTIAL/PLATELET - Abnormal; Notable for the following components:   WBC 18.0 (*)    Neutro Abs 14.1 (*)    Monocytes Absolute 1.5 (*)    Abs Immature Granulocytes 0.10 (*)    All other components within normal limits  CULTURE, BLOOD (ROUTINE X 2)  CULTURE, BLOOD (ROUTINE X 2)  LACTIC ACID, PLASMA  LACTIC ACID, PLASMA    EKG None  Radiology DG Foot Complete Right  Result Date: 09/14/2021 CLINICAL DATA:  Wound involving the first digit of the right foot. EXAM: RIGHT FOOT COMPLETE - 3+ VIEW COMPARISON:  Ankle radiographs dated 09/28/2011. FINDINGS: There is no evidence of fracture or dislocation. A posterior calcaneal enthesophyte is noted. No focal osseous demineralization to suggest osteomyelitis. There is soft tissue swelling and potentially a soft tissue defect/laceration surrounding the first digit. No radiopaque foreign body. IMPRESSION: No focal osseous demineralization to suggest  osteomyelitis. No radiopaque foreign body. Electronically Signed   By: Zerita Boers M.D.   On: 09/14/2021 14:57    Procedures Procedures    Medications Ordered in ED Medications  vancomycin (VANCOCIN) IVPB 1000 mg/200 mL premix (has no administration in time range)  lactated ringers bolus 1,000 mL (has no administration in time range)  oxyCODONE-acetaminophen (PERCOCET/ROXICET) 5-325 MG per tablet 1 tablet (has no administration in time range)    ED Course/ Medical Decision Making/ A&P Clinical Course as of 09/14/21 1537  Sun Sep 14, 2021  1456 I spoke with resident of teaching service who states that their team will come to see the patient for admission. [PB]    Clinical Course User Index [PB] Loni Beckwith, PA-C                           Medical Decision Making Amount and/or Complexity of Data Reviewed Labs: ordered. Radiology: ordered.  Risk Prescription drug management. Decision regarding hospitalization.   Alert 51 year old male in no acute distress, nontoxic-appearing.  Presents to the ED with a chief complaint of right calf pain and foot infection to right foot.  Information obtained from patient.  Past medical records were reviewed including previous provider notes, labs, and imaging.  Patient has medical history as outlined in HPI which complicates his care.  Due to reports of calf pain with noted swelling and tenderness on exam concern for possible DVT.  Will obtain ultrasound imaging for further evaluation.  Concern for diabetic foot infection with cellulitis.  Will obtain x-ray imaging to evaluate for any bony involvement.  Will obtain CMP, CBC, and lactic acid.    I personally viewed and interpreted patient's lab results.  Pertinent findings include: -Leukocytosis 18.0 -Creatinine 2.40, appears above patient's baseline of 1.8-2 -Lactic acid 1.0  Will add blood cultures and start vancomycin for purulent cellulitis.  Patient given 1 L fluid bolus due to  increased creatinine; he does have history of CHF however last EF performed in 3/23 showed EF 55%.  Patient will need admission for IV antibiotics, does not meet criteria for sepsis at this time.  I personally viewed and interpreted patient's x-ray imaging.  Agree with radiology interpretation  of no signs of focal osseous demineralization to suggest osteomyelitis.  Ultrasound imaging shows no signs of DVT to right lower extremity.           Final Clinical Impression(s) / ED Diagnoses Final diagnoses:  Cellulitis of right lower extremity    Rx / DC Orders ED Discharge Orders     None         Loni Beckwith, PA-C 09/14/21 1559    Valarie Merino, MD 09/17/21 [phone]

## 2021-09-14 NOTE — Progress Notes (Signed)
VASCULAR LAB    Right lower extremity venous duplex has been performed.  See CV proc for preliminary results.  Gave verbal report to Dr. Alfredo Martinez, [facility], RVT 09/14/2021, 3:36 PM

## 2021-09-14 NOTE — ED Notes (Signed)
Patient transported to floor bed

## 2021-09-14 NOTE — ED Notes (Signed)
Patient transported to Vascular 

## 2021-09-14 NOTE — H&P (Signed)
Date: 09/14/2021               Patient Name:  Randy [name] MRN: [id number]  DOB: 1970/06/15 Age / Sex: 51 y.o., male   PCP: [facility] Service: Internal Medicine Teaching Service         Attending Physician: Dr. Johnnye Sima, Doroteo Bradford, MD    First Contact: Dr. Lorin Glass Pager: [phone]  Second Contact: Dr. Eulas Post Pager: [phone]       After Hours (After 5p/  First Contact Pager: [phone]  weekends / holidays): Second Contact Pager: [phone]   Chief Complaint: right great toe wound and right calf pain.  History of Present Illness: Randy [name] is a 51 y.o. male with a PMHx of hypertension, diabetes mellitus, CKD stage III, systolic CHF, morbid obesity who presents here today with a chief complaint of a right great toe wound and right calf pain.  He states that, initially, he hurt his right great toe a few weeks ago when he was moving some furniture.  He thinks he cut it but is unsure exactly what happened.  Used peroxide for it until he presented to urgent care on 5/16, and at that time, he was placed on augmentin and steroid regimens with recommendation for close PCP follow-up. Since then, his right great toe pain has improved, however now he complains of a constant, cramping pain in his right calf that he rates as a 10/10.  It is located on the medial aspect of the calf but nowhere else.  Also redness and swelling in the area.  He thinks he may have strained a muscle but is not sure. He has tried ice and heating pads for the leg pain which has improved the symptoms.  He tried Tylenol earlier but states this does not alleviate his pain.  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  Was prompted to present to the ED today because he noticed a blood blister on his right great toe earlier today.  The right great toe is not bothering him, and he states multiple times that his main  concern is his right calf pain. Denies any prior injuries. Denies any fevers/chills. Denies CP, SOB, abdominal pain, dysuria, hematuria, difficulty with BM. No other complaints or concerns at this time.   Meds:  Amiodarone 200mg  two times a day  Eliquis 5 mg daily Atorvastatin 20mg   Jardiance 10mg  qd  Glimepiride 4mg  qd Metoprolol 100mg  qd  Entresto 97-103mg  BID  Aldactone 25mg   S/p 6 day course of prednisone recently  Allergies: Allergies as of 09/14/2021 - Review Complete 09/14/2021  Allergen Reaction Noted   Fish-derived products Anaphylaxis 11/28/2010   Past Medical History:  Diagnosis Date   CKD (chronic kidney disease) stage 3, GFR 30-59 ml/min (HCC)    baseline Cr 1.5-1.7   Decreased visual acuity    Left eye, resolved - hypertensive retinopathy   Diabetes mellitus 2012   dx hospitalization with A1c 6.6%   Hilar adenopathy    on CT scan 02/2010, on rpt scan stable/improved.   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 Social drinker. No tobacco use. No illicit drug use. Runs and lifts weights for exercise.  Review of Systems: A complete ROS was negative except as per HPI.   Physical Exam: Blood pressure 120/74, pulse 72, temperature 99.5 F (37.5 C),  temperature source Oral, resp. rate 18, SpO2 95 %. General: NAD, nl appearance HE: Normocephalic, atraumatic, EOMI, Conjunctivae normal ENT: No congestion, no rhinorrhea, no exudate or erythema  Cardiovascular: Normal rate, regular rhythm. No murmurs, rubs, or gallops Pulmonary: Effort normal, breath sounds normal. No wheezes, rales, or rhonchi Abdominal: soft, nontender, bowel sounds present Musculoskeletal: no swelling, deformity, injury.  Tenderness to light touch along the medial aspect of the RLE. Mild erythema and swelling of that area. NVI.  Skin: Warm, dry. There is swelling, erythema, and warmth of the right foot. Fluctuance is noted along the ventral aspect of the right great toe with no TTP. Contusion is noted along the dorsal aspect of the right great toe. See media tab for photos. Psychiatric/Behavioral: normal mood, normal behavior     EKG: personally reviewed my interpretation is pending  Right foot films:  IMPRESSION: No focal osseous demineralization to suggest osteomyelitis. No radiopaque foreign body.  Assessment & Plan by Problem: Principal Problem:   Leg pain, medial, right  #Persistent medial right lower leg pain The patient presents here with about a 1 week history of pain along the medial aspect of the right calf.  Prior to this, he injured his great toe for which he was seen at an urgent care, and at which time he was prescribed Augmentin and steroids.  The patient states that his great toe/foot has been improving since starting antibiotics, but he was concerned about his calf pain prompting him to come here.  He has been afebrile and hemodynamically stable since admit.  On exam, there is swelling and mild erythema along the medial aspect of the RLE, but otherwise he is NVI with no warmth of the leg.  Most likely sustained a muscle injury (maybe from work or from going to the gym); possibly injured the leg from compensating for his toe injury (see below). Less likely  DVT given stable vital signs, endorsement of taking eliquis regularly, and unremarkable DVT study today. Less likely related to his right great toe given clinical improvement after starting abx recently and exam findings are less concerning for a cellulitis of the leg. Given persistent toe pain, will tx with regimen as below. -Continue oxycodone 5 mg q4h PRN -Continue IV robaxin 500 mg @ 100 mL/hr q6h for 4 doses -Tylenol PRN  #Cellulitis of the right great toe #Foot ulcer The patient presented here with main complaint of right calf pain, however this is in the setting of recently injuring his right great toe. He endorses improvement in the right great toe pain/swelling since  starting Augmentin on 5/16.  On arrival here, patient is less concerned about his right great toe. He is afebrile and HDS but does have a leukocytosis of 18. Lactic acid WNL. Foot x-rays showed no evidence of osteomyelitis. Most consistent with a foot ulcer and cellulitis in the setting of T2DM. He was started on IV abx in the ED and will otherwise consult wound care for their recommendations while he is here.  -Continue IV vancomycin and Unasyn, will likely transition to po tomorrow -WOC consult, appreciate recommendations -Blood cultures pending -Follow-up lactic acid levels  #AKI on CKD stage IIIb Baseline creatinine of 1.7-1.8. Follows with nephrology in [place]. Cr on admit here was 2.40. Pt denies any trouble eating or drinking lately. Differential includes prerenal vs progression of his CKD. Pt appears euvolemic on exam. -s/p 1L fluid challenge in the ED -Trend BMP -Avoid nephrotoxic medications  #T2DM Most recent A1c of 6.3 in October 2022. He is on glimepiride and jardiance at home.  -Continue jardiance 10 mg daily -SSI with meals -CBG monitoring -Repeat A1c pending  #HTN The patient is on Entresto, spiro, and metoprolol at home. Pt has been normotensive with BP in the 120s on admit.  -Continue home  metoprolol  #Systolic CHF Followed by Dr. Haroldine Laws. Last echo from March of this year showed EF 55%. Pt states he has not been on any diuretics at home but does take entresto, spiro, metoprolol, and jardiance. Patient appears euvolemic on exam today. -Continue home metoprolol -Continue home jardiance -Strict I/O's -Daily weights -Trend BMP  #Paroxysmal atrial fibrillation He is on amiodarone and metoprolol for rate control and on eliquis for anticoagulation. He underwent DCCV in October 2022. -Continue home regimen  #HLD The patient is on atorvastatin 20 mg daily at home. -Continue home regimen  Dispo: Admit patient to Observation with expected length of stay less than 2 midnights.  Signed: Orvis Brill, MD 09/14/2021, 4:52 PM  Pager: [phone]  After 5pm on weekdays and 1pm on weekends: On Call pager: [phone]

## 2021-09-14 NOTE — ED Triage Notes (Signed)
Patient was seen at Urgent Care and told he had muscle strain, states pain has gotten worse.

## 2021-09-15 ENCOUNTER — Inpatient Hospital Stay (HOSPITAL_COMMUNITY): Payer: 59

## 2021-09-15 DIAGNOSIS — E1140 Type 2 diabetes mellitus with diabetic neuropathy, unspecified: Secondary | ICD-10-CM | POA: Diagnosis present

## 2021-09-15 DIAGNOSIS — Z7985 Long-term (current) use of injectable non-insulin antidiabetic drugs: Secondary | ICD-10-CM | POA: Diagnosis not present

## 2021-09-15 DIAGNOSIS — M869 Osteomyelitis, unspecified: Secondary | ICD-10-CM | POA: Diagnosis present

## 2021-09-15 DIAGNOSIS — E1165 Type 2 diabetes mellitus with hyperglycemia: Secondary | ICD-10-CM | POA: Diagnosis present

## 2021-09-15 DIAGNOSIS — Z79899 Other long term (current) drug therapy: Secondary | ICD-10-CM | POA: Diagnosis not present

## 2021-09-15 DIAGNOSIS — E11621 Type 2 diabetes mellitus with foot ulcer: Secondary | ICD-10-CM | POA: Diagnosis present

## 2021-09-15 DIAGNOSIS — I480 Paroxysmal atrial fibrillation: Secondary | ICD-10-CM | POA: Diagnosis not present

## 2021-09-15 DIAGNOSIS — L03115 Cellulitis of right lower limb: Secondary | ICD-10-CM | POA: Diagnosis present

## 2021-09-15 DIAGNOSIS — I4821 Permanent atrial fibrillation: Secondary | ICD-10-CM | POA: Diagnosis present

## 2021-09-15 DIAGNOSIS — M79604 Pain in right leg: Secondary | ICD-10-CM | POA: Diagnosis not present

## 2021-09-15 DIAGNOSIS — Z6841 Body Mass Index (BMI) 40.0 and over, adult: Secondary | ICD-10-CM | POA: Diagnosis not present

## 2021-09-15 DIAGNOSIS — E1169 Type 2 diabetes mellitus with other specified complication: Secondary | ICD-10-CM | POA: Diagnosis present

## 2021-09-15 DIAGNOSIS — E785 Hyperlipidemia, unspecified: Secondary | ICD-10-CM | POA: Diagnosis present

## 2021-09-15 DIAGNOSIS — L03031 Cellulitis of right toe: Secondary | ICD-10-CM | POA: Diagnosis present

## 2021-09-15 DIAGNOSIS — E11628 Type 2 diabetes mellitus with other skin complications: Secondary | ICD-10-CM | POA: Diagnosis present

## 2021-09-15 DIAGNOSIS — L02611 Cutaneous abscess of right foot: Secondary | ICD-10-CM | POA: Diagnosis present

## 2021-09-15 DIAGNOSIS — Z91013 Allergy to seafood: Secondary | ICD-10-CM | POA: Diagnosis not present

## 2021-09-15 DIAGNOSIS — L97519 Non-pressure chronic ulcer of other part of right foot with unspecified severity: Secondary | ICD-10-CM | POA: Diagnosis present

## 2021-09-15 DIAGNOSIS — E1122 Type 2 diabetes mellitus with diabetic chronic kidney disease: Secondary | ICD-10-CM | POA: Diagnosis present

## 2021-09-15 DIAGNOSIS — N1832 Chronic kidney disease, stage 3b: Secondary | ICD-10-CM | POA: Diagnosis present

## 2021-09-15 DIAGNOSIS — I428 Other cardiomyopathies: Secondary | ICD-10-CM | POA: Diagnosis present

## 2021-09-15 DIAGNOSIS — N179 Acute kidney failure, unspecified: Secondary | ICD-10-CM | POA: Diagnosis present

## 2021-09-15 DIAGNOSIS — M729 Fibroblastic disorder, unspecified: Secondary | ICD-10-CM | POA: Diagnosis present

## 2021-09-15 DIAGNOSIS — I5022 Chronic systolic (congestive) heart failure: Secondary | ICD-10-CM | POA: Diagnosis present

## 2021-09-15 DIAGNOSIS — I130 Hypertensive heart and chronic kidney disease with heart failure and stage 1 through stage 4 chronic kidney disease, or unspecified chronic kidney disease: Secondary | ICD-10-CM | POA: Diagnosis present

## 2021-09-15 LAB — CBC WITH DIFFERENTIAL/PLATELET
Abs Immature Granulocytes: 0.09 10*3/uL — ABNORMAL HIGH (ref 0.00–0.07)
Basophils Absolute: 0.1 10*3/uL (ref 0.0–0.1)
Basophils Relative: 0 %
Eosinophils Absolute: 0 10*3/uL (ref 0.0–0.5)
Eosinophils Relative: 0 %
HCT: 42.1 % (ref 39.0–52.0)
Hemoglobin: 13.2 g/dL (ref 13.0–17.0)
Immature Granulocytes: 0 %
Lymphocytes Relative: 18 %
Lymphs Abs: 3.6 10*3/uL (ref 0.7–4.0)
MCH: 26.2 pg (ref 26.0–34.0)
MCHC: 31.4 g/dL (ref 30.0–36.0)
MCV: 83.5 fL (ref 80.0–100.0)
Monocytes Absolute: 2 10*3/uL — ABNORMAL HIGH (ref 0.1–1.0)
Monocytes Relative: 10 %
Neutro Abs: 14.6 10*3/uL — ABNORMAL HIGH (ref 1.7–7.7)
Neutrophils Relative %: 72 %
Platelets: 322 10*3/uL (ref 150–400)
RBC: 5.04 MIL/uL (ref 4.22–5.81)
RDW: 15.3 % (ref 11.5–15.5)
WBC: 20.3 10*3/uL — ABNORMAL HIGH (ref 4.0–10.5)
nRBC: 0 % (ref 0.0–0.2)

## 2021-09-15 LAB — COMPREHENSIVE METABOLIC PANEL
ALT: 16 U/L (ref 0–44)
AST: 14 U/L — ABNORMAL LOW (ref 15–41)
Albumin: 2.7 g/dL — ABNORMAL LOW (ref 3.5–5.0)
Alkaline Phosphatase: 52 U/L (ref 38–126)
Anion gap: 9 (ref 5–15)
BUN: 28 mg/dL — ABNORMAL HIGH (ref 6–20)
CO2: 25 mmol/L (ref 22–32)
Calcium: 8.8 mg/dL — ABNORMAL LOW (ref 8.9–10.3)
Chloride: 104 mmol/L (ref 98–111)
Creatinine, Ser: 2.41 mg/dL — ABNORMAL HIGH (ref 0.61–1.24)
GFR, Estimated: 32 mL/min — ABNORMAL LOW (ref >60)
Glucose, Bld: 219 mg/dL — ABNORMAL HIGH (ref 70–99)
Potassium: 4 mmol/L (ref 3.5–5.1)
Sodium: 138 mmol/L (ref 135–145)
Total Bilirubin: 0.7 mg/dL (ref 0.3–1.2)
Total Protein: 6.5 g/dL (ref 6.5–8.1)

## 2021-09-15 LAB — SODIUM, URINE, RANDOM: Sodium, Ur: 49 mmol/L

## 2021-09-15 LAB — CREATININE, URINE, RANDOM: Creatinine, Urine: 75.56 mg/dL

## 2021-09-15 LAB — GLUCOSE, CAPILLARY
Glucose-Capillary: 265 mg/dL — ABNORMAL HIGH (ref 70–99)
Glucose-Capillary: 291 mg/dL — ABNORMAL HIGH (ref 70–99)
Glucose-Capillary: 189 mg/dL — ABNORMAL HIGH (ref 70–99)
Glucose-Capillary: 184 mg/dL — ABNORMAL HIGH (ref 70–99)

## 2021-09-15 MED ORDER — INSULIN GLARGINE-YFGN 100 UNIT/ML ~~LOC~~ SOLN
12.0000 [IU] | Freq: Every day | SUBCUTANEOUS | Status: DC
  Administered 2021-09-15 – 2021-09-18 (×4): 12 [IU] via SUBCUTANEOUS
  Filled 2021-09-15 (×7): qty 0.12

## 2021-09-15 MED ORDER — METHOCARBAMOL 750 MG PO TABS
750.0000 mg | ORAL_TABLET | Freq: Four times a day (QID) | ORAL | Status: AC | PRN
  Administered 2021-09-15 – 2021-09-16 (×2): 750 mg via ORAL
  Filled 2021-09-15 (×2): qty 1

## 2021-09-15 MED ORDER — METHOCARBAMOL 500 MG PO TABS: 500.0000 mg | ORAL_TABLET | Freq: Four times a day (QID) | ORAL | Status: DC | PRN

## 2021-09-15 MED ORDER — COLLAGENASE 250 UNIT/GM EX OINT
TOPICAL_OINTMENT | Freq: Every day | CUTANEOUS | Status: DC
  Filled 2021-09-15: qty 30

## 2021-09-15 MED ORDER — INSULIN GLARGINE-YFGN 100 UNIT/ML ~~LOC~~ SOLN: 10.0000 [IU] | Freq: Every day | SUBCUTANEOUS | Status: DC

## 2021-09-15 MED ORDER — LORAZEPAM 1 MG PO TABS
1.0000 mg | ORAL_TABLET | Freq: Once | ORAL | Status: AC
  Administered 2021-09-15: 1 mg via ORAL
  Filled 2021-09-15: qty 1

## 2021-09-15 NOTE — Progress Notes (Signed)
Subjective:  ON: NAE  The patient was seen at bedside during rounds this AM. Reports that the pain in his right calf is still 10/10. States that opioids and Robaxin have not helped to alleviate his pain. Continues to state that right great toe is not his main concern today and that he wants to focus on addressing his right leg. No other complaints or concerns today.  Objective:  Vital signs in last 24 hours: Vitals:   09/14/21 1845 09/14/21 2050 09/15/21 0010 09/15/21 0440  BP: 125/80 129/75 (!) 109/57 (!) 147/92  Pulse: 60 63 62 65  Resp: 17 20 18 18   Temp: 98.4 F (36.9 C) 98 F (36.7 C) 98.2 F (36.8 C) 98.4 F (36.9 C)  TempSrc: Oral Oral Oral Oral  SpO2: 97% 95% 97% 94%   General: NAD, nl appearance HE: Normocephalic, atraumatic, EOMI, Conjunctivae normal ENT: No congestion, no rhinorrhea, no exudate or erythema  Cardiovascular: Normal rate, regular rhythm. No murmurs, rubs, or gallops Pulmonary: Effort normal, breath sounds normal. No wheezes, rales, or rhonchi Abdominal: soft, nontender, bowel sounds present Musculoskeletal: no swelling, deformity, injury.  No obvious TTP along the medial aspect of the RLE. NVI. Skin: Warm, dry. There is swelling, erythema, and warmth of the right foot. Fluctuance is noted along the ventral aspect of the right great toe with no TTP. Contusion is noted along the dorsal aspect of the right great toe. See media tab for photos. Psychiatric/Behavioral: normal mood, normal behavior     Assessment/Plan:  Principal Problem:   Leg pain, medial, right  #Persistent medial right lower leg pain The patient continues to endorse right lower leg pain. Workup so far is negative for DVT. Pt could have sustained a muscle injury (maybe from work or from going to the gym); possibly injured the leg from compensating for his toe injury (see below). Given persistent pain, will adjust his pain medication as below and will obtain an MRI for further  evaluation. -MRI of the right tib/fib is pending -Continue oxycodone 5 mg q4h PRN -Increased robaxin to 750 mg q6h PRN -Tylenol PRN  #Cellulitis of the right great toe #Foot ulcer Pt was recently seen by UC at which time he was started on augmentin. Pt endorses improvement in the right great toe pain/swelling since then. Pt is currently afebrile and HDS with a WBC of 20.3. Blood cultures show NGTD. Foot x-rays showed no evidence of osteomyelitis. Most consistent with a foot ulcer and cellulitis in the setting of T2DM. Will obtain a R foot MRI to r/o osteomyelitis.  -Continue IV vancomycin and Unasyn pending MRI results -WOC consult, appreciate recommendations  #AKI on CKD stage IIIb Baseline creatinine of 1.7-2. Follows with nephrology in [place]. Cr on admit here was 2.40, is 2.41 most recently. Pt denies any trouble eating or drinking lately; creatinine has remained stable after fluid challenge in the ED yesterday. Pt appears euvolemic on exam. -Urine studies are pending -Trend BMP -Avoid nephrotoxic medications  #T2DM Most recent A1c of 6.3 in October 2022. He is on glimepiride and jardiance at home. Glucose levels were elevated in the setting of dextrose in his IV robaxin. -Switched to po robaxin today -Continue jardiance 10 mg daily -SSI with meals -CBG monitoring -Repeat A1c pending  #HTN The patient is on Entresto, bidil, spiro, and metoprolol at home. Pt has been normotensive for the majority of his admission with systolic BP mostly ranging in the 120s-130s. -Continue home metoprolol and bidil  #Systolic CHF Followed by  Dr. Haroldine Laws. Last echo from March of this year showed EF 55%. Pt states he has not been on any diuretics at home but does take entresto, spiro, metoprolol, and jardiance. Patient appears euvolemic on exam today. -Continue home metoprolol -Continue home jardiance -Strict I/O's -Daily weights -Trend BMP  #Paroxysmal atrial fibrillation He is on  amiodarone and metoprolol for rate control and on eliquis for anticoagulation. He underwent DCCV in October 2022 and last March 2023. -Continue home regimen  #HLD The patient is on atorvastatin 20 mg daily at home. -Continue home regimen  Prior to Admission Living Arrangement: Home Anticipated Discharge Location: Home Barriers to Discharge: Medical workup Dispo: Anticipated discharge in approximately 1-2 day(s).   Orvis Brill, MD 09/15/2021, 6:55 AM Pager: [phone]  After 5pm on weekdays and 1pm on weekends: On Call pager [phone]

## 2021-09-15 NOTE — Progress Notes (Addendum)
Inpatient Diabetes Program Recommendations  AACE/ADA: New Consensus Statement on Inpatient Glycemic Control (2015)  Target Ranges:  Prepandial:   less than 140 mg/dL      Peak postprandial:   less than 180 mg/dL (1-2 hours)      Critically ill patients:  140 - 180 mg/dL   Lab Results  Component Value Date   GLUCAP 265 (H) 09/15/2021   HGBA1C 10.1 (H) 09/14/2021    Review of Glycemic Control  Latest Reference Range & Units 07/04/21 07:17 09/14/21 16:47 09/14/21 21:56 09/15/21 08:31 09/15/21 11:54  Glucose-Capillary 70 - 99 mg/dL 275 (H) 170 (H) 017 (H) 265 (H) 291 (H)  (H): Data is abnormally high  Diabetes history: DM2 Outpatient Diabetes medications: Jardiance 10 mg QD, Glimepiride once daily (unsure of dose)  Current orders for Inpatient glycemic control: Novolog 0-15 units TID and 0-5 units QHS, Jardiance 10 mg QD  Might consider Semglee 18 units QD (0.1 units/kg)   Reviewed patient's current A1c of 10.1% (average BG of 243 mg/dL). Explained what a A1c is and what it measures. Also reviewed goal A1c with patient, importance of good glucose control @ home, and blood sugar goals.  His last A1C was 6.3% in October of 2022.  He does not drink any beverages with sugar, he limits CHO's.  He checks his CBG at home daily.  Was seeing PCP at [facility] and is going to establish with new PCP.  He has her information at home.   Discussed how infection can cause elevated CBGs/A1C.  Initial injury to toe was approximately 3 weeks ago.  Ordered LWWD.  Will continue to follow while inpatient.  Thank you, Dulce Sellar, MSN, CDCES Diabetes Coordinator Inpatient Diabetes Program [phone] (team pager from 8a-5p)

## 2021-09-15 NOTE — Consult Note (Addendum)
WOC Nurse Consult Note: Reason for Consult: Consult requested for right foot. X-ray did not indicate osteomyelitis and MRI is pending. If MRI indicates osteomyelitis, then please consult ortho service. Wound type: Right plantar foot with full thickness wound; 3X3X.2cm, dry and yellow, no odor, drainage, or fluctuance.  Dry raised cracked callous surrounding the wound.  Anterior inner right foot near great toe with dark red-purple blood blister; 5X4cm with intact skin, no open wound or drainage. Pt states he was using "white cream" prior to admission to assist with healing, but he does not recall the name of the product.  I will order Santyl to assist with removal of nonviable tissue.  Dressing procedure/placement/frequency: Topical treatment orders provided for bedside nurses to perform as follows: 1. Apply Santyl to right plantar foot wound Q day, then cover with moist gauze and tape.   2. If right foot blister ruptures, apply Vaseline gauze and 4X4 and kerlex and change Q day Please re-consult if further assistance is needed.  Thank-you,  Cammie Mcgee MSN, RN, CWOCN, [place], CNS [phone]

## 2021-09-15 NOTE — Evaluation (Signed)
Physical Therapy Evaluation  Patient Details Name: Randy [name] MRN: [id number] DOB: [birth year] Today's Date: 09/15/2021  History of Present Illness  Pt is a 51 y/o male who presents with R lower leg pain and a blister on his R great toe. Pt states that initially, he hurt his right great toe a few weeks ago when he was moving some furniture.  He thinks he cut it but is unsure exactly what happened.  Used peroxide for it until he presented to urgent care on 5/16, and at that time, he was placed on augmentin and steroid regimens with recommendation for close PCP follow-up. Since then, his right great toe pain has improved, however now he complains of a constant, cramping pain in his right medial lower leg that he rates as a 10/10.  Also redness and swelling in the area.  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 MRI pending. PMH significant for CKD III, DM, Bell's Palsy, HTN, L knee injury, CVA, systolic CHF.   Clinical Impression  Pt admitted with above diagnosis. Pt currently with functional limitations due to the deficits listed below (see PT Problem List). At the time of PT eval pt was able to perform transfers and ambulation with modified independence, and RW was helpful to off weight the RLE due to pain. Pt reports no pain in the toe, only in the R medial lower leg. Noted swelling and redness over the area that was tender to palpation. No ROM restrictions grossly. After ~2 minutes standing pt reports sudden improvement in pain and was able to ambulate around the room well. Will follow and continue to assess needs as MRI results are maintained and plan is confirmed. Acutely, pt will benefit from skilled PT to increase their independence and safety with mobility to allow discharge to the venue listed below.          Recommendations for follow up therapy are one component of a multi-disciplinary discharge planning  process, led by the attending physician.  Recommendations may be updated based on patient status, additional functional criteria and insurance authorization.  Follow Up Recommendations No PT follow up    Assistance Recommended at Discharge PRN  Patient can return home with the following  A little help with walking and/or transfers;Assist for transportation;Help with stairs or ramp for entrance    Equipment Recommendations Rolling walker (2 wheels)  Recommendations for Other Services       Functional Status Assessment Patient has had a recent decline in their functional status and demonstrates the ability to make significant improvements in function in a reasonable and predictable amount of time.     Precautions / Restrictions Precautions Precautions: Fall Precaution Comments: Very painful to light touch Restrictions Weight Bearing Restrictions: No      Mobility  Bed Mobility Overal bed mobility: Modified Independent Bed Mobility: Supine to Sit, Sit to Supine           General bed mobility comments: No assist required.    Transfers Overall transfer level: Modified independent Equipment used: None, Rolling walker (2 wheels)               General transfer comment: Initially without RW and then with. Pt able to stand with modified independence without UE support, however the walker was helpful to off weight the RLE. Pt with increased pain upon stand but after ~2 minutes of standing pt reports sudden improvement  and able to put more weight through the RLE.    Ambulation/Gait Ambulation/Gait assistance: Modified independent (Device/Increase time) Gait Distance (Feet): 30 Feet Assistive device: Rolling walker (2 wheels) Gait Pattern/deviations: Step-through pattern, Decreased stride length, Narrow base of support Gait velocity: Decreased Gait velocity interpretation: 1.31 - 2.62 ft/sec, indicative of limited community ambulator   General Gait Details: Pt utilizing  RW to off weight the RLE for pain control. No assist required.  Stairs            Wheelchair Mobility    Modified Rankin (Stroke Patients Only)       Balance Overall balance assessment: No apparent balance deficits (not formally assessed)                                           Pertinent Vitals/Pain Pain Assessment Pain Assessment: 0-10 Pain Score: 10-Worst pain ever Faces Pain Scale: Hurts even more Pain Descriptors / Indicators: Tender, Sore, Grimacing, Guarding Pain Intervention(s): Limited activity within patient's tolerance, Monitored during session, Repositioned    Home Living Family/patient expects to be discharged to:: Private residence Living Arrangements: Spouse/significant other Available Help at Discharge: Family;Available PRN/intermittently Type of Home: House Home Access: Level entry     Alternate Level Stairs-Number of Steps: "flight and a half" Home Layout: Two level;Bed/bath upstairs Home Equipment: None      Prior Function Prior Level of Function : Independent/Modified Independent;Working/employed;Driving (Working out at [employer], running and [employer])                     Journalist, newspaper        Extremity/Trunk Assessment   Upper Extremity Assessment Upper Extremity Assessment: Overall WFL for tasks assessed    Lower Extremity Assessment Lower Extremity Assessment: RLE deficits/detail RLE Deficits / Details: Decreased tolerance for palpation and functional activity. Pain increases in a dependent position. RLE Sensation: WNL (Per pt report no history of neuropathy)    Cervical / Trunk Assessment Cervical / Trunk Assessment: Normal  Communication   Communication: No difficulties  Cognition Arousal/Alertness: Awake/alert Behavior During Therapy: WFL for tasks assessed/performed Overall Cognitive Status: Within Functional Limits for tasks assessed                                           General Comments      Exercises     Assessment/Plan    PT Assessment Patient needs continued PT services  PT Problem List Decreased strength;Decreased activity tolerance;Decreased balance;Decreased mobility;Decreased safety awareness;Decreased knowledge of use of DME;Decreased knowledge of precautions;Pain       PT Treatment Interventions DME instruction;Gait training;Stair training;Functional mobility training;Therapeutic activities;Therapeutic exercise;Patient/family education    PT Goals (Current goals can be found in the Care Plan section)  Acute Rehab PT Goals Patient Stated Goal: Decrease pain, get back to PLOF PT Goal Formulation: With patient Time For Goal Achievement: 09/22/21 Potential to Achieve Goals: Good    Frequency Min 3X/week     Co-evaluation               AM-PAC PT "6 Clicks" Mobility  Outcome Measure Help needed turning from your back to your side while in a flat bed without using bedrails?: None Help needed moving from lying on your back to sitting on  the side of a flat bed without using bedrails?: None Help needed moving to and from a bed to a chair (including a wheelchair)?: None Help needed standing up from a chair using your arms (e.g., wheelchair or bedside chair)?: None Help needed to walk in hospital room?: None Help needed climbing 3-5 steps with a railing? : None 6 Click Score: 24    End of Session   Activity Tolerance: Patient limited by pain Patient left: in bed;with call bell/phone within reach Nurse Communication: Mobility status PT Visit Diagnosis: Unsteadiness on feet (R26.81);Pain Pain - Right/Left: Right Pain - part of body: Leg;Ankle and joints of foot    Time: [id number] PT Time Calculation (min) (ACUTE ONLY): 25 min   Charges:   PT Evaluation $PT Eval Low Complexity: 1 Low PT Treatments $Gait Training: 8-22 mins        Rolinda Roan, PT, DPT Acute Rehabilitation Services Secure Chat Preferred Office:  [place] 09/15/2021, 1:02 PM

## 2021-09-15 NOTE — Progress Notes (Signed)
Mobility Specialist Progress Note   09/15/21 1630  Mobility  Activity Off unit   Off unit for MRI. Will f/u later today if appropriate.  Frederico Hamman Mobility Specialist Phone Number [phone]

## 2021-09-16 DIAGNOSIS — I480 Paroxysmal atrial fibrillation: Secondary | ICD-10-CM

## 2021-09-16 DIAGNOSIS — I5022 Chronic systolic (congestive) heart failure: Secondary | ICD-10-CM

## 2021-09-16 LAB — CBC
HCT: 40.8 % (ref 39.0–52.0)
Hemoglobin: 13.2 g/dL (ref 13.0–17.0)
MCH: 26.5 pg (ref 26.0–34.0)
MCHC: 32.4 g/dL (ref 30.0–36.0)
MCV: 81.8 fL (ref 80.0–100.0)
Platelets: 331 10*3/uL (ref 150–400)
RBC: 4.99 MIL/uL (ref 4.22–5.81)
RDW: 15.3 % (ref 11.5–15.5)
WBC: 20.5 10*3/uL — ABNORMAL HIGH (ref 4.0–10.5)
nRBC: 0 % (ref 0.0–0.2)

## 2021-09-16 LAB — BASIC METABOLIC PANEL
Anion gap: 9 (ref 5–15)
BUN: 23 mg/dL — ABNORMAL HIGH (ref 6–20)
CO2: 23 mmol/L (ref 22–32)
Calcium: 8.7 mg/dL — ABNORMAL LOW (ref 8.9–10.3)
Chloride: 105 mmol/L (ref 98–111)
Creatinine, Ser: 2.18 mg/dL — ABNORMAL HIGH (ref 0.61–1.24)
GFR, Estimated: 36 mL/min — ABNORMAL LOW (ref >60)
Glucose, Bld: 126 mg/dL — ABNORMAL HIGH (ref 70–99)
Potassium: 3.6 mmol/L (ref 3.5–5.1)
Sodium: 137 mmol/L (ref 135–145)

## 2021-09-16 LAB — GLUCOSE, CAPILLARY
Glucose-Capillary: 132 mg/dL — ABNORMAL HIGH (ref 70–99)
Glucose-Capillary: 186 mg/dL — ABNORMAL HIGH (ref 70–99)
Glucose-Capillary: 215 mg/dL — ABNORMAL HIGH (ref 70–99)
Glucose-Capillary: 170 mg/dL — ABNORMAL HIGH (ref 70–99)

## 2021-09-16 LAB — UREA NITROGEN, URINE: Urea Nitrogen, Ur: 547 mg/dL

## 2021-09-16 MED ORDER — POTASSIUM CHLORIDE CRYS ER 20 MEQ PO TBCR
20.0000 meq | EXTENDED_RELEASE_TABLET | Freq: Once | ORAL | Status: AC
  Administered 2021-09-16: 20 meq via ORAL
  Filled 2021-09-16: qty 1

## 2021-09-16 MED ORDER — AMIODARONE HCL 200 MG PO TABS
200.0000 mg | ORAL_TABLET | Freq: Two times a day (BID) | ORAL | Status: DC
Start: 2021-09-16 — End: 2021-09-19
  Administered 2021-09-16 – 2021-09-19 (×7): 200 mg via ORAL
  Filled 2021-09-16 (×7): qty 1

## 2021-09-16 MED ORDER — SACUBITRIL-VALSARTAN 97-103 MG PO TABS
1.0000 | ORAL_TABLET | Freq: Two times a day (BID) | ORAL | Status: DC
  Administered 2021-09-16 – 2021-09-19 (×7): 1 via ORAL
  Filled 2021-09-16 (×8): qty 1

## 2021-09-16 MED ORDER — VANCOMYCIN HCL 2000 MG/400ML IV SOLN
2000.0000 mg | INTRAVENOUS | Status: DC
  Administered 2021-09-16 – 2021-09-18 (×3): 2000 mg via INTRAVENOUS
  Filled 2021-09-16 (×3): qty 400

## 2021-09-16 NOTE — Progress Notes (Signed)
Pharmacy Antibiotic Note  Randy [name] is a 51 y.o. male admitted on 09/14/2021 with  diabetic R foot infection .  Pharmacy has been consulted for Unasyn and Vancomycin dosing.  Renal function improved, SCr down 2.18 (baseline ~1.7-2). Tm 99, WBC up to 20.5, and blood cultures are ngtd.  Plan: Continue Unasyn 3g IV q6h Increase vancomycin to 2000 mg IV q24h. Goal AUC 400-550. Expected AUC: 433.2, SCr used: 2.18, Vd coeff 0.5 Will f/u renal function, micro data, and pt's clinical condition Vanc levels prn - consider at Css in obese pt     Temp (24hrs), Avg:98.6 F (37 C), Min:98 F (36.7 C), Max:99 F (37.2 C)  Recent Labs  Lab 09/14/21 1340 09/14/21 1545 09/15/21 0115 09/16/21 0333  WBC 18.0*  --  20.3* 20.5*  CREATININE 2.40*  --  2.41* 2.18*  LATICACIDVEN 1.0 1.0  --   --      Estimated Creatinine Clearance: 76.1 mL/min (A) (by C-G formula based on SCr of 2.18 mg/dL (H)).    Allergies  Allergen Reactions   Fish-Derived Products Anaphylaxis    Antimicrobials this admission: 5/21 Vanc >> 5/21 Unasyn >>   Microbiology results: 5/21 BCx: ngtd  Thank you for involving pharmacy in this patient's care.  Renold Genta, PharmD, BCPS Clinical Pharmacist Clinical phone for 09/16/2021 until 3p is [phone] 09/16/2021 9:32 AM  **Pharmacist phone directory can be found on [email or web address] listed under [place]**

## 2021-09-16 NOTE — Consult Note (Addendum)
Advanced Heart Failure Team Consult Note   Primary Physician: [facility] PCP-Cardiologist:  Dr. Haroldine Laws   Reason for Consultation: Pre-operative assessment    HPI:    Randy [name] is seen today for evaluation of preoperative assessment at the request of Dr. Johnnye Sima, Internal Medicine.   Randy [name] is a 51 year old with a history of HFrEF, permanent A fib, HTN, hypertensive retinopathy, CKD Stage IIIa, hyperlipidemia, DMII, Bells palsy, CVA, and OSA.    Admitted in 12/02/2019 with newly diagnosed A fib + ischemic CVA. He did not meet criteria for IV tPA. MRI showed large acute focal infarct, left frontal lobe predominantly ACA distribution. CT angiogram of the head and neck performed revealing no significant carotid, vertebral or intracranial stenosis. Echocardiogram obtained with bubble study showing no cardiac etiology for stroke. LVEF was 40 to 45%. Placed on Toprol XL. Creatinine on 12/04/20 1.7.    Followed at [facility] for cardiology services. Saw Norberto Sorenson on 12/12/2019. Set up for sleep study. Using CPAP 3 times a week.   Presented to [facility] with A/C HFrEF and A fib RVR.  Echo completed. EF down to 30-35% with speckled appears concerning for amyloid. Diuresed with IV lasix and placed on Amio drip. Once diuresed had TEE-DC-CV with restoration of NSR. Had RHC/LHC with min obs CAD, elevated filling pressures and normal cardiac output.  Diuresed 40 pounds. Discharge weight 367 pound  Repeat Echo 07/02/21 w/ improved EF 55% severe LVH  Last seen in the [facility] 3/8 and was back in Afib. Outpatient DCCV arranged. Underwent successful DCCV on 3/10 back to NSR. Had post procedural f/u in the [facility] on 3/20 but no showed and never rescheduled.   Recently injured rt toe. Seen at urgent care on 5/16 and given augmentin and steroids. Came to ED on 5/21 w/ progressive pain extending up to leg. Unable to bear weight.  Was afebrile in ED with WBC of 18. Exam c/w cellulitis. Placed on IV  abx. LE MRI c/w  osteomyelitis. Ortho consult pending.   [facility] consulted per pt request.   He has been stable from HF standpoint. Working out at [employer] and running. No limitations. NYHA Class I. Can tell when in Afib. No recent symptoms. Denies palpitations. Reports full compliance w/ cardiac meds.      Echo 07/02/21 left ventricular ejection fraction, by estimation, is 55%. The left ventricle has normal function. The left ventricle has no regional wall motion abnormalities. The left ventricular internal cavity size was mildly dilated. There is severe concentric left ventricular hypertrophy. Left ventricular diastolic parameters are indeterminate. 1. Right ventricular systolic function is mildly reduced. The right ventricular size is normal. 2. 3. Left atrial size was severely dilated. 4. Right atrial size was mildly dilated. The mitral valve is normal in structure. Trivial mitral valve regurgitation. No evidence of mitral stenosis. 5. 6. Tricuspid valve regurgitation is mild to moderate. The aortic valve is normal in structure. Aortic valve regurgitation is not visualized. Aortic valve sclerosis/calcification is present, without any evidence of aortic stenosis. 7. 8. Pulmonic valve regurgitation is moderate. The inferior vena cava is dilated in size with <50% respiratory variability, suggesting right atrial pressure of 15 mmHg.  Review of Systems: [y] = yes, [ ]  = no   General: Weight gain [ ] ; Weight loss [ ] ; Anorexia [ ] ; Fatigue [ ] ; Fever [ ] ; Chills [ ] ; Weakness [ ]   Cardiac: Chest pain/pressure [ ] ; Resting SOB [ ] ; Exertional SOB [ ] ;  Orthopnea [ ] ; Pedal Edema [ ] ; Palpitations [ ] ; Syncope [ ] ; Presyncope [ ] ; Paroxysmal nocturnal dyspnea[ ]   Pulmonary: Cough [ ] ; Wheezing[ ] ; Hemoptysis[ ] ; Sputum [ ] ; Snoring [ ]   GI: Vomiting[ ] ; Dysphagia[ ] ; Melena[ ] ; Hematochezia [ ] ; Heartburn[ ] ; Abdominal pain [ ] ; Constipation [ ] ; Diarrhea [ ] ; BRBPR [ ]   GU:  Hematuria[ ] ; Dysuria [ ] ; Nocturia[ ]   Vascular: Pain in legs with walking [ Y]; Pain in feet with lying flat [ ] ; Non-healing sores [Y ]; Stroke [ ] ; TIA [ ] ; Slurred speech [ ] ;  Neuro: Headaches[ ] ; Vertigo[ ] ; Seizures[ ] ; Paresthesias[ ] ;Blurred vision [ ] ; Diplopia [ ] ; Vision changes [ ]   Ortho/Skin: Arthritis [ Y]; Joint pain [ Y]; Muscle pain [Y ]; Joint swelling [ Y]; Back Pain [ ] ; Rash [ ]   Psych: Depression[ ] ; Anxiety[ ]   Heme: Bleeding problems [ ] ; Clotting disorders [ ] ; Anemia [ ]   Endocrine: Diabetes [Y ]; Thyroid dysfunction[ ]   Home Medications Prior to Admission medications   Medication Sig Start Date End Date Taking? Authorizing Provider  amiodarone (PACERONE) 200 MG tablet Take 1 tablet (200 mg total) by mouth 2 (two) times daily. 07/02/21  Yes Macklen Wilhoite, Shaune Pascal, MD  amoxicillin-clavulanate (AUGMENTIN) 875-125 MG tablet Take 1 tablet by mouth every 12 (twelve) hours. 09/09/21  Yes Hagler, Aaron Edelman, MD  apixaban (ELIQUIS) 5 MG TABS tablet Take 1 tablet (5 mg total) by mouth 2 (two) times daily. 04/16/21  Yes Clegg, Amy D, NP  atorvastatin (LIPITOR) 20 MG tablet Take 1 tablet (20 mg total) by mouth at bedtime. 04/16/21  Yes Clegg, Amy D, NP  empagliflozin (JARDIANCE) 10 MG TABS tablet Take 1 tablet (10 mg total) by mouth daily. Patient taking differently: Take 10 mg by mouth every morning. 04/16/21  Yes Clegg, Amy D, NP  furosemide (LASIX) 80 MG tablet Take 1 tablet (80 mg total) by mouth daily. Patient taking differently: Take 80 mg by mouth every morning. 07/02/21  Yes Brody Kump, Shaune Pascal, MD  isosorbide-hydrALAZINE (BIDIL) 20-37.5 MG tablet Take 2 tablets by mouth 3 (three) times daily.   Yes [provider]  metoprolol succinate (TOPROL-XL) 100 MG 24 hr tablet Take 1 tablet (100 mg total) by mouth daily. Take with or immediately following a meal. Patient taking differently: Take 100 mg by mouth every morning. Take with or immediately following a meal. 04/16/21  11/13/21 Yes Clegg, Amy D, NP  OVER THE COUNTER MEDICATION Take 15 mLs by mouth every morning. Sea moss gel   Yes [provider]  potassium chloride SA (KLOR-CON M20) 20 MEQ tablet TAKE 2 TABLETS BY MOUTH ONLY WHEN TAKING METOLAZONE Patient taking differently: 20 mEq 2 (two) times daily. 09/01/21  Yes Amiley Shishido, Shaune Pascal, MD  sacubitril-valsartan (ENTRESTO) 97-103 MG Take 1 tablet by mouth 2 (two) times daily. 04/16/21  Yes Clegg, Amy D, NP  latanoprost (XALATAN) 0.005 % ophthalmic solution Place 1 drop into both eyes at bedtime. Patient not taking: Reported on 07/02/2021 01/12/21   [provider]  mupirocin ointment (BACTROBAN) 2 % Apply 1 application. topically 2 (two) times daily. 09/09/21   Vanessa Kick, MD  predniSONE (DELTASONE) 20 MG tablet Take 2 tablets (40 mg total) by mouth daily. Patient not taking: Reported on 09/14/2021 09/09/21   Vanessa Kick, MD  spironolactone (ALDACTONE) 25 MG tablet Take 1 tablet (25 mg total) by mouth daily. Patient not taking: Reported on 09/14/2021 04/16/21 07/15/21  Conrad Fairbank, NP  Past Medical History: Past Medical History:  Diagnosis Date   CKD (chronic kidney disease) stage 3, GFR 30-59 ml/min (HCC)    baseline Cr 1.5-1.7   Decreased visual acuity    Left eye, resolved - hypertensive retinopathy   Diabetes mellitus 2012   dx hospitalization with A1c 6.6%   Hilar adenopathy    on CT scan 02/2010, on rpt scan stable/improved.   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   Systolic murmur    Vitamin D deficiency     Past Surgical History: Past Surgical History:  Procedure Laterality Date   CARDIOVERSION N/A 01/31/2021   Procedure:  CARDIOVERSION;  Surgeon: Jolaine Artist, MD;  Location: [place];  Service: Cardiovascular;  Laterality: N/A;   CARDIOVERSION N/A 07/04/2021   Procedure: CARDIOVERSION;  Surgeon: Jolaine Artist, MD;  Location: [facility] ENDOSCOPY;  Service: Cardiovascular;  Laterality: N/A;   hospitalization  11/2008   malignant HTN, nl SPEP/UPEP, neg ANCA panel, nl C3/4, neg anti GBM Ab, neg Hep A/B/C, nl renal US, nl PTH, neg HIV   RIGHT/LEFT HEART CATH AND CORONARY ANGIOGRAPHY N/A 01/29/2021   Procedure: RIGHT/LEFT HEART CATH AND CORONARY ANGIOGRAPHY;  Surgeon: Jolaine Artist, MD;  Location: [place] CV LAB;  Service: Cardiovascular;  Laterality: N/A;   TEE WITHOUT CARDIOVERSION N/A 01/31/2021   Procedure: TRANSESOPHAGEAL ECHOCARDIOGRAM (TEE);  Surgeon: Jolaine Artist, MD;  Location: [facility] ENDOSCOPY;  Service: Cardiovascular;  Laterality: N/A;   US ECHOCARDIOGRAPHY  11/2008   LVsys fxn EF 50%, mild Randy, normal LV size, neg ANA, neg cryoglobulins   US ECHOCARDIOGRAPHY  2011   severe LVH, EF 40%, LA mildly dilated, PA pressure moderately increased    Family History: Family History  Problem Relation Age of Onset   Hypertension Mother    Hypertension Father    Diabetes Father    Hypertension Brother    Diabetes Sister    Coronary artery disease Neg Hx    Stroke Neg Hx    Cancer Neg Hx    Kidney disease Paternal Grandmother        ESRD    Social History: Social History   Socioeconomic History   Marital status: Married    Spouse name: Keyler Hoge   Number of children: 3   Years of education: Not on file   Highest education level: Bachelor's degree (e.g., BA, AB, BS)  Occupational History   Occupation: Tax Forensic psychologist    Comment: [employer]  Tobacco Use   Smoking status: Never   Smokeless tobacco: Never   Tobacco comments:    monthly, when going out  Vaping Use   Vaping Use: Never used  Substance and Sexual Activity   Alcohol use: Yes    Comment: Occasional   Drug  use: No   Sexual activity: Not on file  Other Topics Concern   Not on file  Social History Narrative   Newly married, 2012, 1 daughter, 1 son   No injectable steroid cycles   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, 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)   Regular exercise-yes   Social Determinants of Health  Financial Resource Strain: Low Risk    Difficulty of Paying Living Expenses: Not hard at all  Food Insecurity: No Food Insecurity   Worried About Charity fundraiser in the Last Year: Never true   Ran Out of Food in the Last Year: Never true  Transportation Needs: No Transportation Needs   Lack of Transportation (Medical): No   Lack of Transportation (Non-Medical): No  Physical Activity: Not on file  Stress: Not on file  Social Connections: Not on file    Allergies:  Allergies  Allergen Reactions   Fish-Derived Products Anaphylaxis    Objective:    Vital Signs:   Temp:  [98 F (36.7 C)-99 F (37.2 C)] 98.9 F (37.2 C) (05/23 0745) Pulse Rate:  [68-69] 68 (05/23 0745) Resp:  [17-18] 17 (05/23 0745) BP: (135-162)/(75-91) 162/88 (05/23 0745) SpO2:  [94 %-100 %] 94 % (05/23 0745) Last BM Date : 09/15/21  Weight change: There were no vitals filed for this visit.  Intake/Output:   Intake/Output Summary (Last 24 hours) at 09/16/2021 1007 Last data filed at 09/16/2021 0524 Gross per 24 hour  Intake 290 ml  Output 2080 ml  Net -1790 ml      Physical Exam    General:  Well appearing, obese No resp difficulty HEENT: normal Neck: supple. JVD not elevated. Carotids 2+ bilat; no bruits. No lymphadenopathy or thyromegaly appreciated. Cor: PMI nondisplaced. Regular rate & rhythm. No rubs, gallops or murmurs. Lungs: clear Abdomen: soft, nontender, nondistended. No hepatosplenomegaly. No bruits or masses. Good bowel sounds. Extremities: no cyanosis, clubbing, rash, edema, Rt LE foot leg bandaged 2+ DPs bilaterally  Neuro: alert & orientedx3, cranial nerves  grossly intact. moves all 4 extremities w/o difficulty. Affect pleasant   Telemetry   N/A   EKG    No EKG to review   Labs   Basic Metabolic Panel: Recent Labs  Lab 09/14/21 1340 09/15/21 0115 09/16/21 0333  NA 136 138 137  K 3.9 4.0 3.6  CL 103 104 105  CO2 26 25 23   GLUCOSE 261* 219* 126*  BUN 26* 28* 23*  CREATININE 2.40* 2.41* 2.18*  CALCIUM 8.9 8.8* 8.7*    Liver Function Tests: Recent Labs  Lab 09/14/21 1340 09/15/21 0115  AST 14* 14*  ALT 19 16  ALKPHOS 54 52  BILITOT 0.9 0.7  PROT 7.0 6.5  ALBUMIN 2.9* 2.7*   No results for input(s): LIPASE, AMYLASE in the last 168 hours. No results for input(s): AMMONIA in the last 168 hours.  CBC: Recent Labs  Lab 09/14/21 1340 09/15/21 0115 09/16/21 0333  WBC 18.0* 20.3* 20.5*  NEUTROABS 14.1* 14.6*  --   HGB 13.0 13.2 13.2  HCT 41.1 42.1 40.8  MCV 82.7 83.5 81.8  PLT 316 322 331    Cardiac Enzymes: Recent Labs  Lab 09/14/21 1340  CKTOTAL 91    BNP: BNP (last 3 results) Recent Labs    01/26/21 1942 07/02/21 1255  BNP 318.4* 625.9*    ProBNP (last 3 results) No results for input(s): PROBNP in the last 8760 hours.   CBG: Recent Labs  Lab 09/15/21 0831 09/15/21 1154 09/15/21 1855 09/15/21 2111 09/16/21 0745  GLUCAP 265* 291* 189* 184* 132*    Coagulation Studies: No results for input(s): LABPROT, INR in the last 72 hours.   Imaging   Randy TIBIA FIBULA RIGHT WO CONTRAST  Result Date: 09/16/2021 CLINICAL DATA:  Severe right calf pain. EXAM: MRI OF LOWER RIGHT EXTREMITY WITHOUT CONTRAST TECHNIQUE: Multiplanar,  multisequence Randy imaging of the right lower leg was performed. No intravenous contrast was administered. COMPARISON:  None Available. FINDINGS: Bones/Joint/Cartilage No marrow signal abnormality. No fracture or dislocation. Joint spaces are preserved. No ankle joint effusion. Ligaments Collateral ligaments are intact. Muscles and Tendons Intact. Small amount of fascial fluid in  the flexor and extensor muscle compartments of the right lower leg. No significant muscle edema or atrophy. Soft tissue Moderate superficial soft tissue swelling the mid to distal medial lower leg no fluid collection or hematoma. 1.1 x 1.8 x 2.3 cm ganglion cyst posterior to the posterior talofibular ligament (series 11, image 8). IMPRESSION: 1. Moderate superficial soft tissue swelling of the mid to distal medial lower leg, nonspecific. Correlate for cellulitis. No abscess or osteomyelitis. 2. Mild fasciitis involving the right lower leg flexor and extensor muscle compartments of the right lower leg, without significant myositis. 3. 2.3 cm ganglion cyst posterior to the posterior talofibular ligament. Electronically Signed   By: Titus Dubin M.D.   On: 09/16/2021 08:26   Randy FOOT RIGHT WO CONTRAST  Result Date: 09/15/2021 CLINICAL DATA:  Foot swelling, diabetic, osteomyelitis suspected. EXAM: MRI OF THE RIGHT FOREFOOT WITHOUT CONTRAST TECHNIQUE: Multiplanar, multisequence Randy imaging of the right forefoot was performed. No intravenous contrast was administered. COMPARISON:  Radiographs dated Sep 14, 2021 FINDINGS: Bones/Joint/Cartilage Bone marrow edema of the proximal and distal phalanx of the first digit. Normal alignment. No joint effusion. No evidence of fracture or dislocation. Ligaments Collateral ligaments are intact.  Lisfranc ligament is intact. Muscles and Tendons Flexor, peroneal and extensor compartment tendons are intact. Increase intramuscular signal suggesting diabetic myopathy/myositis. No fluid collection or abscess. Soft tissue Marked skin thickening and subcutaneous soft tissue edema prominent about the dorsum of the foot. There is a deep skin wound about the medial aspect of the proximal phalanx of the first digit with surrounding inflammatory changes. No soft tissue mass. IMPRESSION: 1. Bone marrow edema of the proximal and distal phalanges of the first digit which in the presence of  adjacent deep skin wound is consistent with osteomyelitis. 2. Marked skin thickening and subcutaneous soft tissue edema about the dorsum of the foot. No drainable fluid collection or abscess. Electronically Signed   By: Keane Police D.O.   On: 09/15/2021 20:19     Medications:     Current Medications:  acetaminophen  1,000 mg Oral Q8H   amiodarone  200 mg Oral BID   apixaban  5 mg Oral BID   atorvastatin  20 mg Oral QHS   collagenase   Topical Daily   empagliflozin  10 mg Oral Daily   insulin aspart  0-15 Units Subcutaneous TID WC   insulin glargine-yfgn  12 Units Subcutaneous QHS   isosorbide-hydrALAZINE  2 tablet Oral TID   lidocaine  1 patch Transdermal Q24H   metoprolol succinate  100 mg Oral Daily   sacubitril-valsartan  1 tablet Oral BID    Infusions:  ampicillin-sulbactam (UNASYN) IV 3 g (09/16/21 0535)   vancomycin        Patient Profile   51 y/o male w/ HTN, HFrEF>>HFimEF, NICM, nonobstructive CAD, PAF, poorly controlled Type2DM, OSA and Stage IIIb CKD admitted w/ RLE osteomyelitis.   Assessment/Plan   Rt LE Osteomyelitis - IV abx per primary team  - BCx NGTD  - ortho consult pending  - hopefully will improve w/ IV abx, but ok from cardiac standpoint for surgery if needed   2. 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 LHC w/ mild  nonobstructive CAD. Suspect HTN cardiomyopathy.   -ECHO 01/31/21 EF 30-35% with severe LVH. Doubt amyloid with uncontrolled hypertension. - Echo 3/23 EF 55-60% (recovered EF) - NYHA Class I, euvolemic on exam  - Continue Entresto 97-103 mg bid - Continue Jardiance 10 mg daily  - Continue Bidil 2 tab tid  - no loop diuretic requirements currently   3. PAF - 01/31/21 S/P TEE/DC-CV  with restoration NSR  - recurrent AF 3/23 s/p DCCV - on amio 200 mg bid - denies symptoms of breakthrough AF. RRR on exam  - on Eliquis 5 mg bid    4. Hypertension  - moderately elevated, in setting of pain - GDMT per above - PRN IV hydralazine  if needed   5. CKD Stage IIIb  - Creatinine baseline 1.7-1.9 - SCr 2.2 today, follow BMP  - Followed by twice a year by Nephrology in [place].    6. DMII - poorly controlled, A1c 10.1  - insulin per primary team  - on Jardiance    7. OSA - on CPAP    8. CAD - mild nonobstructive on cath - no s/s angina  - c/w statin, LDL Goal < 70  - on ? blocker - no ASA w/ Eliquis     Length of Stay: 1  Brittainy Simmons, PA-C  09/16/2021, 10:07 AM  Advanced Heart Failure Team Pager [phone] (M-F; 7a - 5p)  Please contact [facility] for night-coverage after hours (4p -7a ) and weekends on [email or web address]  Patient seen and examined with the above-signed Advanced Practice Provider and/or Housestaff. I personally reviewed laboratory data, imaging studies and relevant notes. I independently examined the patient and formulated the important aspects of the plan. I have edited the note to reflect any of my changes or salient points. I have personally discussed the plan with the patient and/or family.  51 yo male as above with HFrEF with recovered EF likely due to hypertensive CM. Also h/o AF, CVA, CKD 3a, DM2.   Has been doing well from HDF perspective. Very active. NYHA I. Volume status well controlled.   Admitted with R foot/great toe cellulitis/osteo after stubbing his toe while moving furniture. On IV abx.   Denies CP or SOB. Remains in NSR.   General:  Sitting up in bed. Well appearing. No resp difficulty HEENT: normal Neck: supple. no JVD. Carotids 2+ bilat; no bruits. No lymphadenopathy or thryomegaly appreciated. Cor: PMI nondisplaced. Regular rate & rhythm. No rubs, gallops or murmurs. Lungs: clear Abdomen: soft, nontender, nondistended. No hepatosplenomegaly. No bruits or masses. Good bowel sounds. Extremities: no cyanosis, clubbing, rash, edema R great toe wrapped. Significant wound under dressing. Forefoot is erythematous  DP 1+ PT 2+  Neuro: alert & orientedx3, cranial  nerves grossly intact. moves all 4 extremities w/o difficulty. Affect pleasant  Doing very well from HF perspective. Would continue current regimen.   Seems to have good blood flow to foot on exam so hopefully will heal his infection with good wound car and abx. Appreciate IMTS care.   From cardiac perspective he is at low risk for any pre-op CV complications and can proceed to OR if needed.   Please call us if we can help further with his care.   Glori Bickers, MD  11:40 AM

## 2021-09-16 NOTE — Progress Notes (Signed)
Mobility Specialist Progress Note:   09/16/21 1050  Mobility  Activity Ambulated independently in room  Level of Assistance Modified independent, requires aide device or extra time  Assistive Device None  Distance Ambulated (ft) 100 ft  Activity Response Tolerated well  $Mobility charge 1 Mobility   Pt agreeable to mobility session. Required no physical assistance throughout. Pt c/o pain when "the blood flows down to my foot", however goes away within minutes. Pt able to ambulate 129f in room with no aide or device. Left with all needs met.   [facility] Secure Chat or Office Phone: [phone]

## 2021-09-16 NOTE — Consult Note (Signed)
Reason for Consult:Right great toe osteo Referring Physician: Bobby Rumpf Time called: 2353 Time at bedside: [place] is an 51 y.o. male.  HPI: Wess hurt his toe moving some furniture about 2 weeks ago. He had been treating it at home then was professionally evaluated and put on antibiotics but it continued to worsen and he came to the ED for evaluation. MRI showed great toe osteo, he was admitted, and orthopedic surgery was consulted. 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  Past Medical History:  Diagnosis Date   CKD (chronic kidney disease) stage 3, GFR 30-59 ml/min (HCC)    baseline Cr 1.5-1.7   Decreased visual acuity    Left eye, resolved - hypertensive retinopathy   Diabetes mellitus 2012   dx hospitalization with A1c 6.6%   Hilar adenopathy    on CT scan 02/2010, on rpt scan stable/improved.   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   Systolic murmur    Vitamin D deficiency     Past Surgical History:  Procedure Laterality Date   CARDIOVERSION N/A 01/31/2021   Procedure: CARDIOVERSION;  Surgeon: Jolaine Artist, MD;  Location: [facility];  Service: Cardiovascular;  Laterality: N/A;   CARDIOVERSION N/A 07/04/2021   Procedure: CARDIOVERSION;  Surgeon: Jolaine Artist, MD;  Location: [facility] ENDOSCOPY;  Service: Cardiovascular;  Laterality: N/A;   hospitalization  11/2008   malignant HTN, nl SPEP/UPEP, neg ANCA panel, nl C3/4, neg anti GBM Ab, neg Hep A/B/C, nl renal US, nl PTH, neg HIV   RIGHT/LEFT HEART CATH AND CORONARY ANGIOGRAPHY N/A 01/29/2021   Procedure: RIGHT/LEFT HEART CATH AND CORONARY ANGIOGRAPHY;  Surgeon: Jolaine Artist,  MD;  Location: [facility];  Service: Cardiovascular;  Laterality: N/A;   TEE WITHOUT CARDIOVERSION N/A 01/31/2021   Procedure: TRANSESOPHAGEAL ECHOCARDIOGRAM (TEE);  Surgeon: Jolaine Artist, MD;  Location: [facility] ENDOSCOPY;  Service: Cardiovascular;  Laterality: N/A;   US ECHOCARDIOGRAPHY  11/2008   LVsys fxn EF 50%, mild MR, normal LV size, neg ANA, neg cryoglobulins   US ECHOCARDIOGRAPHY  2011   severe LVH, EF 40%, LA mildly dilated, PA pressure moderately increased    Family History  Problem Relation Age of Onset   Hypertension Mother    Hypertension Father    Diabetes Father    Hypertension Brother    Diabetes Sister    Coronary artery disease Neg Hx    Stroke Neg Hx    Cancer Neg Hx    Kidney disease Paternal Grandmother        ESRD    Social History:  reports that he has never smoked. He has never used smokeless tobacco. He reports current alcohol use. He reports that he does not use drugs.  Allergies:  Allergies  Allergen Reactions   Fish-Derived Products Anaphylaxis    Medications: I have reviewed the patient's current medications.  Results for orders placed or performed during the hospital encounter of 09/14/21 (from the past 48 hour(s))  Lactic acid, plasma     Status: None   Collection Time: 09/14/21  1:40 PM  Result  Value Ref Range   Lactic Acid, Venous 1.0 0.5 - 1.9 mmol/L    Comment: Performed at [facility] [address], [place], [place] [address]  Comprehensive metabolic panel     Status: Abnormal   Collection Time: 09/14/21  1:40 PM  Result Value Ref Range   Sodium 136 135 - 145 mmol/L   Potassium 3.9 3.5 - 5.1 mmol/L   Chloride 103 98 - 111 mmol/L   CO2 26 22 - 32 mmol/L   Glucose, Bld 261 (H) 70 - 99 mg/dL    Comment: Glucose reference range applies only to samples taken after fasting for at least 8 hours.   BUN 26 (H) 6 - 20 mg/dL   Creatinine, Ser 2.40 (H) 0.61 - 1.24 mg/dL   Calcium 8.9 8.9 - 10.3 mg/dL   Total Protein 7.0 6.5  - 8.1 g/dL   Albumin 2.9 (L) 3.5 - 5.0 g/dL   AST 14 (L) 15 - 41 U/L   ALT 19 0 - 44 U/L   Alkaline Phosphatase 54 38 - 126 U/L   Total Bilirubin 0.9 0.3 - 1.2 mg/dL   GFR, Estimated 32 (L) >60 mL/min    Comment: (NOTE) Calculated using the CKD-EPI Creatinine Equation (2021)    Anion gap 7 5 - 15    Comment: Performed at [facility], [place] [address], [place], [place] [address]  CBC with Differential     Status: Abnormal   Collection Time: 09/14/21  1:40 PM  Result Value Ref Range   WBC 18.0 (H) 4.0 - 10.5 K/uL   RBC 4.97 4.22 - 5.81 MIL/uL   Hemoglobin 13.0 13.0 - 17.0 g/dL   HCT 41.1 39.0 - 52.0 %   MCV 82.7 80.0 - 100.0 fL   MCH 26.2 26.0 - 34.0 pg   MCHC 31.6 30.0 - 36.0 g/dL   RDW 15.1 11.5 - 15.5 %   Platelets 316 150 - 400 K/uL   nRBC 0.0 0.0 - 0.2 %   Neutrophils Relative % 78 %   Neutro Abs 14.1 (H) 1.7 - 7.7 K/uL   Lymphocytes Relative 13 %   Lymphs Abs 2.3 0.7 - 4.0 K/uL   Monocytes Relative 8 %   Monocytes Absolute 1.5 (H) 0.1 - 1.0 K/uL   Eosinophils Relative 0 %   Eosinophils Absolute 0.0 0.0 - 0.5 K/uL   Basophils Relative 0 %   Basophils Absolute 0.0 0.0 - 0.1 K/uL   Immature Granulocytes 1 %   Abs Immature Granulocytes 0.10 (H) 0.00 - 0.07 K/uL    Comment: Performed at [facility] [address], [place], [place] [address]  CK     Status: None   Collection Time: 09/14/21  1:40 PM  Result Value Ref Range   Total CK 91 49 - 397 U/L    Comment: Performed at [facility], [place] [address] [place], [place] [address]  Hemoglobin A1c     Status: Abnormal   Collection Time: 09/14/21  1:40 PM  Result Value Ref Range   Hgb A1c MFr Bld 10.1 (H) 4.8 - 5.6 %    Comment: (NOTE) Pre diabetes:          5.7%-6.4%  Diabetes:              >6.4%  Glycemic control for   <7.0% adults with diabetes    Mean Plasma Glucose 243.17 mg/dL    Comment: Performed at [facility], [address] [place], [place] [address]  Blood culture (routine x 2)      Status: None (Preliminary result)   Collection Time: 09/14/21  2:24 PM   Specimen: BLOOD LEFT FOREARM  Result Value Ref Range   Specimen Description BLOOD LEFT FOREARM    Special Requests      BOTTLES DRAWN AEROBIC AND ANAEROBIC Blood Culture adequate volume   Culture      NO GROWTH 2 DAYS Performed at [facility], [address] [place], [place] [address]    Report Status PENDING   Blood culture (routine x 2)     Status: None (Preliminary result)   Collection Time: 09/14/21  2:29 PM   Specimen: BLOOD LEFT FOREARM  Result Value Ref Range   Specimen Description BLOOD LEFT FOREARM    Special Requests      BOTTLES DRAWN AEROBIC AND ANAEROBIC Blood Culture adequate volume   Culture      NO GROWTH 2 DAYS Performed at [facility], [address], [place], [place] [address]    Report Status PENDING   Lactic acid, plasma     Status: None   Collection Time: 09/14/21  3:45 PM  Result Value Ref Range   Lactic Acid, Venous 1.0 0.5 - 1.9 mmol/L    Comment: Performed at [facility], [address] [place], [place] [address]  Urinalysis, Routine w reflex microscopic Urine, Clean Catch     Status: Abnormal   Collection Time: 09/14/21  4:42 PM  Result Value Ref Range   Color, Urine YELLOW YELLOW   APPearance CLEAR CLEAR   Specific Gravity, Urine 1.013 1.005 - 1.030   pH 5.0 5.0 - 8.0   Glucose, UA >=500 (A) NEGATIVE mg/dL   Hgb urine dipstick NEGATIVE NEGATIVE   Bilirubin Urine NEGATIVE NEGATIVE   Ketones, ur NEGATIVE NEGATIVE mg/dL   Protein, ur 30 (A) NEGATIVE mg/dL   Nitrite NEGATIVE NEGATIVE   Leukocytes,Ua NEGATIVE NEGATIVE   RBC / HPF 0-5 0 - 5 RBC/hpf   Bacteria, UA NONE SEEN NONE SEEN    Comment: Performed at [facility], [address] [place], [place] [address]  CBG monitoring, ED     Status: Abnormal   Collection Time: 09/14/21  4:47 PM  Result Value Ref Range   Glucose-Capillary 263 (H) 70 - 99 mg/dL    Comment: Glucose reference range  applies only to samples taken after fasting for at least 8 hours.  Glucose, capillary     Status: Abnormal   Collection Time: 09/14/21  9:56 PM  Result Value Ref Range   Glucose-Capillary 298 (H) 70 - 99 mg/dL    Comment: Glucose reference range applies only to samples taken after fasting for at least 8 hours.  Comprehensive metabolic panel     Status: Abnormal   Collection Time: 09/15/21  1:15 AM  Result Value Ref Range   Sodium 138 135 - 145 mmol/L   Potassium 4.0 3.5 - 5.1 mmol/L   Chloride 104 98 - 111 mmol/L   CO2 25 22 - 32 mmol/L   Glucose, Bld 219 (H) 70 - 99 mg/dL    Comment: Glucose reference range applies only to samples taken after fasting for at least 8 hours.   BUN 28 (H) 6 - 20 mg/dL   Creatinine, Ser 2.26 (H) 0.61 - 1.24 mg/dL   Calcium 8.8 (L) 8.9 - 10.3 mg/dL   Total Protein 6.5 6.5 - 8.1 g/dL  Albumin 2.7 (L) 3.5 - 5.0 g/dL   AST 14 (L) 15 - 41 U/L   ALT 16 0 - 44 U/L   Alkaline Phosphatase 52 38 - 126 U/L   Total Bilirubin 0.7 0.3 - 1.2 mg/dL   GFR, Estimated 32 (L) >60 mL/min    Comment: (NOTE) Calculated using the CKD-EPI Creatinine Equation (2021)    Anion gap 9 5 - 15    Comment: Performed at [facility], [address], [place], [place] [address]  CBC with Differential/Platelet     Status: Abnormal   Collection Time: 09/15/21  1:15 AM  Result Value Ref Range   WBC 20.3 (H) 4.0 - 10.5 K/uL   RBC 5.04 4.22 - 5.81 MIL/uL   Hemoglobin 13.2 13.0 - 17.0 g/dL   HCT 40.9 81.1 - 91.4 %   MCV 83.5 80.0 - 100.0 fL   MCH 26.2 26.0 - 34.0 pg   MCHC 31.4 30.0 - 36.0 g/dL   RDW 78.2 95.6 - 21.3 %   Platelets 322 150 - 400 K/uL   nRBC 0.0 0.0 - 0.2 %   Neutrophils Relative % 72 %   Neutro Abs 14.6 (H) 1.7 - 7.7 K/uL   Lymphocytes Relative 18 %   Lymphs Abs 3.6 0.7 - 4.0 K/uL   Monocytes Relative 10 %   Monocytes Absolute 2.0 (H) 0.1 - 1.0 K/uL   Eosinophils Relative 0 %   Eosinophils Absolute 0.0 0.0 - 0.5 K/uL   Basophils Relative 0 %   Basophils  Absolute 0.1 0.0 - 0.1 K/uL   Immature Granulocytes 0 %   Abs Immature Granulocytes 0.09 (H) 0.00 - 0.07 K/uL    Comment: Performed at [facility], [address] [place], [place] [address]  Glucose, capillary     Status: Abnormal   Collection Time: 09/15/21  8:31 AM  Result Value Ref Range   Glucose-Capillary 265 (H) 70 - 99 mg/dL    Comment: Glucose reference range applies only to samples taken after fasting for at least 8 hours.  Creatinine, urine, random     Status: None   Collection Time: 09/15/21  9:10 AM  Result Value Ref Range   Creatinine, Urine 75.56 mg/dL    Comment: Performed at [facility], [address] [place], [place] [address]  Sodium, urine, random     Status: None   Collection Time: 09/15/21  9:10 AM  Result Value Ref Range   Sodium, Ur 49 mmol/L    Comment: Performed at [facility], [address], [place], [place] [address]  Glucose, capillary     Status: Abnormal   Collection Time: 09/15/21 11:54 AM  Result Value Ref Range   Glucose-Capillary 291 (H) 70 - 99 mg/dL    Comment: Glucose reference range applies only to samples taken after fasting for at least 8 hours.  Glucose, capillary     Status: Abnormal   Collection Time: 09/15/21  6:55 PM  Result Value Ref Range   Glucose-Capillary 189 (H) 70 - 99 mg/dL    Comment: Glucose reference range applies only to samples taken after fasting for at least 8 hours.  Glucose, capillary     Status: Abnormal   Collection Time: 09/15/21  9:11 PM  Result Value Ref Range   Glucose-Capillary 184 (H) 70 - 99 mg/dL    Comment: Glucose reference range applies only to samples taken after fasting for at least 8 hours.  CBC  Status: Abnormal   Collection Time: 09/16/21  3:33 AM  Result Value Ref Range   WBC 20.5 (H) 4.0 - 10.5 K/uL   RBC 4.99 4.22 - 5.81 MIL/uL   Hemoglobin 13.2 13.0 - 17.0 g/dL   HCT 40.8 39.0 - 52.0 %   MCV 81.8 80.0 - 100.0 fL   MCH 26.5 26.0 - 34.0 pg   MCHC 32.4 30.0 - 36.0  g/dL   RDW 15.3 11.5 - 15.5 %   Platelets 331 150 - 400 K/uL   nRBC 0.0 0.0 - 0.2 %    Comment: Performed at [facility], [place] [address], [place], [place] [id number]  Basic metabolic panel     Status: Abnormal   Collection Time: 09/16/21  3:33 AM  Result Value Ref Range   Sodium 137 135 - 145 mmol/L   Potassium 3.6 3.5 - 5.1 mmol/L   Chloride 105 98 - 111 mmol/L   CO2 23 22 - 32 mmol/L   Glucose, Bld 126 (H) 70 - 99 mg/dL    Comment: Glucose reference range applies only to samples taken after fasting for at least 8 hours.   BUN 23 (H) 6 - 20 mg/dL   Creatinine, Ser 2.18 (H) 0.61 - 1.24 mg/dL   Calcium 8.7 (L) 8.9 - 10.3 mg/dL   GFR, Estimated 36 (L) >60 mL/min    Comment: (NOTE) Calculated using the CKD-EPI Creatinine Equation (2021)    Anion gap 9 5 - 15    Comment: Performed at [facility] [address] [place], [place] [address]  Glucose, capillary     Status: Abnormal   Collection Time: 09/16/21  7:45 AM  Result Value Ref Range   Glucose-Capillary 132 (H) 70 - 99 mg/dL    Comment: Glucose reference range applies only to samples taken after fasting for at least 8 hours.  Glucose, capillary     Status: Abnormal   Collection Time: 09/16/21 11:29 AM  Result Value Ref Range   Glucose-Capillary 186 (H) 70 - 99 mg/dL    Comment: Glucose reference range applies only to samples taken after fasting for at least 8 hours.    MR TIBIA FIBULA RIGHT WO CONTRAST  Result Date: 09/16/2021 CLINICAL DATA:  Severe right calf pain. EXAM: MRI OF LOWER RIGHT EXTREMITY WITHOUT CONTRAST TECHNIQUE: Multiplanar, multisequence MR imaging of the right lower leg was performed. No intravenous contrast was administered. COMPARISON:  None Available. FINDINGS: Bones/Joint/Cartilage No marrow signal abnormality. No fracture or dislocation. Joint spaces are preserved. No ankle joint effusion. Ligaments Collateral ligaments are intact. Muscles and Tendons Intact. Small amount of fascial fluid in  the flexor and extensor muscle compartments of the right lower leg. No significant muscle edema or atrophy. Soft tissue Moderate superficial soft tissue swelling the mid to distal medial lower leg no fluid collection or hematoma. 1.1 x 1.8 x 2.3 cm ganglion cyst posterior to the posterior talofibular ligament (series 11, image 8). IMPRESSION: 1. Moderate superficial soft tissue swelling of the mid to distal medial lower leg, nonspecific. Correlate for cellulitis. No abscess or osteomyelitis. 2. Mild fasciitis involving the right lower leg flexor and extensor muscle compartments of the right lower leg, without significant myositis. 3. 2.3 cm ganglion cyst posterior to the posterior talofibular ligament. Electronically Signed   By: Titus Dubin M.D.   On: 09/16/2021 08:26   MR FOOT RIGHT WO CONTRAST  Result Date: 09/15/2021 CLINICAL DATA:  Foot swelling, diabetic, osteomyelitis suspected. EXAM: MRI OF THE  RIGHT FOREFOOT WITHOUT CONTRAST TECHNIQUE: Multiplanar, multisequence MR imaging of the right forefoot was performed. No intravenous contrast was administered. COMPARISON:  Radiographs dated Sep 14, 2021 FINDINGS: Bones/Joint/Cartilage Bone marrow edema of the proximal and distal phalanx of the first digit. Normal alignment. No joint effusion. No evidence of fracture or dislocation. Ligaments Collateral ligaments are intact.  Lisfranc ligament is intact. Muscles and Tendons Flexor, peroneal and extensor compartment tendons are intact. Increase intramuscular signal suggesting diabetic myopathy/myositis. No fluid collection or abscess. Soft tissue Marked skin thickening and subcutaneous soft tissue edema prominent about the dorsum of the foot. There is a deep skin wound about the medial aspect of the proximal phalanx of the first digit with surrounding inflammatory changes. No soft tissue mass. IMPRESSION: 1. Bone marrow edema of the proximal and distal phalanges of the first digit which in the presence of  adjacent deep skin wound is consistent with osteomyelitis. 2. Marked skin thickening and subcutaneous soft tissue edema about the dorsum of the foot. No drainable fluid collection or abscess. Electronically Signed   By: Keane Police D.O.   On: 09/15/2021 20:19   DG Foot Complete Right  Result Date: 09/14/2021 CLINICAL DATA:  Wound involving the first digit of the right foot. EXAM: RIGHT FOOT COMPLETE - 3+ VIEW COMPARISON:  Ankle radiographs dated 09/28/2011. FINDINGS: There is no evidence of fracture or dislocation. A posterior calcaneal enthesophyte is noted. No focal osseous demineralization to suggest osteomyelitis. There is soft tissue swelling and potentially a soft tissue defect/laceration surrounding the first digit. No radiopaque foreign body. IMPRESSION: No focal osseous demineralization to suggest osteomyelitis. No radiopaque foreign body. Electronically Signed   By: Zerita Boers M.D.   On: 09/14/2021 14:57   VAS [place] LOWER EXTREMITY VENOUS (DVT) (ONLY [facility] & [facility])  Result Date: 09/15/2021  Lower Venous DVT Study Patient Name:  Randy [name]  Date of Exam:   09/14/2021 Medical Rec #: [id number]       Accession #:    [id number] Date of Birth: 1971-01-14       Patient Gender: M Patient Age:   86 years Exam Location:  [facility] Procedure:      VAS [place] LOWER EXTREMITY VENOUS (DVT) Referring Phys: Debbe Mounts --------------------------------------------------------------------------------  Indications: Pain, Swelling, and Ulceration of right great toe.  Risk Factors: Diabetes. Atrial fibrillation. Anticoagulation: Eliquis. Comparison Study: No prior study on file Performing Technologist: Sharion Dove RVS  Examination Guidelines: A complete evaluation includes B-mode imaging, spectral Doppler, color Doppler, and power Doppler as needed of all accessible portions of each vessel. Bilateral testing is considered an integral part of a complete examination. Limited examinations for reoccurring  indications may be performed as noted. The reflux portion of the exam is performed with the patient in reverse Trendelenburg.  +---------+---------------+---------+-----------+----------+--------------+ RIGHT    CompressibilityPhasicitySpontaneityPropertiesThrombus Aging +---------+---------------+---------+-----------+----------+--------------+ CFV      Full           Yes      Yes                                 +---------+---------------+---------+-----------+----------+--------------+ SFJ      Full                                                        +---------+---------------+---------+-----------+----------+--------------+  FV Prox  Full                                                        +---------+---------------+---------+-----------+----------+--------------+ FV Mid   Full                                                        +---------+---------------+---------+-----------+----------+--------------+ FV DistalFull                                                        +---------+---------------+---------+-----------+----------+--------------+ PFV      Full                                                        +---------+---------------+---------+-----------+----------+--------------+ POP      Full           Yes      Yes                                 +---------+---------------+---------+-----------+----------+--------------+ PTV      Full                                                        +---------+---------------+---------+-----------+----------+--------------+ PERO     Full                                                        +---------+---------------+---------+-----------+----------+--------------+   +----+---------------+---------+-----------+----------+--------------+ LEFTCompressibilityPhasicitySpontaneityPropertiesThrombus Aging +----+---------------+---------+-----------+----------+--------------+ CFV Full            Yes      Yes                                 +----+---------------+---------+-----------+----------+--------------+    Summary: RIGHT: - No evidence of deep vein thrombosis in the lower extremity. No indirect evidence of obstruction proximal to the inguinal ligament. - As patient is diabetic with a great toe ulcer, the PT, AT, and DP were Dopplered indicating mulitphasic waveforms - Ultrasound characteristics of enlarged, reactive lymph nodes are noted in the groin.   *See table(s) above for measurements and observations. Electronically signed by Monica Martinez MD on 09/15/2021 at 5:11:49 PM.    Final     Review of Systems  Constitutional:  Negative for chills, diaphoresis and fever.  HENT:  Negative for ear discharge, ear pain, hearing loss and tinnitus.   Eyes:  Negative  for photophobia and pain.  Respiratory:  Negative for cough and shortness of breath.   Cardiovascular:  Negative for chest pain.  Gastrointestinal:  Negative for abdominal pain, nausea and vomiting.  Genitourinary:  Negative for dysuria, flank pain, frequency and urgency.  Musculoskeletal:  Positive for arthralgias (Right calf). Negative for back pain, myalgias and neck pain.  Neurological:  Negative for dizziness and headaches.  Hematological:  Does not bruise/bleed easily.  Psychiatric/Behavioral:  The patient is not nervous/anxious.   Blood pressure (!) 162/88, pulse 68, temperature 98.9 F (37.2 C), temperature source Oral, resp. rate 17, SpO2 94 %. Physical Exam Constitutional:      General: He is not in acute distress.    Appearance: He is well-developed. He is not diaphoretic.  HENT:     Head: Normocephalic and atraumatic.  Eyes:     General: No scleral icterus.       Right eye: No discharge.        Left eye: No discharge.     Conjunctiva/sclera: Conjunctivae normal.  Cardiovascular:     Rate and Rhythm: Normal rate and regular rhythm.  Pulmonary:     Effort: Pulmonary effort is normal. No  respiratory distress.  Musculoskeletal:     Cervical back: Normal range of motion.  Feet:     Comments: Right foot: Fusiform edema great toe with lateral purulence noted, erythema lateral midfoot, SPN/DPN/TN intact, 2+ DP, 1+ PT Skin:    General: Skin is warm and dry.  Neurological:     Mental Status: He is alert.  Psychiatric:        Mood and Affect: Mood normal.        Behavior: Behavior normal.    Assessment/Plan: Right great toe osteo -- Recommend amputation. Dr. Sharol Given to evaluate later today or in AM.    Lisette Abu, PA-C Orthopedic Surgery [phone] 09/16/2021, 12:30 PM

## 2021-09-16 NOTE — Progress Notes (Signed)
Subjective:  ON: NAE  The patient was seen at bedside during rounds this AM. Reports that his leg pain has mildly improved since yesterday.  He has no other complaints or concerns today.  Objective:  Vital signs in last 24 hours: Vitals:   09/15/21 0753 09/15/21 1957 09/16/21 0345 09/16/21 0745  BP: (!) 148/93 135/75 (!) 151/91 (!) 162/88  Pulse: 61 68 69 68  Resp: 18 18 18 17   Temp: 98.1 F (36.7 C) 99 F (37.2 C) 98 F (36.7 C) 98.9 F (37.2 C)  TempSrc: Oral Oral Oral Oral  SpO2: 96% 100% 98% 94%   General: NAD, nl appearance HE: Normocephalic, atraumatic, EOMI, Conjunctivae normal ENT: No congestion, no rhinorrhea, no exudate or erythema  Cardiovascular: Normal rate, regular rhythm. No murmurs, rubs, or gallops Pulmonary: Effort normal, breath sounds normal. No wheezes, rales, or rhonchi Abdominal: soft, nontender, bowel sounds present Musculoskeletal: no swelling, deformity, injury.  No obvious TTP along the medial aspect of the RLE. NVI. Skin: Warm, dry. There is swelling, erythema, and warmth of the right foot. Fluctuance is noted along the ventral aspect of the right great toe with no TTP. Contusion is noted along the dorsal aspect of the right great toe.  There is a small amount of bloody drainage from the contusion this morning.  See media tab for photos. Psychiatric/Behavioral: normal mood, normal behavior     Assessment/Plan:  Principal Problem:   Leg pain, medial, right Active Problems:   Cellulitis of right foot  #Cellulitis of the right great toe, and imaging c/w osteomyelitis #Foot ulcer The patient presented here with cellulitis of the right great toe with mild improvement with Augmentin as outpatient. Since admit here, he has been afebrile and hemodynamically stable though has a persistent leukocytosis, most recently at 20.5.  He now has right foot MRI findings consistent with osteomyelitis.  At this time, we will consult our orthopedic team to discuss  probable amputation with the patient today. Pt is in agreement with this plan.  Otherwise, we will continue IV antibiotics as below. -Orthopedics is on board, appreciate their recs -Continue IV vancomycin and Unasyn -Blood cultures show NGTD -WOC consult, appreciate recommendations  #Persistent medial right lower leg pain The patient continues to endorse right lower leg pain. Workup so far is negative for DVT.  Given persistent lower leg pain, we obtained an MRI of the right hip/fib, which showed moderate superficial soft tissue swelling of the mid to distal medial lower leg.  Likely extension of his cellulitis, but fortunately no findings to suggest osteomyelitis. We will continue IV antibiotics as above and his pain regimen as below. -Continue oxycodone 5 mg q4h PRN -Tylenol PRN  #AKI on CKD stage IIIb, improved Baseline creatinine of 1.7-2. Follows with nephrology in [place]. Cr on admit here was 2.40, most recently at 2.18. FENa of 1.1%, c/w intrinsic AKI though UA was unremarkable. Pt appears euvolemic on exam. Will CTM. -Trend BMP -Avoid nephrotoxic medications  #T2DM Most recent A1c of 10.1 during this admission. He is on glimepiride and jardiance at home. Glucose levels have been ranging in the low to high 100s on the below regimen. -Continue semglee 12U qhs -Continue jardiance 10 mg daily -SSI with meals -CBG monitoring  #HTN The patient is on Entresto, bidil, spiro, and metoprolol at home. Pt has been hypertensive most recently with systolics ranging in the 0000000.   -Continue home metoprolol and bidil -Resumed home entresto today  #Systolic CHF Followed by Dr. Haroldine Laws. Last  echo from March of this year showed EF 55%. Pt states he has not been on any diuretics at home but does take entresto, spiro, metoprolol, and jardiance. Patient appears euvolemic on exam today. -Continue home metoprolol -Continue home jardiance -Resumed entresto today -Strict I/O's -Daily  weights -Trend BMP  #Paroxysmal atrial fibrillation He is on amiodarone and metoprolol for rate control and on eliquis for anticoagulation. He underwent DCCV in October 2022 and last March 2023. -Continue home regimen  #HLD The patient is on atorvastatin 20 mg daily at home. -Continue home regimen  Prior to Admission Living Arrangement: Home Anticipated Discharge Location: Home Barriers to Discharge: Medical workup, orthopedics eval Dispo: Anticipated discharge pending ortho eval and management  Orvis Brill, MD 09/16/2021, 9:47 AM Pager: [phone]  After 5pm on weekdays and 1pm on weekends: On Call pager [phone]

## 2021-09-17 ENCOUNTER — Inpatient Hospital Stay (HOSPITAL_COMMUNITY): Payer: 59 | Admitting: Anesthesiology

## 2021-09-17 ENCOUNTER — Encounter (HOSPITAL_COMMUNITY): Payer: Self-pay | Admitting: Infectious Diseases

## 2021-09-17 ENCOUNTER — Encounter (HOSPITAL_COMMUNITY)
Admission: EM | Disposition: A | Discharge status: Home / Self Care | Payer: Self-pay | Source: Home / Self Care | Attending: Infectious Diseases

## 2021-09-17 ENCOUNTER — Other Ambulatory Visit: Payer: Self-pay

## 2021-09-17 DIAGNOSIS — L02611 Cutaneous abscess of right foot: Secondary | ICD-10-CM

## 2021-09-17 DIAGNOSIS — E1169 Type 2 diabetes mellitus with other specified complication: Secondary | ICD-10-CM

## 2021-09-17 DIAGNOSIS — M869 Osteomyelitis, unspecified: Secondary | ICD-10-CM

## 2021-09-17 DIAGNOSIS — N1832 Chronic kidney disease, stage 3b: Secondary | ICD-10-CM

## 2021-09-17 DIAGNOSIS — I130 Hypertensive heart and chronic kidney disease with heart failure and stage 1 through stage 4 chronic kidney disease, or unspecified chronic kidney disease: Secondary | ICD-10-CM

## 2021-09-17 DIAGNOSIS — I5022 Chronic systolic (congestive) heart failure: Secondary | ICD-10-CM

## 2021-09-17 HISTORY — PX: AMPUTATION: SHX166

## 2021-09-17 LAB — GLUCOSE, CAPILLARY
Glucose-Capillary: 142 mg/dL — ABNORMAL HIGH (ref 70–99)
Glucose-Capillary: 138 mg/dL — ABNORMAL HIGH (ref 70–99)
Glucose-Capillary: 94 mg/dL (ref 70–99)
Glucose-Capillary: 105 mg/dL — ABNORMAL HIGH (ref 70–99)
Glucose-Capillary: 99 mg/dL (ref 70–99)
Glucose-Capillary: 107 mg/dL — ABNORMAL HIGH (ref 70–99)
Glucose-Capillary: 199 mg/dL — ABNORMAL HIGH (ref 70–99)

## 2021-09-17 LAB — BASIC METABOLIC PANEL
Anion gap: 10 (ref 5–15)
BUN: 24 mg/dL — ABNORMAL HIGH (ref 6–20)
CO2: 19 mmol/L — ABNORMAL LOW (ref 22–32)
Calcium: 8.5 mg/dL — ABNORMAL LOW (ref 8.9–10.3)
Chloride: 108 mmol/L (ref 98–111)
Creatinine, Ser: 2.1 mg/dL — ABNORMAL HIGH (ref 0.61–1.24)
GFR, Estimated: 38 mL/min — ABNORMAL LOW (ref >60)
Glucose, Bld: 133 mg/dL — ABNORMAL HIGH (ref 70–99)
Potassium: 3.9 mmol/L (ref 3.5–5.1)
Sodium: 137 mmol/L (ref 135–145)

## 2021-09-17 LAB — CBC
HCT: 39.6 % (ref 39.0–52.0)
Hemoglobin: 12.6 g/dL — ABNORMAL LOW (ref 13.0–17.0)
MCH: 26.3 pg (ref 26.0–34.0)
MCHC: 31.8 g/dL (ref 30.0–36.0)
MCV: 82.7 fL (ref 80.0–100.0)
Platelets: 309 10*3/uL (ref 150–400)
RBC: 4.79 MIL/uL (ref 4.22–5.81)
RDW: 15.4 % (ref 11.5–15.5)
WBC: 18.3 10*3/uL — ABNORMAL HIGH (ref 4.0–10.5)
nRBC: 0 % (ref 0.0–0.2)

## 2021-09-17 LAB — MAGNESIUM: Magnesium: 2.2 mg/dL (ref 1.7–2.4)

## 2021-09-17 SURGERY — AMPUTATION, FOOT, RAY
Anesthesia: General | Laterality: Right

## 2021-09-17 MED ORDER — HYDROMORPHONE HCL 1 MG/ML IJ SOLN: 0.2500 mg | INTRAMUSCULAR | Status: DC | PRN

## 2021-09-17 MED ORDER — CEFAZOLIN SODIUM-DEXTROSE 2-4 GM/100ML-% IV SOLN
2.0000 g | INTRAVENOUS | Status: AC
  Administered 2021-09-17: 2 g via INTRAVENOUS
  Filled 2021-09-17: qty 100

## 2021-09-17 MED ORDER — FENTANYL CITRATE (PF) 250 MCG/5ML IJ SOLN
INTRAMUSCULAR | Status: DC | PRN
  Administered 2021-09-17 (×4): 50 ug via INTRAVENOUS
  Administered 2021-09-17: 100 ug via INTRAVENOUS

## 2021-09-17 MED ORDER — MEPERIDINE HCL 25 MG/ML IJ SOLN: 6.2500 mg | INTRAMUSCULAR | Status: DC | PRN

## 2021-09-17 MED ORDER — JUVEN PO PACK
1.0000 | PACK | Freq: Two times a day (BID) | ORAL | Status: DC
  Administered 2021-09-17 – 2021-09-19 (×4): 1 via ORAL
  Filled 2021-09-17 (×4): qty 1

## 2021-09-17 MED ORDER — HYDROMORPHONE HCL 1 MG/ML IJ SOLN
0.5000 mg | INTRAMUSCULAR | Status: DC | PRN
  Administered 2021-09-18: 1 mg via INTRAVENOUS
  Filled 2021-09-17: qty 1

## 2021-09-17 MED ORDER — HYDRALAZINE HCL 20 MG/ML IJ SOLN: 5.0000 mg | INTRAMUSCULAR | Status: DC | PRN

## 2021-09-17 MED ORDER — PROPOFOL 10 MG/ML IV BOLUS
INTRAVENOUS | Status: AC
  Filled 2021-09-17: qty 20

## 2021-09-17 MED ORDER — POTASSIUM CHLORIDE CRYS ER 20 MEQ PO TBCR: 20.0000 meq | EXTENDED_RELEASE_TABLET | Freq: Every day | ORAL | Status: DC | PRN

## 2021-09-17 MED ORDER — DOCUSATE SODIUM 100 MG PO CAPS
100.0000 mg | ORAL_CAPSULE | Freq: Every day | ORAL | Status: DC
  Administered 2021-09-18 – 2021-09-19 (×2): 100 mg via ORAL
  Filled 2021-09-17 (×3): qty 1

## 2021-09-17 MED ORDER — LIDOCAINE 2% (20 MG/ML) 5 ML SYRINGE
INTRAMUSCULAR | Status: DC | PRN
  Administered 2021-09-17: 60 mg via INTRAVENOUS

## 2021-09-17 MED ORDER — FENTANYL CITRATE (PF) 250 MCG/5ML IJ SOLN
INTRAMUSCULAR | Status: AC
  Filled 2021-09-17: qty 5

## 2021-09-17 MED ORDER — ZINC SULFATE 220 (50 ZN) MG PO CAPS
220.0000 mg | ORAL_CAPSULE | Freq: Every day | ORAL | Status: DC
  Administered 2021-09-18 – 2021-09-19 (×2): 220 mg via ORAL
  Filled 2021-09-17 (×3): qty 1

## 2021-09-17 MED ORDER — ALUM & MAG HYDROXIDE-SIMETH 200-200-20 MG/5ML PO SUSP: 15.0000 mL | ORAL | Status: DC | PRN

## 2021-09-17 MED ORDER — POLYETHYLENE GLYCOL 3350 17 G PO PACK: 17.0000 g | PACK | Freq: Every day | ORAL | Status: DC | PRN

## 2021-09-17 MED ORDER — MAGNESIUM SULFATE 2 GM/50ML IV SOLN: 2.0000 g | Freq: Every day | INTRAVENOUS | Status: DC | PRN

## 2021-09-17 MED ORDER — CHLORHEXIDINE GLUCONATE 4 % EX LIQD
60.0000 mL | Freq: Once | CUTANEOUS | Status: DC
  Filled 2021-09-17: qty 60

## 2021-09-17 MED ORDER — INSULIN ASPART 100 UNIT/ML IJ SOLN: 0.0000 [IU] | INTRAMUSCULAR | Status: DC | PRN

## 2021-09-17 MED ORDER — BUPIVACAINE HCL (PF) 0.25 % IJ SOLN
INTRAMUSCULAR | Status: AC
  Filled 2021-09-17: qty 30

## 2021-09-17 MED ORDER — BISACODYL 5 MG PO TBEC: 5.0000 mg | DELAYED_RELEASE_TABLET | Freq: Every day | ORAL | Status: DC | PRN

## 2021-09-17 MED ORDER — CHLORHEXIDINE GLUCONATE 0.12 % MT SOLN: 15.0000 mL | Freq: Once | OROMUCOSAL | Status: AC

## 2021-09-17 MED ORDER — SODIUM CHLORIDE 0.9 % IV SOLN: INTRAVENOUS | Status: DC

## 2021-09-17 MED ORDER — HYDROMORPHONE HCL 1 MG/ML IJ SOLN
INTRAMUSCULAR | Status: AC
  Filled 2021-09-17: qty 0.5

## 2021-09-17 MED ORDER — MIDAZOLAM HCL 2 MG/2ML IJ SOLN
INTRAMUSCULAR | Status: AC
  Filled 2021-09-17: qty 2

## 2021-09-17 MED ORDER — ACETAMINOPHEN 325 MG PO TABS: 325.0000 mg | ORAL_TABLET | Freq: Four times a day (QID) | ORAL | Status: DC | PRN

## 2021-09-17 MED ORDER — OXYCODONE HCL 5 MG/5ML PO SOLN: 5.0000 mg | Freq: Once | ORAL | Status: DC | PRN

## 2021-09-17 MED ORDER — ORAL CARE MOUTH RINSE: 15.0000 mL | Freq: Once | OROMUCOSAL | Status: AC

## 2021-09-17 MED ORDER — POVIDONE-IODINE 10 % EX SWAB
2.0000 "application " | Freq: Once | CUTANEOUS | Status: AC
  Administered 2021-09-17: 2 via TOPICAL

## 2021-09-17 MED ORDER — OXYCODONE HCL 5 MG PO TABS
5.0000 mg | ORAL_TABLET | ORAL | Status: DC | PRN
  Administered 2021-09-17 – 2021-09-18 (×3): 10 mg via ORAL
  Filled 2021-09-17 (×3): qty 2

## 2021-09-17 MED ORDER — EPHEDRINE SULFATE-NACL 50-0.9 MG/10ML-% IV SOSY
PREFILLED_SYRINGE | INTRAVENOUS | Status: DC | PRN
  Administered 2021-09-17 (×2): 10 mg via INTRAVENOUS
  Administered 2021-09-17: 5 mg via INTRAVENOUS

## 2021-09-17 MED ORDER — GUAIFENESIN-DM 100-10 MG/5ML PO SYRP: 15.0000 mL | ORAL_SOLUTION | ORAL | Status: DC | PRN

## 2021-09-17 MED ORDER — LIDOCAINE 2% (20 MG/ML) 5 ML SYRINGE
INTRAMUSCULAR | Status: AC
  Filled 2021-09-17: qty 5

## 2021-09-17 MED ORDER — AMISULPRIDE (ANTIEMETIC) 5 MG/2ML IV SOLN: 10.0000 mg | Freq: Once | INTRAVENOUS | Status: DC | PRN

## 2021-09-17 MED ORDER — METOPROLOL TARTRATE 5 MG/5ML IV SOLN: 2.0000 mg | INTRAVENOUS | Status: DC | PRN

## 2021-09-17 MED ORDER — CHLORHEXIDINE GLUCONATE 0.12 % MT SOLN
OROMUCOSAL | Status: AC
  Administered 2021-09-17: 15 mL via OROMUCOSAL
  Filled 2021-09-17: qty 15

## 2021-09-17 MED ORDER — MIDAZOLAM HCL 2 MG/2ML IJ SOLN
INTRAMUSCULAR | Status: DC | PRN
  Administered 2021-09-17: 2 mg via INTRAVENOUS

## 2021-09-17 MED ORDER — EPHEDRINE 5 MG/ML INJ
INTRAVENOUS | Status: AC
  Filled 2021-09-17: qty 5

## 2021-09-17 MED ORDER — CEFAZOLIN SODIUM 1 G IJ SOLR
INTRAMUSCULAR | Status: AC
  Filled 2021-09-17: qty 10

## 2021-09-17 MED ORDER — OXYCODONE HCL 5 MG PO TABS: 5.0000 mg | ORAL_TABLET | Freq: Once | ORAL | Status: DC | PRN

## 2021-09-17 MED ORDER — ONDANSETRON HCL 4 MG/2ML IJ SOLN
INTRAMUSCULAR | Status: AC
  Filled 2021-09-17: qty 2

## 2021-09-17 MED ORDER — ONDANSETRON HCL 4 MG/2ML IJ SOLN: 4.0000 mg | Freq: Four times a day (QID) | INTRAMUSCULAR | Status: DC | PRN

## 2021-09-17 MED ORDER — PROMETHAZINE HCL 25 MG/ML IJ SOLN: 6.2500 mg | INTRAMUSCULAR | Status: DC | PRN

## 2021-09-17 MED ORDER — LABETALOL HCL 5 MG/ML IV SOLN: 10.0000 mg | INTRAVENOUS | Status: DC | PRN

## 2021-09-17 MED ORDER — MAGNESIUM CITRATE PO SOLN: 1.0000 | Freq: Once | ORAL | Status: DC | PRN

## 2021-09-17 MED ORDER — 0.9 % SODIUM CHLORIDE (POUR BTL) OPTIME
TOPICAL | Status: DC | PRN
  Administered 2021-09-17: 1000 mL

## 2021-09-17 MED ORDER — ASCORBIC ACID 500 MG PO TABS
1000.0000 mg | ORAL_TABLET | Freq: Every day | ORAL | Status: DC
  Administered 2021-09-18 – 2021-09-19 (×2): 1000 mg via ORAL
  Filled 2021-09-17 (×3): qty 2

## 2021-09-17 MED ORDER — PANTOPRAZOLE SODIUM 40 MG PO TBEC
40.0000 mg | DELAYED_RELEASE_TABLET | Freq: Every day | ORAL | Status: DC
  Administered 2021-09-18 – 2021-09-19 (×2): 40 mg via ORAL
  Filled 2021-09-17 (×3): qty 1

## 2021-09-17 MED ORDER — ONDANSETRON HCL 4 MG/2ML IJ SOLN
INTRAMUSCULAR | Status: DC | PRN
  Administered 2021-09-17: 4 mg via INTRAVENOUS

## 2021-09-17 MED ORDER — HYDROMORPHONE HCL 1 MG/ML IJ SOLN
INTRAMUSCULAR | Status: DC | PRN
  Administered 2021-09-17: .5 mg via INTRAVENOUS

## 2021-09-17 MED ORDER — PROPOFOL 10 MG/ML IV BOLUS
INTRAVENOUS | Status: DC | PRN
  Administered 2021-09-17: 300 mg via INTRAVENOUS
  Administered 2021-09-17: 100 mg via INTRAVENOUS

## 2021-09-17 MED ORDER — CEFAZOLIN SODIUM-DEXTROSE 1-4 GM/50ML-% IV SOLN
INTRAVENOUS | Status: DC | PRN
  Administered 2021-09-17: 1 g via INTRAVENOUS

## 2021-09-17 MED ORDER — CEFAZOLIN SODIUM-DEXTROSE 2-4 GM/100ML-% IV SOLN
2.0000 g | Freq: Three times a day (TID) | INTRAVENOUS | Status: AC
  Administered 2021-09-17 – 2021-09-18 (×2): 2 g via INTRAVENOUS
  Filled 2021-09-17 (×2): qty 100

## 2021-09-17 MED ORDER — BUPIVACAINE-EPINEPHRINE (PF) 0.25% -1:200000 IJ SOLN
INTRAMUSCULAR | Status: DC | PRN
  Administered 2021-09-17: 10 mL

## 2021-09-17 MED ORDER — PHENOL 1.4 % MT LIQD: 1.0000 | OROMUCOSAL | Status: DC | PRN

## 2021-09-17 SURGICAL SUPPLY — 34 items
BAG COUNTER SPONGE SURGICOUNT (BAG) ×2 IMPLANT
BLADE SAGITTAL (BLADE) ×1
BLADE SAW THK.89X75X18XSGTL (BLADE) IMPLANT
BLADE SURG 21 STRL SS (BLADE) ×2 IMPLANT
BNDG COHESIVE 4X5 TAN ST LF (GAUZE/BANDAGES/DRESSINGS) ×1 IMPLANT
BNDG COHESIVE 4X5 TAN STRL (GAUZE/BANDAGES/DRESSINGS) ×2 IMPLANT
BNDG ESMARK 4X9 LF (GAUZE/BANDAGES/DRESSINGS) IMPLANT
BNDG GAUZE ELAST 4 BULKY (GAUZE/BANDAGES/DRESSINGS) ×2 IMPLANT
COVER SURGICAL LIGHT HANDLE (MISCELLANEOUS) ×4 IMPLANT
DRAPE DERMATAC (DRAPES) ×2 IMPLANT
DRAPE U-SHAPE 47X51 STRL (DRAPES) ×2 IMPLANT
DRSG ADAPTIC 3X8 NADH LF (GAUZE/BANDAGES/DRESSINGS) ×1 IMPLANT
DRSG PAD ABDOMINAL 8X10 ST (GAUZE/BANDAGES/DRESSINGS) ×2 IMPLANT
DURAPREP 26ML APPLICATOR (WOUND CARE) ×2 IMPLANT
ELECT REM PT RETURN 9FT ADLT (ELECTROSURGICAL) ×2
ELECTRODE REM PT RTRN 9FT ADLT (ELECTROSURGICAL) ×1 IMPLANT
GAUZE PAD ABD 8X10 STRL (GAUZE/BANDAGES/DRESSINGS) ×1 IMPLANT
GAUZE SPONGE 4X4 12PLY STRL (GAUZE/BANDAGES/DRESSINGS) ×1 IMPLANT
GLOVE BIOGEL PI IND STRL 9 (GLOVE) ×1 IMPLANT
GLOVE BIOGEL PI INDICATOR 9 (GLOVE) ×1
GLOVE SURG ORTHO 9.0 STRL STRW (GLOVE) ×2 IMPLANT
GOWN STRL REUS W/ TWL XL LVL3 (GOWN DISPOSABLE) ×2 IMPLANT
GOWN STRL REUS W/TWL XL LVL3 (GOWN DISPOSABLE) ×2
KIT BASIN OR (CUSTOM PROCEDURE TRAY) ×2 IMPLANT
KIT DRSG PREVENA PLUS 7DAY 125 (MISCELLANEOUS) ×1 IMPLANT
KIT TURNOVER KIT B (KITS) ×2 IMPLANT
MANIFOLD NEPTUNE II (INSTRUMENTS) ×2 IMPLANT
NEEDLE 22X1 1/2 (OR ONLY) (NEEDLE) IMPLANT
NS IRRIG 1000ML POUR BTL (IV SOLUTION) ×2 IMPLANT
PACK ORTHO EXTREMITY (CUSTOM PROCEDURE TRAY) ×2 IMPLANT
PAD ARMBOARD 7.5X6 YLW CONV (MISCELLANEOUS) ×4 IMPLANT
SUT ETHILON 2 0 PSLX (SUTURE) ×2 IMPLANT
SYR CONTROL 10ML LL (SYRINGE) IMPLANT
TOWEL GREEN STERILE (TOWEL DISPOSABLE) ×2 IMPLANT

## 2021-09-17 NOTE — Anesthesia Procedure Notes (Signed)
Procedure Name: LMA Insertion Date/Time: 09/17/2021 2:33 PM Performed by: Cathren Harsh, CRNA Pre-anesthesia Checklist: Patient identified, Emergency Drugs available, Suction available and Patient being monitored Patient Re-evaluated:Patient Re-evaluated prior to induction Oxygen Delivery Method: Circle System Utilized Preoxygenation: Pre-oxygenation with 100% oxygen Induction Type: IV induction Ventilation: Mask ventilation without difficulty LMA: LMA inserted LMA Size: 5.0 Number of attempts: 1 Airway Equipment and Method: Bite block Placement Confirmation: positive ETCO2 Tube secured with: Tape Dental Injury: Teeth and Oropharynx as per pre-operative assessment

## 2021-09-17 NOTE — Transfer of Care (Signed)
Immediate Anesthesia Transfer of Care Note  Patient: [name]  Procedure(s) Performed: First ray amputation (Right)  Patient Location: PACU  Anesthesia Type:General  Level of Consciousness: awake, drowsy, patient cooperative and responds to stimulation  Airway & Oxygen Therapy: Patient Spontanous Breathing and Patient connected to face mask oxygen  Post-op Assessment: Report given to RN and Post -op Vital signs reviewed and stable  Post vital signs: Reviewed and stable  Last Vitals:  Vitals Value Taken Time  BP 127/73 09/17/21 1511  Temp    Pulse 73 09/17/21 1511  Resp 22 09/17/21 1511  SpO2 95 % 09/17/21 1511  Vitals shown include unvalidated device data.  Last Pain:  Vitals:   09/17/21 1157  TempSrc:   PainSc: 0-No pain      Patients Stated Pain Goal: 2 (09/16/21 2108)  Complications: No notable events documented.

## 2021-09-17 NOTE — Progress Notes (Signed)
Subjective:  ON: NAE  The patient was seen at bedside during rounds this AM. Endorses leg pain and foot pain still. Dr. Sharol Given has seen patient and plan for toe amputation today. He is NPO since midnight. He has no other complaints or concerns today.  Objective:  Vital signs in last 24 hours: Vitals:   09/16/21 1634 09/16/21 2028 09/17/21 0332 09/17/21 0753  BP: (!) 148/94 (!) 159/91 139/69 (!) 147/93  Pulse: 66 69 72 68  Resp: 18 18 19 18   Temp: 99.3 F (37.4 C) 97.9 F (36.6 C) 98.9 F (37.2 C) 98.9 F (37.2 C)  TempSrc: Oral Oral Oral Oral  SpO2: 95% 96% 98% 97%   General: NAD, nl appearance HE: Normocephalic, atraumatic, EOMI, Conjunctivae normal ENT: No congestion, no rhinorrhea, no exudate or erythema  Cardiovascular: Normal rate, regular rhythm. No murmurs, rubs, or gallops Pulmonary: Effort normal, breath sounds normal. No wheezes, rales, or rhonchi Abdominal: soft, nontender, bowel sounds present Musculoskeletal: no swelling, deformity, injury. TTP along the medial aspect of the RLE. NVI. Skin: Warm, dry. There is a small amount of drainage on patient's sock this morning. Psychiatric/Behavioral: normal mood, normal behavior     Assessment/Plan:  Principal Problem:   Leg pain, medial, right Active Problems:   Cellulitis of right foot   Osteomyelitis of great toe of right foot (HCC)  #Cellulitis and osteomyelitis of the right great toe #Cellulitis of the right lower leg #Foot ulcer The patient presented here with cellulitis of the right great toe with mild improvement with Augmentin as outpatient. Since admit here, he has been afebrile and hemodynamically stable though has a persistent leukocytosis, most recently ~ 18.  He now has right foot MRI findings consistent with osteomyelitis.  Plan is for great toe amputation later today. Will continue IV antibiotics given presence of moderate superficial soft tissue swelling of the mid to distal medial lower  leg. -Orthopedics is on board, appreciate their recs -Continue IV vancomycin and Unasyn -Blood cultures show NGTD -WOC consult, appreciate recommendations -Continue oxycodone 5 mg q4h PRN -Tylenol PRN  #AKI on CKD stage IIIb, improved Baseline creatinine of 1.7-2. Follows with nephrology in [place]. Cr on admit here was 2.40, most recently at 2.1. FENa of 1.1%, c/w intrinsic AKI though UA was unremarkable. Pt appears euvolemic on exam. Will CTM. -Trend BMP -Avoid nephrotoxic medications  #T2DM Most recent A1c of 10.1 during this admission. He is on glimepiride and jardiance at home. Glucose levels have been ranging in the low to high 100s on the below regimen. -Continue semglee 12U qhs -Continue jardiance 10 mg daily -SSI with meals -CBG monitoring  #HTN The patient is on Entresto, bidil, spiro, and metoprolol at home. Pt has been hypertensive most recently with systolics ranging in the [id number]. -Continue home metoprolol and bidil -Continue home entresto  #Systolic CHF Followed by Dr. Haroldine Laws. Last echo from March of this year showed EF 55%. Pt states he has not been on any diuretics at home but does take entresto, spiro, metoprolol, and jardiance. Patient appears euvolemic on exam today. -Continue home metoprolol -Continue home jardiance -Continue entresto -Strict I/O's -Daily weights -Trend BMP  #Paroxysmal atrial fibrillation He is on amiodarone and metoprolol for rate control and on eliquis for anticoagulation. He underwent DCCV in October 2022 and last March 2023. -Continue home regimen  #HLD The patient is on atorvastatin 20 mg daily at home. -Continue home regimen  Prior to Admission Living Arrangement: Home Anticipated Discharge Location: Home Barriers to Discharge:  Surgery Dispo: Anticipated discharge pending surgery   Orvis Brill, MD 09/17/2021, 10:34 AM Pager: [phone]  After 5pm on weekdays and 1pm on weekends: On Call pager  [phone]

## 2021-09-17 NOTE — Anesthesia Preprocedure Evaluation (Signed)
Anesthesia Evaluation  Patient identified by MRN, date of birth, ID band Patient awake    Reviewed: Allergy & Precautions, NPO status , Patient's Chart, lab work & pertinent test results  History of Anesthesia Complications Negative for: history of anesthetic complications  Airway Mallampati: III  TM Distance: >3 FB Neck ROM: Full    Dental  (+) Teeth Intact   Pulmonary neg pulmonary ROS,    Pulmonary exam normal        Cardiovascular hypertension, Pt. on medications +CHF  Normal cardiovascular exam+ dysrhythmias Atrial Fibrillation    Echo 07/02/21: EF 55%, no RWMA, mild LV dilation, severe LVH, mildly reduced RVSF, severe LAE, mild RAE, mild-mod TR, AV sclerosis w/o stenosis, mod PR   Neuro/Psych CVA    GI/Hepatic negative GI ROS, Neg liver ROS,   Endo/Other  diabetesMorbid obesity  Renal/GU CRFRenal disease  negative genitourinary   Musculoskeletal negative musculoskeletal ROS (+)   Abdominal (+) + obese,   Peds  Hematology negative hematology ROS (+)   Anesthesia Other Findings   Reproductive/Obstetrics                             Anesthesia Physical  Anesthesia Plan  ASA: 3  Anesthesia Plan: General   Post-op Pain Management: Minimal or no pain anticipated and Dilaudid IV   Induction: Intravenous  PONV Risk Score and Plan: 2 and Treatment may vary due to age or medical condition, Ondansetron and Midazolam  Airway Management Planned: LMA  Additional Equipment: None  Intra-op Plan:   Post-operative Plan: Extubation in OR  Informed Consent: I have reviewed the patients History and Physical, chart, labs and discussed the procedure including the risks, benefits and alternatives for the proposed anesthesia with the patient or authorized representative who has indicated his/her understanding and acceptance.       Plan Discussed with:   Anesthesia Plan Comments:          Anesthesia Quick Evaluation

## 2021-09-17 NOTE — Anesthesia Postprocedure Evaluation (Signed)
Anesthesia Post Note  Patient: Randy [name]  Procedure(s) Performed: First ray amputation (Right)     Patient location during evaluation: PACU Anesthesia Type: General Level of consciousness: awake and alert, oriented and patient cooperative Pain management: pain level controlled Vital Signs Assessment: post-procedure vital signs reviewed and stable Respiratory status: spontaneous breathing, nonlabored ventilation and respiratory function stable Cardiovascular status: blood pressure returned to baseline and stable Postop Assessment: no apparent nausea or vomiting Anesthetic complications: no   No notable events documented.  Last Vitals:  Vitals:   09/17/21 1541 09/17/21 1555  BP: (!) 151/87 (!) 158/85  Pulse: 69 66  Resp: (!) 27 19  Temp:  37.1 C  SpO2: 98% 94%    Last Pain:  Vitals:   09/17/21 1541  TempSrc:   PainSc: 0-No pain                 Lannie Fields

## 2021-09-17 NOTE — Progress Notes (Signed)
Mobility Specialist Progress Note:   09/17/21 1030  Mobility  Activity  (bed level exercises)  Range of Motion/Exercises Active;All extremities  Level of Assistance Independent  Activity Response Tolerated well  $Mobility charge 1 Mobility   Pt agreeable to mobility session, politely declining OOB mobility at this time d/t pain. Agreeable to bed level exercises. Pt tolerated well. Left in bed with all needs met.   Randy [name] [facility] Secure Chat or Office Phone: [phone]

## 2021-09-17 NOTE — Consult Note (Signed)
ORTHOPAEDIC CONSULTATION  REQUESTING PHYSICIAN: Campbell Riches, MD  Chief Complaint: Osteomyelitis ulceration right great toe  HPI: Randy [name] is a 51 y.o. male who presents with uncontrolled type 2 diabetes with chronic kidney disease with cellulitis swelling ulceration of the right great toe.  Past Medical History:  Diagnosis Date   CKD (chronic kidney disease) stage 3, GFR 30-59 ml/min (HCC)    baseline Cr 1.5-1.7   Decreased visual acuity    Left eye, resolved - hypertensive retinopathy   Diabetes mellitus 2012   dx hospitalization with A1c 6.6%   Hilar adenopathy    on CT scan 02/2010, on rpt scan stable/improved.   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   Systolic murmur    Vitamin D deficiency    Past Surgical History:  Procedure Laterality Date   CARDIOVERSION N/A 01/31/2021   Procedure: CARDIOVERSION;  Surgeon: Jolaine Artist, MD;  Location: [place];  Service: Cardiovascular;  Laterality: N/A;   CARDIOVERSION N/A 07/04/2021   Procedure: CARDIOVERSION;  Surgeon: Jolaine Artist, MD;  Location: [facility] ENDOSCOPY;  Service: Cardiovascular;  Laterality: N/A;   hospitalization  11/2008   malignant HTN, nl SPEP/UPEP, neg ANCA panel, nl C3/4, neg anti GBM Ab, neg Hep A/B/C, nl renal US, nl PTH, neg HIV   RIGHT/LEFT HEART CATH AND CORONARY ANGIOGRAPHY N/A 01/29/2021   Procedure: RIGHT/LEFT HEART CATH AND CORONARY ANGIOGRAPHY;  Surgeon: Jolaine Artist, MD;  Location: [place] [facility];  Service: Cardiovascular;  Laterality: N/A;   TEE WITHOUT CARDIOVERSION N/A 01/31/2021   Procedure: TRANSESOPHAGEAL ECHOCARDIOGRAM (TEE);  Surgeon: Jolaine Artist, MD;  Location:  [facility] ENDOSCOPY;  Service: Cardiovascular;  Laterality: N/A;   US ECHOCARDIOGRAPHY  11/2008   LVsys fxn EF 50%, mild MR, normal LV size, neg ANA, neg cryoglobulins   US ECHOCARDIOGRAPHY  2011   severe LVH, EF 40%, LA mildly dilated, PA pressure moderately increased   Social History   Socioeconomic History   Marital status: Married    Spouse name: [name]   Number of children: 3   Years of education: Not on file   Highest education level: Bachelor's degree (e.g., BA, AB, BS)  Occupational History   Occupation: Tax Forensic psychologist    Comment: [employer]  Tobacco Use   Smoking status: Never   Smokeless tobacco: Never   Tobacco comments:    monthly, when going out  Vaping Use   Vaping Use: Never used  Substance and Sexual Activity   Alcohol use: Yes    Comment: Occasional   Drug use: No   Sexual activity: Not on file  Other Topics Concern   Not on file  Social History Narrative   Newly married, 2012, 1 daughter, 1 son   No injectable steroid cycles   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, 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)   Regular exercise-yes   Social Determinants of Health   Financial Resource Strain: Low Risk    Difficulty of Paying Living Expenses: Not hard at all  Food Insecurity: No Food Insecurity   Worried About Charity fundraiser in the Last Year:  Never true   Ran Out of Food in the Last Year: Never true  Transportation Needs: No Transportation Needs   Lack of Transportation (Medical): No   Lack of Transportation (Non-Medical): No  Physical Activity: Not on file  Stress: Not on file  Social Connections: Not on file   Family History  Problem Relation Age of Onset   Hypertension Mother    Hypertension Father    Diabetes Father    Hypertension Brother    Diabetes Sister    Coronary artery disease Neg Hx    Stroke Neg Hx    Cancer Neg Hx    Kidney disease Paternal Grandmother        ESRD   - negative except otherwise stated in the family  history section Allergies  Allergen Reactions   Fish-Derived Products Anaphylaxis   Prior to Admission medications   Medication Sig Start Date End Date Taking? Authorizing Provider  amiodarone (PACERONE) 200 MG tablet Take 1 tablet (200 mg total) by mouth 2 (two) times daily. 07/02/21  Yes Bensimhon, Shaune Pascal, MD  amoxicillin-clavulanate (AUGMENTIN) 875-125 MG tablet Take 1 tablet by mouth every 12 (twelve) hours. 09/09/21  Yes Hagler, Aaron Edelman, MD  apixaban (ELIQUIS) 5 MG TABS tablet Take 1 tablet (5 mg total) by mouth 2 (two) times daily. 04/16/21  Yes Clegg, Amy D, NP  atorvastatin (LIPITOR) 20 MG tablet Take 1 tablet (20 mg total) by mouth at bedtime. 04/16/21  Yes Clegg, Amy D, NP  empagliflozin (JARDIANCE) 10 MG TABS tablet Take 1 tablet (10 mg total) by mouth daily. Patient taking differently: Take 10 mg by mouth every morning. 04/16/21  Yes Clegg, Amy D, NP  furosemide (LASIX) 80 MG tablet Take 1 tablet (80 mg total) by mouth daily. Patient taking differently: Take 80 mg by mouth every morning. 07/02/21  Yes Bensimhon, Shaune Pascal, MD  isosorbide-hydrALAZINE (BIDIL) 20-37.5 MG tablet Take 2 tablets by mouth 3 (three) times daily.   Yes [provider]  metoprolol succinate (TOPROL-XL) 100 MG 24 hr tablet Take 1 tablet (100 mg total) by mouth daily. Take with or immediately following a meal. Patient taking differently: Take 100 mg by mouth every morning. Take with or immediately following a meal. 04/16/21 11/13/21 Yes Clegg, Amy D, NP  OVER THE COUNTER MEDICATION Take 15 mLs by mouth every morning. Sea moss gel   Yes [provider]  potassium chloride SA (KLOR-CON M20) 20 MEQ tablet TAKE 2 TABLETS BY MOUTH ONLY WHEN TAKING METOLAZONE Patient taking differently: 20 mEq 2 (two) times daily. 09/01/21  Yes Bensimhon, Shaune Pascal, MD  sacubitril-valsartan (ENTRESTO) 97-103 MG Take 1 tablet by mouth 2 (two) times daily. 04/16/21  Yes Clegg, Amy D, NP  latanoprost (XALATAN) 0.005 % ophthalmic  solution Place 1 drop into both eyes at bedtime. Patient not taking: Reported on 07/02/2021 01/12/21   [provider]  mupirocin ointment (BACTROBAN) 2 % Apply 1 application. topically 2 (two) times daily. 09/09/21   Vanessa Kick, MD  predniSONE (DELTASONE) 20 MG tablet Take 2 tablets (40 mg total) by mouth daily. Patient not taking: Reported on 09/14/2021 09/09/21   Vanessa Kick, MD  spironolactone (ALDACTONE) 25 MG tablet Take 1 tablet (25 mg total) by mouth daily. Patient not taking: Reported on 09/14/2021 04/16/21 07/15/21  Conrad [place], NP   MR TIBIA FIBULA RIGHT WO CONTRAST  Result Date: 09/16/2021 CLINICAL DATA:  Severe right calf pain. EXAM: MRI OF LOWER RIGHT EXTREMITY WITHOUT CONTRAST TECHNIQUE: Multiplanar, multisequence MR imaging  of the right lower leg was performed. No intravenous contrast was administered. COMPARISON:  None Available. FINDINGS: Bones/Joint/Cartilage No marrow signal abnormality. No fracture or dislocation. Joint spaces are preserved. No ankle joint effusion. Ligaments Collateral ligaments are intact. Muscles and Tendons Intact. Small amount of fascial fluid in the flexor and extensor muscle compartments of the right lower leg. No significant muscle edema or atrophy. Soft tissue Moderate superficial soft tissue swelling the mid to distal medial lower leg no fluid collection or hematoma. 1.1 x 1.8 x 2.3 cm ganglion cyst posterior to the posterior talofibular ligament (series 11, image 8). IMPRESSION: 1. Moderate superficial soft tissue swelling of the mid to distal medial lower leg, nonspecific. Correlate for cellulitis. No abscess or osteomyelitis. 2. Mild fasciitis involving the right lower leg flexor and extensor muscle compartments of the right lower leg, without significant myositis. 3. 2.3 cm ganglion cyst posterior to the posterior talofibular ligament. Electronically Signed   By: Titus Dubin M.D.   On: 09/16/2021 08:26   MR FOOT RIGHT WO CONTRAST  Result  Date: 09/15/2021 CLINICAL DATA:  Foot swelling, diabetic, osteomyelitis suspected. EXAM: MRI OF THE RIGHT FOREFOOT WITHOUT CONTRAST TECHNIQUE: Multiplanar, multisequence MR imaging of the right forefoot was performed. No intravenous contrast was administered. COMPARISON:  Radiographs dated Sep 14, 2021 FINDINGS: Bones/Joint/Cartilage Bone marrow edema of the proximal and distal phalanx of the first digit. Normal alignment. No joint effusion. No evidence of fracture or dislocation. Ligaments Collateral ligaments are intact.  Lisfranc ligament is intact. Muscles and Tendons Flexor, peroneal and extensor compartment tendons are intact. Increase intramuscular signal suggesting diabetic myopathy/myositis. No fluid collection or abscess. Soft tissue Marked skin thickening and subcutaneous soft tissue edema prominent about the dorsum of the foot. There is a deep skin wound about the medial aspect of the proximal phalanx of the first digit with surrounding inflammatory changes. No soft tissue mass. IMPRESSION: 1. Bone marrow edema of the proximal and distal phalanges of the first digit which in the presence of adjacent deep skin wound is consistent with osteomyelitis. 2. Marked skin thickening and subcutaneous soft tissue edema about the dorsum of the foot. No drainable fluid collection or abscess. Electronically Signed   By: Keane Police D.O.   On: 09/15/2021 20:19   - pertinent xrays, CT, MRI studies were reviewed and independently interpreted  Positive ROS: All other systems have been reviewed and were otherwise negative with the exception of those mentioned in the HPI and as above.  Physical Exam: General: Alert, no acute distress Psychiatric: Patient is competent for consent with normal mood and affect Lymphatic: No axillary or cervical lymphadenopathy Cardiovascular: No pedal edema Respiratory: No cyanosis, no use of accessory musculature GI: No organomegaly, abdomen is soft and  non-tender    Images:  @ENCIMAGES @  Labs:  Lab Results  Component Value Date   HGBA1C 10.1 (H) 09/14/2021   HGBA1C 6.3 (H) 01/27/2021   HGBA1C 7.4 (H) 08/08/2020   ESRSEDRATE 3 08/07/2020   REPTSTATUS PENDING 09/14/2021   CULT  09/14/2021    NO GROWTH 3 DAYS Performed at [facility], [place] [address] [place] [address]     Lab Results  Component Value Date   ALBUMIN 2.7 (L) 09/15/2021   ALBUMIN 2.9 (L) 09/14/2021   ALBUMIN 3.6 07/02/2021        Latest Ref Rng & Units 09/17/2021    2:35 AM 09/16/2021    3:33 AM 09/15/2021    1:15 AM  CBC EXTENDED  WBC 4.0 - 10.5 K/uL 18.3   20.5   20.3    RBC 4.22 - 5.81 MIL/uL 4.79   4.99   5.04    Hemoglobin 13.0 - 17.0 g/dL 12.6   13.2   13.2    HCT 39.0 - 52.0 % 39.6   40.8   42.1    Platelets 150 - 400 K/uL 309   331   322    NEUT# 1.7 - 7.7 K/uL   14.6    Lymph# 0.7 - 4.0 K/uL   3.6      Neurologic: Patient does not have protective sensation bilateral lower extremities.   MUSCULOSKELETAL:   Skin: Examination patient has sausage digit swelling of the right great toe with cellulitis ulceration drainage.  Patient has a palpable dorsalis pedis pulse.  He does have swelling that extends from the foot up to the leg.  Review of the MRI scan shows osteomyelitis of the right great toe.  White cell count still elevated at 18.3.  Hemoglobin 12.6.  Albumin 2.7.  Hemoglobin A1c 10.1.  Calf is soft nontender.  Assessment: Assessment: Diabetic insensate neuropathy with osteomyelitis and abscess right great toe.  Plan: We will plan for amputation of the right great toe today.  Risk and benefits were discussed including persistent infection nonhealing the wound need for additional surgery.  Patient states he understands wished to proceed at this time.  Thank you for the consult and the opportunity to see Mr. [place], MD [place] [phone] 9:18 AM

## 2021-09-17 NOTE — Plan of Care (Signed)
  Problem: Clinical Measurements: Goal: Cardiovascular complication will be avoided Outcome: Not Progressing   Problem: Clinical Measurements: Goal: Diagnostic test results will improve Outcome: Not Progressing   Problem: Coping: Goal: Level of anxiety will decrease Outcome: Not Progressing   Problem: Pain Managment: Goal: General experience of comfort will improve Outcome: Not Progressing

## 2021-09-17 NOTE — Progress Notes (Signed)
PT Cancellation Note  Patient Details Name: Randy [name] MRN: [id number] DOB: [birth year]   Cancelled Treatment:    Reason Eval/Treat Not Completed: Patient declined, no reason specified (Pt declines therapy at this time.  Already amb with MS and is going for great toe amputation today.  PT to f/u tomorrow to RE after amputation)  Cammy Sanjurjo A. Wateen Varon, PT, DPT Acute Rehabilitation Services Office: [phone]  Corayma Cashatt A Baljit Liebert 09/17/2021, 10:52 AM

## 2021-09-17 NOTE — Op Note (Signed)
09/17/2021  3:16 PM  PATIENT:  Randy [name]    PRE-OPERATIVE DIAGNOSIS:  osteomyelitis  POST-OPERATIVE DIAGNOSIS:  Same  PROCEDURE:  First ray amputation Local tissue rearrangement for wound closure 7 x 5 cm. Application of Prevena 13 cm wound VAC.  SURGEON:  Newt Minion, MD  PHYSICIAN ASSISTANT:None ANESTHESIA:   General  PREOPERATIVE INDICATIONS:  Randy [name] is a  51 y.o. male with a diagnosis of osteomyelitis who failed conservative measures and elected for surgical management.    The risks benefits and alternatives were discussed with the patient preoperatively including but not limited to the risks of infection, bleeding, nerve injury, cardiopulmonary complications, the need for revision surgery, among others, and the patient was willing to proceed.  OPERATIVE IMPLANTS: Praveena 13 cm  @ENCIMAGES @  OPERATIVE FINDINGS: Patient had an abscess that extended circumferentially around the MTP joint this required a amputation through the first metatarsal.  OPERATIVE PROCEDURE: Patient brought the operating room and underwent a general anesthetic.  After adequate levels anesthesia were obtained patient's right lower extremity was prepped using DuraPrep draped into a sterile field a timeout was called.  Patient underwent local infiltration with 10 cc quarter percent Marcaine plain.  A fishmouth incision was made around the ulcerative tissue and the toe amputated through the MTP joint.  Patient had an abscess and necrotic tissue that extended proximal to the MTP joint.  A oscillating saw was used to resect the first metatarsal through the shaft the sesamoids and metatarsal head were resected in 1 block of tissue infected tissue was resected tissue margins were grossly clean.  The wound was irrigated with normal saline electrocautery was used hemostasis.  Local tissue rearrangement was used to close the wound 7 x 5 cm with 2-0 nylon.  A 13 cm Prevena wound VAC was applied this had a  good suction fit this was overwrapped with Coban.  Patient was extubated taken the PACU in stable condition.   DISCHARGE PLANNING:  Antibiotic duration: Continue antibiotics for 24 hours and then would discharge on oral antibiotics for 2 weeks.  Weightbearing: Touchdown weightbearing on the right  Pain medication: Opioid pathway  Dressing care/ Wound VAC: Continue wound VAC for 1 week  Ambulatory devices: Walker or crutches  Discharge to: Anticipate discharge to home  Follow-up: In the office 1 week post operative.

## 2021-09-18 ENCOUNTER — Encounter (HOSPITAL_COMMUNITY): Payer: 59

## 2021-09-18 ENCOUNTER — Encounter (HOSPITAL_COMMUNITY): Payer: Self-pay | Admitting: Orthopedic Surgery

## 2021-09-18 LAB — GLUCOSE, CAPILLARY
Glucose-Capillary: 132 mg/dL — ABNORMAL HIGH (ref 70–99)
Glucose-Capillary: 170 mg/dL — ABNORMAL HIGH (ref 70–99)
Glucose-Capillary: 154 mg/dL — ABNORMAL HIGH (ref 70–99)
Glucose-Capillary: 122 mg/dL — ABNORMAL HIGH (ref 70–99)

## 2021-09-18 LAB — BASIC METABOLIC PANEL
Anion gap: 7 (ref 5–15)
BUN: 22 mg/dL — ABNORMAL HIGH (ref 6–20)
CO2: 21 mmol/L — ABNORMAL LOW (ref 22–32)
Calcium: 8.2 mg/dL — ABNORMAL LOW (ref 8.9–10.3)
Chloride: 107 mmol/L (ref 98–111)
Creatinine, Ser: 1.96 mg/dL — ABNORMAL HIGH (ref 0.61–1.24)
GFR, Estimated: 41 mL/min — ABNORMAL LOW (ref >60)
Glucose, Bld: 133 mg/dL — ABNORMAL HIGH (ref 70–99)
Potassium: 3.4 mmol/L — ABNORMAL LOW (ref 3.5–5.1)
Sodium: 135 mmol/L (ref 135–145)

## 2021-09-18 LAB — CBC
HCT: 37.6 % — ABNORMAL LOW (ref 39.0–52.0)
Hemoglobin: 11.9 g/dL — ABNORMAL LOW (ref 13.0–17.0)
MCH: 26.2 pg (ref 26.0–34.0)
MCHC: 31.6 g/dL (ref 30.0–36.0)
MCV: 82.6 fL (ref 80.0–100.0)
Platelets: 327 10*3/uL (ref 150–400)
RBC: 4.55 MIL/uL (ref 4.22–5.81)
RDW: 15.3 % (ref 11.5–15.5)
WBC: 15.6 10*3/uL — ABNORMAL HIGH (ref 4.0–10.5)
nRBC: 0 % (ref 0.0–0.2)

## 2021-09-18 MED ORDER — POTASSIUM CHLORIDE CRYS ER 20 MEQ PO TBCR
40.0000 meq | EXTENDED_RELEASE_TABLET | Freq: Once | ORAL | Status: AC
  Administered 2021-09-18: 40 meq via ORAL
  Filled 2021-09-18: qty 2

## 2021-09-18 NOTE — Progress Notes (Signed)
Subjective:  ON: NAE  The patient was seen at bedside during rounds this AM.  Patient states that his leg pain has resolved.  He did well after his surgery yesterday and has no complaints or concerns today.  Objective:  Vital signs in last 24 hours: Vitals:   09/17/21 2012 09/17/21 2326 09/18/21 0520 09/18/21 0801  BP: (!) 157/96 (!) 142/74 132/65 (!) 160/98  Pulse: 70 68 77 71  Resp: 18 18 18 17   Temp: 97.8 F (36.6 C) 98.9 F (37.2 C) 98.5 F (36.9 C) 98.2 F (36.8 C)  TempSrc: Oral Oral Oral   SpO2: 97% 96% 97% 96%  Weight:      Height:       General: NAD, nl appearance HE: Normocephalic, atraumatic, EOMI, Conjunctivae normal ENT: No congestion, no rhinorrhea, no exudate or erythema  Cardiovascular: Normal rate, regular rhythm. No murmurs, rubs, or gallops Pulmonary: Effort normal, breath sounds normal. No wheezes, rales, or rhonchi Abdominal: soft, nontender, bowel sounds present Musculoskeletal: no swelling, deformity, injury.  No tenderness to palpation of the RLE.  Wound VAC is in place and is draining appropriately. Skin: Warm, dry. Psychiatric/Behavioral: normal mood, normal behavior     Assessment/Plan:  Principal Problem:   Leg pain, medial, right Active Problems:   Cellulitis of right foot   Osteomyelitis of great toe of right foot (HCC)   Cutaneous abscess of right foot  #Cellulitis and osteomyelitis of the right great toe, s/p first ray amputation #Cellulitis of the right lower leg #Foot ulcer Patient presented here with cellulitis and osteomyelitis of the right great toe as well as cellulitis of the right lower leg.  He is status post first ray amputation performed on 5/24.  He has been on IV Vanco and Unasyn since admit.  At this time, we will continue IV antibiotics with likely transition to p.o. antibiotics tomorrow.  Plan is to continue antibiotics for total of 7 days given the presence of cellulitis in the right lower leg. -Orthopedics is on  board, appreciate their recs -Continue IV vancomycin and Unasyn -Blood cultures show NGTD -WOC consult, appreciate recommendations -Continue oxycodone 5 mg q4h PRN -Tylenol PRN  #AKI on CKD stage IIIb, improved Baseline creatinine of 1.7-2. Follows with nephrology in [place]. Cr on admit here was 2.40, most recently at 1.96.  FENa of 1.1%, c/w intrinsic AKI though UA was unremarkable. Pt appears euvolemic on exam. Will CTM. -Trend BMP -Avoid nephrotoxic medications  #T2DM Most recent A1c of 10.1 during this admission. He is on glimepiride and jardiance at home. Glucose levels have been well controlled on the below regimen. -Continue semglee 12U qhs -Continue jardiance 10 mg daily -SSI with meals -CBG monitoring  #HTN The patient is on Entresto, bidil, spiro, and metoprolol at home. Pt has been hypertensive most recently with systolics ranging in the [id number]. -Continue home metoprolol and bidil -Continue home entresto  #Systolic CHF Followed by Dr. Haroldine Laws. Last echo from March of this year showed EF 55%. Pt states he has not been on any diuretics at home but does take entresto, spiro, metoprolol, and jardiance. Patient appears euvolemic on exam today. -Continue home metoprolol -Continue home jardiance -Continue entresto -Strict I/O's -Daily weights -Trend BMP  #Paroxysmal atrial fibrillation He is on amiodarone and metoprolol for rate control and on eliquis for anticoagulation. He underwent DCCV in October 2022 and last March 2023. -Continue home regimen  #HLD The patient is on atorvastatin 20 mg daily at home. -Continue home regimen  Prior  to Admission Living Arrangement: Home Anticipated Discharge Location: Home Barriers to Discharge: Postop IV antibiotics Dispo: Anticipated discharge pending postop IV antibiotics, likely tomorrow  Orvis Brill, MD 09/18/2021, 10:56 AM Pager: [phone]  After 5pm on weekdays and 1pm on weekends: On Call pager  [phone]

## 2021-09-18 NOTE — Progress Notes (Signed)
OT Cancellation Note  Patient Details Name: Randy [name] MRN: [id number] DOB: Apr 21, 1971   Cancelled Treatment:    Reason Eval/Treat Not Completed: OT screened, no needs identified, will sign off.  PT is following, and patient will have needed assist at home.  No post acute OT anticipated.    Mozelle Remlinger D Kailynn Satterly 09/18/2021, 5:01 PM

## 2021-09-18 NOTE — Progress Notes (Signed)
Physical Therapy Treatment Patient Details Name: Randy [name] MRN: [id number] DOB: [birth year] Today's Date: 09/18/2021   History of Present Illness Pt is a 51 y/o male who presents with R lower leg pain and a blister on his R great toe. 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 PMH significant for CKD III, DM, Bell's Palsy, HTN, L knee injury, CVA, systolic CHF.    PT Comments    Pt is s/p R 1st great toe amputation. Prior to admission on 09/14/21, pt was independent with bed mobility, transfers, ambulation, ADLs, and IADLs. At the time of treatment, pt demonstrated and verbalized understanding of WB status and proper use of RW with modified independence with ambulation of 75 feet. Pt's goals and plan of care remain appropriate and anticipates going home later this evening. Will continue to follow until D/C.   Recommendations for follow up therapy are one component of a multi-disciplinary discharge planning process, led by the attending physician.  Recommendations may be updated based on patient status, additional functional criteria and insurance authorization.  Follow Up Recommendations  Outpatient PT     Assistance Recommended at Discharge PRN  Patient can return home with the following A little help with walking and/or transfers;Assist for transportation;Help with stairs or ramp for entrance;A little help with bathing/dressing/bathroom   Equipment Recommendations  Rolling walker (2 wheels)    Recommendations for Other Services       Precautions / Restrictions Precautions Precautions: Fall Precaution Comments: Very painful to light touch Restrictions Weight Bearing Restrictions: Yes RLE Weight Bearing: Touchdown weight bearing     Mobility  Bed Mobility Overal bed mobility: Modified Independent Bed Mobility: Supine to Sit, Sit to Supine           General bed mobility comments: No assist required.    Transfers Overall transfer  level: Modified independent Equipment used: Rolling walker (2 wheels)               General transfer comment: Pt able to stand without WB through R without RW with modified independence.    Ambulation/Gait Ambulation/Gait assistance: Modified independent (Device/Increase time)   Assistive device: Rolling walker (2 wheels) Gait Pattern/deviations: Decreased stride length, Step-to pattern (touchdown WB precautions forced step to pattern) Gait velocity: Decreased Gait velocity interpretation: 1.31 - 2.62 ft/sec, indicative of limited community ambulator   General Gait Details: Pt needed cues for maintaining WB status and utilized a RW and educated on keeping it close for safety. Pt limited in distance due to UE and LLE fatigue.   Stairs             Wheelchair Mobility    Modified Rankin (Stroke Patients Only)       Balance Overall balance assessment: Mild deficits observed, not formally tested                                          Cognition Arousal/Alertness: Awake/alert Behavior During Therapy: WFL for tasks assessed/performed Overall Cognitive Status: Within Functional Limits for tasks assessed                                 General Comments: Pt needed cues for WB status throughout ambulation.        Exercises Total Joint Exercises Heel Slides: AROM, Right, 10 reps, Seated Long  Arc Quad: AROM, Right, 10 reps, Seated    General Comments General comments (skin integrity, edema, etc.): wound vac in place, no leaks or notable finding with dressing.      Pertinent Vitals/Pain Pain Assessment Pain Assessment: Faces Faces Pain Scale: Hurts little more Pain Location: in the medial portion of the R calf, no pain in the foot Pain Descriptors / Indicators: Tender, Sore, Grimacing, Guarding, Discomfort Pain Intervention(s): Monitored during session, Limited activity within patient's tolerance    Home Living Family/patient  expects to be discharged to:: Private residence Living Arrangements: Spouse/significant other Available Help at Discharge: Family;Available PRN/intermittently Type of Home: House Home Access: Level entry     Alternate Level Stairs-Number of Steps: "flight and a half" Home Layout: Two level;Bed/bath upstairs Home Equipment: None      Prior Function            PT Goals (current goals can now be found in the care plan section) Acute Rehab PT Goals Patient Stated Goal: Decrease pain, get back to PLOF PT Goal Formulation: With patient Time For Goal Achievement: 09/22/21 Potential to Achieve Goals: Good Progress towards PT goals: Progressing toward goals    Frequency    Min 3X/week      PT Plan Current plan remains appropriate    Co-evaluation              AM-PAC PT "6 Clicks" Mobility   Outcome Measure  Help needed turning from your back to your side while in a flat bed without using bedrails?: None Help needed moving from lying on your back to sitting on the side of a flat bed without using bedrails?: None Help needed moving to and from a bed to a chair (including a wheelchair)?: None Help needed standing up from a chair using your arms (e.g., wheelchair or bedside chair)?: None Help needed to walk in hospital room?: A Little Help needed climbing 3-5 steps with a railing? : None 6 Click Score: 23    End of Session   Activity Tolerance: Patient limited by pain Patient left: in bed;with call bell/phone within reach Nurse Communication: Mobility status PT Visit Diagnosis: Unsteadiness on feet (R26.81);Pain Pain - Right/Left: Right Pain - part of body: Leg;Ankle and joints of foot     Time: [id number] PT Time Calculation (min) (ACUTE ONLY): 28 min  Charges:  $Gait Training: 23-37 mins                     Havery Moros, MS, SPT Acute Services Rehabilitation Office: [place] 09/18/2021, 2:27 PM

## 2021-09-18 NOTE — Progress Notes (Signed)
Mobility Specialist Progress Note:   09/18/21 1040  Mobility  Activity  (STS from Chair)  Level of Assistance Independent  Assistive Device None  Activity Response Tolerated fair  $Mobility charge 1 Mobility  Restrictions  RLE Weight Bearing TWB   Pt received in chair. Performed x1 STS maintaining TWB on RLE. Pt with increased pain in calf upon standing. Further mobility deferred, PT to see today. Left in chair with all needs met.  Nelta Numbers Acute Rehab Secure Chat or Office Phone: [phone]

## 2021-09-18 NOTE — Discharge Summary (Addendum)
Name: Randy [name] MRN: [id number] DOB: 1970/07/08 51 y.o. PCP: [facility] [place]  Date of Admission: 09/14/2021  1:08 PM Date of Discharge: 09/19/2021 Attending Physician: Campbell Riches, MD  Discharge Diagnosis: Osteomyelitis of right great toe s/p toe amputation Cellulitis of the right lower leg AKI on CKD Stage 3B, improved Type 2 Diabetes Mellitus, poorly controlled HTN  Discharge Medications: Allergies as of 09/19/2021       Reactions   Fish-derived Products Anaphylaxis        Medication List     STOP taking these medications    amoxicillin-clavulanate 875-125 MG tablet Commonly known as: AUGMENTIN   furosemide 80 MG tablet Commonly known as: LASIX   latanoprost 0.005 % ophthalmic solution Commonly known as: XALATAN   mupirocin ointment 2 % Commonly known as: BACTROBAN   OVER THE COUNTER MEDICATION   predniSONE 20 MG tablet Commonly known as: DELTASONE       TAKE these medications    amiodarone 200 MG tablet Commonly known as: PACERONE Take 1 tablet (200 mg total) by mouth 2 (two) times daily.   apixaban 5 MG Tabs tablet Commonly known as: ELIQUIS Take 1 tablet (5 mg total) by mouth 2 (two) times daily.   ascorbic acid 1000 MG tablet Commonly known as: VITAMIN C Take 1 tablet (1,000 mg total) by mouth daily. Start taking on: Sep 20, 2021   atorvastatin 20 MG tablet Commonly known as: LIPITOR Take 1 tablet (20 mg total) by mouth at bedtime.   docusate sodium 100 MG capsule Commonly known as: COLACE Take 1 capsule (100 mg total) by mouth daily. Start taking on: Sep 20, 2021   doxycycline 100 MG capsule Commonly known as: VIBRAMYCIN Take 1 capsule (100 mg total) by mouth 2 (two) times daily.   empagliflozin 10 MG Tabs tablet Commonly known as: JARDIANCE Take 1 tablet (10 mg total) by mouth daily. What changed: when to take this   ferrous gluconate 324 MG tablet Commonly known as: FERGON Take 1 tablet (324 mg total) by  mouth daily with breakfast.   isosorbide-hydrALAZINE 20-37.5 MG tablet Commonly known as: BIDIL Take 2 tablets by mouth 3 (three) times daily.   metFORMIN 500 MG 24 hr tablet Commonly known as: GLUCOPHAGE-XR Take 1 tablet (500 mg total) by mouth daily with breakfast.   metoprolol succinate 100 MG 24 hr tablet Commonly known as: TOPROL-XL Take 1 tablet (100 mg total) by mouth daily. Take with or immediately following a meal. What changed: when to take this   nutrition supplement (JUVEN) Pack Take 1 packet by mouth 2 (two) times daily between meals.   oxyCODONE 5 MG immediate release tablet Commonly known as: Oxy IR/ROXICODONE Take 1-2 tablets (5-10 mg total) by mouth every 4 (four) hours as needed for up to 3 days for moderate pain (pain score 4-6).   pantoprazole 40 MG tablet Commonly known as: PROTONIX Take 1 tablet (40 mg total) by mouth daily. Start taking on: Sep 20, 2021   potassium chloride SA 20 MEQ tablet Commonly known as: Klor-Con M20 TAKE 2 TABLETS BY MOUTH ONLY WHEN TAKING METOLAZONE What changed:  how much to take when to take this additional instructions   sacubitril-valsartan 97-103 MG Commonly known as: ENTRESTO Take 1 tablet by mouth 2 (two) times daily.   spironolactone 25 MG tablet Commonly known as: ALDACTONE Take 1 tablet (25 mg total) by mouth daily.   zinc sulfate 220 (50 Zn) MG capsule Take 1 capsule (220 mg total) by  mouth daily. Start taking on: Sep 20, 2021               Durable Medical Equipment  (From admission, onward)           Start     Ordered   09/19/21 1215  DME Gilford Rile  Once       Question Answer Comment  Walker: With 5 Inch Wheels   Patient needs a walker to treat with the following condition Osteomyelitis of right foot, unspecified type ([place])      09/19/21 1222   09/18/21 1653  For home use only DME Walker rolling  Once       Comments: 6'8", extenders  Question Answer Comment  Walker: With [place]    Patient needs a walker to treat with the following condition Amputation of toe ([place])      09/18/21 1654              Discharge Care Instructions  (From admission, onward)           Start     Ordered   09/19/21 0000  Discharge wound care:       Comments: Wound vac to remain in place for 1 week   09/19/21 1222            Disposition and follow-up:   Randy [name] was discharged from [facility] in Good condition.  At the hospital follow up visit please address:  Cellulitis and osteomyelitis, s/p first ray amputation; and cellulitis of right lower leg: Pt was discharged on doxycycline 100 mg BID for two weeks. He will follow-up with Orthopedics as outpatient.   Poorly controlled T2DM: Pt was discharged on metformin 500 mg daily along with his home jardiance. He endorsed preference to start metformin at discharge rather than insulin injections. Please discuss his diabetes at next visit and adjust his regimen if indicated.  Systolic HF, HTN: Restarted on spiro on day of discharge. Systolic BP's were elevated on day of discharge, thought to be 2/2 post op pain. He will follow-up with the heart failure team in the outpatient setting.   Normocytic anemia: Pt presented here with a Hgb of 13, MCV 82.7. Hgb of 11.4 was noted on day of discharge. Ferritin in the 700s, low iron, low TIBC. These labs are similar to those obtained about 7 months ago. Though ferritin is an acute phase reactant, likely AoCD in the setting of CKD as well as a component of blood loss after his surgery here. He was discharged with oral iron supplementation. Please recheck his iron stores at next follow-up.  2.  Labs / imaging needed at time of follow-up: BMP, CBC  3.  Pending labs/ test needing follow-up: None  Follow-up Appointments:  Follow-up Information     Newt Minion, MD Follow up in 1 week(s).   Specialty: Orthopedic Surgery Contact information: [place] [id number] [phone]         [facility] Follow up.   Specialty: Rehabilitation Why: Outpatient PT will call to arrange follow up Contact information: [address] [id number] [place] Follow up on 10/09/2021.   Specialty: Cardiology Why: [facility] 10:30 am Entrance C, Free Physicist, medical information: [address] [id number] [place] [phone]  Hospital Course by problem list:  #Cellulitis and osteomyelitis of the right great toe, s/p first ray amputation on 09/17/21 #Cellulitis of the right lower leg #Foot ulcer Patient initially sustained an injury to his right great toe about 2 weeks ago after moving some furniture.  He subsequently presented to urgent care was started on Augmentin regimen.  He stated that this improved his symptoms, however he developed some right medial calf pain several days later and also noticed a "blood blister" on his right great toe, prompting him to present here.  Right foot films were negative for osteomyelitis, and the patient was afebrile and hemodynamically stable on admit.  Did have leukocytosis at 18 on admit. Blood cultures showed no growth to date. He was treated for cellulitis with IV vanc and unasyn. WOC was consulted. The next day, an MRI of the right foot was obtained showing osteomyelitis of the right great toe.  Orthopedics was consulted for this finding.  Patient also complained of persistent leg pain, so an MRI of the right tib/fib was obtained showing findings consistent with cellulitis.  Orthopedics evaluated the patient and recommended right great toe amputation.  An amputation, right first ray, was performed on 5/24. IV antibiotics were continued for 2 days post op. Pain was well-controlled  on oxy and tylenol following his surgery. He was discharged on doxycycline for 2 weeks for his cellulitis. He will follow-up with Orthopedics in 1 week.   #AKI on CKD stage IIIb, improved Baseline creatinine of 1.7-2. Follows with nephrology in [place]. Cr on admit here was 2.40, most recently at 1.89. FENa of 1.1%, c/w intrinsic AKI though UA was unremarkable. Pt appeared euvolemic on exam while here.  We trended his BMP during his hospitalization, and nephrotoxic medications were avoided during his hospitalization. He is followed by Nephrology in [place] twice per year.   #T2DM Most recent A1c of 10.1 during this admission. He is on glimepiride and jardiance at home. Glucose levels were well controlled on Semglee 12 units nightly, Jardiance 10 mg daily, and sliding scale with meals. He was discharged on metformin 500 mg daily and his home jardiance.   #HTN The patient is on Entresto, bidil, spiro, and metoprolol at home. Pt has been hypertensive most recently with systolics ranging in the [id number].  Patient was continued on home metoprolol, BiDil, and Entresto. Pt did have some elevated BP of [id number] systolic on day of discharge, thought to be 2/2 pain. HF team saw the patient and resumed his home spironolactone. He will follow-up with HF in the outpatient setting.    #Systolic CHF Followed by Dr. Haroldine Laws. Last echo from March of this year showed EF 55%. Pt states he has not been on any diuretics at home but does take entresto, spiro, metoprolol, and jardiance. Patient appears compensated on exam while he was here.  He was continued on his home metoprolol, Jardiance, Entresto.    #Paroxysmal atrial fibrillation He is on amiodarone and metoprolol for rate control and on eliquis for anticoagulation. He underwent DCCV in October 2022 and last March 2023.  His home medication regimen was continued.   #HLD The patient is on atorvastatin 20 mg daily at home.  This was continued during his  hospitalization.  #Normocytic anemia Pt presented here with a Hgb of 13, MCV 82.7. Hgb of 11.4 was noted on day of discharge. Ferritin in the 700s, low iron, low TIBC. These labs are similar to those obtained about 7 months ago. Though ferritin is an acute  phase reactant, likely AoCD in the setting of CKD as well as a component of blood loss after his surgery here. Discharged on oral iron.  Discharge Exam:   BP (!) 166/85 (BP Location: Right Arm)   Pulse 76   Temp 98 F (36.7 C)   Resp 18   Ht 6\' 8"  (2.032 m)   Wt (!) 165.6 kg   SpO2 95%   BMI 40.10 kg/m  Discharge exam:  General: NAD, nl appearance HE: Normocephalic, atraumatic, EOMI, Conjunctivae normal ENT: No congestion, no rhinorrhea, no exudate or erythema  Cardiovascular: Normal rate, regular rhythm. No murmurs, rubs, or gallops Pulmonary: Effort normal, breath sounds normal. No wheezes, rales, or rhonchi Abdominal: soft, nontender, bowel sounds present Musculoskeletal: no swelling, deformity, injury.  RLE: No TTP along the medial calf. Wound vac is in place and is draining appropriately Skin: Warm, dry.  Psychiatric/Behavioral: normal mood, normal behavior     Pertinent Labs, Studies, and Procedures:  MR TIBIA FIBULA RIGHT WO CONTRAST  Result Date: 09/16/2021 CLINICAL DATA:  Severe right calf pain. EXAM: MRI OF LOWER RIGHT EXTREMITY WITHOUT CONTRAST TECHNIQUE: Multiplanar, multisequence MR imaging of the right lower leg was performed. No intravenous contrast was administered. COMPARISON:  None Available. FINDINGS: Bones/Joint/Cartilage No marrow signal abnormality. No fracture or dislocation. Joint spaces are preserved. No ankle joint effusion. Ligaments Collateral ligaments are intact. Muscles and Tendons Intact. Small amount of fascial fluid in the flexor and extensor muscle compartments of the right lower leg. No significant muscle edema or atrophy. Soft tissue Moderate superficial soft tissue swelling the mid to distal  medial lower leg no fluid collection or hematoma. 1.1 x 1.8 x 2.3 cm ganglion cyst posterior to the posterior talofibular ligament (series 11, image 8). IMPRESSION: 1. Moderate superficial soft tissue swelling of the mid to distal medial lower leg, nonspecific. Correlate for cellulitis. No abscess or osteomyelitis. 2. Mild fasciitis involving the right lower leg flexor and extensor muscle compartments of the right lower leg, without significant myositis. 3. 2.3 cm ganglion cyst posterior to the posterior talofibular ligament. Electronically Signed   By: Titus Dubin M.D.   On: 09/16/2021 08:26   MR FOOT RIGHT WO CONTRAST  Result Date: 09/15/2021 CLINICAL DATA:  Foot swelling, diabetic, osteomyelitis suspected. EXAM: MRI OF THE RIGHT FOREFOOT WITHOUT CONTRAST TECHNIQUE: Multiplanar, multisequence MR imaging of the right forefoot was performed. No intravenous contrast was administered. COMPARISON:  Radiographs dated Sep 14, 2021 FINDINGS: Bones/Joint/Cartilage Bone marrow edema of the proximal and distal phalanx of the first digit. Normal alignment. No joint effusion. No evidence of fracture or dislocation. Ligaments Collateral ligaments are intact.  Lisfranc ligament is intact. Muscles and Tendons Flexor, peroneal and extensor compartment tendons are intact. Increase intramuscular signal suggesting diabetic myopathy/myositis. No fluid collection or abscess. Soft tissue Marked skin thickening and subcutaneous soft tissue edema prominent about the dorsum of the foot. There is a deep skin wound about the medial aspect of the proximal phalanx of the first digit with surrounding inflammatory changes. No soft tissue mass. IMPRESSION: 1. Bone marrow edema of the proximal and distal phalanges of the first digit which in the presence of adjacent deep skin wound is consistent with osteomyelitis. 2. Marked skin thickening and subcutaneous soft tissue edema about the dorsum of the foot. No drainable fluid collection or  abscess. Electronically Signed   By: Keane Police D.O.   On: 09/15/2021 20:19   DG Foot Complete Right  Result Date: 09/14/2021 CLINICAL DATA:  Wound involving the  first digit of the right foot. EXAM: RIGHT FOOT COMPLETE - 3+ VIEW COMPARISON:  Ankle radiographs dated 09/28/2011. FINDINGS: There is no evidence of fracture or dislocation. A posterior calcaneal enthesophyte is noted. No focal osseous demineralization to suggest osteomyelitis. There is soft tissue swelling and potentially a soft tissue defect/laceration surrounding the first digit. No radiopaque foreign body. IMPRESSION: No focal osseous demineralization to suggest osteomyelitis. No radiopaque foreign body. Electronically Signed   By: Zerita Boers M.D.   On: 09/14/2021 14:57   VAS [place] LOWER EXTREMITY VENOUS (DVT) (ONLY [facility] & [facility])  Result Date: 09/15/2021  Lower Venous DVT Study Patient Name:  Randy [name]  Date of Exam:   09/14/2021 Medical Rec #: [id number]       Accession #:    [id number] Date of Birth: Dec 02, 1970       Patient Gender: M Patient Age:   38 years Exam Location:  [facility] Procedure:      VAS [place] LOWER EXTREMITY VENOUS (DVT) Referring Phys: Debbe Mounts --------------------------------------------------------------------------------  Indications: Pain, Swelling, and Ulceration of right great toe.  Risk Factors: Diabetes. Atrial fibrillation. Anticoagulation: Eliquis. Comparison Study: No prior study on file Performing Technologist: Sharion Dove RVS  Examination Guidelines: A complete evaluation includes B-mode imaging, spectral Doppler, color Doppler, and power Doppler as needed of all accessible portions of each vessel. Bilateral testing is considered an integral part of a complete examination. Limited examinations for reoccurring indications may be performed as noted. The reflux portion of the exam is performed with the patient in reverse Trendelenburg.   +---------+---------------+---------+-----------+----------+--------------+ RIGHT    CompressibilityPhasicitySpontaneityPropertiesThrombus Aging +---------+---------------+---------+-----------+----------+--------------+ CFV      Full           Yes      Yes                                 +---------+---------------+---------+-----------+----------+--------------+ SFJ      Full                                                        +---------+---------------+---------+-----------+----------+--------------+ FV Prox  Full                                                        +---------+---------------+---------+-----------+----------+--------------+ FV Mid   Full                                                        +---------+---------------+---------+-----------+----------+--------------+ FV DistalFull                                                        +---------+---------------+---------+-----------+----------+--------------+ PFV      Full                                                        +---------+---------------+---------+-----------+----------+--------------+  POP      Full           Yes      Yes                                 +---------+---------------+---------+-----------+----------+--------------+ PTV      Full                                                        +---------+---------------+---------+-----------+----------+--------------+ PERO     Full                                                        +---------+---------------+---------+-----------+----------+--------------+   +----+---------------+---------+-----------+----------+--------------+ LEFTCompressibilityPhasicitySpontaneityPropertiesThrombus Aging +----+---------------+---------+-----------+----------+--------------+ CFV Full           Yes      Yes                                  +----+---------------+---------+-----------+----------+--------------+    Summary: RIGHT: - No evidence of deep vein thrombosis in the lower extremity. No indirect evidence of obstruction proximal to the inguinal ligament. - As patient is diabetic with a great toe ulcer, the PT, AT, and DP were Dopplered indicating mulitphasic waveforms - Ultrasound characteristics of enlarged, reactive lymph nodes are noted in the groin.   *See table(s) above for measurements and observations. Electronically signed by Monica Martinez MD on 09/15/2021 at 5:11:49 PM.    Final        Latest Ref Rng & Units 09/19/2021    4:23 AM 09/18/2021    1:51 AM 09/17/2021    2:35 AM  CBC  WBC 4.0 - 10.5 K/uL 16.4   15.6   18.3    Hemoglobin 13.0 - 17.0 g/dL 11.4   11.9   12.6    Hematocrit 39.0 - 52.0 % 35.6   37.6   39.6    Platelets 150 - 400 K/uL 290   327   309        Latest Ref Rng & Units 09/19/2021    4:23 AM 09/18/2021    1:51 AM 09/17/2021    2:35 AM  BMP  Glucose 70 - 99 mg/dL 112   133   133    BUN 6 - 20 mg/dL 20   22   24     Creatinine 0.61 - 1.24 mg/dL 1.89   1.96   2.10    Sodium 135 - 145 mmol/L 134   135   137    Potassium 3.5 - 5.1 mmol/L 4.0   3.4   3.9    Chloride 98 - 111 mmol/L 109   107   108    CO2 22 - 32 mmol/L 18   21   19     Calcium 8.9 - 10.3 mg/dL 8.2   8.2   8.5     Iron/TIBC/Ferritin/ %Sat    Component Value Date/Time   IRON 15 (L) 09/19/2021 0423   TIBC 153 (L) 09/19/2021 0423   FERRITIN 705 (H) 09/19/2021 0423   IRONPCTSAT  10 (L) 09/19/2021 0423    Discharge Instructions: Discharge Instructions     (HEART FAILURE PATIENTS) Call MD:  Anytime you have any of the following symptoms: 1) 3 pound weight gain in 24 hours or 5 pounds in 1 week 2) shortness of breath, with or without a dry hacking cough 3) swelling in the hands, feet or stomach 4) if you have to sleep on extra pillows at night in order to breathe.   Complete by: As directed    Ambulatory referral to Physical Therapy    Complete by: As directed    Call MD for:  difficulty breathing, headache or visual disturbances   Complete by: As directed    Call MD for:  persistant dizziness or light-headedness   Complete by: As directed    Call MD for:  redness, tenderness, or signs of infection (pain, swelling, redness, odor or green/yellow discharge around incision site)   Complete by: As directed    Call MD for:  severe uncontrolled pain   Complete by: As directed    Call MD for:  temperature >100.4   Complete by: As directed    Discharge wound care:   Complete by: As directed    Wound vac to remain in place for 1 week   Negative Pressure Wound Therapy - Incisional   Complete by: As directed        Signed: Orvis Brill, MD 09/19/2021, 12:22 PM   Pager: [phone]

## 2021-09-18 NOTE — TOC Initial Note (Signed)
Transition of Care [facility]) - Initial/Assessment Note    Patient Details  Name: Randy [name] MRN: [id number] Date of Birth: 12/21/1970  Transition [facility]) CM/SW Contact:    Erenest Rasher, RN Phone Number: [phone] 09/18/2021, 5:00 PM  Clinical Narrative:                 HF TOC CM spoke to pt and states he prefers to come to [place] for Outpt PT. CM notified attending. Bethany for RW with extenders. Will deliver to room prior to dc. Please have meds come up from [place] at dc.   Expected Discharge Plan: OP Rehab Barriers to Discharge: Continued Medical Work up   Patient Goals and CMS Choice Patient states their goals for this hospitalization and ongoing recovery are:: would outpt PT in [facility].gov Compare Post Acute Care list provided to:: Patient Choice offered to / list presented to : Patient  Expected Discharge Plan and Services Expected Discharge Plan: OP Rehab   Discharge Planning Services: CM Consult   Living arrangements for the past 2 months: Single Family Home                 DME Arranged: [place] rolling DME Agency: [employer] Date DME Agency Contacted: 09/18/21 Time DME Agency Contacted: [phone] Representative spoke with at DME Agency: [place]            Prior Living Arrangements/Services Living arrangements for the past 2 months: [place] with:: Spouse Patient language and need for interpreter reviewed:: Yes Do you feel safe going back to the place where you live?: Yes      Need for Family Participation in Patient Care: No (Comment) Care giver support system in place?: No (comment)   Criminal Activity/Legal Involvement Pertinent to Current Situation/Hospitalization: No - Comment as needed  Activities of Daily Living Home Assistive Devices/Equipment: None ADL Screening (condition at time of admission) Patient's cognitive ability adequate to safely complete daily activities?:  Yes Is the patient deaf or have difficulty hearing?: No Does the patient have difficulty seeing, even when wearing glasses/contacts?: No Does the patient have difficulty concentrating, remembering, or making decisions?: No Patient able to express need for assistance with ADLs?: Yes Does the patient have difficulty dressing or bathing?: No Independently performs ADLs?: Yes (appropriate for developmental age) Does the patient have difficulty walking or climbing stairs?: No Weakness of Legs: None Weakness of Arms/Hands: None  Permission Sought/Granted Permission sought to share information with : Case Manager, Family Supports, PCP Permission granted to share information with : Yes, Verbal Permission Granted  Share Information with NAME: Giovani Feole  Permission granted to share info w AGENCY: DME agency  Permission granted to share info w Relationship: wife  Permission granted to share info w Contact Information: [phone]  Emotional Assessment Appearance:: Appears stated age Attitude/Demeanor/Rapport: Engaged Affect (typically observed): Accepting Orientation: : Oriented to Self, Oriented to Place, Oriented to  Time, Oriented to Situation   Psych Involvement: No (comment)  Admission diagnosis:  Cellulitis of right lower extremity [L03.115] Leg pain, medial, right [M79.604] Cellulitis of right foot [L03.115] Patient Active Problem List   Diagnosis Date Noted   Osteomyelitis of great toe of right foot ([place])    Cutaneous abscess of right foot    Cellulitis of right foot 09/15/2021   Leg pain, medial, right 09/14/2021   Permanent atrial fibrillation ([place]) 01/27/2021   SIRS (systemic inflammatory response syndrome) ([place]) 01/27/2021   HTN (hypertension) 01/27/2021  Hypokalemia [id number]   Acute systolic CHF (congestive heart failure) ([place]) 01/27/2021   New onset of congestive heart failure ([place]) 01/26/2021   Acute CVA (cerebrovascular accident) ([place]) 08/07/2020   Acute  upper respiratory infection 11/27/2014   Numbness of finger 09/19/2011   Hilar adenopathy    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[place]) [id number]   Uncontrolled type 2 diabetes mellitus with microalbuminuria 04/23/2010   Renal insufficiency 03/21/2008   VISUAL ACUITY, DECREASED, LEFT EYE 03/19/2008   BELL'S PALSY, LEFT 01/30/2008   Morbid obesity ([place]) 01/20/2008   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

## 2021-09-18 NOTE — Progress Notes (Signed)
Patient ID: Randy [name], male   DOB: Sep 09, 1970, 51 y.o.   MRN: [id number] Patient is postoperative day 1 amputation right first ray.  The abscess from the great toe extended circumferential around the first metatarsal head.  The first ray was resected proximal to the metatarsal head and the tissue margins were grossly clear.  Would continue the IV antibiotics while in the hospital.  The wound VAC is functioning well this will remain in place for 1 week.  Patient was instructed in the use of the wound VAC.  I will follow-up in the office next week.  Ideally minimize weightbearing on the right lower extremity.

## 2021-09-19 ENCOUNTER — Other Ambulatory Visit (HOSPITAL_COMMUNITY): Payer: Self-pay

## 2021-09-19 LAB — CBC
HCT: 35.6 % — ABNORMAL LOW (ref 39.0–52.0)
Hemoglobin: 11.4 g/dL — ABNORMAL LOW (ref 13.0–17.0)
MCH: 26.5 pg (ref 26.0–34.0)
MCHC: 32 g/dL (ref 30.0–36.0)
MCV: 82.6 fL (ref 80.0–100.0)
Platelets: 290 10*3/uL (ref 150–400)
RBC: 4.31 MIL/uL (ref 4.22–5.81)
RDW: 15.5 % (ref 11.5–15.5)
WBC: 16.4 10*3/uL — ABNORMAL HIGH (ref 4.0–10.5)
nRBC: 0 % (ref 0.0–0.2)

## 2021-09-19 LAB — CULTURE, BLOOD (ROUTINE X 2)
Culture: NO GROWTH
Culture: NO GROWTH
Special Requests: ADEQUATE
Special Requests: ADEQUATE

## 2021-09-19 LAB — IRON AND TIBC
Iron: 15 ug/dL — ABNORMAL LOW (ref 45–182)
Saturation Ratios: 10 % — ABNORMAL LOW (ref 17.9–39.5)
TIBC: 153 ug/dL — ABNORMAL LOW (ref 250–450)
UIBC: 138 ug/dL

## 2021-09-19 LAB — BASIC METABOLIC PANEL
Anion gap: 7 (ref 5–15)
BUN: 20 mg/dL (ref 6–20)
CO2: 18 mmol/L — ABNORMAL LOW (ref 22–32)
Calcium: 8.2 mg/dL — ABNORMAL LOW (ref 8.9–10.3)
Chloride: 109 mmol/L (ref 98–111)
Creatinine, Ser: 1.89 mg/dL — ABNORMAL HIGH (ref 0.61–1.24)
GFR, Estimated: 43 mL/min — ABNORMAL LOW (ref >60)
Glucose, Bld: 112 mg/dL — ABNORMAL HIGH (ref 70–99)
Potassium: 4 mmol/L (ref 3.5–5.1)
Sodium: 134 mmol/L — ABNORMAL LOW (ref 135–145)

## 2021-09-19 LAB — FERRITIN: Ferritin: 705 ng/mL — ABNORMAL HIGH (ref 24–336)

## 2021-09-19 LAB — GLUCOSE, CAPILLARY
Glucose-Capillary: 100 mg/dL — ABNORMAL HIGH (ref 70–99)
Glucose-Capillary: 166 mg/dL — ABNORMAL HIGH (ref 70–99)

## 2021-09-19 MED ORDER — METFORMIN HCL ER 500 MG PO TB24
500.0000 mg | ORAL_TABLET | Freq: Every day | ORAL | 0 refills | Status: DC
  Filled 2021-09-19: qty 30, 30d supply, fill #0

## 2021-09-19 MED ORDER — ASCORBIC ACID 1000 MG PO TABS
1000.0000 mg | ORAL_TABLET | Freq: Every day | ORAL | 0 refills | Status: DC
  Filled 2021-09-19: qty 30, 30d supply, fill #0

## 2021-09-19 MED ORDER — PANTOPRAZOLE SODIUM 40 MG PO TBEC
40.0000 mg | DELAYED_RELEASE_TABLET | Freq: Every day | ORAL | 0 refills | Status: DC
  Filled 2021-09-19: qty 15, 15d supply, fill #0

## 2021-09-19 MED ORDER — OXYCODONE HCL 5 MG PO TABS
5.0000 mg | ORAL_TABLET | ORAL | 0 refills | Status: AC | PRN
  Filled 2021-09-19: qty 6, 1d supply, fill #0

## 2021-09-19 MED ORDER — DOCUSATE SODIUM 100 MG PO CAPS
100.0000 mg | ORAL_CAPSULE | Freq: Every day | ORAL | 0 refills | Status: DC
  Filled 2021-09-19: qty 30, 30d supply, fill #0

## 2021-09-19 MED ORDER — SPIRONOLACTONE 25 MG PO TABS
25.0000 mg | ORAL_TABLET | Freq: Every day | ORAL | Status: DC
  Administered 2021-09-19: 25 mg via ORAL
  Filled 2021-09-19: qty 1

## 2021-09-19 MED ORDER — VANCOMYCIN HCL 10 G IV SOLR
2500.0000 mg | INTRAVENOUS | Status: DC
  Filled 2021-09-19: qty 25

## 2021-09-19 MED ORDER — JUVEN PO PACK
1.0000 | PACK | Freq: Two times a day (BID) | ORAL | 0 refills | Status: DC
Start: 2021-09-19 — End: 2022-03-25
  Filled 2021-09-19: qty 30, 15d supply, fill #0

## 2021-09-19 MED ORDER — FERROUS GLUCONATE 324 (38 FE) MG PO TABS
324.0000 mg | ORAL_TABLET | Freq: Every day | ORAL | 0 refills | Status: DC
Start: 2021-09-19 — End: 2022-03-25
  Filled 2021-09-19: qty 30, 30d supply, fill #0

## 2021-09-19 MED ORDER — ZINC SULFATE 220 (50 ZN) MG PO TABS
220.0000 mg | ORAL_TABLET | Freq: Every day | ORAL | 0 refills | Status: DC
Start: 2021-09-20 — End: 2022-03-25
  Filled 2021-09-19: qty 30, 30d supply, fill #0

## 2021-09-19 MED ORDER — DOXYCYCLINE HYCLATE 100 MG PO TABS
100.0000 mg | ORAL_TABLET | Freq: Two times a day (BID) | ORAL | 0 refills | Status: DC
  Filled 2021-09-19: qty 28, 14d supply, fill #0

## 2021-09-19 NOTE — Progress Notes (Signed)
[facility] to be D/C'd  per MD order.  Discussed with the patient and all questions fully answered.  VSS, Skin clean, dry and intact without evidence of skin break down, no evidence of skin tears noted.  IV catheter discontinued intact. Site without signs and symptoms of complications. Dressing and pressure applied.  An After Visit Summary was printed and given to the patient. Patient received prescription from [facility].  D/c education completed with patient/family including follow up instructions, medication list, d/c activities limitations if indicated, with other d/c instructions as indicated by MD - patient able to verbalize understanding, all questions fully answered.   Patient instructed to return to ED, call 911, or call MD for any changes in condition.   Patient to be escorted via [place], and D/C home via private auto.

## 2021-09-19 NOTE — Progress Notes (Addendum)
Advanced Heart Failure Rounding Note  PCP-Cardiologist: None   Subjective:   05/21: Admit with osteomyelitis R great toe and cellulitis RLE 05/24: S/p R first ray amputation + placement wound VAC  Continues on IV Vanc and Unasyn. BC X 2 with NG X 5 days.  Tmax 99.23F this am. WBC 18>20>18>16>16K.  Still having some pain in right calf but improving.   Denies dyspnea, orthopnea or PND.  BP elevated.  Objective:   Weight Range: (!) 165.6 kg Body mass index is 40.1 kg/m.   Vital Signs:   Temp:  [98 F (36.7 C)-99.9 F (37.7 C)] 98 F (36.7 C) (05/26 0748) Pulse Rate:  [72-79] 76 (05/26 0748) Resp:  [17-18] 18 (05/26 0748) BP: (147-175)/(83-97) 166/85 (05/26 0748) SpO2:  [95 %-97 %] 95 % (05/26 0748) Last BM Date : 09/17/21  Weight change: Filed Weights   09/17/21 1146  Weight: (!) 165.6 kg    Intake/Output:   Intake/Output Summary (Last 24 hours) at 09/19/2021 0806 Last data filed at 09/19/2021 0448 Gross per 24 hour  Intake 3323.51 ml  Output 2600 ml  Net 723.51 ml      Physical Exam    General:  No distress. Sitting up in bed. HEENT: Normal Neck: Supple. JVP difficult to assess d/t neck size. Carotids 2+ bilat; no bruits.  Cor: PMI nondisplaced. Regular rate & rhythm. No rubs, gallops or murmurs. Lungs: Clear Abdomen: obese, soft, nontender, nondistended.  Extremities: No cyanosis, clubbing, rash, wound vac present medial aspect R foot,  Neuro: Alert & orientedx3, cranial nerves grossly intact. moves all 4 extremities w/o difficulty. Affect pleasant   Telemetry   N/A  Labs    CBC Recent Labs    09/18/21 0151 09/19/21 0423  WBC 15.6* 16.4*  HGB 11.9* 11.4*  HCT 37.6* 35.6*  MCV 82.6 82.6  PLT 327 [id number]   Basic Metabolic Panel Recent Labs    09/17/21 0235 09/18/21 0151 09/19/21 0423  NA 137 135 134*  K 3.9 3.4* 4.0  CL 108 107 109  CO2 19* 21* 18*  GLUCOSE 133* 133* 112*  BUN 24* 22* 20  CREATININE 2.10* 1.96* 1.89*  CALCIUM  8.5* 8.2* 8.2*  MG 2.2  --   --    Liver Function Tests No results for input(s): AST, ALT, ALKPHOS, BILITOT, PROT, ALBUMIN in the last 72 hours. No results for input(s): LIPASE, AMYLASE in the last 72 hours. Cardiac Enzymes No results for input(s): CKTOTAL, CKMB, CKMBINDEX, TROPONINI in the last 72 hours.  BNP: BNP (last 3 results) Recent Labs    01/26/21 1942 07/02/21 1255  BNP 318.4* 625.9*    ProBNP (last 3 results) No results for input(s): PROBNP in the last 8760 hours.   D-Dimer No results for input(s): DDIMER in the last 72 hours. Hemoglobin A1C No results for input(s): HGBA1C in the last 72 hours. Fasting Lipid Panel No results for input(s): CHOL, HDL, LDLCALC, TRIG, CHOLHDL, LDLDIRECT in the last 72 hours. Thyroid Function Tests No results for input(s): TSH, T4TOTAL, T3FREE, THYROIDAB in the last 72 hours.  Invalid input(s): FREET3  Other results:   Imaging    No results found.   Medications:     Scheduled Medications:  acetaminophen  1,000 mg Oral Q8H   amiodarone  200 mg Oral BID   apixaban  5 mg Oral BID   vitamin C  1,000 mg Oral Daily   atorvastatin  20 mg Oral QHS   collagenase   Topical Daily  docusate sodium  100 mg Oral Daily   empagliflozin  10 mg Oral Daily   insulin aspart  0-15 Units Subcutaneous TID WC   insulin glargine-yfgn  12 Units Subcutaneous QHS   isosorbide-hydrALAZINE  2 tablet Oral TID   lidocaine  1 patch Transdermal Q24H   metoprolol succinate  100 mg Oral Daily   nutrition supplement (JUVEN)  1 packet Oral BID BM   pantoprazole  40 mg Oral Daily   sacubitril-valsartan  1 tablet Oral BID   zinc sulfate  220 mg Oral Daily    Infusions:  sodium chloride 75 mL/hr at 09/18/21 1535   ampicillin-sulbactam (UNASYN) IV 3 g (09/19/21 0523)   magnesium sulfate bolus IVPB     vancomycin      PRN Medications: acetaminophen, alum & mag hydroxide-simeth, bisacodyl, guaiFENesin-dextromethorphan, hydrALAZINE, HYDROmorphone  (DILAUDID) injection, labetalol, magnesium citrate, magnesium sulfate bolus IVPB, metoprolol tartrate, ondansetron, oxyCODONE, phenol, polyethylene glycol, potassium chloride    Patient Profile   51 y/o male w/ HTN, 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   Assessment/Plan   R LE Osteomyelitis/R lower leg cellulitis - s/p R first ray amputation and placement of wound VAC 05/23 - Continues on IV vanc + Unasyn - BC NG X 5 days - Management per primary team   2. 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 LHC w/ mild nonobstructive CAD. Suspect HTN cardiomyopathy.   -ECHO 01/31/21 EF 30-35% with severe LVH. Doubt amyloid with uncontrolled hypertension. - Echo 3/23 EF 55-60% (recovered EF) - NYHA Class I, appears euvolemic on exam. Not requiring loop diuretic. - Continue Entresto 97-103 mg bid - Continue Jardiance 10 mg daily  - Continue Bidil 2 tab tid  - Continue Toprol XL 100 mg daily - BP elevated. Suspect in part d/t pain. Add back home spiro 25 mg daily.   3. PAF - 01/31/21 S/P TEE/DC-CV  with restoration NSR  - recurrent AF 3/23 s/p DCCV - on amio 200 mg bid - denies symptoms of breakthrough AF. ECG on admit with SR. RRR on exam  - on Eliquis 5 mg bid    4. Hypertension  - moderately elevated, in setting of pain - GDMT per above - PRN IV hydralazine if needed    5. AKI on CKD Stage IIIb  - Creatinine baseline 1.7-1.9 - SCr up to 2.4 this admit, has trended down to baseline, 1.9 today.  - Followed by twice a year by Nephrology in [place].    6. DMII - poorly controlled, A1c 10.1  - insulin per primary team  - on Jardiance    7. OSA - on CPAP    8. CAD - mild nonobstructive on cath - no s/s angina  - c/w statin, LDL Goal < 70  - on ? blocker - no ASA w/ Eliquis    Will arrange for f/u in HF clinic. Missed last appointment.   Length of  Stay: 4  FINCH, LINDSAY N, PA-C  09/19/2021, 8:06 AM  Advanced Heart Failure Team Pager [phone] (M-F; 7a - 5p)  Please contact [facility] for night-coverage after hours (5p -7a ) and weekends on [email or web address]   Patient seen and examined with the above-signed Advanced Practice Provider and/or Housestaff. I personally reviewed laboratory data, imaging studies and relevant notes. I independently examined the patient and formulated the important aspects of the plan. I have edited the note to reflect any of my  changes or salient points. I have personally discussed the plan with the patient and/or family.  Now s/p R great toe amp. Still with some pain but doing ok. HF stable.   General:  Well appearing. No resp difficulty HEENT: normal Neck: supple. no JVD. Carotids 2+ bilat; no bruits. No lymphadenopathy or thryomegaly appreciated. Cor: PMI nondisplaced. Regular rate & rhythm. No rubs, gallops or murmurs. Lungs: clear Abdomen: soft, nontender, nondistended. No hepatosplenomegaly. No bruits or masses. Good bowel sounds. Extremities: no cyanosis, clubbing, rash, edema R foot with wound vac.  Neuro: alert & orientedx3, cranial nerves grossly intact. moves all 4 extremities w/o difficulty. Affect pleasant  Ok for d/c from HF standpoint. We will follow as outpatient.  Glori Bickers, MD  12:35 PM

## 2021-09-19 NOTE — Discharge Instructions (Addendum)
INTERNAL MEDICINE MD INSTRUCTIONS:  You were seen in the hospital for a right big toe infection and found to have an infection extending to the bone.  You underwent amputation successfully.  You have a wound VAC in place. You will leave this on for 1 week. Ideally minimize weightbearing on the right side. Dr. Lajoyce Corners will follow-up with you outside of the hospital after 1 week. They will reach out to you to arrange this appointment.  For your diabetes, I am discharging you on metformin 500 mg once daily along with your jardiance. You will also start taking your spironolactone and will see the heart failure doctors for a follow-up appointment outside of the hospital.   Other recommendations: You will need to take some iron pills moving forward. Please take these with the stool softener I have prescribed. You will need to get your iron stores checked at your next visit with your primary care doctor.   Please follow-up with your primary care doctor within 1-2 weeks to discuss your recent hospitalization.  If any symptoms change or worsen acutely, please return to the nearest emergency department.

## 2021-09-19 NOTE — Progress Notes (Signed)
Pharmacy Antibiotic Note  Randy [name] is a 51 y.o. male admitted on 09/14/2021 with  diabetic R foot infection, cellulitis and first digit osteo .  Pharmacy has been consulted for Unasyn and vancomycin dosing.  Patient is s/p toe amputation on 5/24 with grossly clear margin per Ortho.  Renal function continues to improve (BL SCr ~1.7-2).  Afebrile, WBC up to 16.4 post-op.  Plan: Increase vanc to 2500mg  IV Q24H for AUC 518 using SCr 1.89 (AUG goal 400-550) Continue Unasyn 3gm IV Q6H Monitor renal fxn, clinical progress No plan for vanc levels given anticipated 7 days of therapy (= 5/27)  Height: 6\' 8"  (203.2 cm) Weight: (!) 165.6 kg (365 lb) IBW/kg (Calculated) : 96  Temp (24hrs), Avg:99.1 F (37.3 C), Min:98.2 F (36.8 C), Max:99.9 F (37.7 C)  Recent Labs  Lab 09/14/21 1340 09/14/21 1545 09/15/21 0115 09/16/21 0333 09/17/21 0235 09/18/21 0151 09/19/21 0423  WBC 18.0*  --  20.3* 20.5* 18.3* 15.6* 16.4*  CREATININE 2.40*  --  2.41* 2.18* 2.10* 1.96* 1.89*  LATICACIDVEN 1.0 1.0  --   --   --   --   --      Estimated Creatinine Clearance: 83.7 mL/min (A) (by C-G formula based on SCr of 1.89 mg/dL (H)).    Allergies  Allergen Reactions   Fish-Derived Products Anaphylaxis    Augmentin PTA since 5/16 Vanc 5/21 >> Unasyn 5/21 >>    5/21 BCx - NGTD  Zina Pitzer D. 6/21, PharmD, BCPS, BCCCP 09/19/2021, 7:28 AM

## 2021-09-23 ENCOUNTER — Ambulatory Visit (INDEPENDENT_AMBULATORY_CARE_PROVIDER_SITE_OTHER): Payer: 59 | Admitting: Orthopedic Surgery

## 2021-09-23 DIAGNOSIS — S98111A Complete traumatic amputation of right great toe, initial encounter: Secondary | ICD-10-CM

## 2021-09-23 DIAGNOSIS — Z89411 Acquired absence of right great toe: Secondary | ICD-10-CM

## 2021-09-24 ENCOUNTER — Encounter: Payer: Self-pay | Admitting: Orthopedic Surgery

## 2021-09-24 ENCOUNTER — Telehealth: Payer: Self-pay | Admitting: Orthopedic Surgery

## 2021-09-24 NOTE — Progress Notes (Signed)
Office Visit Note   Patient: Randy [name]           Date of Birth: 06-May-1970           MRN: [id number] Visit Date: 09/23/2021              Requested by: [facility], [facility] [address] [place],  [place] [address] PCP: [facility], [place]  Chief Complaint  Patient presents with   Right Foot - Routine Post Op    09/17/21 right 1st ray amputation       HPI: Patient is a 51 year old gentleman who presents 1 week status post right first ray amputation with application of Kerecis and wound VAC.  Assessment & Plan: Visit Diagnoses:  1. Amputated great toe of right foot ([place])     Plan: Start Dial soap cleansing dry dressing change nonweightbearing  Follow-Up Instructions: Return in about 1 week (around 09/30/2021).   Ortho Exam  Patient is alert, oriented, no adenopathy, well-dressed, normal affect, normal respiratory effort. Examination the wound VAC is removed there is some macerated tissue around the wounds the edges are well approximated there is a small draining resolving hematoma.  There is cellulitis.  Imaging: No results found. No images are attached to the encounter.  Labs: Lab Results  Component Value Date   HGBA1C 10.1 (H) 09/14/2021   HGBA1C 6.3 (H) 01/27/2021   HGBA1C 7.4 (H) 08/08/2020   ESRSEDRATE 3 08/07/2020   REPTSTATUS 09/19/2021 FINAL 09/14/2021   CULT  09/14/2021    NO GROWTH 5 DAYS Performed at [facility], [place] [address] [place], [place] [address]      Lab Results  Component Value Date   ALBUMIN 2.7 (L) 09/15/2021   ALBUMIN 2.9 (L) 09/14/2021   ALBUMIN 3.6 07/02/2021    Lab Results  Component Value Date   MG 2.2 09/17/2021   MG 1.8 01/30/2021   MG 1.8 01/26/2021   No results found for: VD25OH  No results found for: PREALBUMIN    Latest Ref Rng & Units 09/19/2021    4:23 AM 09/18/2021    1:51 AM 09/17/2021    2:35 AM  CBC EXTENDED  WBC 4.0 - 10.5 K/uL 16.4   15.6   18.3    RBC 4.22 - 5.81 MIL/uL 4.31    4.55   4.79    Hemoglobin 13.0 - 17.0 g/dL 11.4   11.9   12.6    HCT 39.0 - 52.0 % 35.6   37.6   39.6    Platelets 150 - 400 K/uL 290   327   309       There is no height or weight on file to calculate BMI.  Orders:  No orders of the defined types were placed in this encounter.  No orders of the defined types were placed in this encounter.    Procedures: No procedures performed  Clinical Data: No additional findings.  ROS:  All other systems negative, except as noted in the HPI. Review of Systems  Objective: Vital Signs: There were no vitals taken for this visit.  Specialty Comments:  No specialty comments available.  PMFS History: Patient Active Problem List   Diagnosis Date Noted   Osteomyelitis of great toe of right foot ([place])    Cutaneous abscess of right foot    Cellulitis of right foot 09/15/2021   Leg pain, medial, right 09/14/2021   Permanent atrial fibrillation ([place]) 01/27/2021   SIRS (systemic inflammatory response syndrome) ([place]) 01/27/2021  HTN (hypertension) 01/27/2021   Hypokalemia 19/62/2297   Acute systolic CHF (congestive heart failure) ([place]) 01/27/2021   New onset of congestive heart failure ([place]) 01/26/2021   Acute CVA (cerebrovascular accident) ([place]) 08/07/2020   Acute upper respiratory infection 11/27/2014   Numbness of finger 09/19/2011   Hilar adenopathy    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[place]) 98/92/1194   Uncontrolled type 2 diabetes mellitus with microalbuminuria 04/23/2010   Renal insufficiency 03/21/2008   VISUAL ACUITY, DECREASED, LEFT EYE 03/19/2008   BELL'S PALSY, LEFT 01/30/2008   Morbid obesity ([place]) 01/20/2008   Malignant hypertension with heart failure and stage 3 chronic kidney disease ([place]) 01/20/2008   Past Medical History:  Diagnosis Date   CKD (chronic kidney disease) stage 3, GFR 30-59 ml/min (HCC)    baseline Cr 1.5-1.7   Decreased visual acuity    Left eye, resolved - hypertensive retinopathy   Diabetes mellitus 2012   dx  hospitalization with A1c 6.6%   Hilar adenopathy    on CT scan 02/2010, on rpt scan stable/improved.   History of Bell's palsy 9/09   history, Left   History of headache    HTN (hypertension), malignant    previously on BC and goody powders for HA   Internal derangement of knee 9/09   Left   Microalbuminuria    Morbid obesity ([place])    Stroke ([place])    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[place])    echo 2011 with nonischemic hypertensive cardiomyopathy   Systolic murmur    Vitamin D deficiency     Family History  Problem Relation Age of Onset   Hypertension Mother    Hypertension Father    Diabetes Father    Hypertension Brother    Diabetes Sister    Coronary artery disease Neg Hx    Stroke Neg Hx    Cancer Neg Hx    Kidney disease Paternal Grandmother        ESRD    Past Surgical History:  Procedure Laterality Date   AMPUTATION Right 09/17/2021   Procedure: First ray amputation;  Surgeon: Newt Minion, MD;  Location: [facility];  Service: Orthopedics;  Laterality: Right;   CARDIOVERSION N/A 01/31/2021   Procedure: CARDIOVERSION;  Surgeon: Jolaine Artist, MD;  Location: [facility] ENDOSCOPY;  Service: Cardiovascular;  Laterality: N/A;   CARDIOVERSION N/A 07/04/2021   Procedure: CARDIOVERSION;  Surgeon: Jolaine Artist, MD;  Location: [facility] ENDOSCOPY;  Service: Cardiovascular;  Laterality: N/A;   hospitalization  11/2008   malignant HTN, nl SPEP/UPEP, neg ANCA panel, nl C3/4, neg anti GBM Ab, neg Hep A/B/C, nl renal US, nl PTH, neg HIV   RIGHT/LEFT HEART CATH AND CORONARY ANGIOGRAPHY N/A 01/29/2021   Procedure: RIGHT/LEFT HEART CATH AND CORONARY ANGIOGRAPHY;  Surgeon: Jolaine Artist, MD;  Location: [facility] CV LAB;  Service: Cardiovascular;  Laterality: N/A;   TEE WITHOUT CARDIOVERSION N/A 01/31/2021   Procedure: TRANSESOPHAGEAL ECHOCARDIOGRAM (TEE);  Surgeon: Jolaine Artist, MD;  Location: [facility];  Service: Cardiovascular;  Laterality: N/A;   US ECHOCARDIOGRAPHY  11/2008   LVsys fxn EF 50%,  mild MR, normal LV size, neg ANA, neg cryoglobulins   US ECHOCARDIOGRAPHY  2011   severe LVH, EF 40%, LA mildly dilated, PA pressure moderately increased   Social History   Occupational History   Occupation: Tax Forensic psychologist    Comment: [employer]  Tobacco Use   Smoking status: Never   Smokeless tobacco: Never   Tobacco comments:    monthly, when going out  Media planner  Vaping Use: Never used  Substance and Sexual Activity   Alcohol use: Yes    Comment: Occasional   Drug use: No   Sexual activity: Not on file

## 2021-09-24 NOTE — Telephone Encounter (Signed)
Received $25.00 cash, medical records release form/ Forwarding to [employer] today ?

## 2021-09-30 ENCOUNTER — Ambulatory Visit (INDEPENDENT_AMBULATORY_CARE_PROVIDER_SITE_OTHER): Payer: 59 | Admitting: Family

## 2021-09-30 DIAGNOSIS — S98111A Complete traumatic amputation of right great toe, initial encounter: Secondary | ICD-10-CM

## 2021-09-30 DIAGNOSIS — Z89411 Acquired absence of right great toe: Secondary | ICD-10-CM

## 2021-09-30 NOTE — Progress Notes (Signed)
Post-Op Visit Note   Patient: Randy [name]           Date of Birth: [birth year]           MRN: [id number] Visit Date: 09/30/2021 [facility]CP: [facility]  Chief Complaint:  Chief Complaint  Patient presents with   Right Foot - Routine Post Op    09/17/21 1st ray amputation    HPI:  HPI The patient is a 51 year old gentleman seen status post first ray amputation on May 24 he has been full weightbearing in regular shoewear doing daily Dial soap cleansing and dry dressing changes  States he would like to return to work for seated work Ortho Exam On examination of the right first ray amputation the incision is well approximated there is dorsal ulceration with ischemic changes this is 4 cm in diameter there is no active drainage no erythema or warmth  Visit Diagnoses:  1. Amputated great toe of right foot ([place])     Plan: Discussed the importance of nonweightbearing elevation for swelling he will continue with daily Dial soap cleansing dry dressings.  Hope to harvest sutures at next visit  Follow-Up Instructions: No follow-ups on file.   Imaging: No results found.  Orders:  No orders of the defined types were placed in this encounter.  No orders of the defined types were placed in this encounter.    PMFS History: Patient Active Problem List   Diagnosis Date Noted   Osteomyelitis of great toe of right foot ([place])    Cutaneous abscess of right foot    Cellulitis of right foot 09/15/2021   Leg pain, medial, right 09/14/2021   Permanent atrial fibrillation ([place]) 01/27/2021   SIRS (systemic inflammatory response syndrome) ([place]) 01/27/2021   HTN (hypertension) 01/27/2021   Hypokalemia 38/18/2993   Acute systolic CHF (congestive heart failure) ([place]) 01/27/2021   New onset of congestive heart failure ([place]) 01/26/2021   Acute CVA (cerebrovascular accident) ([place]) 08/07/2020   Acute upper respiratory infection 11/27/2014   Numbness of finger 09/19/2011   Hilar adenopathy     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[place]) 71/69/6789   Uncontrolled type 2 diabetes mellitus with microalbuminuria 04/23/2010   Renal insufficiency 03/21/2008   VISUAL ACUITY, DECREASED, LEFT EYE 03/19/2008   BELL'S PALSY, LEFT 01/30/2008   Morbid obesity ([place]) 01/20/2008   Malignant hypertension with heart failure and stage 3 chronic kidney disease ([place]) 01/20/2008   Past Medical History:  Diagnosis Date   CKD (chronic kidney disease) stage 3, GFR 30-59 ml/min (HCC)    baseline Cr 1.5-1.7   Decreased visual acuity    Left eye, resolved - hypertensive retinopathy   Diabetes mellitus 2012   dx hospitalization with A1c 6.6%   Hilar adenopathy    on CT scan 02/2010, on rpt scan stable/improved.   History of Bell's palsy 9/09   history, Left   History of headache    HTN (hypertension), malignant    previously on BC and goody powders for HA   Internal derangement of knee 9/09   Left   Microalbuminuria    Morbid obesity ([place])    Stroke ([place])    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[place])    echo 2011 with nonischemic hypertensive cardiomyopathy   Systolic murmur    Vitamin D deficiency     Family History  Problem Relation Age of Onset   Hypertension Mother    Hypertension Father    Diabetes Father    Hypertension Brother    Diabetes Sister    Coronary  artery disease Neg Hx    Stroke Neg Hx    Cancer Neg Hx    Kidney disease Paternal Grandmother        ESRD    Past Surgical History:  Procedure Laterality Date   AMPUTATION Right 09/17/2021   Procedure: First ray amputation;  Surgeon: Newt Minion, MD;  Location: [facility];  Service: Orthopedics;  Laterality: Right;   CARDIOVERSION N/A 01/31/2021   Procedure: CARDIOVERSION;  Surgeon: Jolaine Artist, MD;  Location: [facility] ENDOSCOPY;  Service: Cardiovascular;  Laterality: N/A;   CARDIOVERSION N/A 07/04/2021   Procedure: CARDIOVERSION;  Surgeon: Jolaine Artist, MD;  Location: [facility] ENDOSCOPY;  Service: Cardiovascular;  Laterality: N/A;   hospitalization  11/2008    malignant HTN, nl SPEP/UPEP, neg ANCA panel, nl C3/4, neg anti GBM Ab, neg Hep A/B/C, nl renal US, nl PTH, neg HIV   RIGHT/LEFT HEART CATH AND CORONARY ANGIOGRAPHY N/A 01/29/2021   Procedure: RIGHT/LEFT HEART CATH AND CORONARY ANGIOGRAPHY;  Surgeon: Jolaine Artist, MD;  Location: [place] [facility] CV LAB;  Service: Cardiovascular;  Laterality: N/A;   TEE WITHOUT CARDIOVERSION N/A 01/31/2021   Procedure: TRANSESOPHAGEAL ECHOCARDIOGRAM (TEE);  Surgeon: Jolaine Artist, MD;  Location: [facility];  Service: Cardiovascular;  Laterality: N/A;   US ECHOCARDIOGRAPHY  11/2008   LVsys fxn EF 50%, mild MR, normal LV size, neg ANA, neg cryoglobulins   US ECHOCARDIOGRAPHY  2011   severe LVH, EF 40%, LA mildly dilated, PA pressure moderately increased   Social History   Occupational History   Occupation: Tax Forensic psychologist    Comment: [employer]  Tobacco Use   Smoking status: Never   Smokeless tobacco: Never   Tobacco comments:    monthly, when going out  Vaping Use   Vaping Use: Never used  Substance and Sexual Activity   Alcohol use: Yes    Comment: Occasional   Drug use: No   Sexual activity: Not on file

## 2021-10-06 ENCOUNTER — Emergency Department (HOSPITAL_COMMUNITY)
Admission: EM | Admit: 2021-10-06 | Discharge: 2021-10-06 | Disposition: A | Discharge status: Home / Self Care | Payer: 59 | Attending: Emergency Medicine | Admitting: Emergency Medicine

## 2021-10-06 ENCOUNTER — Encounter (HOSPITAL_COMMUNITY): Payer: Self-pay

## 2021-10-06 ENCOUNTER — Other Ambulatory Visit: Payer: Self-pay

## 2021-10-06 DIAGNOSIS — Y829 Unspecified medical devices associated with adverse incidents: Secondary | ICD-10-CM | POA: Insufficient documentation

## 2021-10-06 DIAGNOSIS — Z48817 Encounter for surgical aftercare following surgery on the skin and subcutaneous tissue: Secondary | ICD-10-CM | POA: Diagnosis present

## 2021-10-06 DIAGNOSIS — Z7901 Long term (current) use of anticoagulants: Secondary | ICD-10-CM | POA: Diagnosis not present

## 2021-10-06 DIAGNOSIS — T8130XA Disruption of wound, unspecified, initial encounter: Secondary | ICD-10-CM

## 2021-10-06 DIAGNOSIS — T8131XA Disruption of external operation (surgical) wound, not elsewhere classified, initial encounter: Secondary | ICD-10-CM | POA: Diagnosis not present

## 2021-10-06 MED ORDER — LIDOCAINE-EPINEPHRINE 2 %-1:200000 IJ SOLN
10.0000 mL | Freq: Once | INTRAMUSCULAR | Status: AC
Start: 2021-10-06 — End: 2021-10-06
  Administered 2021-10-06: 10 mL
  Filled 2021-10-06: qty 20

## 2021-10-06 MED ORDER — TRANEXAMIC ACID FOR EPISTAXIS
500.0000 mg | Freq: Once | TOPICAL | Status: AC
  Administered 2021-10-06: 500 mg via TOPICAL
  Filled 2021-10-06: qty 10

## 2021-10-06 NOTE — ED Provider Notes (Signed)
[facility] EMERGENCY DEPARTMENT Provider Note   CSN: [id number] Arrival date & time: 10/06/21  1918     History  Chief Complaint  Patient presents with   Post-op Problem    51 year old male with a past medical history of prior CVA on chronic Eliquis, currently 3 weeks s/p right first ray amputation with Dr. Lajoyce Corners presents to the ED following acute onset of bleeding from his surgical wound approximate 3 hours ago.  Patient states that he was ambulating at home when he thought one of his stitches had popped out.  He denies direct trauma to the area.  He states that he then noticed a large amount of bleeding and had difficulty controlling this at home.  The history is provided by the patient and medical records.       Home Medications Prior to Admission medications   Medication Sig Start Date End Date Taking? Authorizing Provider  amiodarone (PACERONE) 200 MG tablet Take 1 tablet (200 mg total) by mouth 2 (two) times daily. 07/02/21   Bensimhon, Bevelyn Buckles, MD  apixaban (ELIQUIS) 5 MG TABS tablet Take 1 tablet (5 mg total) by mouth 2 (two) times daily. 04/16/21   Clegg, Amy D, NP  ascorbic acid (VITAMIN C) 1000 MG tablet Take 1 tablet (1,000 mg total) by mouth daily. 09/20/21   Andrey Campanile, MD  atorvastatin (LIPITOR) 20 MG tablet Take 1 tablet (20 mg total) by mouth at bedtime. 04/16/21   Clegg, Amy D, NP  docusate sodium (COLACE) 100 MG capsule Take 1 capsule (100 mg total) by mouth daily. 09/20/21   Andrey Campanile, MD  doxycycline (VIBRA-TABS) 100 MG tablet Take 1 tablet (100 mg total) by mouth 2 (two) times daily. 09/19/21   Andrey Campanile, MD  empagliflozin (JARDIANCE) 10 MG TABS tablet Take 1 tablet (10 mg total) by mouth daily. Patient taking differently: Take 10 mg by mouth every morning. 04/16/21   Tonye Becket D, NP  ferrous gluconate (FERGON) 324 MG tablet Take 1 tablet (324 mg total) by mouth daily with breakfast. 09/19/21   Andrey Campanile, MD   isosorbide-hydrALAZINE (BIDIL) 20-37.5 MG tablet Take 2 tablets by mouth 3 (three) times daily.    [provider]  metFORMIN (GLUCOPHAGE-XR) 500 MG 24 hr tablet Take 1 tablet (500 mg total) by mouth daily with breakfast. 09/19/21   Andrey Campanile, MD  metoprolol succinate (TOPROL-XL) 100 MG 24 hr tablet Take 1 tablet (100 mg total) by mouth daily. Take with or immediately following a meal. Patient taking differently: Take 100 mg by mouth every morning. Take with or immediately following a meal. 04/16/21 11/13/21  Clegg, Amy D, NP  nutrition supplement, JUVEN, (JUVEN) PACK Take 1 packet by mouth 2 (two) times daily between meals. 09/19/21   Andrey Campanile, MD  pantoprazole (PROTONIX) 40 MG tablet Take 1 tablet (40 mg total) by mouth daily. 09/20/21   Andrey Campanile, MD  potassium chloride SA (KLOR-CON M20) 20 MEQ tablet TAKE 2 TABLETS BY MOUTH ONLY WHEN TAKING METOLAZONE Patient taking differently: 20 mEq 2 (two) times daily. 09/01/21   Bensimhon, Bevelyn Buckles, MD  sacubitril-valsartan (ENTRESTO) 97-103 MG Take 1 tablet by mouth 2 (two) times daily. 04/16/21   Clegg, Amy D, NP  spironolactone (ALDACTONE) 25 MG tablet Take 1 tablet (25 mg total) by mouth daily. Patient not taking: Reported on 09/14/2021 04/16/21 07/15/21  Tonye Becket D, NP  Zinc Sulfate 220 (50 Zn) MG TABS Take 1 tablet (  220 mg total) by mouth daily. 09/20/21   Andrey Campanile, MD      Allergies    Fish-derived products    Review of Systems   Review of Systems  Musculoskeletal:  Negative for arthralgias and myalgias.  Skin:  Positive for wound.    Physical Exam Updated Vital Signs BP (!) 180/107 (BP Location: Right Arm)   Pulse (!) 58   Temp 98.9 F (37.2 C) (Oral)   Resp 15   Ht 6\' 8"  (2.032 m)   Wt (!) 165.6 kg   SpO2 100%   BMI 40.11 kg/m  Physical Exam Vitals and nursing note reviewed.  Constitutional:      General: He is not in acute distress.    Appearance: He is well-developed.  HENT:      Head: Normocephalic.  Cardiovascular:     Rate and Rhythm: Normal rate.     Pulses:          Dorsalis pedis pulses are 2+ on the right side and 2+ on the left side.  Pulmonary:     Effort: Pulmonary effort is normal. No respiratory distress.  Musculoskeletal:     Cervical back: Neck supple.  Skin:    General: Skin is warm and dry.     Comments: Status post amputation of the right great toe.  Wound bed appears healthy.  There is a small area of arterial bleeding noted at the distal aspect of the amputation site.  No surrounding cellulitis or purulent discharge noted.  Neurological:     Mental Status: He is alert.     ED Results / Procedures / Treatments   Labs (all labs ordered are listed, but only abnormal results are displayed) Labs Reviewed - No data to display  EKG None  Radiology No results found.  Procedures Procedures    Medications Ordered in ED Medications  tranexamic acid (CYKLOKAPRON) 1000 MG/10ML topical solution 500 mg (500 mg Topical Given 10/06/21 2126)  lidocaine-EPINEPHrine (XYLOCAINE W/EPI) 2 %-1:200000 (PF) injection 10 mL (10 mLs Infiltration Given 10/06/21 2127)    ED Course/ Medical Decision Making/ A&P                           Medical Decision Making Risk Prescription drug management.   51 year old male with a past medical history as above presents today with bleeding from his recent amputation site.  On arrival, patient is well-appearing and hemodynamically stable.  The wound bed appears clean without evidence of surrounding cellulitis.  He denies direct trauma to the area thus lower suspicion for underlying fractures.  He does have a punctate area of arterial bleeding noted at the distal aspect of the wound.  Direct pressure was applied to this area using TXA soaked gauze.  Combat gauze had been attempted in triage without success.  Triage provider spoke with the orthopedic surgeon on-call who recommended cauterization versus suture with the plan  for follow-up first thing in the morning.  Pressure was held with TXA gauze for approximately 20-30 minutes with resolution of bleeding.  Following this, we could not identify an obvious arterial source amenable for cautery or suture repair.  We had the patient move his foot and ambulate multiple times in the ED without recurrence of his bleeding.  Given the concern for inducing further trauma to the wound bed, will plan to redress the wound and have the patient follow-up with his surgeon in the morning.  We will have patient  hold his morning dose of Eliquis until cleared by his surgeon.  Plan of care was discussed with the patient who verbalized understanding.  Strict ED return precautions given.  At this time, patient stable for discharge home.  Final Clinical Impression(s) / ED Diagnoses Final diagnoses:  Wound dehiscence    Rx / DC Orders ED Discharge Orders     None         Armetta Henri, [place], MD 10/06/21 8032    Pricilla Loveless, MD 10/07/21 [id number]

## 2021-10-06 NOTE — ED Triage Notes (Signed)
Pt had right toe amputated on 09/13/21 and noticed bleeding today. Pt has saturated dressing and is still oozing blood. Pt is on eliquis. Pressure dressing applied

## 2021-10-06 NOTE — Discharge Instructions (Signed)
Please keep your dressings in place and limit ambulating on your wound until your follow-up appointment with Dr. Sharol Given in the morning. Hold your morning dose of Eliquis until cleared by your surgeon. If you develop any recurrence of your bleeding, please return immediately to the emergency department.

## 2021-10-06 NOTE — ED Provider Triage Note (Signed)
Emergency Medicine Provider Triage Evaluation Note  Rami Budhu , a 51 y.o. male  was evaluated in triage.  Pt complains of bleeding from his postop site.  Had great toe amputated about 2 weeks ago.  Started noticing some blood this morning.  Has been nonstop bleeding.  On Eliquis.  Denies lightheadedness or dizziness or fevers  Review of Systems  Positive:  Negative: As above  Physical Exam  BP (!) 158/101 (BP Location: Left Arm)   Pulse 64   Temp 98.9 F (37.2 C) (Oral)   Resp 18   Ht 6\' 8"  (2.032 m)   Wt (!) 165.6 kg   SpO2 99%   BMI 40.11 kg/m  Gen:   Awake, no distress   Resp:  Normal effort  MSK:   Moves extremities without difficulty  Other:  Bright red blood from the wound of the right foot continuous-flow.  Sensation intact of the other toes and rest of the foot.  Medical Decision Making  Medically screening exam initiated at 8:18 PM.  Appropriate orders placed.  Kali Ambler was informed that the remainder of the evaluation will be completed by another provider, this initial triage assessment does not replace that evaluation, and the importance of remaining in the ED until their evaluation is complete.     [name], PA-C 10/06/21 2020

## 2021-10-06 NOTE — ED Notes (Signed)
Patient in room, providers at bedside at this time.

## 2021-10-07 ENCOUNTER — Encounter: Payer: Self-pay | Admitting: Orthopedic Surgery

## 2021-10-07 ENCOUNTER — Ambulatory Visit (INDEPENDENT_AMBULATORY_CARE_PROVIDER_SITE_OTHER): Payer: 59 | Admitting: Orthopedic Surgery

## 2021-10-07 ENCOUNTER — Telehealth: Payer: Self-pay | Admitting: Orthopedic Surgery

## 2021-10-07 DIAGNOSIS — T8131XA Disruption of external operation (surgical) wound, not elsewhere classified, initial encounter: Secondary | ICD-10-CM

## 2021-10-07 DIAGNOSIS — S98111A Complete traumatic amputation of right great toe, initial encounter: Secondary | ICD-10-CM

## 2021-10-07 NOTE — Telephone Encounter (Signed)
Called pt and made appt for today at 2:30

## 2021-10-07 NOTE — Telephone Encounter (Signed)
Patient called. He went to ED yesterday. Need to get in today. Could not stop bleeding. Told to FU with Sharol Given today.

## 2021-10-07 NOTE — Progress Notes (Unsigned)
Office Visit Note   Patient: Randy [name]           Date of Birth: 1971-04-26           MRN: [id number] Visit Date: 10/07/2021              Requested by: No referring provider defined for this encounter. PCP: Pcp, No  Chief Complaint  Patient presents with   Right Foot - Routine Post Op    09/17/21 1st ray amputation      HPI: Patient is a 51 year old gentleman who is 3 weeks status post right foot first ray amputation he is currently on doxycycline and has been on Eliquis for atrial fibrillation.  Patient has had progressive wound dehiscence secondary to bleeding from the blood thinner.  Patient was given a walker but due to his size and height its been difficult for him to use the walker.  Patient did go to the emergency room last night thinking that a stitch had popped out.  Patient reports increasing bleeding from the wound for 3 hours.  Assessment & Plan: Visit Diagnoses:  1. Amputated great toe of right foot ([place])   2. Wound dehiscence, surgical, initial encounter     Plan: Discussed with the patient there is increased skin necrosis secondary to pressure from a hematoma from his blood thinner.  We will plan to proceed with surgery on Friday for wound debridement and placement of tissue graft placement of a cleanse choice wound VAC sponge with discharge to home.  Discussed the importance of minimizing weightbearing we will give him a Darco shoe.  He will hold on the blood thinner at this time he does have A-fib with well-controlled rate.  Follow-Up Instructions: Return in about 2 weeks (around 10/21/2021).   Ortho Exam  Patient is alert, oriented, no adenopathy, well-dressed, normal affect, normal respiratory effort. Examination patient has a palpable dorsalis pedis and posterior tibial pulse the Doppler was used and he has a strong triphasic dorsalis pedis and posterior tibial pulse the rate is controlled.  There is no ascending cellulitis no purulent drainage there is some  resolving hematoma.  Imaging: No results found. No images are attached to the encounter.  Labs: Lab Results  Component Value Date   HGBA1C 10.1 (H) 09/14/2021   HGBA1C 6.3 (H) 01/27/2021   HGBA1C 7.4 (H) 08/08/2020   ESRSEDRATE 3 08/07/2020   REPTSTATUS 09/19/2021 FINAL 09/14/2021   CULT  09/14/2021    NO GROWTH 5 DAYS Performed at [facility], [place] [address] [place], [place] [address]      Lab Results  Component Value Date   ALBUMIN 2.7 (L) 09/15/2021   ALBUMIN 2.9 (L) 09/14/2021   ALBUMIN 3.6 07/02/2021    Lab Results  Component Value Date   MG 2.2 09/17/2021   MG 1.8 01/30/2021   MG 1.8 01/26/2021   No results found for: "VD25OH"  No results found for: "PREALBUMIN"    Latest Ref Rng & Units 09/19/2021    4:23 AM 09/18/2021    1:51 AM 09/17/2021    2:35 AM  CBC EXTENDED  WBC 4.0 - 10.5 K/uL 16.4  15.6  18.3   RBC 4.22 - 5.81 MIL/uL 4.31  4.55  4.79   Hemoglobin 13.0 - 17.0 g/dL 11.4  11.9  12.6   HCT 39.0 - 52.0 % 35.6  37.6  39.6   Platelets 150 - 400 K/uL 290  327  309      There is  no height or weight on file to calculate BMI.  Orders:  No orders of the defined types were placed in this encounter.  No orders of the defined types were placed in this encounter.    Procedures: No procedures performed  Clinical Data: No additional findings.  ROS:  All other systems negative, except as noted in the HPI. Review of Systems  Objective: Vital Signs: There were no vitals taken for this visit.  Specialty Comments:  No specialty comments available.  PMFS History: Patient Active Problem List   Diagnosis Date Noted   Osteomyelitis of great toe of right foot ([place])    Cutaneous abscess of right foot    Cellulitis of right foot 09/15/2021   Leg pain, medial, right 09/14/2021   Permanent atrial fibrillation ([place]) 01/27/2021   SIRS (systemic inflammatory response syndrome) ([place]) 01/27/2021   HTN (hypertension) 01/27/2021   Hypokalemia  82/99/3716   Acute systolic CHF (congestive heart failure) ([place]) 01/27/2021   New onset of congestive heart failure ([place]) 01/26/2021   Acute CVA (cerebrovascular accident) ([place]) 08/07/2020   Acute upper respiratory infection 11/27/2014   Numbness of finger 09/19/2011   Hilar adenopathy    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[place]) 96/78/9381   Uncontrolled type 2 diabetes mellitus with microalbuminuria 04/23/2010   Renal insufficiency 03/21/2008   VISUAL ACUITY, DECREASED, LEFT EYE 03/19/2008   BELL'S PALSY, LEFT 01/30/2008   Morbid obesity ([place]) 01/20/2008   Malignant hypertension with heart failure and stage 3 chronic kidney disease ([place]) 01/20/2008   Past Medical History:  Diagnosis Date   CKD (chronic kidney disease) stage 3, GFR 30-59 ml/min (HCC)    baseline Cr 1.5-1.7   Decreased visual acuity    Left eye, resolved - hypertensive retinopathy   Diabetes mellitus 2012   dx hospitalization with A1c 6.6%   Hilar adenopathy    on CT scan 02/2010, on rpt scan stable/improved.   History of Bell's palsy 9/09   history, Left   History of headache    HTN (hypertension), malignant    previously on BC and goody powders for HA   Internal derangement of knee 9/09   Left   Microalbuminuria    Morbid obesity ([place])    Stroke ([place])    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[place])    echo 2011 with nonischemic hypertensive cardiomyopathy   Systolic murmur    Vitamin D deficiency     Family History  Problem Relation Age of Onset   Hypertension Mother    Hypertension Father    Diabetes Father    Hypertension Brother    Diabetes Sister    Coronary artery disease Neg Hx    Stroke Neg Hx    Cancer Neg Hx    Kidney disease Paternal Grandmother        ESRD    Past Surgical History:  Procedure Laterality Date   AMPUTATION Right 09/17/2021   Procedure: First ray amputation;  Surgeon: Newt Minion, MD;  Location: [place];  Service: Orthopedics;  Laterality: Right;   CARDIOVERSION N/A 01/31/2021   Procedure: CARDIOVERSION;  Surgeon:  Jolaine Artist, MD;  Location: [place];  Service: Cardiovascular;  Laterality: N/A;   CARDIOVERSION N/A 07/04/2021   Procedure: CARDIOVERSION;  Surgeon: Jolaine Artist, MD;  Location: [facility] ENDOSCOPY;  Service: Cardiovascular;  Laterality: N/A;   hospitalization  11/2008   malignant HTN, nl SPEP/UPEP, neg ANCA panel, nl C3/4, neg anti GBM Ab, neg Hep A/B/C, nl renal US, nl PTH, neg HIV   RIGHT/LEFT HEART CATH  AND CORONARY ANGIOGRAPHY N/A 01/29/2021   Procedure: RIGHT/LEFT HEART CATH AND CORONARY ANGIOGRAPHY;  Surgeon: Jolaine Artist, MD;  Location: [place] CV LAB;  Service: Cardiovascular;  Laterality: N/A;   TEE WITHOUT CARDIOVERSION N/A 01/31/2021   Procedure: TRANSESOPHAGEAL ECHOCARDIOGRAM (TEE);  Surgeon: Jolaine Artist, MD;  Location: [place];  Service: Cardiovascular;  Laterality: N/A;   US ECHOCARDIOGRAPHY  11/2008   LVsys fxn EF 50%, mild MR, normal LV size, neg ANA, neg cryoglobulins   US ECHOCARDIOGRAPHY  2011   severe LVH, EF 40%, LA mildly dilated, PA pressure moderately increased   Social History   Occupational History   Occupation: Tax Forensic psychologist    Comment: [employer]  Tobacco Use   Smoking status: Never   Smokeless tobacco: Never   Tobacco comments:    monthly, when going out  Vaping Use   Vaping Use: Never used  Substance and Sexual Activity   Alcohol use: Yes    Comment: Occasional   Drug use: No   Sexual activity: Not on file

## 2021-10-08 ENCOUNTER — Encounter (HOSPITAL_COMMUNITY): Payer: Self-pay | Admitting: Orthopedic Surgery

## 2021-10-08 ENCOUNTER — Other Ambulatory Visit: Payer: Self-pay

## 2021-10-08 NOTE — Progress Notes (Signed)
PCP - Denies- current practice closed, he is currently looking  for a new one.  Cardiologist - Dr. Gala Romney  EP- Denies  Endocrine- Denies  Pulm- Denies  Chest x-ray - 01/26/21 (E)  EKG - 07/04/21 (E)  Stress Test - Denies  ECHO - 07/02/21 (E)  Cardiac Cath - 01/29/21 (E)  AICD-na PM-na LOOP-na  Nerve Stimulator- Denies  Dialysis- Denies  Sleep Study - Denies CPAP - Denies  LABS- 09/19/21(E): CBC, BMP  ASA- Denies ELIQUIS- LD- 6/13  ERAS- Yes until 0720  HA1C- 09/14/21(E): 10.1 Fasting Blood Sugar - 60-85 Checks Blood Sugar ___1__ times a day  Anesthesia- Yes- elevated A1C  Pt denies having chest pain, sob, or fever during the pre-op phone call. All instructions explained to the pt, with a verbal understanding of the material including: as of today, stop taking all Aspirin (unless instructed by your doctor) and Other Aspirin containing products, Vitamins, Fish oils, and Herbal medications. Also stop all NSAIDS i.e. Advil, Ibuprofen, Motrin, Aleve, Anaprox, Naproxen, BC, Goody Powders, and all Supplements.   WHAT DO I DO ABOUT MY DIABETES MEDICATION?  Take last dose of Jardiance on 6/14 Do not take Metformin the morning of surgery.  If your CBG is greater than 220 mg/dL, inform the staff upon arrival to Short Stay.  How do I manage my blood sugar before surgery? Check your blood sugar the morning of your surgery when you wake up and every 2 hours until you get to the Short Stay unit. If your blood sugar is less than 70 mg/dL, you will need to treat for low blood sugar: Do not take insulin. Treat a low blood sugar (less than 70 mg/dL) with  cup of clear juice (cranberry or apple), 4 glucose tablets, OR glucose gel. Recheck blood sugar in 15 minutes after treatment (to make sure it is greater than 70 mg/dL). If your blood sugar is not greater than 70 mg/dL on recheck, call [phone]  for further instructions. Report your blood sugar to the short stay nurse when  you get to Short Stay.  Reviewed and Endorsed by [facility] Patient Education Committee, August 2015  Pt also instructed to wear a mask and social distance if he goes out. The opportunity to ask questions was provided.

## 2021-10-09 ENCOUNTER — Encounter (HOSPITAL_COMMUNITY): Payer: 59

## 2021-10-09 NOTE — Progress Notes (Signed)
Anesthesia Chart Review: Same day workup  Follows with heart failure clinic for history of nonischemic cardiomyopathy with subsequent recovery of EF, paroxysmal A-fib s/p DCCV 01/2021 with recurrent AF 06/2021 now on amiodarone and Eliquis.  LHC 01/2021 showed mild nonobstructive CAD.  Suspected cardiomyopathy secondary to hypertension.  Echo 01/2021 showed EF 30 to 35% with severe LVH.  Follow-up echo 06/2021 showed EF 55 to 60%.  Last seen by Dr. Gala Romney 09/19/2021 during hospitalization for osteomyelitis of right great toe now s/p toe amputation, cellulitis of right lower leg, AKI on CKD.  At that time, he was noted to be NYHA class I, euvolemic, not requiring loop diuretic.  He is maintained on Entresto, Jardiance, BiDil, Toprol XL, spironolactone.    OSA on CPAP.  CKD 3B  Non-insulin-dependent DM2, uncontrolled, last A1c 10.15 2123.  BMP and CBC from 09/19/2021 reviewed.  Creatinine elevated 1.89 consistent with history of CKD 3B, mild anemia with hemoglobin 11.4, stable, labs otherwise unremarkable.  Patient will need day of surgery evaluation.  EKG 07/04/2021: Sinus rhythm with first-degree AV block.  Minimal voltage criteria for LVH, may be normal variant.  Prolonged QT (QTcB 493).  TTE 07/02/2021:  1. Left ventricular ejection fraction, by estimation, is 55%. The left  ventricle has normal function. The left ventricle has no regional wall  motion abnormalities. The left ventricular internal cavity size was mildly  dilated. There is severe concentric  left ventricular hypertrophy. Left ventricular diastolic parameters are  indeterminate.   2. Right ventricular systolic function is mildly reduced. The right  ventricular size is normal.   3. Left atrial size was severely dilated.   4. Right atrial size was mildly dilated.   5. The mitral valve is normal in structure. Trivial mitral valve  regurgitation. No evidence of mitral stenosis.   6. Tricuspid valve regurgitation is mild to  moderate.   7. The aortic valve is normal in structure. Aortic valve regurgitation is  not visualized. Aortic valve sclerosis/calcification is present, without  any evidence of aortic stenosis.   8. Pulmonic valve regurgitation is moderate.   9. The inferior vena cava is dilated in size with <50% respiratory  variability, suggesting right atrial pressure of 15 mmHg.   Right/left heart cath 01/29/2021:   Prox RCA lesion is 20% stenosed.   Mid Cx to Dist Cx lesion is 30% stenosed.   Prox LAD to Mid LAD lesion is 30% stenosed.   Findings:   Ao = 163/108 (134) LV = 136/25 RA =  15 RV = 51/13 PA = 57/19 (39) PCW = 28 Fick cardiac output/index = 9.2/3.0 PVR = 1.2 WU Ao sat = 94% PA sat = 65%, 65%   Assessment:   1. Mild non-obstructive CAD 2. NICM (likely hypertensive) with EF 35% by echo 3. Elevated filling pressures with normal CO 4. Severe systemic HTN   Plan/Discussion:   Resume diuresis.  Continue titration of GDMT.     Zannie Cove [facility]/Anesthesiology Phone [phone] 10/09/2021 9:16 AM

## 2021-10-09 NOTE — Anesthesia Preprocedure Evaluation (Addendum)
Anesthesia Evaluation  Patient identified by MRN, date of birth, ID band Patient awake    Reviewed: Allergy & Precautions, NPO status , Patient's Chart, lab work & pertinent test results  Airway        Dental   Pulmonary former smoker,           Cardiovascular hypertension, +CHF  + dysrhythmias Atrial Fibrillation   EF 55-60%   Neuro/Psych  Neuromuscular disease CVA    GI/Hepatic   Endo/Other  diabetes, Type obesity  Renal/GU Renal disease     Musculoskeletal   Abdominal   Peds  Hematology   Anesthesia Other Findings   Reproductive/Obstetrics                            Anesthesia Physical Anesthesia Plan  ASA: 3  Anesthesia Plan: General   Post-op Pain Management:    Induction: Intravenous  PONV Risk Score and Plan: 2 and Ondansetron, Dexamethasone, Midazolam and Treatment may vary due to age or medical condition  Airway Management Planned: LMA  Additional Equipment:   Intra-op Plan:   Post-operative Plan: Extubation in OR  Informed Consent: I have reviewed the patients History and Physical, chart, labs and discussed the procedure including the risks, benefits and alternatives for the proposed anesthesia with the patient or authorized representative who has indicated his/her understanding and acceptance.     Dental advisory given  Plan Discussed with: CRNA, Anesthesiologist and Surgeon  Anesthesia Plan Comments: (PAT note by Antionette Poles, PA-C: Follows with heart failure clinic for history of nonischemic cardiomyopathy with subsequent recovery of EF, paroxysmal A-fib s/p DCCV 01/2021 with recurrent AF 06/2021 now on amiodarone and Eliquis.  LHC 01/2021 showed mild nonobstructive CAD.  Suspected cardiomyopathy secondary to hypertension.  Echo 01/2021 showed EF 30 to 35% with severe LVH.  Follow-up echo 06/2021 showed EF 55 to 60%.  Last seen by Dr. 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  At that time, he was noted to be NYHA class I, euvolemic, not requiring loop diuretic.  He is maintained on Entresto, Jardiance, BiDil, Toprol XL, spironolactone.    OSA on CPAP.  CKD 3B  Non-insulin-dependent DM2, uncontrolled, last A1c 10.15 2123.  BMP and CBC from 09/19/2021 reviewed.  Creatinine elevated 1.89 consistent with history of CKD 3B, mild anemia with hemoglobin 11.4, stable, labs otherwise unremarkable.  Patient will need day of surgery evaluation.  EKG 07/04/2021: Sinus rhythm with first-degree AV block.  Minimal voltage criteria for LVH, may be normal variant.  Prolonged QT (QTcB 493).  TTE 07/02/2021: 1. Left ventricular ejection fraction, by estimation, is 55%. The left  ventricle has normal function. The left ventricle has no regional wall  motion abnormalities. The left ventricular internal cavity size was mildly  dilated. There is severe concentric  left ventricular hypertrophy. Left ventricular diastolic parameters are  indeterminate.  2. Right ventricular systolic function is mildly reduced. The right  ventricular size is normal.  3. Left atrial size was severely dilated.  4. Right atrial size was mildly dilated.  5. The mitral valve is normal in structure. Trivial mitral valve  regurgitation. No evidence of mitral stenosis.  6. Tricuspid valve regurgitation is mild to moderate.  7. The aortic valve is normal in structure. Aortic valve regurgitation is  not visualized. Aortic valve sclerosis/calcification is present, without  any evidence of aortic  stenosis.  8. Pulmonic valve regurgitation is moderate.  9. The inferior vena cava is dilated in size with <50% respiratory  variability, suggesting right atrial pressure of 15 mmHg.   Right/left heart cath 01/29/2021: . Prox RCA lesion is 20% stenosed. . Mid Cx to Dist Cx lesion  is 30% stenosed. . Prox LAD to Mid LAD lesion is 30% stenosed.  Findings:  Ao = 163/108 (134) LV = 136/25 RA = 15 RV = 51/13 PA = 57/19 (39) PCW = 28 Fick cardiac output/index = 9.2/3.0 PVR = 1.2 WU Ao sat = 94% PA sat = 65%, 65%  Assessment:  1. Mild non-obstructive CAD 2. NICM (likely hypertensive) with EF 35% by echo 3. Elevated filling pressures with normal CO 4. Severe systemic HTN  Plan/Discussion:  Resume diuresis. Continue titration of GDMT.  )       Anesthesia Quick Evaluation

## 2021-10-09 NOTE — Progress Notes (Signed)
Called patient to update arrival time left voicemail with new arrival time of 0715, ERAS until [phone]

## 2021-10-10 ENCOUNTER — Ambulatory Visit (HOSPITAL_BASED_OUTPATIENT_CLINIC_OR_DEPARTMENT_OTHER): Payer: 59 | Admitting: Physician Assistant

## 2021-10-10 ENCOUNTER — Encounter (HOSPITAL_COMMUNITY)
Admission: RE | Disposition: A | Discharge status: Home / Self Care | Payer: Self-pay | Source: Home / Self Care | Attending: Orthopedic Surgery

## 2021-10-10 ENCOUNTER — Other Ambulatory Visit: Payer: Self-pay

## 2021-10-10 ENCOUNTER — Ambulatory Visit (HOSPITAL_COMMUNITY): Payer: 59 | Admitting: Physician Assistant

## 2021-10-10 ENCOUNTER — Ambulatory Visit (HOSPITAL_COMMUNITY)
Admission: RE | Admit: 2021-10-10 | Discharge: 2021-10-10 | Disposition: A | Discharge status: Home / Self Care | Payer: 59 | Attending: Orthopedic Surgery | Admitting: Orthopedic Surgery

## 2021-10-10 DIAGNOSIS — Z8673 Personal history of transient ischemic attack (TIA), and cerebral infarction without residual deficits: Secondary | ICD-10-CM | POA: Diagnosis not present

## 2021-10-10 DIAGNOSIS — I4891 Unspecified atrial fibrillation: Secondary | ICD-10-CM

## 2021-10-10 DIAGNOSIS — Z7984 Long term (current) use of oral hypoglycemic drugs: Secondary | ICD-10-CM | POA: Insufficient documentation

## 2021-10-10 DIAGNOSIS — E1122 Type 2 diabetes mellitus with diabetic chronic kidney disease: Secondary | ICD-10-CM | POA: Diagnosis not present

## 2021-10-10 DIAGNOSIS — I428 Other cardiomyopathies: Secondary | ICD-10-CM | POA: Diagnosis not present

## 2021-10-10 DIAGNOSIS — I129 Hypertensive chronic kidney disease with stage 1 through stage 4 chronic kidney disease, or unspecified chronic kidney disease: Secondary | ICD-10-CM | POA: Diagnosis not present

## 2021-10-10 DIAGNOSIS — Y835 Amputation of limb(s) as the cause of abnormal reaction of the patient, or of later complication, without mention of misadventure at the time of the procedure: Secondary | ICD-10-CM | POA: Diagnosis not present

## 2021-10-10 DIAGNOSIS — I2510 Atherosclerotic heart disease of native coronary artery without angina pectoris: Secondary | ICD-10-CM | POA: Insufficient documentation

## 2021-10-10 DIAGNOSIS — Z89411 Acquired absence of right great toe: Secondary | ICD-10-CM | POA: Diagnosis not present

## 2021-10-10 DIAGNOSIS — I96 Gangrene, not elsewhere classified: Secondary | ICD-10-CM | POA: Diagnosis not present

## 2021-10-10 DIAGNOSIS — I480 Paroxysmal atrial fibrillation: Secondary | ICD-10-CM | POA: Insufficient documentation

## 2021-10-10 DIAGNOSIS — E1152 Type 2 diabetes mellitus with diabetic peripheral angiopathy with gangrene: Secondary | ICD-10-CM | POA: Insufficient documentation

## 2021-10-10 DIAGNOSIS — T8781 Dehiscence of amputation stump: Secondary | ICD-10-CM | POA: Insufficient documentation

## 2021-10-10 DIAGNOSIS — Z7901 Long term (current) use of anticoagulants: Secondary | ICD-10-CM | POA: Insufficient documentation

## 2021-10-10 DIAGNOSIS — Z6841 Body Mass Index (BMI) 40.0 and over, adult: Secondary | ICD-10-CM | POA: Diagnosis not present

## 2021-10-10 DIAGNOSIS — Z87891 Personal history of nicotine dependence: Secondary | ICD-10-CM | POA: Diagnosis not present

## 2021-10-10 DIAGNOSIS — I509 Heart failure, unspecified: Secondary | ICD-10-CM | POA: Diagnosis not present

## 2021-10-10 DIAGNOSIS — I110 Hypertensive heart disease with heart failure: Secondary | ICD-10-CM

## 2021-10-10 DIAGNOSIS — L02611 Cutaneous abscess of right foot: Secondary | ICD-10-CM

## 2021-10-10 DIAGNOSIS — Z79899 Other long term (current) drug therapy: Secondary | ICD-10-CM | POA: Diagnosis not present

## 2021-10-10 DIAGNOSIS — N1832 Chronic kidney disease, stage 3b: Secondary | ICD-10-CM | POA: Diagnosis not present

## 2021-10-10 DIAGNOSIS — G4733 Obstructive sleep apnea (adult) (pediatric): Secondary | ICD-10-CM | POA: Insufficient documentation

## 2021-10-10 DIAGNOSIS — E1165 Type 2 diabetes mellitus with hyperglycemia: Secondary | ICD-10-CM | POA: Insufficient documentation

## 2021-10-10 DIAGNOSIS — Z792 Long term (current) use of antibiotics: Secondary | ICD-10-CM | POA: Diagnosis not present

## 2021-10-10 DIAGNOSIS — T8189XA Other complications of procedures, not elsewhere classified, initial encounter: Secondary | ICD-10-CM

## 2021-10-10 HISTORY — PX: I & D EXTREMITY: SHX5045

## 2021-10-10 HISTORY — PX: APPLICATION OF WOUND VAC: SHX5189

## 2021-10-10 LAB — GLUCOSE, CAPILLARY
Glucose-Capillary: 163 mg/dL — ABNORMAL HIGH (ref 70–99)
Glucose-Capillary: 143 mg/dL — ABNORMAL HIGH (ref 70–99)

## 2021-10-10 SURGERY — IRRIGATION AND DEBRIDEMENT EXTREMITY
Anesthesia: Monitor Anesthesia Care | Site: Foot | Laterality: Right

## 2021-10-10 MED ORDER — PROPOFOL 10 MG/ML IV BOLUS
INTRAVENOUS | Status: DC | PRN
  Administered 2021-10-10: 50 mg via INTRAVENOUS

## 2021-10-10 MED ORDER — MIDAZOLAM HCL 2 MG/2ML IJ SOLN
INTRAMUSCULAR | Status: AC
Start: 2021-10-10 — End: ?
  Filled 2021-10-10: qty 2

## 2021-10-10 MED ORDER — MIDAZOLAM HCL 2 MG/2ML IJ SOLN
INTRAMUSCULAR | Status: AC
  Administered 2021-10-10: 2 mg via INTRAVENOUS
  Filled 2021-10-10: qty 2

## 2021-10-10 MED ORDER — INSULIN ASPART 100 UNIT/ML IJ SOLN: 0.0000 [IU] | INTRAMUSCULAR | Status: DC | PRN

## 2021-10-10 MED ORDER — PHENYLEPHRINE 80 MCG/ML (10ML) SYRINGE FOR IV PUSH (FOR BLOOD PRESSURE SUPPORT)
PREFILLED_SYRINGE | INTRAVENOUS | Status: DC | PRN
  Administered 2021-10-10: 80 ug via INTRAVENOUS

## 2021-10-10 MED ORDER — CHLORHEXIDINE GLUCONATE 0.12 % MT SOLN
15.0000 mL | Freq: Once | OROMUCOSAL | Status: AC
  Administered 2021-10-10: 15 mL via OROMUCOSAL
  Filled 2021-10-10: qty 15

## 2021-10-10 MED ORDER — FENTANYL CITRATE (PF) 250 MCG/5ML IJ SOLN
INTRAMUSCULAR | Status: AC
  Filled 2021-10-10: qty 5

## 2021-10-10 MED ORDER — OXYCODONE HCL 5 MG/5ML PO SOLN: 5.0000 mg | Freq: Once | ORAL | Status: DC | PRN

## 2021-10-10 MED ORDER — INSULIN ASPART 100 UNIT/ML IJ SOLN
INTRAMUSCULAR | Status: AC
  Filled 2021-10-10: qty 1

## 2021-10-10 MED ORDER — FENTANYL CITRATE (PF) 100 MCG/2ML IJ SOLN
INTRAMUSCULAR | Status: AC
  Administered 2021-10-10: 100 ug via INTRAVENOUS
  Filled 2021-10-10: qty 2

## 2021-10-10 MED ORDER — OXYCODONE-ACETAMINOPHEN 5-325 MG PO TABS: 1.0000 | ORAL_TABLET | ORAL | 0 refills | Status: DC | PRN

## 2021-10-10 MED ORDER — CEFAZOLIN IN SODIUM CHLORIDE 3-0.9 GM/100ML-% IV SOLN
3.0000 g | INTRAVENOUS | Status: AC
  Administered 2021-10-10: 3 g via INTRAVENOUS
  Filled 2021-10-10: qty 100

## 2021-10-10 MED ORDER — MIDAZOLAM HCL 2 MG/2ML IJ SOLN
2.0000 mg | Freq: Once | INTRAMUSCULAR | Status: AC
  Filled 2021-10-10: qty 2

## 2021-10-10 MED ORDER — PROPOFOL 10 MG/ML IV BOLUS
INTRAVENOUS | Status: AC
  Filled 2021-10-10: qty 20

## 2021-10-10 MED ORDER — PROPOFOL 500 MG/50ML IV EMUL
INTRAVENOUS | Status: DC | PRN
  Administered 2021-10-10: 100 ug/kg/min via INTRAVENOUS

## 2021-10-10 MED ORDER — PROPOFOL 1000 MG/100ML IV EMUL
INTRAVENOUS | Status: AC
  Filled 2021-10-10: qty 100

## 2021-10-10 MED ORDER — OXYCODONE HCL 5 MG PO TABS: 5.0000 mg | ORAL_TABLET | Freq: Once | ORAL | Status: DC | PRN

## 2021-10-10 MED ORDER — 0.9 % SODIUM CHLORIDE (POUR BTL) OPTIME
TOPICAL | Status: DC | PRN
  Administered 2021-10-10: 1000 mL

## 2021-10-10 MED ORDER — LACTATED RINGERS IV SOLN: INTRAVENOUS | Status: DC

## 2021-10-10 MED ORDER — PHENYLEPHRINE HCL-NACL 20-0.9 MG/250ML-% IV SOLN
INTRAVENOUS | Status: DC | PRN
  Administered 2021-10-10: 10 ug/min via INTRAVENOUS

## 2021-10-10 MED ORDER — ROPIVACAINE HCL 5 MG/ML IJ SOLN
INTRAMUSCULAR | Status: DC | PRN
  Administered 2021-10-10: 30 mL via PERINEURAL

## 2021-10-10 MED ORDER — FENTANYL CITRATE (PF) 100 MCG/2ML IJ SOLN: 25.0000 ug | INTRAMUSCULAR | Status: DC | PRN

## 2021-10-10 MED ORDER — FENTANYL CITRATE (PF) 100 MCG/2ML IJ SOLN: 100.0000 ug | Freq: Once | INTRAMUSCULAR | Status: AC

## 2021-10-10 MED ORDER — SODIUM CHLORIDE 0.9 % IR SOLN
Status: DC | PRN
  Administered 2021-10-10: 3000 mL

## 2021-10-10 MED ORDER — ONDANSETRON HCL 4 MG/2ML IJ SOLN: 4.0000 mg | Freq: Four times a day (QID) | INTRAMUSCULAR | Status: DC | PRN

## 2021-10-10 MED ORDER — ORAL CARE MOUTH RINSE: 15.0000 mL | Freq: Once | OROMUCOSAL | Status: AC

## 2021-10-10 SURGICAL SUPPLY — 42 items
BAG COUNTER SPONGE SURGICOUNT (BAG) IMPLANT
BLADE SURG 21 STRL SS (BLADE) ×3 IMPLANT
BNDG COHESIVE 3X5 TAN ST LF (GAUZE/BANDAGES/DRESSINGS) ×1 IMPLANT
BNDG COHESIVE 6X5 TAN STRL LF (GAUZE/BANDAGES/DRESSINGS) IMPLANT
BNDG ELASTIC 4X5.8 VLCR STR LF (GAUZE/BANDAGES/DRESSINGS) ×1 IMPLANT
BNDG GAUZE ELAST 4 BULKY (GAUZE/BANDAGES/DRESSINGS) ×4 IMPLANT
COVER MAYO STAND STRL (DRAPES) ×1 IMPLANT
COVER SURGICAL LIGHT HANDLE (MISCELLANEOUS) ×4 IMPLANT
DRAPE DERMATAC (DRAPES) ×4 IMPLANT
DRAPE U-SHAPE 47X51 STRL (DRAPES) ×2 IMPLANT
DRESSING PEEL AND PLAC PRVNA20 (GAUZE/BANDAGES/DRESSINGS) IMPLANT
DRESSING PREVENA PLUS CUSTOM (GAUZE/BANDAGES/DRESSINGS) IMPLANT
DRSG ADAPTIC 3X8 NADH LF (GAUZE/BANDAGES/DRESSINGS) ×2 IMPLANT
DRSG PEEL AND PLACE PREVENA 20 (GAUZE/BANDAGES/DRESSINGS) ×2
DRSG PREVENA PLUS CUSTOM (GAUZE/BANDAGES/DRESSINGS) ×2
DURAPREP 26ML APPLICATOR (WOUND CARE) ×2 IMPLANT
ELECT REM PT RETURN 9FT ADLT (ELECTROSURGICAL)
ELECTRODE REM PT RTRN 9FT ADLT (ELECTROSURGICAL) IMPLANT
GAUZE SPONGE 4X4 12PLY STRL (GAUZE/BANDAGES/DRESSINGS) ×2 IMPLANT
GLOVE BIOGEL PI IND STRL 9 (GLOVE) ×1 IMPLANT
GLOVE BIOGEL PI INDICATOR 9 (GLOVE) ×1
GLOVE SURG ORTHO 9.0 STRL STRW (GLOVE) ×2 IMPLANT
GOWN STRL REUS W/ TWL XL LVL3 (GOWN DISPOSABLE) ×2 IMPLANT
GOWN STRL REUS W/TWL XL LVL3 (GOWN DISPOSABLE) ×2
GRAFT SKIN WND MICRO 38 (Tissue) ×1 IMPLANT
GRAFT SKIN WND SURGIBIND 3X7 (Tissue) ×1 IMPLANT
HANDPIECE INTERPULSE COAX TIP (DISPOSABLE)
KIT BASIN OR (CUSTOM PROCEDURE TRAY) ×2 IMPLANT
KIT TURNOVER KIT B (KITS) ×2 IMPLANT
MANIFOLD NEPTUNE II (INSTRUMENTS) ×2 IMPLANT
NS IRRIG 1000ML POUR BTL (IV SOLUTION) ×2 IMPLANT
PACK ORTHO EXTREMITY (CUSTOM PROCEDURE TRAY) ×2 IMPLANT
PAD ARMBOARD 7.5X6 YLW CONV (MISCELLANEOUS) ×4 IMPLANT
SET HNDPC FAN SPRY TIP SCT (DISPOSABLE) IMPLANT
SET INTERPULSE LAVAGE W/TIP (ORTHOPEDIC DISPOSABLE SUPPLIES) ×1 IMPLANT
STOCKINETTE IMPERVIOUS 9X36 MD (GAUZE/BANDAGES/DRESSINGS) IMPLANT
SUT ETHILON 2 0 PSLX (SUTURE) ×4 IMPLANT
SWAB COLLECTION DEVICE MRSA (MISCELLANEOUS) ×2 IMPLANT
SWAB CULTURE ESWAB REG 1ML (MISCELLANEOUS) IMPLANT
TOWEL GREEN STERILE (TOWEL DISPOSABLE) ×2 IMPLANT
TUBE CONNECTING 12X1/4 (SUCTIONS) ×3 IMPLANT
YANKAUER SUCT BULB TIP NO VENT (SUCTIONS) ×3 IMPLANT

## 2021-10-10 NOTE — Progress Notes (Signed)
Late entry 0830  Patient did not take his Metoprolol today, Dr. Chaney Malling made aware

## 2021-10-10 NOTE — Progress Notes (Signed)
Pt in phase 2 of care in the PACU. Pt alert and stable. Cardiac monitoring is no longer needed. Pt has no c/o pain or discomfort. Pt is waiting on CM to provide home equipment before discharge.

## 2021-10-10 NOTE — Op Note (Signed)
10/10/2021  10:01 AM  PATIENT:  Randy [name]    PRE-OPERATIVE DIAGNOSIS:  Necrotic Wound Right Foot  POST-OPERATIVE DIAGNOSIS:  Same  PROCEDURE:  RIGHT FOOT DEBRIDEMENT Excision of skin and soft tissue muscle and excision of the medial cuneiform. Application of Kerecis 38 cm micro graft and 15 cm micro sheath. Local tissue rearrangement for wound closure 10 x 5 cm. ,APPLICATION OF WOUND VAC  SURGEON:  Nadara Mustard, MD  PHYSICIAN ASSISTANT:None ANESTHESIA:   General  PREOPERATIVE INDICATIONS:  Randy [name] is a  51 y.o. male with a diagnosis of Necrotic Wound Right Foot who failed conservative measures and elected for surgical management.    The risks benefits and alternatives were discussed with the patient preoperatively including but not limited to the risks of infection, bleeding, nerve injury, cardiopulmonary complications, the need for revision surgery, among others, and the patient was willing to proceed.  OPERATIVE IMPLANTS: Kerecis micro powder 38 cm, Kerecis micro she 3 x 15 cm.  @ENCIMAGES @  OPERATIVE FINDINGS: Patient had a large hematoma with extensive nonviable soft tissue this was excised tissue margins were healthy and viable at time of closure.  OPERATIVE PROCEDURE: Patient was brought the operating room after undergoing a regional anesthetic.  The right lower extremity was prepped using DuraPrep draped in a sterile field a timeout was called.  Elliptical incision was made around the area of wound dehiscence this left a wound that was 5 x 10 cm.  Rondure was used to further excise nonviable soft tissue.  The medial cuneiform was excised this was involved and the nonviable soft tissue.  The wound was irrigated with normal saline electrocautery was used hemostasis.  The wound bed was filled with 38 cm micro powder this was covered with a 5 x 3 cm sheet.  Local tissue rearrangement with undermining and extension of the incision was used to close the wound 10 x 5 cm  with 2-0 nylon.  The 20 cm Prevena was applied this had a good suction fit patient was taken the PACU in stable condition.   DISCHARGE PLANNING:  Antibiotic duration: Preoperative antibiotics  Weightbearing: Nonweightbearing on the right  Pain medication: Prescription for Percocet  Dressing care/ Wound VAC: Continue wound VAC for 1 week  Ambulatory devices: Crutches  Discharge to: Home.  Follow-up: In the office 1 week post operative.

## 2021-10-10 NOTE — H&P (Signed)
Randy [name] is an 51 y.o. male.   Chief Complaint: Chronic wound right foot. HPI: Patient is a 51 year old gentleman who is 3 weeks status post right foot first ray amputation he is currently on doxycycline and has been on Eliquis for atrial fibrillation.  Patient has had progressive wound dehiscence secondary to bleeding from the blood thinner.  Patient was given a walker but due to his size and height its been difficult for him to use the walker.  Patient did go to the emergency room last night thinking that a stitch had popped out.  Patient reports increasing bleeding from the wound for 3 hours.  Past Medical History:  Diagnosis Date   CKD (chronic kidney disease) stage 3, GFR 30-59 ml/min (HCC)    baseline Cr 1.5-1.7   Decreased visual acuity    Left eye, resolved - hypertensive retinopathy   Diabetes mellitus 2012   Type II   Hilar adenopathy    on CT scan 02/2010, on rpt scan stable/improved.   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[place])    echo 2011 with nonischemic hypertensive cardiomyopathy   Systolic murmur    Vitamin D deficiency     Past Surgical History:  Procedure Laterality Date   AMPUTATION Right 09/17/2021   Procedure: First ray amputation;  Surgeon: Newt Minion, MD;  Location: [place];  Service: Orthopedics;  Laterality: Right;   CARDIOVERSION N/A 01/31/2021   Procedure: CARDIOVERSION;  Surgeon: Jolaine Artist, MD;  Location: [facility] ENDOSCOPY;  Service: Cardiovascular;  Laterality: N/A;   CARDIOVERSION N/A 07/04/2021   Procedure: CARDIOVERSION;  Surgeon: Jolaine Artist, MD;  Location: [facility] ENDOSCOPY;  Service: Cardiovascular;  Laterality: N/A;   hospitalization  11/2008   malignant HTN, nl SPEP/UPEP, neg ANCA panel, nl C3/4, neg  anti GBM Ab, neg Hep A/B/C, nl renal US, nl PTH, neg HIV   RIGHT/LEFT HEART CATH AND CORONARY ANGIOGRAPHY N/A 01/29/2021   Procedure: RIGHT/LEFT HEART CATH AND CORONARY ANGIOGRAPHY;  Surgeon: Jolaine Artist, MD;  Location: [place] CV LAB;  Service: Cardiovascular;  Laterality: N/A;   TEE WITHOUT CARDIOVERSION N/A 01/31/2021   Procedure: TRANSESOPHAGEAL ECHOCARDIOGRAM (TEE);  Surgeon: Jolaine Artist, MD;  Location: [facility] ENDOSCOPY;  Service: Cardiovascular;  Laterality: N/A;   US ECHOCARDIOGRAPHY  11/2008   LVsys fxn EF 50%, mild MR, normal LV size, neg ANA, neg cryoglobulins   US ECHOCARDIOGRAPHY  2011   severe LVH, EF 40%, LA mildly dilated, PA pressure moderately increased    Family History  Problem Relation Age of Onset   Hypertension Mother    Hypertension Father    Diabetes Father    Hypertension Brother    Diabetes Sister    Coronary artery disease Neg Hx    Stroke Neg Hx    Cancer Neg Hx    Kidney disease Paternal Grandmother        ESRD   Social History:  reports that he has quit smoking. His smoking use included cigars. He has never used smokeless tobacco. He reports current alcohol use. He reports that he does not use drugs.  Allergies:  Allergies  Allergen Reactions   Fish-Derived Products Anaphylaxis    No  medications prior to admission.    No results found for this or any previous visit (from the past 48 hour(s)). No results found.  Review of Systems  All other systems reviewed and are negative.   Height 6' (1.829 m), weight (!) 165.6 kg. Physical Exam  Patient is alert, oriented, no adenopathy, well-dressed, normal affect, normal respiratory effort. Examination patient has a palpable dorsalis pedis and posterior tibial pulse the Doppler was used and he has a strong triphasic dorsalis pedis and posterior tibial pulse the rate is controlled.  There is no ascending cellulitis no purulent drainage there is some resolving hematoma. Assessment/Plan 1.  Amputated great toe of right foot ([place])   2. Wound dehiscence, surgical, initial encounter       Plan: Discussed with the patient there is increased skin necrosis secondary to pressure from a hematoma from his blood thinner.  We will plan to proceed with surgery on Friday for wound debridement and placement of tissue graft placement of a cleanse choice wound VAC sponge with discharge to home.  Discussed the importance of minimizing weightbearing we will give him a Darco shoe.  He will hold on the blood thinner at this time he does have A-fib with well-controlled rate.  Newt Minion, MD 10/10/2021, 6:43 AM

## 2021-10-10 NOTE — Anesthesia Procedure Notes (Signed)
Anesthesia Regional Block: Popliteal block   Pre-Anesthetic Checklist: , timeout performed,  Correct Patient, Correct Site, Correct Laterality,  Correct Procedure, Correct Position, site marked,  Risks and benefits discussed,  Surgical consent,  Pre-op evaluation,  At surgeon's request and post-op pain management  Laterality: Right  Prep: chloraprep       Needles:  Injection technique: Single-shot  Needle Type: Echogenic Stimulator Needle          Additional Needles:   Procedures:, nerve stimulator,,,,,     Nerve Stimulator or Paresthesia:  Response: plantar flexion of foot, 0.45 mA  Additional Responses:   Narrative:  Start time: 10/10/2021 9:04 AM End time: 10/10/2021 9:14 AM Injection made incrementally with aspirations every 5 mL.  Performed by: Personally  Anesthesiologist: Achille Rich, MD  Additional Notes: Functioning IV was confirmed and monitors were applied.  A 2mm 21ga Arrow echogenic stimulator needle was used. Sterile prep and drape,hand hygiene and sterile gloves were used.  Negative aspiration and negative test dose prior to incremental administration of local anesthetic. The patient tolerated the procedure well.  Ultrasound guidance: relevent anatomy identified, needle position confirmed, local anesthetic spread visualized around nerve(s), vascular puncture avoided.  Image printed for medical record.

## 2021-10-10 NOTE — Transfer of Care (Signed)
Immediate Anesthesia Transfer of Care Note  Patient: Randy [name]  Procedure(s) Performed: RIGHT FOOT DEBRIDEMENT (Right: Foot) APPLICATION OF WOUND VAC (Right: Foot)  Patient Location: PACU  Anesthesia Type:MAC and Regional  Level of Consciousness: awake  Airway & Oxygen Therapy: Patient Spontanous Breathing  Post-op Assessment: Report given to RN and Post -op Vital signs reviewed and stable  Post vital signs: Reviewed and stable  Last Vitals:  Vitals Value Taken Time  BP    Temp    Pulse    Resp    SpO2      Last Pain:  Vitals:   10/10/21 0837  TempSrc:   PainSc: 0-No pain      Patients Stated Pain Goal: 3 (96/43/83 8184)  Complications: No notable events documented.

## 2021-10-12 ENCOUNTER — Encounter (HOSPITAL_COMMUNITY): Payer: Self-pay | Admitting: Orthopedic Surgery

## 2021-10-13 ENCOUNTER — Encounter: Payer: Self-pay | Admitting: Orthopedic Surgery

## 2021-10-13 ENCOUNTER — Telehealth: Payer: Self-pay

## 2021-10-13 ENCOUNTER — Ambulatory Visit (INDEPENDENT_AMBULATORY_CARE_PROVIDER_SITE_OTHER): Payer: 59 | Admitting: Orthopedic Surgery

## 2021-10-13 DIAGNOSIS — S98111A Complete traumatic amputation of right great toe, initial encounter: Secondary | ICD-10-CM

## 2021-10-13 DIAGNOSIS — T8131XA Disruption of external operation (surgical) wound, not elsewhere classified, initial encounter: Secondary | ICD-10-CM

## 2021-10-13 DIAGNOSIS — Z89411 Acquired absence of right great toe: Secondary | ICD-10-CM

## 2021-10-13 NOTE — Telephone Encounter (Signed)
Patient contacted the office today stating that wound vac has not been working since Saturday 10/11/2021. Patient is s/p right foot debridement preformed on 10/10/2021.

## 2021-10-13 NOTE — Progress Notes (Signed)
Office Visit Note   Patient: Randy [name]           Date of Birth: [birth year]           MRN: [id number] Visit Date: 10/13/2021              Requested by: No referring provider defined for this encounter. PCP: Pcp, No  Chief Complaint  Patient presents with   Right Foot - Routine Post Op    10/10/2021 right foot debridement       HPI: Patient is a 51 year old gentleman who presents in follow-up for right foot debridement status post dehiscence of great toe amputation.  Patient states the wound VAC stopped working.  Assessment & Plan: Visit Diagnoses:  1. Amputated great toe of right foot ([place])   2. Wound dehiscence, surgical, initial encounter     Plan: Patient will start Dial soap cleansing daily dressing changes continued strict nonweightbearing at follow-up in a week anticipate starting Silvadene dressing changes.  Follow-Up Instructions: Return in about 1 week (around 10/20/2021).   Ortho Exam  Patient is alert, oriented, no adenopathy, well-dressed, normal affect, normal respiratory effort. Examination there is maceration around the wound with a small amount of serosanguineous drainage the sutures are intact the Kerecis tissue graft is intact.  Imaging: No results found. No images are attached to the encounter.  Labs: Lab Results  Component Value Date   HGBA1C 10.1 (H) 09/14/2021   HGBA1C 6.3 (H) 01/27/2021   HGBA1C 7.4 (H) 08/08/2020   ESRSEDRATE 3 08/07/2020   REPTSTATUS 09/19/2021 FINAL 09/14/2021   CULT  09/14/2021    NO GROWTH 5 DAYS Performed at [facility], [place] [address] [place], [place] [address]      Lab Results  Component Value Date   ALBUMIN 2.7 (L) 09/15/2021   ALBUMIN 2.9 (L) 09/14/2021   ALBUMIN 3.6 07/02/2021    Lab Results  Component Value Date   MG 2.2 09/17/2021   MG 1.8 01/30/2021   MG 1.8 01/26/2021   No results found for: "VD25OH"  No results found for: "PREALBUMIN"    Latest Ref Rng & Units 09/19/2021    4:23  AM 09/18/2021    1:51 AM 09/17/2021    2:35 AM  CBC EXTENDED  WBC 4.0 - 10.5 K/uL 16.4  15.6  18.3   RBC 4.22 - 5.81 MIL/uL 4.31  4.55  4.79   Hemoglobin 13.0 - 17.0 g/dL 11.4  11.9  12.6   HCT 39.0 - 52.0 % 35.6  37.6  39.6   Platelets 150 - 400 K/uL 290  327  309      There is no height or weight on file to calculate BMI.  Orders:  No orders of the defined types were placed in this encounter.  No orders of the defined types were placed in this encounter.    Procedures: No procedures performed  Clinical Data: No additional findings.  ROS:  All other systems negative, except as noted in the HPI. Review of Systems  Objective: Vital Signs: There were no vitals taken for this visit.  Specialty Comments:  No specialty comments available.  PMFS History: Patient Active Problem List   Diagnosis Date Noted   Osteomyelitis of great toe of right foot ([place])    Cutaneous abscess of right foot    Cellulitis of right foot 09/15/2021   Leg pain, medial, right 09/14/2021   Permanent atrial fibrillation ([place]) 01/27/2021   SIRS (systemic inflammatory response syndrome) ([place])  01/27/2021   HTN (hypertension) 01/27/2021   Hypokalemia 03/55/9741   Acute systolic CHF (congestive heart failure) ([place]) 01/27/2021   New onset of congestive heart failure ([place]) 01/26/2021   Acute CVA (cerebrovascular accident) ([place]) 08/07/2020   Acute upper respiratory infection 11/27/2014   Numbness of finger 09/19/2011   Hilar adenopathy    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[place]) 63/84/5364   Uncontrolled type 2 diabetes mellitus with microalbuminuria 04/23/2010   Renal insufficiency 03/21/2008   VISUAL ACUITY, DECREASED, LEFT EYE 03/19/2008   BELL'S PALSY, LEFT 01/30/2008   Morbid obesity ([place]) 01/20/2008   Malignant hypertension with heart failure and stage 3 chronic kidney disease ([place]) 01/20/2008   Past Medical History:  Diagnosis Date   CKD (chronic kidney disease) stage 3, GFR 30-59 ml/min (HCC)    baseline Cr  1.5-1.7   Decreased visual acuity    Left eye, resolved - hypertensive retinopathy   Diabetes mellitus 2012   Type II   Hilar adenopathy    on CT scan 02/2010, on rpt scan stable/improved.   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[place])    echo 2011 with nonischemic hypertensive cardiomyopathy   Systolic murmur    Vitamin D deficiency     Family History  Problem Relation Age of Onset   Hypertension Mother    Hypertension Father    Diabetes Father    Hypertension Brother    Diabetes Sister    Coronary artery disease Neg Hx    Stroke Neg Hx    Cancer Neg Hx    Kidney disease Paternal Grandmother        ESRD    Past Surgical History:  Procedure Laterality Date   AMPUTATION Right 09/17/2021   Procedure: First ray amputation;  Surgeon: Newt Minion, MD;  Location: [place];  Service: Orthopedics;  Laterality: Right;   APPLICATION OF WOUND VAC Right 10/10/2021   Procedure: APPLICATION OF WOUND VAC;  Surgeon: Newt Minion, MD;  Location: [place];  Service: Orthopedics;  Laterality: Right;   CARDIOVERSION N/A 01/31/2021   Procedure: CARDIOVERSION;  Surgeon: Jolaine Artist, MD;  Location: [place];  Service: Cardiovascular;  Laterality: N/A;   CARDIOVERSION N/A 07/04/2021   Procedure: CARDIOVERSION;  Surgeon: Jolaine Artist, MD;  Location: [facility] ENDOSCOPY;  Service: Cardiovascular;  Laterality: N/A;   hospitalization  11/2008   malignant HTN, nl SPEP/UPEP, neg ANCA panel, nl C3/4, neg anti GBM Ab, neg Hep A/B/C, nl renal US, nl PTH, neg HIV   I & D EXTREMITY Right 10/10/2021   Procedure: RIGHT FOOT DEBRIDEMENT;  Surgeon: Newt Minion, MD;  Location: [place];  Service: Orthopedics;  Laterality: Right;   RIGHT/LEFT HEART CATH AND CORONARY ANGIOGRAPHY  N/A 01/29/2021   Procedure: RIGHT/LEFT HEART CATH AND CORONARY ANGIOGRAPHY;  Surgeon: Jolaine Artist, MD;  Location: [place] CV LAB;  Service: Cardiovascular;  Laterality: N/A;   TEE WITHOUT CARDIOVERSION N/A 01/31/2021   Procedure: TRANSESOPHAGEAL ECHOCARDIOGRAM (TEE);  Surgeon: Jolaine Artist, MD;  Location: [place];  Service: Cardiovascular;  Laterality: N/A;   US ECHOCARDIOGRAPHY  11/2008   LVsys fxn EF 50%, mild MR, normal LV size, neg ANA, neg cryoglobulins   US ECHOCARDIOGRAPHY  2011   severe LVH, EF 40%, LA mildly dilated, PA pressure moderately increased   Social History   Occupational History   Occupation: Tax Forensic psychologist    Comment: [employer]  Tobacco Use   Smoking status: Former    Types: Cigars   Smokeless tobacco: Never   Tobacco comments:    monthly, when going out  Media planner   Vaping Use: Never used  Substance and Sexual Activity   Alcohol use: Yes    Comment: Occasional   Drug use: No   Sexual activity: Not on file

## 2021-10-13 NOTE — Telephone Encounter (Signed)
Called pt and will come in this afternoon for eval.

## 2021-10-15 ENCOUNTER — Encounter (HOSPITAL_COMMUNITY): Payer: Self-pay | Admitting: Orthopedic Surgery

## 2021-10-15 NOTE — Anesthesia Postprocedure Evaluation (Signed)
Anesthesia Post Note  Patient: Randy [name]  Procedure(s) Performed: RIGHT FOOT DEBRIDEMENT (Right: Foot) APPLICATION OF WOUND VAC (Right: Foot)     Patient location during evaluation: PACU Anesthesia Type: MAC and Regional Level of consciousness: awake and alert Pain management: pain level controlled Vital Signs Assessment: post-procedure vital signs reviewed and stable Respiratory status: spontaneous breathing, nonlabored ventilation, respiratory function stable and patient connected to nasal cannula oxygen Cardiovascular status: stable and blood pressure returned to baseline Postop Assessment: no apparent nausea or vomiting Anesthetic complications: no   No notable events documented.  Last Vitals:  Vitals:   10/10/21 1015 10/10/21 1030  BP: 108/76 108/76  Pulse: 63 62  Resp: 18 19  Temp: (!) 36.2 C   SpO2: 100% 99%    Last Pain:  Vitals:   10/10/21 1015  TempSrc:   PainSc: 0-No pain                 Shari Natt S

## 2021-10-29 ENCOUNTER — Encounter (HOSPITAL_COMMUNITY): Payer: 59

## 2021-10-30 ENCOUNTER — Ambulatory Visit (INDEPENDENT_AMBULATORY_CARE_PROVIDER_SITE_OTHER): Payer: 59 | Admitting: Orthopedic Surgery

## 2021-10-30 DIAGNOSIS — T8131XA Disruption of external operation (surgical) wound, not elsewhere classified, initial encounter: Secondary | ICD-10-CM

## 2021-11-04 ENCOUNTER — Other Ambulatory Visit (HOSPITAL_COMMUNITY): Payer: Self-pay | Admitting: Adult Health

## 2021-11-04 ENCOUNTER — Telehealth: Payer: Self-pay | Admitting: Family

## 2021-11-04 NOTE — Telephone Encounter (Signed)
Pt called asking for an extended work from home note. Pt need for an additional 3 wks. Please send to pt email. Pt is e mail [email or web address]. Pt phone is [phone].

## 2021-11-05 NOTE — Telephone Encounter (Signed)
Emailed letter to pt

## 2021-11-10 ENCOUNTER — Other Ambulatory Visit (HOSPITAL_COMMUNITY): Payer: Self-pay

## 2021-11-20 ENCOUNTER — Telehealth (HOSPITAL_COMMUNITY): Payer: Self-pay

## 2021-11-20 NOTE — Telephone Encounter (Signed)
Called and left patient a message to confirm/remind patient of their appointment at [facility] on 11/21/21.

## 2021-11-21 ENCOUNTER — Encounter (HOSPITAL_COMMUNITY): Payer: Self-pay

## 2021-11-21 ENCOUNTER — Ambulatory Visit (INDEPENDENT_AMBULATORY_CARE_PROVIDER_SITE_OTHER): Payer: 59 | Admitting: Family

## 2021-11-21 ENCOUNTER — Encounter: Payer: Self-pay | Admitting: Orthopedic Surgery

## 2021-11-21 ENCOUNTER — Ambulatory Visit (HOSPITAL_COMMUNITY)
Admission: RE | Admit: 2021-11-21 | Discharge: 2021-11-21 | Disposition: A | Discharge status: Home / Self Care | Payer: 59 | Source: Ambulatory Visit | Attending: Family Medicine | Admitting: Family Medicine

## 2021-11-21 ENCOUNTER — Encounter: Payer: Self-pay | Admitting: Family

## 2021-11-21 VITALS — BP 160/100 | HR 70 | Ht 72.0 in | Wt 362.4 lb

## 2021-11-21 DIAGNOSIS — I2510 Atherosclerotic heart disease of native coronary artery without angina pectoris: Secondary | ICD-10-CM | POA: Diagnosis not present

## 2021-11-21 DIAGNOSIS — E1122 Type 2 diabetes mellitus with diabetic chronic kidney disease: Secondary | ICD-10-CM | POA: Insufficient documentation

## 2021-11-21 DIAGNOSIS — Z833 Family history of diabetes mellitus: Secondary | ICD-10-CM | POA: Insufficient documentation

## 2021-11-21 DIAGNOSIS — M79673 Pain in unspecified foot: Secondary | ICD-10-CM | POA: Insufficient documentation

## 2021-11-21 DIAGNOSIS — I130 Hypertensive heart and chronic kidney disease with heart failure and stage 1 through stage 4 chronic kidney disease, or unspecified chronic kidney disease: Secondary | ICD-10-CM | POA: Insufficient documentation

## 2021-11-21 DIAGNOSIS — L02611 Cutaneous abscess of right foot: Secondary | ICD-10-CM

## 2021-11-21 DIAGNOSIS — Z6841 Body Mass Index (BMI) 40.0 and over, adult: Secondary | ICD-10-CM | POA: Diagnosis not present

## 2021-11-21 DIAGNOSIS — Z89411 Acquired absence of right great toe: Secondary | ICD-10-CM | POA: Diagnosis not present

## 2021-11-21 DIAGNOSIS — Z7901 Long term (current) use of anticoagulants: Secondary | ICD-10-CM | POA: Insufficient documentation

## 2021-11-21 DIAGNOSIS — E785 Hyperlipidemia, unspecified: Secondary | ICD-10-CM | POA: Diagnosis not present

## 2021-11-21 DIAGNOSIS — Z8673 Personal history of transient ischemic attack (TIA), and cerebral infarction without residual deficits: Secondary | ICD-10-CM | POA: Insufficient documentation

## 2021-11-21 DIAGNOSIS — I480 Paroxysmal atrial fibrillation: Secondary | ICD-10-CM | POA: Diagnosis not present

## 2021-11-21 DIAGNOSIS — S98111A Complete traumatic amputation of right great toe, initial encounter: Secondary | ICD-10-CM

## 2021-11-21 DIAGNOSIS — G4733 Obstructive sleep apnea (adult) (pediatric): Secondary | ICD-10-CM | POA: Diagnosis not present

## 2021-11-21 DIAGNOSIS — Z87891 Personal history of nicotine dependence: Secondary | ICD-10-CM | POA: Diagnosis not present

## 2021-11-21 DIAGNOSIS — Z8249 Family history of ischemic heart disease and other diseases of the circulatory system: Secondary | ICD-10-CM | POA: Diagnosis not present

## 2021-11-21 DIAGNOSIS — M869 Osteomyelitis, unspecified: Secondary | ICD-10-CM

## 2021-11-21 DIAGNOSIS — I5022 Chronic systolic (congestive) heart failure: Secondary | ICD-10-CM | POA: Insufficient documentation

## 2021-11-21 DIAGNOSIS — I10 Essential (primary) hypertension: Secondary | ICD-10-CM | POA: Diagnosis not present

## 2021-11-21 DIAGNOSIS — Z79899 Other long term (current) drug therapy: Secondary | ICD-10-CM | POA: Insufficient documentation

## 2021-11-21 DIAGNOSIS — Z9989 Dependence on other enabling machines and devices: Secondary | ICD-10-CM

## 2021-11-21 DIAGNOSIS — N1832 Chronic kidney disease, stage 3b: Secondary | ICD-10-CM | POA: Diagnosis not present

## 2021-11-21 DIAGNOSIS — N183 Chronic kidney disease, stage 3 unspecified: Secondary | ICD-10-CM

## 2021-11-21 DIAGNOSIS — E669 Obesity, unspecified: Secondary | ICD-10-CM | POA: Diagnosis not present

## 2021-11-21 DIAGNOSIS — I428 Other cardiomyopathies: Secondary | ICD-10-CM | POA: Diagnosis not present

## 2021-11-21 LAB — CBC
HCT: 36.4 % — ABNORMAL LOW (ref 39.0–52.0)
Hemoglobin: 11.1 g/dL — ABNORMAL LOW (ref 13.0–17.0)
MCH: 24.9 pg — ABNORMAL LOW (ref 26.0–34.0)
MCHC: 30.5 g/dL (ref 30.0–36.0)
MCV: 81.6 fL (ref 80.0–100.0)
Platelets: 303 10*3/uL (ref 150–400)
RBC: 4.46 MIL/uL (ref 4.22–5.81)
RDW: 16.2 % — ABNORMAL HIGH (ref 11.5–15.5)
WBC: 8.4 10*3/uL (ref 4.0–10.5)
nRBC: 0 % (ref 0.0–0.2)

## 2021-11-21 LAB — COMPREHENSIVE METABOLIC PANEL
ALT: 20 U/L (ref 0–44)
AST: 16 U/L (ref 15–41)
Albumin: 3.2 g/dL — ABNORMAL LOW (ref 3.5–5.0)
Alkaline Phosphatase: 57 U/L (ref 38–126)
Anion gap: 9 (ref 5–15)
BUN: 30 mg/dL — ABNORMAL HIGH (ref 6–20)
CO2: 24 mmol/L (ref 22–32)
Calcium: 9 mg/dL (ref 8.9–10.3)
Chloride: 106 mmol/L (ref 98–111)
Creatinine, Ser: 2.04 mg/dL — ABNORMAL HIGH (ref 0.61–1.24)
GFR, Estimated: 39 mL/min — ABNORMAL LOW (ref >60)
Glucose, Bld: 181 mg/dL — ABNORMAL HIGH (ref 70–99)
Potassium: 3.9 mmol/L (ref 3.5–5.1)
Sodium: 139 mmol/L (ref 135–145)
Total Bilirubin: 0.2 mg/dL — ABNORMAL LOW (ref 0.3–1.2)
Total Protein: 7.2 g/dL (ref 6.5–8.1)

## 2021-11-21 LAB — TSH: TSH: 0.103 u[IU]/mL — ABNORMAL LOW (ref 0.350–4.500)

## 2021-11-21 MED ORDER — AMIODARONE HCL 200 MG PO TABS: 200.0000 mg | ORAL_TABLET | Freq: Every day | ORAL | 3 refills | Status: DC

## 2021-11-21 NOTE — Patient Instructions (Signed)
EKG done today.  Labs done today. We will contact you only if your labs are abnormal.  DECREASE Amiodarone to 200mg  (1 tablet) by mouth daily.   No other medication changes were made. Please continue all current medications as prescribed.  Your physician has requested that you regularly monitor and record your blood pressure readings at home. Please use the same machine at the same time of day to check your readings and record them to bring to your follow-up visit.  You have [facility]. Both offices will contact you to schedule an appointment.  You were a provided a list of Primary Care Physicians. Please pick one and schedule a new patient appointment with them.  Your physician recommends that you schedule a follow-up appointment in: 3-4 months with Dr. Washington. Please contact our office in October to schedule a November. appointment.   If you have any questions or concerns before your next appointment please send December a message through [place] or call our office at [phone].    TO LEAVE A MESSAGE FOR THE NURSE SELECT OPTION 2, PLEASE LEAVE A MESSAGE INCLUDING: YOUR NAME DATE OF BIRTH CALL BACK NUMBER REASON FOR CALL**this is important as we prioritize the call backs  YOU WILL RECEIVE A CALL BACK THE SAME DAY AS LONG AS YOU CALL BEFORE 4:00 PM   Do the following things EVERYDAY: Weigh yourself in the morning before breakfast. Write it down and keep it in a log. Take your medicines as prescribed Eat low salt foods--Limit salt (sodium) to 2000 mg per day.  Stay as active as you can everyday Limit all fluids for the day to less than 2 liters   At the [facility], you and your health needs are our priority. As part of our continuing mission to provide you with exceptional heart care, we have created designated Provider Care Teams. These Care Teams include your primary Cardiologist (physician) and Advanced Practice  Providers (APPs- Physician Assistants and Nurse Practitioners) who all work together to provide you with the care you need, when you need it.   You may see any of the following providers on your designated Care Team at your next follow up: Dr [phone] Dr Arvilla Meres, NP Carron Curie, Robbie Lis [place], PharmD   Please be sure to bring in all your medications bottles to every appointment.

## 2021-11-21 NOTE — Progress Notes (Signed)
Post-Op Visit Note   Patient: Randy [name]           Date of Birth: 1970-07-09           MRN: [id number] Visit Date: 11/21/2021 PCP: Pcp, No  Chief Complaint:  Chief Complaint  Patient presents with  . Right Foot - Routine Post Op    10/10/21 right foot deb    HPI:  HPI  Ortho Exam ***  Visit Diagnoses: No diagnosis found.  Plan: ***  Follow-Up Instructions: No follow-ups on file.   Imaging: No results found.  Orders:  No orders of the defined types were placed in this encounter.  No orders of the defined types were placed in this encounter.    PMFS History: Patient Active Problem List   Diagnosis Date Noted  . Osteomyelitis of great toe of right foot ([place])   . Cutaneous abscess of right foot   . Cellulitis of right foot 09/15/2021  . Leg pain, medial, right 09/14/2021  . Permanent atrial fibrillation ([place]) 01/27/2021  . SIRS (systemic inflammatory response syndrome) ([place]) 01/27/2021  . HTN (hypertension) 01/27/2021  . Hypokalemia 01/27/2021  . Acute systolic CHF (congestive heart failure) ([place]) 01/27/2021  . New onset of congestive heart failure ([place]) 01/26/2021  . Acute CVA (cerebrovascular accident) ([place]) 08/07/2020  . Acute upper respiratory infection 11/27/2014  . Numbness of finger 09/19/2011  . Hilar adenopathy   . 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[place]) 02/54/2706  . Uncontrolled type 2 diabetes mellitus with microalbuminuria 04/23/2010  . Renal insufficiency 03/21/2008  . VISUAL ACUITY, DECREASED, LEFT EYE 03/19/2008  . BELL'S PALSY, LEFT 01/30/2008  . Morbid obesity ([place]) 01/20/2008  . 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 CKD (chronic kidney disease) stage 3, GFR 30-59 ml/min (HCC)    baseline Cr 1.5-1.7  . Decreased visual acuity    Left eye, resolved - hypertensive retinopathy  . Diabetes mellitus 2012   Type II  . Hilar adenopathy    on CT scan 02/2010,  on rpt scan stable/improved.  Marland Kitchen History of Bell's palsy 12/2007   history, Left  . History of headache   . HTN (hypertension), malignant    previously on BC and goody powders for HA  . Internal derangement of knee 12/2007   Left  . Microalbuminuria   . Morbid obesity ([place])   . Stroke ([place])   . 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[place])    echo 2011 with nonischemic hypertensive cardiomyopathy  . Systolic murmur   . Vitamin D deficiency     Family History  Problem Relation Age of Onset  . Hypertension Mother   . Hypertension Father   . Diabetes Father   . Hypertension Brother   . Diabetes Sister   . Coronary artery disease Neg Hx   . Stroke Neg Hx   . Cancer Neg Hx   . Kidney disease Paternal Grandmother        ESRD    Past Surgical History:  Procedure Laterality Date  . AMPUTATION Right 09/17/2021   Procedure: First ray amputation;  Surgeon: Newt Minion, MD;  Location: [place];  Service: Orthopedics;  Laterality: Right;  . APPLICATION OF WOUND VAC Right 10/10/2021   Procedure: APPLICATION OF WOUND VAC;  Surgeon: Newt Minion, MD;  Location: [facility];  Service: Orthopedics;  Laterality: Right;  . CARDIOVERSION N/A 01/31/2021   Procedure: CARDIOVERSION;  Surgeon: Jolaine Artist, MD;  Location: [facility] ENDOSCOPY;  Service: Cardiovascular;  Laterality: N/A;  . CARDIOVERSION N/A 07/04/2021   Procedure: CARDIOVERSION;  Surgeon: Jolaine Artist, MD;  Location: [facility] ENDOSCOPY;  Service: Cardiovascular;  Laterality: N/A;  . hospitalization  11/2008   malignant HTN, nl SPEP/UPEP, neg ANCA panel, nl C3/4, neg anti GBM Ab, neg Hep A/B/C, nl renal US, nl PTH, neg HIV  . I & D EXTREMITY Right 10/10/2021   Procedure: RIGHT FOOT DEBRIDEMENT;  Surgeon: Newt Minion, MD;  Location: [place];  Service: Orthopedics;  Laterality: Right;  . RIGHT/LEFT HEART CATH AND CORONARY ANGIOGRAPHY N/A 01/29/2021   Procedure: RIGHT/LEFT HEART CATH AND CORONARY ANGIOGRAPHY;  Surgeon: Jolaine Artist, MD;  Location: [place] CV  LAB;  Service: Cardiovascular;  Laterality: N/A;  . TEE WITHOUT CARDIOVERSION N/A 01/31/2021   Procedure: TRANSESOPHAGEAL ECHOCARDIOGRAM (TEE);  Surgeon: Jolaine Artist, MD;  Location: [facility] ENDOSCOPY;  Service: Cardiovascular;  Laterality: N/A;  . US ECHOCARDIOGRAPHY  11/2008   LVsys fxn EF 50%, mild MR, normal LV size, neg ANA, neg cryoglobulins  . US ECHOCARDIOGRAPHY  2011   severe LVH, EF 40%, LA mildly dilated, PA pressure moderately increased   Social History   Occupational History  . Occupation: Tax Forensic psychologist    Comment: [employer]  . Smoking status: Former    Types: Cigars  . Smokeless tobacco: Never  . Tobacco comments:    monthly, when going out  Vaping Use  . Vaping Use: Never used  Substance and Sexual Activity  . Alcohol use: Yes    Comment: Occasional  . Drug use: No  . Sexual activity: Not on file

## 2021-11-21 NOTE — Progress Notes (Signed)
Office Visit Note   Patient: Randy [name]           Date of Birth: Sep 21, 1970           MRN: [id number] Visit Date: 10/30/2021              Requested by: No referring provider defined for this encounter. PCP: Pcp, No  Chief Complaint  Patient presents with   Right Foot - Routine Post Op    10/10/2021 right foot debridement       HPI: Patient is a 51 year old gentleman status post right foot debridement with Kerecis tissue graft.  Assessment & Plan: Visit Diagnoses:  1. Wound dehiscence, surgical, initial encounter     Plan: Continue with dressing changes nonweightbearing kneeling scooter.  Follow-Up Instructions: Return in about 2 weeks (around 11/13/2021).   Ortho Exam  Patient is alert, oriented, no adenopathy, well-dressed, normal affect, normal respiratory effort. Examination patient has a dopplerable triphasic dorsalis pedis and posterior tibial pulse.  There is swelling and a moderate amount of drainage.  We will continue with Dial soap cleansing and dressing changes.  Imaging: No results found. No images are attached to the encounter.  Labs: Lab Results  Component Value Date   HGBA1C 10.1 (H) 09/14/2021   HGBA1C 6.3 (H) 01/27/2021   HGBA1C 7.4 (H) 08/08/2020   ESRSEDRATE 3 08/07/2020   REPTSTATUS 09/19/2021 FINAL 09/14/2021   CULT  09/14/2021    NO GROWTH 5 DAYS Performed at [facility], [place] [address] [place], [place] [address]      Lab Results  Component Value Date   ALBUMIN 2.7 (L) 09/15/2021   ALBUMIN 2.9 (L) 09/14/2021   ALBUMIN 3.6 07/02/2021    Lab Results  Component Value Date   MG 2.2 09/17/2021   MG 1.8 01/30/2021   MG 1.8 01/26/2021   No results found for: "VD25OH"  No results found for: "PREALBUMIN"    Latest Ref Rng & Units 09/19/2021    4:23 AM 09/18/2021    1:51 AM 09/17/2021    2:35 AM  CBC EXTENDED  WBC 4.0 - 10.5 K/uL 16.4  15.6  18.3   RBC 4.22 - 5.81 MIL/uL 4.31  4.55  4.79   Hemoglobin 13.0 - 17.0 g/dL  11.4  11.9  12.6   HCT 39.0 - 52.0 % 35.6  37.6  39.6   Platelets 150 - 400 K/uL 290  327  309      There is no height or weight on file to calculate BMI.  Orders:  No orders of the defined types were placed in this encounter.  No orders of the defined types were placed in this encounter.    Procedures: No procedures performed  Clinical Data: No additional findings.  ROS:  All other systems negative, except as noted in the HPI. Review of Systems  Objective: Vital Signs: There were no vitals taken for this visit.  Specialty Comments:  No specialty comments available.  PMFS History: Patient Active Problem List   Diagnosis Date Noted   Osteomyelitis of great toe of right foot ([place])    Cutaneous abscess of right foot    Cellulitis of right foot 09/15/2021   Leg pain, medial, right 09/14/2021   Permanent atrial fibrillation ([place]) 01/27/2021   SIRS (systemic inflammatory response syndrome) ([place]) 01/27/2021   HTN (hypertension) 01/27/2021   Hypokalemia 28/36/6294   Acute systolic CHF (congestive heart failure) ([place]) 01/27/2021   New onset of congestive heart failure ([place]) 01/26/2021  Acute CVA (cerebrovascular accident) ([place]) 08/07/2020   Acute upper respiratory infection 11/27/2014   Numbness of finger 09/19/2011   Hilar adenopathy    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[place]) 69/67/8938   Uncontrolled type 2 diabetes mellitus with microalbuminuria 04/23/2010   Renal insufficiency 03/21/2008   VISUAL ACUITY, DECREASED, LEFT EYE 03/19/2008   BELL'S PALSY, LEFT 01/30/2008   Morbid obesity ([place]) 01/20/2008   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   History of Bell's palsy 12/2007   history, Left   History of headache    HTN (hypertension), malignant    previously on BC and goody powders for HA   Internal derangement of knee 12/2007   Left   Microalbuminuria    Morbid obesity ([place])    Stroke ([place])    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[place])    echo 2011 with nonischemic hypertensive cardiomyopathy   Systolic murmur    Vitamin D deficiency     Family History  Problem Relation Age of Onset   Hypertension Mother    Hypertension Father    Diabetes Father    Hypertension Brother    Diabetes Sister    Coronary artery disease Neg Hx    Stroke Neg Hx    Cancer Neg Hx    Kidney disease Paternal Grandmother        ESRD    Past Surgical History:  Procedure Laterality Date   AMPUTATION Right 09/17/2021   Procedure: First ray amputation;  Surgeon: Newt Minion, MD;  Location: [place];  Service: Orthopedics;  Laterality: Right;   APPLICATION OF WOUND VAC Right 10/10/2021   Procedure: APPLICATION OF WOUND VAC;  Surgeon: Newt Minion, MD;  Location: [place];  Service: Orthopedics;  Laterality: Right;   CARDIOVERSION N/A 01/31/2021   Procedure: CARDIOVERSION;  Surgeon: Jolaine Artist, MD;  Location: [place];  Service: Cardiovascular;  Laterality: N/A;   CARDIOVERSION N/A 07/04/2021   Procedure: CARDIOVERSION;  Surgeon: Jolaine Artist, MD;  Location: [facility] ENDOSCOPY;  Service: Cardiovascular;  Laterality: N/A;   hospitalization  11/2008   malignant HTN, nl SPEP/UPEP, neg ANCA panel, nl C3/4, neg anti GBM Ab, neg Hep A/B/C, nl renal US, nl PTH, neg HIV   I & D EXTREMITY Right 10/10/2021   Procedure: RIGHT FOOT DEBRIDEMENT;  Surgeon: Newt Minion, MD;  Location: [place];  Service: Orthopedics;  Laterality: Right;   RIGHT/LEFT HEART CATH AND CORONARY ANGIOGRAPHY N/A 01/29/2021   Procedure: RIGHT/LEFT HEART CATH AND CORONARY ANGIOGRAPHY;  Surgeon: Jolaine Artist, MD;  Location: [place] CV LAB;  Service: Cardiovascular;   Laterality: N/A;   TEE WITHOUT CARDIOVERSION N/A 01/31/2021   Procedure: TRANSESOPHAGEAL ECHOCARDIOGRAM (TEE);  Surgeon: Jolaine Artist, MD;  Location: [facility] ENDOSCOPY;  Service: Cardiovascular;  Laterality: N/A;   US ECHOCARDIOGRAPHY  11/2008   LVsys fxn EF 50%, mild MR, normal LV size, neg ANA, neg cryoglobulins   US ECHOCARDIOGRAPHY  2011   severe LVH, EF 40%, LA mildly dilated, PA pressure moderately increased   Social History   Occupational History   Occupation: Tax Forensic psychologist    Comment:  [employer]  Tobacco Use   Smoking status: Former    Types: Cigars   Smokeless tobacco: Never   Tobacco comments:    monthly, when going out  Vaping Use   Vaping Use: Never used  Substance and Sexual Activity   Alcohol use: Yes    Comment: Occasional   Drug use: No   Sexual activity: Not on file

## 2021-11-21 NOTE — Progress Notes (Signed)
Advanced Heart Failure Clinic Note  PCP: [facility] Nephrologist:  HF  Cardiologist: Dr. Haroldine Laws  HPI: [name] is a 51 year old with a history of HFrEF, permanent A fib, HTN, hypertensive retinopathy, CKD Stage IIIa, hyperlipidemia, DMII, Bells palsy, CVA, and OSA.    Admitted in 12/02/2019 with newly diagnosed A fib + ischemic CVA. He did not meet criteria for IV tPA. MRI showed large acute focal infarct, left frontal lobe predominantly ACA distribution. CT angiogram of the head and neck performed revealing no significant carotid, vertebral or intracranial stenosis. Echocardiogram obtained with bubble study showing no cardiac etiology for stroke. LVEF was 40 to 45%.Placed on Toprol XL. Creatinine on 12/04/20 1.7.    Followed at [facility] for cardiology services. Saw Norberto Sorenson on 12/12/2019. Set up for sleep study. Using CPAP 3 times a week.   Admitted 10/22 with A/C HFrEF and A fib RVR.  Echo showed EF down to 30-35% with speckled appears concerning for amyloid.Diuresed with IV lasix and placed on Amio drip. Once diuresed had TEE-DC-CV with restoration of NSR. Had RHC/LHC with min obs CAD, elevated filling pressures and normal cardiac output.  Diuresed 40 pounds. Discharge weight 367 pound.  Echo 3/23 EF 55% severe LVH.  Admitted 5/23 with osteomyelitis R great toe and cellulitis RLE. Placed on IV abx. S/p R first ray amputation + placement wound VAC. AHF consulted at patient's request, lasix stopped. Discharged home, weight 363 lbs. Wound later dehisced and underwent surgical debridement 6/23.   Today he returns for post hospital HF follow up. Overall feeling fine. Main issue is foot pain and mobility issues after his amputation. He is not SOB using his push scooter if he takes his time. Denies palpitations, abnormal bleeding, CP, dizziness, edema, or PND/Orthopnea. Appetite ok. No fever or chills. Weight at home 350 pounds. Taking all medications. BP at home  120/65s.   Cardiac Testing - Echo (3/23): EF 55%, severe LVH  - Echo (10/22): EF 30-35% specked appearance concerning for amyloid. RV normal. LA severely dilated. Severe LVH.   - Echo (8/21): EF 40-45% and severe LVH   - R/LHC (10/22)  Ao = 163/108 (134) LV = 136/25 RA =  15 RV = 51/13 PA = 57/19 (39) PCW = 28 Fick cardiac output/index = 9.2/3.0 PVR = 1.2 WU Ao sat = 94% PA sat = 65%, 65%    1. Mild non-obstructive CAD 2. NICM (likely hypertensive) with EF 35% by echo 3. Elevated filling pressures with normal CO 4. Severe systemic HTN  ROS: All systems negative except as listed in HPI, PMH and Problem List.  SH:  Social History   Socioeconomic History   Marital status: Married    Spouse name: Randy [name]   Number of children: 3   Years of education: Not on file   Highest education level: Bachelor's degree (e.g., BA, AB, BS)  Occupational History   Occupation: Tax Forensic psychologist    Comment: [employer]  Tobacco Use   Smoking status: Former    Types: Cigars   Smokeless tobacco: Never   Tobacco comments:    monthly, when going out  Vaping Use   Vaping Use: Never used  Substance and Sexual Activity   Alcohol use: Yes    Comment: Occasional   Drug use: No   Sexual activity: Not on file  Other Topics Concern   Not on file  Social History Narrative   Newly married, 2012, 1 daughter, 1 son   No injectable  steroid cycles   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, 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)   Regular exercise-yes   Social Determinants of Health   Financial Resource Strain: Low Risk  (01/27/2021)   Overall Financial Resource Strain (CARDIA)    Difficulty of Paying Living Expenses: Not hard at all  Food Insecurity: No Food Insecurity (02/11/2021)   Hunger Vital Sign    Worried About Running Out of Food in the Last Year: Never true    Ran Out of Food in the Last Year: Never true  Transportation Needs: No Transportation Needs (01/27/2021)    PRAPARE - Administrator, Civil Service (Medical): No    Lack of Transportation (Non-Medical): No  Physical Activity: Not on file  Stress: Not on file  Social Connections: Not on file  Intimate Partner Violence: Not on file   FH:  Family History  Problem Relation Age of Onset   Hypertension Mother    Hypertension Father    Diabetes Father    Hypertension Brother    Diabetes Sister    Coronary artery disease Neg Hx    Stroke Neg Hx    Cancer Neg Hx    Kidney disease Paternal Grandmother        ESRD   Past Medical History:  Diagnosis Date   CKD (chronic kidney disease) stage 3, GFR 30-59 ml/min (HCC)    baseline Cr 1.5-1.7   Decreased visual acuity    Left eye, resolved - hypertensive retinopathy   Diabetes mellitus 2012   Type II   Hilar adenopathy    on CT scan 02/2010, on rpt scan stable/improved.   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   Systolic murmur    Vitamin D deficiency    Current Outpatient Medications  Medication Sig Dispense Refill   acetaminophen (TYLENOL) 500 MG tablet Take 500 mg by mouth every 6 (six) hours as needed (pain.).     amiodarone (PACERONE) 200 MG tablet Take 1 tablet (200 mg total) by mouth 2 (two) times daily. 180 tablet 1   apixaban (ELIQUIS) 5 MG TABS tablet Take 1 tablet (5 mg total) by mouth 2 (two) times daily. 180 tablet 1   ascorbic acid (VITAMIN C) 1000 MG tablet Take 1 tablet (1,000 mg total) by mouth daily. 30 tablet 0   atorvastatin (LIPITOR) 20 MG tablet Take 1 tablet (20 mg total) by mouth at bedtime. 90 tablet 1   docusate sodium (COLACE) 100 MG capsule Take 1 capsule (100 mg total) by mouth daily. 30 capsule 0    doxycycline (VIBRA-TABS) 100 MG tablet Take 1 tablet (100 mg total) by mouth 2 (two) times daily. 28 tablet 0   empagliflozin (JARDIANCE) 10 MG TABS tablet Take 1 tablet (10 mg total) by mouth daily. 90 tablet 1   ferrous gluconate (FERGON) 324 MG tablet Take 1 tablet (324 mg total) by mouth daily with breakfast. 30 tablet 0   isosorbide-hydrALAZINE (BIDIL) 20-37.5 MG tablet Take 2 tablets by mouth 3 (three) times daily.     metFORMIN (GLUCOPHAGE-XR) 500 MG 24 hr tablet Take 1 tablet (500 mg total)  by mouth daily with breakfast. 30 tablet 0   metoprolol succinate (TOPROL-XL) 100 MG 24 hr tablet TAKE 1 TABLET BY MOUTH ONCE DAILY (TAKE  WITH  OR  IMMEDIATELY  FOLLOWING  A  MEAL) 90 tablet 1   nutrition supplement, JUVEN, (JUVEN) PACK Take 1 packet by mouth 2 (two) times daily between meals. 30 each 0   oxyCODONE-acetaminophen (PERCOCET/ROXICET) 5-325 MG tablet Take 1 tablet by mouth every 4 (four) hours as needed. 30 tablet 0   pantoprazole (PROTONIX) 40 MG tablet Take 1 tablet (40 mg total) by mouth daily. 15 tablet 0   sacubitril-valsartan (ENTRESTO) 97-103 MG Take 1 tablet by mouth 2 (two) times daily. 180 tablet 1   spironolactone (ALDACTONE) 25 MG tablet Take 1 tablet (25 mg total) by mouth daily. 90 tablet 1   Zinc Sulfate 220 (50 Zn) MG TABS Take 1 tablet (220 mg total) by mouth daily. 30 tablet 0   potassium chloride SA (KLOR-CON M20) 20 MEQ tablet TAKE 2 TABLETS BY MOUTH ONLY WHEN TAKING METOLAZONE (Patient not taking: Reported on 11/21/2021) 30 tablet 0   No current facility-administered medications for this encounter.   BP (!) 160/100   Pulse 70   Ht 6' (1.829 m)   Wt (!) 164.4 kg (362 lb 6.4 oz)   SpO2 98%   BMI 49.15 kg/m   Wt Readings from Last 3 Encounters:  11/21/21 (!) 164.4 kg (362 lb 6.4 oz)  10/10/21 (!) 167.8 kg (370 lb)  10/06/21 (!) 165.6 kg (365 lb 1.3 oz)   PHYSICAL EXAM: General:  NAD. No resp difficulty, arrived using scooter HEENT: Normal Neck: Supple. No JVD.  Carotids 2+ bilat; no bruits. No lymphadenopathy or thryomegaly appreciated. Cor: PMI nondisplaced. Regular rate & rhythm. No rubs, gallops or murmurs. Lungs: Clear Abdomen: Obese, soft, nontender, nondistended. No hepatosplenomegaly. No bruits or masses. Good bowel sounds. Extremities: No cyanosis, clubbing, rash, 1+ RLE pre-tibial edema; R foot in ortho shoe Neuro: Alert & oriented x 3, cranial nerves grossly intact. Moves all 4 extremities w/o difficulty. Affect pleasant.  ECG (personally reviewed): NSR 67 bpm, QTc 513 msec  ASSESSMENT & PLAN:   1. 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 LHC w/ mild nonobstructive CAD. Suspect HTN cardiomyopathy.  - Echo 01/31/21 EF 30-35% with severe LVH. Doubt amyloid with uncontrolled hypertension. - Echo 3/23 EF 55-60% (recovered EF) - NYHA I-II, physically limited by toe amputation and need to use scooter. - Elevate legs when at home. - Continue Entresto 97-103 mg bid. - Continue Jardiance 10 mg daily.  - Continue Bidil 2 tab tid.  - Continue Toprol XL 100 mg daily. - Continue spiro 25 mg daily. - Labs today.   2. PAF - S/P TEE/DC-CV (10/22) with restoration NSR  - Recurrent AF 3/23 s/p DCCV -.NSR on ECG today. - Decrease amiodarone to 200 mg daily. - Continue Eliquis 5 mg bid. - CBC today. Check amio labs.   3. Hypertension  - Elevated today, says his foot hurts and he was rushing to get here. - Well-controlled on home BP checks. - Advised checking BP at home and log. - Consider adding amlodipine.   4. CKD Stage IIIb  - Baseline SCr 1.7-1.9 - Refer to Nephrology. - Labs today.   5. DMII - Poorly controlled, A1c 10.1  - He is not on insulin. - Continue Jardiance. No GU symptoms. - Needs PCP/Endocrinology. Given list of PCPs today.   6. OSA - On CPAP.    7. CAD -  Mild nonobstructive on cath - No chest pain.  - c/w statin, LDL Goal < 70.  - Continue ? blocker. - No ASA w/ Eliquis.   8. S/p R great toe amputation -  s/p R LE Osteomyelitis/R lower leg cellulitis, required Wound VAC 5/23. - Followed by Dr. Sharol Given  9. Obesity - Body mass index is 49.15 kg/m. - Refer to pharmacy for semaglutide. Hopefully insurance coverage will not be a barrier. - Given a list of PCPs today to establish care.   Follow up in 3 months with Dr. Haroldine Laws.  [place], FNP-BC. 11/21/21

## 2021-11-24 ENCOUNTER — Telehealth: Payer: Self-pay | Admitting: Orthopedic Surgery

## 2021-11-24 NOTE — Telephone Encounter (Signed)
Ready for MD signature, will call once ready for p/u.

## 2021-11-24 NOTE — Telephone Encounter (Signed)
Pt called requesting a handicap placard. Pt states its hard for him to park far away after surgery. Please call pt when ready for pick up. Pt phone number is [phone].

## 2021-11-25 ENCOUNTER — Other Ambulatory Visit: Payer: Self-pay

## 2021-11-25 NOTE — Telephone Encounter (Signed)
Pt informed he will p/u form at front desk, also he is scheduled for f/u apt 12/05/21 w/ Denny Peon

## 2021-11-25 NOTE — Telephone Encounter (Signed)
Can you please call pt and make an appt for next week with Erin 12/05/2021? 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 Thanks!

## 2021-12-02 ENCOUNTER — Telehealth: Payer: Self-pay | Admitting: Orthopedic Surgery

## 2021-12-02 ENCOUNTER — Telehealth: Payer: Self-pay | Admitting: Pharmacist

## 2021-12-02 MED ORDER — MOUNJARO 2.5 MG/0.5ML ~~LOC~~ SOAJ: 2.5000 mg | SUBCUTANEOUS | 0 refills | Status: DC

## 2021-12-02 NOTE — Telephone Encounter (Signed)
Received medical records release form and $25.00 cash from patient. Forwarding to [employer] today  °

## 2021-12-02 NOTE — Telephone Encounter (Signed)
Pt referred to PharmD by CHF clinic for [facility] therapy. Pt has hx of obesity and T2DM with most recent A1c uncontrolled at 10.1% during May 2023 hospitalization, up from 6.3% in Oct 2022. Current DM meds include Jardiance 10mg  daily and metformin ER 500mg  daily. Does not look like he currently has a PCP, was previously following with Dr at [facility] but most recent visit was 11/2014. Most recent weight 362 lbs, has been as high as 415 lbs back in 2015.  Will submit prior authorization for [facility] and follow up with pt once insurance determination is made.

## 2021-12-02 NOTE — Telephone Encounter (Addendum)
Prior auth approved through 12/03/22. Called pt to discuss starting therapy, he is interested in trying [place]. Rx sent to pharmacy, scheduled PharmD appt for tomorrow. Confirmed copay is $0 at his pharmacy.

## 2021-12-03 ENCOUNTER — Ambulatory Visit (INDEPENDENT_AMBULATORY_CARE_PROVIDER_SITE_OTHER): Payer: 59 | Admitting: Pharmacist

## 2021-12-03 DIAGNOSIS — E118 Type 2 diabetes mellitus with unspecified complications: Secondary | ICD-10-CM | POA: Diagnosis not present

## 2021-12-03 NOTE — Progress Notes (Signed)
Patient ID: Randy [name]                 DOB: 09-16-1970                    MRN: [id number]     HPI: Randy [name] is a 51 y.o. male patient of Dr Gala Romney referred to pharmacy clinic by [name], NP to initiate weight loss therapy with GLP1-RA. PMH is significant for HFrEF, PAF, HTN, hypertensive retinopathy, CKD stage IIIa, DM2, HLD, CVA, OSA, Bells palsy, and obesity. Does not currently have a PCP. Most recent A1c up to 10.1% during May 2023 hospitalization. Most recent BMI 40, weight  366 lbs.  Pt presents today for follow up. Is looking for a PCP. Reports fasting glucose in the AM of around 70. No longer taking metformin, was only given a 30 day rx from ER a few months ago without refills. Also reports it caused GI upset. Still taking his Jardiance but nothing else for DM. Reports weight as high as 450 lbs in the past. Used to play [employer] football, diet was worse then - eating pizza, drinking beer etc. Has been focusing on dietary improvements, now mainly eats lean meats like [place] and chicken as well as protein shakes. Cut out soda from his diet too. Picked up [employer] from the pharmacy, didn't bring in dose today. Prior auth approved through 12/03/22. Copay $0 at his pharmacy without needing copay card.  Diet: Rejuvenate protein shakes, chicken and [place], no red meat. Drinks water. Cut out beer and soda.  Ex[facility] A bit less activity now since his foot is healing from recent surgery.  Family History: Mother with HTN, father with HTN and DM, brother with HTN, sister with DM.  Social History: Former smoker, occasional alcohol use.  Labs: Lab Results  Component Value Date   HGBA1C 10.1 (H) 09/14/2021    Wt Readings from Last 1 Encounters:  11/21/21 (!) 362 lb 6.4 oz (164.4 kg)    BP Readings from Last 1 Encounters:  11/21/21 (!) 160/100   Pulse Readings from Last 1 Encounters:  11/21/21 70       Component Value Date/Time   CHOL 91 08/08/2020  0447   TRIG 69 08/08/2020 0447   HDL 39 (L) 08/08/2020 0447   CHOLHDL 2.3 08/08/2020 0447   VLDL 14 08/08/2020 0447   LDLCALC 38 08/08/2020 0447   LDLDIRECT 79.0 11/28/2014 1705    Past Medical History:  Diagnosis Date   CKD (chronic kidney disease) stage 3, GFR 30-59 ml/min (HCC)    baseline Cr 1.5-1.7   Decreased visual acuity    Left eye, resolved - hypertensive retinopathy   Diabetes mellitus 2012   Type II   Hilar adenopathy    on CT scan 02/2010, on rpt scan stable/improved.   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     amiodarone (PACERONE) 200 MG tablet Take 1 tablet (200 mg total) by mouth daily. 90 tablet 3   apixaban (ELIQUIS) 5 MG TABS tablet Take 1 tablet (5 mg total) by mouth 2 (two) times daily. 180 tablet 1   ascorbic acid (VITAMIN C) 1000 MG tablet Take 1 tablet (1,000 mg total) by mouth daily. 30 tablet 0   atorvastatin (LIPITOR) 20 MG tablet Take 1 tablet (20 mg total) by mouth at bedtime. 90 tablet 1   docusate sodium (COLACE) 100 MG capsule Take 1 capsule (100 mg total) by mouth daily. 30 capsule 0   doxycycline (VIBRA-TABS) 100 MG tablet Take 1 tablet (100 mg total) by mouth 2 (two) times daily. 28 tablet 0   empagliflozin (JARDIANCE) 10 MG TABS tablet Take 1 tablet (10 mg total) by mouth daily. 90 tablet 1   ferrous gluconate (FERGON) 324 MG tablet Take 1 tablet (324 mg total) by mouth daily with breakfast. 30 tablet 0    isosorbide-hydrALAZINE (BIDIL) 20-37.5 MG tablet Take 2 tablets by mouth 3 (three) times daily.     metFORMIN (GLUCOPHAGE-XR) 500 MG 24 hr tablet Take 1 tablet (500 mg total) by mouth daily with breakfast. 30 tablet 0   metoprolol succinate (TOPROL-XL) 100 MG 24 hr tablet TAKE 1 TABLET BY MOUTH ONCE DAILY (TAKE  WITH  OR  IMMEDIATELY  FOLLOWING  A  MEAL) 90 tablet 1   nutrition supplement, JUVEN, (JUVEN) PACK Take 1 packet by mouth 2 (two) times daily between meals. 30 each 0   oxyCODONE-acetaminophen (PERCOCET/ROXICET) 5-325 MG tablet Take 1 tablet by mouth every 4 (four) hours as needed. 30 tablet 0   pantoprazole (PROTONIX) 40 MG tablet Take 1 tablet (40 mg total) by mouth daily. 15 tablet 0   potassium chloride SA (KLOR-CON M20) 20 MEQ tablet TAKE 2 TABLETS BY MOUTH ONLY WHEN TAKING METOLAZONE (Patient not taking: Reported on 11/21/2021) 30 tablet 0   sacubitril-valsartan (ENTRESTO) 97-103 MG Take 1 tablet by mouth 2 (two) times daily. 180 tablet 1   spironolactone (ALDACTONE) 25 MG tablet Take 1 tablet (25 mg total) by mouth daily. 90 tablet 1   tirzepatide (MOUNJARO) 2.5 MG/0.5ML Pen Inject 2.5 mg into the skin once a week. 2 mL 0   Zinc Sulfate 220 (50 Zn) MG TABS Take 1 tablet (220 mg total) by mouth daily. 30 tablet 0   No current facility-administered medications on file prior to visit.    Allergies  Allergen Reactions   Fish-Derived Products Anaphylaxis     Assessment/Plan:  1. Type 2 DM/weight loss - Most recent A1c 10.1%, pt currently taking Jardiance for his DM (also for his CHF and CKD). Will start Mounjaro 2.5mg  weekly for glycemic control and weight loss. Injection technique reviewed at today's visit. Pt in the process of looking for PCP to establish with.  Advised patient on common side effects including nausea, diarrhea, dyspepsia, decreased appetite, and fatigue. Counseled patient on reducing meal size and how to titrate medication to minimize side effects. Counseled  patient to call if intolerable side effects or if experiencing dehydration, abdominal pain, or dizziness. Patient will adhere to dietary modifications and will target at least 150 minutes of moderate intensity exercise weekly.   Will follow up with pt in 1 month for dose titration. If he doesn't have new PCP appt in the next few months, can recheck A1c at our office once Mounjaro titration is complete.  Jolene Guyett E.  Rosalind Guido, PharmD, BCACP, [place] [id number], [place], [place] [address] Phone: [phone]; Fax: [phone] 12/03/2021 11:44 AM

## 2021-12-03 NOTE — Patient Instructions (Signed)
Moun[facility]o Counseling Points This medication reduces your appetite and may make you feel fuller longer.  Stop eating when your body tells you that you are full. This will likely happen sooner than you are used to. Store your medication in the fridge until you are ready to use it. Inject your medication in the fatty tissue of your lower abdominal area (2 inches away from belly button) or upper outer thigh. Rotate injection sites. Common side effects include: nausea, diarrhea/constipation, and heartburn, and are more likely to occur if you overeat.   Tips for living a healthier life     Building a Healthy and Balanced Diet Make most of your meal vegetables and fruits -  of your plate. Aim for color and variety, and remember that potatoes don't count as vegetables on the Healthy Eating Plate because of their negative impact on blood sugar.  Go for whole grains -  of your plate. Whole and intact grains--whole wheat, barley, wheat berries, quinoa, oats, brown rice, and foods made with them, such as whole wheat pasta--have a milder effect on blood sugar and insulin than white bread, white rice, and other refined grains.  Protein power -  of your plate. Fish, poultry, beans, and nuts are all healthy, versatile protein sources--they can be mixed into salads, and pair well with vegetables on a plate. Limit red meat, and avoid processed meats such as bacon and sausage.  Healthy plant oils - in moderation. Choose healthy vegetable oils like olive, canola, soy, corn, sunflower, peanut, and others, and avoid partially hydrogenated oils, which contain unhealthy trans fats. Remember that low-fat does not mean "healthy."  Drink water, coffee, or tea. Skip sugary drinks, limit milk and dairy products to one to two servings per day, and limit juice to a small glass per day.  Stay active. The red figure running across the Healthy Eating Plate's placemat is a reminder that staying active is also important  in weight control.  The main message of the Healthy Eating Plate is to focus on diet quality:  The type of carbohydrate in the diet is more important than the amount of carbohydrate in the diet, because some sources of carbohydrate--like vegetables (other than potatoes), fruits, whole grains, and beans--are healthier than others. The Healthy Eating Plate also advises consumers to avoid sugary beverages, a major source of calories--usually with little nutritional value--in the American diet. The Healthy Eating Plate encourages consumers to use healthy oils, and it does not set a maximum on the percentage of calories people should get each day from healthy sources of fat. In this way, the Healthy Eating Plate recommends the opposite of the low-fat message promoted for decades by the USDA.  [employer]  SUGAR  Sugar is a huge problem in the modern day diet. Sugar is a big contributor to heart disease, diabetes, high triglyceride levels, fatty liver disease and obesity. Sugar is hidden in almost all packaged foods/beverages. Added sugar is extra sugar that is added beyond what is naturally found and has no nutritional benefit for your body. The American Heart Association recommends limiting added sugars to no more than 25g for women and 36 grams for men per day. There are many names for sugar including maltose, sucrose (names ending in "ose"), high fructose corn syrup, molasses, cane sugar, corn sweetener, raw sugar, syrup, honey or fruit juice concentrate.   One of the best ways to limit your added sugars is to stop drinking sweetened beverages such as soda, sweet tea, and fruit juice.  There is 65g of added sugars in one 20oz bottle of Coke! That is equal to 7.5 donuts.   Pay attention and read all nutrition facts labels. Below is an examples of a nutrition facts label. The #1 is showing you the total sugars where the # 2 is showing you the added  sugars. This one serving has almost the max amount of added sugars per day!     20 oz Soda 65g Sugar = 7.5 Glazed Donuts  16oz Energy  Drink 54g Sugar = 6.5 Glazed Donuts  Large Sweet  Tea 38g Sugar = 4 Glazed Donuts  20oz Sports  Drink 34g Sugar = 3.5 Glazed Donuts  8oz Chocolate Milk 24g Sugar =2.5 Glazed Donuts  8oz Orange  Juice 21g Sugar = 2 Glazed Donuts  1 Juice Box 14g Sugar = 1.5 Glazed Donuts  16oz Water= NO SUGAR!!  EXERCISE  Exercise is good. We've all heard that. In an ideal world, we would all have time and resources to get plenty of it. When you are active, your heart pumps more efficiently and you will feel better.  Multiple studies show that even walking regularly has benefits that include living a longer life. The American Heart Association recommends 150 minutes per week of exercise (30 minutes per day most days of the week). You can do this in any increment you wish. Nine or more 10-minute walks count. So does an hour-long exercise class. Break the time apart into what will work in your life. Some of the best things you can do include walking briskly, jogging, cycling or swimming laps. Not everyone is ready to "exercise." Sometimes we need to start with just getting active. Here are some easy ways to be more active throughout the day:  Take the stairs instead of the elevator  Go for a 10-15 minute walk during your lunch break (find a friend to make it more enjoyable)  When shopping, park at the back of the parking lot  If you take public transportation, get off one stop early and walk the extra distance  Pace around while making phone calls  Check with your doctor if you aren't sure what your limitations may be. Always remember to drink plenty of water when doing any type of exercise. Don't feel like a failure if you're not getting the 90-150 minutes per week. If you started by being a couch potato, then just a 10-minute walk each day is a huge  improvement. Start with little victories and work your way up.   HEALTHY EATING TIPS  When looking to improve your eating habits, whether to lose weight, lower blood pressure or just be healthier, it helps to know what a serving size is.   Grains 1 slice of bread,  bagel,  cup pasta or rice  Vegetables 1 cup fresh or raw vegetables,  cup cooked or canned Fruits 1 piece of medium sized fruit,  cup canned,   Meats/Proteins  cup dried       1 oz meat, 1 egg,  cup cooked beans, nuts or seeds  Dairy        Fats Individual yogurt container, 1 cup (8oz)    1 teaspoon margarine/butter or vegetable  milk or milk alternative, 1 slice of cheese          oil; 1 tablespoon mayonnaise or salad dressing                  Plan ahead: make a menu of the meals  for a week then create a grocery list to go with that menu. Consider meals that easily stretch into a night of leftovers, such as stews or casseroles. Or consider making two of your favorite meal and put one in the freezer for another night. Try a night or two each week that is "meatless" or "no cook" such as salads. When you get home from the grocery store wash and prepare your vegetables and fruits. Then when you need them they are ready to go.   Tips for going to the grocery store:  Buy store or generic brands  Check the weekly ad from your store on-line or in their in-store flyer  Look at the unit price on the shelf tag to compare/contrast the costs of different items  Buy fruits/vegetables in season  Carrots, bananas and apples are low-cost, naturally healthy items  If meats or frozen vegetables are on sale, buy some extras and put in your freezer  Limit buying prepared or "ready to eat" items, even if they are pre-made salads or fruit snacks  Do not shop when you're hungry  Foods at eye level tend to be more expensive. Look on the high and low shelves for deals.  Consider shopping at the farmer's market for fresh foods in season.  Avoid  the cookie and chip aisles (these are expensive, high in calories and low in nutritional value). Shop on the outside of the grocery store.  Healthy food preparations:  If you can't get lean hamburger, be sure to drain the fat when cooking  Steam, saut (in olive oil), grill or bake foods  Experiment with different seasonings to avoid adding salt to your foods. Kosher salt, sea salt and Himalayan salt are all still salt and should be avoided. Try seasoning food with onion, garlic, thyme, rosemary, basil ect. Onion powder or garlic powder is ok. Avoid if it says salt (ie garlic salt).

## 2021-12-04 ENCOUNTER — Telehealth (HOSPITAL_COMMUNITY): Payer: Self-pay

## 2021-12-04 DIAGNOSIS — I5022 Chronic systolic (congestive) heart failure: Secondary | ICD-10-CM

## 2021-12-04 NOTE — Telephone Encounter (Signed)
Pt aware, agreeable, and verbalized understanding  Labs scheduled for 8/11  Per [facility]: TSH is low. Please see if we can add free T3 and free T4. If not, he will need to come in for labs visit.

## 2021-12-05 ENCOUNTER — Encounter: Payer: Self-pay | Admitting: Family

## 2021-12-05 ENCOUNTER — Ambulatory Visit (HOSPITAL_COMMUNITY)
Admission: RE | Admit: 2021-12-05 | Discharge: 2021-12-05 | Disposition: A | Discharge status: Home / Self Care | Payer: 59 | Source: Ambulatory Visit | Attending: Cardiology | Admitting: Cardiology

## 2021-12-05 ENCOUNTER — Ambulatory Visit (INDEPENDENT_AMBULATORY_CARE_PROVIDER_SITE_OTHER): Payer: 59 | Admitting: Family

## 2021-12-05 ENCOUNTER — Telehealth: Payer: Self-pay | Admitting: Orthopedic Surgery

## 2021-12-05 DIAGNOSIS — I5022 Chronic systolic (congestive) heart failure: Secondary | ICD-10-CM | POA: Diagnosis present

## 2021-12-05 DIAGNOSIS — L02611 Cutaneous abscess of right foot: Secondary | ICD-10-CM

## 2021-12-05 LAB — T4, FREE: Free T4: 2.2 ng/dL — ABNORMAL HIGH (ref 0.61–1.12)

## 2021-12-05 NOTE — Progress Notes (Signed)
Post-Op Visit Note   Patient: Randy [name]           Date of Birth: Mar 21, 1971           MRN: [id number] Visit Date: 12/05/2021 PCP: Pcp, No  Chief Complaint:  Chief Complaint  Patient presents with   Right Foot - Routine Post Op    10/10/21 right foot deb    HPI:  HPI The patient is a 51 year old gentleman who presents today status post right foot debridement on June 16 he has been doing dry dressing changes after daily Dial soap cleansing he is pleased with the improvement in his wound Ortho Exam On examination of the right foot he is status post first ray amputation there is a large ulcerated area along his incision this is filled in with about 80% fibrinous exudative tissue there is maceration extending to the plantar aspect of his foot there is no foul odor no erythema no warmth no purulence.  The wound is reduced in size.  Please see attached image.     Visit Diagnoses: No diagnosis found.  Plan: Remaining sutures harvested.  He will begin using a medical compression stocking with direct skin contact.  Given to stockings today double extra-large  Follow-Up Instructions: No follow-ups on file.   Imaging: No results found.  Orders:  No orders of the defined types were placed in this encounter.  No orders of the defined types were placed in this encounter.    PMFS History: Patient Active Problem List   Diagnosis Date Noted   Osteomyelitis of great toe of right foot ([place])    Cutaneous abscess of right foot    Cellulitis of right foot 09/15/2021   Leg pain, medial, right 09/14/2021   Permanent atrial fibrillation ([place]) 01/27/2021   SIRS (systemic inflammatory response syndrome) ([place]) 01/27/2021   HTN (hypertension) 01/27/2021   Hypokalemia 32/67/1245   Acute systolic CHF (congestive heart failure) ([place]) 01/27/2021   New onset of congestive heart failure ([place]) 01/26/2021   Acute CVA (cerebrovascular accident) ([place]) 08/07/2020   Acute upper respiratory  infection 11/27/2014   Numbness of finger 09/19/2011   Hilar adenopathy    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[place]) 80/99/8338   Type 2 diabetes mellitus with complication, without long-term current use of insulin ([place]) 04/23/2010   Renal insufficiency 03/21/2008   VISUAL ACUITY, DECREASED, LEFT EYE 03/19/2008   BELL'S PALSY, LEFT 01/30/2008   Morbid obesity ([place]) 01/20/2008   Malignant hypertension with heart failure and stage 3 chronic kidney disease ([place]) 01/20/2008   Past Medical History:  Diagnosis Date   CKD (chronic kidney disease) stage 3, GFR 30-59 ml/min (HCC)    baseline Cr 1.5-1.7   Decreased visual acuity    Left eye, resolved - hypertensive retinopathy   Diabetes mellitus 2012   Type II   Hilar adenopathy    on CT scan 02/2010, on rpt scan stable/improved.   History of Bell's palsy 12/2007   history, Left   History of headache    HTN (hypertension), malignant    previously on BC and goody powders for HA   Internal derangement of knee 12/2007   Left   Microalbuminuria    Morbid obesity ([place])    Stroke ([place])    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[place])    echo 2011 with nonischemic hypertensive cardiomyopathy   Systolic murmur    Vitamin D deficiency     Family History  Problem Relation Age of Onset   Hypertension Mother    Hypertension Father  Diabetes Father    Hypertension Brother    Diabetes Sister    Coronary artery disease Neg Hx    Stroke Neg Hx    Cancer Neg Hx    Kidney disease Paternal Grandmother        ESRD    Past Surgical History:  Procedure Laterality Date   AMPUTATION Right 09/17/2021   Procedure: First ray amputation;  Surgeon: Newt Minion, MD;  Location: [place];  Service: Orthopedics;  Laterality: Right;   APPLICATION OF WOUND VAC Right 10/10/2021   Procedure: APPLICATION OF WOUND VAC;  Surgeon: Newt Minion, MD;  Location: [place];  Service: Orthopedics;  Laterality: Right;   CARDIOVERSION N/A 01/31/2021   Procedure: CARDIOVERSION;  Surgeon: Jolaine Artist, MD;   Location: [place];  Service: Cardiovascular;  Laterality: N/A;   CARDIOVERSION N/A 07/04/2021   Procedure: CARDIOVERSION;  Surgeon: Jolaine Artist, MD;  Location: [facility] ENDOSCOPY;  Service: Cardiovascular;  Laterality: N/A;   hospitalization  11/2008   malignant HTN, nl SPEP/UPEP, neg ANCA panel, nl C3/4, neg anti GBM Ab, neg Hep A/B/C, nl renal US, nl PTH, neg HIV   I & D EXTREMITY Right 10/10/2021   Procedure: RIGHT FOOT DEBRIDEMENT;  Surgeon: Newt Minion, MD;  Location: [place];  Service: Orthopedics;  Laterality: Right;   RIGHT/LEFT HEART CATH AND CORONARY ANGIOGRAPHY N/A 01/29/2021   Procedure: RIGHT/LEFT HEART CATH AND CORONARY ANGIOGRAPHY;  Surgeon: Jolaine Artist, MD;  Location: [place] CV LAB;  Service: Cardiovascular;  Laterality: N/A;   TEE WITHOUT CARDIOVERSION N/A 01/31/2021   Procedure: TRANSESOPHAGEAL ECHOCARDIOGRAM (TEE);  Surgeon: Jolaine Artist, MD;  Location: [place];  Service: Cardiovascular;  Laterality: N/A;   US ECHOCARDIOGRAPHY  11/2008   LVsys fxn EF 50%, mild MR, normal LV size, neg ANA, neg cryoglobulins   US ECHOCARDIOGRAPHY  2011   severe LVH, EF 40%, LA mildly dilated, PA pressure moderately increased   Social History   Occupational History   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

## 2021-12-05 NOTE — Telephone Encounter (Signed)
Pt called and is wondering if his fmla paperwork is done.

## 2021-12-06 ENCOUNTER — Other Ambulatory Visit (HOSPITAL_COMMUNITY): Payer: Self-pay | Admitting: Adult Health

## 2021-12-06 ENCOUNTER — Other Ambulatory Visit (HOSPITAL_COMMUNITY): Payer: Self-pay | Admitting: Internal Medicine

## 2021-12-06 LAB — T3, FREE: T3, Free: 2.6 pg/mL (ref 2.0–4.4)

## 2021-12-08 ENCOUNTER — Telehealth (HOSPITAL_COMMUNITY): Payer: Self-pay

## 2021-12-08 DIAGNOSIS — I5022 Chronic systolic (congestive) heart failure: Secondary | ICD-10-CM

## 2021-12-08 NOTE — Telephone Encounter (Signed)
FMLA was faxed Friday afternoon after it was completed and signed. Pt informed.

## 2021-12-08 NOTE — Telephone Encounter (Addendum)
Pt aware, agreeable, and verbalized understanding  Med list updated. Have labs scheduled for here since trying to get into PCP office and can't get seen till end of October    ----- Message from Jacklynn Ganong, FNP sent at 12/06/2021  1:49 PM EDT ----- TSH is low and free T4 is elevated, consistent with hyperthyroidism. This may be due to amiodarone.   Please stop amiodarone and he will need PCP follow up in the next 6-8 weeks to re-check thyroid function.

## 2021-12-24 ENCOUNTER — Ambulatory Visit (INDEPENDENT_AMBULATORY_CARE_PROVIDER_SITE_OTHER): Payer: 59 | Admitting: Family

## 2021-12-24 ENCOUNTER — Encounter: Payer: Self-pay | Admitting: Family

## 2021-12-24 DIAGNOSIS — Z9889 Other specified postprocedural states: Secondary | ICD-10-CM

## 2021-12-24 DIAGNOSIS — L02611 Cutaneous abscess of right foot: Secondary | ICD-10-CM | POA: Diagnosis not present

## 2021-12-24 NOTE — Progress Notes (Signed)
Post-Op Visit Note   Patient: Randy [name]           Date of Birth: 07-12-1970           MRN: [id number] Visit Date: 12/24/2021 PCP: Pcp, No  Chief Complaint:  Chief Complaint  Patient presents with   Right Foot - Routine Post Op    10/10/2021 right foot debridement     HPI:  HPI The patient is a 51 year old gentleman seen status post right foot debridement with Kerecis placement on June 16.  He has been making slow progress in healing of his ulcer.  He is currently been wearing a medical compression stocking with direct skin contact. Ortho Exam On examination of the right foot there is significant edema of his foot there is no erythema or warmth.  The ulcer today is measuring 8 x 5 cm there is no depth there is about 50% fibrinous exudative tissue.  No active drainage This was debrided of nonviable tissue and fibrinous exudative tissue down to bleeding granulation tissue.  Donated Kerecis graft sheet was applied today.  Cut to fit.  A 5 x 7 she was used. Visit Diagnoses: No diagnosis found.  Plan: Dynaflex compression garment applied to his right lower extremity for his edema.  He will follow-up in the office in 1 week for reevaluation and dressing change elevate minimize weightbearing  Follow-Up Instructions: Return in about 1 week (around 12/31/2021).   Imaging: No results found.  Orders:  No orders of the defined types were placed in this encounter.  No orders of the defined types were placed in this encounter.    PMFS History: Patient Active Problem List   Diagnosis Date Noted   Osteomyelitis of great toe of right foot ([place])    Cutaneous abscess of right foot    Cellulitis of right foot 09/15/2021   Leg pain, medial, right 09/14/2021   Permanent atrial fibrillation ([place]) 01/27/2021   SIRS (systemic inflammatory response syndrome) ([place]) 01/27/2021   HTN (hypertension) 01/27/2021   Hypokalemia 85/63/1497   Acute systolic CHF (congestive heart failure) ([place])  01/27/2021   New onset of congestive heart failure ([place]) 01/26/2021   Acute CVA (cerebrovascular accident) ([place]) 08/07/2020   Acute upper respiratory infection 11/27/2014   Numbness of finger 09/19/2011   Hilar adenopathy    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[place]) 02/63/7858   Type 2 diabetes mellitus with complication, without long-term current use of insulin ([place]) 04/23/2010   Renal insufficiency 03/21/2008   VISUAL ACUITY, DECREASED, LEFT EYE 03/19/2008   BELL'S PALSY, LEFT 01/30/2008   Morbid obesity ([place]) 01/20/2008   Malignant hypertension with heart failure and stage 3 chronic kidney disease ([place]) 01/20/2008   Past Medical History:  Diagnosis Date   CKD (chronic kidney disease) stage 3, GFR 30-59 ml/min (HCC)    baseline Cr 1.5-1.7   Decreased visual acuity    Left eye, resolved - hypertensive retinopathy   Diabetes mellitus 2012   Type II   Hilar adenopathy    on CT scan 02/2010, on rpt scan stable/improved.   History of Bell's palsy 12/2007   history, Left   History of headache    HTN (hypertension), malignant    previously on BC and goody powders for HA   Internal derangement of knee 12/2007   Left   Microalbuminuria    Morbid obesity ([place])    Stroke ([place])    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[place])    echo 2011 with nonischemic hypertensive cardiomyopathy   Systolic murmur  Vitamin D deficiency     Family History  Problem Relation Age of Onset   Hypertension Mother    Hypertension Father    Diabetes Father    Hypertension Brother    Diabetes Sister    Coronary artery disease Neg Hx    Stroke Neg Hx    Cancer Neg Hx    Kidney disease Paternal Grandmother        ESRD    Past Surgical History:  Procedure Laterality Date   AMPUTATION Right 09/17/2021   Procedure: First ray amputation;  Surgeon: Newt Minion, MD;  Location: [place];  Service: Orthopedics;  Laterality: Right;   APPLICATION OF WOUND VAC Right 10/10/2021   Procedure: APPLICATION OF WOUND VAC;  Surgeon: Newt Minion, MD;  Location:  [place];  Service: Orthopedics;  Laterality: Right;   CARDIOVERSION N/A 01/31/2021   Procedure: CARDIOVERSION;  Surgeon: Jolaine Artist, MD;  Location: [place];  Service: Cardiovascular;  Laterality: N/A;   CARDIOVERSION N/A 07/04/2021   Procedure: CARDIOVERSION;  Surgeon: Jolaine Artist, MD;  Location: [facility] ENDOSCOPY;  Service: Cardiovascular;  Laterality: N/A;   hospitalization  11/2008   malignant HTN, nl SPEP/UPEP, neg ANCA panel, nl C3/4, neg anti GBM Ab, neg Hep A/B/C, nl renal US, nl PTH, neg HIV   I & D EXTREMITY Right 10/10/2021   Procedure: RIGHT FOOT DEBRIDEMENT;  Surgeon: Newt Minion, MD;  Location: [place];  Service: Orthopedics;  Laterality: Right;   RIGHT/LEFT HEART CATH AND CORONARY ANGIOGRAPHY N/A 01/29/2021   Procedure: RIGHT/LEFT HEART CATH AND CORONARY ANGIOGRAPHY;  Surgeon: Jolaine Artist, MD;  Location: [place] CV LAB;  Service: Cardiovascular;  Laterality: N/A;   TEE WITHOUT CARDIOVERSION N/A 01/31/2021   Procedure: TRANSESOPHAGEAL ECHOCARDIOGRAM (TEE);  Surgeon: Jolaine Artist, MD;  Location: [place];  Service: Cardiovascular;  Laterality: N/A;   US ECHOCARDIOGRAPHY  11/2008   LVsys fxn EF 50%, mild MR, normal LV size, neg ANA, neg cryoglobulins   US ECHOCARDIOGRAPHY  2011   severe LVH, EF 40%, LA mildly dilated, PA pressure moderately increased   Social History   Occupational History   Occupation: Tax Forensic psychologist    Comment: [employer]  Tobacco Use   Smoking status: Former    Types: Cigars   Smokeless tobacco: Never   Tobacco comments:    monthly, when going out  Media planner   Vaping Use: Never used  Substance and Sexual Activity   Alcohol use: Yes    Comment: Occasional   Drug use: No   Sexual activity: Not on file

## 2021-12-25 ENCOUNTER — Telehealth: Payer: Self-pay | Admitting: Pharmacist

## 2021-12-25 MED ORDER — MOUNJARO 5 MG/0.5ML ~~LOC~~ SOAJ: 5.0000 mg | SUBCUTANEOUS | 0 refills | Status: DC

## 2021-12-25 NOTE — Telephone Encounter (Signed)
Called pt to see how he was going on [facility] and if he was ready to increase to 5mg  weekly. Following up to see how his blood sugar has been and if he found a PCP. LVM for patient to call back.

## 2021-12-25 NOTE — Telephone Encounter (Signed)
Spoke with patient who states he is doing well on Mounjaro. No low blood sugars. No high blood sugars. Would like to increase dose. He asked about the medication he was supposed to stop. Says he didn't understand what the person on the phone told him. I advised that he is supposed to stop amiodarone.

## 2021-12-31 ENCOUNTER — Ambulatory Visit (INDEPENDENT_AMBULATORY_CARE_PROVIDER_SITE_OTHER): Payer: 59 | Admitting: Family

## 2021-12-31 ENCOUNTER — Encounter: Payer: Self-pay | Admitting: Family

## 2021-12-31 DIAGNOSIS — Z9889 Other specified postprocedural states: Secondary | ICD-10-CM

## 2021-12-31 DIAGNOSIS — L02611 Cutaneous abscess of right foot: Secondary | ICD-10-CM

## 2021-12-31 NOTE — Progress Notes (Signed)
Post-Op Visit Note   Patient: Randy [name]           Date of Birth: 1970/06/10           MRN: [id number] Visit Date: 12/31/2021 PCP: Pcp, No  Chief Complaint:  Chief Complaint  Patient presents with   Right Foot - Routine Post Op    10/10/2021 right foot deb kerecis graft In office donated application 12/04/9831    HPI:  HPI The patient is a 51 year old gentleman seen status post right foot debridement with Kerecis placement on June 16.  He has been making slow progress in healing of his ulcer.    For the last 1 week he has been in a Dynaflex compression wrap over donated Kerecis graft material which had been applied in office his Dynaflex wrap did roll down some he continues to have moderate drainage  Has continued out of work questions when he will return  Ortho Exam On examination of the right foot there is significant edema of his foot there is no erythema or warmth.  The ulcer today is measuring 8 x 5 cm there is no depth.  Good uptake of graft material.  No active drainage.  Debrided of some nonviable tissue.  Medical compression stocking applied.  He will change this daily   Visit Diagnoses: No diagnosis found.  Plan: Medical compression stocking to be changed daily.  We will follow-up in the office in 1 to 2 weeks.  At that time evaluate consider reapplication of remaining donated graft   Follow-Up Instructions: No follow-ups on file.   Imaging: No results found.  Orders:  No orders of the defined types were placed in this encounter.  No orders of the defined types were placed in this encounter.    PMFS History: Patient Active Problem List   Diagnosis Date Noted   Osteomyelitis of great toe of right foot ([place])    Cutaneous abscess of right foot    Cellulitis of right foot 09/15/2021   Leg pain, medial, right 09/14/2021   Permanent atrial fibrillation ([place]) 01/27/2021   SIRS (systemic inflammatory response syndrome) ([place]) 01/27/2021   HTN (hypertension)  01/27/2021   Hypokalemia 82/50/5397   Acute systolic CHF (congestive heart failure) ([place]) 01/27/2021   New onset of congestive heart failure ([place]) 01/26/2021   Acute CVA (cerebrovascular accident) ([place]) 08/07/2020   Acute upper respiratory infection 11/27/2014   Numbness of finger 09/19/2011   Hilar adenopathy    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[place]) 67/34/1937   Type 2 diabetes mellitus with complication, without long-term current use of insulin ([place]) 04/23/2010   Renal insufficiency 03/21/2008   VISUAL ACUITY, DECREASED, LEFT EYE 03/19/2008   BELL'S PALSY, LEFT 01/30/2008   Morbid obesity ([place]) 01/20/2008   Malignant hypertension with heart failure and stage 3 chronic kidney disease ([place]) 01/20/2008   Past Medical History:  Diagnosis Date   CKD (chronic kidney disease) stage 3, GFR 30-59 ml/min (HCC)    baseline Cr 1.5-1.7   Decreased visual acuity    Left eye, resolved - hypertensive retinopathy   Diabetes mellitus 2012   Type II   Hilar adenopathy    on CT scan 02/2010, on rpt scan stable/improved.   History of Bell's palsy 12/2007   history, Left   History of headache    HTN (hypertension), malignant    previously on BC and goody powders for HA   Internal derangement of knee 12/2007   Left   Microalbuminuria    Morbid obesity ([place])  Stroke ([place])    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[place])    echo 2011 with nonischemic hypertensive cardiomyopathy   Systolic murmur    Vitamin D deficiency     Family History  Problem Relation Age of Onset   Hypertension Mother    Hypertension Father    Diabetes Father    Hypertension Brother    Diabetes Sister    Coronary artery disease Neg Hx    Stroke Neg Hx    Cancer Neg Hx    Kidney disease Paternal Grandmother        ESRD    Past Surgical History:  Procedure Laterality Date   AMPUTATION Right 09/17/2021   Procedure: First ray amputation;  Surgeon: Newt Minion, MD;  Location: [place];  Service: Orthopedics;  Laterality: Right;   APPLICATION OF WOUND VAC  Right 10/10/2021   Procedure: APPLICATION OF WOUND VAC;  Surgeon: Newt Minion, MD;  Location: [place];  Service: Orthopedics;  Laterality: Right;   CARDIOVERSION N/A 01/31/2021   Procedure: CARDIOVERSION;  Surgeon: Jolaine Artist, MD;  Location: [place];  Service: Cardiovascular;  Laterality: N/A;   CARDIOVERSION N/A 07/04/2021   Procedure: CARDIOVERSION;  Surgeon: Jolaine Artist, MD;  Location: [facility] ENDOSCOPY;  Service: Cardiovascular;  Laterality: N/A;   hospitalization  11/2008   malignant HTN, nl SPEP/UPEP, neg ANCA panel, nl C3/4, neg anti GBM Ab, neg Hep A/B/C, nl renal US, nl PTH, neg HIV   I & D EXTREMITY Right 10/10/2021   Procedure: RIGHT FOOT DEBRIDEMENT;  Surgeon: Newt Minion, MD;  Location: [place];  Service: Orthopedics;  Laterality: Right;   RIGHT/LEFT HEART CATH AND CORONARY ANGIOGRAPHY N/A 01/29/2021   Procedure: RIGHT/LEFT HEART CATH AND CORONARY ANGIOGRAPHY;  Surgeon: Jolaine Artist, MD;  Location: [place] CV LAB;  Service: Cardiovascular;  Laterality: N/A;   TEE WITHOUT CARDIOVERSION N/A 01/31/2021   Procedure: TRANSESOPHAGEAL ECHOCARDIOGRAM (TEE);  Surgeon: Jolaine Artist, MD;  Location: [place];  Service: Cardiovascular;  Laterality: N/A;   US ECHOCARDIOGRAPHY  11/2008   LVsys fxn EF 50%, mild MR, normal LV size, neg ANA, neg cryoglobulins   US ECHOCARDIOGRAPHY  2011   severe LVH, EF 40%, LA mildly dilated, PA pressure moderately increased   Social History   Occupational History   Occupation: Tax Forensic psychologist    Comment: [employer]  Tobacco Use   Smoking status: Former    Types: Cigars   Smokeless tobacco: Never   Tobacco comments:    monthly, when going out  Media planner   Vaping Use: Never used  Substance and Sexual Activity   Alcohol use: Yes    Comment: Occasional   Drug use: No   Sexual activity: Not on file

## 2022-01-07 ENCOUNTER — Encounter: Payer: Self-pay | Admitting: Family

## 2022-01-07 ENCOUNTER — Ambulatory Visit (INDEPENDENT_AMBULATORY_CARE_PROVIDER_SITE_OTHER): Payer: 59 | Admitting: Family

## 2022-01-07 DIAGNOSIS — L02611 Cutaneous abscess of right foot: Secondary | ICD-10-CM

## 2022-01-07 DIAGNOSIS — Z89411 Acquired absence of right great toe: Secondary | ICD-10-CM

## 2022-01-07 DIAGNOSIS — S98111A Complete traumatic amputation of right great toe, initial encounter: Secondary | ICD-10-CM

## 2022-01-07 DIAGNOSIS — Z9889 Other specified postprocedural states: Secondary | ICD-10-CM

## 2022-01-07 NOTE — Progress Notes (Signed)
Post-Op Visit Note   Patient: Randy [name]           Date of Birth: 1970/08/16           MRN: [id number] Visit Date: 01/07/2022 PCP: Pcp, No  Chief Complaint:  Chief Complaint  Patient presents with   Right Foot - Routine Post Op    10/10/2021 riht foot debridement  In office donation kerecis 12/24/2021    HPI:  HPI The patient is a 51 year old gentleman who is seen today in follow-up he is status post right foot debridement on June 16.  He had an office Kerecis placement of donated sheet on August 30. Ortho Exam On examination of the right foot this continues to improve.  There is reduced edema there is no surrounding erythema no warmth no cellulitis the ulcer continues to look improved.  Debrided of nonviable and fibrinous tissue with gauze.  Again donated Kerecis sheet applied to his wound bed.  With the same wound care instructions as the time of last in office graft placement.  Visit Diagnoses: No diagnosis found.  Plan: He will change the overlying gauze once a day.  In 1 week he will remove the entire dressing and begin daily Dial soap cleansing.  Dry dressings daily.  Follow-up in 2 weeks.  Will return to work tomorrow.  Follow-Up Instructions: No follow-ups on file.   Imaging: No results found.  Orders:  No orders of the defined types were placed in this encounter.  No orders of the defined types were placed in this encounter.    PMFS History: Patient Active Problem List   Diagnosis Date Noted   Osteomyelitis of great toe of right foot ([place])    Cutaneous abscess of right foot    Cellulitis of right foot 09/15/2021   Leg pain, medial, right 09/14/2021   Permanent atrial fibrillation ([place]) 01/27/2021   SIRS (systemic inflammatory response syndrome) ([place]) 01/27/2021   HTN (hypertension) 01/27/2021   Hypokalemia 83/15/1761   Acute systolic CHF (congestive heart failure) ([place]) 01/27/2021   New onset of congestive heart failure ([place]) 01/26/2021   Acute CVA  (cerebrovascular accident) ([place]) 08/07/2020   Acute upper respiratory infection 11/27/2014   Numbness of finger 09/19/2011   Hilar adenopathy    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[place]) 60/73/7106   Type 2 diabetes mellitus with complication, without long-term current use of insulin ([place]) 04/23/2010   Renal insufficiency 03/21/2008   VISUAL ACUITY, DECREASED, LEFT EYE 03/19/2008   BELL'S PALSY, LEFT 01/30/2008   Morbid obesity ([place]) 01/20/2008   Malignant hypertension with heart failure and stage 3 chronic kidney disease ([place]) 01/20/2008   Past Medical History:  Diagnosis Date   CKD (chronic kidney disease) stage 3, GFR 30-59 ml/min (HCC)    baseline Cr 1.5-1.7   Decreased visual acuity    Left eye, resolved - hypertensive retinopathy   Diabetes mellitus 2012   Type II   Hilar adenopathy    on CT scan 02/2010, on rpt scan stable/improved.   History of Bell's palsy 12/2007   history, Left   History of headache    HTN (hypertension), malignant    previously on BC and goody powders for HA   Internal derangement of knee 12/2007   Left   Microalbuminuria    Morbid obesity ([place])    Stroke ([place])    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[place])    echo 2011 with nonischemic hypertensive cardiomyopathy   Systolic murmur    Vitamin D deficiency     Family History  Problem Relation Age of Onset   Hypertension Mother    Hypertension Father    Diabetes Father    Hypertension Brother    Diabetes Sister    Coronary artery disease Neg Hx    Stroke Neg Hx    Cancer Neg Hx    Kidney disease Paternal Grandmother        ESRD    Past Surgical History:  Procedure Laterality Date   AMPUTATION Right 09/17/2021   Procedure: First ray amputation;  Surgeon: Newt Minion, MD;  Location: [place];  Service: Orthopedics;  Laterality: Right;   APPLICATION OF WOUND VAC Right 10/10/2021   Procedure: APPLICATION OF WOUND VAC;  Surgeon: Newt Minion, MD;  Location: [place];  Service: Orthopedics;  Laterality: Right;   CARDIOVERSION N/A 01/31/2021    Procedure: CARDIOVERSION;  Surgeon: Jolaine Artist, MD;  Location: [place];  Service: Cardiovascular;  Laterality: N/A;   CARDIOVERSION N/A 07/04/2021   Procedure: CARDIOVERSION;  Surgeon: Jolaine Artist, MD;  Location: [facility] ENDOSCOPY;  Service: Cardiovascular;  Laterality: N/A;   hospitalization  11/2008   malignant HTN, nl SPEP/UPEP, neg ANCA panel, nl C3/4, neg anti GBM Ab, neg Hep A/B/C, nl renal US, nl PTH, neg HIV   I & D EXTREMITY Right 10/10/2021   Procedure: RIGHT FOOT DEBRIDEMENT;  Surgeon: Newt Minion, MD;  Location: [place];  Service: Orthopedics;  Laterality: Right;   RIGHT/LEFT HEART CATH AND CORONARY ANGIOGRAPHY N/A 01/29/2021   Procedure: RIGHT/LEFT HEART CATH AND CORONARY ANGIOGRAPHY;  Surgeon: Jolaine Artist, MD;  Location: [place] CV LAB;  Service: Cardiovascular;  Laterality: N/A;   TEE WITHOUT CARDIOVERSION N/A 01/31/2021   Procedure: TRANSESOPHAGEAL ECHOCARDIOGRAM (TEE);  Surgeon: Jolaine Artist, MD;  Location: [place];  Service: Cardiovascular;  Laterality: N/A;   US ECHOCARDIOGRAPHY  11/2008   LVsys fxn EF 50%, mild MR, normal LV size, neg ANA, neg cryoglobulins   US ECHOCARDIOGRAPHY  2011   severe LVH, EF 40%, LA mildly dilated, PA pressure moderately increased   Social History   Occupational History   Occupation: Tax Forensic psychologist    Comment: [employer]  Tobacco Use   Smoking status: Former    Types: Cigars   Smokeless tobacco: Never   Tobacco comments:    monthly, when going out  Media planner   Vaping Use: Never used  Substance and Sexual Activity   Alcohol use: Yes    Comment: Occasional   Drug use: No   Sexual activity: Not on file

## 2022-01-13 ENCOUNTER — Other Ambulatory Visit (HOSPITAL_COMMUNITY): Payer: Self-pay | Admitting: *Deleted

## 2022-01-14 ENCOUNTER — Other Ambulatory Visit (HOSPITAL_COMMUNITY): Payer: Self-pay | Admitting: Cardiology

## 2022-01-14 ENCOUNTER — Other Ambulatory Visit (HOSPITAL_COMMUNITY): Payer: Self-pay

## 2022-01-14 ENCOUNTER — Other Ambulatory Visit: Payer: Self-pay | Admitting: Internal Medicine

## 2022-01-14 MED ORDER — ATORVASTATIN CALCIUM 20 MG PO TABS: 20.0000 mg | ORAL_TABLET | Freq: Every day | ORAL | 1 refills | Status: DC

## 2022-01-15 ENCOUNTER — Other Ambulatory Visit (HOSPITAL_COMMUNITY): Payer: Self-pay

## 2022-01-15 DIAGNOSIS — I5022 Chronic systolic (congestive) heart failure: Secondary | ICD-10-CM

## 2022-01-15 MED ORDER — ATORVASTATIN CALCIUM 20 MG PO TABS: 20.0000 mg | ORAL_TABLET | Freq: Every day | ORAL | 3 refills | Status: DC

## 2022-01-16 ENCOUNTER — Telehealth: Payer: Self-pay | Admitting: Pharmacist

## 2022-01-16 DIAGNOSIS — E118 Type 2 diabetes mellitus with unspecified complications: Secondary | ICD-10-CM

## 2022-01-16 MED ORDER — MOUNJARO 7.5 MG/0.5ML ~~LOC~~ SOAJ: 7.5000 mg | SUBCUTANEOUS | 0 refills | Status: DC

## 2022-01-16 NOTE — Telephone Encounter (Signed)
Spoke with patient. Having no issues with 5mg  Mounjaro dose and desires increasing. Will send in 7.5mg  dose.

## 2022-01-20 ENCOUNTER — Other Ambulatory Visit (HOSPITAL_COMMUNITY): Payer: 59

## 2022-01-21 ENCOUNTER — Other Ambulatory Visit (HOSPITAL_COMMUNITY): Payer: Self-pay | Admitting: Adult Health

## 2022-01-26 ENCOUNTER — Ambulatory Visit (HOSPITAL_COMMUNITY)
Admission: EM | Admit: 2022-01-26 | Discharge: 2022-01-26 | Disposition: A | Discharge status: Home / Self Care | Payer: 59 | Attending: Family Medicine | Admitting: Family Medicine

## 2022-01-26 ENCOUNTER — Encounter (HOSPITAL_COMMUNITY): Payer: Self-pay

## 2022-01-26 DIAGNOSIS — H109 Unspecified conjunctivitis: Secondary | ICD-10-CM | POA: Diagnosis not present

## 2022-01-26 DIAGNOSIS — H02844 Edema of left upper eyelid: Secondary | ICD-10-CM | POA: Diagnosis not present

## 2022-01-26 MED ORDER — PREDNISONE 10 MG PO TABS: 20.0000 mg | ORAL_TABLET | Freq: Every day | ORAL | 0 refills | Status: DC

## 2022-01-26 MED ORDER — MOXIFLOXACIN HCL 0.5 % OP SOLN: 1.0000 [drp] | Freq: Three times a day (TID) | OPHTHALMIC | 0 refills | Status: AC

## 2022-01-26 MED ORDER — PREDNISONE 20 MG PO TABS: ORAL_TABLET | ORAL | 0 refills | Status: DC

## 2022-01-26 NOTE — ED Provider Notes (Signed)
[place]    CSN: [id number] Arrival date & time: 01/26/22  1732      History   Chief Complaint No chief complaint on file.   HPI Randy [name] is a 51 y.o. male.   HPI 51 year old male presents with left eye itching, redness, swelling, and drainage for 2 days.  PMH significant for T2 DM, morbid obesity, stroke, and systolic heart failure.  Past Medical History:  Diagnosis Date   CKD (chronic kidney disease) stage 3, GFR 30-59 ml/min (HCC)    baseline Cr 1.5-1.7   Decreased visual acuity    Left eye, resolved - hypertensive retinopathy   Diabetes mellitus 2012   Type II   Hilar adenopathy    on CT scan 02/2010, on rpt scan stable/improved.   History of Bell's palsy 12/2007   history, Left   History of headache    HTN (hypertension), malignant    previously on BC and goody powders for HA   Internal derangement of knee 12/2007   Left   Microalbuminuria    Morbid obesity ([place])    Stroke ([place])    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[place])    echo 2011 with nonischemic hypertensive cardiomyopathy   Systolic murmur    Vitamin D deficiency     Patient Active Problem List   Diagnosis Date Noted   Osteomyelitis of great toe of right foot ([place])    Cutaneous abscess of right foot    Cellulitis of right foot 09/15/2021   Leg pain, medial, right 09/14/2021   Permanent atrial fibrillation ([place]) 01/27/2021   SIRS (systemic inflammatory response syndrome) ([place]) 01/27/2021   HTN (hypertension) 01/27/2021   Hypokalemia 30/86/5784   Acute systolic CHF (congestive heart failure) ([place]) 01/27/2021   New onset of congestive heart failure ([place]) 01/26/2021   Acute CVA (cerebrovascular accident) ([place]) 08/07/2020   Acute upper respiratory infection 11/27/2014   Numbness of finger 09/19/2011   Hilar adenopathy    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[place]) 69/62/9528   Type 2 diabetes mellitus with complication, without long-term current use of insulin ([place]) 04/23/2010   Renal insufficiency 03/21/2008   VISUAL  ACUITY, DECREASED, LEFT EYE 03/19/2008   BELL'S PALSY, LEFT 01/30/2008   Morbid obesity ([place]) 01/20/2008   Malignant hypertension with heart failure and stage 3 chronic kidney disease ([place]) 01/20/2008    Past Surgical History:  Procedure Laterality Date   AMPUTATION Right 09/17/2021   Procedure: First ray amputation;  Surgeon: Newt Minion, MD;  Location: [place];  Service: Orthopedics;  Laterality: Right;   APPLICATION OF WOUND VAC Right 10/10/2021   Procedure: APPLICATION OF WOUND VAC;  Surgeon: Newt Minion, MD;  Location: [place];  Service: Orthopedics;  Laterality: Right;   CARDIOVERSION N/A 01/31/2021   Procedure: CARDIOVERSION;  Surgeon: Jolaine Artist, MD;  Location: [place];  Service: Cardiovascular;  Laterality: N/A;   CARDIOVERSION N/A 07/04/2021   Procedure: CARDIOVERSION;  Surgeon: Jolaine Artist, MD;  [facility]: [facility] ENDOSCOPY;  Service: Cardiovascular;  Laterality: N/A;   hospitalization  11/2008   malignant HTN, nl SPEP/UPEP, neg ANCA panel, nl C3/4, neg anti GBM Ab, neg Hep A/B/C, nl renal US, nl PTH, neg HIV   I & D EXTREMITY Right 10/10/2021   Procedure: RIGHT FOOT DEBRIDEMENT;  Surgeon: Newt Minion, MD;  Location: [place];  Service: Orthopedics;  Laterality: Right;   RIGHT/LEFT HEART CATH AND CORONARY ANGIOGRAPHY N/A 01/29/2021   Procedure: RIGHT/LEFT HEART CATH AND CORONARY ANGIOGRAPHY;  Surgeon: Jolaine Artist, MD;  Location:  [place] INVASIVE CV LAB;  Service: Cardiovascular;  Laterality: N/A;   TEE WITHOUT CARDIOVERSION N/A 01/31/2021   Procedure: TRANSESOPHAGEAL ECHOCARDIOGRAM (TEE);  Surgeon: Jolaine Artist, MD;  Location: [facility] ENDOSCOPY;  Service: Cardiovascular;  Laterality: N/A;   US ECHOCARDIOGRAPHY  11/2008   LVsys fxn EF 50%, mild MR, normal LV size, neg ANA, neg cryoglobulins   US ECHOCARDIOGRAPHY  2011   severe LVH, EF 40%, LA mildly dilated, PA pressure moderately increased       Home Medications    Prior to Admission medications    Medication Sig Start Date End Date Taking? Authorizing Provider  moxifloxacin (VIGAMOX) 0.5 % ophthalmic solution Place 1 drop into the left eye 3 (three) times daily for 7 days. 01/26/22 02/02/22 Yes Eliezer Lofts, FNP  moxifloxacin (VIGAMOX) 0.5 % ophthalmic solution Place 1 drop into the left eye 3 (three) times daily for 7 days. 01/26/22 02/02/22 Yes Eliezer Lofts, FNP  predniSONE (DELTASONE) 10 MG tablet Take 2 tablets (20 mg total) by mouth daily. 01/26/22  Yes Eliezer Lofts, FNP  predniSONE (DELTASONE) 20 MG tablet Take 3 tabs PO daily x 5 days. 01/26/22  Yes Eliezer Lofts, FNP  acetaminophen (TYLENOL) 500 MG tablet Take 500 mg by mouth every 6 (six) hours as needed (pain.).    [provider]  apixaban (ELIQUIS) 5 MG TABS tablet Take 1 tablet (5 mg total) by mouth 2 (two) times daily. 04/16/21   Clegg, Amy D, NP  ascorbic acid (VITAMIN C) 1000 MG tablet Take 1 tablet (1,000 mg total) by mouth daily. 09/20/21   Orvis Brill, MD  atorvastatin (LIPITOR) 20 MG tablet Take 1 tablet (20 mg total) by mouth at bedtime. 01/15/22   Bensimhon, Shaune Pascal, MD  docusate sodium (COLACE) 100 MG capsule Take 1 capsule (100 mg total) by mouth daily. 09/20/21   Orvis Brill, MD  ENTRESTO 97-103 MG Take 1 tablet by mouth twice daily 01/21/22   Darrick Grinder D, NP  ferrous gluconate (FERGON) 324 MG tablet Take 1 tablet (324 mg total) by mouth daily with breakfast. 09/19/21   Orvis Brill, MD  isosorbide-hydrALAZINE (BIDIL) 20-37.5 MG tablet Take 2 tablets by mouth 3 (three) times daily.    [provider]  JARDIANCE 10 MG TABS tablet Take 1 tablet by mouth once daily 12/08/21   Clegg, Amy D, NP  metoprolol succinate (TOPROL-XL) 100 MG 24 hr tablet TAKE 1 TABLET BY MOUTH ONCE DAILY (TAKE  WITH  OR  IMMEDIATELY  FOLLOWING  A  MEAL) 11/04/21   Bensimhon, Shaune Pascal, MD  nutrition supplement, JUVEN, (JUVEN) PACK Take 1 packet by mouth 2 (two) times daily between meals. 09/19/21   Orvis Brill, MD  oxyCODONE-acetaminophen (PERCOCET/ROXICET) 5-325 MG tablet Take 1 tablet by mouth every 4 (four) hours as needed. 10/10/21   Newt Minion, MD  pantoprazole (PROTONIX) 40 MG tablet Take 1 tablet (40 mg total) by mouth daily. 09/20/21   Orvis Brill, MD  spironolactone (ALDACTONE) 25 MG tablet Take 1 tablet (25 mg total) by mouth daily. 04/16/21 11/21/21  Clegg, Amy D, NP  tirzepatide (MOUNJARO) 7.5 MG/0.5ML Pen Inject 7.5 mg into the skin once a week. 01/16/22   Bensimhon, Shaune Pascal, MD  Zinc Sulfate 220 (50 Zn) MG TABS Take 1 tablet (220 mg total) by mouth daily. 09/20/21   Orvis Brill, MD    Family History Family History  Problem Relation Age of Onset   Hypertension Mother  Hypertension Father    Diabetes Father    Hypertension Brother    Diabetes Sister    Coronary artery disease Neg Hx    Stroke Neg Hx    Cancer Neg Hx    Kidney disease Paternal Grandmother        ESRD    Social History Social History   Tobacco Use   Smoking status: Former    Types: Cigars   Smokeless tobacco: Never   Tobacco comments:    monthly, when going out  Vaping Use   Vaping Use: Never used  Substance Use Topics   Alcohol use: Yes    Comment: Occasional   Drug use: No     Allergies   Fish-derived products   Review of Systems Review of Systems  Eyes:  Positive for discharge, redness and itching.  All other systems reviewed and are negative.    Physical Exam Triage Vital Signs ED Triage Vitals  Enc Vitals Group     BP 01/26/22 1809 (!) 171/94     Pulse Rate 01/26/22 1809 86     Resp 01/26/22 1809 12     Temp 01/26/22 1809 98.8 F (37.1 C)     Temp Source 01/26/22 1809 Oral     SpO2 01/26/22 1809 99 %     Weight 01/26/22 1813 (!) 345 lb (156.5 kg)     Height 01/26/22 1813 6\' 8"  (2.032 m)     Head Circumference --      Peak Flow --      Pain Score 01/26/22 1812 2     Pain Loc --      Pain Edu? --      Excl. in [place]? --    No data  found.  Updated Vital Signs BP (!) 171/94 (BP Location: Right Arm)   Pulse 86   Temp 98.8 F (37.1 C) (Oral)   Resp 12   Ht 6\' 8"  (2.032 m)   Wt (!) 345 lb (156.5 kg)   SpO2 99%   BMI 37.90 kg/m   Visual Acuity Right Eye Distance: 2025 (with glasses) Left Eye Distance: 20/25 (with glasses) Bilateral Distance: 20/20 (with glasses)      Physical Exam Vitals and nursing note reviewed.  Constitutional:      General: He is not in acute distress.    Appearance: Normal appearance. He is obese. He is not ill-appearing.  HENT:     Head: Normocephalic and atraumatic.     Mouth/Throat:     Mouth: Mucous membranes are moist.     Pharynx: Oropharynx is clear.  Eyes:     Extraocular Movements: Extraocular movements intact.     Conjunctiva/sclera: Conjunctivae normal.     Pupils: Pupils are equal, round, and reactive to light.     Comments: Left eye: Moderate soft tissue swelling of upper lower eyelid, sclera with +3 injection mucopurulent discharge at inner/outer canthus  Cardiovascular:     Rate and Rhythm: Normal rate and regular rhythm.     Pulses: Normal pulses.     Heart sounds: Normal heart sounds. No murmur heard. Pulmonary:     Effort: Pulmonary effort is normal.     Breath sounds: Normal breath sounds. No wheezing, rhonchi or rales.  Musculoskeletal:        General: Normal range of motion.     Cervical back: Normal range of motion and neck supple.  Skin:    General: Skin is warm and dry.  Neurological:     General:  No focal deficit present.     Mental Status: He is alert and oriented to person, place, and time. Mental status is at baseline.      UC Treatments / Results  Labs (all labs ordered are listed, but only abnormal results are displayed) Labs Reviewed - No data to display  EKG   Radiology No results found.  Procedures Procedures (including critical care time)  Medications Ordered in UC Medications - No data to display  Initial Impression /  Assessment and Plan / UC Course  I have reviewed the triage vital signs and the nursing notes.  Pertinent labs & imaging results that were available during my care of the patient were reviewed by me and considered in my medical decision making (see chart for details).     MDM: 1. Swelling of left upper eyelid-Rx'd prednisone; 2.  Conjunctivitis of left eye, unspecified conjunctivitis type-Rx'd Vigamox. Advised patient to take medication as directed with food to completion.  Advised patient to instill Vigamox eyedrop as directed.  Advised patient if right eye develops similar symptoms may treat right eye with Vigamox as well.  Advised if symptoms worsen and/or unresolved please follow-up with optometry/ophthalmology.  Discharged home, hemodynamically stable. Final Clinical Impressions(s) / UC Diagnoses   Final diagnoses:  Swelling of left upper eyelid  Conjunctivitis of left eye, unspecified conjunctivitis type     Discharge Instructions      Advised patient to take medication as directed with food to completion.  Advised patient to instill Vigamox eyedrop as directed.  Advised patient if right eye develops similar symptoms may treat right eye with Vigamox as well.  Advised if symptoms worsen and/or unresolved please follow-up with optometry/ophthalmology.     ED Prescriptions     Medication Sig Dispense Auth. Provider   predniSONE (DELTASONE) 10 MG tablet Take 2 tablets (20 mg total) by mouth daily. 15 tablet Eliezer Lofts, FNP   moxifloxacin (VIGAMOX) 0.5 % ophthalmic solution Place 1 drop into the left eye 3 (three) times daily for 7 days. 3 mL Eliezer Lofts, FNP   moxifloxacin (VIGAMOX) 0.5 % ophthalmic solution Place 1 drop into the left eye 3 (three) times daily for 7 days. 3 mL Eliezer Lofts, FNP   predniSONE (DELTASONE) 20 MG tablet Take 3 tabs PO daily x 5 days. 15 tablet Eliezer Lofts, FNP      PDMP not reviewed this encounter.   Eliezer Lofts, [place] 01/26/22 1907

## 2022-01-26 NOTE — ED Triage Notes (Signed)
Pt left eye is swollen , red and drainage x2day causing some itchiness.

## 2022-01-26 NOTE — Discharge Instructions (Addendum)
Advised patient to take medication as directed with food to completion.  Advised patient to instill Vigamox eyedrop as directed.  Advised patient if right eye develops similar symptoms may treat right eye with Vigamox as well.  Advised if symptoms worsen and/or unresolved please follow-up with optometry/ophthalmology.

## 2022-01-27 ENCOUNTER — Other Ambulatory Visit (HOSPITAL_COMMUNITY): Payer: 59

## 2022-01-28 ENCOUNTER — Other Ambulatory Visit (HOSPITAL_COMMUNITY): Payer: 59

## 2022-02-03 ENCOUNTER — Ambulatory Visit (INDEPENDENT_AMBULATORY_CARE_PROVIDER_SITE_OTHER): Payer: 59 | Admitting: Family

## 2022-02-03 ENCOUNTER — Ambulatory Visit (HOSPITAL_COMMUNITY)
Admission: RE | Admit: 2022-02-03 | Discharge: 2022-02-03 | Disposition: A | Discharge status: Home / Self Care | Payer: 59 | Source: Ambulatory Visit | Attending: Cardiology | Admitting: Cardiology

## 2022-02-03 ENCOUNTER — Encounter: Payer: Self-pay | Admitting: Family

## 2022-02-03 DIAGNOSIS — I5022 Chronic systolic (congestive) heart failure: Secondary | ICD-10-CM | POA: Diagnosis present

## 2022-02-03 DIAGNOSIS — Z9889 Other specified postprocedural states: Secondary | ICD-10-CM

## 2022-02-03 LAB — TSH: TSH: <0.01 u[IU]/mL — ABNORMAL LOW (ref 0.350–4.500)

## 2022-02-03 LAB — T4, FREE: Free T4: 2.5 ng/dL — ABNORMAL HIGH (ref 0.61–1.12)

## 2022-02-03 NOTE — Progress Notes (Signed)
Post-Op Visit Note   Patient: Randy [name]           Date of Birth: Jun 13, 1970           MRN: [id number] Visit Date: 02/03/2022 PCP: Pcp, No  Chief Complaint:  Chief Complaint  Patient presents with   Right Foot - Follow-up    10/10/2021 right foot debridement  In office donation kerecis 12/24/2021 and 01/07/22    HPI:  HPI The patient is a 51 year old gentleman who is seen today in follow-up he is status post right foot debridement on June 16.  He had an office Kerecis placement of donated sheet on August 30 as well as 01/07/22.  Ortho Exam On examination of the right foot this continues to improve.  There is reduced edema there is no surrounding erythema no warmth no cellulitis the ulcer continues to look improved. Debrided of nonviable and fibrinous tissue with gauze.  Again donated Kerecis sheet applied to his wound bed which is now 5 cm x 1 cm. With the same wound care instructions as the time of last in office graft placement.  Visit Diagnoses: No diagnosis found.  Plan: donated kerecis sheet applied. He will change the overlying gauze once a day.  In 1 week he will remove the entire dressing and begin daily Dial soap cleansing.  Dry dressings daily.  Follow-up in 2 weeks.  Will return to work tomorrow.  Follow-Up Instructions: No follow-ups on file.   Imaging: No results found.  Orders:  No orders of the defined types were placed in this encounter.  No orders of the defined types were placed in this encounter.    PMFS History: Patient Active Problem List   Diagnosis Date Noted   Osteomyelitis of great toe of right foot ([place])    Cutaneous abscess of right foot    Cellulitis of right foot 09/15/2021   Leg pain, medial, right 09/14/2021   Permanent atrial fibrillation ([place]) 01/27/2021   SIRS (systemic inflammatory response syndrome) ([place]) 01/27/2021   HTN (hypertension) 01/27/2021   Hypokalemia 40/98/1191   Acute systolic CHF (congestive heart failure) ([place])  01/27/2021   New onset of congestive heart failure ([place]) 01/26/2021   Acute CVA (cerebrovascular accident) ([place]) 08/07/2020   Acute upper respiratory infection 11/27/2014   Numbness of finger 09/19/2011   Hilar adenopathy    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[place]) 47/82/9562   Type 2 diabetes mellitus with complication, without long-term current use of insulin ([place]) 04/23/2010   Renal insufficiency 03/21/2008   VISUAL ACUITY, DECREASED, LEFT EYE 03/19/2008   BELL'S PALSY, LEFT 01/30/2008   Morbid obesity ([place]) 01/20/2008   Malignant hypertension with heart failure and stage 3 chronic kidney disease ([place]) 01/20/2008   Past Medical History:  Diagnosis Date   CKD (chronic kidney disease) stage 3, GFR 30-59 ml/min (HCC)    baseline Cr 1.5-1.7   Decreased visual acuity    Left eye, resolved - hypertensive retinopathy   Diabetes mellitus 2012   Type II   Hilar adenopathy    on CT scan 02/2010, on rpt scan stable/improved.   History of Bell's palsy 12/2007   history, Left   History of headache    HTN (hypertension), malignant    previously on BC and goody powders for HA   Internal derangement of knee 12/2007   Left   Microalbuminuria    Morbid obesity ([place])    Stroke ([place])    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[place])    echo 2011 with nonischemic hypertensive cardiomyopathy  Systolic murmur    Vitamin D deficiency     Family History  Problem Relation Age of Onset   Hypertension Mother    Hypertension Father    Diabetes Father    Hypertension Brother    Diabetes Sister    Coronary artery disease Neg Hx    Stroke Neg Hx    Cancer Neg Hx    Kidney disease Paternal Grandmother        ESRD    Past Surgical History:  Procedure Laterality Date   AMPUTATION Right 09/17/2021   Procedure: First ray amputation;  Surgeon: Newt Minion, MD;  Location: [facility];  Service: Orthopedics;  Laterality: Right;   APPLICATION OF WOUND VAC Right 10/10/2021   Procedure: APPLICATION OF WOUND VAC;  Surgeon: Newt Minion, MD;  Location:  [facility];  Service: Orthopedics;  Laterality: Right;   CARDIOVERSION N/A 01/31/2021   Procedure: CARDIOVERSION;  Surgeon: Jolaine Artist, MD;  Location: [facility];  Service: Cardiovascular;  Laterality: N/A;   CARDIOVERSION N/A 07/04/2021   Procedure: CARDIOVERSION;  Surgeon: Jolaine Artist, MD;  Location: [facility] ENDOSCOPY;  Service: Cardiovascular;  Laterality: N/A;   hospitalization  11/2008   malignant HTN, nl SPEP/UPEP, neg ANCA panel, nl C3/4, neg anti GBM Ab, neg Hep A/B/C, nl renal US, nl PTH, neg HIV   I & D EXTREMITY Right 10/10/2021   Procedure: RIGHT FOOT DEBRIDEMENT;  Surgeon: Newt Minion, MD;  Location: [facility];  Service: Orthopedics;  Laterality: Right;   RIGHT/LEFT HEART CATH AND CORONARY ANGIOGRAPHY N/A 01/29/2021   Procedure: RIGHT/LEFT HEART CATH AND CORONARY ANGIOGRAPHY;  Surgeon: Jolaine Artist, MD;  Location: [facility] CV LAB;  Service: Cardiovascular;  Laterality: N/A;   TEE WITHOUT CARDIOVERSION N/A 01/31/2021   Procedure: TRANSESOPHAGEAL ECHOCARDIOGRAM (TEE);  Surgeon: Jolaine Artist, MD;  Location: [facility];  Service: Cardiovascular;  Laterality: N/A;   US ECHOCARDIOGRAPHY  11/2008   LVsys fxn EF 50%, mild MR, normal LV size, neg ANA, neg cryoglobulins   US ECHOCARDIOGRAPHY  2011   severe LVH, EF 40%, LA mildly dilated, PA pressure moderately increased   Social History   Occupational History   Occupation: Tax Forensic psychologist    Comment: [employer]  Tobacco Use   Smoking status: Former    Types: Cigars   Smokeless tobacco: Never   Tobacco comments:    monthly, when going out  Media planner   Vaping Use: Never used  Substance and Sexual Activity   Alcohol use: Yes    Comment: Occasional   Drug use: No   Sexual activity: Not on file

## 2022-02-04 ENCOUNTER — Telehealth (HOSPITAL_COMMUNITY): Payer: Self-pay

## 2022-02-04 DIAGNOSIS — E118 Type 2 diabetes mellitus with unspecified complications: Secondary | ICD-10-CM

## 2022-02-04 DIAGNOSIS — E0590 Thyrotoxicosis, unspecified without thyrotoxic crisis or storm: Secondary | ICD-10-CM

## 2022-02-04 LAB — T3, FREE: T3, Free: 2 pg/mL (ref 2.0–4.4)

## 2022-02-04 NOTE — Telephone Encounter (Signed)
Randy [name], CMA  02/04/2022  9:03 AM EDT Back to Top    Patient advised and verbalized understanding. Referral placed.   Kerry Dory, [facility]  02/03/2022  4:35 PM EDT     Patient called.  Left message for patient to call back.Phone:  [phone] Jerilynn Mages)   [place], [place]  02/03/2022  2:05 PM EDT     TSH is very low and free T4 elevated, indicating hyperthyroidism. Please make sure he has stopped his amiodarone.   He needs PCP appt asap to discuss next steps (likely needs methimazole). Please refer to Endocrinology as well.    Orders Placed This Encounter  Procedures   Ambulatory referral to Endocrinology    Referral Priority:   Routine    Referral Type:   Consultation    Referral Reason:   Specialty Services Required    Number of Visits Requested:   1   Ambulatory Referral to Primary Care    Referral Priority:   Urgent    Referral Type:   Consultation    Referral Reason:   Specialty Services Required    Number of Visits Requested:   1

## 2022-02-10 ENCOUNTER — Telehealth: Payer: Self-pay | Admitting: Pharmacist

## 2022-02-10 NOTE — Telephone Encounter (Signed)
Called pt to see how he was doing on [place]. LVM for pt to call back

## 2022-02-11 MED ORDER — MOUNJARO 10 MG/0.5ML ~~LOC~~ SOAJ: 10.0000 mg | SUBCUTANEOUS | 11 refills | Status: DC

## 2022-02-11 NOTE — Telephone Encounter (Signed)
Patient doing well on Mounjaro 7.5mg . Would like to increase to 10mg  and stay there. Will call in Rx for 10mg  and follow up with patient in 4 weeks. (Still has 2 weeks of 7.5mg  left)

## 2022-03-02 ENCOUNTER — Other Ambulatory Visit (HOSPITAL_COMMUNITY): Payer: Self-pay | Admitting: *Deleted

## 2022-03-02 MED ORDER — APIXABAN 5 MG PO TABS: 5.0000 mg | ORAL_TABLET | Freq: Two times a day (BID) | ORAL | 3 refills | Status: DC

## 2022-03-03 ENCOUNTER — Other Ambulatory Visit (HOSPITAL_COMMUNITY): Payer: Self-pay | Admitting: *Deleted

## 2022-03-03 ENCOUNTER — Telehealth (HOSPITAL_COMMUNITY): Payer: Self-pay | Admitting: *Deleted

## 2022-03-03 MED ORDER — FUROSEMIDE 20 MG PO TABS: 20.0000 mg | ORAL_TABLET | Freq: Every day | ORAL | 3 refills | Status: DC | PRN

## 2022-03-03 NOTE — Telephone Encounter (Signed)
Pt aware and lasix sent to pharmacy

## 2022-03-03 NOTE — Telephone Encounter (Signed)
Pt said he is no longer taking lasix but oticed he has some swelling and wanted to know if he could have a prn script.   Routed to [facility]

## 2022-03-07 ENCOUNTER — Encounter (HOSPITAL_COMMUNITY): Payer: Self-pay | Admitting: *Deleted

## 2022-03-07 ENCOUNTER — Ambulatory Visit (HOSPITAL_COMMUNITY)
Admission: EM | Admit: 2022-03-07 | Discharge: 2022-03-07 | Disposition: A | Discharge status: Home / Self Care | Payer: 59 | Attending: Physician Assistant | Admitting: Physician Assistant

## 2022-03-07 DIAGNOSIS — W57XXXA Bitten or stung by nonvenomous insect and other nonvenomous arthropods, initial encounter: Secondary | ICD-10-CM | POA: Diagnosis not present

## 2022-03-07 DIAGNOSIS — S60561A Insect bite (nonvenomous) of right hand, initial encounter: Secondary | ICD-10-CM

## 2022-03-07 DIAGNOSIS — L03113 Cellulitis of right upper limb: Secondary | ICD-10-CM

## 2022-03-07 MED ORDER — KETOROLAC TROMETHAMINE 30 MG/ML IJ SOLN
INTRAMUSCULAR | Status: AC
  Filled 2022-03-07: qty 1

## 2022-03-07 MED ORDER — KETOROLAC TROMETHAMINE 30 MG/ML IJ SOLN
30.0000 mg | Freq: Once | INTRAMUSCULAR | Status: AC
  Administered 2022-03-07: 30 mg via INTRAMUSCULAR

## 2022-03-07 MED ORDER — CEFTRIAXONE SODIUM 500 MG IJ SOLR
INTRAMUSCULAR | Status: AC
  Filled 2022-03-07: qty 500

## 2022-03-07 MED ORDER — SULFAMETHOXAZOLE-TRIMETHOPRIM 800-160 MG PO TABS: 1.0000 | ORAL_TABLET | Freq: Two times a day (BID) | ORAL | 0 refills | Status: AC

## 2022-03-07 MED ORDER — IBUPROFEN 600 MG PO TABS: 600.0000 mg | ORAL_TABLET | Freq: Four times a day (QID) | ORAL | 0 refills | Status: DC | PRN

## 2022-03-07 MED ORDER — CEFTRIAXONE SODIUM 500 MG IJ SOLR
500.0000 mg | INTRAMUSCULAR | Status: DC
  Administered 2022-03-07: 500 mg via INTRAMUSCULAR

## 2022-03-07 NOTE — ED Triage Notes (Signed)
Pt states his right hand is swelling and hurting X 2 days. He doesn't recall doing anything to cause it. He has been icing and elevating.

## 2022-03-07 NOTE — Discharge Instructions (Signed)
Advance take the Bactrim DS, 1 tablet every 12 hours starting today. Advised take ibuprofen 600 mg every 6 hours with food on a regular basis to help reduce pain and swelling. Advised to watch the swelling and redness of the hand, if symptoms progress over the next 5 days instead of improving or pain increases then return to urgent care for further evaluation.

## 2022-03-07 NOTE — ED Provider Notes (Signed)
[facility]    CSN: [id number] Arrival date & time: 03/07/22  1353      History   Chief Complaint Chief Complaint  Patient presents with   Hand Pain    HPI Randy [name] is a 51 y.o. male.   51 year old male presents with right hand pain and swelling.  Patient indicates for the past 2 days he has been having right hand pain, swelling, and redness.  Patient indicates that he got bit by an insect on the right index finger.  He relates he thinks this occurred 2 days ago and then he started noticing redness and swelling starting develop at the bite site which has progressed to the hand being swollen.  Patient indicates that he gets pain and discomfort with opening and closing his hand.  Patient relates being a diabetic and relates that his glucose levels have been controlled last time he checked his glucose was this morning and his level was 67.  Patient denies fever, chills, nausea or vomiting.   Hand Pain    Past Medical History:  Diagnosis Date   CKD (chronic kidney disease) stage 3, GFR 30-59 ml/min (HCC)    baseline Cr 1.5-1.7   Decreased visual acuity    Left eye, resolved - hypertensive retinopathy   Diabetes mellitus 2012   Type II   Hilar adenopathy    on CT scan 02/2010, on rpt scan stable/improved.   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   Systolic murmur    Vitamin D deficiency     Patient Active Problem List   Diagnosis Date Noted   Osteomyelitis of great toe of right foot ([place])    Cutaneous abscess of right foot    Cellulitis of right foot 09/15/2021   Leg pain, medial, right 09/14/2021   Permanent atrial fibrillation ([place]) 01/27/2021   SIRS  (systemic inflammatory response syndrome) ([place]) 01/27/2021   HTN (hypertension) 01/27/2021   Hypokalemia 00/37/0488   Acute systolic CHF (congestive heart failure) ([employer]) 01/27/2021   New onset of congestive heart failure ([place]) 01/26/2021   Acute CVA (cerebrovascular accident) ([place]) 08/07/2020   Acute upper respiratory infection 11/27/2014   Numbness of finger 09/19/2011   Hilar adenopathy    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[place]) 89/16/9450   Type 2 diabetes mellitus with complication, without long-term current use of insulin ([place]) 04/23/2010   Renal insufficiency 03/21/2008   VISUAL ACUITY, DECREASED, LEFT EYE 03/19/2008   BELL'S PALSY, LEFT 01/30/2008   Morbid obesity ([place]) 01/20/2008   Malignant hypertension with heart failure and stage 3 chronic kidney disease ([place]) 01/20/2008    Past Surgical History:  Procedure Laterality Date   AMPUTATION Right 09/17/2021   Procedure: First ray amputation;  Surgeon: Newt Minion, MD;  Location: [facility];  Service: Orthopedics;  Laterality: Right;   APPLICATION OF WOUND VAC Right 10/10/2021   Procedure: APPLICATION OF WOUND VAC;  Surgeon: Newt Minion, MD;  Location: [facility];  Service: Orthopedics;  Laterality: Right;   CARDIOVERSION N/A 01/31/2021   Procedure: CARDIOVERSION;  Surgeon: Jolaine Artist, MD;  Location: [facility];  Service: Cardiovascular;  Laterality: N/A;  CARDIOVERSION N/A 07/04/2021   Procedure: CARDIOVERSION;  Surgeon: Jolaine Artist, MD;  Location: [facility] ENDOSCOPY;  Service: Cardiovascular;  Laterality: N/A;   hospitalization  11/2008   malignant HTN, nl SPEP/UPEP, neg ANCA panel, nl C3/4, neg anti GBM Ab, neg Hep A/B/C, nl renal US, nl PTH, neg HIV   I & D EXTREMITY Right 10/10/2021   Procedure: RIGHT FOOT DEBRIDEMENT;  Surgeon: Newt Minion, MD;  Location: [facility];  Service: Orthopedics;  Laterality: Right;   RIGHT/LEFT HEART CATH AND CORONARY ANGIOGRAPHY N/A 01/29/2021   Procedure: RIGHT/LEFT HEART CATH AND CORONARY ANGIOGRAPHY;  Surgeon:  Jolaine Artist, MD;  Location: [facility] CV LAB;  Service: Cardiovascular;  Laterality: N/A;   TEE WITHOUT CARDIOVERSION N/A 01/31/2021   Procedure: TRANSESOPHAGEAL ECHOCARDIOGRAM (TEE);  Surgeon: Jolaine Artist, MD;  Location: [facility] ENDOSCOPY;  Service: Cardiovascular;  Laterality: N/A;   US ECHOCARDIOGRAPHY  11/2008   LVsys fxn EF 50%, mild MR, normal LV size, neg ANA, neg cryoglobulins   US ECHOCARDIOGRAPHY  2011   severe LVH, EF 40%, LA mildly dilated, PA pressure moderately increased       Home Medications    Prior to Admission medications   Medication Sig Start Date End Date Taking? Authorizing Provider  apixaban (ELIQUIS) 5 MG TABS tablet Take 1 tablet (5 mg total) by mouth 2 (two) times daily. 03/02/22  Yes Clegg, Amy D, NP  ascorbic acid (VITAMIN C) 1000 MG tablet Take 1 tablet (1,000 mg total) by mouth daily. 09/20/21  Yes Orvis Brill, MD  atorvastatin (LIPITOR) 20 MG tablet Take 1 tablet (20 mg total) by mouth at bedtime. 01/15/22  Yes Bensimhon, Shaune Pascal, MD  ENTRESTO 97-103 MG Take 1 tablet by mouth twice daily 01/21/22  Yes Clegg, Amy D, NP  furosemide (LASIX) 20 MG tablet Take 1 tablet (20 mg total) by mouth daily as needed for fluid or edema. 03/03/22   [place], Maricela Bo, FNP  ibuprofen (ADVIL) 600 MG tablet Take 1 tablet (600 mg total) by mouth every 6 (six) hours as needed. 03/07/22  Yes Nyoka Lint, PA-C  isosorbide-hydrALAZINE (BIDIL) 20-37.5 MG tablet Take 2 tablets by mouth 3 (three) times daily.   Yes [provider]  JARDIANCE 10 MG TABS tablet Take 1 tablet by mouth once daily 12/08/21  Yes Clegg, Amy D, NP  nutrition supplement, JUVEN, (JUVEN) PACK Take 1 packet by mouth 2 (two) times daily between meals. 09/19/21  Yes Orvis Brill, MD  sulfamethoxazole-trimethoprim (BACTRIM DS) 800-160 MG tablet Take 1 tablet by mouth 2 (two) times daily for 7 days. 03/07/22 03/14/22 Yes Nyoka Lint, PA-C  tirzepatide [facility]) 10 MG/0.5ML Pen Inject 10  mg into the skin once a week. 02/11/22  Yes Bensimhon, Shaune Pascal, MD  acetaminophen (TYLENOL) 500 MG tablet Take 500 mg by mouth every 6 (six) hours as needed (pain.).    [provider]  docusate sodium (COLACE) 100 MG capsule Take 1 capsule (100 mg total) by mouth daily. 09/20/21   Orvis Brill, MD  ferrous gluconate (FERGON) 324 MG tablet Take 1 tablet (324 mg total) by mouth daily with breakfast. 09/19/21   Orvis Brill, MD  metoprolol succinate (TOPROL-XL) 100 MG 24 hr tablet TAKE 1 TABLET BY MOUTH ONCE DAILY (TAKE  WITH  OR  IMMEDIATELY  FOLLOWING  A  MEAL) 11/04/21   Bensimhon, Shaune Pascal, MD  oxyCODONE-acetaminophen (PERCOCET/ROXICET) 5-325 MG tablet Take 1 tablet by mouth every 4 (four) hours as needed. 10/10/21  Nadara Mustard, MD  pantoprazole (PROTONIX) 40 MG tablet Take 1 tablet (40 mg total) by mouth daily. 09/20/21   Andrey Campanile, MD  predniSONE (DELTASONE) 10 MG tablet Take 2 tablets (20 mg total) by mouth daily. 01/26/22   Trevor Iha, FNP  predniSONE (DELTASONE) 20 MG tablet Take 3 tabs PO daily x 5 days. 01/26/22   Trevor Iha, FNP  spironolactone (ALDACTONE) 25 MG tablet Take 1 tablet (25 mg total) by mouth daily. 04/16/21 11/21/21  Clegg, Amy D, NP  Zinc Sulfate 220 (50 Zn) MG TABS Take 1 tablet (220 mg total) by mouth daily. 09/20/21   Andrey Campanile, MD    Family History Family History  Problem Relation Age of Onset   Hypertension Mother    Hypertension Father    Diabetes Father    Diabetes Sister    Hypertension Brother    Kidney disease Paternal Grandmother        ESRD   Coronary artery disease Neg Hx    Stroke Neg Hx    Cancer Neg Hx     Social History Social History   Tobacco Use   Smoking status: Former    Types: Cigars   Smokeless tobacco: Never   Tobacco comments:    monthly, when going out  Vaping Use   Vaping Use: Never used  Substance Use Topics   Alcohol use: Yes    Comment: Occasional   Drug use: No      Allergies   Fish-derived products   Review of Systems Review of Systems  Skin:  Positive for wound (right hand insect bite with redness and swelling).     Physical Exam Triage Vital Signs ED Triage Vitals  Enc Vitals Group     BP 03/07/22 1456 (!) 165/107     Pulse Rate 03/07/22 1456 93     Resp 03/07/22 1456 18     Temp 03/07/22 1456 98.2 F (36.8 C)     Temp Source 03/07/22 1456 Oral     SpO2 03/07/22 1456 98 %     Weight --      Height --      Head Circumference --      Peak Flow --      Pain Score 03/07/22 1454 9     Pain Loc --      Pain Edu? --      Excl. in GC? --    No data found.  Updated Vital Signs BP (!) 165/107 (BP Location: Left Arm)   Pulse 93   Temp 98.2 F (36.8 C) (Oral)   Resp 18   SpO2 98%   Visual Acuity Right Eye Distance:   Left Eye Distance:   Bilateral Distance:    Right Eye Near:   Left Eye Near:    Bilateral Near:     Physical Exam Constitutional:      Appearance: Normal appearance.  Skin:         Comments: Right hand: There is an insect bite puncture wound without drainage that is on the posterior aspect of the first digit between the MCP and the PIP joint.  There is 2+ swelling of the hand and the first 2 fingers, with associated redness.  There is a mild red streak that runs to 2 inches above the wrist area.  Range of motion is normal, flexion extension is normal.  Neurological:     Mental Status: He is alert.      UC Treatments /  Results  Labs (all labs ordered are listed, but only abnormal results are displayed) Labs Reviewed - No data to display  EKG   Radiology No results found.  Procedures Procedures (including critical care time)  Medications Ordered in UC Medications  ketorolac (TORADOL) 30 MG/ML injection 30 mg (has no administration in time range)  cefTRIAXone (ROCEPHIN) injection 500 mg (has no administration in time range)    Initial Impression / Assessment and Plan / UC Course  I have  reviewed the triage vital signs and the nursing notes.  Pertinent labs & imaging results that were available during my care of the patient were reviewed by me and considered in my medical decision making (see chart for details).    Plan: 1.  Cellulitis of the right hand to be treated with the following: A.  Rocephin 500 mg IM given in the office today. B.  Bactrim DS every 12 hours starting today to treat the infection. 2.  The insect bite of the right hand will be treated with the following: A.  Ibuprofen 600 mg every 6 hours with food on a regular basis to treat the swelling and pain. 3.  Patient advised to watch the infection and the redness and if it progresses up the forearm he is to return for reevaluation.  Final Clinical Impressions(s) / UC Diagnoses   Final diagnoses:  Insect bite of right hand, initial encounter  Cellulitis of right hand     Discharge Instructions      Advance take the Bactrim DS, 1 tablet every 12 hours starting today. Advised take ibuprofen 600 mg every 6 hours with food on a regular basis to help reduce pain and swelling. Advised to watch the swelling and redness of the hand, if symptoms progress over the next 5 days instead of improving or pain increases then return to urgent care for further evaluation.    ED Prescriptions     Medication Sig Dispense Auth. Provider   sulfamethoxazole-trimethoprim (BACTRIM DS) 800-160 MG tablet Take 1 tablet by mouth 2 (two) times daily for 7 days. 14 tablet Nyoka Lint, PA-C   ibuprofen (ADVIL) 600 MG tablet Take 1 tablet (600 mg total) by mouth every 6 (six) hours as needed. 30 tablet Nyoka Lint, PA-C      PDMP not reviewed this encounter.   Nyoka Lint, PA-C 03/07/22 1519

## 2022-03-11 NOTE — Telephone Encounter (Signed)
Called pt to see how he was doing on Mounjaro 10mg  weekly. LVM for pt to call back.

## 2022-03-12 MED ORDER — MOUNJARO 12.5 MG/0.5ML ~~LOC~~ SOAJ: 12.5000 mg | SUBCUTANEOUS | 0 refills | Status: DC

## 2022-03-12 NOTE — Telephone Encounter (Signed)
Patient returned call. He is doing well on Mounjaro 10mg . No side effects. He would like to increase to 12.5mg  weekly. Had surgery on his foot, so exercise has been limited, but still doing upper body weight lifting.  Will follow up in 3 weeks.

## 2022-03-12 NOTE — Addendum Note (Signed)
Addended by: Malena Peer D on: 03/12/2022 11:40 AM   Modules accepted: Orders

## 2022-03-24 ENCOUNTER — Encounter: Payer: Self-pay | Admitting: Family

## 2022-03-24 ENCOUNTER — Ambulatory Visit (INDEPENDENT_AMBULATORY_CARE_PROVIDER_SITE_OTHER): Payer: 59 | Admitting: Family

## 2022-03-24 DIAGNOSIS — Z9889 Other specified postprocedural states: Secondary | ICD-10-CM | POA: Diagnosis not present

## 2022-03-24 NOTE — Progress Notes (Signed)
Office Visit Note   Patient: Randy [name]           Date of Birth: January 31, 1971           MRN: [id number] Visit Date: 03/24/2022              Requested by: No referring provider defined for this encounter. PCP: Pcp, No  No chief complaint on file.     HPI: The patient is a 51 year old gentleman seen today in follow-up.  He is status post right foot debridement June 16 he has been slow to heal this wound he has had an office Kerecis placement of donated sheet as well as Kerecis powder on September 13   Has been doing dry dressing changes for the last 6 weeks Continues to have some swelling in his foot this is pain-free no fever no chills no warmth  Assessment & Plan: Visit Diagnoses: No diagnosis found.  Plan: Given Iodosorb he will continue with daily Dial soap cleansing and dressing changes daily compression garments recommended  Follow-Up Instructions: Return in about 6 weeks (around 05/05/2022).   Ortho Exam  Patient is alert, oriented, no adenopathy, well-dressed, normal affect, normal respiratory effort. On examination of the right foot his incision is well-healed he continues to have moderate edema of the foot without erythema or warmth  Ulcer now 3 cm in length 1 cm in width filled in with granulation after debridement.  Please see attached photo.  There is mild surrounding maceration   Imaging: No results found.   Labs: Lab Results  Component Value Date   HGBA1C 10.1 (H) 09/14/2021   HGBA1C 6.3 (H) 01/27/2021   HGBA1C 7.4 (H) 08/08/2020   ESRSEDRATE 3 08/07/2020   REPTSTATUS 09/19/2021 FINAL 09/14/2021   CULT  09/14/2021    NO GROWTH 5 DAYS Performed at [facility], [place] [address], [place], [place] [address]      Lab Results  Component Value Date   ALBUMIN 3.2 (L) 11/21/2021   ALBUMIN 2.7 (L) 09/15/2021   ALBUMIN 2.9 (L) 09/14/2021    Lab Results  Component Value Date   MG 2.2 09/17/2021   MG 1.8 01/30/2021   MG 1.8 01/26/2021   No  results found for: "VD25OH"  No results found for: "PREALBUMIN"    Latest Ref Rng & Units 11/21/2021    4:15 PM 09/19/2021    4:23 AM 09/18/2021    1:51 AM  CBC EXTENDED  WBC 4.0 - 10.5 K/uL 8.4  16.4  15.6   RBC 4.22 - 5.81 MIL/uL 4.46  4.31  4.55   Hemoglobin 13.0 - 17.0 g/dL 11.1  11.4  11.9   HCT 39.0 - 52.0 % 36.4  35.6  37.6   Platelets 150 - 400 K/uL 303  290  327      There is no height or weight on file to calculate BMI.  Orders:  No orders of the defined types were placed in this encounter.  No orders of the defined types were placed in this encounter.    Procedures: No procedures performed  Clinical Data: No additional findings.  ROS:  All other systems negative, except as noted in the HPI. Review of Systems  Objective: Vital Signs: There were no vitals taken for this visit.  Specialty Comments:  No specialty comments available.  PMFS History: Patient Active Problem List   Diagnosis Date Noted   Osteomyelitis of great toe of right foot ([place])    Cutaneous abscess of right  foot    Cellulitis of right foot 09/15/2021   Leg pain, medial, right 09/14/2021   Permanent atrial fibrillation ([place]) 01/27/2021   SIRS (systemic inflammatory response syndrome) ([place]) 01/27/2021   HTN (hypertension) 01/27/2021   Hypokalemia 60/73/7106   Acute systolic CHF (congestive heart failure) ([place]) 01/27/2021   New onset of congestive heart failure ([place]) 01/26/2021   Acute CVA (cerebrovascular accident) ([place]) 08/07/2020   Acute upper respiratory infection 11/27/2014   Numbness of finger 09/19/2011   Hilar adenopathy    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[place]) 26/94/8546   Type 2 diabetes mellitus with complication, without long-term current use of insulin ([place]) 04/23/2010   Renal insufficiency 03/21/2008   VISUAL ACUITY, DECREASED, LEFT EYE 03/19/2008   BELL'S PALSY, LEFT 01/30/2008   Morbid obesity ([place]) 01/20/2008   Malignant hypertension with heart failure and stage 3 chronic kidney  disease ([place]) 01/20/2008   Past Medical History:  Diagnosis Date   CKD (chronic kidney disease) stage 3, GFR 30-59 ml/min (HCC)    baseline Cr 1.5-1.7   Decreased visual acuity    Left eye, resolved - hypertensive retinopathy   Diabetes mellitus 2012   Type II   Hilar adenopathy    on CT scan 02/2010, on rpt scan stable/improved.   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   Systolic murmur    Vitamin D deficiency     Family History  Problem Relation Age of Onset   Hypertension Mother    Hypertension Father    Diabetes Father    Diabetes Sister    Hypertension Brother    Kidney disease Paternal Grandmother        ESRD   Coronary artery disease Neg Hx    Stroke Neg Hx    Cancer Neg Hx     Past Surgical History:  Procedure Laterality Date   AMPUTATION Right 09/17/2021   Procedure: First ray amputation;  Surgeon: Newt Minion, MD;  Location: [facility];  Service: Orthopedics;  Laterality: Right;   APPLICATION OF WOUND VAC Right 10/10/2021   Procedure: APPLICATION OF WOUND VAC;  Surgeon: Newt Minion, MD;  Location: [facility];  Service: Orthopedics;  Laterality: Right;   CARDIOVERSION N/A 01/31/2021   Procedure: CARDIOVERSION;  Surgeon: Jolaine Artist, MD;  Location: [facility];  Service: Cardiovascular;  Laterality: N/A;   CARDIOVERSION N/A 07/04/2021   Procedure: CARDIOVERSION;  Surgeon: Jolaine Artist, MD;  Location: [facility];  Service: Cardiovascular;  Laterality: N/A;   hospitalization  11/2008   malignant HTN, nl SPEP/UPEP, neg ANCA panel, nl C3/4, neg anti GBM Ab, neg Hep A/B/C, nl renal US, nl PTH, neg HIV   I & D EXTREMITY Right 10/10/2021   Procedure: RIGHT FOOT  DEBRIDEMENT;  Surgeon: Newt Minion, MD;  Location: [facility];  Service: Orthopedics;  Laterality: Right;   RIGHT/LEFT HEART CATH AND CORONARY ANGIOGRAPHY N/A 01/29/2021   Procedure: RIGHT/LEFT HEART CATH AND CORONARY ANGIOGRAPHY;  Surgeon: Jolaine Artist, MD;  Location: [facility];  Service: Cardiovascular;  Laterality: N/A;   TEE WITHOUT CARDIOVERSION N/A 01/31/2021   Procedure: TRANSESOPHAGEAL ECHOCARDIOGRAM (TEE);  Surgeon: Glori Bickers  R, MD;  Location: [facility];  Service: Cardiovascular;  Laterality: N/A;   US ECHOCARDIOGRAPHY  11/2008   LVsys fxn EF 50%, mild MR, normal LV size, neg ANA, neg cryoglobulins   US ECHOCARDIOGRAPHY  2011   severe LVH, EF 40%, LA mildly dilated, PA pressure moderately increased   Social History   Occupational History   Occupation: Tax Forensic psychologist    Comment: [employer]  Tobacco Use   Smoking status: Former    Types: Cigars   Smokeless tobacco: Never   Tobacco comments:    monthly, when going out  Media planner   Vaping Use: Never used  Substance and Sexual Activity   Alcohol use: Yes    Comment: Occasional   Drug use: No   Sexual activity: Not on file

## 2022-03-25 ENCOUNTER — Ambulatory Visit (HOSPITAL_COMMUNITY)
Admission: RE | Admit: 2022-03-25 | Discharge: 2022-03-25 | Disposition: A | Discharge status: Home / Self Care | Payer: 59 | Source: Ambulatory Visit | Attending: Internal Medicine | Admitting: Internal Medicine

## 2022-03-25 ENCOUNTER — Encounter (HOSPITAL_COMMUNITY): Payer: Self-pay | Admitting: Internal Medicine

## 2022-03-25 ENCOUNTER — Other Ambulatory Visit (HOSPITAL_COMMUNITY): Payer: Self-pay | Admitting: Internal Medicine

## 2022-03-25 ENCOUNTER — Inpatient Hospital Stay (HOSPITAL_COMMUNITY)
Admission: RE | Admit: 2022-03-25 | Discharge: 2022-03-25 | Disposition: A | Discharge status: Home / Self Care | Payer: 59 | Source: Ambulatory Visit | Attending: Internal Medicine | Admitting: Internal Medicine

## 2022-03-25 VITALS — BP 152/104 | HR 104 | Wt 364.2 lb

## 2022-03-25 DIAGNOSIS — Z7984 Long term (current) use of oral hypoglycemic drugs: Secondary | ICD-10-CM | POA: Diagnosis not present

## 2022-03-25 DIAGNOSIS — Z89411 Acquired absence of right great toe: Secondary | ICD-10-CM | POA: Insufficient documentation

## 2022-03-25 DIAGNOSIS — I428 Other cardiomyopathies: Secondary | ICD-10-CM | POA: Diagnosis not present

## 2022-03-25 DIAGNOSIS — I5022 Chronic systolic (congestive) heart failure: Secondary | ICD-10-CM | POA: Diagnosis not present

## 2022-03-25 DIAGNOSIS — G4733 Obstructive sleep apnea (adult) (pediatric): Secondary | ICD-10-CM | POA: Diagnosis not present

## 2022-03-25 DIAGNOSIS — I2510 Atherosclerotic heart disease of native coronary artery without angina pectoris: Secondary | ICD-10-CM | POA: Diagnosis not present

## 2022-03-25 DIAGNOSIS — Z79899 Other long term (current) drug therapy: Secondary | ICD-10-CM | POA: Insufficient documentation

## 2022-03-25 DIAGNOSIS — E1122 Type 2 diabetes mellitus with diabetic chronic kidney disease: Secondary | ICD-10-CM | POA: Insufficient documentation

## 2022-03-25 DIAGNOSIS — E669 Obesity, unspecified: Secondary | ICD-10-CM | POA: Insufficient documentation

## 2022-03-25 DIAGNOSIS — Z7901 Long term (current) use of anticoagulants: Secondary | ICD-10-CM | POA: Diagnosis not present

## 2022-03-25 DIAGNOSIS — Z8673 Personal history of transient ischemic attack (TIA), and cerebral infarction without residual deficits: Secondary | ICD-10-CM | POA: Insufficient documentation

## 2022-03-25 DIAGNOSIS — R002 Palpitations: Secondary | ICD-10-CM

## 2022-03-25 DIAGNOSIS — I480 Paroxysmal atrial fibrillation: Secondary | ICD-10-CM | POA: Diagnosis not present

## 2022-03-25 DIAGNOSIS — E0590 Thyrotoxicosis, unspecified without thyrotoxic crisis or storm: Secondary | ICD-10-CM | POA: Diagnosis not present

## 2022-03-25 DIAGNOSIS — I130 Hypertensive heart and chronic kidney disease with heart failure and stage 1 through stage 4 chronic kidney disease, or unspecified chronic kidney disease: Secondary | ICD-10-CM | POA: Diagnosis present

## 2022-03-25 DIAGNOSIS — N1832 Chronic kidney disease, stage 3b: Secondary | ICD-10-CM | POA: Insufficient documentation

## 2022-03-25 DIAGNOSIS — I5020 Unspecified systolic (congestive) heart failure: Secondary | ICD-10-CM | POA: Insufficient documentation

## 2022-03-25 DIAGNOSIS — E785 Hyperlipidemia, unspecified: Secondary | ICD-10-CM | POA: Insufficient documentation

## 2022-03-25 DIAGNOSIS — Z6841 Body Mass Index (BMI) 40.0 and over, adult: Secondary | ICD-10-CM | POA: Insufficient documentation

## 2022-03-25 DIAGNOSIS — I10 Essential (primary) hypertension: Secondary | ICD-10-CM

## 2022-03-25 LAB — COMPREHENSIVE METABOLIC PANEL
ALT: 29 U/L (ref 0–44)
AST: 22 U/L (ref 15–41)
Albumin: 3.2 g/dL — ABNORMAL LOW (ref 3.5–5.0)
Alkaline Phosphatase: 100 U/L (ref 38–126)
Anion gap: 9 (ref 5–15)
BUN: 21 mg/dL — ABNORMAL HIGH (ref 6–20)
CO2: 22 mmol/L (ref 22–32)
Calcium: 8.8 mg/dL — ABNORMAL LOW (ref 8.9–10.3)
Chloride: 107 mmol/L (ref 98–111)
Creatinine, Ser: 1.79 mg/dL — ABNORMAL HIGH (ref 0.61–1.24)
GFR, Estimated: 46 mL/min — ABNORMAL LOW (ref >60)
Glucose, Bld: 120 mg/dL — ABNORMAL HIGH (ref 70–99)
Potassium: 3.7 mmol/L (ref 3.5–5.1)
Sodium: 138 mmol/L (ref 135–145)
Total Bilirubin: 0.8 mg/dL (ref 0.3–1.2)
Total Protein: 7.4 g/dL (ref 6.5–8.1)

## 2022-03-25 LAB — CBC
HCT: 40.1 % (ref 39.0–52.0)
Hemoglobin: 12.4 g/dL — ABNORMAL LOW (ref 13.0–17.0)
MCH: 23.4 pg — ABNORMAL LOW (ref 26.0–34.0)
MCHC: 30.9 g/dL (ref 30.0–36.0)
MCV: 75.8 fL — ABNORMAL LOW (ref 80.0–100.0)
Platelets: 279 10*3/uL (ref 150–400)
RBC: 5.29 MIL/uL (ref 4.22–5.81)
RDW: 19.4 % — ABNORMAL HIGH (ref 11.5–15.5)
WBC: 11.9 10*3/uL — ABNORMAL HIGH (ref 4.0–10.5)
nRBC: 0 % (ref 0.0–0.2)

## 2022-03-25 LAB — TSH: TSH: <0.01 u[IU]/mL — ABNORMAL LOW (ref 0.350–4.500)

## 2022-03-25 LAB — BRAIN NATRIURETIC PEPTIDE: B Natriuretic Peptide: 497.5 pg/mL — ABNORMAL HIGH (ref 0.0–100.0)

## 2022-03-25 LAB — T4, FREE: Free T4: 2.89 ng/dL — ABNORMAL HIGH (ref 0.61–1.12)

## 2022-03-25 MED ORDER — AMLODIPINE BESYLATE 5 MG PO TABS: 5.0000 mg | ORAL_TABLET | Freq: Every day | ORAL | 3 refills | Status: DC

## 2022-03-25 NOTE — Progress Notes (Addendum)
Advanced Heart Failure Clinic Note  PCP: [facility]  Cardiologist: Dr. Gala Romney  HPI: Mr Carreno is a 51 year old with a history of HFrEF, paroxsymal A fib, HTN, hypertensive retinopathy, CKD Stage IIIa, hyperlipidemia, DMII, Bells palsy, CVA, and OSA.    Admitted in 12/02/2019 with newly diagnosed A fib + ischemic CVA. He did not meet criteria for IV tPA. MRI showed large acute focal infarct, left frontal lobe predominantly ACA distribution. CT angiogram of the head and neck performed revealing no significant carotid, vertebral or intracranial stenosis. Echocardiogram obtained with bubble study showing no cardiac etiology for stroke. LVEF was 40 to 45%.Placed on Toprol XL. Creatinine on 12/04/20 1.7.    Followed at [facility] for cardiology services. Saw Cyndia Diver on 12/12/2019. Set up for sleep study. Using CPAP 3 times a week.   Admitted 10/22 with A/C HFrEF and A fib RVR.  Echo showed EF down to 30-35% with speckled appears concerning for amyloid.Diuresed with IV lasix and placed on Amio drip. Once diuresed had TEE-DC-CV with restoration of NSR. Had RHC/LHC with min obs CAD, elevated filling pressures and normal cardiac output.  Diuresed 40 pounds. Discharge weight 367 pound.  Echo 3/23 EF 55% severe LVH.  Admitted 5/23 with osteomyelitis R great toe and cellulitis RLE. Placed on IV abx. S/p R first ray amputation + placement wound VAC. AHF consulted at patient's request, lasix stopped. Discharged home, weight 363 lbs. Wound later dehisced and underwent surgical debridement 6/23.   Returns for f/u. Feeling good. Foot has healed completely. Denies CP or SOB. No edema. On Mounjaro for weight loss. Remains on Eliquis for AF. No bleeding. Taking BP at home. Usually 130/75. Amiodarone stopped in 8/23 due to elevated thyroid tests. HR has been in 50s ever day until today. Now in 90s  Now back in AF.   Cardiac Testing - Echo (3/23): EF 55%, severe LVH  - Echo (10/22): EF 30-35%  specked appearance concerning for amyloid. RV normal. LA severely dilated. Severe LVH.   - Echo (8/21): EF 40-45% and severe LVH   - R/LHC (10/22)  Ao = 163/108 (134) LV = 136/25 RA =  15 RV = 51/13 PA = 57/19 (39) PCW = 28 Fick cardiac output/index = 9.2/3.0 PVR = 1.2 WU Ao sat = 94% PA sat = 65%, 65%    1. Mild non-obstructive CAD 2. NICM (likely hypertensive) with EF 35% by echo 3. Elevated filling pressures with normal CO 4. Severe systemic HTN  ROS: All systems negative except as listed in HPI, PMH and Problem List.  SH:  Social History   Socioeconomic History   Marital status: Married    Spouse name: Gregorio Worley   Number of children: 3   Years of education: Not on file   Highest education level: Bachelor's degree (e.g., BA, AB, BS)  Occupational History   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    Comment: [place]  Tobacco Use   Smoking status: Former    Types: Cigars   Smokeless tobacco: Never   Tobacco comments:    monthly, when going out  Vaping Use   Vaping Use: Never used  Substance and Sexual Activity   Alcohol use: Yes    Comment: Occasional   Drug use: No   Sexual activity: Not on file  Other Topics Concern   Not on file  Social History Narrative   Newly married, 2012, 1 daughter, 1 son   No injectable steroid cycles   Took oral  hormone pills, 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)   Regular exercise-yes   Social Determinants of Health   Financial Resource Strain: Low Risk  (01/27/2021)   Overall Financial Resource Strain (CARDIA)    Difficulty of Paying Living Expenses: Not hard at all  Food Insecurity: No Food Insecurity (02/11/2021)   Hunger Vital Sign    Worried About Running Out of Food in the Last Year: Never true    Ran Out of Food in the Last Year: Never true  Transportation Needs: No Transportation Needs (01/27/2021)   PRAPARE - Administrator, Civil Service (Medical): No    Lack of Transportation  (Non-Medical): No  Physical Activity: Not on file  Stress: Not on file  Social Connections: Not on file  Intimate Partner Violence: Not on file   FH:  Family History  Problem Relation Age of Onset   Hypertension Mother    Hypertension Father    Diabetes Father    Diabetes Sister    Hypertension Brother    Kidney disease Paternal Grandmother        ESRD   Coronary artery disease Neg Hx    Stroke Neg Hx    Cancer Neg Hx    Past Medical History:  Diagnosis Date   CKD (chronic kidney disease) stage 3, GFR 30-59 ml/min (HCC)    baseline Cr 1.5-1.7   Decreased visual acuity    Left eye, resolved - hypertensive retinopathy   Diabetes mellitus 2012   Type II   Hilar adenopathy    on CT scan 02/2010, on rpt scan stable/improved.   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   Systolic murmur    Vitamin D deficiency    Current Outpatient Medications  Medication Sig Dispense Refill   apixaban (ELIQUIS) 5 MG TABS tablet Take 1 tablet (5 mg total) by mouth 2 (two) times daily. 180 tablet 3   atorvastatin (LIPITOR) 20 MG tablet Take 1 tablet (20 mg total) by mouth at bedtime. 90 tablet 3   ENTRESTO 97-103 MG Take 1 tablet by mouth twice daily 60 tablet 11   furosemide (LASIX) 20 MG tablet Take 1 tablet (20 mg total) by mouth daily as needed for fluid or edema. 30 tablet 3   isosorbide-hydrALAZINE (BIDIL) 20-37.5 MG tablet Take 2 tablets by mouth 3 (three) times daily.     JARDIANCE 10 MG TABS tablet Take 1 tablet by mouth once daily 90 tablet 3   metoprolol succinate (TOPROL-XL) 100 MG 24 hr tablet TAKE 1 TABLET BY MOUTH ONCE DAILY (TAKE  WITH  OR  IMMEDIATELY  FOLLOWING  A  MEAL) 90 tablet 1    tirzepatide (MOUNJARO) 12.5 MG/0.5ML Pen Inject 12.5 mg into the skin once a week. 2 mL 0   No current facility-administered medications for this encounter.   BP (!) 152/104   Pulse (!) 104   Wt (!) 165.2 kg (364 lb 3.2 oz)   SpO2 97%   BMI 40.01 kg/m   Wt Readings from Last 3 Encounters:  03/25/22 (!) 165.2 kg (364 lb 3.2 oz)  01/26/22 (!) 156.5 kg (  345 lb)  12/03/21 (!) 166 kg (366 lb)   PHYSICAL EXAM: General:  Well appearing. No resp difficulty HEENT: normal Neck: supple. no JVD. Carotids 2+ bilat; no bruits. No lymphadenopathy or thryomegaly appreciated. Cor: PMI nondisplaced. Regular rate & rhythm. No rubs, gallops or murmurs. Lungs: clear Abdomen: soft, nontender, nondistended. No hepatosplenomegaly. No bruits or masses. Good bowel sounds. Extremities: no cyanosis, clubbing, rash, edema Neuro: alert & orientedx3, cranial nerves grossly intact. moves all 4 extremities w/o difficulty. Affect pleasant  ECG (personally reviewed): AF 99 No ST-T wave abnormalities. Personally reviewed  ASSESSMENT & PLAN:   1. 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 LHC w/ mild nonobstructive CAD. Suspect HTN cardiomyopathy.  - Echo 01/31/21 EF 30-35% with severe LVH. Doubt amyloid with uncontrolled hypertension however if EF drops again will need cMRI if he fits - Echo 3/23 EF 55-60% (recovered EF) - NYHA I-II. Volume ok  - Continue Entresto 97-103 mg bid. - Continue Jardiance 10 mg daily.  - Continue Bidil 2 tab tid.  - Continue Toprol XL 100 mg daily. - Continue spiro 25 mg daily. - Labs today.   2. PAF - S/P TEE/DC-CV (10/22) with restoration NSR  - Recurrent AF 3/23 s/p DCCV -.NSR on ECG 7/23 - Back in AF today (apple watch has shown HRs in 50s until today) after amio stopped in 823 due to hypothyroidism  - Continue Eliquis 5 mg bid. - Place zio to assess duration of AF - If AF persists with plan DC-CV and refer to EP for possible AF ablation vs Tikosyn  - Encouraged use  of CPAP   3. Hypertension  - BP mildly elevated - Add amlodipine 5 - Encouraged him to get back on CPAP    4. CKD Stage IIIb  - Baseline SCr 1.7-2.0 - Refer to Nephrology. - Labs today.   5. DMII - Poorly controlled, A1c 10.1  - He is not on insulin. - Continue Jardiance. No GU symptoms. - Needs PCP/Endocrinology. Given list of PCPs today.   6. OSA - On CPAP.    7. CAD - Mild nonobstructive on cath - No chest pain.  - c/w statin, LDL Goal < 70.  - Continue ? blocker. - No ASA w/ Eliquis.   8. S/p R great toe amputation - s/p R LE Osteomyelitis/R lower leg cellulitis, required Wound VAC 5/23. - Resolved  9. Obesity - Body mass index is 40.01 kg/m. - Now on Mounjaro   10. Hyperthyroidism - felt to be due to amio - Amio stopped - has Endo appt in July - Recheck thyroid today   Arvilla Meres, MD  3:09 PM

## 2022-03-25 NOTE — Patient Instructions (Addendum)
START Amlodipine 5 mg daily   Labs done today, your results will be available in MyChart, we will contact you for abnormal readings.   Your physician has requested that you have an echocardiogram. Echocardiography is a painless test that uses sound waves to create images of your heart. It provides your doctor with information about the size and shape of your heart and how well your heart's chambers and valves are working. This procedure takes approximately one hour. There are no restrictions for this procedure. Please do NOT wear cologne, perfume, aftershave, or lotions (deodorant is allowed). Please arrive 15 minutes prior to your appointment time.  Your provider has recommended that  you wear a Zio Patch for 14 days.  This monitor will record your heart rhythm for our review.  IF you have any symptoms while wearing the monitor please press the button.  If you have any issues with the patch or you notice a red or orange light on it please call the company at [phone].  Once you remove the patch please mail it back to the company as soon as possible so we can get the results.   Your physician recommends that you schedule a follow-up appointment in: 4 weeks with an echocardiogram  If you have any questions or concerns before your next appointment please send [place] a message through [place] or call our office at [phone].    TO LEAVE A MESSAGE FOR THE NURSE SELECT OPTION 2, PLEASE LEAVE A MESSAGE INCLUDING: YOUR NAME DATE OF BIRTH CALL BACK NUMBER REASON FOR CALL**this is important as we prioritize the call backs  YOU WILL RECEIVE A CALL BACK THE SAME DAY AS LONG AS YOU CALL BEFORE 4:00 PM  At the [facility], you and your health needs are our priority. As part of our continuing mission to provide you with exceptional heart care, we have created designated Provider Care Teams. These Care Teams include your primary Cardiologist (physician) and Advanced Practice Providers  (APPs- Physician Assistants and Nurse Practitioners) who all work together to provide you with the care you need, when you need it.   You may see any of the following providers on your designated Care Team at your next follow up: Dr Arvilla Meres Dr Marca Ancona Dr. Marcos Eke, NP Robbie Lis, [place] [facility] [place], [place] Brynda Peon, NP Karle Plumber, PharmD   Please be sure to bring in all your medications bottles to every appointment.

## 2022-03-27 LAB — T3, FREE: T3, Free: 3.4 pg/mL (ref 2.0–4.4)

## 2022-04-03 ENCOUNTER — Ambulatory Visit (HOSPITAL_COMMUNITY)
Admission: RE | Admit: 2022-04-03 | Discharge: 2022-04-03 | Disposition: A | Discharge status: Home / Self Care | Payer: 59 | Source: Ambulatory Visit | Attending: Internal Medicine | Admitting: Internal Medicine

## 2022-04-03 DIAGNOSIS — E1122 Type 2 diabetes mellitus with diabetic chronic kidney disease: Secondary | ICD-10-CM | POA: Diagnosis not present

## 2022-04-03 DIAGNOSIS — N183 Chronic kidney disease, stage 3 unspecified: Secondary | ICD-10-CM | POA: Insufficient documentation

## 2022-04-03 DIAGNOSIS — I130 Hypertensive heart and chronic kidney disease with heart failure and stage 1 through stage 4 chronic kidney disease, or unspecified chronic kidney disease: Secondary | ICD-10-CM | POA: Insufficient documentation

## 2022-04-03 DIAGNOSIS — I5022 Chronic systolic (congestive) heart failure: Secondary | ICD-10-CM | POA: Insufficient documentation

## 2022-04-03 LAB — ECHOCARDIOGRAM COMPLETE
AR max vel: 4.78 cm2
AV Area VTI: 4.19 cm2
AV Area mean vel: 4.44 cm2
AV Mean grad: 3 mmHg
AV Peak grad: 5.4 mmHg
Ao pk vel: 1.16 m/s
S' Lateral: 3.4 cm

## 2022-04-03 NOTE — Progress Notes (Incomplete)
  Echocardiogram 2D Echocardiogram has been performed.  Randy [name] 04/03/2022, 9:59 AM

## 2022-04-06 ENCOUNTER — Telehealth: Payer: Self-pay | Admitting: Pharmacist

## 2022-04-06 MED ORDER — MOUNJARO 15 MG/0.5ML ~~LOC~~ SOAJ: 15.0000 mg | SUBCUTANEOUS | 11 refills | Status: DC

## 2022-04-06 NOTE — Telephone Encounter (Signed)
Patient doing well on Mounjaro 12.5mg  weekly. Didn't notice much difference than with 10mg . Wants to increase to 15mg  weekly. Foot is healing well. Exercising. Doesn't have apt with [facility] until June. Advised to call and see if he could be put on a cancellation list for earlier apt. Will follow up with him in 4 weeks.

## 2022-04-14 ENCOUNTER — Telehealth (HOSPITAL_COMMUNITY): Payer: Self-pay

## 2022-04-14 ENCOUNTER — Other Ambulatory Visit (HOSPITAL_COMMUNITY): Payer: Self-pay

## 2022-04-14 DIAGNOSIS — E0590 Thyrotoxicosis, unspecified without thyrotoxic crisis or storm: Secondary | ICD-10-CM

## 2022-04-14 MED ORDER — PREDNISONE 10 MG PO TABS: 20.0000 mg | ORAL_TABLET | Freq: Every day | ORAL | 1 refills | Status: DC

## 2022-04-14 MED ORDER — METHIMAZOLE 5 MG PO TABS: 5.0000 mg | ORAL_TABLET | Freq: Two times a day (BID) | ORAL | 1 refills | Status: DC

## 2022-04-14 NOTE — Telephone Encounter (Signed)
Patient aware and agreeable. Referral to endo sent in and medications sent in

## 2022-04-22 ENCOUNTER — Ambulatory Visit (HOSPITAL_COMMUNITY)
Admission: RE | Admit: 2022-04-22 | Discharge: 2022-04-22 | Disposition: A | Discharge status: Home / Self Care | Payer: 59 | Source: Ambulatory Visit | Attending: Internal Medicine | Admitting: Internal Medicine

## 2022-04-22 VITALS — BP 150/90 | HR 106 | Wt 376.0 lb

## 2022-04-22 DIAGNOSIS — E1165 Type 2 diabetes mellitus with hyperglycemia: Secondary | ICD-10-CM | POA: Diagnosis not present

## 2022-04-22 DIAGNOSIS — Z8249 Family history of ischemic heart disease and other diseases of the circulatory system: Secondary | ICD-10-CM | POA: Diagnosis not present

## 2022-04-22 DIAGNOSIS — I2510 Atherosclerotic heart disease of native coronary artery without angina pectoris: Secondary | ICD-10-CM | POA: Insufficient documentation

## 2022-04-22 DIAGNOSIS — I10 Essential (primary) hypertension: Secondary | ICD-10-CM

## 2022-04-22 DIAGNOSIS — Z7984 Long term (current) use of oral hypoglycemic drugs: Secondary | ICD-10-CM | POA: Diagnosis not present

## 2022-04-22 DIAGNOSIS — Z6841 Body Mass Index (BMI) 40.0 and over, adult: Secondary | ICD-10-CM | POA: Diagnosis not present

## 2022-04-22 DIAGNOSIS — Z87891 Personal history of nicotine dependence: Secondary | ICD-10-CM | POA: Diagnosis not present

## 2022-04-22 DIAGNOSIS — Z79899 Other long term (current) drug therapy: Secondary | ICD-10-CM | POA: Diagnosis not present

## 2022-04-22 DIAGNOSIS — G4733 Obstructive sleep apnea (adult) (pediatric): Secondary | ICD-10-CM | POA: Insufficient documentation

## 2022-04-22 DIAGNOSIS — Z8673 Personal history of transient ischemic attack (TIA), and cerebral infarction without residual deficits: Secondary | ICD-10-CM | POA: Insufficient documentation

## 2022-04-22 DIAGNOSIS — E669 Obesity, unspecified: Secondary | ICD-10-CM | POA: Diagnosis not present

## 2022-04-22 DIAGNOSIS — I130 Hypertensive heart and chronic kidney disease with heart failure and stage 1 through stage 4 chronic kidney disease, or unspecified chronic kidney disease: Secondary | ICD-10-CM | POA: Insufficient documentation

## 2022-04-22 DIAGNOSIS — N1832 Chronic kidney disease, stage 3b: Secondary | ICD-10-CM | POA: Diagnosis not present

## 2022-04-22 DIAGNOSIS — I5022 Chronic systolic (congestive) heart failure: Secondary | ICD-10-CM | POA: Insufficient documentation

## 2022-04-22 DIAGNOSIS — Z7901 Long term (current) use of anticoagulants: Secondary | ICD-10-CM | POA: Diagnosis not present

## 2022-04-22 DIAGNOSIS — Z89411 Acquired absence of right great toe: Secondary | ICD-10-CM | POA: Insufficient documentation

## 2022-04-22 DIAGNOSIS — Z7952 Long term (current) use of systemic steroids: Secondary | ICD-10-CM | POA: Diagnosis not present

## 2022-04-22 DIAGNOSIS — I480 Paroxysmal atrial fibrillation: Secondary | ICD-10-CM | POA: Diagnosis not present

## 2022-04-22 DIAGNOSIS — E1122 Type 2 diabetes mellitus with diabetic chronic kidney disease: Secondary | ICD-10-CM | POA: Diagnosis not present

## 2022-04-22 DIAGNOSIS — Z833 Family history of diabetes mellitus: Secondary | ICD-10-CM | POA: Insufficient documentation

## 2022-04-22 DIAGNOSIS — I428 Other cardiomyopathies: Secondary | ICD-10-CM | POA: Insufficient documentation

## 2022-04-22 DIAGNOSIS — E0590 Thyrotoxicosis, unspecified without thyrotoxic crisis or storm: Secondary | ICD-10-CM | POA: Diagnosis not present

## 2022-04-22 DIAGNOSIS — I4821 Permanent atrial fibrillation: Secondary | ICD-10-CM

## 2022-04-22 LAB — COMPREHENSIVE METABOLIC PANEL
ALT: 34 U/L (ref 0–44)
AST: 22 U/L (ref 15–41)
Albumin: 3.4 g/dL — ABNORMAL LOW (ref 3.5–5.0)
Alkaline Phosphatase: 91 U/L (ref 38–126)
Anion gap: 6 (ref 5–15)
BUN: 30 mg/dL — ABNORMAL HIGH (ref 6–20)
CO2: 22 mmol/L (ref 22–32)
Calcium: 8.9 mg/dL (ref 8.9–10.3)
Chloride: 111 mmol/L (ref 98–111)
Creatinine, Ser: 1.64 mg/dL — ABNORMAL HIGH (ref 0.61–1.24)
GFR, Estimated: 51 mL/min — ABNORMAL LOW (ref >60)
Glucose, Bld: 91 mg/dL (ref 70–99)
Potassium: 3.8 mmol/L (ref 3.5–5.1)
Sodium: 139 mmol/L (ref 135–145)
Total Bilirubin: 0.5 mg/dL (ref 0.3–1.2)
Total Protein: 7 g/dL (ref 6.5–8.1)

## 2022-04-22 LAB — TSH: TSH: 0.01 u[IU]/mL — ABNORMAL LOW (ref 0.350–4.500)

## 2022-04-22 LAB — BRAIN NATRIURETIC PEPTIDE: B Natriuretic Peptide: 510.3 pg/mL — ABNORMAL HIGH (ref 0.0–100.0)

## 2022-04-22 LAB — T4, FREE: Free T4: 2.08 ng/dL — ABNORMAL HIGH (ref 0.61–1.12)

## 2022-04-22 MED ORDER — POTASSIUM CHLORIDE CRYS ER 20 MEQ PO TBCR: 40.0000 meq | EXTENDED_RELEASE_TABLET | Freq: Every day | ORAL | 3 refills | Status: DC

## 2022-04-22 MED ORDER — FUROSEMIDE 20 MG PO TABS: 40.0000 mg | ORAL_TABLET | Freq: Every day | ORAL | 3 refills | Status: DC

## 2022-04-22 MED ORDER — POTASSIUM CHLORIDE CRYS ER 20 MEQ PO TBCR: 20.0000 meq | EXTENDED_RELEASE_TABLET | Freq: Every day | ORAL | 3 refills | Status: DC

## 2022-04-22 MED ORDER — PANTOPRAZOLE SODIUM 40 MG PO TBEC: 40.0000 mg | DELAYED_RELEASE_TABLET | Freq: Every day | ORAL | 3 refills | Status: DC

## 2022-04-22 NOTE — Patient Instructions (Signed)
START Lasix 40 mg daily  START Potassium 20 Meq ( 1 Tab) daily.  Labs done today, your results will be available in MyChart, we will contact you for abnormal readings.  You have been referred to the [facility]. They will call you to arrange your appointment.  Dr. Lavella Hammock office will call you to arrange your appointment.   Your physician recommends that you schedule a follow-up appointment in: 2 months  If you have any questions or concerns before your next appointment please send [place] a message through [place] or call our office at [phone].    TO LEAVE A MESSAGE FOR THE NURSE SELECT OPTION 2, PLEASE LEAVE A MESSAGE INCLUDING: YOUR NAME DATE OF BIRTH CALL BACK NUMBER REASON FOR CALL**this is important as we prioritize the call backs  YOU WILL RECEIVE A CALL BACK THE SAME DAY AS LONG AS YOU CALL BEFORE 4:00 PM  At the [facility], you and your health needs are our priority. As part of our continuing mission to provide you with exceptional heart care, we have created designated Provider Care Teams. These Care Teams include your primary Cardiologist (physician) and Advanced Practice Providers (APPs- Physician Assistants and Nurse Practitioners) who all work together to provide you with the care you need, when you need it.   You may see any of the following providers on your designated Care Team at your next follow up: Dr Arvilla Meres Dr Marca Ancona Dr. Marcos Eke, NP Robbie Lis, [place] [facility], [place] Brynda Peon, NP Karle Plumber, PharmD   Please be sure to bring in all your medications bottles to every appointment.

## 2022-04-22 NOTE — Progress Notes (Signed)
b  Advanced Heart Failure Clinic Note  PCP: [facility]  Cardiologist: Dr. Gala Romney  HPI: Randy [name] is a 51 year old with a history of HFrEF, paroxsymal A fib, HTN, hypertensive retinopathy, CKD Stage IIIa, hyperlipidemia, DMII, Bells palsy, CVA, and OSA.    Admitted in 12/02/2019 with newly diagnosed A fib + ischemic CVA. He did not meet criteria for IV tPA. MRI showed large acute focal infarct, left frontal lobe predominantly ACA distribution. CT angiogram of the head and neck performed revealing no significant carotid, vertebral or intracranial stenosis. Echocardiogram obtained with bubble study showing no cardiac etiology for stroke. LVEF was 40 to 45%.Placed on Toprol XL. Creatinine on 12/04/20 1.7.    Followed at [facility] for cardiology services. Saw Cyndia Diver on 12/12/2019. Set up for sleep study. Using CPAP 3 times a week.   Admitted 10/22 with A/C HFrEF and A fib RVR.  Echo showed EF down to 30-35% with speckled appears concerning for amyloid.Diuresed with IV lasix and placed on Amio drip. Once diuresed had TEE-DC-CV with restoration of NSR. Had RHC/LHC with min obs CAD, elevated filling pressures and normal cardiac output.  Diuresed 40 pounds. Discharge weight 367 pound.  Echo 3/23 EF 55% severe LVH.  Admitted 5/23 with osteomyelitis R great toe and cellulitis RLE. Placed on IV abx. S/p R first ray amputation + placement wound VAC. AHF consulted at patient's request, lasix stopped. Discharged home, weight 363 lbs. Wound later dehisced and underwent surgical debridement 6/23.   Returns for f/u. Amiodarone stopped in 8/23 due to elevated thyroid tests. Recently started on methimazole 5 bid and prednisone (about 1 week ago). Feels good. Denies CP or SOB. Having some edema. Weight up   Taking lasix PRN Says HR in 70s at home    Cardiac Testing - Echo (3/23): EF 55%, severe LVH  - Echo (10/22): EF 30-35% specked appearance concerning for amyloid. RV normal. LA severely  dilated. Severe LVH.   - Echo (8/21): EF 40-45% and severe LVH   - R/LHC (10/22)  Ao = 163/108 (134) LV = 136/25 RA =  15 RV = 51/13 PA = 57/19 (39) PCW = 28 Fick cardiac output/index = 9.2/3.0 PVR = 1.2 WU Ao sat = 94% PA sat = 65%, 65%    1. Mild non-obstructive CAD 2. NICM (likely hypertensive) with EF 35% by echo 3. Elevated filling pressures with normal CO 4. Severe systemic HTN  ROS: All systems negative except as listed in HPI, PMH and Problem List.  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    Comment: [place]  Tobacco Use   Smoking status: Former    Types: Cigars   Smokeless tobacco: Never   Tobacco comments:    monthly, when going out  Vaping Use   Vaping Use: Never used  Substance and Sexual Activity   Alcohol use: Yes    Comment: Occasional   Drug use: No   Sexual activity: Not on file  Other Topics Concern   Not on file  Social History Narrative   Newly married, 2012, 1 daughter, 1 son   No injectable steroid cycles   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, 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)  Regular exercise-yes   Social Determinants of Health   Financial Resource Strain: Low Risk  (01/27/2021)   Overall Financial Resource Strain (CARDIA)    Difficulty of Paying Living Expenses: Not hard at all  Food Insecurity: No Food Insecurity (02/11/2021)   Hunger Vital Sign    Worried About Running Out of Food in the Last Year: Never true    Ran Out of Food in the Last Year: Never true  Transportation Needs: No Transportation Needs (01/27/2021)   PRAPARE - Administrator, Civil Service (Medical): No    Lack of Transportation (Non-Medical): No  Physical Activity: Not on file  Stress: Not on  file  Social Connections: Not on file  Intimate Partner Violence: Not on file   FH:  Family History  Problem Relation Age of Onset   Hypertension Mother    Hypertension Father    Diabetes Father    Diabetes Sister    Hypertension Brother    Kidney disease Paternal Grandmother        ESRD   Coronary artery disease Neg Hx    Stroke Neg Hx    Cancer Neg Hx    Past Medical History:  Diagnosis Date   CKD (chronic kidney disease) stage 3, GFR 30-59 ml/min (HCC)    baseline Cr 1.5-1.7   Decreased visual acuity    Left eye, resolved - hypertensive retinopathy   Diabetes mellitus 2012   Type II   Hilar adenopathy    on CT scan 02/2010, on rpt scan stable/improved.   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   Systolic murmur    Vitamin D deficiency    Current Outpatient Medications  Medication Sig Dispense Refill   amLODipine (NORVASC) 5 MG tablet Take 1 tablet (5 mg total) by mouth daily. 90 tablet 3   apixaban (ELIQUIS) 5 MG TABS tablet Take 1 tablet (5 mg total) by mouth 2 (two) times daily. 180 tablet 3   atorvastatin (LIPITOR) 20 MG tablet Take 1 tablet (20 mg total) by mouth at bedtime. 90 tablet 3   ENTRESTO 97-103 MG Take 1 tablet by mouth twice daily 60 tablet 11   furosemide (LASIX) 20 MG tablet Take 1 tablet (20 mg total) by mouth daily as needed for fluid or edema. 30 tablet 3   isosorbide-hydrALAZINE (BIDIL) 20-37.5 MG tablet Take 2 tablets by mouth 3 (three) times daily.     JARDIANCE 10 MG TABS tablet Take 1 tablet by mouth once daily 90 tablet 3   methimazole (TAPAZOLE) 5 MG tablet Take 1 tablet (5 mg total) by mouth 2 (two) times daily. 60 tablet 1   metoprolol succinate  (TOPROL-XL) 100 MG 24 hr tablet TAKE 1 TABLET BY MOUTH ONCE DAILY (TAKE  WITH  OR  IMMEDIATELY  FOLLOWING  A  MEAL) 90 tablet 1   predniSONE (DELTASONE) 10 MG tablet Take 2 tablets (20 mg total) by mouth daily with breakfast. 30 tablet 1   tirzepatide (MOUNJARO) 15 MG/0.5ML Pen Inject 15 mg into the skin once a week. 2 mL 11   No current facility-administered medications for this encounter.  BP (!) 150/90   Pulse (!) 106   Wt (!) 170.6 kg (376 lb)   SpO2 97%   BMI 41.31 kg/m   Wt Readings from Last 3 Encounters:  04/22/22 (!) 170.6 kg (376 lb)  03/25/22 (!) 165.2 kg (364 lb 3.2 oz)  01/26/22 (!) 156.5 kg (345 lb)   PHYSICAL EXAM: General:  Well appearing. No resp difficulty HEENT: normal Neck: supple. no JVD. Carotids 2+ bilat; no bruits. No lymphadenopathy or thryomegaly appreciated. Cor: PMI nondisplaced. Irreg tachy  No rubs, gallops or murmurs. Lungs: clear Abdomen: soft, nontender, nondistended. No hepatosplenomegaly. No bruits or masses. Good bowel sounds. Extremities: no cyanosis, clubbing, rash, 1-2+ edema Neuro: alert & orientedx3, cranial nerves grossly intact. moves all 4 extremities w/o difficulty. Affect pleasant   ECG (personally reviewed): AF 102 No ST-T wave abnormalities.    ASSESSMENT & PLAN:   1. 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 LHC w/ mild nonobstructive CAD. Suspect HTN cardiomyopathy.  - Echo 01/31/21 EF 30-35% with severe LVH. Doubt amyloid with uncontrolled hypertension however if EF drops again will need cMRI if he fits - Echo 3/23 EF 55-60% (recovered EF) - Echo 04/03/22 EF 55-60% - NYHA I-II. Volume up in setting of AF - Resume lasix 40 daily/K 20 dialy - Continue Entresto 97-103 mg bid. - Continue Jardiance 10 mg daily.  - Continue Bidil 2 tab tid.  - Continue Toprol XL 100 mg daily. - Continue spiro 25 mg daily. - Labs today.   2. PAF - S/P TEE/DC-CV (10/22) with restoration NSR  - Recurrent AF 3/23 s/p DCCV -.NSR on ECG  7/23 - Back in AF after amio stopped in 8/23 due to hypothyroidism  - Continue Eliquis 5 mg bid. - Zio 12/23 100% rate 102 on average (rang 66-164) - Refer to AF clinic to consider alternate AAD (now that EF improved) or possible AF ablation (preferable). Doubt he will hold NSR without AAD.  - Encouraged use of CPAP   3. Hypertension  - BP mildly elevated - Add amlodipine 5 - Encouraged him to get back on CPAP    4. CKD Stage IIIb  - Baseline SCr 1.7-2.0 - Refer to Nephrology. - Labs today.   5. DMII - Poorly controlled, A1c 10.1  - He is not on insulin. - Continue Jardiance. No GU symptoms. - Needs PCP/Endocrinology. Given list of PCPs today.   6. OSA - On CPAP.    7. CAD - Mild nonobstructive on cath - No chest pain.  - c/w statin, LDL Goal < 70.  - Continue ? blocker. - No ASA w/ Eliquis.   8. S/p R great toe amputation - s/p R LE Osteomyelitis/R lower leg cellulitis, required Wound VAC 5/23. - Resolved  9. Obesity - Body mass index is 41.31 kg/m. - Now on Mounjaro   10. Hyperthyroidism - felt to be due to amio - Amio stopped - Methimazole and prednisone started 12/23 - Will add protonix - Will refer to Dr. Lafe Garin - Recheck thyroid panels today t   Arvilla Meres, MD  10:54 AM

## 2022-04-23 LAB — T3, FREE: T3, Free: 2.3 pg/mL (ref 2.0–4.4)

## 2022-04-24 ENCOUNTER — Encounter (HOSPITAL_COMMUNITY): Payer: 59 | Admitting: Internal Medicine

## 2022-04-28 ENCOUNTER — Ambulatory Visit: Payer: 59 | Admitting: Internal Medicine

## 2022-04-28 NOTE — Progress Notes (Deleted)
Patient ID: Randy [name], male   DOB: [birth year], 52 y.o.   MRN: [id number]  HPI: Efrain Clauson is a 52 y.o.-year-old male, referred by his cardiologist, Dr. Haroldine Laws, for management of DM2, dx in 2011, insulin-dependent (but not on insulin), uncontrolled, with complications.  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  DM2: Reviewed HbA1c: Lab Results  Component Value Date   HGBA1C 10.1 (H) 09/14/2021   HGBA1C 6.3 (H) 01/27/2021   HGBA1C 7.4 (H) 08/08/2020   HGBA1C 12.5 (H) 11/28/2014   HGBA1C 12.5 (H) 11/10/2013   HGBA1C 6.6 (H) 04/03/2011   HGBA1C 6.6 (H) 09/05/2010   HGBA1C (H) 03/12/2010    6.6 (NOTE)                                                                       According to the ADA Clinical Practice Recommendations for 2011, when HbA1c is used as a screening test:   >=6.5%   Diagnostic of Diabetes Mellitus           (if abnormal result  is confirmed)  5.7-6.4%   Increased risk of developing Diabetes Mellitus  References:Diagnosis and Classification of Diabetes Mellitus,Diabetes GXQJ,1941,74(YCXKG 1):S62-S69 and Standards of Medical Care in         Diabetes - 2011,Diabetes Care,2011,34  (Suppl 1):S11-S61.   Pt is on a regimen of: - Metformin 1000 mg 2x a day, with meals  Pt checks his sugars *** a day and they are: - am: n/c - 2h after b'fast: n/c - before lunch: n/c - 2h after lunch: n/c - before dinner: n/c - 2h after dinner: n/c - bedtime: n/c - nighttime: n/c Lowest sugar was ***; he has hypoglycemia awareness at 70.  Highest sugar was ***.  Glucometer:***  Pt's meals are: - Breakfast: - Lunch: - Dinner: - Snacks:  - + CKD, last BUN/creatinine:  Lab Results  Component Value Date   BUN 30 (H) 04/22/2022   BUN 21 (H) 03/25/2022   CREATININE 1.64 (H) 04/22/2022   CREATININE 1.79 (H) 03/25/2022    - + Dyslipidemia; last set of lipids: Lab Results  Component Value Date   CHOL 91 08/08/2020   HDL 39 (L) 08/08/2020    LDLCALC 38 08/08/2020   LDLDIRECT 79.0 11/28/2014   TRIG 69 08/08/2020   CHOLHDL 2.3 08/08/2020    - last eye exam was in ***. No DR.   - no numbness and tingling in his feet.  Pt has FH of DM in ***.  Amiodarone-induced thyrotoxicosis:  Amiodarone was stopped 11/2021 due to thyrotoxicosis.  He is currently on: - Methimazole 5 mg 2x a day - Prednisone 20 mg daily Both started 04/15/2022 by Dr. Haroldine Laws.  Lab Results  Component Value Date   TSH 0.010 (L) 04/22/2022   TSH <0.010 (L) 03/25/2022   TSH <0.010 (L) 02/03/2022   TSH 0.103 (L) 11/21/2021   TSH 0.90 09/05/2010   TSH 1.345 03/13/2010   Lab Results  Component Value Date   FREET4 2.08 (H) 04/22/2022   FREET4 2.89 (H) 03/25/2022   FREET4 2.50 (H) 02/03/2022   FREET4 2.20 (H) 12/05/2021   Lab Results  Component Value Date   T3FREE 2.3 04/22/2022   T3FREE 3.4 03/25/2022  T3FREE 2.0 02/03/2022   T3FREE 2.6 12/05/2021   No results found for: "TSI"  ROS: Constitutional: no weight gain, no weight loss, no fatigue, no subjective hyperthermia, no subjective hypothermia, no nocturia Eyes: no blurry vision, no xerophthalmia ENT: no sore throat, no nodules palpated in neck, no dysphagia, no odynophagia, no hoarseness, no tinnitus, no hypoacusis Cardiovascular: no CP, no SOB, no palpitations, no leg swelling Respiratory: no cough, no SOB, no wheezing Gastrointestinal: no N, no V, no D, no C, no acid reflux Musculoskeletal: no muscle, no joint aches Skin: no rash, no hair loss Neurological: no tremors, no numbness or tingling/no dizziness/no HAs Psychiatric: no depression, no anxiety  PE: There were no vitals taken for this visit. Wt Readings from Last 3 Encounters:  04/22/22 (!) 376 lb (170.6 kg)  03/25/22 (!) 364 lb 3.2 oz (165.2 kg)  01/26/22 (!) 345 lb (156.5 kg)   Constitutional: overweight, in NAD Eyes: EOMI, no exophthalmos ENT: no thyromegaly, no cervical lymphadenopathy Cardiovascular: RRR, No  MRG Respiratory: CTA B Musculoskeletal: no deformities Skin: moist, warm, no rashes Neurological: no tremor with outstretched hands, DTR normal in all 4  ASSESSMENT: 1. DM2, non-insulin-dependent, uncontrolled, without long-term complications  2.  Amiodarone induced thyrotoxicosis  PLAN:  1. Patient with long-standing, uncontrolled diabetes, on oral antidiabetic regimen, which became insufficient. - I suggested to:  There are no Patient Instructions on file for this visit. - Strongly advised him to start checking sugars at different times of the day - check 1x a day, rotating checks - discussed about CBG targets for treatment: 80-130 mg/dL before meals and <180 mg/dL after meals; target HbA1c <7%. - given sugar log and advised how to fill it and to bring it at next appt  - given foot care handout and explained the principles  - given instructions for hypoglycemia management "15-15 rule"  - advised for yearly eye exams  - Return to clinic in 3 mo with sugar log   2.  Amiodarone induced thyrotoxicosis Patient with a recently found undetectable TSH and high free T4 in the setting of amiodarone treatment.  He denies thyrotoxic sxs: weight loss, heat intolerance, hyperdefecation, palpitations, anxiety.  -Of note, he has a cardiac history significant for CHF and was on amiodarone, but stopped 11/2021 due to thyrotoxicosis -Reviewed previous TFTs and they have been normal up to *** - he does not appear to have exogenous causes for the low TSH other than amiodarone.  - We discussed that in the setting of amiodarone treatment it is possible that he has: 1. Type 1 AIT (iodine induced hyperthyroidism >> increase thyroid hormone production) 2. Type 2 AIT (iodine-induced thyroiditis >> increase thyroid hormone leakage from the inflamed gland) However, it is also possible that he may have: Graves ds  (will check TSI antibodies) toxic multinodular goiter/ toxic adenoma (I cannot feel nodules at  palpation of his thyroid). - will re-check the TSH, fT3 and fT4 and also add thyroid stimulating antibodies to screen for Graves' disease.  - While patients are on amiodarone, a thyroid uptake and scan is not helpful, since the thyroid is already saturated with iodine - Thyroid ultrasound to check blood flow could be useful but is not always very helpful - Because he is usually difficult to differentiate between type 1 AIT (treated with MMI) and type 2 AIT (treated with steroids), we usually treat for both at the same time and tapered the medicines as indicated while we monitor TFTs. - For now, I suggested to  add prednisone (I usually high doses used, but for him I would like to try 5 mg daily) and continue his 10 mg of methimazole  -After we started treatment, we will repeat TFTs in 1 month - I advised him to join my chart to communicate easier - RTC in 3 months, but sooner for repeat labs   Philemon Kingdom, MD PhD [facility]

## 2022-04-29 ENCOUNTER — Other Ambulatory Visit (HOSPITAL_COMMUNITY): Payer: Self-pay | Admitting: *Deleted

## 2022-04-29 ENCOUNTER — Other Ambulatory Visit (HOSPITAL_COMMUNITY): Payer: Self-pay | Admitting: Family Medicine

## 2022-04-29 MED ORDER — ISOSORB DINITRATE-HYDRALAZINE 20-37.5 MG PO TABS: 2.0000 | ORAL_TABLET | Freq: Three times a day (TID) | ORAL | 3 refills | Status: DC

## 2022-05-06 ENCOUNTER — Encounter: Payer: Self-pay | Admitting: Internal Medicine

## 2022-05-06 ENCOUNTER — Ambulatory Visit: Payer: 59 | Admitting: Internal Medicine

## 2022-05-06 VITALS — BP 148/100 | HR 98 | Ht >= 80 in | Wt 373.4 lb

## 2022-05-06 DIAGNOSIS — E0580 Other thyrotoxicosis without thyrotoxic crisis or storm: Secondary | ICD-10-CM | POA: Diagnosis not present

## 2022-05-06 DIAGNOSIS — T462X5A Adverse effect of other antidysrhythmic drugs, initial encounter: Secondary | ICD-10-CM | POA: Diagnosis not present

## 2022-05-06 DIAGNOSIS — E1165 Type 2 diabetes mellitus with hyperglycemia: Secondary | ICD-10-CM

## 2022-05-06 DIAGNOSIS — E1159 Type 2 diabetes mellitus with other circulatory complications: Secondary | ICD-10-CM | POA: Diagnosis not present

## 2022-05-06 LAB — LIPID PANEL
Cholesterol: 136 mg/dL (ref 0–200)
HDL: 68.9 mg/dL (ref >39.00)
LDL Cholesterol: 57 mg/dL (ref 0–99)
NonHDL: 67.18
Total CHOL/HDL Ratio: 2
Triglycerides: 52 mg/dL (ref 0.0–149.0)
VLDL: 10.4 mg/dL (ref 0.0–40.0)

## 2022-05-06 LAB — POCT GLYCOSYLATED HEMOGLOBIN (HGB A1C): Hemoglobin A1C: 5.9 % — AB (ref 4.0–5.6)

## 2022-05-06 LAB — T4, FREE: Free T4: 1.83 ng/dL — ABNORMAL HIGH (ref 0.60–1.60)

## 2022-05-06 LAB — T3, FREE: T3, Free: 3.8 pg/mL (ref 2.3–4.2)

## 2022-05-06 LAB — TSH: TSH: 0.03 u[IU]/mL — ABNORMAL LOW (ref 0.35–5.50)

## 2022-05-06 MED ORDER — FREESTYLE LIBRE 3 SENSOR MISC: 1.0000 | 3 refills | Status: DC

## 2022-05-06 MED ORDER — TIRZEPATIDE 12.5 MG/0.5ML ~~LOC~~ SOAJ: 12.5000 mg | SUBCUTANEOUS | 5 refills | Status: DC

## 2022-05-06 NOTE — Progress Notes (Signed)
Patient ID: Randy [name], male   DOB: 02/05/1971, 52 y.o.   MRN: [id number]  HPI: Randy [name] is a 52 y.o.-year-old male, referred by his cardiologist, Dr. 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  DM2: Reviewed his HbA1c levels: Lab Results  Component Value Date   HGBA1C 10.1 (H) 09/14/2021   HGBA1C 6.3 (H) 01/27/2021   HGBA1C 7.4 (H) 08/08/2020   HGBA1C 12.5 (H) 11/28/2014   HGBA1C 12.5 (H) 11/10/2013   HGBA1C 6.6 (H) 04/03/2011   HGBA1C 6.6 (H) 09/05/2010   HGBA1C (H) 03/12/2010    6.6 (NOTE)                                                                       According to the ADA Clinical Practice Recommendations for 2011, when HbA1c is used as a screening test:   >=6.5%   Diagnostic of Diabetes Mellitus           (if abnormal result  is confirmed)  5.7-6.4%   Increased risk of developing Diabetes Mellitus  References:Diagnosis and Classification of Diabetes Mellitus,Diabetes Care,2011,34(Suppl 1):S62-S69 and Standards of Medical Care in         Diabetes - 2011,Diabetes Care,2011,34  (Suppl 1):S11-S61.   He is on a regimen of: - Jardiance 10 mg before breakfast - Mounjaro 15 mg weekly - started 2 mo ago - tolerated well Metformin stopped 2/2 CKD, AP. He has not been on insulin.  Pt checks his sugars every 2 days and they are: - am: 60s-70s - 2h after b'fast: n/c - before lunch: n/c - 2h after lunch: 110-120 - before dinner: n/c - 2h after dinner: n/c - bedtime: n/c - nighttime: n/c Lowest sugar was 40; he has hypoglycemia awareness at 40.  Highest sugar was 120.  Glucometer:One Touch  Pt's meals are: - Breakfast: steak biscuit  - Lunch: eating out - Dinner: skips, or veggies - Snacks: used to drink gatorade, not anymore  - + CKD, last BUN/creatinine:  Lab Results   Component Value Date   BUN 30 (H) 04/22/2022   BUN 21 (H) 03/25/2022   CREATININE 1.64 (H) 04/22/2022   CREATININE 1.79 (H) 03/25/2022  He is on Entresto.  - + HL; last set of lipids: Lab Results  Component Value Date   CHOL 91 08/08/2020   HDL 39 (L) 08/08/2020   LDLCALC 38 08/08/2020   LDLDIRECT 79.0 11/28/2014   TRIG 69 08/08/2020   CHOLHDL 2.3 08/08/2020  He is on Lipitor 20 mg daily.  - last eye exam was in 03/2022. No DR reportedly.   - no numbness and tingling in his feet. Last foot exam 03/24/2022: [name].  Pt has FH of DM - brother, sister, father.  Amiodarone-induced thyrotoxicosis:  Amiodarone was stopped 11/2021 due to thyrotoxicosis.  He is currently on the following regimen, started 04/15/2022 by Dr. Gala Romney: - Methimazole 5 mg twice a day - Prednisone 20 mg daily  Lab Results  Component Value Date   TSH 0.010 (L) 04/22/2022   TSH <0.010 (L) 03/25/2022   TSH <0.010 (L) 02/03/2022   TSH 0.103 (  L) 11/21/2021   TSH 0.90 09/05/2010   TSH 1.345 03/13/2010   Lab Results  Component Value Date   FREET4 2.08 (H) 04/22/2022   FREET4 2.89 (H) 03/25/2022   FREET4 2.50 (H) 02/03/2022   FREET4 2.20 (H) 12/05/2021   Lab Results  Component Value Date   T3FREE 2.3 04/22/2022   T3FREE 3.4 03/25/2022   T3FREE 2.0 02/03/2022   T3FREE 2.6 12/05/2021   No results found for: "TSI"  Pt denies: - weight loss - heat intolerance - tremors - palpitations - anxiety - hyperdefecation  No FH of thyroid cancer. (Wife has Graves ds). No h/o radiation tx to head or neck. No recent contrast studies. No herbal supplements. No Biotin use.   CTA neck (07/28/2020): Chronic moderate diffuse enlargement of the thyroid gland without significant heterogeneity or a discrete nodule and for which no imaging follow-up is recommended.  She also has a history of significant HTN, obesity- 450 lbs at his highest weight.  ROS: Constitutional: + weight gain (thinks  related to fluid), no fatigue, no subjective hyperthermia, no subjective hypothermia, no nocturia Eyes: no blurry vision, no xerophthalmia ENT: no sore throat, no dysphagia, no odynophagia, no hoarseness, no tinnitus, no hypoacusis Cardiovascular: no CP, no SOB, no palpitations, + leg swelling Respiratory: no cough, no SOB, no wheezing Gastrointestinal: no N, no V, no D, no C, no acid reflux Musculoskeletal: no muscle, no joint aches Skin: no rash, no hair loss Neurological: no tremors, no numbness or tingling/no dizziness/no HAs  Past Medical History:  Diagnosis Date   CKD (chronic kidney disease) stage 3, GFR 30-59 ml/min (HCC)    baseline Cr 1.5-1.7   Decreased visual acuity    Left eye, resolved - hypertensive retinopathy   Diabetes mellitus 2012   Type II   Hilar adenopathy    on CT scan 02/2010, on rpt scan stable/improved.   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   Systolic murmur    Vitamin D deficiency    Past Surgical History:  Procedure Laterality Date   AMPUTATION Right 09/17/2021   Procedure: First ray amputation;  Surgeon: Newt Minion, MD;  Location: [place];  Service: Orthopedics;  Laterality: Right;   APPLICATION OF WOUND VAC Right 10/10/2021   Procedure: APPLICATION OF WOUND VAC;  Surgeon: Newt Minion, MD;  Location: [facility];  Service: Orthopedics;  Laterality: Right;   CARDIOVERSION N/A 01/31/2021   Procedure: CARDIOVERSION;  Surgeon: Jolaine Artist, MD;  Location: [place];  Service: Cardiovascular;  Laterality: N/A;   CARDIOVERSION N/A 07/04/2021   Procedure: CARDIOVERSION;  Surgeon: Jolaine Artist, MD;  Location: [facility] ENDOSCOPY;  Service: Cardiovascular;   Laterality: N/A;   hospitalization  11/2008   malignant HTN, nl SPEP/UPEP, neg ANCA panel, nl C3/4, neg anti GBM Ab, neg Hep A/B/C, nl renal US, nl PTH, neg HIV   I & D EXTREMITY Right 10/10/2021   Procedure: RIGHT FOOT DEBRIDEMENT;  Surgeon: Newt Minion, MD;  Location: [place];  Service: Orthopedics;  Laterality: Right;   RIGHT/LEFT HEART CATH AND CORONARY ANGIOGRAPHY N/A 01/29/2021   Procedure: RIGHT/LEFT HEART CATH AND  CORONARY ANGIOGRAPHY;  Surgeon: Jolaine Artist, MD;  Location: [facility];  Service: Cardiovascular;  Laterality: N/A;   TEE WITHOUT CARDIOVERSION N/A 01/31/2021   Procedure: TRANSESOPHAGEAL ECHOCARDIOGRAM (TEE);  Surgeon: Jolaine Artist, MD;  Location: [facility] ENDOSCOPY;  Service: Cardiovascular;  Laterality: N/A;   US ECHOCARDIOGRAPHY  11/2008   LVsys fxn EF 50%, mild MR, normal LV size, neg ANA, neg cryoglobulins   US ECHOCARDIOGRAPHY  2011   severe LVH, EF 40%, LA mildly dilated, PA pressure moderately increased   Social History   Socioeconomic History   Marital status: Married    Spouse name: Macklen Wilhoite   Number of children: 3   Years of education: Not on file   Highest education level: Bachelor's degree (e.g., BA, AB, BS)  Occupational History   Occupation: Tax Forensic psychologist    Comment: [place]  Tobacco Use   Smoking status: Former    Types: Cigars   Smokeless tobacco: Never   Tobacco comments:    monthly, when going out  Vaping Use   Vaping Use: Never used  Substance and Sexual Activity   Alcohol use: Yes    Comment: Occasional   Drug use: No   Sexual activity: Not on file  Other Topics Concern   Not on file  Social History Narrative   Newly married, 2012, 1 daughter, 1 son   No injectable steroid cycles   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, 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)   Regular exercise-yes   Social Determinants of Health   Financial Resource Strain: Low Risk  (01/27/2021)   Overall Financial Resource  Strain (CARDIA)    Difficulty of Paying Living Expenses: Not hard at all  Food Insecurity: No Food Insecurity (02/11/2021)   Hunger Vital Sign    Worried About Running Out of Food in the Last Year: Never true    Ran Out of Food in the Last Year: Never true  Transportation Needs: No Transportation Needs (01/27/2021)   PRAPARE - Hydrologist (Medical): No    Lack of Transportation (Non-Medical): No  Physical Activity: Not on file  Stress: Not on file  Social Connections: Not on file  Intimate Partner Violence: Not on file   Current Outpatient Medications on File Prior to Visit  Medication Sig Dispense Refill   amLODipine (NORVASC) 5 MG tablet Take 1 tablet (5 mg total) by mouth daily. 90 tablet 3   apixaban (ELIQUIS) 5 MG TABS tablet Take 1 tablet (5 mg total) by mouth 2 (two) times daily. 180 tablet 3   atorvastatin (LIPITOR) 20 MG tablet Take 1 tablet (20 mg total) by mouth at bedtime. 90 tablet 3   ENTRESTO 97-103 MG Take 1 tablet by mouth twice daily 60 tablet 11   furosemide (LASIX) 20 MG tablet Take 2 tablets (40 mg total) by mouth daily. 90 tablet 3   isosorbide-hydrALAZINE (BIDIL) 20-37.5 MG tablet Take 2 tablets by mouth 3 (three) times daily. 180 tablet 3   JARDIANCE 10 MG TABS tablet Take 1 tablet by mouth once daily 90 tablet 3   methimazole (TAPAZOLE) 5 MG tablet Take 1 tablet (5 mg total) by mouth 2 (two) times daily. 60 tablet 1   metoprolol succinate (TOPROL-XL) 100 MG 24 hr tablet TAKE 1 TABLET BY MOUTH ONCE DAILY (TAKE  WITH  OR  IMMEDIATELY  FOLLOWING  A  MEAL) 90 tablet 1   pantoprazole (PROTONIX) 40 MG tablet Take 1 tablet (40 mg  total) by mouth daily. 90 tablet 3   potassium chloride SA (KLOR-CON M) 20 MEQ tablet Take 1 tablet (20 mEq total) by mouth daily. 180 tablet 3   predniSONE (DELTASONE) 10 MG tablet Take 2 tablets (20 mg total) by mouth daily with breakfast. 30 tablet 1   tirzepatide (MOUNJARO) 15 MG/0.5ML Pen Inject 15 mg into the  skin once a week. 2 mL 11   No current facility-administered medications on file prior to visit.   Allergies  Allergen Reactions   Fish-Derived Products Anaphylaxis   Family History  Problem Relation Age of Onset   Hypertension Mother    Hypertension Father    Diabetes Father    Diabetes Sister    Hypertension Brother    Kidney disease Paternal Grandmother        ESRD   Coronary artery disease Neg Hx    Stroke Neg Hx    Cancer Neg Hx    PE: BP (!) 148/100 (BP Location: Right Arm, Patient Position: Sitting, Cuff Size: Normal)   Pulse 98   Ht 6\' 8"  (2.032 m)   Wt (!) 373 lb 6.4 oz (169.4 kg)   SpO2 99%   BMI 41.02 kg/m  Wt Readings from Last 3 Encounters:  05/06/22 (!) 373 lb 6.4 oz (169.4 kg)  04/22/22 (!) 376 lb (170.6 kg)  03/25/22 (!) 364 lb 3.2 oz (165.2 kg)   Constitutional: overweight, in NAD Eyes:  EOMI, no exophthalmos, no lid lag, no stare ENT: no neck masses, no thyroid bruits, no cervical lymphadenopathy Cardiovascular: RRR, No MRG, + significant bilateral leg swelling Respiratory: CTA B Musculoskeletal: no deformities Skin:no rashes Neurological: no tremor with outstretched hands  ASSESSMENT: 1. DM2, non-insulin-dependent, uncontrolled, with complications - CHF - A-fib with RVR - h/o CVA, CKD - osteomyelitis of the right foot, history of hallux amputation  2.  Amiodarone-induced thyrotoxicosis  PLAN:  1. Patient with long-standing, type II diabetes, on oral antidiabetic regimen with SGLT2 inhibitor, and also GLP-1/GIP receptor agonist, with poor control.  HbA1c obtained in 08/2021 was 10.1%.  He was started on Mounjaro 2 months ago.  He is already using the highest dose, 15 mg weekly.  He tolerates this well.  He saw significant improvement in blood sugars after adding Mounjaro, and they are now mostly between 40 and 100.  He has hypoglycemia awareness only when he gets to 40s.  We discussed that this is too low, and we need to relax his blood sugar  control enough to allow him to feel blood sugars in the 70s or slightly lower.  For now, my suggestion was to decrease the Mounjaro dose from 15 to 12.5 mg weekly.  If he continues to have low blood sugars we may need to reduce this even further. -Will continue Jardiance for now.  He tolerates this well.  He is on a half maximal dose. -At today's visit, we also discussed about his diet.  He has fast food in the morning, and on his way to work, and he eats out at lunch.  He usually does not eat dinner or eats only veggies.  He absolutely needs to reduce the amount of fat and salt in his diet.  We discussed about batch cooking to take to lunch and fixing either a protein shake or a whole wheat sandwich for the morning. -At today's visit I also suggested a CGM.  This will help not only with alarms whenever his sugars are increasing or decreasing too much, but will trigger  behavioral modification to help with his diet.  He agrees with this. - I suggested to:  Patient Instructions  Please continue: - Jardiance 10 mg daily in am  Decrease: - Mounjaro to 12.5 mg weekly  Continue: - Methimazole 5 mg twice a day - Prednisone 20 mg daily  Please stop at the lab.  Please return in 3 months.  - Strongly advised him to start checking sugars at different times of the day - check 4x a day, rotating checks - discussed about CBG targets for treatment: 80-130 mg/dL before meals and <025 mg/dL after meals; target ENI7P <7%. - advised for yearly eye exams  -Will check a lipid panel now - Return to clinic in 3 mo with sugar log   2.  Amiodarone induced thyrotoxicosis -Patient with recently found to have undetectable TSH and high free T4, in the setting of amiodarone treatment.  He denies thyrotoxic symptoms: Weight loss, heat intolerance, hyperdefecation, palpitations, anxiety -Of note, he has a cardiac history significant for CHF and was on amiodarone, but stopped in 11/2021 due to thyrotoxicosis -I  communicated with Dr. Gala Romney at that time.  He started the patient on methimazole 5 mg twice a day and prednisone 20 mg daily.  He is also on Toprol-XL 100 mg daily. -He does not appear to have other possible culprits for the thyrotoxicosis -We discussed that in the setting of amiodarone treatment, there are 2 possible causes for thyrotoxicosis: 1. Type 1 AIT (iodine induced hyperthyroidism >> increase thyroid hormone production) 2. Type 2 AIT (iodine-induced thyroiditis >> increase thyroid hormone leakage from the inflamed gland) However, it is also possible to have: Graves' disease (will check TSI antibodies) Toxic multinodular goiter/toxic adenoma (I cannot feel nodules at palpation of his thyroid) -At today's visit, we will recheck the TSH, free T4, and free T3 and will add thyroid-stimulating antibodies to screen for Graves' disease -Thyroid uptake and scan would be helpful, however, in the setting of amiodarone treatment, thyroid is likely already saturated with iodine and the uptake is undetectable or low.  He did stop the amiodarone 5 months ago, but this can stay in the system for many months after stopping. -Because it is usually difficult to differentiate between type I AIT (treated with methimazole) and type II AIT (treated with steroids) we usually start bolus and taper the medicine based on TFTs.  He is now on both medications. -We reviewed his latest TFTs from the end of last month and they have already improved, with undetectable TSH now and only slightly elevated free thyroid hormone -At today's visit, we will recheck his TFTs and I am hoping that we can decrease the prednisone to 10 mg daily and the methimazole to 5 mg daily.  Will continue to taper the doses down based on TFTs.  I will have him come back for labs in approximately 3 weeks. -I will see him back in 3 months, but likely sooner for labs  Component     Latest Ref Rng 05/06/2022  Cholesterol     0 - 200 mg/dL 824    Triglycerides     0.0 - 149.0 mg/dL 23.5   HDL Cholesterol     >39.00 mg/dL 36.14   Total CHOL/HDL Ratio 2   VLDL     0.0 - 40.0 mg/dL 43.1   LDL (calc)     0 - 99 mg/dL 57   Hemoglobin V4M     4.0 - 5.6 % 5.9 !   TSH  0.35 - 5.50 uIU/mL 0.03 (L)   T4,Free(Direct)     0.60 - 1.60 ng/dL 1.83 (H)   Triiodothyronine,Free,Serum     2.3 - 4.2 pg/mL 3.8   NonHDL 67.18   TSI     <140 % baseline <89     The thyroid tests have all improved.  The free T4 is only slightly above target now, while the free T3 is normal.  TSI antibodies are not elevated. For now would suggest to continue with the plan to reduce both methimazole and prednisone.  Will have the patient back for labs in 3 to 4 weeks. Lipid panel is at goal with the exception of an LDL that is only slightly above the target of less than 55.  Philemon Kingdom, MD PhD [facility]

## 2022-05-06 NOTE — Patient Instructions (Addendum)
Please continue: - Jardiance 10 mg daily in am  Decrease: - Mounjaro to 12.5 mg weekly  Continue: - Methimazole 5 mg twice a day - Prednisone 20 mg daily  Please stop at the lab.  Please return in 3 months.  PATIENT INSTRUCTIONS FOR TYPE 2 DIABETES:  DIET AND EXERCISE Diet and exercise is an important part of diabetic treatment.  We recommended aerobic exercise in the form of brisk walking (working between 40-60% of maximal aerobic capacity, similar to brisk walking) for 150 minutes per week (such as 30 minutes five days per week) along with 3 times per week performing 'resistance' training (using various gauge rubber tubes with handles) 5-10 exercises involving the major muscle groups (upper body, lower body and core) performing 10-15 repetitions (or near fatigue) each exercise. Start at half the above goal but build slowly to reach the above goals. If limited by weight, joint pain, or disability, we recommend daily walking in a swimming pool with water up to waist to reduce pressure from joints while allow for adequate exercise.    BLOOD GLUCOSES Monitoring your blood glucoses is important for continued management of your diabetes. Please check your blood glucoses 2-4 times a day: fasting, before meals and at bedtime (you can rotate these measurements - e.g. one day check before the 3 meals, the next day check before 2 of the meals and before bedtime, etc.).   HYPOGLYCEMIA (low blood sugar) Hypoglycemia is usually a reaction to not eating, exercising, or taking too much insulin/ other diabetes drugs.  Symptoms include tremors, sweating, hunger, confusion, headache, etc. Treat IMMEDIATELY with 15 grams of Carbs: 4 glucose tablets  cup regular juice/soda 2 tablespoons raisins 4 teaspoons sugar 1 tablespoon honey Recheck blood glucose in 15 mins and repeat above if still symptomatic/blood glucose <100.  RECOMMENDATIONS TO REDUCE YOUR RISK OF DIABETIC COMPLICATIONS: * Take your  prescribed MEDICATION(S) * Follow a DIABETIC diet: Complex carbs, fiber rich foods, (monounsaturated and polyunsaturated) fats * AVOID saturated/trans fats, high fat foods, >2,300 mg salt per day. * EXERCISE at least 5 times a week for 30 minutes or preferably daily.  * DO NOT SMOKE OR DRINK more than 1 drink a day. * Check your FEET every day. Do not wear tightfitting shoes. Contact us if you develop an ulcer * See your EYE doctor once a year or more if needed * Get a FLU shot once a year * Get a PNEUMONIA vaccine once before and once after age 35 years  GOALS:  * Your Hemoglobin A1c of <7%  * fasting sugars need to be 80-130 * after meals sugars need to be <180 (2h after you start eating) * Your Systolic BP should be 417 or lower  * Your Diastolic BP should be 80 or lower  * Your HDL (Good Cholesterol) should be 40 or higher  * Your LDL (Bad Cholesterol) should be ideally <70. * Your Triglycerides should be 150 or lower  * Your Urine microalbumin (kidney function) should be <30 * Your Body Mass Index should be 25 or lower   Please consider the following ways to cut down carbs and fat and increase fiber and micronutrients in your diet: - substitute whole grain for white bread or pasta - substitute brown rice for white rice - substitute 90-calorie flat bread pieces for slices of bread when possible - substitute sweet potatoes or yams for white potatoes - substitute humus for margarine - substitute tofu for cheese when possible - substitute almond or  rice milk for regular milk (would not drink soy milk daily due to concern for soy estrogen influence on breast cancer risk) - substitute dark chocolate for other sweets when possible - substitute water - can add lemon or orange slices for taste - for diet sodas (artificial sweeteners will trick your body that you can eat sweets without getting calories and will lead you to overeating and weight gain in the long run) - do not skip breakfast  or other meals (this will slow down the metabolism and will result in more weight gain over time)  - can try smoothies made from fruit and almond/rice milk in am instead of regular breakfast - can also try old-fashioned (not instant) oatmeal made with almond/rice milk in am - order the dressing on the side when eating salad at a restaurant (pour less than half of the dressing on the salad) - eat as little meat as possible - can try juicing, but should not forget that juicing will get rid of the fiber, so would alternate with eating raw veg./fruits or drinking smoothies - use as little oil as possible, even when using olive oil - can dress a salad with a mix of balsamic vinegar and lemon juice, for e.g. - use agave nectar, stevia sugar, or regular sugar rather than artificial sweateners - steam or broil/roast veggies  - snack on veggies/fruit/nuts (unsalted, preferably) when possible, rather than processed foods - reduce or eliminate aspartame in diet (it is in diet sodas, chewing gum, etc) Read the labels!  Try to read Dr. Janene Harvey book: "Program for Reversing Diabetes" for other ideas for healthy eating.

## 2022-05-07 ENCOUNTER — Encounter (HOSPITAL_COMMUNITY): Payer: Self-pay

## 2022-05-07 ENCOUNTER — Ambulatory Visit (HOSPITAL_COMMUNITY): Payer: 59 | Admitting: Nurse Practitioner

## 2022-05-08 LAB — THYROID STIMULATING IMMUNOGLOBULIN: TSI: <89 % baseline (ref <140)

## 2022-05-08 MED ORDER — METHIMAZOLE 5 MG PO TABS: 5.0000 mg | ORAL_TABLET | Freq: Every day | ORAL | 1 refills | Status: DC

## 2022-05-08 MED ORDER — PREDNISONE 10 MG PO TABS: 10.0000 mg | ORAL_TABLET | Freq: Every day | ORAL | 1 refills | Status: DC

## 2022-05-21 ENCOUNTER — Ambulatory Visit (HOSPITAL_COMMUNITY)
Admission: RE | Admit: 2022-05-21 | Discharge: 2022-05-21 | Disposition: A | Discharge status: Home / Self Care | Payer: 59 | Source: Ambulatory Visit | Attending: Physician Assistant | Admitting: Physician Assistant

## 2022-05-21 ENCOUNTER — Encounter (HOSPITAL_COMMUNITY): Payer: Self-pay | Admitting: Physician Assistant

## 2022-05-21 ENCOUNTER — Telehealth: Payer: Self-pay | Admitting: Orthopedic Surgery

## 2022-05-21 VITALS — BP 138/90 | HR 94 | Ht >= 80 in | Wt 365.6 lb

## 2022-05-21 DIAGNOSIS — I5022 Chronic systolic (congestive) heart failure: Secondary | ICD-10-CM | POA: Diagnosis not present

## 2022-05-21 DIAGNOSIS — I4819 Other persistent atrial fibrillation: Secondary | ICD-10-CM | POA: Diagnosis not present

## 2022-05-21 DIAGNOSIS — Z8249 Family history of ischemic heart disease and other diseases of the circulatory system: Secondary | ICD-10-CM | POA: Diagnosis not present

## 2022-05-21 DIAGNOSIS — Z833 Family history of diabetes mellitus: Secondary | ICD-10-CM | POA: Insufficient documentation

## 2022-05-21 DIAGNOSIS — E669 Obesity, unspecified: Secondary | ICD-10-CM | POA: Insufficient documentation

## 2022-05-21 DIAGNOSIS — E785 Hyperlipidemia, unspecified: Secondary | ICD-10-CM | POA: Diagnosis not present

## 2022-05-21 DIAGNOSIS — D6869 Other thrombophilia: Secondary | ICD-10-CM | POA: Insufficient documentation

## 2022-05-21 DIAGNOSIS — G4733 Obstructive sleep apnea (adult) (pediatric): Secondary | ICD-10-CM | POA: Diagnosis not present

## 2022-05-21 DIAGNOSIS — E1122 Type 2 diabetes mellitus with diabetic chronic kidney disease: Secondary | ICD-10-CM | POA: Insufficient documentation

## 2022-05-21 DIAGNOSIS — I2510 Atherosclerotic heart disease of native coronary artery without angina pectoris: Secondary | ICD-10-CM | POA: Insufficient documentation

## 2022-05-21 DIAGNOSIS — Z87891 Personal history of nicotine dependence: Secondary | ICD-10-CM | POA: Diagnosis not present

## 2022-05-21 DIAGNOSIS — I130 Hypertensive heart and chronic kidney disease with heart failure and stage 1 through stage 4 chronic kidney disease, or unspecified chronic kidney disease: Secondary | ICD-10-CM | POA: Insufficient documentation

## 2022-05-21 DIAGNOSIS — N189 Chronic kidney disease, unspecified: Secondary | ICD-10-CM | POA: Diagnosis not present

## 2022-05-21 DIAGNOSIS — I4891 Unspecified atrial fibrillation: Secondary | ICD-10-CM | POA: Insufficient documentation

## 2022-05-21 DIAGNOSIS — Z8673 Personal history of transient ischemic attack (TIA), and cerebral infarction without residual deficits: Secondary | ICD-10-CM | POA: Insufficient documentation

## 2022-05-21 DIAGNOSIS — Z6841 Body Mass Index (BMI) 40.0 and over, adult: Secondary | ICD-10-CM | POA: Insufficient documentation

## 2022-05-21 NOTE — Telephone Encounter (Signed)
I have this filled about holding pending signature. Will notify pt when ready for pick up.

## 2022-05-21 NOTE — Telephone Encounter (Signed)
Pt called requesting a renewal handicap placard. Please call pt when ready for pick up at [phone].

## 2022-05-21 NOTE — Progress Notes (Signed)
Primary Care Physician: Pcp, No Primary Cardiologist: Dr Haroldine Laws  Primary Electrophysiologist: none Referring Physician: Dr Paulo Fruit Umbach is a 52 y.o. male with a history of HFrEF, HTN, CKD, HLD, DM, CVA, OSA, CAD, atrial fibrillation who presents for consultation in the [facility]. He was admitted in 12/02/2019 with newly diagnosed A fib + ischemic CVA. He did not meet criteria for IV tPA. MRI showed large acute focal infarct, left frontal lobe predominantly ACA distribution. Echocardiogram obtained with bubble study showing no cardiac etiology for stroke. LVEF was 40 to 45%. Placed on Toprol XL and Eliquis for a CHDAS2VASC score of 6.    Followed at [facility] for cardiology services. Saw Norberto Sorenson on 12/12/2019. Set up for sleep study.    Admitted 10/22 with A/C HFrEF and A fib RVR.  Echo showed EF down to 30-35% with speckled appears concerning for amyloid. Diuresed with IV lasix and placed on Amio drip. Once diuresed had TEE-DC-CV with restoration of NSR. Had RHC/LHC with min obs CAD, elevated filling pressures and normal cardiac output.  Diuresed 40 pounds. Repeat echo 3/23 showed EF 55% severe LVH.   His amiodarone was stopped 8/23 due to elevated thyroid tests. Started on methimazole 5 bid and prednisone. He saw Dr Haroldine Laws 04/22/22 and was found to be back in afib. He was referred to the AF clinic to discuss rhythm options.   Today, patient reports that he feels well despite being in afib. He only notices his arrhythmia when he tries to exert himself, he fatigues more quickly. There were no specific triggers that he could identify.   Today, he denies symptoms of palpitations, chest pain, shortness of breath, orthopnea, PND, lower extremity edema, dizziness, presyncope, syncope, snoring, daytime somnolence, bleeding, or neurologic sequela. The patient is tolerating medications without difficulties and is otherwise without complaint today.     Atrial Fibrillation Risk Factors:  he does have symptoms or diagnosis of sleep apnea. he is compliant with CPAP therapy. he does not have a history of rheumatic fever.   he has a BMI of Body mass index is 40.16 kg/m.Marland Kitchen Filed Weights   05/21/22 0953  Weight: (!) 165.8 kg    Family History  Problem Relation Age of Onset   Hypertension Mother    Hypertension Father    Diabetes Father    Diabetes Sister    Hypertension Brother    Kidney disease Paternal Grandmother        ESRD   Coronary artery disease Neg Hx    Stroke Neg Hx    Cancer Neg Hx      Atrial Fibrillation Management history:  Previous antiarrhythmic drugs: amiodarone  Previous cardioversions: 01/2021, 06/2021 Previous ablations: none CHADS2VASC score: 6 Anticoagulation history: Eliquis   Past Medical History:  Diagnosis Date   CKD (chronic kidney disease) stage 3, GFR 30-59 ml/min (HCC)    baseline Cr 1.5-1.7   Decreased visual acuity    Left eye, resolved - hypertensive retinopathy   Diabetes mellitus 2012   Type II   Hilar adenopathy    on CT scan 02/2010, on rpt scan stable/improved.   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   Systolic murmur    Vitamin D deficiency    Past Surgical History:  Procedure Laterality Date   AMPUTATION Right 09/17/2021   Procedure: First ray amputation;  Surgeon: Newt Minion, MD;  Location: [place];  Service: Orthopedics;  Laterality: Right;   APPLICATION OF WOUND VAC Right 10/10/2021   Procedure: APPLICATION OF WOUND VAC;  Surgeon: Newt Minion, MD;  Location: [place];  Service: Orthopedics;  Laterality: Right;   CARDIOVERSION N/A 01/31/2021   Procedure:  CARDIOVERSION;  Surgeon: Jolaine Artist, MD;  Location: [place];  Service: Cardiovascular;  Laterality: N/A;   CARDIOVERSION N/A 07/04/2021   Procedure: CARDIOVERSION;  Surgeon: Jolaine Artist, MD;  Location: [facility] ENDOSCOPY;  Service: Cardiovascular;  Laterality: N/A;   hospitalization  11/2008   malignant HTN, nl SPEP/UPEP, neg ANCA panel, nl C3/4, neg anti GBM Ab, neg Hep A/B/C, nl renal US, nl PTH, neg HIV   I & D EXTREMITY Right 10/10/2021   Procedure: RIGHT FOOT DEBRIDEMENT;  Surgeon: Newt Minion, MD;  Location: [place];  Service: Orthopedics;  Laterality: Right;   RIGHT/LEFT HEART CATH AND CORONARY ANGIOGRAPHY N/A 01/29/2021   Procedure: RIGHT/LEFT HEART CATH AND CORONARY ANGIOGRAPHY;  Surgeon: Jolaine Artist, MD;  Location: [place] CV LAB;  Service: Cardiovascular;  Laterality: N/A;   TEE WITHOUT CARDIOVERSION N/A 01/31/2021   Procedure: TRANSESOPHAGEAL ECHOCARDIOGRAM (TEE);  Surgeon: Jolaine Artist, MD;  Location[facility] ENDOSCOPY;  Service: Cardiovascular;  Laterality: N/A;   US ECHOCARDIOGRAPHY  11/2008   LVsys fxn EF 50%, mild MR, normal LV size, neg ANA, neg cryoglobulins   US ECHOCARDIOGRAPHY  2011   severe LVH, EF 40%, LA mildly dilated, PA pressure moderately increased    Current Outpatient Medications  Medication Sig Dispense Refill   amLODipine (NORVASC) 5 MG tablet Take 1 tablet (5 mg total) by mouth daily. 90 tablet 3   apixaban (ELIQUIS) 5 MG TABS tablet Take 1 tablet (5 mg total) by mouth 2 (two) times daily. 180 tablet 3   atorvastatin (LIPITOR) 20 MG tablet Take 1 tablet (20 mg total) by mouth at bedtime. 90 tablet 3   Continuous Blood Gluc Sensor (FREESTYLE LIBRE 3 SENSOR) MISC 1 each by Does not apply route every 14 (fourteen) days. 6 each 3   ENTRESTO 97-103 MG Take 1 tablet by mouth twice daily 60 tablet 11   furosemide (LASIX) 20 MG tablet Take 2 tablets (40 mg total) by mouth daily. 90 tablet 3   isosorbide-hydrALAZINE (BIDIL) 20-37.5 MG tablet  Take 2 tablets by mouth 3 (three) times daily. 180 tablet 3   JARDIANCE 10 MG TABS tablet Take 1 tablet by mouth once daily 90 tablet 3   methimazole (TAPAZOLE) 5 MG tablet Take 1 tablet (5 mg total) by mouth daily. 45 tablet 1   metoprolol succinate (TOPROL-XL) 100 MG 24 hr tablet TAKE 1 TABLET BY MOUTH ONCE DAILY (TAKE  WITH  OR  IMMEDIATELY  FOLLOWING  A  MEAL) 90 tablet 1   pantoprazole (PROTONIX) 40 MG tablet Take 1 tablet (40 mg total) by mouth daily. 90 tablet 3   predniSONE (DELTASONE) 10 MG tablet Take 1 tablet (10 mg total) by mouth daily with breakfast. 45 tablet 1   tirzepatide (MOUNJARO) 12.5 MG/0.5ML Pen Inject 12.5 mg into the skin once a week. 2 mL 5   No current facility-administered medications for this encounter.    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    Comment: [place]  Tobacco Use   Smoking status: Former    Types: Cigars   Smokeless tobacco: Never   Tobacco comments:    monthly, when going out  Vaping Use   Vaping Use: Never used  Substance and Sexual Activity   Alcohol use: Yes    Comment: Occasional   Drug use: No   Sexual activity: Not on file  Other Topics Concern   Not on file  Social History Narrative   Newly married, 2012, 1 daughter, 1 son   No injectable steroid cycles   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, 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)   Regular exercise-yes   Social Determinants of Health   Financial Resource Strain: Low Risk  (01/27/2021)   Overall Financial Resource Strain (CARDIA)    Difficulty of Paying Living Expenses: Not hard at all  Food Insecurity: No Food Insecurity (02/11/2021)   Hunger Vital Sign     Worried About Running Out of Food in the Last Year: Never true    Ran Out of Food in the Last Year: Never true  Transportation Needs: No Transportation Needs (01/27/2021)   PRAPARE - Administrator, Civil Service (Medical): No    Lack of Transportation (Non-Medical): No  Physical Activity: Not on file  Stress: Not on file  Social Connections: Not on file  Intimate Partner Violence: Not on file     ROS- All systems are reviewed and negative except as per the HPI above.  Physical Exam: Vitals:   05/21/22 0953  BP: (!) 138/90  Pulse: 94  Weight: (!) 165.8 kg  Height: 6\' 8"  (2.032 m)    GEN- The patient is a well appearing obese male, alert and oriented x 3 today.   Head- normocephalic, atraumatic Eyes-  Sclera clear, conjunctiva pink Ears- hearing intact Oropharynx- clear Neck- supple  Lungs- Clear to ausculation bilaterally, normal work of breathing Heart- irregular rate and rhythm, no murmurs, rubs or gallops  GI- soft, NT, ND, + BS Extremities- no clubbing, cyanosis, or edema MS- no significant deformity or atrophy Skin- no rash or lesion Psych- euthymic mood, full affect Neuro- strength and sensation are intact  Wt Readings from Last 3 Encounters:  05/21/22 (!) 165.8 kg  05/06/22 (!) 169.4 kg  04/22/22 (!) 170.6 kg    EKG today demonstrates  Coarse afib Vent. rate 94 BPM PR interval * ms QRS duration 96 ms QT/QTcB 392/490 ms  Echo 04/03/22 demonstrated   1. Left ventricular ejection fraction, by estimation, is 55 to 60%. Left  ventricular ejection fraction by PLAX is 56 %. The left ventricle has  normal function. The left ventricle has no regional wall motion  abnormalities. There is severe left ventricular hypertrophy. Left ventricular diastolic parameters are indeterminate. Elevated left ventricular end-diastolic pressure.   2. Right ventricular systolic function is normal. The right ventricular  size is normal.   3. Left atrial size was  moderately dilated.   4. The mitral valve is normal in structure. No evidence of mitral valve  regurgitation. No evidence of mitral stenosis.   5. The aortic valve  is tricuspid. Aortic valve regurgitation is not  visualized. No aortic stenosis is present.   6. Compared with the echo 06/2021, aortic root has increased in size from  3.9 cm to 4.2 cm. The ascending aorta has increased from 3.7 cm to 4.4 cm.  Aortic dilatation noted. There is mild dilatation of the aortic root,  measuring 42 mm. There is moderate  dilatation of the ascending aorta, measuring 44 mm.   7. The inferior vena cava is dilated in size with >50% respiratory  variability, suggesting right atrial pressure of 8 mmHg.   Epic records are reviewed at length today  CHA2DS2-VASc Score = 6  The patient's score is based upon: CHF History: 1 HTN History: 1 Diabetes History: 1 Stroke History: 2 Vascular Disease History: 1 Age Score: 0 Gender Score: 0       ASSESSMENT AND PLAN: 1. Persistent Atrial Fibrillation/atrial flutter The patient's CHA2DS2-VASc score is 6, indicating a 9.7% annual risk of stroke.   Patient back in persistent afib Failed amiodarone due to hyperthyroidism.  We discussed rhythm control options today. His baseline QT appears long for dofetilide. Class IC may not be ideal with CAD. Suspect Multaq may not be effective. He would prefer to avoid more medications if possible. Patient is interested in consultation with EP for ablation, will refer.  Thyroid function followed by Dr Cruzita Lederer.  2. Secondary Hypercoagulable State (ICD10:  D68.69) The patient is at significant risk for stroke/thromboembolism based upon his CHA2DS2-VASc Score of 6.  Continue Apixaban (Eliquis).   3. Obesity Body mass index is 40.16 kg/m. Lifestyle modification was discussed at length including regular exercise and weight reduction.  4. Obstructive sleep apnea The importance of adequate treatment of sleep apnea was discussed  today in order to improve our ability to maintain sinus rhythm long term. Encouraged compliance with CPAP therapy.   5. Chronic heart failure HFimEF EF 55-60% Fluid status appears stable today. Followed in the [facility].  6. HTN Stable, no changes today.  7. CAD Nonobstructive on LHC On statin No anginal symptoms.    Follow up with Dr Haroldine Laws as scheduled. Follow up with EP to establish care/discuss ablation.    [facility] [address] [place], [place] [address] [phone] 05/21/2022 10:16 AM

## 2022-05-22 NOTE — Telephone Encounter (Signed)
Called and lm on vm to advise that form is at front desk for pick up.

## 2022-06-04 ENCOUNTER — Encounter (HOSPITAL_COMMUNITY): Payer: Self-pay | Admitting: *Deleted

## 2022-06-05 ENCOUNTER — Other Ambulatory Visit (HOSPITAL_COMMUNITY): Payer: Self-pay | Admitting: Internal Medicine

## 2022-06-09 ENCOUNTER — Other Ambulatory Visit: Payer: Self-pay | Admitting: Internal Medicine

## 2022-06-09 ENCOUNTER — Telehealth (HOSPITAL_COMMUNITY): Payer: Self-pay

## 2022-06-09 DIAGNOSIS — E1122 Type 2 diabetes mellitus with diabetic chronic kidney disease: Secondary | ICD-10-CM

## 2022-06-09 MED ORDER — TIRZEPATIDE 12.5 MG/0.5ML ~~LOC~~ SOAJ: 12.5000 mg | SUBCUTANEOUS | 5 refills | Status: DC

## 2022-06-09 NOTE — Telephone Encounter (Signed)
Yes, sure - I sent a refill - he is now on 12.5 mg weekly. Philemon Kingdom MD

## 2022-06-09 NOTE — Telephone Encounter (Signed)
Divyansh called and stated he would like to have the [facility] called in for 52m.  We have not sent this in, but looks like  your office has.  Thanks!

## 2022-06-11 NOTE — Telephone Encounter (Signed)
T, At last visit, we decreased the dose of Ozempic as he was dropping his sugars too low.  I am ok with him staying on the 15 mg dose unless his sugars are low, as they were at last visit.  Can you please check with him?.  If needed, we may need to schedule an appointment if he has many issues to discuss.

## 2022-06-11 NOTE — Telephone Encounter (Signed)
He called again and asked about getting the 69m instead of 12.577m He states he is having trouble getting it, and wants to keep it at the higher dose. Can you help him with this? He has a lot of questions as well.

## 2022-06-11 NOTE — Telephone Encounter (Signed)
Lvm for pt to call back to confirm his bs are regulated on current dose.

## 2022-06-12 ENCOUNTER — Encounter (HOSPITAL_COMMUNITY): Payer: 59 | Admitting: Internal Medicine

## 2022-06-12 MED ORDER — TIRZEPATIDE 15 MG/0.5ML ~~LOC~~ SOAJ: 15.0000 mg | SUBCUTANEOUS | 5 refills | Status: DC

## 2022-06-12 NOTE — Addendum Note (Signed)
Addended by: Lauralyn Primes on: 06/12/2022 02:09 PM   Modules accepted: Orders

## 2022-06-12 NOTE — Telephone Encounter (Signed)
Pt contacted office and advise he has not been having any lows. Rx sent to pharmacy.

## 2022-06-18 ENCOUNTER — Ambulatory Visit: Payer: 59 | Attending: Cardiology | Admitting: Cardiology

## 2022-06-18 ENCOUNTER — Encounter: Payer: Self-pay | Admitting: Cardiology

## 2022-06-18 VITALS — BP 140/92 | HR 114 | Ht >= 80 in | Wt 375.0 lb

## 2022-06-18 DIAGNOSIS — D6869 Other thrombophilia: Secondary | ICD-10-CM

## 2022-06-18 DIAGNOSIS — I4819 Other persistent atrial fibrillation: Secondary | ICD-10-CM | POA: Diagnosis not present

## 2022-06-18 NOTE — Patient Instructions (Signed)
Medication Instructions:  Your physician recommends that you continue on your current medications as directed. Please refer to the Current Medication list given to you today.  *If you need a refill on your cardiac medications before your next appointment, please call your pharmacy*   Lab Work: Pre procedure labs -- see procedure instruction letter:  BMP & CBC  If you have labs (blood work) drawn today and your tests are completely normal, you will receive your results only by: [place] (if you have MyChart) OR A paper copy in the mail If you have any lab test that is abnormal or we need to change your treatment, we will call you to review the results.   Testing/Procedures: Your physician has requested that you have cardiac CT within 7 days PRIOR to your ablation. Cardiac computed tomography (CT) is a painless test that uses an x-ray machine to take clear, detailed pictures of your heart.  Please follow instruction below located under "other instructions". You will get a call from our office to schedule the date for this test.  Your physician has recommended that you have an ablation. Catheter ablation is a medical procedure used to treat some cardiac arrhythmias (irregular heartbeats). During catheter ablation, a long, thin, flexible tube is put into a blood vessel in your groin (upper thigh), or neck. This tube is called an ablation catheter. It is then guided to your heart through the blood vessel. Radio frequency waves destroy small areas of heart tissue where abnormal heartbeats may cause an arrhythmia to start.  EP scheduler will call you to arrange this procedure sometime this summer.   Follow-Up: At [facility], you and your health needs are our priority.  As part of our continuing mission to provide you with exceptional heart care, we have created designated Provider Care Teams.  These Care Teams include your primary Cardiologist (physician) and Advanced Practice Providers  (APPs -  Physician Assistants and Nurse Practitioners) who all work together to provide you with the care you need, when you need it.  Your next appointment:   1 month(s) after your ablation  The format for your next appointment:   In Person  Provider:   AFib clinic   Thank you for choosing [facility]!!   Trinidad Curet, RN [phone]    Other Instructions   Cardiac Ablation Cardiac ablation is a procedure to destroy (ablate) some heart tissue that is sending bad signals. These bad signals cause problems in heart rhythm. The heart has many areas that make these signals. If there are problems in these areas, they can make the heart beat in a way that is not normal. Destroying some tissues can help make the heart rhythm normal. Tell your doctor about: Any allergies you have. All medicines you are taking. These include vitamins, herbs, eye drops, creams, and over-the-counter medicines. Any problems you or family members have had with medicines that make you fall asleep (anesthetics). Any blood disorders you have. Any surgeries you have had. Any medical conditions you have, such as kidney failure. Whether you are pregnant or may be pregnant. What are the risks? This is a safe procedure. But problems may occur, including: Infection. Bruising and bleeding. Bleeding into the chest. Stroke or blood clots. Damage to nearby areas of your body. Allergies to medicines or dyes. The need for a pacemaker if the normal system is damaged. Failure of the procedure to treat the problem. What happens before the procedure? Medicines Ask your doctor about: Changing or  stopping your normal medicines. This is important. Taking aspirin and ibuprofen. Do not take these medicines unless your doctor tells you to take them. Taking other medicines, vitamins, herbs, and supplements. General instructions Follow instructions from your doctor about what you cannot eat or drink. Plan to have  someone take you home from the hospital or clinic. If you will be going home right after the procedure, plan to have someone with you for 24 hours. Ask your doctor what steps will be taken to prevent infection. What happens during the procedure?  An IV tube will be put into one of your veins. You will be given a medicine to help you relax. The skin on your neck or groin will be numbed. A cut (incision) will be made in your neck or groin. A needle will be put through your cut and into a large vein. A tube (catheter) will be put into the needle. The tube will be moved to your heart. Dye may be put through the tube. This helps your doctor see your heart. Small devices (electrodes) on the tube will send out signals. A type of energy will be used to destroy some heart tissue. The tube will be taken out. Pressure will be held on your cut. This helps stop bleeding. A bandage will be put over your cut. The exact procedure may vary among doctors and hospitals. What happens after the procedure? You will be watched until you leave the hospital or clinic. This includes checking your heart rate, breathing rate, oxygen, and blood pressure. Your cut will be watched for bleeding. You will need to lie still for a few hours. Do not drive for 24 hours or as long as your doctor tells you. Summary Cardiac ablation is a procedure to destroy some heart tissue. This is done to treat heart rhythm problems. Tell your doctor about any medical conditions you may have. Tell him or her about all medicines you are taking to treat them. This is a safe procedure. But problems may occur. These include infection, bruising, bleeding, and damage to nearby areas of your body. Follow what your doctor tells you about food and drink. You may also be told to change or stop some of your medicines. After the procedure, do not drive for 24 hours or as long as your doctor tells you. This information is not intended to replace advice  given to you by your health care provider. Make sure you discuss any questions you have with your health care provider. Document Revised: 07/04/2021 Document Reviewed: 03/16/2019 Elsevier Patient Education  [place].

## 2022-06-18 NOTE — Progress Notes (Signed)
Electrophysiology Office Note   Date:  06/18/2022   ID:  Randy [name], DOB 01/29/1971, MRN [id number]  PCP:  Pcp, No  Cardiologist:  Dr. Haroldine Laws Primary Electrophysiologist:  Randy Harmes Meredith Leeds, MD    Chief Complaint: Persistent AF   History of Present Illness: Randy [name] is a 52 y.o. male who is being seen today for the evaluation of persistent AF at the request of Fenton, Clint R, PA. Presenting today for electrophysiology evaluation.  Randy [name] is a 52 y.o. male with a history of HFrEF, HTN, CKD, HLD, DM, CVA, OSA, CAD, atrial fibrillation who presents for consultation to discuss potential ablation for persistent AF.   Past medical history includes: Admission in 12/02/2019 with newly diagnosed A fib + ischemic CVA. He did not meet criteria for IV tPA. MRI showed large acute focal infarct, left frontal lobe predominantly ACA distribution. Echocardiogram obtained with bubble study showing no cardiac etiology for stroke. LVEF was 40 to 45%. Placed on Toprol XL and Eliquis for a CHDAS2VASC score of 6.    Followed at [facility] for cardiology services. Saw Norberto Sorenson on 12/12/2019. Set up for sleep study.    Admitted 10/22 with A/C HFrEF and A fib RVR.  Echo showed EF down to 30-35% with speckled appears concerning for amyloid. Diuresed with IV lasix and placed on Amio drip. Once diuresed had TEE-DC-CV with restoration of NSR. Had RHC/LHC with min obs CAD, elevated filling pressures and normal cardiac output.  Diuresed 40 pounds. Repeat echo 3/23 showed EF 55% severe LVH.   His amiodarone was stopped 8/23 due to elevated thyroid tests. Started on methimazole 5 bid and prednisone. He saw Dr Haroldine Laws 04/22/22 and was found to be back in afib. He was seen in the AF clinic to discussion treatment options and sent today to discuss ablation. He is less interested in additional medication therapy and also with prolonged QT less desirable candidate for Tikosyn.   Today, patient  reports that he feels well despite being in afib. He is currently in AF. He admits to being noncompliant with CPAP. He last tried one month ago but admits to not using it consistently over the past several years. He knows he is currently in AF and remembers the difference after DCCV (also had significant diuresis). He felt great at that time and could work out / exercise without issue. When he is in AF he notices fatigue with exertion quicker than normal.   He is compliant with his Toprol and Eliquis.   Today, he denies symptoms of palpitations, chest pain, shortness of breath, orthopnea, PND, lower extremity edema, claudication, dizziness, presyncope, syncope, bleeding, or neurologic sequela. The patient is tolerating medications without difficulties.    Past Medical History:  Diagnosis Date   CKD (chronic kidney disease) stage 3, GFR 30-59 ml/min (HCC)    baseline Cr 1.5-1.7   Decreased visual acuity    Left eye, resolved - hypertensive retinopathy   Diabetes mellitus 2012   Type II   Hilar adenopathy    on CT scan 02/2010, on rpt scan stable/improved.   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  Systolic murmur    Vitamin D deficiency    Past Surgical History:  Procedure Laterality Date   AMPUTATION Right 09/17/2021   Procedure: First ray amputation;  Surgeon: Newt Minion, MD;  Location: [place];  Service: Orthopedics;  Laterality: Right;   APPLICATION OF WOUND VAC Right 10/10/2021   Procedure: APPLICATION OF WOUND VAC;  Surgeon: Newt Minion, MD;  Location: [place];  Service: Orthopedics;  Laterality: Right;   CARDIOVERSION N/A 01/31/2021   Procedure: CARDIOVERSION;  Surgeon: Jolaine Artist, MD;  Location: [place];  Service: Cardiovascular;  Laterality: N/A;   CARDIOVERSION N/A 07/04/2021   Procedure: CARDIOVERSION;  Surgeon: Jolaine Artist, MD;  Location: [name] ENDOSCOPY;  Service: Cardiovascular;  Laterality: N/A;   hospitalization  11/2008   malignant HTN, nl SPEP/UPEP, neg ANCA panel, nl C3/4, neg anti GBM Ab, neg Hep A/B/C, nl renal US, nl PTH, neg HIV   I & D EXTREMITY Right 10/10/2021   Procedure: RIGHT FOOT DEBRIDEMENT;  Surgeon: Newt Minion, MD;  Location: [place];  Service: Orthopedics;  Laterality: Right;   RIGHT/LEFT HEART CATH AND CORONARY ANGIOGRAPHY N/A 01/29/2021   Procedure: RIGHT/LEFT HEART CATH AND CORONARY ANGIOGRAPHY;  Surgeon: Jolaine Artist, MD;  Location: [place] CV LAB;  Service: Cardiovascular;  Laterality: N/A;   TEE WITHOUT CARDIOVERSION N/A 01/31/2021   Procedure: TRANSESOPHAGEAL ECHOCARDIOGRAM (TEE);  Surgeon: Jolaine Artist, MD;  Location: [facility] ENDOSCOPY;  Service: Cardiovascular;  Laterality: N/A;   US ECHOCARDIOGRAPHY  11/2008   LVsys fxn EF 50%, mild MR, normal LV size, neg ANA, neg cryoglobulins   US ECHOCARDIOGRAPHY  2011   severe LVH, EF 40%, LA mildly dilated, PA pressure moderately increased     Current Outpatient Medications  Medication Sig Dispense Refill   amLODipine (NORVASC) 5 MG tablet Take 1 tablet (5 mg total) by mouth daily. 90 tablet 3   apixaban (ELIQUIS) 5 MG TABS tablet Take 1 tablet (5 mg total) by mouth 2 (two) times daily. 180 tablet 3   atorvastatin (LIPITOR) 20 MG tablet Take 1 tablet (20 mg total) by mouth at bedtime. 90 tablet 3   Continuous Blood Gluc Sensor (FREESTYLE LIBRE 3 SENSOR) MISC 1 each by Does not apply route every 14 (fourteen) days. 6 each 3   ENTRESTO 97-103 MG Take 1 tablet by mouth twice daily 60 tablet 11   furosemide (LASIX) 20 MG tablet Take 2 tablets (40 mg total) by mouth daily. 90 tablet 3   isosorbide-hydrALAZINE (BIDIL) 20-37.5 MG tablet Take 2 tablets by mouth 3 (three)  times daily. 180 tablet 3   JARDIANCE 10 MG TABS tablet Take 1 tablet by mouth once daily 90 tablet 3   methimazole (TAPAZOLE) 5 MG tablet Take 1 tablet (5 mg total) by mouth daily. 45 tablet 1   metoprolol succinate (TOPROL-XL) 100 MG 24 hr tablet TAKE 1 TABLET BY MOUTH ONCE DAILY (TAKE  WITH  OR  IMMEDIATELY  FOLLOWING  A  MEAL) 90 tablet 1   pantoprazole (PROTONIX) 40 MG tablet Take 1 tablet (40 mg total) by mouth daily. 90 tablet 3   predniSONE (DELTASONE) 10 MG tablet Take 1 tablet (10 mg total) by mouth daily with breakfast. 45 tablet 1   tirzepatide (MOUNJARO) 15 MG/0.5ML Pen Inject 15 mg into the skin once a week. 2 mL 5   No current facility-administered medications for this visit.    Allergies:   Fish-derived products   Social History:  The patient  reports  that he has quit smoking. His smoking use included cigars. He has never used smokeless tobacco. He reports current alcohol use. He reports that he does not use drugs. He previously played in the [employer].  Family History:  The patient's family history includes Diabetes in his father and sister; Hypertension in his brother, father, and mother; Kidney disease in his paternal grandmother.    ROS:  Please see the history of present illness.   Otherwise, review of systems is positive for none.   All other systems are reviewed and negative.    PHYSICAL EXAM: VS:  BP (!) 140/92   Pulse (!) 114   Ht 6' 8"$  (2.032 m)   Wt (!) 375 lb (170.1 kg)   SpO2 98%   BMI 41.20 kg/m  , BMI Body mass index is 41.2 kg/m. GEN: Well nourished, well developed, in no acute distress. Obese habitus noted. HEENT: normal  Neck: no JVD, carotid bruits, or masses Cardiac: irregular rhythm noted; no murmurs, rubs, or gallops,no edema  Respiratory:  clear to auscultation bilaterally, normal work of breathing GI: soft, nontender, nondistended, + BS MS: no deformity or atrophy  Skin: warm and dry,  Neuro:  Strength and sensation are intact Psych: euthymic  mood, full affect  EKG:  EKG 05/21/22 shows AF HR 94 with prolonged Qtc 490 ms.   Recent Labs: 09/17/2021: Magnesium 2.2 03/25/2022: Hemoglobin 12.4; Platelets 279 04/22/2022: ALT 34; B Natriuretic Peptide 510.3; BUN 30; Creatinine, Ser 1.64; Potassium 3.8; Sodium 139 05/06/2022: TSH 0.03    Lipid Panel     Component Value Date/Time   CHOL 136 05/06/2022 1230   TRIG 52.0 05/06/2022 1230   HDL 68.90 05/06/2022 1230   CHOLHDL 2 05/06/2022 1230   VLDL 10.4 05/06/2022 1230   LDLCALC 57 05/06/2022 1230   LDLDIRECT 79.0 11/28/2014 1705     Wt Readings from Last 3 Encounters:  06/18/22 (!) 375 lb (170.1 kg)  05/21/22 (!) 365 lb 9.6 oz (165.8 kg)  05/06/22 (!) 373 lb 6.4 oz (169.4 kg)      Other studies Reviewed: Additional studies/ records that were reviewed today include: Echocardiogram 04/03/22: severe LVH; EF 55-60%.   ASSESSMENT AND PLAN:  1.  Atrial fibrillation - persistent  - Discussed treatment options including medication vs ablation. After discussion of risks vs benefits, patient would like to proceed with ablation for treatment of persistent AF. We Randy [name] arrange scheduling for procedure.  2. Obesity   - Discussed necessity for lifestyle changes including moderate exercise and dietary changes to help assist in weight loss. He is currently on Mounjaro which Randy Silbaugh likely assist in this but emphasis placed on lifestyle changes to assist in lower AF burden.  3. OSA  - Discussed necessity for compliance with CPAP mask that he admits to not using regularly. He is going back to his sleep study doctor in [place] to try and obtain a different mask that is less cumbersome.   4. Secondary Hypercoagulable State (ICD10:  D68.69) - The patient is at significant risk for stroke/thromboembolism based upon his CHA2DS2-VASc Score of 6.  Continue Apixaban (Eliquis).    6. Chronic heart failure HFpEF EF 55-60% Fluid status appears stable today. Followed in the [facility]   7.  HTN Stable, no changes today.   8. CAD Nonobstructive on LHC On statin No anginal symptoms.    Current medicines are reviewed at length with the patient today.   The patient does not have concerns regarding his medicines.  The following changes  were made today:  none  Labs/ tests ordered today include: None No orders of the defined types were placed in this encounter.    Disposition:   Kamoria Lucien be scheduled for ablation.   Signed, Randy Lauf Meredith Leeds, MD  06/18/2022 10:39 AM     [facility] [address] [place] [address] [phone] (office) [phone] (fax)  I have seen and examined this patient with Randy [name].  Agree with above, note added to reflect my findings.  Presents to EP clinic for workup of his atrial fibrillation referred by Randy [name].  He has weakness and fatigue from his atrial fibrillation.  He has had a longstanding history of atrial fibrillation and had stroke in the past.  He also has a history of systolic heart failure, though his ejection fraction is improved.  He was previously on amiodarone and felt improved with quite a bit less fatigue and shortness of breath when he was in rhythm.  Unfortunately, he had thyroid dysfunction and amiodarone was stopped.  He has gone back into atrial fibrillation.  GEN: Well nourished, well developed, in no acute distress  HEENT: normal  Neck: no JVD, carotid bruits, or masses Cardiac: Irregular; no murmurs, rubs, or gallops,no edema  Respiratory:  clear to auscultation bilaterally, normal work of breathing GI: soft, nontender, nondistended, + BS MS: no deformity or atrophy  Skin: warm and dry Neuro:  Strength and sensation are intact Psych: euthymic mood, full affect   Persistent atrial fibrillation: Currently on Eliquis.  CHA2DS2-VASc of 6.  Unfortunately he has continued to have episodes of atrial fibrillation making him feel weak and fatigued.  He would benefit from rhythm  control.  He is not a good candidate for multiple medications due to prolonged QT, heart failure, and thyroid dysfunction on amiodarone.  Due to that, we Haylynn Pha plan for ablation.  Risk and benefits have been discussed.  He understands the risks and is agreed to the procedure.  Enez Monahan discuss with his primary cardiologist. Risk, benefits, and alternatives to EP study and radiofrequency ablation for afib were also discussed in detail today. These risks include but are not limited to stroke, bleeding, vascular damage, tamponade, perforation, damage to the esophagus, lungs, and other structures, pulmonary vein stenosis, worsening renal function, and death. The patient understands these risk and wishes to proceed.  We Derinda Bartus therefore proceed with catheter ablation at the next available time.  Carto, ICE, anesthesia are requested for the procedure.  Zadiel Leyh also obtain CT PV protocol prior to the procedure to exclude LAA thrombus and further evaluate atrial anatomy.  Second hypercoagulable state: Currently on Eliquis for atrial fibrillation Chronic systolic heart failure: Ejection fraction has improved.  Plan for rhythm control with ablation.  Plan per primary cardiology. Obesity: Diet and exercise encouraged3 Body mass index is 41.2 kg/m.  Obstructive sleep apnea: CPAP compliance encouraged  Jordell Outten M. Lillyan Hitson MD 06/18/2022 10:39 AM

## 2022-06-24 ENCOUNTER — Telehealth: Payer: Self-pay | Admitting: Cardiology

## 2022-06-24 DIAGNOSIS — I4819 Other persistent atrial fibrillation: Secondary | ICD-10-CM

## 2022-06-24 NOTE — Telephone Encounter (Signed)
  Pt is calling to f/u to schedule his ablation procedure

## 2022-06-26 ENCOUNTER — Ambulatory Visit: Payer: 59 | Admitting: Family

## 2022-06-26 DIAGNOSIS — L97511 Non-pressure chronic ulcer of other part of right foot limited to breakdown of skin: Secondary | ICD-10-CM

## 2022-06-30 NOTE — Telephone Encounter (Signed)
LM for pt to call back.

## 2022-07-02 NOTE — Telephone Encounter (Signed)
Pt is returning call.  

## 2022-07-02 NOTE — Addendum Note (Signed)
Addended by: Carylon Perches on: 07/02/2022 10:47 AM   Modules accepted: Orders

## 2022-07-03 ENCOUNTER — Encounter: Payer: Self-pay | Admitting: Family

## 2022-07-03 NOTE — Progress Notes (Signed)
Office Visit Note   Patient: Randy [name]           Date of Birth: [birth year]           MRN: [id number] Visit Date: 06/26/2022              Requested by: No referring provider defined for this encounter. PCP: Pcp, No  Chief Complaint  Patient presents with   Right Foot - Wound Check      HPI: The patient is a 52 year old gentleman seen in follow-up.  He states he has had some opening up of a new ulcer over the dorsum of his right foot.  Today he is wondering if we have any Kerecis graft that we can apply.  He is been doing dry dressing changes  No fevers no chills  Has return to work with regular shoewear  Assessment & Plan: Visit Diagnoses: No diagnosis found.  Plan: Donated graft applied to the dorsum of his foot over the ulcerative area he will leave this in place for the next 1 week instructed on dressing changes  Follow-Up Instructions: No follow-ups on file.   Ortho Exam  Patient is alert, oriented, no adenopathy, well-dressed, normal affect, normal respiratory effort. On examination of the right foot his first ray amputation incision is well-healed he has an open area just dorsal to this which is half dollar sized please see attached image.  This is superficial and filled in with granulation there is scant surrounding maceration no odor  Imaging: No results found.   Labs: Lab Results  Component Value Date   HGBA1C 5.9 (A) 05/06/2022   HGBA1C 10.1 (H) 09/14/2021   HGBA1C 6.3 (H) 01/27/2021   ESRSEDRATE 3 08/07/2020   REPTSTATUS 09/19/2021 FINAL 09/14/2021   CULT  09/14/2021    NO GROWTH 5 DAYS Performed at [facility], [place] [address], [place], [place] [address]      Lab Results  Component Value Date   ALBUMIN 3.4 (L) 04/22/2022   ALBUMIN 3.2 (L) 03/25/2022   ALBUMIN 3.2 (L) 11/21/2021    Lab Results  Component Value Date   MG 2.2 09/17/2021   MG 1.8 01/30/2021   MG 1.8 01/26/2021   No results found for: "VD25OH"  No results found  for: "PREALBUMIN"    Latest Ref Rng & Units 03/25/2022    3:31 PM 11/21/2021    4:15 PM 09/19/2021    4:23 AM  CBC EXTENDED  WBC 4.0 - 10.5 K/uL 11.9  8.4  16.4   RBC 4.22 - 5.81 MIL/uL 5.29  4.46  4.31   Hemoglobin 13.0 - 17.0 g/dL 12.4  11.1  11.4   HCT 39.0 - 52.0 % 40.1  36.4  35.6   Platelets 150 - 400 K/uL 279  303  290      There is no height or weight on file to calculate BMI.  Orders:  No orders of the defined types were placed in this encounter.  No orders of the defined types were placed in this encounter.    Procedures: No procedures performed  Clinical Data: No additional findings.  ROS:  All other systems negative, except as noted in the HPI. Review of Systems  Objective: Vital Signs: There were no vitals taken for this visit.  Specialty Comments:  No specialty comments available.  PMFS History: Patient Active Problem List   Diagnosis Date Noted   Hypercoagulable state due to persistent atrial fibrillation ([place]) 05/21/2022   Osteomyelitis of great  toe of right foot ([place])    Cutaneous abscess of right foot    Cellulitis of right foot 09/15/2021   Leg pain, medial, right 09/14/2021   Permanent atrial fibrillation ([place]) 01/27/2021   SIRS (systemic inflammatory response syndrome) ([place]) 01/27/2021   HTN (hypertension) 01/27/2021   Hypokalemia [id number]   Acute systolic CHF (congestive heart failure) ([place]) 01/27/2021   New onset of congestive heart failure ([place]) 01/26/2021   Acute CVA (cerebrovascular accident) ([place]) 08/07/2020   Acute upper respiratory infection 11/27/2014   Numbness of finger 09/19/2011   Hilar adenopathy    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[place]) [id number]   Type 2 diabetes mellitus with complication, without long-term current use of insulin ([place]) 04/23/2010   Renal insufficiency 03/21/2008   VISUAL ACUITY, DECREASED, LEFT EYE 03/19/2008   BELL'S PALSY, LEFT 01/30/2008   Morbid obesity ([place]) 01/20/2008   Malignant hypertension with heart failure  and stage 3 chronic kidney disease ([place]) 01/20/2008   Past Medical History:  Diagnosis Date   CKD (chronic kidney disease) stage 3, GFR 30-59 ml/min (HCC)    baseline Cr 1.5-1.7   Decreased visual acuity    Left eye, resolved - hypertensive retinopathy   Diabetes mellitus 2012   Type II   Hilar adenopathy    on CT scan 02/2010, on rpt scan stable/improved.   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   Systolic murmur    Vitamin D deficiency     Family History  Problem Relation Age of Onset   Hypertension Mother    Hypertension Father    Diabetes Father    Diabetes Sister    Hypertension Brother    Kidney disease Paternal Grandmother        ESRD   Coronary artery disease Neg Hx    Stroke Neg Hx    Cancer Neg Hx     Past Surgical History:  Procedure Laterality Date   AMPUTATION Right 09/17/2021   Procedure: First ray amputation;  Surgeon: Newt Minion, MD;  Location: [place];  Service: Orthopedics;  Laterality: Right;   APPLICATION OF WOUND VAC Right 10/10/2021   Procedure: APPLICATION OF WOUND VAC;  Surgeon: Newt Minion, MD;  Location: [place];  Service: Orthopedics;  Laterality: Right;   CARDIOVERSION N/A 01/31/2021   Procedure: CARDIOVERSION;  Surgeon: Jolaine Artist, MD;  Location: [place];  Service: Cardiovascular;  Laterality: N/A;   CARDIOVERSION N/A 07/04/2021   Procedure: CARDIOVERSION;  Surgeon: Jolaine Artist, MD;  Location: [facility] ENDOSCOPY;  Service: Cardiovascular;  Laterality: N/A;   hospitalization  11/2008   malignant HTN, nl SPEP/UPEP, neg ANCA panel, nl C3/4, neg anti GBM Ab, neg Hep A/B/C, nl renal US, nl PTH, neg HIV   I & D EXTREMITY Right 10/10/2021    Procedure: RIGHT FOOT DEBRIDEMENT;  Surgeon: Newt Minion, MD;  Location: [place];  Service: Orthopedics;  Laterality: Right;   RIGHT/LEFT HEART CATH AND CORONARY ANGIOGRAPHY N/A 01/29/2021   Procedure: RIGHT/LEFT HEART CATH AND CORONARY ANGIOGRAPHY;  Surgeon: Jolaine Artist, MD;  Location: [place] CV LAB;  Service: Cardiovascular;  Laterality: N/A;   TEE WITHOUT CARDIOVERSION  N/A 01/31/2021   Procedure: TRANSESOPHAGEAL ECHOCARDIOGRAM (TEE);  Surgeon: Jolaine Artist, MD;  Location: [place];  Service: Cardiovascular;  Laterality: N/A;   US ECHOCARDIOGRAPHY  11/2008   LVsys fxn EF 50%, mild MR, normal LV size, neg ANA, neg cryoglobulins   US ECHOCARDIOGRAPHY  2011   severe LVH, EF 40%, LA mildly dilated, PA pressure moderately increased   Social History   Occupational History   Occupation: Tax Forensic psychologist    Comment: [employer]  Tobacco Use   Smoking status: Former    Types: Cigars   Smokeless tobacco: Never   Tobacco comments:    monthly, when going out  Media planner   Vaping Use: Never used  Substance and Sexual Activity   Alcohol use: Yes    Comment: Occasional   Drug use: No   Sexual activity: Not on file

## 2022-07-14 ENCOUNTER — Other Ambulatory Visit (HOSPITAL_COMMUNITY): Payer: Self-pay

## 2022-07-22 ENCOUNTER — Encounter: Payer: Self-pay | Admitting: Family

## 2022-07-22 ENCOUNTER — Ambulatory Visit: Payer: 59 | Admitting: Family

## 2022-07-22 DIAGNOSIS — L97511 Non-pressure chronic ulcer of other part of right foot limited to breakdown of skin: Secondary | ICD-10-CM | POA: Diagnosis not present

## 2022-07-22 DIAGNOSIS — Z9889 Other specified postprocedural states: Secondary | ICD-10-CM

## 2022-07-22 NOTE — Progress Notes (Signed)
Office Visit Note   Patient: Randy [name]           Date of Birth: [birth year]           MRN: [id number] Visit Date: 07/22/2022              Requested by: No referring provider defined for this encounter. 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  Did have some Kerecis placed at last visit states this came off with the first dressing change.  No new concerns voiced today.  Assessment & Plan: Visit Diagnoses: No diagnosis found.  Plan: Donated Kerecis sheet applied today.  Patient aware of dressing change instructions will change overlying gauze leave scaffold for the first week then will resume daily Dial soap cleansing dry dressings follow-up in 2 weeks  Follow-Up Instructions: No follow-ups on file.   Ortho Exam  Patient is alert, oriented, no adenopathy, well-dressed, normal affect, normal respiratory effort. On examination of the right foot he does have a dorsal foot ulcer there appears to be an epithelization island centrally the ulcer is 25 mm in diameter without any depth there is no surrounding erythema warmth or drainage  Imaging: No results found.   Labs: Lab Results  Component Value Date   HGBA1C 5.9 (A) 05/06/2022   HGBA1C 10.1 (H) 09/14/2021   HGBA1C 6.3 (H) 01/27/2021   ESRSEDRATE 3 08/07/2020   REPTSTATUS 09/19/2021 FINAL 09/14/2021   CULT  09/14/2021    NO GROWTH 5 DAYS Performed at [facility], [place] [address], [place], [place] [address]      Lab Results  Component Value Date   ALBUMIN 3.4 (L) 04/22/2022   ALBUMIN 3.2 (L) 03/25/2022   ALBUMIN 3.2 (L) 11/21/2021    Lab Results  Component Value Date   MG 2.2 09/17/2021   MG 1.8 01/30/2021   MG 1.8 01/26/2021   No results found for: "VD25OH"  No results found for:  "PREALBUMIN"    Latest Ref Rng & Units 03/25/2022    3:31 PM 11/21/2021    4:15 PM 09/19/2021    4:23 AM  CBC EXTENDED  WBC 4.0 - 10.5 K/uL 11.9  8.4  16.4   RBC 4.22 - 5.81 MIL/uL 5.29  4.46  4.31   Hemoglobin 13.0 - 17.0 g/dL 12.4  11.1  11.4   HCT 39.0 - 52.0 % 40.1  36.4  35.6   Platelets 150 - 400 K/uL 279  303  290      There is no height or weight on file to calculate BMI.  Orders:  No orders of the defined types were placed in this encounter.  No orders of the defined types were placed in this encounter.    Procedures: No procedures performed  Clinical Data: No additional findings.  ROS:  All other systems negative, except as noted in the HPI. Review of Systems  Objective: Vital Signs: There were no vitals taken for this visit.  Specialty Comments:  No specialty comments available.  PMFS History: Patient Active Problem List   Diagnosis Date Noted   Hypercoagulable state due to persistent atrial fibrillation ([place]) 05/21/2022   Osteomyelitis of great toe of right foot (HCC)    Cutaneous abscess of right foot  Cellulitis of right foot 09/15/2021   Leg pain, medial, right 09/14/2021   Permanent atrial fibrillation ([place]) 01/27/2021   SIRS (systemic inflammatory response syndrome) ([place]) 01/27/2021   HTN (hypertension) 01/27/2021   Hypokalemia [id number]   Acute systolic CHF (congestive heart failure) ([place]) 01/27/2021   New onset of congestive heart failure ([place]) 01/26/2021   Acute CVA (cerebrovascular accident) ([place]) 08/07/2020   Acute upper respiratory infection 11/27/2014   Numbness of finger 09/19/2011   Hilar adenopathy    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[place]) [id number]   Type 2 diabetes mellitus with complication, without long-term current use of insulin ([place]) 04/23/2010   Renal insufficiency 03/21/2008   VISUAL ACUITY, DECREASED, LEFT EYE 03/19/2008   BELL'S PALSY, LEFT 01/30/2008   Morbid obesity ([place]) 01/20/2008   Malignant hypertension with heart failure and  stage 3 chronic kidney disease ([place]) 01/20/2008   Past Medical History:  Diagnosis Date   CKD (chronic kidney disease) stage 3, GFR 30-59 ml/min (HCC)    baseline Cr 1.5-1.7   Decreased visual acuity    Left eye, resolved - hypertensive retinopathy   Diabetes mellitus 2012   Type II   Hilar adenopathy    on CT scan 02/2010, on rpt scan stable/improved.   History of Bell's palsy 12/2007   history, Left   History of headache    HTN (hypertension), malignant    previously on BC and goody powders for HA   Internal derangement of knee 12/2007   Left   Microalbuminuria    Morbid obesity ([place])    Stroke ([place])    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[place])    echo 2011 with nonischemic hypertensive cardiomyopathy   Systolic murmur    Vitamin D deficiency     Family History  Problem Relation Age of Onset   Hypertension Mother    Hypertension Father    Diabetes Father    Diabetes Sister    Hypertension Brother    Kidney disease Paternal Grandmother        ESRD   Coronary artery disease Neg Hx    Stroke Neg Hx    Cancer Neg Hx     Past Surgical History:  Procedure Laterality Date   AMPUTATION Right 09/17/2021   Procedure: First ray amputation;  Surgeon: Newt Minion, MD;  Location: [facility];  Service: Orthopedics;  Laterality: Right;   APPLICATION OF WOUND VAC Right 10/10/2021   Procedure: APPLICATION OF WOUND VAC;  Surgeon: Newt Minion, MD;  Location: [facility];  Service: Orthopedics;  Laterality: Right;   CARDIOVERSION N/A 01/31/2021   Procedure: CARDIOVERSION;  Surgeon: Jolaine Artist, MD;  Location: [facility];  Service: Cardiovascular;  Laterality: N/A;   CARDIOVERSION N/A 07/04/2021   Procedure: CARDIOVERSION;  Surgeon: Jolaine Artist, MD;  Location: [facility] ENDOSCOPY;  Service: Cardiovascular;  Laterality: N/A;   hospitalization  11/2008   malignant HTN, nl SPEP/UPEP, neg ANCA panel, nl C3/4, neg anti GBM Ab, neg Hep A/B/C, nl renal US, nl PTH, neg HIV   I & D EXTREMITY Right 10/10/2021    Procedure: RIGHT FOOT DEBRIDEMENT;  Surgeon: Newt Minion, MD;  Location: [facility];  Service: Orthopedics;  Laterality: Right;   RIGHT/LEFT HEART CATH AND CORONARY ANGIOGRAPHY N/A 01/29/2021   Procedure: RIGHT/LEFT HEART CATH AND CORONARY ANGIOGRAPHY;  Surgeon: Jolaine Artist, MD;  Location: [facility] CV LAB;  Service: Cardiovascular;  Laterality: N/A;   TEE WITHOUT CARDIOVERSION N/A 01/31/2021   Procedure: TRANSESOPHAGEAL ECHOCARDIOGRAM (TEE);  Surgeon: Jolaine Artist, MD;  Location:  [facility] ENDOSCOPY;  Service: Cardiovascular;  Laterality: N/A;   US ECHOCARDIOGRAPHY  11/2008   LVsys fxn EF 50%, mild MR, normal LV size, neg ANA, neg cryoglobulins   US ECHOCARDIOGRAPHY  2011   severe LVH, EF 40%, LA mildly dilated, PA pressure moderately increased   Social History   Occupational History   Occupation: Tax Forensic psychologist    Comment: [place]  Tobacco Use   Smoking status: Former    Types: Cigars   Smokeless tobacco: Never   Tobacco comments:    monthly, when going out  Media planner   Vaping Use: Never used  Substance and Sexual Activity   Alcohol use: Yes    Comment: Occasional   Drug use: No   Sexual activity: Not on file

## 2022-08-05 ENCOUNTER — Telehealth: Payer: Self-pay | Admitting: Cardiology

## 2022-08-05 DIAGNOSIS — E118 Type 2 diabetes mellitus with unspecified complications: Secondary | ICD-10-CM

## 2022-08-05 MED ORDER — MOUNJARO 12.5 MG/0.5ML ~~LOC~~ SOAJ: 12.5000 mg | SUBCUTANEOUS | 0 refills | Status: DC

## 2022-08-05 NOTE — Telephone Encounter (Signed)
Rx for Mounjaro 12.5 sent to [facility]

## 2022-08-05 NOTE — Telephone Encounter (Signed)
Pt c/o medication issue:  1. Name of Medication: tirzepatide (MOUNJARO) 15 MG/0.5ML Pen   2. How are you currently taking this medication (dosage and times per day)? Has not taken in two months   3. Are you having a reaction (difficulty breathing--STAT)? No   4. What is your medication issue? Patient is calling stating he has found a pharmacy that has this medication, but they only have the 12.5 MG due to it being on backorder. He has not had this medication in two months, so he is requesting a new prescription to accommodate this dosage [facility] [place] [facility]. Please advise.

## 2022-08-05 NOTE — Telephone Encounter (Signed)
[facility] [place] [facility] Mounjaro 12.5 mg. This pharamcy currently does not have the medication in 15 mg due to its on back order. Will send to PharmD.   [facility] [address] [place] [address]

## 2022-08-12 ENCOUNTER — Encounter: Payer: Self-pay | Admitting: Internal Medicine

## 2022-08-12 ENCOUNTER — Ambulatory Visit: Payer: 59 | Admitting: Internal Medicine

## 2022-08-12 VITALS — BP 140/90 | HR 80 | Ht >= 80 in | Wt 386.6 lb

## 2022-08-12 DIAGNOSIS — E0580 Other thyrotoxicosis without thyrotoxic crisis or storm: Secondary | ICD-10-CM

## 2022-08-12 DIAGNOSIS — E1159 Type 2 diabetes mellitus with other circulatory complications: Secondary | ICD-10-CM

## 2022-08-12 DIAGNOSIS — E1165 Type 2 diabetes mellitus with hyperglycemia: Secondary | ICD-10-CM

## 2022-08-12 DIAGNOSIS — T462X5A Adverse effect of other antidysrhythmic drugs, initial encounter: Secondary | ICD-10-CM | POA: Diagnosis not present

## 2022-08-12 LAB — T4, FREE: Free T4: 1.51 ng/dL (ref 0.60–1.60)

## 2022-08-12 LAB — T3, FREE: T3, Free: 3.1 pg/mL (ref 2.3–4.2)

## 2022-08-12 LAB — TSH: TSH: 0.88 u[IU]/mL (ref 0.35–5.50)

## 2022-08-12 LAB — POCT GLYCOSYLATED HEMOGLOBIN (HGB A1C): Hemoglobin A1C: 6.4 % — AB (ref 4.0–5.6)

## 2022-08-12 MED ORDER — PREDNISONE 5 MG PO TABS: 5.0000 mg | ORAL_TABLET | Freq: Every day | ORAL | 1 refills | Status: DC

## 2022-08-12 NOTE — Progress Notes (Signed)
Patient ID: Randy [name], male   DOB: 12/25/1970, 52 y.o.   MRN: [id number]  HPI: Randy [name] is a 52 y.o.-year-old male, referred by his cardiologist, Dr. 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  Interim history: No increased urination, blurry vision, nausea, chest pain. Also, denies tremors, palpitations, heat intolerance, unintentional weight loss, insomnia, anxiety. He has fluid retention related to his CHF.  He did not restart amiodarone since last visit. He will have a cardiac ablation in 09/2022. He has a R foot ulcer for which she sees orthopedics. He is doing a religious fast now-will finish next week.  He had blood sugars in the morning while doing the fast.  DM2: Reviewed his HbA1c levels: Lab Results  Component Value Date   HGBA1C 5.9 (A) 05/06/2022   HGBA1C 10.1 (H) 09/14/2021   HGBA1C 6.3 (H) 01/27/2021   HGBA1C 7.4 (H) 08/08/2020   HGBA1C 12.5 (H) 11/28/2014   HGBA1C 12.5 (H) 11/10/2013   HGBA1C 6.6 (H) 04/03/2011   HGBA1C 6.6 (H) 09/05/2010   HGBA1C (H) 03/12/2010    6.6 (NOTE)                                                                       According to the ADA Clinical Practice Recommendations for 2011, when HbA1c is used as a screening test:   >=6.5%   Diagnostic of Diabetes Mellitus           (if abnormal result  is confirmed)  5.7-6.4%   Increased risk of developing Diabetes Mellitus  References:Diagnosis and Classification of Diabetes Mellitus,Diabetes Care,2011,34(Suppl 1):S62-S69 and Standards of Medical Care in         Diabetes - 2011,Diabetes Care,2011,34  (Suppl 1):S11-S61.   He is on a regimen of: - Jardiance 10 mg before breakfast - Mounjaro 15 mg weekly - started 11/2021 >> 12.5 mg weekly - off x2 mo - just restarted last week Metformin stopped 2/2  CKD, AP. He has not been on insulin.  Pt checks his sugars every 2 days and they are: - am: 60s-70s >> 60s (fasting now - religious), 120-130 - 2h after b'fast: n/c - before lunch: n/c - 2h after lunch: 110-120 >> n/c - before dinner: n/c - 2h after dinner: n/c >> 160-175 - bedtime: n/c - nighttime: n/c Lowest sugar was 40 >> 70; he has hypoglycemia awareness at 40.  Highest sugar was 120 >> 170.  Glucometer:One Touch  Pt's meals are: - Breakfast: steak biscuit  - Lunch: eating out - Dinner: skips, or veggies - Snacks: used to drink gatorade, not anymore  - + CKD, last BUN/creatinine:  Lab Results  Component Value Date   BUN 30 (H) 04/22/2022   BUN 21 (H) 03/25/2022   CREATININE 1.64 (H) 04/22/2022   CREATININE 1.79 (H) 03/25/2022  He is on Entresto.  - + HL; last set of lipids: Lab Results  Component Value Date   CHOL 136 05/06/2022   HDL 68.90 05/06/2022   LDLCALC 57 05/06/2022   LDLDIRECT 79.0 11/28/2014   TRIG 52.0 05/06/2022   CHOLHDL 2 05/06/2022  He  is on Lipitor 20 mg daily.  - last eye exam was in 03/2022. No DR reportedly.   - no numbness and tingling in his feet. Last foot exam 03/24/2022: [name].  Pt has FH of DM - brother, sister, father.  Amiodarone-induced thyrotoxicosis:  Amiodarone was stopped 11/2021 due to thyrotoxicosis.  04/15/2022: following regimen started by Dr. Gala Romney: - Methimazole 5 mg twice a day - Prednisone 20 mg daily  04/2022: We decreased the doses: - Methimazole 5 mg once a day - Prednisone 10 mg daily  Reviewed TFTs: Lab Results  Component Value Date   TSH 0.03 (L) 05/06/2022   TSH 0.010 (L) 04/22/2022   TSH <0.010 (L) 03/25/2022   TSH <0.010 (L) 02/03/2022   TSH 0.103 (L) 11/21/2021   TSH 0.90 09/05/2010   TSH 1.345 03/13/2010   Lab Results  Component Value Date   FREET4 1.83 (H) 05/06/2022   FREET4 2.08 (H) 04/22/2022   FREET4 2.89 (H) 03/25/2022   FREET4 2.50 (H) 02/03/2022   FREET4 2.20 (H)  12/05/2021   Lab Results  Component Value Date   T3FREE 3.8 05/06/2022   T3FREE 2.3 04/22/2022   T3FREE 3.4 03/25/2022   T3FREE 2.0 02/03/2022   T3FREE 2.6 12/05/2021   Lab Results  Component Value Date   TSI <89 05/06/2022   No FH of thyroid cancer. (Wife has Graves ds). No h/o radiation tx to head or neck. No recent contrast studies. No herbal supplements. No Biotin use.   CTA neck (07/28/2020): Chronic moderate diffuse enlargement of the thyroid gland without significant heterogeneity or a discrete nodule and for which no imaging follow-up is recommended.  Pt denies: - feeling nodules in neck - hoarseness - dysphagia - choking  She also has a history of significant HTN, obesity- 450 lbs at his highest weight.  ROS: + weight gain (thinks related to fluid) + leg swelling  Past Medical History:  Diagnosis Date   CKD (chronic kidney disease) stage 3, GFR 30-59 ml/min (HCC)    baseline Cr 1.5-1.7   Decreased visual acuity    Left eye, resolved - hypertensive retinopathy   Diabetes mellitus 2012   Type II   Hilar adenopathy    on CT scan 02/2010, on rpt scan stable/improved.   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   Systolic murmur    Vitamin D deficiency    Past Surgical History:  Procedure Laterality Date   AMPUTATION Right 09/17/2021   Procedure: First ray amputation;  Surgeon: Nadara Mustard, MD;  Location: [facility] OR;  Service: Orthopedics;  Laterality: Right;   APPLICATION OF WOUND VAC Right 10/10/2021   Procedure: APPLICATION OF WOUND VAC;  Surgeon: Nadara Mustard, MD;  Location: [facility] OR;  Service: Orthopedics;  Laterality: Right;   CARDIOVERSION N/A 01/31/2021    Procedure: CARDIOVERSION;  Surgeon: Dolores Patty, MD;  Location: [facility] ENDOSCOPY;  Service: Cardiovascular;  Laterality: N/A;   CARDIOVERSION N/A 07/04/2021   Procedure: CARDIOVERSION;  Surgeon: Dolores Patty, MD;  Location: [facility] ENDOSCOPY;  Service: Cardiovascular;  Laterality: N/A;   hospitalization  11/2008   malignant HTN, nl SPEP/UPEP,  neg ANCA panel, nl C3/4, neg anti GBM Ab, neg Hep A/B/C, nl renal US, nl PTH, neg HIV   I & D EXTREMITY Right 10/10/2021   Procedure: RIGHT FOOT DEBRIDEMENT;  Surgeon: Nadara Mustard, MD;  Location: [facility] OR;  Service: Orthopedics;  Laterality: Right;   RIGHT/LEFT HEART CATH AND CORONARY ANGIOGRAPHY N/A 01/29/2021   Procedure: RIGHT/LEFT HEART CATH AND CORONARY ANGIOGRAPHY;  Surgeon: Dolores Patty, MD;  Location: [facility] INVASIVE CV LAB;  Service: Cardiovascular;  Laterality: N/A;   TEE WITHOUT CARDIOVERSION N/A 01/31/2021   Procedure: TRANSESOPHAGEAL ECHOCARDIOGRAM (TEE);  Surgeon: Dolores Patty, MD;  Location: [facility] ENDOSCOPY;  Service: Cardiovascular;  Laterality: N/A;   US ECHOCARDIOGRAPHY  11/2008   LVsys fxn EF 50%, mild MR, normal LV size, neg ANA, neg cryoglobulins   US ECHOCARDIOGRAPHY  2011   severe LVH, EF 40%, LA mildly dilated, PA pressure moderately increased   Social History   Socioeconomic History   Marital status: Married    Spouse name: Haidar Muse   Number of children: 3   Years of education: Not on file   Highest education level: Bachelor's degree (e.g., BA, AB, BS)  Occupational History   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    Comment: [place]  Tobacco Use   Smoking status: Former    Types: Cigars   Smokeless tobacco: Never   Tobacco comments:    monthly, when going out  Vaping Use   Vaping Use: Never used  Substance and Sexual Activity   Alcohol use: Yes    Comment: Occasional   Drug use: No   Sexual activity: Not on file  Other Topics Concern   Not on file  Social History Narrative   Newly married, 2012, 1  daughter, 1 son   No injectable steroid cycles   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, 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)   Regular exercise-yes   Social Determinants of Health   Financial Resource Strain: Low Risk  (01/27/2021)   Overall Financial Resource Strain (CARDIA)    Difficulty of Paying Living Expenses: Not hard at all  Food Insecurity: No Food Insecurity (02/11/2021)   Hunger Vital Sign    Worried About Running Out of Food in the Last Year: Never true    Ran Out of Food in the Last Year: Never true  Transportation Needs: No Transportation Needs (01/27/2021)   PRAPARE - Administrator, Civil Service (Medical): No    Lack of Transportation (Non-Medical): No  Physical Activity: Not on file  Stress: Not on file  Social Connections: Not on file  Intimate Partner Violence: Not on file   Current Outpatient Medications on File Prior to Visit  Medication Sig Dispense Refill   amLODipine (NORVASC) 5 MG tablet Take 1 tablet (5 mg total) by mouth daily. 90 tablet 3   apixaban (ELIQUIS) 5 MG TABS tablet Take 1 tablet (5 mg total) by mouth 2 (two) times daily. 180 tablet 3   atorvastatin (LIPITOR) 20 MG tablet Take 1 tablet (20 mg total) by mouth at bedtime. 90 tablet 3   Continuous Blood Gluc Sensor (FREESTYLE LIBRE 3 SENSOR) MISC 1 each by Does not apply route every 14 (fourteen) days. 6 each 3   ENTRESTO 97-103 MG Take 1 tablet by mouth twice daily 60 tablet 11   furosemide (LASIX) 20 MG tablet Take 2 tablets (40 mg total) by mouth daily. 90 tablet 3   isosorbide-hydrALAZINE (BIDIL) 20-37.5 MG tablet Take 2 tablets by mouth  3 (three) times daily. 180 tablet 3   JARDIANCE 10 MG TABS tablet Take 1 tablet by mouth once daily 90 tablet 3   methimazole (TAPAZOLE) 5 MG tablet Take 1 tablet (5 mg total) by mouth daily. 45 tablet 1   metoprolol succinate (TOPROL-XL) 100 MG 24 hr tablet TAKE 1 TABLET BY MOUTH ONCE DAILY (TAKE  WITH  OR  IMMEDIATELY  FOLLOWING  A  MEAL) 90  tablet 1   pantoprazole (PROTONIX) 40 MG tablet Take 1 tablet (40 mg total) by mouth daily. 90 tablet 3   predniSONE (DELTASONE) 10 MG tablet Take 1 tablet (10 mg total) by mouth daily with breakfast. 45 tablet 1   tirzepatide (MOUNJARO) 12.5 MG/0.5ML Pen Inject 12.5 mg into the skin once a week. 2 mL 0   tirzepatide (MOUNJARO) 15 MG/0.5ML Pen Inject 15 mg into the skin once a week. 2 mL 5   No current facility-administered medications on file prior to visit.   Allergies  Allergen Reactions   Fish-Derived Products Anaphylaxis   Family History  Problem Relation Age of Onset   Hypertension Mother    Hypertension Father    Diabetes Father    Diabetes Sister    Hypertension Brother    Kidney disease Paternal Grandmother        ESRD   Coronary artery disease Neg Hx    Stroke Neg Hx    Cancer Neg Hx    PE: BP (!) 140/90 (BP Location: Right Arm, Patient Position: Sitting, Cuff Size: Normal)   Pulse 80   Ht 6\' 8"  (2.032 m)   Wt (!) 386 lb 9.6 oz (175.4 kg)   SpO2 99%   BMI 42.47 kg/m  Wt Readings from Last 3 Encounters:  08/12/22 (!) 386 lb 9.6 oz (175.4 kg)  06/18/22 (!) 375 lb (170.1 kg)  05/21/22 (!) 365 lb 9.6 oz (165.8 kg)   Constitutional: overweight, in NAD Eyes:  no exophthalmos, no lid lag, no stare ENT: no neck masses, no cervical lymphadenopathy Cardiovascular: RRR, No MRG, + significant bilateral leg swelling Respiratory: CTA B Musculoskeletal: no deformities Skin:no rashes Neurological: no tremor with outstretched hands  ASSESSMENT: 1. DM2, non-insulin-dependent, uncontrolled, with complications - CHF - A-fib with RVR - h/o CVA, CKD - osteomyelitis of the right foot, history of hallux amputation  2.  Amiodarone-induced thyrotoxicosis  PLAN:  1. Patient with longstanding, type 2 diabetes, on oral antidiabetic regimen with SGLT2 inhibitor and GLP-1/GIP receptor agonist, with previously poor control, which improved drastically at last visit, after starting  Mounjaro.  HbA1c at that time was 5.9%, decreased from 10.1%.  Sugars were mostly between 40 and 100.  He had hypoglycemia awareness only when getting to the 40s.  We discussed that this is too low, we had to relax his blood sugar control enough to allow him to feel blood sugars in the 70s or slightly lower.  We decreased his Mounjaro dose at that time from the highest dose, 15 mg to 12.5 mg weekly.  I advised him to let me know if he had any more low blood sugars, in which case, we would have needed to decrease the Mounjaro dose even more.  At last visit we also discussed about improving diet.  He was eating fast food in the morning on his way to work and was eating lunch out.  We discussed about reducing the amount of fat and salt in his diet and IBS about quick meals.  I also suggested a CGM.  She obtains this, but knocked it off -will attach a new one. -At today's visit, sugars are at goal with the exception of the period of time when he is doing the fast, when sugars are in the 60s in the morning.  However, he will finish the fast next week. -He tells me that he was off [facility] for 2 months as he could not find it anywhere.  I advised him to let me know if this happens, as we may use a GLP-1 receptor agonist instead.  We also discussed about possibly using a lower dose, but he would not want to decrease the dose, if possible.  He was finally able to restart the 12.5 mg dose last week. -For now, we can continue the same medication regimen - I suggested to:  Patient Instructions  Please continue: - Jardiance 10 mg daily in am - Mounjaro 12.5 mg weekly  Continue: - Methimazole 5 mg 1x a day - Prednisone 10 mg daily  Please stop at the lab.  Please return in 3-4 months.  - we checked his HbA1c: 6.4% (higher) - advised to check sugars at different times of the day - 4x a day, rotating check times - advised for yearly eye exams >> he is UTD - return to clinic in 3-4 months  2.  Amiodarone  induced thyrotoxicosis (AIT) -Patient with history of undetectable TSH and high free T4, in the setting of amiodarone treatment.  He did not have weight loss, heat intolerance, hyperdefecation, palpitations, anxiety.  He does have a 21 pound weight gain, which could be related to fluids. -Amiodarone was stopped in 11/2021 due to thyrotoxicosis.  At the same time, he was started on methimazole 5 mg twice a day and prednisone 20 mg daily.  He was also on Toprol-XL 100 mg daily. -He did not have other culprits for thyrotoxicosis, so far working diagnosis is AIT.  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  At last visit, since TFTs were improved, we decreased the dose of both methimazole and prednisone and asked him back for labs in 1 month to further adjust the treatment.  However, he did not return for these. -At today's visit, he does not describe thyrotoxic signs or symptoms -He is on methimazole 5 mg daily and prednisone 10 mg daily, tolerated well -Will recheck his TFTs and change the above medications as indicated -We did discuss about the need to come back for labs in approximately 1 month -I plan to see him back for another visit in 3 to 4 months  Component     Latest Ref Rng 08/12/2022  Hemoglobin A1C     4.0 - 5.6 % 6.4 !   TSH     0.35 - 5.50 uIU/mL 0.88   T4,Free(Direct)     0.60 - 1.60 ng/dL 1.76   Triiodothyronine,Free,Serum     2.3 - 4.2 pg/mL 3.1   Thyroid tests are normal.  Will go ahead and stop methimazole and decrease prednisone to 5 mg daily - will recheck the thyroid tests in 1 month.  Carlus Pavlov, MD PhD [facility] Endocrinology

## 2022-08-12 NOTE — Patient Instructions (Addendum)
Please continue: - Jardiance 10 mg daily in am - Mounjaro 12.5 mg weekly  Continue: - Methimazole 5 mg 1x a day - Prednisone 10 mg daily  Please stop at the lab - TSH, freet3, and freet4.   Please return in 4 months.

## 2022-08-30 ENCOUNTER — Other Ambulatory Visit: Payer: Self-pay | Admitting: Internal Medicine

## 2022-08-30 DIAGNOSIS — E118 Type 2 diabetes mellitus with unspecified complications: Secondary | ICD-10-CM

## 2022-09-01 ENCOUNTER — Other Ambulatory Visit: Payer: Self-pay | Admitting: Internal Medicine

## 2022-09-01 DIAGNOSIS — E118 Type 2 diabetes mellitus with unspecified complications: Secondary | ICD-10-CM

## 2022-09-02 ENCOUNTER — Encounter: Payer: Self-pay | Admitting: Cardiology

## 2022-09-11 ENCOUNTER — Encounter: Payer: Self-pay | Admitting: Family

## 2022-09-11 ENCOUNTER — Ambulatory Visit: Payer: 59 | Admitting: Family

## 2022-09-11 DIAGNOSIS — S98111A Complete traumatic amputation of right great toe, initial encounter: Secondary | ICD-10-CM

## 2022-09-11 DIAGNOSIS — L97511 Non-pressure chronic ulcer of other part of right foot limited to breakdown of skin: Secondary | ICD-10-CM | POA: Diagnosis not present

## 2022-09-11 DIAGNOSIS — Z89411 Acquired absence of right great toe: Secondary | ICD-10-CM | POA: Diagnosis not present

## 2022-09-11 NOTE — Progress Notes (Signed)
Office Visit Note   Patient: Randy [name]           Date of Birth: 1970-09-01           MRN: [id number] Visit Date: 09/11/2022              Requested by: No referring provider defined for this encounter. PCP: Pcp, No  Chief Complaint  Patient presents with   Right Foot - Follow-up      HPI: The patient is a 52 year old gentleman who is seen today in follow-up.  He is status post right foot debridement with graft placement last June this has been slow to heal he has had some Kerecis powder applied in the office in the past was last seen in March.  He has been doing dry dressing changes  Assessment & Plan: Visit Diagnoses: No diagnosis found.  Plan: Given another double extra-large compression stocking with impregnated silver discussed using this with daily Dial soap cleansing sock with direct skin contact wear around-the-clock.  He will follow-up in the office as needed.  Follow-Up Instructions: No follow-ups on file.   Ortho Exam  Patient is alert, oriented, no adenopathy, well-dressed, normal affect, normal respiratory effort. On examination of the right foot the dorsal foot ulcer along the medial column is 25 mm in diameter there is no surrounding erythema there is 2 mm of epithelialization.  There is no active drainage no exudative tissue no sign of infection  Imaging: No results found.   Labs: Lab Results  Component Value Date   HGBA1C 6.4 (A) 08/12/2022   HGBA1C 5.9 (A) 05/06/2022   HGBA1C 10.1 (H) 09/14/2021   ESRSEDRATE 3 08/07/2020   REPTSTATUS 09/19/2021 FINAL 09/14/2021   CULT  09/14/2021    NO GROWTH 5 DAYS Performed at [facility], [address], [place], [place] [address]      Lab Results  Component Value Date   ALBUMIN 3.4 (L) 04/22/2022   ALBUMIN 3.2 (L) 03/25/2022   ALBUMIN 3.2 (L) 11/21/2021    Lab Results  Component Value Date   MG 2.2 09/17/2021   MG 1.8 01/30/2021   MG 1.8 01/26/2021   No results found for: "VD25OH"  No  results found for: "PREALBUMIN"    Latest Ref Rng & Units 03/25/2022    3:31 PM 11/21/2021    4:15 PM 09/19/2021    4:23 AM  CBC EXTENDED  WBC 4.0 - 10.5 K/uL 11.9  8.4  16.4   RBC 4.22 - 5.81 MIL/uL 5.29  4.46  4.31   Hemoglobin 13.0 - 17.0 g/dL 19.1  47.8  29.5   HCT 39.0 - 52.0 % 40.1  36.4  35.6   Platelets 150 - 400 K/uL 279  303  290      There is no height or weight on file to calculate BMI.  Orders:  No orders of the defined types were placed in this encounter.  No orders of the defined types were placed in this encounter.    Procedures: No procedures performed  Clinical Data: No additional findings.  ROS:  All other systems negative, except as noted in the HPI. Review of Systems  Objective: Vital Signs: There were no vitals taken for this visit.  Specialty Comments:  No specialty comments available.  PMFS History: Patient Active Problem List   Diagnosis Date Noted   Hypercoagulable state due to persistent atrial fibrillation (HCC) 05/21/2022   Osteomyelitis of great toe of right foot (HCC)  Cutaneous abscess of right foot    Cellulitis of right foot 09/15/2021   Leg pain, medial, right 09/14/2021   Permanent atrial fibrillation (HCC) 01/27/2021   SIRS (systemic inflammatory response syndrome) (HCC) 01/27/2021   HTN (hypertension) 01/27/2021   Hypokalemia 01/27/2021   Acute systolic CHF (congestive heart failure) (HCC) 01/27/2021   New onset of congestive heart failure (HCC) 01/26/2021   Acute CVA (cerebrovascular accident) (HCC) 08/07/2020   Acute upper respiratory infection 11/27/2014   Numbness of finger 09/19/2011   Hilar adenopathy    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HCC) 09/05/2010   Type 2 diabetes mellitus with complication, without long-term current use of insulin (HCC) 04/23/2010   Renal insufficiency 03/21/2008   VISUAL ACUITY, DECREASED, LEFT EYE 03/19/2008   BELL'S PALSY, LEFT 01/30/2008   Morbid obesity (HCC) 01/20/2008   Malignant hypertension with  heart failure and stage 3 chronic kidney disease (HCC) 01/20/2008   Past Medical History:  Diagnosis Date   CKD (chronic kidney disease) stage 3, GFR 30-59 ml/min (HCC)    baseline Cr 1.5-1.7   Decreased visual acuity    Left eye, resolved - hypertensive retinopathy   Diabetes mellitus 2012   Type II   Hilar adenopathy    on CT scan 02/2010, on rpt scan stable/improved.   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   Systolic murmur    Vitamin D deficiency     Family History  Problem Relation Age of Onset   Hypertension Mother    Hypertension Father    Diabetes Father    Diabetes Sister    Hypertension Brother    Kidney disease Paternal Grandmother        ESRD   Coronary artery disease Neg Hx    Stroke Neg Hx    Cancer Neg Hx     Past Surgical History:  Procedure Laterality Date   AMPUTATION Right 09/17/2021   Procedure: First ray amputation;  Surgeon: Nadara Mustard, MD;  Location: [facility] OR;  Service: Orthopedics;  Laterality: Right;   APPLICATION OF WOUND VAC Right 10/10/2021   Procedure: APPLICATION OF WOUND VAC;  Surgeon: Nadara Mustard, MD;  Location: [facility] OR;  Service: Orthopedics;  Laterality: Right;   CARDIOVERSION N/A 01/31/2021   Procedure: CARDIOVERSION;  Surgeon: Dolores Patty, MD;  Location: [facility] ENDOSCOPY;  Service: Cardiovascular;  Laterality: N/A;   CARDIOVERSION N/A 07/04/2021   Procedure: CARDIOVERSION;  Surgeon: Dolores Patty, MD;  Location: [facility] ENDOSCOPY;  Service: Cardiovascular;  Laterality: N/A;   hospitalization  11/2008   malignant HTN, nl SPEP/UPEP, neg ANCA panel, nl C3/4, neg anti GBM Ab, neg Hep A/B/C, nl renal US, nl PTH, neg HIV   I & D EXTREMITY Right  10/10/2021   Procedure: RIGHT FOOT DEBRIDEMENT;  Surgeon: Nadara Mustard, MD;  Location: [facility] OR;  Service: Orthopedics;  Laterality: Right;   RIGHT/LEFT HEART CATH AND CORONARY ANGIOGRAPHY N/A 01/29/2021   Procedure: RIGHT/LEFT HEART CATH AND CORONARY ANGIOGRAPHY;  Surgeon: Dolores Patty, MD;  Location: [facility] INVASIVE CV LAB;  Service: Cardiovascular;  Laterality: N/A;   TEE WITHOUT CARDIOVERSION N/A 01/31/2021   Procedure: TRANSESOPHAGEAL ECHOCARDIOGRAM (TEE);  Surgeon: Dolores Patty, MD;  Location: [facility] ENDOSCOPY;  Service: Cardiovascular;  Laterality: N/A;   US ECHOCARDIOGRAPHY  11/2008   LVsys fxn EF 50%, mild MR, normal LV size, neg ANA, neg cryoglobulins   US ECHOCARDIOGRAPHY  2011   severe LVH, EF 40%, LA mildly dilated, PA pressure moderately increased   Social History   Occupational History   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    Comment: [place]  Tobacco Use   Smoking status: Former    Types: Cigars   Smokeless tobacco: Never   Tobacco comments:    monthly, when going out  Advertising account planner   Vaping Use: Never used  Substance and Sexual Activity   Alcohol use: Yes    Comment: Occasional   Drug use: No   Sexual activity: Not on file

## 2022-09-17 ENCOUNTER — Telehealth: Payer: Self-pay | Admitting: Cardiology

## 2022-09-17 NOTE — Telephone Encounter (Signed)
Patient is requesting call back to discuss moving ablations procedure to a Friday.

## 2022-09-17 NOTE — Telephone Encounter (Signed)
I called pt and was able to switch a pt that was scheduled on Friday 6/7 with him.  All have been updated.

## 2022-09-18 ENCOUNTER — Ambulatory Visit: Payer: 59 | Attending: Cardiology

## 2022-09-18 DIAGNOSIS — I4819 Other persistent atrial fibrillation: Secondary | ICD-10-CM

## 2022-09-19 LAB — BASIC METABOLIC PANEL
BUN/Creatinine Ratio: 11 (ref 9–20)
BUN: 24 mg/dL (ref 6–24)
CO2: 23 mmol/L (ref 20–29)
Calcium: 9.2 mg/dL (ref 8.7–10.2)
Chloride: 103 mmol/L (ref 96–106)
Creatinine, Ser: 2.16 mg/dL — ABNORMAL HIGH (ref 0.76–1.27)
Glucose: 92 mg/dL (ref 70–99)
Potassium: 4.4 mmol/L (ref 3.5–5.2)
Sodium: 141 mmol/L (ref 134–144)
eGFR: 37 mL/min/{1.73_m2} — ABNORMAL LOW (ref >59)

## 2022-09-19 LAB — CBC
Hematocrit: 49.7 % (ref 37.5–51.0)
Hemoglobin: 15.3 g/dL (ref 13.0–17.7)
MCH: 24.5 pg — ABNORMAL LOW (ref 26.6–33.0)
MCHC: 30.8 g/dL — ABNORMAL LOW (ref 31.5–35.7)
MCV: 80 fL (ref 79–97)
Platelets: 269 10*3/uL (ref 150–450)
RBC: 6.24 x10E6/uL — ABNORMAL HIGH (ref 4.14–5.80)
RDW: 16.2 % — ABNORMAL HIGH (ref 11.6–15.4)
WBC: 8.3 10*3/uL (ref 3.4–10.8)

## 2022-09-21 NOTE — Pre-Procedure Instructions (Signed)
Patient takes [place].  Patient scheduled for anesthesia procedure on Firday June 7,  Greggory Keen- last dose on or before 5/30  London Pepper- last dose on Monday 6/3

## 2022-09-23 ENCOUNTER — Ambulatory Visit (HOSPITAL_COMMUNITY): Payer: 59

## 2022-09-30 ENCOUNTER — Telehealth: Payer: Self-pay | Admitting: Cardiology

## 2022-09-30 NOTE — Telephone Encounter (Signed)
  Randy Jorja Loa, MD 09/29/2022  5:13 PM EDT     Stable preop labs Forward to PCP to work up creatinine    The patient has been notified of the result and verbalized understanding.  All questions (if any) were answered. [name] Socks, LPN 05/02/1094 0:45 PM

## 2022-09-30 NOTE — Telephone Encounter (Signed)
Pt states that he is returning a call. Please advise

## 2022-10-02 ENCOUNTER — Ambulatory Visit (HOSPITAL_COMMUNITY)
Admission: RE | Admit: 2022-10-02 | Discharge: 2022-10-02 | Disposition: A | Discharge status: Home / Self Care | Payer: 59 | Attending: Cardiology | Admitting: Cardiology

## 2022-10-02 ENCOUNTER — Ambulatory Visit (HOSPITAL_COMMUNITY): Payer: 59 | Admitting: Certified Registered Nurse Anesthetist

## 2022-10-02 ENCOUNTER — Ambulatory Visit (HOSPITAL_BASED_OUTPATIENT_CLINIC_OR_DEPARTMENT_OTHER): Payer: 59

## 2022-10-02 ENCOUNTER — Ambulatory Visit (HOSPITAL_BASED_OUTPATIENT_CLINIC_OR_DEPARTMENT_OTHER): Payer: 59 | Admitting: Certified Registered Nurse Anesthetist

## 2022-10-02 ENCOUNTER — Encounter (HOSPITAL_COMMUNITY)
Admission: RE | Disposition: A | Discharge status: Home / Self Care | Payer: Self-pay | Source: Home / Self Care | Attending: Cardiology

## 2022-10-02 ENCOUNTER — Other Ambulatory Visit: Payer: Self-pay

## 2022-10-02 DIAGNOSIS — E785 Hyperlipidemia, unspecified: Secondary | ICD-10-CM | POA: Insufficient documentation

## 2022-10-02 DIAGNOSIS — Z87891 Personal history of nicotine dependence: Secondary | ICD-10-CM | POA: Insufficient documentation

## 2022-10-02 DIAGNOSIS — I2510 Atherosclerotic heart disease of native coronary artery without angina pectoris: Secondary | ICD-10-CM | POA: Diagnosis not present

## 2022-10-02 DIAGNOSIS — E1122 Type 2 diabetes mellitus with diabetic chronic kidney disease: Secondary | ICD-10-CM | POA: Diagnosis not present

## 2022-10-02 DIAGNOSIS — I110 Hypertensive heart disease with heart failure: Secondary | ICD-10-CM

## 2022-10-02 DIAGNOSIS — Z79899 Other long term (current) drug therapy: Secondary | ICD-10-CM | POA: Insufficient documentation

## 2022-10-02 DIAGNOSIS — I130 Hypertensive heart and chronic kidney disease with heart failure and stage 1 through stage 4 chronic kidney disease, or unspecified chronic kidney disease: Secondary | ICD-10-CM | POA: Insufficient documentation

## 2022-10-02 DIAGNOSIS — I4819 Other persistent atrial fibrillation: Secondary | ICD-10-CM | POA: Insufficient documentation

## 2022-10-02 DIAGNOSIS — I509 Heart failure, unspecified: Secondary | ICD-10-CM

## 2022-10-02 DIAGNOSIS — I5022 Chronic systolic (congestive) heart failure: Secondary | ICD-10-CM | POA: Diagnosis not present

## 2022-10-02 DIAGNOSIS — I4891 Unspecified atrial fibrillation: Secondary | ICD-10-CM | POA: Diagnosis not present

## 2022-10-02 DIAGNOSIS — I4821 Permanent atrial fibrillation: Secondary | ICD-10-CM

## 2022-10-02 DIAGNOSIS — N183 Chronic kidney disease, stage 3 unspecified: Secondary | ICD-10-CM | POA: Insufficient documentation

## 2022-10-02 DIAGNOSIS — Z7901 Long term (current) use of anticoagulants: Secondary | ICD-10-CM | POA: Diagnosis not present

## 2022-10-02 DIAGNOSIS — Z8673 Personal history of transient ischemic attack (TIA), and cerebral infarction without residual deficits: Secondary | ICD-10-CM | POA: Insufficient documentation

## 2022-10-02 DIAGNOSIS — G4733 Obstructive sleep apnea (adult) (pediatric): Secondary | ICD-10-CM | POA: Diagnosis not present

## 2022-10-02 HISTORY — PX: TEE WITHOUT CARDIOVERSION: SHX5443

## 2022-10-02 HISTORY — PX: ATRIAL FIBRILLATION ABLATION: EP1191

## 2022-10-02 LAB — GLUCOSE, CAPILLARY
Glucose-Capillary: 93 mg/dL (ref 70–99)
Glucose-Capillary: 127 mg/dL — ABNORMAL HIGH (ref 70–99)

## 2022-10-02 LAB — ECHO TEE: Weight: 5920 oz

## 2022-10-02 SURGERY — ATRIAL FIBRILLATION ABLATION
Anesthesia: General

## 2022-10-02 MED ORDER — SUCCINYLCHOLINE CHLORIDE 200 MG/10ML IV SOSY
PREFILLED_SYRINGE | INTRAVENOUS | Status: DC | PRN
  Administered 2022-10-02: 200 mg via INTRAVENOUS

## 2022-10-02 MED ORDER — HEPARIN SODIUM (PORCINE) 1000 UNIT/ML IJ SOLN
INTRAMUSCULAR | Status: DC | PRN
  Administered 2022-10-02: 1000 [IU] via INTRAVENOUS

## 2022-10-02 MED ORDER — PROTAMINE SULFATE 10 MG/ML IV SOLN
INTRAVENOUS | Status: DC | PRN
  Administered 2022-10-02: 50 mg via INTRAVENOUS

## 2022-10-02 MED ORDER — HEPARIN SODIUM (PORCINE) 1000 UNIT/ML IJ SOLN
INTRAMUSCULAR | Status: DC | PRN
  Administered 2022-10-02: 16000 [IU] via INTRAVENOUS
  Administered 2022-10-02: 5000 [IU] via INTRAVENOUS

## 2022-10-02 MED ORDER — PROPOFOL 10 MG/ML IV BOLUS
INTRAVENOUS | Status: DC | PRN
  Administered 2022-10-02: 100 mg via INTRAVENOUS
  Administered 2022-10-02: 300 mg via INTRAVENOUS

## 2022-10-02 MED ORDER — SODIUM CHLORIDE 0.9 % IV SOLN: INTRAVENOUS | Status: DC

## 2022-10-02 MED ORDER — HYDRALAZINE HCL 20 MG/ML IJ SOLN
INTRAMUSCULAR | Status: DC | PRN
  Administered 2022-10-02: 5 mg via INTRAVENOUS

## 2022-10-02 MED ORDER — SODIUM CHLORIDE 0.9% FLUSH: 3.0000 mL | Freq: Two times a day (BID) | INTRAVENOUS | Status: DC

## 2022-10-02 MED ORDER — PHENYLEPHRINE HCL-NACL 20-0.9 MG/250ML-% IV SOLN
INTRAVENOUS | Status: DC | PRN
  Administered 2022-10-02: 25 ug/min via INTRAVENOUS

## 2022-10-02 MED ORDER — SODIUM CHLORIDE 0.9% FLUSH: 3.0000 mL | INTRAVENOUS | Status: DC | PRN

## 2022-10-02 MED ORDER — AMLODIPINE BESYLATE 5 MG PO TABS
5.0000 mg | ORAL_TABLET | Freq: Once | ORAL | Status: AC
  Administered 2022-10-02: 5 mg via ORAL
  Filled 2022-10-02: qty 1

## 2022-10-02 MED ORDER — ROCURONIUM BROMIDE 10 MG/ML (PF) SYRINGE
PREFILLED_SYRINGE | INTRAVENOUS | Status: DC | PRN
  Administered 2022-10-02 (×2): 40 mg via INTRAVENOUS

## 2022-10-02 MED ORDER — PHENYLEPHRINE 80 MCG/ML (10ML) SYRINGE FOR IV PUSH (FOR BLOOD PRESSURE SUPPORT)
PREFILLED_SYRINGE | INTRAVENOUS | Status: DC | PRN
  Administered 2022-10-02 (×3): 80 ug via INTRAVENOUS
  Administered 2022-10-02: 160 ug via INTRAVENOUS
  Administered 2022-10-02: 80 ug via INTRAVENOUS

## 2022-10-02 MED ORDER — DEXAMETHASONE SODIUM PHOSPHATE 10 MG/ML IJ SOLN
INTRAMUSCULAR | Status: DC | PRN
  Administered 2022-10-02: 10 mg via INTRAVENOUS

## 2022-10-02 MED ORDER — ONDANSETRON HCL 4 MG/2ML IJ SOLN: 4.0000 mg | Freq: Four times a day (QID) | INTRAMUSCULAR | Status: DC | PRN

## 2022-10-02 MED ORDER — MIDAZOLAM HCL 2 MG/2ML IJ SOLN
INTRAMUSCULAR | Status: DC | PRN
  Administered 2022-10-02: 2 mg via INTRAVENOUS

## 2022-10-02 MED ORDER — LABETALOL HCL 5 MG/ML IV SOLN
INTRAVENOUS | Status: DC | PRN
  Administered 2022-10-02 (×2): 10 mg via INTRAVENOUS

## 2022-10-02 MED ORDER — SODIUM CHLORIDE 0.9 % IV SOLN: 250.0000 mL | INTRAVENOUS | Status: DC | PRN

## 2022-10-02 MED ORDER — HEPARIN SODIUM (PORCINE) 1000 UNIT/ML IJ SOLN
INTRAMUSCULAR | Status: AC
  Filled 2022-10-02: qty 10

## 2022-10-02 MED ORDER — METOPROLOL SUCCINATE ER 100 MG PO TB24
100.0000 mg | ORAL_TABLET | Freq: Once | ORAL | Status: AC
  Administered 2022-10-02: 100 mg via ORAL
  Filled 2022-10-02: qty 1

## 2022-10-02 MED ORDER — HEPARIN (PORCINE) IN NACL 1000-0.9 UT/500ML-% IV SOLN
INTRAVENOUS | Status: DC | PRN
  Administered 2022-10-02 (×4): 500 mL

## 2022-10-02 MED ORDER — ONDANSETRON HCL 4 MG/2ML IJ SOLN
INTRAMUSCULAR | Status: DC | PRN
  Administered 2022-10-02: 4 mg via INTRAVENOUS

## 2022-10-02 MED ORDER — LIDOCAINE 2% (20 MG/ML) 5 ML SYRINGE
INTRAMUSCULAR | Status: DC | PRN
  Administered 2022-10-02: 20 mg via INTRAVENOUS

## 2022-10-02 MED ORDER — ACETAMINOPHEN 500 MG PO TABS
ORAL_TABLET | ORAL | Status: AC
  Administered 2022-10-02: 1000 mg via ORAL
  Filled 2022-10-02: qty 2

## 2022-10-02 MED ORDER — SUGAMMADEX SODIUM 200 MG/2ML IV SOLN
INTRAVENOUS | Status: DC | PRN
  Administered 2022-10-02: 400 mg via INTRAVENOUS

## 2022-10-02 MED ORDER — ACETAMINOPHEN 325 MG PO TABS: 650.0000 mg | ORAL_TABLET | ORAL | Status: DC | PRN

## 2022-10-02 MED ORDER — FENTANYL CITRATE (PF) 250 MCG/5ML IJ SOLN
INTRAMUSCULAR | Status: DC | PRN
  Administered 2022-10-02: 100 ug via INTRAVENOUS
  Administered 2022-10-02 (×2): 50 ug via INTRAVENOUS

## 2022-10-02 MED ORDER — ACETAMINOPHEN 500 MG PO TABS: 1000.0000 mg | ORAL_TABLET | Freq: Once | ORAL | Status: AC

## 2022-10-02 SURGICAL SUPPLY — 20 items
BAG SNAP BAND KOVER 36X36 (MISCELLANEOUS) IMPLANT
BLANKET WARM UNDERBOD FULL ACC (MISCELLANEOUS) ×1 IMPLANT
CATH ABLAT QDOT MICRO BI TC DF (CATHETERS) IMPLANT
CATH OCTARAY 2.0 F 3-3-3-3-3 (CATHETERS) IMPLANT
CATH PIGTAIL STEERABLE D1 8.7 (WIRE) IMPLANT
CATH S-M CIRCA TEMP PROBE (CATHETERS) IMPLANT
CATH WEB BI DIR CSDF CRV REPRO (CATHETERS) IMPLANT
CLOSURE MYNX CONTROL 6F/7F (Vascular Products) IMPLANT
CLOSURE PERCLOSE PROSTYLE (VASCULAR PRODUCTS) IMPLANT
COVER SWIFTLINK CONNECTOR (BAG) ×1 IMPLANT
PACK EP LATEX FREE (CUSTOM PROCEDURE TRAY) ×1
PACK EP LF (CUSTOM PROCEDURE TRAY) ×1 IMPLANT
PAD DEFIB RADIO PHYSIO CONN (PAD) ×1 IMPLANT
PATCH CARTO3 (PAD) IMPLANT
SHEATH CARTO VIZIGO SM CVD (SHEATH) IMPLANT
SHEATH PINNACLE 7F 10CM (SHEATH) IMPLANT
SHEATH PINNACLE 8F 10CM (SHEATH) IMPLANT
SHEATH PINNACLE 9F 10CM (SHEATH) IMPLANT
SHEATH PROBE COVER 6X72 (BAG) IMPLANT
TUBING SMART ABLATE COOLFLOW (TUBING) IMPLANT

## 2022-10-02 NOTE — Anesthesia Procedure Notes (Signed)
Procedure Name: Intubation Date/Time: 10/02/2022 10:06 AM  Performed by: Garfield Cornea, CRNAPre-anesthesia Checklist: Patient identified, Emergency Drugs available, Suction available and Patient being monitored Patient Re-evaluated:Patient Re-evaluated prior to induction Oxygen Delivery Method: Circle System Utilized Preoxygenation: Pre-oxygenation with 100% oxygen Induction Type: IV induction Ventilation: Mask ventilation without difficulty, Oral airway inserted - appropriate to patient size and Two handed mask ventilation required Laryngoscope Size: Glidescope and 4 Grade View: Grade I Tube type: Oral Tube size: 7.5 mm Number of attempts: 1 Airway Equipment and Method: Rigid stylet, Oral airway and Video-laryngoscopy Placement Confirmation: ETT inserted through vocal cords under direct vision, positive ETCO2 and breath sounds checked- equal and bilateral Secured at: 24 cm Tube secured with: Tape Dental Injury: Teeth and Oropharynx as per pre-operative assessment

## 2022-10-02 NOTE — Anesthesia Preprocedure Evaluation (Addendum)
Anesthesia Evaluation  Patient identified by MRN, date of birth, ID band Patient awake    Reviewed: Allergy & Precautions, NPO status , Patient's Chart, lab work & pertinent test results, reviewed documented beta blocker date and time   History of Anesthesia Complications Negative for: history of anesthetic complications  Airway Mallampati: III  TM Distance: >3 FB Neck ROM: Full    Dental  (+) Dental Advisory Given   Pulmonary former smoker   breath sounds clear to auscultation       Cardiovascular hypertension, Pt. on medications and Pt. on home beta blockers +CHF Sherryll Burger)   Rhythm:Irregular Rate:Tachycardia  '23 ECHO:  1. Left ventricular EF 55-60%. by PLAX is 56 %. Normal LVF, no regional wall motion  abnormalities. There is severe LVH. Left ventricular diastolic parameters are  indeterminate. Elevated left ventricular end-diastolic pressure.   2. RVF is normal. The right ventricular size is normal.   3. Left atrial size was moderately dilated.   4. The mitral valve is normal in structure. No evidence of MR. No evidence of mitral stenosis.   5. The aortic valve is tricuspid. Aortic valve regurgitation is not visualized. No aortic stenosis is present.   6. Compared with the echo 06/2021, aortic root has increased in size from 3.9 cm to 4.2 cm. The ascending aorta has increased from 3.7 cm to 4.4 cm. Aortic dilatation noted. There is mild dilatation of the aortic root, measuring 42 mm. There is moderate dilatation of the ascending aorta, measuring 44 mm.     Neuro/Psych CVA    GI/Hepatic Neg liver ROS,GERD  Medicated and Controlled,,  Endo/Other  diabetes (glu 93), Oral Hypoglycemic Agents  BMI 40.6  Renal/GU Renal InsufficiencyRenal disease     Musculoskeletal   Abdominal  (+) + obese  Peds  Hematology negative hematology ROS (+)   Anesthesia Other Findings   Reproductive/Obstetrics                               Anesthesia Physical Anesthesia Plan  ASA: 4  Anesthesia Plan: General   Post-op Pain Management: Tylenol PO (pre-op)*   Induction: Intravenous  PONV Risk Score and Plan: 2 and Ondansetron and Dexamethasone  Airway Management Planned: Oral ETT and Video Laryngoscope Planned  Additional Equipment: None  Intra-op Plan:   Post-operative Plan: Extubation in OR  Informed Consent: I have reviewed the patients History and Physical, chart, labs and discussed the procedure including the risks, benefits and alternatives for the proposed anesthesia with the patient or authorized representative who has indicated his/her understanding and acceptance.     Dental advisory given  Plan Discussed with: CRNA and Surgeon  Anesthesia Plan Comments:          Anesthesia Quick Evaluation

## 2022-10-02 NOTE — Transfer of Care (Signed)
Immediate Anesthesia Transfer of Care Note  Patient: Randy [name]  Procedure(s) Performed: ATRIAL FIBRILLATION ABLATION TRANSESOPHAGEAL ECHOCARDIOGRAM  Patient Location: Cath Lab  Anesthesia Type:General  Level of Consciousness: drowsy  Airway & Oxygen Therapy: Patient Spontanous Breathing  Post-op Assessment: Report given to RN and Post -op Vital signs reviewed and stable  Post vital signs: Reviewed and stable  Last Vitals:  Vitals Value Taken Time  BP 133/94 10/02/22 1230  Temp    Pulse 83 10/02/22 1235  Resp 15 10/02/22 1235  SpO2 94 % 10/02/22 1235  Vitals shown include unvalidated device data.  Last Pain:  Vitals:   10/02/22 0835  TempSrc:   PainSc: 0-No pain         Complications: There were no known notable events for this encounter.

## 2022-10-02 NOTE — H&P (Signed)
Electrophysiology Office Note   Date:  10/02/2022   ID:  Randy [name], DOB 1970/12/20, MRN [id number]  PCP:  Pcp, No  Cardiologist:  Dr. Gala Romney Primary Electrophysiologist:  Samira Acero Jorja Loa, MD    Chief Complaint: Persistent AF   History of Present Illness: Randy [name] is a 52 y.o. male who is being seen today for the evaluation of persistent AF at the request of No ref. provider found. Presenting today for electrophysiology evaluation.  Randy [name] is a 52 y.o. male with a history of HFrEF, HTN, CKD, HLD, DM, CVA, OSA, CAD, atrial fibrillation who presents for consultation to discuss potential ablation for persistent AF.   Past medical history includes: Admission in 12/02/2019 with newly diagnosed A fib + ischemic CVA. He did not meet criteria for IV tPA. MRI showed large acute focal infarct, left frontal lobe predominantly ACA distribution. Echocardiogram obtained with bubble study showing no cardiac etiology for stroke. LVEF was 40 to 45%. Placed on Toprol XL and Eliquis for a CHDAS2VASC score of 6.    Followed at [facility] for cardiology services. Saw Cyndia Diver on 12/12/2019. Set up for sleep study.    Admitted 10/22 with A/C HFrEF and A fib RVR.  Echo showed EF down to 30-35% with speckled appears concerning for amyloid. Diuresed with IV lasix and placed on Amio drip. Once diuresed had TEE-DC-CV with restoration of NSR. Had RHC/LHC with min obs CAD, elevated filling pressures and normal cardiac output.  Diuresed 40 pounds. Repeat echo 3/23 showed EF 55% severe LVH.   His amiodarone was stopped 8/23 due to elevated thyroid tests. Started on methimazole 5 bid and prednisone. He saw Dr Gala Romney 04/22/22 and was found to be back in afib. He was seen in the AF clinic to discussion treatment options and sent today to discuss ablation. He is less interested in additional medication therapy and also with prolonged QT less desirable candidate for Tikosyn.   Today, patient  reports that he feels well despite being in afib. He is currently in AF. He admits to being noncompliant with CPAP. He last tried one month ago but admits to not using it consistently over the past several years. He knows he is currently in AF and remembers the difference after DCCV (also had significant diuresis). He felt great at that time and could work out / exercise without issue. When he is in AF he notices fatigue with exertion quicker than normal.   He is compliant with his Toprol and Eliquis.   Today, denies symptoms of palpitations, chest pain, shortness of breath, orthopnea, PND, lower extremity edema, claudication, dizziness, presyncope, syncope, bleeding, or neurologic sequela. The patient is tolerating medications without difficulties. Plan ablation today.    Past Medical History:  Diagnosis Date   CKD (chronic kidney disease) stage 3, GFR 30-59 ml/min (HCC)    baseline Cr 1.5-1.7   Decreased visual acuity    Left eye, resolved - hypertensive retinopathy   Diabetes mellitus 2012   Type II   Hilar adenopathy    on CT scan 02/2010, on rpt scan stable/improved.   History of Bell's palsy 12/2007   history, Left   History of headache    HTN (hypertension), malignant    previously on BC and goody powders for HA   Internal derangement of knee 12/2007   Left   Microalbuminuria    Morbid obesity (HCC)    Stroke (HCC)    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HCC)    echo 2011 with nonischemic  hypertensive cardiomyopathy   Systolic murmur    Vitamin D deficiency    Past Surgical History:  Procedure Laterality Date   AMPUTATION Right 09/17/2021   Procedure: First ray amputation;  Surgeon: Nadara Mustard, MD;  Location: [facility] OR;  Service: Orthopedics;  Laterality: Right;   APPLICATION OF WOUND VAC Right 10/10/2021   Procedure: APPLICATION OF WOUND VAC;  Surgeon: Nadara Mustard, MD;  Location: [facility] OR;  Service: Orthopedics;  Laterality: Right;   CARDIOVERSION N/A 01/31/2021   Procedure: CARDIOVERSION;   Surgeon: Dolores Patty, MD;  Location: [facility] ENDOSCOPY;  Service: Cardiovascular;  Laterality: N/A;   CARDIOVERSION N/A 07/04/2021   Procedure: CARDIOVERSION;  Surgeon: Dolores Patty, MD;  Location: [facility] ENDOSCOPY;  Service: Cardiovascular;  Laterality: N/A;   hospitalization  11/2008   malignant HTN, nl SPEP/UPEP, neg ANCA panel, nl C3/4, neg anti GBM Ab, neg Hep A/B/C, nl renal US, nl PTH, neg HIV   I & D EXTREMITY Right 10/10/2021   Procedure: RIGHT FOOT DEBRIDEMENT;  Surgeon: Nadara Mustard, MD;  Location: [facility] OR;  Service: Orthopedics;  Laterality: Right;   RIGHT/LEFT HEART CATH AND CORONARY ANGIOGRAPHY N/A 01/29/2021   Procedure: RIGHT/LEFT HEART CATH AND CORONARY ANGIOGRAPHY;  Surgeon: Dolores Patty, MD;  Location: [facility] INVASIVE CV LAB;  Service: Cardiovascular;  Laterality: N/A;   TEE WITHOUT CARDIOVERSION N/A 01/31/2021   Procedure: TRANSESOPHAGEAL ECHOCARDIOGRAM (TEE);  Surgeon: Dolores Patty, MD;  Location: [facility] ENDOSCOPY;  Service: Cardiovascular;  Laterality: N/A;   US ECHOCARDIOGRAPHY  11/2008   LVsys fxn EF 50%, mild MR, normal LV size, neg ANA, neg cryoglobulins   US ECHOCARDIOGRAPHY  2011   severe LVH, EF 40%, LA mildly dilated, PA pressure moderately increased     Current Facility-Administered Medications  Medication Dose Route Frequency Provider Last Rate Last Admin   0.9 %  sodium chloride infusion   Intravenous Continuous Regan Lemming, MD 50 mL/hr at 10/02/22 0853 New Bag at 10/02/22 0853    Allergies:   Fish-derived products   Social History:  The patient  reports that he has quit smoking. His smoking use included cigars. He has never used smokeless tobacco. He reports current alcohol use. He reports that he does not use drugs. He previously played in the [employer].  Family History:  The patient's family history includes Diabetes in his father and sister; Hypertension in his brother, father, and mother; Kidney disease in his paternal grandmother.   ROS:   Please see the history of present illness.   Otherwise, review of systems is positive for none.   All other systems are reviewed and negative.   PHYSICAL EXAM: VS:  BP (!) 153/103   Pulse 87   Temp 98.1 F (36.7 C) (Oral)   Resp 20   Ht 6\' 8"  (2.032 m)   Wt (!) 167.8 kg   SpO2 95%   BMI 40.65 kg/m  , BMI Body mass index is 40.65 kg/m. GEN: Well nourished, well developed, in no acute distress  HEENT: normal  Neck: no JVD, carotid bruits, or masses Cardiac: irregular; no murmurs, rubs, or gallops,no edema  Respiratory:  clear to auscultation bilaterally, normal work of breathing GI: soft, nontender, nondistended, + BS MS: no deformity or atrophy  Skin: warm and dry Neuro:  Strength and sensation are intact Psych: euthymic mood, full affect   Recent Labs: 04/22/2022: ALT 34; B Natriuretic Peptide 510.3 08/12/2022: TSH 0.88 09/18/2022: BUN 24; Creatinine, Ser 2.16; Hemoglobin 15.3; Platelets 269; Potassium 4.4;  Sodium 141    Lipid Panel     Component Value Date/Time   CHOL 136 05/06/2022 1230   TRIG 52.0 05/06/2022 1230   HDL 68.90 05/06/2022 1230   CHOLHDL 2 05/06/2022 1230   VLDL 10.4 05/06/2022 1230   LDLCALC 57 05/06/2022 1230   LDLDIRECT 79.0 11/28/2014 1705     Wt Readings from Last 3 Encounters:  10/02/22 (!) 167.8 kg  08/12/22 (!) 175.4 kg  06/18/22 (!) 170.1 kg      Other studies Reviewed: Additional studies/ records that were reviewed today include: Echocardiogram 04/03/22: severe LVH; EF 55-60%.   ASSESSMENT AND PLAN:  1.  Atrial fibrillation - persistent  Randy [name] has presented today for surgery, with the diagnosis of AF.  The various methods of treatment have been discussed with the patient and family. After consideration of risks, benefits and other options for treatment, the patient has consented to  Procedure(s): Catheter ablation as a surgical intervention .  Risks include but not limited to complete heart block, stroke, esophageal damage,  nerve damage, bleeding, vascular damage, tamponade, perforation, MI, and death. The patient's history has been reviewed, patient examined, no change in status, stable for surgery.  I have reviewed the patient's chart and labs.  Questions were answered to the patient's satisfaction.    Randy Rawlins Elberta Fortis, MD 10/02/2022 9:16 AM

## 2022-10-02 NOTE — Progress Notes (Signed)
Called Otilio Saber PA to inform him of pts BP, orders followed for some home meds.

## 2022-10-02 NOTE — Progress Notes (Signed)
  Echocardiogram Echocardiogram Transesophageal has been performed.  Delcie Roch 10/02/2022, 10:44 AM

## 2022-10-02 NOTE — Anesthesia Procedure Notes (Signed)
Arterial Line Insertion Start/End6/10/2022 10:33 AM, 10/02/2022 10:39 AM Performed by: Jairo Ben, MD, anesthesiologist  Patient location: OR. Preanesthetic checklist: patient identified, IV checked, surgical consent, monitors and equipment checked, pre-op evaluation, timeout performed and anesthesia consent Left, radial was placed Catheter size: 20 G Hand hygiene performed , maximum sterile barriers used  and Seldinger technique used Allen's test indicative of satisfactory collateral circulation Attempts: 2 Procedure performed without using ultrasound guided (no images, cath lab [place] used) technique. Ultrasound Notes:anatomy identified, needle tip was noted to be adjacent to the nerve/plexus identified and no ultrasound evidence of intravascular and/or intraneural injection Following insertion, Biopatch and dressing applied. Post procedure assessment: normal  Patient tolerated the procedure well with no immediate complications.

## 2022-10-02 NOTE — Anesthesia Postprocedure Evaluation (Signed)
Anesthesia Post Note  Patient: Randy [name]  Procedure(s) Performed: ATRIAL FIBRILLATION ABLATION TRANSESOPHAGEAL ECHOCARDIOGRAM     Patient location during evaluation: Cath Lab Anesthesia Type: General Level of consciousness: awake and alert, patient cooperative and oriented Pain management: pain level controlled Vital Signs Assessment: post-procedure vital signs reviewed and stable Respiratory status: nonlabored ventilation, spontaneous breathing and respiratory function stable Cardiovascular status: blood pressure returned to baseline and stable Postop Assessment: no apparent nausea or vomiting Anesthetic complications: no   There were no known notable events for this encounter.  Last Vitals:  Vitals:   10/02/22 0832 10/02/22 1232  BP: (!) 153/103   Pulse: 87   Resp: 20   Temp: 36.7 C 36.8 C  SpO2: 95%     Last Pain:  Vitals:   10/02/22 1232  TempSrc:   PainSc: 0-No pain                 Zanyah Lentsch,E. Sheilyn Boehlke

## 2022-10-02 NOTE — Interval H&P Note (Signed)
History and Physical Interval Note:  10/02/2022 10:11 AM  Randy [name]  has presented today for surgery, with the diagnosis of afib.  The various methods of treatment have been discussed with the patient and family. After consideration of risks, benefits and other options for treatment, the patient has consented to  Procedure(s): ATRIAL FIBRILLATION ABLATION (N/A) TRANSESOPHAGEAL ECHOCARDIOGRAM (N/A) as a surgical intervention.  The patient's history has been reviewed, patient examined, no change in status, stable for surgery.  I have reviewed the patient's chart and labs.  Questions were answered to the patient's satisfaction.     [name] [employer]

## 2022-10-02 NOTE — Discharge Instructions (Signed)

## 2022-10-02 NOTE — CV Procedure (Signed)
Procedure: TEE  Sedation: Per anesthesiology  Indication: Atrial fibrillation  Findings: Please see echo section for full report.   The patient is in atrial fibrillation with RVR.  Normal LV size with moderate concentric LV hypertrophy.  EF 25-30%, diffuse hypokinesis. RV mildly dilated with moderate-severe systolic dysfunction.  Moderate left atrial enlargement.  There is smoke but no thrombus present. Mild right atrial enlargement.  No PFO/ASD by color doppler.  Trivial MR.  Trivial TR.  Trileaflet aortic valve with no stenosis or regurgitation.   Proceed with AF ablation.   Marca Ancona 10/02/2022 10:39 AM

## 2022-10-05 ENCOUNTER — Encounter (HOSPITAL_COMMUNITY): Payer: Self-pay | Admitting: Cardiology

## 2022-10-05 LAB — POCT ACTIVATED CLOTTING TIME
Activated Clotting Time: 293 seconds
Activated Clotting Time: 317 seconds

## 2022-10-08 ENCOUNTER — Other Ambulatory Visit (INDEPENDENT_AMBULATORY_CARE_PROVIDER_SITE_OTHER): Payer: 59

## 2022-10-08 DIAGNOSIS — T462X5A Adverse effect of other antidysrhythmic drugs, initial encounter: Secondary | ICD-10-CM | POA: Diagnosis not present

## 2022-10-08 DIAGNOSIS — E0580 Other thyrotoxicosis without thyrotoxic crisis or storm: Secondary | ICD-10-CM

## 2022-10-08 LAB — T3, FREE: T3, Free: 2.8 pg/mL (ref 2.3–4.2)

## 2022-10-08 LAB — T4, FREE: Free T4: 1.72 ng/dL — ABNORMAL HIGH (ref 0.60–1.60)

## 2022-10-08 LAB — TSH: TSH: 0.78 u[IU]/mL (ref 0.35–5.50)

## 2022-10-13 ENCOUNTER — Other Ambulatory Visit (HOSPITAL_COMMUNITY): Payer: Self-pay | Admitting: Internal Medicine

## 2022-10-19 LAB — ECHO TEE: Height: 80 in

## 2022-10-27 ENCOUNTER — Ambulatory Visit (HOSPITAL_COMMUNITY): Payer: 59 | Admitting: Physician Assistant

## 2022-11-01 ENCOUNTER — Other Ambulatory Visit: Payer: Self-pay | Admitting: Internal Medicine

## 2022-11-01 DIAGNOSIS — E118 Type 2 diabetes mellitus with unspecified complications: Secondary | ICD-10-CM

## 2022-11-02 ENCOUNTER — Ambulatory Visit: Payer: 59 | Admitting: Internal Medicine

## 2022-11-03 ENCOUNTER — Ambulatory Visit (HOSPITAL_COMMUNITY): Payer: 59 | Admitting: Physician Assistant

## 2022-11-05 ENCOUNTER — Other Ambulatory Visit: Payer: Self-pay

## 2022-11-05 ENCOUNTER — Other Ambulatory Visit: Payer: Self-pay | Admitting: Internal Medicine

## 2022-11-05 ENCOUNTER — Other Ambulatory Visit (HOSPITAL_COMMUNITY): Payer: Self-pay | Admitting: Cardiology

## 2022-11-05 DIAGNOSIS — E118 Type 2 diabetes mellitus with unspecified complications: Secondary | ICD-10-CM

## 2022-11-05 MED ORDER — MOUNJARO 12.5 MG/0.5ML ~~LOC~~ SOAJ: 12.5000 mg | SUBCUTANEOUS | 3 refills | Status: DC

## 2022-11-05 MED ORDER — MOUNJARO 12.5 MG/0.5ML ~~LOC~~ SOAJ
12.5000 mg | SUBCUTANEOUS | 1 refills | Status: DC
Start: 2022-11-05 — End: 2022-11-05

## 2022-11-05 NOTE — Telephone Encounter (Signed)
Pt left VM on triage line requesting refill of Mounjaro  Detailed message left for pt instructing pt to contact PCP/endocriology (Dr Lafe Garin) for refills  As curtesy will route encounter with refill request

## 2022-11-12 ENCOUNTER — Other Ambulatory Visit (HOSPITAL_COMMUNITY): Payer: Self-pay | Admitting: Internal Medicine

## 2022-11-13 ENCOUNTER — Other Ambulatory Visit (HOSPITAL_COMMUNITY): Payer: Self-pay | Admitting: Adult Health

## 2022-11-19 ENCOUNTER — Other Ambulatory Visit: Payer: Self-pay | Admitting: *Deleted

## 2022-11-19 DIAGNOSIS — I4819 Other persistent atrial fibrillation: Secondary | ICD-10-CM

## 2022-11-19 DIAGNOSIS — I5022 Chronic systolic (congestive) heart failure: Secondary | ICD-10-CM

## 2022-11-19 NOTE — Progress Notes (Signed)
Randy Jorja Loa, MD 10/19/2022  3:11 PM EDT Back to Top    TEE during atrial fibrillation ablation with reduced ejection fraction likely due to tachycardia.  Randy need echo prior to next visit.

## 2022-11-19 NOTE — Addendum Note (Signed)
Addended by: Baird Lyons on: 11/19/2022 01:25 PM   Modules accepted: Orders

## 2022-11-23 ENCOUNTER — Ambulatory Visit (HOSPITAL_COMMUNITY)
Admission: RE | Admit: 2022-11-23 | Discharge: 2022-11-23 | Disposition: A | Discharge status: Home / Self Care | Payer: 59 | Source: Ambulatory Visit | Attending: Physician Assistant | Admitting: Physician Assistant

## 2022-11-23 ENCOUNTER — Encounter (HOSPITAL_COMMUNITY): Payer: Self-pay | Admitting: Physician Assistant

## 2022-11-23 VITALS — BP 130/72 | HR 89 | Ht >= 80 in | Wt 375.6 lb

## 2022-11-23 DIAGNOSIS — N183 Chronic kidney disease, stage 3 unspecified: Secondary | ICD-10-CM | POA: Insufficient documentation

## 2022-11-23 DIAGNOSIS — D6869 Other thrombophilia: Secondary | ICD-10-CM | POA: Diagnosis not present

## 2022-11-23 DIAGNOSIS — G4733 Obstructive sleep apnea (adult) (pediatric): Secondary | ICD-10-CM | POA: Diagnosis not present

## 2022-11-23 DIAGNOSIS — R9431 Abnormal electrocardiogram [ECG] [EKG]: Secondary | ICD-10-CM | POA: Insufficient documentation

## 2022-11-23 DIAGNOSIS — I130 Hypertensive heart and chronic kidney disease with heart failure and stage 1 through stage 4 chronic kidney disease, or unspecified chronic kidney disease: Secondary | ICD-10-CM | POA: Diagnosis not present

## 2022-11-23 DIAGNOSIS — E1122 Type 2 diabetes mellitus with diabetic chronic kidney disease: Secondary | ICD-10-CM | POA: Diagnosis not present

## 2022-11-23 DIAGNOSIS — I4819 Other persistent atrial fibrillation: Secondary | ICD-10-CM

## 2022-11-23 DIAGNOSIS — I5022 Chronic systolic (congestive) heart failure: Secondary | ICD-10-CM | POA: Insufficient documentation

## 2022-11-23 DIAGNOSIS — Z6841 Body Mass Index (BMI) 40.0 and over, adult: Secondary | ICD-10-CM | POA: Insufficient documentation

## 2022-11-23 DIAGNOSIS — I2510 Atherosclerotic heart disease of native coronary artery without angina pectoris: Secondary | ICD-10-CM | POA: Insufficient documentation

## 2022-11-23 DIAGNOSIS — E669 Obesity, unspecified: Secondary | ICD-10-CM | POA: Diagnosis not present

## 2022-11-23 DIAGNOSIS — I110 Hypertensive heart disease with heart failure: Secondary | ICD-10-CM | POA: Diagnosis present

## 2022-11-23 DIAGNOSIS — I4892 Unspecified atrial flutter: Secondary | ICD-10-CM | POA: Insufficient documentation

## 2022-11-23 NOTE — Progress Notes (Signed)
Primary Care Physician: Pcp, No Primary Cardiologist: Dr Gala Romney  Primary Electrophysiologist: Dr Elberta Fortis Referring Physician: Dr Hardin Negus Randy is a 52 y.o. [name] with a history of HFrEF, HTN, CKD, HLD, DM, CVA, OSA, CAD, atrial fibrillation who presents for follow up in the [facility]. He was admitted in 12/02/2019 with newly diagnosed A fib + ischemic CVA. He did not meet criteria for IV tPA. MRI showed large acute focal infarct, left frontal lobe predominantly ACA distribution. Echocardiogram obtained with bubble study showing no cardiac etiology for stroke. LVEF was 40 to 45%. Placed on Toprol XL and Eliquis for a CHDAS2VASC score of 6.    Followed at [facility] for cardiology services. Saw Cyndia Diver on 12/12/2019. Set up for sleep study.    Admitted 10/22 with A/C HFrEF and A fib RVR.  Echo showed EF down to 30-35% with speckled appears concerning for amyloid. Diuresed with IV lasix and placed on Amio drip. Once diuresed had TEE-DC-CV with restoration of NSR. Had RHC/LHC with min obs CAD, elevated filling pressures and normal cardiac output.  Diuresed 40 pounds. Repeat echo 3/23 showed EF 55% severe LVH.   His amiodarone was stopped 8/23 due to elevated thyroid tests. Started on methimazole 5 bid and prednisone. He saw Dr Gala Romney 04/22/22 and was found to be back in afib. Patient is s/p afib ablation with Dr Elberta Fortis on 10/02/22.  On follow up today, patient reports that he has done well since his ablation. He has noticed that he can walk much further without becoming fatigued. He denies chest pain, swallowing pain, or groin issues. No bleeding issues on anticoagulation.   Today, he denies symptoms of palpitations, chest pain, shortness of breath, orthopnea, PND, lower extremity edema, dizziness, presyncope, syncope, snoring, daytime somnolence, bleeding, or neurologic sequela. The patient is tolerating medications without difficulties and is  otherwise without complaint today.    Atrial Fibrillation Risk Factors:  he does have symptoms or diagnosis of sleep apnea. he is compliant with CPAP therapy. he does not have a history of rheumatic fever.   Atrial Fibrillation Management history:  Previous antiarrhythmic drugs: amiodarone  Previous cardioversions: 01/2021, 06/2021 Previous ablations: 10/02/22 Anticoagulation history: Eliquis   Past Medical History:  Diagnosis Date   CKD (chronic kidney disease) stage 3, GFR 30-59 ml/min (HCC)    baseline Cr 1.5-1.7   Decreased visual acuity    Left eye, resolved - hypertensive retinopathy   Diabetes mellitus 2012   Type II   Hilar adenopathy    on CT scan 02/2010, on rpt scan stable/improved.   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  Physical Exam: Vitals:   11/23/22 1504  BP: 130/72  Pulse: 89  Weight: (!) 170.4 kg  Height: 6\' 8"  (2.032 m)    GEN: Well nourished, well developed in no acute distress NECK: No JVD; No carotid bruits CARDIAC: Regular rate and rhythm, no murmurs, rubs, gallops RESPIRATORY:  Clear  to auscultation without rales, wheezing or rhonchi  ABDOMEN: Soft, non-tender, non-distended EXTREMITIES:  No edema; No deformity    Wt Readings from Last 3 Encounters:  11/23/22 (!) 170.4 kg  10/02/22 (!) 167.8 kg  08/12/22 (!) 175.4 kg    EKG today demonstrates  SR, PVC Vent. rate 89 BPM PR interval 180 ms QRS duration 96 ms QT/QTcB 402/489 ms  Echo 04/03/22 demonstrated   1. Left ventricular ejection fraction, by estimation, is 55 to  60%. Left  ventricular ejection fraction by PLAX is 56 %. The left ventricle has  normal function. The left ventricle has no regional wall motion  abnormalities. There is severe left ventricular hypertrophy. Left ventricular diastolic parameters are indeterminate. Elevated left ventricular end-diastolic pressure.   2. Right ventricular systolic function is normal. The right ventricular  size is normal.   3. Left atrial size was moderately dilated.   4. The mitral valve is normal in structure. No evidence of mitral valve  regurgitation. No evidence of mitral stenosis.   5. The aortic valve is tricuspid. Aortic valve regurgitation is not  visualized. No aortic stenosis is present.   6. Compared with the echo 06/2021, aortic root has increased in size from  3.9 cm to 4.2 cm. The ascending aorta has increased from 3.7 cm to 4.4 cm. Aortic dilatation noted. There is mild dilatation of the aortic root,  measuring 42 mm. There is moderate dilatation of the ascending aorta, measuring 44 mm.   7. The inferior vena cava is dilated in size with >50% respiratory  variability, suggesting right atrial pressure of 8 mmHg.   Epic records are reviewed at length today  CHA2DS2-VASc Score = 6  The patient's score is based upon: CHF History: 1 HTN History: 1 Diabetes History: 1 Stroke History: 2 Vascular Disease History: 1 Age Score: 0 Gender Score: 0       ASSESSMENT AND PLAN: Persistent Atrial Fibrillation/atrial flutter The patient's CHA2DS2-VASc score is 6, indicating a 9.7% annual risk of stroke.   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  S/p afib ablation 10/02/22 Patient appears to be maintaining SR Continue Eliquis 5 mg BID with no missed doses for 3 months post ablation.  Continue Toprol 100 mg daily  Secondary Hypercoagulable State (ICD10:  D68.69) The patient is at significant risk for stroke/thromboembolism based upon his CHA2DS2-VASc Score of 6.  Continue Apixaban (Eliquis).    Obesity Body mass index is 41.26 kg/m.  Encouraged lifestyle modification  OSA  Encouraged nightly CPAP  Chronic HFrEF EF on TEE prior to ablation 25-30% Followed in the [facility]. Fluid status appears stable today Repeat echo scheduled for 01/15/23  HTN Stable on current regimen  CAD Nonobstructive on LHC On statin No anginal symptoms   Follow up with Dr Elberta Fortis as scheduled.    Jorja Loa PA-C [facility] [address] [place], [place] [address] [phone] 11/23/2022 3:35 PM

## 2022-12-09 ENCOUNTER — Other Ambulatory Visit (HOSPITAL_COMMUNITY): Payer: Self-pay | Admitting: Internal Medicine

## 2022-12-09 ENCOUNTER — Telehealth: Payer: Self-pay | Admitting: Family

## 2022-12-09 NOTE — Telephone Encounter (Signed)
Having Erin sign handicap placard form and will be ready for pick up by tomorrow morning. Will advise patient once at the front desk to pick up.

## 2022-12-09 NOTE — Telephone Encounter (Signed)
Patient called needing to renew his handicap placard. The number to contact patient is [phone]

## 2022-12-09 NOTE — Telephone Encounter (Signed)
Pt informed ready for pick up tomorrow.

## 2022-12-22 ENCOUNTER — Telehealth: Payer: Self-pay | Admitting: Cardiology

## 2022-12-22 ENCOUNTER — Encounter: Payer: Self-pay | Admitting: Internal Medicine

## 2022-12-22 ENCOUNTER — Ambulatory Visit (INDEPENDENT_AMBULATORY_CARE_PROVIDER_SITE_OTHER): Payer: 59 | Admitting: Internal Medicine

## 2022-12-22 ENCOUNTER — Telehealth (HOSPITAL_COMMUNITY): Payer: Self-pay | Admitting: *Deleted

## 2022-12-22 VITALS — BP 144/100 | HR 83 | Ht >= 80 in | Wt 371.2 lb

## 2022-12-22 DIAGNOSIS — Z7985 Long-term (current) use of injectable non-insulin antidiabetic drugs: Secondary | ICD-10-CM

## 2022-12-22 DIAGNOSIS — E118 Type 2 diabetes mellitus with unspecified complications: Secondary | ICD-10-CM

## 2022-12-22 DIAGNOSIS — E0580 Other thyrotoxicosis without thyrotoxic crisis or storm: Secondary | ICD-10-CM

## 2022-12-22 DIAGNOSIS — T462X5A Adverse effect of other antidysrhythmic drugs, initial encounter: Secondary | ICD-10-CM

## 2022-12-22 DIAGNOSIS — Z7984 Long term (current) use of oral hypoglycemic drugs: Secondary | ICD-10-CM | POA: Diagnosis not present

## 2022-12-22 LAB — COMPREHENSIVE METABOLIC PANEL
ALT: 34 U/L (ref 0–53)
AST: 27 U/L (ref 0–37)
Albumin: 3.8 g/dL (ref 3.5–5.2)
Alkaline Phosphatase: 97 U/L (ref 39–117)
BUN: 21 mg/dL (ref 6–23)
CO2: 26 mEq/L (ref 19–32)
Calcium: 9.2 mg/dL (ref 8.4–10.5)
Chloride: 105 mEq/L (ref 96–112)
Creatinine, Ser: 1.88 mg/dL — ABNORMAL HIGH (ref 0.40–1.50)
GFR: 41.31 mL/min — ABNORMAL LOW (ref >60.00)
Glucose, Bld: 106 mg/dL — ABNORMAL HIGH (ref 70–99)
Potassium: 4 mEq/L (ref 3.5–5.1)
Sodium: 140 mEq/L (ref 135–145)
Total Bilirubin: 0.7 mg/dL (ref 0.2–1.2)
Total Protein: 7.3 g/dL (ref 6.0–8.3)

## 2022-12-22 LAB — POCT GLYCOSYLATED HEMOGLOBIN (HGB A1C): Hemoglobin A1C: 5.8 % — AB (ref 4.0–5.6)

## 2022-12-22 LAB — MICROALBUMIN / CREATININE URINE RATIO
Creatinine,U: 175.2 mg/dL
Microalb Creat Ratio: 128.9 mg/g — ABNORMAL HIGH (ref 0.0–30.0)
Microalb, Ur: 225.8 mg/dL — ABNORMAL HIGH (ref 0.0–1.9)

## 2022-12-22 LAB — LIPID PANEL
Cholesterol: 107 mg/dL (ref 0–200)
HDL: 48.7 mg/dL (ref >39.00)
LDL Cholesterol: 48 mg/dL (ref 0–99)
NonHDL: 58.29
Total CHOL/HDL Ratio: 2
Triglycerides: 49 mg/dL (ref 0.0–149.0)
VLDL: 9.8 mg/dL (ref 0.0–40.0)

## 2022-12-22 LAB — T3, FREE: T3, Free: 3.4 pg/mL (ref 2.3–4.2)

## 2022-12-22 LAB — TSH: TSH: 1.08 u[IU]/mL (ref 0.35–5.50)

## 2022-12-22 LAB — T4, FREE: Free T4: 1.39 ng/dL (ref 0.60–1.60)

## 2022-12-22 MED ORDER — ISOSORB DINITRATE-HYDRALAZINE 20-37.5 MG PO TABS: 2.0000 | ORAL_TABLET | Freq: Three times a day (TID) | ORAL | 5 refills | Status: DC

## 2022-12-22 NOTE — Telephone Encounter (Signed)
Pt's medication was sent to pt's pharmacy as requested. Confirmation received.  °

## 2022-12-22 NOTE — Patient Instructions (Addendum)
Please continue: - Jardiance 10 mg daily in am - Mounjaro 12.5 mg weekly  Please continue off Prednisone.  Please stop at the lab.  Please return in 3-4 months.

## 2022-12-22 NOTE — Telephone Encounter (Signed)
*  STAT* If patient is at the pharmacy, call can be transferred to refill team.   1. Which medications need to be refilled? (please list name of each medication and dose if known)   isosorbide-hydrALAZINE (BIDIL) 20-37.5 MG tablet   2. Would you like to learn more about the convenience, safety, & potential cost savings by using the [facility]?   3. Are you open to using the [facility] (Type [facility]. ).  4. Which pharmacy/location (including street and city if local pharmacy) is medication to be sent to?  [facility] [address] [place] - [address]   5. Do they need a 30 day or 90 day supply?  30 day  Patient stated he is running out of this medication and his insurance will only pay for 1 month at a time.

## 2022-12-22 NOTE — Telephone Encounter (Signed)
Patient left message on AF voicemail regarding prescription refill needed for Isosorbide-hydralazine. Pt states he is best reached at [phone]

## 2022-12-22 NOTE — Progress Notes (Signed)
Patient ID: Adoni Heinig, male   DOB: 01/28/1971, 52 y.o.   MRN: [id number]  HPI: Baron Steinback is a 52 y.o.-year-old male, initially referred by his cardiologist, Dr. 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  Last visit 07/2022.  Interim history: No increased urination, blurry vision, nausea, chest pain. He has a R foot ulcer for which he sees orthopedics - healing. He attached back his CGM since last visit but it came off again while in vacation.  DM2: Reviewed his HbA1c levels: Lab Results  Component Value Date   HGBA1C 6.4 (A) 08/12/2022   HGBA1C 5.9 (A) 05/06/2022   HGBA1C 10.1 (H) 09/14/2021   HGBA1C 6.3 (H) 01/27/2021   HGBA1C 7.4 (H) 08/08/2020   HGBA1C 12.5 (H) 11/28/2014   HGBA1C 12.5 (H) 11/10/2013   HGBA1C 6.6 (H) 04/03/2011   HGBA1C 6.6 (H) 09/05/2010   HGBA1C (H) 03/12/2010    6.6 (NOTE)                                                                       According to the ADA Clinical Practice Recommendations for 2011, when HbA1c is used as a screening test:   >=6.5%   Diagnostic of Diabetes Mellitus           (if abnormal result  is confirmed)  5.7-6.4%   Increased risk of developing Diabetes Mellitus  References:Diagnosis and Classification of Diabetes Mellitus,Diabetes Care,2011,34(Suppl 1):S62-S69 and Standards of Medical Care in         Diabetes - 2011,Diabetes Care,2011,34  (Suppl 1):S11-S61.   He is on a regimen of: - Jardiance 10 mg before breakfast - Mounjaro 15 mg weekly - started 11/2021 >> 12.5 mg weekly Metformin stopped 2/2 CKD, AP. He has not been on insulin.  Pt checks his sugars 0-1x a day (Libre 3 CGM came off 1 mo ago): - am: 60s-70s >> 60s (fasting now - religious), 120-130 >> 109-120  - 2h after b'fast: n/c - before lunch: n/c - 2h after  lunch: 110-120 >> n/c - before dinner: n/c - 2h after dinner: n/c >> 160-175 >> 160s usually, 180 (cake) - bedtime: n/c - nighttime: n/c Lowest sugar was 40 >> 70 >> 90s; he has hypoglycemia awareness at 40.  Highest sugar was 120 >> 170 >> 180  Glucometer:One Touch  Pt's meals are: - Breakfast: steak biscuit  - Lunch: eating out - Dinner: skips, or veggies - Snacks: used to drink gatorade, not anymore  - + CKD, last BUN/creatinine:  Lab Results  Component Value Date   BUN 24 09/18/2022   BUN 30 (H) 04/22/2022   CREATININE 2.16 (H) 09/18/2022   CREATININE 1.64 (H) 04/22/2022   Lab Results  Component Value Date   MICRALBCREAT 33.5 (H) 11/10/2013   MICRALBCREAT 81.8 (H) 05/29/2011  He is on Entresto.  - + HL; last set of lipids: Lab Results  Component Value Date   CHOL 136 05/06/2022   HDL 68.90 05/06/2022   LDLCALC 57 05/06/2022   LDLDIRECT 79.0 11/28/2014   TRIG 52.0 05/06/2022   CHOLHDL 2 05/06/2022  He is  on Lipitor 20 mg daily.  - last eye exam was in 11/2022. No DR reportedly. IOP is elevated, stable. [facility].  - no numbness and tingling in his feet. Last foot exam 03/24/2022: [name].  Pt has FH of DM - brother, sister, father.  Amiodarone-induced thyrotoxicosis:  Amiodarone was stopped 11/2021 due to thyrotoxicosis.  04/15/2022: following regimen started by Dr. Gala Romney: - Methimazole 5 mg twice a day - Prednisone 20 mg daily  04/2022: We decreased the doses: - Methimazole 5 mg once a day - Prednisone 10 mg daily  07/2022:  - stopped MMI - Reduced prednisone to 5 mg daily  09/2022: - Continued prednisone 5 mg daily as his fT4 was slightly elevated However, he stopped as he run out of the Rx >1 mo ago.  Reviewed TFTs: Lab Results  Component Value Date   TSH 0.78 10/08/2022   TSH 0.88 08/12/2022   TSH 0.03 (L) 05/06/2022   TSH 0.010 (L) 04/22/2022   TSH <0.010 (L) 03/25/2022   TSH <0.010 (L) 02/03/2022   TSH 0.103 (L)  11/21/2021   TSH 0.90 09/05/2010   TSH 1.345 03/13/2010   Lab Results  Component Value Date   FREET4 1.72 (H) 10/08/2022   FREET4 1.51 08/12/2022   FREET4 1.83 (H) 05/06/2022   FREET4 2.08 (H) 04/22/2022   FREET4 2.89 (H) 03/25/2022   FREET4 2.50 (H) 02/03/2022   FREET4 2.20 (H) 12/05/2021   Lab Results  Component Value Date   T3FREE 2.8 10/08/2022   T3FREE 3.1 08/12/2022   T3FREE 3.8 05/06/2022   T3FREE 2.3 04/22/2022   T3FREE 3.4 03/25/2022   T3FREE 2.0 02/03/2022   T3FREE 2.6 12/05/2021   Lab Results  Component Value Date   TSI <89 05/06/2022   No FH of thyroid cancer. (Wife has Graves ds). No h/o radiation tx to head or neck. No recent contrast studies. No herbal supplements. No Biotin use.   CTA neck (07/28/2020): Chronic moderate diffuse enlargement of the thyroid gland without significant heterogeneity or a discrete nodule and for which no imaging follow-up is recommended.  Pt denies: - feeling nodules in neck - hoarseness - dysphagia - choking  She also has a history of significant HTN, obesity- 450 lbs at his highest weight. He had a cardiac ablation in 09/2022.  ROS: + See HPI  Past Medical History:  Diagnosis Date   CKD (chronic kidney disease) stage 3, GFR 30-59 ml/min (HCC)    baseline Cr 1.5-1.7   Decreased visual acuity    Left eye, resolved - hypertensive retinopathy   Diabetes mellitus 2012   Type II   Hilar adenopathy    on CT scan 02/2010, on rpt scan stable/improved.   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   Systolic murmur    Vitamin D deficiency    Past Surgical History:  Procedure Laterality Date   AMPUTATION Right 09/17/2021    Procedure: First ray amputation;  Surgeon: Nadara Mustard, MD;  Location: [facility] OR;  Service: Orthopedics;  Laterality: Right;   APPLICATION OF WOUND VAC Right 10/10/2021   Procedure: APPLICATION OF WOUND VAC;  Surgeon: Nadara Mustard, MD;  Location: [facility] OR;  Service: Orthopedics;  Laterality: Right;   ATRIAL FIBRILLATION ABLATION N/A 10/02/2022   Procedure: ATRIAL FIBRILLATION ABLATION;  Surgeon: Regan Lemming, MD;  Location: [facility] INVASIVE CV LAB;  Service: Cardiovascular;  Laterality: N/A;   CARDIOVERSION N/A 01/31/2021   Procedure: CARDIOVERSION;  Surgeon: Dolores Patty, MD;  Location: [facility] ENDOSCOPY;  Service: Cardiovascular;  Laterality: N/A;   CARDIOVERSION N/A 07/04/2021   Procedure: CARDIOVERSION;  Surgeon: Dolores Patty, MD;  Location: [facility] ENDOSCOPY;  Service: Cardiovascular;  Laterality: N/A;   hospitalization  11/2008   malignant HTN, nl SPEP/UPEP, neg ANCA panel, nl C3/4, neg anti GBM Ab, neg Hep A/B/C, nl renal US, nl PTH, neg HIV   I & D EXTREMITY Right 10/10/2021   Procedure: RIGHT FOOT DEBRIDEMENT;  Surgeon: Nadara Mustard, MD;  Location: [facility] OR;  Service: Orthopedics;  Laterality: Right;   RIGHT/LEFT HEART CATH AND CORONARY ANGIOGRAPHY N/A 01/29/2021   Procedure: RIGHT/LEFT HEART CATH AND CORONARY ANGIOGRAPHY;  Surgeon: Dolores Patty, MD;  Location: [facility] INVASIVE CV LAB;  Service: Cardiovascular;  Laterality: N/A;   TEE WITHOUT CARDIOVERSION N/A 01/31/2021   Procedure: TRANSESOPHAGEAL ECHOCARDIOGRAM (TEE);  Surgeon: Dolores Patty, MD;  Location: [facility] ENDOSCOPY;  Service: Cardiovascular;  Laterality: N/A;   TEE WITHOUT CARDIOVERSION N/A 10/02/2022   Procedure: TRANSESOPHAGEAL ECHOCARDIOGRAM;  Surgeon: Regan Lemming, MD;  Location: [facility] INVASIVE CV LAB;  Service: Cardiovascular;  Laterality: N/A;   US ECHOCARDIOGRAPHY  11/2008   LVsys fxn EF 50%, mild MR, normal LV size, neg ANA, neg cryoglobulins   US ECHOCARDIOGRAPHY  2011   severe LVH, EF 40%, LA mildly dilated, PA  pressure moderately increased   Social History   Socioeconomic History   Marital status: Married    Spouse name: Austun Debaere   Number of children: 3   Years of education: Not on file   Highest education level: Bachelor's degree (e.g., BA, AB, BS)  Occupational History   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    Comment: [place]  Tobacco Use   Smoking status: Former    Types: Cigars   Smokeless tobacco: Never   Tobacco comments:    monthly, when going out  Vaping Use   Vaping status: Never Used  Substance and Sexual Activity   Alcohol use: Yes    Comment: Occasional   Drug use: No   Sexual activity: Not on file  Other Topics Concern   Not on file  Social History Narrative   Newly married, 2012, 1 daughter, 1 son   No injectable steroid cycles   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, 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)   Regular exercise-yes   Social Determinants of Health   Financial Resource Strain: Low Risk  (01/27/2021)   Overall Financial Resource Strain (CARDIA)    Difficulty of Paying Living Expenses: Not hard at all  Food Insecurity: No Food Insecurity (02/11/2021)   Hunger Vital Sign    Worried About Running Out of Food in the Last Year: Never true    Ran Out of Food in the Last Year: Never true  Transportation Needs: No Transportation Needs (01/27/2021)   PRAPARE - Administrator, Civil Service (Medical): No    Lack of Transportation (Non-Medical): No  Physical Activity: Not on file  Stress: Not on file  Social Connections: Unknown (09/06/2021)   Received from [facility]   Social Network    Social Network: Not on file  Intimate Partner Violence: Unknown (07/29/2021)   Received from [facility]   HITS    Physically Hurt: Not on file    Insult or Talk Down To: Not on file    Threaten Physical Harm: Not on file    Scream or Curse: Not on file   Current Outpatient Medications on File Prior to Visit  Medication Sig Dispense Refill    amLODipine (NORVASC) 5 MG tablet Take 1 tablet (5 mg total) by mouth daily. 90 tablet 3   apixaban (ELIQUIS) 5 MG TABS tablet Take 1 tablet (5 mg total) by mouth 2 (two) times daily. 180 tablet 3   atorvastatin (LIPITOR) 20 MG tablet Take 1 tablet (20 mg total) by mouth at bedtime. 90 tablet 3   Continuous Blood Gluc Sensor (FREESTYLE LIBRE 3 SENSOR) MISC 1 each by Does not apply route every 14 (fourteen) days. (Patient not taking: Reported on 11/23/2022) 6 each 3   ENTRESTO 97-103 MG Take 1 tablet by mouth twice daily 60 tablet 11   furosemide (LASIX) 20 MG tablet Take 2 tablets (40 mg total) by mouth daily. (Patient not taking: Reported on 11/23/2022) 90 tablet 3   isosorbide-hydrALAZINE (BIDIL) 20-37.5 MG tablet TAKE 2 TABLETS BY MOUTH THREE TIMES DAILY . APPOINTMENT REQUIRED FOR FUTURE REFILLS 60 tablet 0   JARDIANCE 10 MG TABS tablet Take 1 tablet by mouth once daily 90 tablet 0   metoprolol succinate (TOPROL-XL) 100 MG 24 hr tablet TAKE 1 TABLET BY MOUTH ONCE DAILY (TAKE  WITH  OR  IMMEDIATELY  FOLLOWING  A  MEAL) (Patient not taking: Reported on 11/23/2022) 90 tablet 1   pantoprazole (PROTONIX) 40 MG tablet Take 1 tablet (40 mg total) by mouth daily. (Patient not taking: Reported on 11/23/2022) 90 tablet 3   predniSONE (DELTASONE) 5 MG tablet Take 1 tablet (5 mg total) by mouth daily with breakfast. (Patient not taking: Reported on 11/23/2022) 30 tablet 1   tirzepatide (MOUNJARO) 12.5 MG/0.5ML Pen Inject 12.5 mg into the skin once a week. 6 mL 3   No current facility-administered medications on file prior to visit.   Allergies  Allergen Reactions   Fish-Derived Products Anaphylaxis   Family History  Problem Relation Age of Onset   Hypertension Mother    Hypertension Father    Diabetes Father    Diabetes Sister    Hypertension Brother    Kidney disease Paternal Grandmother        ESRD   Coronary artery disease Neg Hx    Stroke Neg Hx    Cancer Neg Hx    PE: BP (!) 144/100   Pulse  83   Ht 6\' 8"  (2.032 m)   Wt (!) 371 lb 3.2 oz (168.4 kg)   SpO2 98%   BMI 40.78 kg/m  Wt Readings from Last 3 Encounters:  12/22/22 (!) 371 lb 3.2 oz (168.4 kg)  11/23/22 (!) 375 lb 9.6 oz (170.4 kg)  10/02/22 (!) 370 lb (167.8 kg)   Constitutional: overweight, in NAD Eyes:  no exophthalmos, no lid lag, no stare ENT: no neck masses, no cervical lymphadenopathy Cardiovascular: RRR, No MRG, + significant bilateral leg swelling Respiratory: CTA B Musculoskeletal: no deformities Skin:no rashes Neurological: no tremor with outstretched hands  ASSESSMENT: 1. DM2, non-insulin-dependent, uncontrolled, with complications - CHF - A-fib with RVR - h/o CVA, CKD - osteomyelitis of the right foot, history of hallux amputation  2.  Amiodarone-induced thyrotoxicosis  PLAN:  1. Patient with longstanding type 2 diabetes, on  oral antidiabetic regimen with SGLT2 inhibitor and GLP-1/GIP receptor agonist, with drastically improved control after starting Mounjaro.  An HbA1c decreased from 10.1% to 5.9%.  Since then, HbA1c increased to 6.4%, still at goal (while off Mounjaro).  At last visits, we discussed about improving diet.  He was eating fast food in the morning on his way to work and was eating lunch out.  I advised him about reducing fat and salt in his diet and I also suggested a CGM.  He obtained this but it came off and at last visit he did not attach a new one.  I advised him to do so. -At last visit, sugars were at goal with exception of a period of time when he was doing fasting, when sugars were in the 60s in the morning.  He was off Mounjaro for 2 months at last visit as he could not find it at the pharmacy, but just restarted it 1 week prior to the appointment.  We did not change his regimen at that time. -At today's visit, sugars are well-controlled reportedly.  He had his CGM on, however, this came off.  At today's visit, I advised him to try to put it on the inside of his arm.  Or even on  the abdomen. -However, for now, since sugars are at goal and HbA1c has improved, we do not need to change her regimen. - I suggested to:  Patient Instructions  Please continue: - Jardiance 10 mg daily in am - Mounjaro 12.5 mg weekly  Please continue off Prednisone.  Please stop at the lab.  Please return in 3-4 months.  - we checked his HbA1c: 5.8% (lower) - advised to check sugars at different times of the day - 4x a day, rotating check times - advised for yearly eye exams >> he is UTD - return to clinic in 3-4 months  2.  Amiodarone induced thyrotoxicosis (AIT) -Patient with history of undetectable TSH and high free T4, in the setting of amiodarone treatment.  He did not have thyrotoxic symptoms: Weight loss, heat intolerance, hyperdefecation, palpitations, anxiety.  He did have a 21 pound weight gain, possibly related to fluid overload. -Amiodarone was stopped 11/2021 due to thyrotoxicosis.  At the same time, he was started on methimazole 5 mg BID and prednisone 20 mg daily.  He was also on Toprol XL 100 mg daily -He did not appear to have other culprit for thyrotoxicosis so our working diagnosis was amiodarone induced thyrotoxicosis.  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 -Type II AIT is managed with prednisone.  Therefore, I advised him to decrease both the methimazole and prednisone doses and at last visit his TFTs were still normal.  Therefore, we stopped methimazole completely and decrease prednisone to 5 mg daily. -Subsequent labs showed a normal TSH in 09/2022 but free T4 was slightly high so I advised him to continue the same dose of prednisone.  However, he came off prednisone soon after as he did not refill it from the pharmacy... -At today's visit, he has no thyrotoxic signs or symptoms -Will recheck his TFTs and see if we need to start prednisone or methimazole  Component     Latest Ref Rng 12/22/2022   Sodium     135 - 145 mEq/L 140   Potassium     3.5 - 5.1 mEq/L 4.0   Chloride     96 - 112 mEq/L 105  CO2     19 - 32 mEq/L 26   Glucose     70 - 99 mg/dL 409 (H)   BUN     6 - 23 mg/dL 21   Creatinine     8.11 - 1.50 mg/dL 9.14 (H)   Calcium     8.4 - 10.5 mg/dL 9.2   Cholesterol     0 - 200 mg/dL 782   Triglycerides     0.0 - 149.0 mg/dL 95.6   HDL Cholesterol     >39.00 mg/dL 21.30   Total CHOL/HDL Ratio 2   VLDL     0.0 - 40.0 mg/dL 9.8   LDL (calc)     0 - 99 mg/dL 48   Hemoglobin Q6V     4.0 - 5.6 % 5.8 !   Total Protein     6.0 - 8.3 g/dL 7.3   Albumin     3.5 - 5.2 g/dL 3.8   AST     0 - 37 U/L 27   ALT     0 - 53 U/L 34   Alkaline Phosphatase     39 - 117 U/L 97   Total Bilirubin     0.2 - 1.2 mg/dL 0.7   TSH     7.84 - 6.96 uIU/mL 1.08   T4,Free(Direct)     0.60 - 1.60 ng/dL 2.95   Triiodothyronine,Free,Serum     2.3 - 4.2 pg/mL 3.4   NonHDL 58.29   GFR     >60.00 mL/min 41.31 (L)   Microalb, Ur     0.0 - 1.9 mg/dL 284.1 (H)   Creatinine,U     mg/dL 324.4   MICROALB/CREAT RATIO     0.0 - 30.0 mg/g 128.9 (H)     TFTs normal >> will continue w/o MMI and Prednsione. ACR increased but GFR is improved.  He is on Entresto. We discussed to establish care with a PCP.   Carlus Pavlov, MD PhD [facility] Endocrinology

## 2022-12-23 NOTE — Telephone Encounter (Signed)
Done

## 2022-12-24 ENCOUNTER — Other Ambulatory Visit: Payer: Self-pay

## 2022-12-24 ENCOUNTER — Emergency Department (HOSPITAL_COMMUNITY): Payer: 59

## 2022-12-24 ENCOUNTER — Emergency Department (HOSPITAL_COMMUNITY)
Admission: EM | Admit: 2022-12-24 | Discharge: 2022-12-24 | Disposition: A | Discharge status: Home / Self Care | Payer: 59 | Attending: Emergency Medicine | Admitting: Emergency Medicine

## 2022-12-24 ENCOUNTER — Other Ambulatory Visit (HOSPITAL_COMMUNITY): Payer: Self-pay

## 2022-12-24 ENCOUNTER — Encounter (HOSPITAL_COMMUNITY): Payer: Self-pay

## 2022-12-24 ENCOUNTER — Telehealth (HOSPITAL_COMMUNITY): Payer: Self-pay | Admitting: Cardiology

## 2022-12-24 DIAGNOSIS — I483 Typical atrial flutter: Secondary | ICD-10-CM

## 2022-12-24 DIAGNOSIS — E1122 Type 2 diabetes mellitus with diabetic chronic kidney disease: Secondary | ICD-10-CM | POA: Insufficient documentation

## 2022-12-24 DIAGNOSIS — Z8673 Personal history of transient ischemic attack (TIA), and cerebral infarction without residual deficits: Secondary | ICD-10-CM | POA: Insufficient documentation

## 2022-12-24 DIAGNOSIS — Z7901 Long term (current) use of anticoagulants: Secondary | ICD-10-CM | POA: Insufficient documentation

## 2022-12-24 DIAGNOSIS — I130 Hypertensive heart and chronic kidney disease with heart failure and stage 1 through stage 4 chronic kidney disease, or unspecified chronic kidney disease: Secondary | ICD-10-CM | POA: Diagnosis not present

## 2022-12-24 DIAGNOSIS — N183 Chronic kidney disease, stage 3 unspecified: Secondary | ICD-10-CM | POA: Insufficient documentation

## 2022-12-24 DIAGNOSIS — I5020 Unspecified systolic (congestive) heart failure: Secondary | ICD-10-CM | POA: Diagnosis not present

## 2022-12-24 DIAGNOSIS — Z79899 Other long term (current) drug therapy: Secondary | ICD-10-CM | POA: Diagnosis not present

## 2022-12-24 DIAGNOSIS — R7989 Other specified abnormal findings of blood chemistry: Secondary | ICD-10-CM | POA: Diagnosis not present

## 2022-12-24 DIAGNOSIS — R079 Chest pain, unspecified: Secondary | ICD-10-CM | POA: Diagnosis present

## 2022-12-24 DIAGNOSIS — I4891 Unspecified atrial fibrillation: Secondary | ICD-10-CM | POA: Diagnosis not present

## 2022-12-24 LAB — CBC
HCT: 50.4 % (ref 39.0–52.0)
Hemoglobin: 15.7 g/dL (ref 13.0–17.0)
MCH: 25.3 pg — ABNORMAL LOW (ref 26.0–34.0)
MCHC: 31.2 g/dL (ref 30.0–36.0)
MCV: 81.3 fL (ref 80.0–100.0)
Platelets: 258 10*3/uL (ref 150–400)
RBC: 6.2 MIL/uL — ABNORMAL HIGH (ref 4.22–5.81)
RDW: 17.5 % — ABNORMAL HIGH (ref 11.5–15.5)
WBC: 10.6 10*3/uL — ABNORMAL HIGH (ref 4.0–10.5)
nRBC: 0 % (ref 0.0–0.2)

## 2022-12-24 LAB — BASIC METABOLIC PANEL
Anion gap: 14 (ref 5–15)
BUN: 20 mg/dL (ref 6–20)
CO2: 18 mmol/L — ABNORMAL LOW (ref 22–32)
Calcium: 8.6 mg/dL — ABNORMAL LOW (ref 8.9–10.3)
Chloride: 103 mmol/L (ref 98–111)
Creatinine, Ser: 2.07 mg/dL — ABNORMAL HIGH (ref 0.61–1.24)
GFR, Estimated: 39 mL/min — ABNORMAL LOW (ref >60)
Glucose, Bld: 112 mg/dL — ABNORMAL HIGH (ref 70–99)
Potassium: 3.6 mmol/L (ref 3.5–5.1)
Sodium: 135 mmol/L (ref 135–145)

## 2022-12-24 LAB — MAGNESIUM: Magnesium: 1.9 mg/dL (ref 1.7–2.4)

## 2022-12-24 LAB — TROPONIN I (HIGH SENSITIVITY)
Troponin I (High Sensitivity): 41 ng/L — ABNORMAL HIGH (ref <18)
Troponin I (High Sensitivity): 51 ng/L — ABNORMAL HIGH (ref <18)

## 2022-12-24 MED ORDER — METOPROLOL SUCCINATE ER 25 MG PO TB24
100.0000 mg | ORAL_TABLET | Freq: Every day | ORAL | Status: DC
  Administered 2022-12-24: 100 mg via ORAL
  Filled 2022-12-24: qty 4

## 2022-12-24 MED ORDER — METOPROLOL SUCCINATE ER 100 MG PO TB24
100.0000 mg | ORAL_TABLET | Freq: Every day | ORAL | 6 refills | Status: DC
  Filled 2022-12-24: qty 30, 30d supply, fill #0
  Filled 2023-01-18: qty 30, 30d supply, fill #1
  Filled 2023-02-25: qty 30, 30d supply, fill #2
  Filled 2023-03-22: qty 30, 30d supply, fill #3
  Filled 2023-04-26: qty 30, 30d supply, fill #4
  Filled 2023-06-03: qty 30, 30d supply, fill #5
  Filled 2023-06-28: qty 30, 30d supply, fill #6

## 2022-12-24 MED ORDER — LACTATED RINGERS IV SOLN: INTRAVENOUS | Status: DC

## 2022-12-24 MED ORDER — ETOMIDATE 2 MG/ML IV SOLN
10.0000 mg | Freq: Once | INTRAVENOUS | Status: AC
  Administered 2022-12-24: 10 mg via INTRAVENOUS
  Filled 2022-12-24: qty 10

## 2022-12-24 NOTE — ED Notes (Signed)
Patient no longer in NSR; back in atrial fibrillation/flutter; ED attending physician, Dr. Theresia Lo, made aware via face-to-face conversation; plan for repeat EKG.

## 2022-12-24 NOTE — Sedation Documentation (Signed)
12:04 PM 200J shock delivered by Dr. Theresia Lo at this time.

## 2022-12-24 NOTE — ED Triage Notes (Signed)
Reports chest pain that started at work.  Denies pain radiating anywhere or sob.  Patient does appear uncomfortable.  Denies n/v Hx of afib but had ablation.

## 2022-12-24 NOTE — Discharge Instructions (Signed)
In the emergency department for your heart racing.  You were in a rapid A-fib rhythm today.  You appeared mildly dehydrated on your labs but no signs of significant stress to your heart.  We did perform a cardioversion and you initially return to a normal rhythm before returning back into A-fib.  We spoke with cardiology who recommended starting you on metoprolol.  They would like you to take 100 mg daily and have sent a prescription to your pharmacy.  You can follow-up in the A-fib clinic and they should be calling you to schedule follow-up appointment however if you do not hear from them within the next few days you should call for your appointment.  You should return to the emergency department if you are having recurrent episodes of your heart racing, you have severe chest pain or shortness of breath, you pass out or if you have any other new or concerning symptoms.

## 2022-12-24 NOTE — ED Provider Notes (Signed)
[place] EMERGENCY DEPARTMENT AT [facility] Provider Note   CSN: [id number] Arrival date & time: 12/24/22  1002     History  Chief Complaint  Patient presents with   Chest Pain    Randy [name] is a 52 y.o. male.  Patient is a 52 year old male with a past medical history of A-fib on Eliquis, hypertension and diabetes that presented to the emergency department with heart palpitations.  The patient states that he woke up this morning feeling well and while at work he started to feel short of breath and like his heart was racing.  He states that he checked his heart rate and it was in the 120s to 130s which prompted him to come to the emergency department.  He states on arrival to the ED he started to develop some mild chest pressure.  He denies any recent fever or cough, nausea, vomiting or diarrhea.  He states that he does drink caffeinated tea but denies any other drug or alcohol use.  He states that he has been taking all his medications as prescribed and has not missed any doses of his blood thinners.  The history is provided by the patient.  Chest Pain      Home Medications Prior to Admission medications   Medication Sig Start Date End Date Taking? Authorizing Provider  apixaban (ELIQUIS) 5 MG TABS tablet Take 1 tablet (5 mg total) by mouth 2 (two) times daily. 03/02/22   Clegg, Amy D, NP  atorvastatin (LIPITOR) 20 MG tablet Take 1 tablet (20 mg total) by mouth at bedtime. 01/15/22   Bensimhon, Bevelyn Buckles, MD  Continuous Blood Gluc Sensor (FREESTYLE LIBRE 3 SENSOR) MISC 1 each by Does not apply route every 14 (fourteen) days. 05/06/22   Carlus Pavlov, MD  ENTRESTO 97-103 MG Take 1 tablet by mouth twice daily 01/21/22   Clegg, Amy D, NP  furosemide (LASIX) 20 MG tablet Take 2 tablets (40 mg total) by mouth daily. 04/22/22   Bensimhon, Bevelyn Buckles, MD  isosorbide-hydrALAZINE (BIDIL) 20-37.5 MG tablet Take 2 tablets by mouth 3 (three) times daily. 12/22/22   Camnitz, Will  Daphine Deutscher, MD  JARDIANCE 10 MG TABS tablet Take 1 tablet by mouth once daily 11/13/22   Bensimhon, Bevelyn Buckles, MD  metoprolol succinate (TOPROL-XL) 100 MG 24 hr tablet Take 1 tablet (100 mg total) by mouth at bedtime. Take with or immediately following a meal. 12/24/22   Tillery, Mariam Dollar, PA-C  pantoprazole (PROTONIX) 40 MG tablet Take 1 tablet (40 mg total) by mouth daily. 04/22/22   Bensimhon, Bevelyn Buckles, MD  tirzepatide [facility]) 12.5 MG/0.5ML Pen Inject 12.5 mg into the skin once a week. 11/05/22   Carlus Pavlov, MD      Allergies    Fish-derived products    Review of Systems   Review of Systems  Cardiovascular:  Positive for chest pain.    Physical Exam Updated Vital Signs BP 133/87   Pulse 60   Temp 98.4 F (36.9 C)   Resp 19   Ht 6\' 8"  (2.032 m)   Wt (!) 168.3 kg   SpO2 99%   BMI 40.76 kg/m  Physical Exam Vitals and nursing note reviewed.  Constitutional:      General: He is not in acute distress.    Appearance: He is well-developed.  HENT:     Head: Normocephalic.  Eyes:     Extraocular Movements: Extraocular movements intact.  Cardiovascular:     Rate and Rhythm: Tachycardia present.  Rhythm irregular.     Pulses:          Radial pulses are 2+ on the right side and 2+ on the left side.     Heart sounds: Normal heart sounds.  Pulmonary:     Effort: Pulmonary effort is normal.     Breath sounds: Normal breath sounds.  Abdominal:     Palpations: Abdomen is soft.     Tenderness: There is no abdominal tenderness.  Musculoskeletal:        General: Normal range of motion.     Cervical back: Normal range of motion and neck supple.     Right lower leg: No edema.     Left lower leg: No edema.  Skin:    General: Skin is warm and dry.  Neurological:     General: No focal deficit present.     Mental Status: He is alert and oriented to person, place, and time.  Psychiatric:        Mood and Affect: Mood normal.        Behavior: Behavior normal.     ED Results  / Procedures / Treatments   Labs (all labs ordered are listed, but only abnormal results are displayed) Labs Reviewed  BASIC METABOLIC PANEL - Abnormal; Notable for the following components:      Result Value   CO2 18 (*)    Glucose, Bld 112 (*)    Creatinine, Ser 2.07 (*)    Calcium 8.6 (*)    GFR, Estimated 39 (*)    All other components within normal limits  CBC - Abnormal; Notable for the following components:   WBC 10.6 (*)    RBC 6.20 (*)    MCH 25.3 (*)    RDW 17.5 (*)    All other components within normal limits  TROPONIN I (HIGH SENSITIVITY) - Abnormal; Notable for the following components:   Troponin I (High Sensitivity) 41 (*)    All other components within normal limits  TROPONIN I (HIGH SENSITIVITY) - Abnormal; Notable for the following components:   Troponin I (High Sensitivity) 51 (*)    All other components within normal limits  MAGNESIUM    EKG EKG Interpretation Date/Time:  Thursday December 24 2022 12:28:40 EDT Ventricular Rate:  129 PR Interval:  200 QRS Duration:  96 QT Interval:  345 QTC Calculation: 506 R Axis:   5  Text Interpretation: Atrial flutter with predominant 2:1 AV block Probable lateral infarct, age indeterminate Probable anteroseptal infarct, old Prolonged QT interval Now in atrial flutter with rapid ventricular response compared to prior EKG Confirmed by Elayne Snare ([id number]) on 12/24/2022 12:40:37 PM  Radiology DG Chest Port 1 View  Result Date: 12/24/2022 CLINICAL DATA:  Chest pain EXAM: PORTABLE CHEST 1 VIEW COMPARISON:  X-ray 01/26/2021 FINDINGS: Overlapping cardiac leads and defibrillator pads. Enlarged cardiopericardial silhouette. Slight prominence of the central vasculature. No pneumothorax, effusion or edema. Films are under penetrated. No consolidation. IMPRESSION: Enlarged cardiac pericardial silhouette with mild central vascular congestion. Under penetrated radiograph Electronically Signed   By: Karen Kays M.D.   On:  12/24/2022 13:18    Procedures .Critical Care  Performed by: Rexford Maus, DO Authorized by: Rexford Maus, DO   Critical care provider statement:    Critical care time (minutes):  40   Critical care time was exclusive of:  Separately billable procedures and treating other patients   Critical care was necessary to treat or prevent imminent or life-threatening deterioration of the  following conditions:  Cardiac failure   Critical care was time spent personally by me on the following activities:  Development of treatment plan with patient or surrogate, evaluation of patient's response to treatment, examination of patient, obtaining history from patient or surrogate, ordering and performing treatments and interventions, ordering and review of radiographic studies, ordering and review of laboratory studies, pulse oximetry, re-evaluation of patient's condition and review of old charts .Cardioversion  Date/Time: 12/24/2022 12:24 PM  Performed by: Rexford Maus, DO Authorized by: Rexford Maus, DO   Consent:    Consent obtained:  Written   Consent given by:  Patient   Risks discussed:  Cutaneous burn, death, induced arrhythmia and pain   Alternatives discussed:  No treatment, rate-control medication and delayed treatment Pre-procedure details:    Cardioversion basis:  Emergent   Rhythm:  Atrial fibrillation   Electrode placement:  Anterior-posterior Patient sedated: Yes. Refer to sedation procedure documentation for details of sedation.  Attempt one:    Cardioversion mode:  Synchronous   Waveform:  Biphasic   Shock (Joules):  200   Shock outcome:  Conversion to normal sinus rhythm Post-procedure details:    Patient status:  Awake   Patient tolerance of procedure:  Tolerated well, no immediate complications .Sedation  Date/Time: 12/24/2022 12:25 PM  Performed by: Rexford Maus, DO Authorized by: Rexford Maus, DO   Consent:    Consent  obtained:  Written   Consent given by:  Patient   Risks discussed:  Allergic reaction, prolonged hypoxia resulting in organ damage, prolonged sedation necessitating reversal, inadequate sedation, respiratory compromise necessitating ventilatory assistance and intubation, nausea and vomiting   Alternatives discussed:  Analgesia without sedation and anxiolysis Universal protocol:    Immediately prior to procedure, a time out was called: yes     Patient identity confirmed:  Verbally with patient Indications:    Procedure performed:  Cardioversion   Procedure necessitating sedation performed by:  Physician performing sedation Pre-sedation assessment:    Time since last food or drink:  3 hr   NPO status caution: urgency dictates proceeding with non-ideal NPO status     ASA classification: class 2 - patient with mild systemic disease     Mouth opening:  3 or more finger widths   Thyromental distance:  4 finger widths   Mallampati score:  II - soft palate, uvula, fauces visible   Neck mobility: normal     Pre-sedation assessments completed and reviewed: pre-procedure airway patency not reviewed, pre-procedure cardiovascular function not reviewed, pre-procedure hydration status not reviewed, pre-procedure mental status not reviewed, pre-procedure nausea and vomiting status not reviewed, pre-procedure pain level not reviewed, pre-procedure respiratory function not reviewed and pre-procedure temperature not reviewed   Immediate pre-procedure details:    Reassessment: Patient reassessed immediately prior to procedure     Reviewed: vital signs, relevant labs/tests and NPO status     Verified: bag valve mask available, emergency equipment available, intubation equipment available, IV patency confirmed, oxygen available, reversal medications available and suction available   Procedure details (see MAR for exact dosages):    Preoxygenation:  Nasal cannula   Sedation:  Etomidate   Intended level of  sedation: deep   Analgesia:  None   Intra-procedure monitoring:  Cardiac monitor, blood pressure monitoring, continuous capnometry, continuous pulse oximetry, frequent LOC assessments and frequent vital sign checks   Intra-procedure events: none     Total Provider sedation time (minutes):  10 Post-procedure details:    Attendance: Constant attendance  by certified staff until patient recovered     Recovery: Patient returned to pre-procedure baseline     Post-sedation assessments completed and reviewed: post-procedure airway patency not reviewed, post-procedure cardiovascular function not reviewed, post-procedure hydration status not reviewed, post-procedure mental status not reviewed, post-procedure nausea and vomiting status not reviewed, pain score not reviewed, post-procedure respiratory function not reviewed and post-procedure temperature not reviewed     Patient is stable for discharge or admission: yes     Procedure completion:  Tolerated well, no immediate complications     Medications Ordered in ED Medications  lactated ringers infusion (0 mLs Intravenous Stopped 12/24/22 1532)  metoprolol succinate (TOPROL-XL) 24 hr tablet 100 mg (100 mg Oral Given 12/24/22 1518)  etomidate (AMIDATE) injection 10 mg (10 mg Intravenous Given 12/24/22 1202)    ED Course/ Medical Decision Making/ A&P Clinical Course as of 12/24/22 1616  Thu Dec 24, 2022  1229 Patient underwent successful cardioversion into sinus rhythm. Shortly after RN notified me that he appears to be back into a fib with RVR. Will have repeat EKG and discuss with  [VK]  1239 I spoke with cardiology who will evaluate the patient for further recs. [VK]  [id number] Cardiology evaluated at bedside. Recommended metoprolol now and discharged on metoprolol. Can follow up outpatient. [VK]    Clinical Course User Index [VK] Rexford Maus, DO                                 Medical Decision Making This patient presents to the ED with  chief complaint(s) of palpitations with pertinent past medical history of a fib, HTN, DM which further complicates the presenting complaint. The complaint involves an extensive differential diagnosis and also carries with it a high risk of complications and morbidity.    The differential diagnosis includes ACS, arrhythmia, anemia, dehydration, electrolyte abnormality, pneumonia, pneumothorax, pulmonary edema, pleural effusion  Additional history obtained: Additional history obtained from spouse Records reviewed outpatient cardiology records  ED Course and Reassessment: On patient's arrival he was tachycardic to the 130s, and A-fib versus a flutter with rapid ventricular response on EKG.  Patient is hemodynamically stable and has been compliant on his A-fib with new onset of symptoms this morning.  I discussed with cardioversion versus rate control and the patient is agreeable with cardioversion.  Please see my procedure note.  The patient will of labs to evaluate for cause of his A-fib with RVR and will be closely reassessed.  Independent labs interpretation:  The following labs were independently interpreted: Mildly elevated creatinine from baseline, mildly elevated troponin, flat  Independent visualization of imaging: - I independently visualized the following imaging with scope of interpretation limited to determining acute life threatening conditions related to emergency care: Chest x-ray, which revealed no acute disease  Consultation: - Consulted or discussed management/test interpretation w/ external professional: Cardiology  Consideration for admission or further workup: Patient has no emergent conditions requiring admission or further work-up at this time and is stable for discharge home with cardiology follow-up  Social Determinants of health: n/a    Amount and/or Complexity of Data Reviewed Labs: ordered. Radiology: ordered. ECG/medicine tests: ordered.  Risk Prescription  drug management.          Final Clinical Impression(s) / ED Diagnoses Final diagnoses:  Atrial fibrillation with rapid ventricular response (HCC)    Rx / DC Orders ED Discharge Orders  Ordered    Amb referral to AFIB Clinic        12/24/22 1114    metoprolol succinate (TOPROL-XL) 100 MG 24 hr tablet  Daily at bedtime        12/24/22 1523              Rexford Maus, DO 12/24/22 1616

## 2022-12-24 NOTE — Telephone Encounter (Signed)
Patient called triage to report he is currently in ER with AFib,  Would like to notify Dr Gala Romney. (Unsure about ER providers performing procedure)   Advised I could notify HF team as FYI however could not guarantee team would visit/consult.  Pt appreciative.

## 2022-12-24 NOTE — Consult Note (Addendum)
ELECTROPHYSIOLOGY CONSULT NOTE    Patient ID: Randy [name] MRN: [id number], DOB/AGE: August 18, 1970 52 y.o.  Admit date: 12/24/2022 Date of Consult: 12/24/2022  Primary Physician: Pcp, No Primary Cardiologist: None  Electrophysiologist: Dr. Elberta Fortis   Referring Provider: Dr. Lavonna Rua  Patient Profile: Randy [name] is a 52 y.o. male with a history of HFrEF, HTN, CKD, HLD, DM2, CVA, OSA, CAD and persistent AF  who is being seen today for the evaluation of AF with RVR at the request of Dr. Lavonna Rua.  HPI:   Pt underwent AF ablation 10/02/2022 by Dr. Elberta Fortis with PVI and a box lesion.   Previously admitted 10/22 with A/C HFrEF and A fib RVR.  Echo showed EF down to 30-35% with speckled appears concerning for amyloid. Diuresed with IV lasix and placed on Amio drip. Once diuresed had TEE-DC-CV with restoration of NSR. Had RHC/LHC with min obs CAD, elevated filling pressures and normal cardiac output.  Diuresed 40 pounds. Repeat echo 3/23 showed EF 55% severe LVH.   His amiodarone was stopped 8/23 due to elevated thyroid tests.  TSH 0.103, Free T4 2.20, Free T3 WNL. Started on methimazole 5mg  bid and prednisone. He saw Dr Gala Romney 04/22/22 and was found to be back in afib  Pt seen in AF clinic 7/29 and appeared to be maintaining NSR  Pt reported chest pain at work this am and reported to [facility]. Found to be in AFL with RVR.  He was cardioverted and maintained NSR for ~ 20 minutes, but then reverted to AFL.  Has since transitioned to what appears to be course fib vs continued flutter.   Pt is feeling OK at rest. No further chest tightness. Denies SOB, edema, or exertional chest pain. No recent illness, N/V/D, or sick contacts. No new medications.   Noted that he is not on metoprolol.  In the system the last Rx was given 10/2021, and appears to have just fallen through and lapsed.   Labs Potassium3.6 (08/29 1027) Magnesium  1.9 (08/29 1027) Creatinine, ser  2.07* (08/29 1027) PLT  258 (08/29  1027) HGB  15.7 (08/29 1027) WBC 10.6* (08/29 1027) Troponin I (High Sensitivity)41* (08/29 1027).    Past Medical History:  Diagnosis Date   CKD (chronic kidney disease) stage 3, GFR 30-59 ml/min (HCC)    baseline Cr 1.5-1.7   Decreased visual acuity    Left eye, resolved - hypertensive retinopathy   Diabetes mellitus 2012   Type II   Hilar adenopathy    on CT scan 02/2010, on rpt scan stable/improved.   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   Systolic murmur    Vitamin D deficiency      Surgical History:  Past Surgical History:  Procedure Laterality Date   AMPUTATION Right 09/17/2021   Procedure: First ray amputation;  Surgeon: Nadara Mustard, MD;  Location: [facility] OR;  Service: Orthopedics;  Laterality: Right;   APPLICATION OF WOUND VAC Right 10/10/2021   Procedure: APPLICATION OF WOUND VAC;  Surgeon: Nadara Mustard, MD;  Location: [facility] OR;  Service: Orthopedics;  Laterality: Right;   ATRIAL FIBRILLATION ABLATION N/A 10/02/2022  Procedure: ATRIAL FIBRILLATION ABLATION;  Surgeon: Regan Lemming, MD;  Location: [facility] INVASIVE CV LAB;  Service: Cardiovascular;  Laterality: N/A;   CARDIOVERSION N/A 01/31/2021   Procedure: CARDIOVERSION;  Surgeon: Dolores Patty, MD;  Location: [facility] ENDOSCOPY;  Service: Cardiovascular;  Laterality: N/A;   CARDIOVERSION N/A 07/04/2021   Procedure: CARDIOVERSION;  Surgeon: Dolores Patty, MD;  Location: [facility] ENDOSCOPY;  Service: Cardiovascular;  Laterality: N/A;   hospitalization  11/2008   malignant HTN, nl SPEP/UPEP, neg ANCA panel, nl C3/4, neg anti GBM Ab, neg Hep A/B/C, nl renal US, nl PTH, neg HIV   I & D EXTREMITY Right 10/10/2021   Procedure:  RIGHT FOOT DEBRIDEMENT;  Surgeon: Nadara Mustard, MD;  Location[facility] OR;  Service: Orthopedics;  Laterality: Right;   RIGHT/LEFT HEART CATH AND CORONARY ANGIOGRAPHY N/A 01/29/2021   Procedure: RIGHT/LEFT HEART CATH AND CORONARY ANGIOGRAPHY;  Surgeon: Dolores Patty, MD;  Location: [facility] INVASIVE CV LAB;  Service: Cardiovascular;  Laterality: N/A;   TEE WITHOUT CARDIOVERSION N/A 01/31/2021   Procedure: TRANSESOPHAGEAL ECHOCARDIOGRAM (TEE);  Surgeon: Dolores Patty, MD;  Location: [facility] ENDOSCOPY;  Service: Cardiovascular;  Laterality: N/A;   TEE WITHOUT CARDIOVERSION N/A 10/02/2022   Procedure: TRANSESOPHAGEAL ECHOCARDIOGRAM;  Surgeon: Regan Lemming, MD;  Location: [facility] INVASIVE CV LAB;  Service: Cardiovascular;  Laterality: N/A;   US ECHOCARDIOGRAPHY  11/2008   LVsys fxn EF 50%, mild MR, normal LV size, neg ANA, neg cryoglobulins   US ECHOCARDIOGRAPHY  2011   severe LVH, EF 40%, LA mildly dilated, PA pressure moderately increased     (Not in a hospital admission)   Inpatient Medications:   Allergies:  Allergies  Allergen Reactions   Fish-Derived Products Anaphylaxis    Family History  Problem Relation Age of Onset   Hypertension Mother    Hypertension Father    Diabetes Father    Diabetes Sister    Hypertension Brother    Kidney disease Paternal Grandmother        ESRD   Coronary artery disease Neg Hx    Stroke Neg Hx    Cancer Neg Hx      Physical Exam: Vitals:   12/24/22 1159 12/24/22 1205 12/24/22 1210 12/24/22 1215  BP: 103/75 (!) 141/104 130/82 132/81  Pulse: (!) 123 97 95 94  Resp: (!) 23 (!) 22 (!) 21 (!) 21  Temp: 97.6 F (36.4 C)     TempSrc: Oral     SpO2: 100% 98% 99% 100%  Weight:      Height:        GEN- NAD, A&O x 3, normal affect HEENT: Normocephalic, atraumatic Lungs- CTAB, Normal effort.  Heart- Regular rate and rhythm, No M/G/R.  GI- Soft, NT, ND.  Extremities- No clubbing, cyanosis, or edema   Radiology/Studies:   [facility] 01/2021 Mild  non obstructive CAD, HTN, elevated filling pressures with normal CO  Echo 01/2021 LVEF 30-35% speckled appearing, concerning for amyloid Echo 06/2021 LVEF 55% Echo 04/03/2022 LVEF 55-60% TEE 10/19/2022 LVEF 25-30%, likely tachy    EKG:AF vs AFL at ~130 bpm (personally reviewed)  TELEMETRY: AFL 100-130s, NSR for a brief period after cardioversion, then back into AFL.  Now more irregular and potentially coarse fib vs continued flutter (personally reviewed)  Assessment/Plan:  Persistent atrial fibrillation Atrial flutter, Typical appearing.  S/p Ablation 09/2022 Amiodarone previously stopped for low TSH, moderately elevated T4 Not Class 1C candidate with CAD Qt is ~490 ms at baseline, making Tikosyn poor option Resume Toprol 100  mg daily. Can titrate as tolerated.  1 week AF clinic follow up.  Edgel Degnan plan for atrial flutter ablation and re-look at Pulmonary Veins +/- re do at next available time.    Chronic systolic CHF EF on TEE 6/24 25-30%, felt likely tachy-mediated Adding back Toprol as above. Stressed importance of medications.   Hypothyroidism Labs normalized with methimazole and prednisone and has since been off.  Could potentially use amiodarone briefly as a bridge to redo ablation, but Emre Stock try and avoid if possible.   We Oluwatomisin Deman resume BB and plan close follow up and ultimately plan Flutter +/- redo AF ablation.   For questions or updates, please contact [facility] Please consult [email or web address] for contact info under Cardiology/STEMI.  Signed, Graciella Freer, PA-C  12/24/2022 1:23 PM   I have seen and examined this patient with Otilio Saber.  Agree with above, note added to reflect my findings.  Patient with a history as above presented to the hospital in atrial flutter.  He has a history of atrial fibrillation and atrial flutter and is post ablation.  He felt poorly, and was tachycardic at home and thus he presented to the emergency room.  He had a cardioversion  which converted him to sinus rhythm for a short time, then quickly back into atrial flutter.  A second cardioversion was performed and again he went back into atrial flutter which degenerated into atrial fibrillation.  He is in atrial flutter now.  He is feeling well at rest.  GEN: Well nourished, well developed, in no acute distress  HEENT: normal  Neck: no JVD, carotid bruits, or masses Cardiac: Tachycardic, irregular Respiratory:  clear to auscultation bilaterally, normal work of breathing GI: soft, nontender, nondistended, + BS MS: no deformity or atrophy  Skin: warm and dry Neuro:  Strength and sensation are intact Psych: euthymic mood, full affect   Persistent atrial fibrillation/atrial flutter: Presented to the hospital in atrial flutter.  He has a history of ablation, but has not had ablation for this specific flutter.  He would benefit from further rhythm control.  He Villa Burgin likely need repeat ablation.  Deionte Spivack plan for rate control currently.  Tyteanna Ost start Toprol-XL 100 mg.  Emry Tobin have him follow-up in A-fib clinic to check rate control.  Toprol-XL can be increased to 200 mg.  Mandi Mattioli plan for ablation as an outpatient.  Saahil Herbster M. Poetry Cerro MD 12/24/2022 4:18 PM

## 2022-12-25 ENCOUNTER — Other Ambulatory Visit (HOSPITAL_COMMUNITY): Payer: Self-pay

## 2022-12-25 ENCOUNTER — Ambulatory Visit: Payer: 59 | Admitting: Family

## 2022-12-25 NOTE — Telephone Encounter (Signed)
Pt aware.

## 2022-12-29 ENCOUNTER — Ambulatory Visit: Payer: 59 | Admitting: Family

## 2023-01-01 ENCOUNTER — Encounter (HOSPITAL_COMMUNITY): Payer: Self-pay | Admitting: Physician Assistant

## 2023-01-01 ENCOUNTER — Ambulatory Visit (HOSPITAL_COMMUNITY)
Admit: 2023-01-01 | Discharge: 2023-01-01 | Disposition: A | Discharge status: Home / Self Care | Payer: 59 | Attending: Physician Assistant | Admitting: Physician Assistant

## 2023-01-01 VITALS — BP 146/88 | HR 69 | Ht >= 80 in | Wt 375.4 lb

## 2023-01-01 DIAGNOSIS — I4892 Unspecified atrial flutter: Secondary | ICD-10-CM | POA: Insufficient documentation

## 2023-01-01 DIAGNOSIS — I5022 Chronic systolic (congestive) heart failure: Secondary | ICD-10-CM | POA: Diagnosis not present

## 2023-01-01 DIAGNOSIS — Z713 Dietary counseling and surveillance: Secondary | ICD-10-CM | POA: Diagnosis not present

## 2023-01-01 DIAGNOSIS — Z8673 Personal history of transient ischemic attack (TIA), and cerebral infarction without residual deficits: Secondary | ICD-10-CM | POA: Diagnosis not present

## 2023-01-01 DIAGNOSIS — D6869 Other thrombophilia: Secondary | ICD-10-CM | POA: Insufficient documentation

## 2023-01-01 DIAGNOSIS — E1122 Type 2 diabetes mellitus with diabetic chronic kidney disease: Secondary | ICD-10-CM | POA: Diagnosis not present

## 2023-01-01 DIAGNOSIS — N183 Chronic kidney disease, stage 3 unspecified: Secondary | ICD-10-CM | POA: Insufficient documentation

## 2023-01-01 DIAGNOSIS — Z79899 Other long term (current) drug therapy: Secondary | ICD-10-CM | POA: Diagnosis not present

## 2023-01-01 DIAGNOSIS — Z7901 Long term (current) use of anticoagulants: Secondary | ICD-10-CM | POA: Insufficient documentation

## 2023-01-01 DIAGNOSIS — E0590 Thyrotoxicosis, unspecified without thyrotoxic crisis or storm: Secondary | ICD-10-CM | POA: Insufficient documentation

## 2023-01-01 DIAGNOSIS — I130 Hypertensive heart and chronic kidney disease with heart failure and stage 1 through stage 4 chronic kidney disease, or unspecified chronic kidney disease: Secondary | ICD-10-CM | POA: Insufficient documentation

## 2023-01-01 DIAGNOSIS — G4733 Obstructive sleep apnea (adult) (pediatric): Secondary | ICD-10-CM | POA: Diagnosis not present

## 2023-01-01 DIAGNOSIS — I2510 Atherosclerotic heart disease of native coronary artery without angina pectoris: Secondary | ICD-10-CM | POA: Diagnosis not present

## 2023-01-01 DIAGNOSIS — Z6841 Body Mass Index (BMI) 40.0 and over, adult: Secondary | ICD-10-CM | POA: Insufficient documentation

## 2023-01-01 DIAGNOSIS — I491 Atrial premature depolarization: Secondary | ICD-10-CM | POA: Insufficient documentation

## 2023-01-01 DIAGNOSIS — I4819 Other persistent atrial fibrillation: Secondary | ICD-10-CM | POA: Diagnosis present

## 2023-01-01 NOTE — Progress Notes (Signed)
Primary Care Physician: Pcp, No Primary Cardiologist: Dr Gala Romney  Primary Electrophysiologist: Dr Elberta Fortis Referring Physician: Dr Hardin [name] is a 52 y.o. male with a history of HFrEF, HTN, CKD, HLD, DM, CVA, OSA, CAD, atrial fibrillation who presents for follow up in the [facility]. He was admitted in 12/02/2019 with newly diagnosed A fib + ischemic CVA. He did not meet criteria for IV tPA. MRI showed large acute focal infarct, left frontal lobe predominantly ACA distribution. Echocardiogram obtained with bubble study showing no cardiac etiology for stroke. LVEF was 40 to 45%. Placed on Toprol XL and Eliquis for a CHDAS2VASC score of 6.    Followed at [facility] for cardiology services. Saw Cyndia Diver on 12/12/2019. Set up for sleep study.    Admitted 10/22 with A/C HFrEF and A fib RVR.  Echo showed EF down to 30-35% with speckled appears concerning for amyloid. Diuresed with IV lasix and placed on Amio drip. Once diuresed had TEE-DC-CV with restoration of NSR. Had RHC/LHC with min obs CAD, elevated filling pressures and normal cardiac output.  Diuresed 40 pounds. Repeat echo 3/23 showed EF 55% severe LVH.   His amiodarone was stopped 8/23 due to elevated thyroid tests. Started on methimazole 5 bid and prednisone. He saw Dr Gala Romney 04/22/22 and was found to be back in afib. Patient is s/p afib ablation with Dr Elberta Fortis on 10/02/22.  Patient was seen at the ED 12/24/22 with chest pain. He was cardioverted and maintained NSR for ~ 20 minutes, but then reverted to AFL. Has since transitioned to what appears to be course fib vs continued flutter.   On follow up today, patient has converted to SR since discharge. He feels well today. No bleeding issues on anticoagulation. He is not sure how metoprolol was discontinued.   Today, he denies symptoms of palpitations, chest pain, shortness of breath, orthopnea, PND, lower extremity edema, dizziness,  presyncope, syncope, snoring, daytime somnolence, bleeding, or neurologic sequela. The patient is tolerating medications without difficulties and is otherwise without complaint today.    Atrial Fibrillation Risk Factors:  he does have symptoms or diagnosis of sleep apnea. he is compliant with CPAP therapy. he does not have a history of rheumatic fever.   Atrial Fibrillation Management history:  Previous antiarrhythmic drugs: amiodarone  Previous cardioversions: 01/2021, 06/2021, 12/24/22 Previous ablations: 10/02/22 Anticoagulation history: Eliquis   Past Medical History:  Diagnosis Date   CKD (chronic kidney disease) stage 3, GFR 30-59 ml/min (HCC)    baseline Cr 1.5-1.7   Decreased visual acuity    Left eye, resolved - hypertensive retinopathy   Diabetes mellitus 2012   Type II   Hilar adenopathy    on CT scan 02/2010, on rpt scan stable/improved.   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  Physical Exam: Vitals:   01/01/23 0942  BP: (!) 146/88  Pulse: 69  Weight: (!) 170.3 kg  Height: 6\' 8"  (2.032 m)  GEN: Well nourished, well developed in no acute distress NECK: No JVD; No carotid bruits CARDIAC: Regular rate and rhythm, no murmurs, rubs, gallops RESPIRATORY:  Clear to auscultation without rales, wheezing or rhonchi  ABDOMEN: Soft, non-tender, non-distended EXTREMITIES:  No edema; No deformity    Wt Readings from Last 3 Encounters:  01/01/23 (!) 170.3 kg  12/24/22 (!) 168.3 kg  12/22/22 (!) 168.4 kg    EKG today demonstrates  SR with brief run  of ectopic atrial rhythm Vent. rate 69 BPM PR interval 208 ms QRS duration 96 ms QT/QTcB 446/477 ms  Echo 04/03/22 demonstrated   1. Left ventricular ejection fraction, by estimation, is 55 to 60%. Left  ventricular ejection fraction by PLAX is 56 %. The left ventricle has  normal function. The left ventricle has no regional wall motion  abnormalities. There is severe left ventricular hypertrophy. Left ventricular diastolic parameters are indeterminate. Elevated left ventricular end-diastolic pressure.   2. Right ventricular systolic function is normal. The right ventricular  size is normal.   3. Left atrial size was moderately dilated.   4. The mitral valve is normal in structure. No evidence of mitral valve  regurgitation. No evidence of mitral stenosis.   5. The aortic valve is tricuspid. Aortic valve regurgitation is not  visualized. No aortic stenosis is present.   6. Compared with the echo 06/2021, aortic root has increased in size from  3.9 cm to 4.2 cm. The ascending aorta has increased from 3.7 cm to 4.4 cm. Aortic dilatation noted. There is mild dilatation of the aortic root,  measuring 42 mm. There is moderate dilatation of the ascending aorta, measuring 44 mm.   7. The inferior vena cava is dilated in size with >50% respiratory  variability, suggesting right atrial pressure of 8 mmHg.   Epic records are reviewed at length today  CHA2DS2-VASc Score = 6  The patient's score is based upon: CHF History: 1 HTN History: 1 Diabetes History: 1 Stroke History: 2 Vascular Disease History: 1 Age Score: 0 Gender Score: 0       ASSESSMENT AND PLAN: Persistent Atrial Fibrillation/atrial flutter The patient's CHA2DS2-VASc score is 6, indicating a 9.7% annual risk of stroke.   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  S/p afib ablation 10/02/22 Plan for flutter ablation in the future per Dr Elberta Fortis Patient in SR today Continue Eliquis 5 mg BID  Continue Toprol 100 mg  daily  Secondary Hypercoagulable State (ICD10:  D68.69) The patient is at significant risk for stroke/thromboembolism based upon his CHA2DS2-VASc Score of 6.  Continue Apixaban (Eliquis).   Obesity Body mass index is 41.24 kg/m.  Encouraged lifestyle modification  OSA  Encouraged nightly CPAP  Chronic HFrEF EF on TEE prior to ablation 25-30% Followed in the [facility]. Repeat echo scheduled for 01/15/23 Fluid status appears stable  HTN Stable on current regimen  CAD Nonobstructive on LHC On statin No anginal symptoms   Follow up with Dr Elberta Fortis as scheduled.    Jorja Loa PA-C [facility] [address] [place], [place] [address] [phone] 01/01/2023 10:06 AM

## 2023-01-15 ENCOUNTER — Ambulatory Visit (HOSPITAL_COMMUNITY): Payer: 59 | Attending: Cardiology

## 2023-01-15 DIAGNOSIS — I5022 Chronic systolic (congestive) heart failure: Secondary | ICD-10-CM | POA: Diagnosis not present

## 2023-01-15 DIAGNOSIS — I4819 Other persistent atrial fibrillation: Secondary | ICD-10-CM | POA: Insufficient documentation

## 2023-01-15 LAB — ECHOCARDIOGRAM COMPLETE
Area-P 1/2: 4.51 cm2
S' Lateral: 4 cm

## 2023-01-18 ENCOUNTER — Other Ambulatory Visit (HOSPITAL_COMMUNITY): Payer: Self-pay

## 2023-01-21 ENCOUNTER — Emergency Department (HOSPITAL_COMMUNITY): Payer: 59

## 2023-01-21 ENCOUNTER — Inpatient Hospital Stay (HOSPITAL_COMMUNITY)
Admission: EM | Admit: 2023-01-21 | Discharge: 2023-01-29 | DRG: 872 | Disposition: A | Discharge status: Home / Self Care | Payer: 59 | Attending: Internal Medicine | Admitting: Internal Medicine

## 2023-01-21 ENCOUNTER — Encounter (HOSPITAL_COMMUNITY): Payer: Self-pay

## 2023-01-21 ENCOUNTER — Other Ambulatory Visit: Payer: Self-pay

## 2023-01-21 DIAGNOSIS — R6520 Severe sepsis without septic shock: Secondary | ICD-10-CM | POA: Diagnosis present

## 2023-01-21 DIAGNOSIS — L97519 Non-pressure chronic ulcer of other part of right foot with unspecified severity: Secondary | ICD-10-CM | POA: Diagnosis present

## 2023-01-21 DIAGNOSIS — R590 Localized enlarged lymph nodes: Secondary | ICD-10-CM | POA: Diagnosis present

## 2023-01-21 DIAGNOSIS — N179 Acute kidney failure, unspecified: Secondary | ICD-10-CM | POA: Diagnosis not present

## 2023-01-21 DIAGNOSIS — Z6841 Body Mass Index (BMI) 40.0 and over, adult: Secondary | ICD-10-CM

## 2023-01-21 DIAGNOSIS — R066 Hiccough: Secondary | ICD-10-CM | POA: Diagnosis not present

## 2023-01-21 DIAGNOSIS — E039 Hypothyroidism, unspecified: Secondary | ICD-10-CM | POA: Diagnosis present

## 2023-01-21 DIAGNOSIS — I4819 Other persistent atrial fibrillation: Secondary | ICD-10-CM | POA: Diagnosis present

## 2023-01-21 DIAGNOSIS — Z713 Dietary counseling and surveillance: Secondary | ICD-10-CM

## 2023-01-21 DIAGNOSIS — E559 Vitamin D deficiency, unspecified: Secondary | ICD-10-CM | POA: Diagnosis present

## 2023-01-21 DIAGNOSIS — E876 Hypokalemia: Secondary | ICD-10-CM | POA: Diagnosis present

## 2023-01-21 DIAGNOSIS — N1832 Chronic kidney disease, stage 3b: Secondary | ICD-10-CM | POA: Diagnosis present

## 2023-01-21 DIAGNOSIS — Z833 Family history of diabetes mellitus: Secondary | ICD-10-CM

## 2023-01-21 DIAGNOSIS — Z87891 Personal history of nicotine dependence: Secondary | ICD-10-CM

## 2023-01-21 DIAGNOSIS — E861 Hypovolemia: Secondary | ICD-10-CM | POA: Diagnosis present

## 2023-01-21 DIAGNOSIS — I483 Typical atrial flutter: Secondary | ICD-10-CM | POA: Diagnosis present

## 2023-01-21 DIAGNOSIS — E1122 Type 2 diabetes mellitus with diabetic chronic kidney disease: Secondary | ICD-10-CM | POA: Diagnosis present

## 2023-01-21 DIAGNOSIS — Z89411 Acquired absence of right great toe: Secondary | ICD-10-CM

## 2023-01-21 DIAGNOSIS — D509 Iron deficiency anemia, unspecified: Secondary | ICD-10-CM | POA: Diagnosis present

## 2023-01-21 DIAGNOSIS — R7401 Elevation of levels of liver transaminase levels: Secondary | ICD-10-CM | POA: Diagnosis present

## 2023-01-21 DIAGNOSIS — E871 Hypo-osmolality and hyponatremia: Secondary | ICD-10-CM | POA: Diagnosis present

## 2023-01-21 DIAGNOSIS — E872 Acidosis, unspecified: Secondary | ICD-10-CM | POA: Diagnosis present

## 2023-01-21 DIAGNOSIS — Z7901 Long term (current) use of anticoagulants: Secondary | ICD-10-CM

## 2023-01-21 DIAGNOSIS — Z7984 Long term (current) use of oral hypoglycemic drugs: Secondary | ICD-10-CM

## 2023-01-21 DIAGNOSIS — I4892 Unspecified atrial flutter: Secondary | ICD-10-CM | POA: Diagnosis not present

## 2023-01-21 DIAGNOSIS — L03115 Cellulitis of right lower limb: Secondary | ICD-10-CM | POA: Diagnosis present

## 2023-01-21 DIAGNOSIS — Z7985 Long-term (current) use of injectable non-insulin antidiabetic drugs: Secondary | ICD-10-CM

## 2023-01-21 DIAGNOSIS — I130 Hypertensive heart and chronic kidney disease with heart failure and stage 1 through stage 4 chronic kidney disease, or unspecified chronic kidney disease: Secondary | ICD-10-CM | POA: Diagnosis present

## 2023-01-21 DIAGNOSIS — Z79899 Other long term (current) drug therapy: Secondary | ICD-10-CM

## 2023-01-21 DIAGNOSIS — I5022 Chronic systolic (congestive) heart failure: Secondary | ICD-10-CM | POA: Diagnosis present

## 2023-01-21 DIAGNOSIS — E11622 Type 2 diabetes mellitus with other skin ulcer: Secondary | ICD-10-CM | POA: Diagnosis not present

## 2023-01-21 DIAGNOSIS — E785 Hyperlipidemia, unspecified: Secondary | ICD-10-CM | POA: Diagnosis present

## 2023-01-21 DIAGNOSIS — A419 Sepsis, unspecified organism: Principal | ICD-10-CM | POA: Diagnosis present

## 2023-01-21 DIAGNOSIS — M79661 Pain in right lower leg: Secondary | ICD-10-CM | POA: Diagnosis not present

## 2023-01-21 DIAGNOSIS — Z8673 Personal history of transient ischemic attack (TIA), and cerebral infarction without residual deficits: Secondary | ICD-10-CM

## 2023-01-21 DIAGNOSIS — Z8249 Family history of ischemic heart disease and other diseases of the circulatory system: Secondary | ICD-10-CM

## 2023-01-21 DIAGNOSIS — I4811 Longstanding persistent atrial fibrillation: Secondary | ICD-10-CM | POA: Diagnosis not present

## 2023-01-21 DIAGNOSIS — D6869 Other thrombophilia: Secondary | ICD-10-CM | POA: Diagnosis present

## 2023-01-21 DIAGNOSIS — E11621 Type 2 diabetes mellitus with foot ulcer: Secondary | ICD-10-CM | POA: Diagnosis present

## 2023-01-21 DIAGNOSIS — E66813 Obesity, class 3: Secondary | ICD-10-CM | POA: Diagnosis present

## 2023-01-21 DIAGNOSIS — Z91013 Allergy to seafood: Secondary | ICD-10-CM

## 2023-01-21 DIAGNOSIS — L97909 Non-pressure chronic ulcer of unspecified part of unspecified lower leg with unspecified severity: Secondary | ICD-10-CM | POA: Diagnosis not present

## 2023-01-21 LAB — BASIC METABOLIC PANEL
Anion gap: 10 (ref 5–15)
BUN: 17 mg/dL (ref 6–20)
CO2: 20 mmol/L — ABNORMAL LOW (ref 22–32)
Calcium: 8.3 mg/dL — ABNORMAL LOW (ref 8.9–10.3)
Chloride: 103 mmol/L (ref 98–111)
Creatinine, Ser: 2.06 mg/dL — ABNORMAL HIGH (ref 0.61–1.24)
GFR, Estimated: 39 mL/min — ABNORMAL LOW (ref >60)
Glucose, Bld: 170 mg/dL — ABNORMAL HIGH (ref 70–99)
Potassium: 3.4 mmol/L — ABNORMAL LOW (ref 3.5–5.1)
Sodium: 133 mmol/L — ABNORMAL LOW (ref 135–145)

## 2023-01-21 LAB — CBC WITH DIFFERENTIAL/PLATELET
Abs Immature Granulocytes: 0.09 10*3/uL — ABNORMAL HIGH (ref 0.00–0.07)
Basophils Absolute: 0 10*3/uL (ref 0.0–0.1)
Basophils Relative: 0 %
Eosinophils Absolute: 0.1 10*3/uL (ref 0.0–0.5)
Eosinophils Relative: 0 %
HCT: 42.4 % (ref 39.0–52.0)
Hemoglobin: 13.3 g/dL (ref 13.0–17.0)
Immature Granulocytes: 1 %
Lymphocytes Relative: 6 %
Lymphs Abs: 0.8 10*3/uL (ref 0.7–4.0)
MCH: 25.4 pg — ABNORMAL LOW (ref 26.0–34.0)
MCHC: 31.4 g/dL (ref 30.0–36.0)
MCV: 81.1 fL (ref 80.0–100.0)
Monocytes Absolute: 1.2 10*3/uL — ABNORMAL HIGH (ref 0.1–1.0)
Monocytes Relative: 8 %
Neutro Abs: 12.9 10*3/uL — ABNORMAL HIGH (ref 1.7–7.7)
Neutrophils Relative %: 85 %
Platelets: 216 10*3/uL (ref 150–400)
RBC: 5.23 MIL/uL (ref 4.22–5.81)
RDW: 15.9 % — ABNORMAL HIGH (ref 11.5–15.5)
WBC: 15.1 10*3/uL — ABNORMAL HIGH (ref 4.0–10.5)
nRBC: 0 % (ref 0.0–0.2)

## 2023-01-21 LAB — I-STAT CG4 LACTIC ACID, ED
Lactic Acid, Venous: 1 mmol/L (ref 0.5–1.9)
Lactic Acid, Venous: 2.5 mmol/L (ref 0.5–1.9)

## 2023-01-21 MED ORDER — ACETAMINOPHEN 500 MG PO TABS
1000.0000 mg | ORAL_TABLET | Freq: Once | ORAL | Status: AC
  Administered 2023-01-21: 1000 mg via ORAL
  Filled 2023-01-21: qty 2

## 2023-01-21 MED ORDER — IBUPROFEN 400 MG PO TABS
600.0000 mg | ORAL_TABLET | Freq: Once | ORAL | Status: AC
  Administered 2023-01-21: 600 mg via ORAL
  Filled 2023-01-21: qty 1

## 2023-01-21 MED ORDER — SODIUM CHLORIDE 0.9 % IV BOLUS
1000.0000 mL | Freq: Once | INTRAVENOUS | Status: AC
  Administered 2023-01-21: 1000 mL via INTRAVENOUS

## 2023-01-21 MED ORDER — INSULIN ASPART 100 UNIT/ML IJ SOLN
0.0000 [IU] | Freq: Three times a day (TID) | INTRAMUSCULAR | Status: DC
  Administered 2023-01-22: 1 [IU] via SUBCUTANEOUS

## 2023-01-21 MED ORDER — SODIUM CHLORIDE 0.9 % IV SOLN
2.0000 g | Freq: Three times a day (TID) | INTRAVENOUS | Status: DC
  Administered 2023-01-22 – 2023-01-26 (×13): 2 g via INTRAVENOUS
  Filled 2023-01-21 (×13): qty 12.5

## 2023-01-21 MED ORDER — SODIUM CHLORIDE 0.9 % IV SOLN
2.0000 g | Freq: Once | INTRAVENOUS | Status: AC
  Administered 2023-01-21: 2 g via INTRAVENOUS
  Filled 2023-01-21: qty 12.5

## 2023-01-21 MED ORDER — MORPHINE SULFATE (PF) 4 MG/ML IV SOLN
4.0000 mg | Freq: Once | INTRAVENOUS | Status: AC
  Administered 2023-01-21: 4 mg via INTRAVENOUS
  Filled 2023-01-21: qty 1

## 2023-01-21 MED ORDER — ACETAMINOPHEN 500 MG PO TABS
1000.0000 mg | ORAL_TABLET | Freq: Four times a day (QID) | ORAL | Status: DC | PRN
  Administered 2023-01-22 – 2023-01-26 (×3): 1000 mg via ORAL
  Filled 2023-01-21 (×4): qty 2

## 2023-01-21 MED ORDER — LACTATED RINGERS IV BOLUS
500.0000 mL | Freq: Once | INTRAVENOUS | Status: AC
  Administered 2023-01-22: 500 mL via INTRAVENOUS

## 2023-01-21 MED ORDER — ONDANSETRON HCL 4 MG/2ML IJ SOLN
4.0000 mg | Freq: Once | INTRAMUSCULAR | Status: AC
  Administered 2023-01-21: 4 mg via INTRAVENOUS
  Filled 2023-01-21: qty 2

## 2023-01-21 MED ORDER — ACETAMINOPHEN 325 MG PO TABS
650.0000 mg | ORAL_TABLET | Freq: Once | ORAL | Status: AC
  Administered 2023-01-21: 650 mg via ORAL
  Filled 2023-01-21: qty 2

## 2023-01-21 MED ORDER — VANCOMYCIN HCL 10 G IV SOLR
2500.0000 mg | Freq: Once | INTRAVENOUS | Status: AC
  Administered 2023-01-21: 2500 mg via INTRAVENOUS
  Filled 2023-01-21: qty 2500

## 2023-01-21 NOTE — ED Notes (Signed)
Pt to ultrasound

## 2023-01-21 NOTE — Progress Notes (Signed)
RLE venous duplex has been completed.  Preliminary results given to [employer], PA-C.    Results can be found under chart review under CV PROC. 01/21/2023 2:48 PM Terique Kawabata RVT, RDMS

## 2023-01-21 NOTE — Progress Notes (Signed)
ED Pharmacy Antibiotic Sign Off An antibiotic consult was received from an ED provider for vancomycin and cefepime per pharmacy dosing for sepsis. A chart review was completed to assess appropriateness.   The following one time order(s) were placed:  Vancomycin 2g IV x 1 Cefepime 2g IV x 1  Further antibiotic and/or antibiotic pharmacy consults should be ordered by the admitting provider if indicated.   Thank you for allowing pharmacy to be a part of this patient's care.   Daylene Posey, [facility]  Clinical Pharmacist 01/21/23 9:24 PM

## 2023-01-21 NOTE — ED Provider Notes (Signed)
Signout from [employer] at shift change. Briefly, patient presents for right leg pain, chills over the past 2 days.  He has a medical history including hypertension, chronic kidney disease, CVA on anticoagulation, congestive heart failure, diabetes, toe amputations.  He has a foot ulcer.   3:44 PM   Labs and imaging personally reviewed and interpreted including: White blood cell count elevated at 15.1, hemoglobin normal; BMP with elevated creatinine at 2.06 which is at baseline, glucose 170 with normal anion gap, slightly low sodium at 133 and potassium at 3.4.  Reviewed results of DVT study, shows no sign of right lower extremity DVT.   Most current vital signs reviewed and are as follows: BP (!) 146/96 (BP Location: Left Arm)   Pulse (!) 118   Temp (!) 100.6 F (38.1 C) (Oral)   Resp (!) 22   Ht 6\' 8"  (2.032 m)   Wt (!) 170.3 kg   SpO2 100%   BMI 41.24 kg/m   Plan: Will broaden infectious workup, lactate, cultures, viral panel, chest x-ray pending  4:21 PM Reassessment performed. Patient appears comfortable.  He does not appear toxic.  Right lower extremity does not appear to be visually impressive, however is very warm to the touch and patient has tenderness of the foot and ankle, lower leg.  No palpable crepitus. He has pain up into the groin area, likely related to lymphadenopathy.  Tylenol ordered.  Most current vital signs reviewed and are as follows: BP (!) 146/96 (BP Location: Left Arm)   Pulse (!) 118   Temp (!) 100.6 F (38.1 C) (Oral)   Resp (!) 22   Ht 6\' 8"  (2.032 m)   Wt (!) 170.3 kg   SpO2 100%   BMI 41.24 kg/m   Plan: Will obtain imaging with MRI without contrast of the lower leg.  Cannot tolerate CT due to renal function.  Anticipate admission to hospital but need to fully define extent of infection. R/o other causes.  9:06 PM Patient has remained stable during several rechecks. Discussed plan with family now at bedside. Continuing to await MRI results. Pt  was unable to tolerate tib/fib imaging due to pain but foot and ankle were done. Still has fever despite ibuprofen. Will order broad spectrum abx pending MRI results.   9:48 PM Additional 1000mg  tylenol for persistent fever.   10:23 PM I called and spoke with [facility] radiology.  I inquired about the patient's imaging and when I could expect that these would be read.  They state that no current [facility] rads are currently on to read imaging, would ask another radiologist for a "preliminary read".  Awaiting prelim read.  11:17 PM Reassessment performed. Patient appears stable.  Prelim read showed soft tissue infection and edema, nothing that appears immediately surgical.  Patient discussed with Dr. Lazarus Salines of [facility] hospitalist who will see for admission.  BP 126/76   Pulse (!) 126   Temp (!) 100.9 F (38.3 C) (Oral)   Resp 18   Ht 6\' 8"  (2.032 m)   Wt (!) 170.3 kg   SpO2 100%   BMI 41.24 kg/m        [place], [place], PA-C 01/21/23 2319    Coral Spikes, DO 02/03/23 1646

## 2023-01-21 NOTE — ED Notes (Signed)
Patient transported to X-ray 

## 2023-01-21 NOTE — Progress Notes (Signed)
Pharmacy Antibiotic Note  Randy [name] is a 52 y.o. male admitted on 01/21/2023 with right leg pain and chills over past 2 days and concerns for sepsis. DVT studies negative. Pharmacy has been consulted for vancomycin dosing.  WBC 15.1, sCr 2.06, Tmax 103>>100.9, Lactic acid 2.5 >1, blood cultures pending, received 1 dose of vancomycin and cefepime in ED  Plan: -Vancomycin 2g IV x1 -Vancomycin 1000mg  IV every 12 hours (AUC 445, IBW, Vd 0.5) -Cefepime 2g IV every 12 hours per MD -Monitor renal function -Follow up signs of clinical improvement, LOT, de-escalation of antibiotics, fever curve, and cultures  Height: 6\' 8"  (203.2 cm) Weight: (!) 170.3 kg (375 lb 7.1 oz) IBW/kg (Calculated) : 96  Temp (24hrs), Avg:101.6 F (38.7 C), Min:100.1 F (37.8 C), Max:103.1 F (39.5 C)  Recent Labs  Lab 01/21/23 1323 01/21/23 1655 01/21/23 1718  WBC 15.1*  --   --   CREATININE 2.06*  --   --   LATICACIDVEN  --  2.5* 1.0    Estimated Creatinine Clearance: 76.3 mL/min (A) (by C-G formula based on SCr of 2.06 mg/dL (H)).    Allergies  Allergen Reactions   Fish-Derived Products Anaphylaxis    Antimicrobials this admission: Cefepime 9/26 >>  Vancomycin 9/26 >>   Microbiology results: 9/26 BCx:   Thank you for allowing pharmacy to be a part of this patient's care.  Arabella Merles, PharmD. Clinical Pharmacist 01/22/2023 12:01 AM

## 2023-01-21 NOTE — ED Notes (Signed)
[place] RN attempting to place PIV at bedside.

## 2023-01-21 NOTE — ED Notes (Signed)
ED TO INPATIENT HANDOFF REPORT  ED Nurse Name and Phone #: [phone]  S Name/Age/Gender Randy [name] 52 y.o. male Room/Bed: H018C/H018C  Code Status   Code Status: Full Code  Home/SNF/Other Home Patient oriented to: self, place, time, and situation Is this baseline? Yes   Triage Complete: Triage complete  Chief Complaint Sepsis [facility]) [A41.9]  Triage Note Pt c/o right leg painx2d. Pt denies injury. Pt was able to walk to triage from waiting room. Pt c/o chillsx2d.   Allergies Allergies  Allergen Reactions   Fish-Derived Products Anaphylaxis    Level of Care/Admitting Diagnosis ED Disposition     ED Disposition  Admit   Condition  --   Comment  Hospital Area: [facility] [[id number]]  Level of Care: Telemetry Medical [[id number]]  May admit patient to [facility] or [facility] if equivalent level of care is available:: No  Covid Evaluation: Asymptomatic - no recent exposure (last 10 days) testing not required  Diagnosis: Sepsis [facility]) [[id number]]  Admitting Physician: Dolly Rias [[id number]]  Attending Physician: Dolly Rias [[id number]]  Certification:: I certify this patient will need inpatient services for at least 2 midnights  Expected Medical Readiness: 01/23/2023          B Medical/Surgery History Past Medical History:  Diagnosis Date   CKD (chronic kidney disease) stage 3, GFR 30-59 ml/min (HCC)    baseline Cr 1.5-1.7   Decreased visual acuity    Left eye, resolved - hypertensive retinopathy   Diabetes mellitus 2012   Type II   Hilar adenopathy    on CT scan 02/2010, on rpt scan stable/improved.   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   Systolic murmur    Vitamin D deficiency    Past Surgical History:  Procedure Laterality Date   AMPUTATION Right 09/17/2021   Procedure: First ray amputation;  Surgeon: Nadara Mustard, MD;  Location: [facility] OR;  Service: Orthopedics;  Laterality: Right;   APPLICATION OF WOUND VAC Right 10/10/2021   Procedure: APPLICATION OF WOUND VAC;  Surgeon: Nadara Mustard, MD;  Location: [facility] OR;  Service: Orthopedics;  Laterality: Right;   ATRIAL FIBRILLATION ABLATION N/A 10/02/2022   Procedure: ATRIAL FIBRILLATION ABLATION;  Surgeon: Regan Lemming, MD;  Location: [facility] INVASIVE CV LAB;  Service: Cardiovascular;  Laterality: N/A;   CARDIOVERSION N/A 01/31/2021   Procedure: CARDIOVERSION;  Surgeon: Dolores Patty, MD;  Location: [facility] ENDOSCOPY;  Service: Cardiovascular;  Laterality: N/A;   CARDIOVERSION N/A 07/04/2021   Procedure: CARDIOVERSION;  Surgeon: Dolores Patty, MD;  Location: [facility] ENDOSCOPY;  Service: Cardiovascular;  Laterality: N/A;   hospitalization  11/2008   malignant HTN, nl SPEP/UPEP, neg ANCA panel, nl C3/4, neg anti GBM Ab, neg Hep A/B/C, nl renal US, nl PTH, neg HIV   I & D EXTREMITY Right 10/10/2021   Procedure: RIGHT FOOT DEBRIDEMENT;  Surgeon: Nadara Mustard, MD;  Location: [facility] OR;  Service: Orthopedics;  Laterality: Right;   RIGHT/LEFT HEART CATH AND CORONARY ANGIOGRAPHY N/A 01/29/2021   Procedure: RIGHT/LEFT HEART CATH AND CORONARY ANGIOGRAPHY;  Surgeon: Dolores Patty, MD;  Location: [facility] INVASIVE CV  LAB;  Service: Cardiovascular;  Laterality: N/A;   TEE WITHOUT CARDIOVERSION N/A 01/31/2021   Procedure: TRANSESOPHAGEAL ECHOCARDIOGRAM (TEE);  Surgeon: Dolores Patty, MD;  Location: [facility] ENDOSCOPY;  Service: Cardiovascular;  Laterality: N/A;   TEE WITHOUT CARDIOVERSION N/A 10/02/2022   Procedure: TRANSESOPHAGEAL ECHOCARDIOGRAM;  Surgeon: Regan Lemming, MD;  Location: [facility] INVASIVE CV LAB;  Service: Cardiovascular;  Laterality: N/A;   US ECHOCARDIOGRAPHY  11/2008    LVsys fxn EF 50%, mild MR, normal LV size, neg ANA, neg cryoglobulins   US ECHOCARDIOGRAPHY  2011   severe LVH, EF 40%, LA mildly dilated, PA pressure moderately increased     A IV Location/Drains/Wounds Patient Lines/Drains/Airways Status     Active Line/Drains/Airways     Name Placement date Placement time Site Days   Peripheral IV 12/24/22 18 G Left Antecubital 12/24/22  1105  Antecubital  28   Peripheral IV 12/24/22 20 G Anterior;Right Forearm 12/24/22  1124  Forearm  28   Peripheral IV 01/21/23 20 G 1.88" Right Antecubital 01/21/23  1459  Antecubital  less than 1   Negative Pressure Wound Therapy Foot Right;Medial 10/10/21  0954  --  468            Intake/Output Last 24 hours  Intake/Output Summary (Last 24 hours) at 01/21/2023 2356 Last data filed at 01/21/2023 2352 Gross per 24 hour  Intake 500 ml  Output --  Net 500 ml    Labs/Imaging Results for orders placed or performed during the hospital encounter of 01/21/23 (from the past 48 hour(s))  Basic metabolic panel     Status: Abnormal   Collection Time: 01/21/23  1:23 PM  Result Value Ref Range   Sodium 133 (L) 135 - 145 mmol/L   Potassium 3.4 (L) 3.5 - 5.1 mmol/L   Chloride 103 98 - 111 mmol/L   CO2 20 (L) 22 - 32 mmol/L   Glucose, Bld 170 (H) 70 - 99 mg/dL    Comment: Glucose reference range applies only to samples taken after fasting for at least 8 hours.   BUN 17 6 - 20 mg/dL   Creatinine, Ser 2.95 (H) 0.61 - 1.24 mg/dL   Calcium 8.3 (L) 8.9 - 10.3 mg/dL   GFR, Estimated 39 (L) >60 mL/min    Comment: (NOTE) Calculated using the CKD-EPI Creatinine Equation (2021)    Anion gap 10 5 - 15    Comment: Performed at [facility], [address], [place], [place] [address]  CBC with Differential     Status: Abnormal   Collection Time: 01/21/23  1:23 PM  Result Value Ref Range   WBC 15.1 (H) 4.0 - 10.5 K/uL   RBC 5.23 4.22 - 5.81 MIL/uL   Hemoglobin 13.3 13.0 - 17.0 g/dL   HCT 24.4 01.0 - 27.2 %    MCV 81.1 80.0 - 100.0 fL   MCH 25.4 (L) 26.0 - 34.0 pg   MCHC 31.4 30.0 - 36.0 g/dL   RDW 53.6 (H) 64.4 - 03.4 %   Platelets 216 150 - 400 K/uL   nRBC 0.0 0.0 - 0.2 %   Neutrophils Relative % 85 %   Neutro Abs 12.9 (H) 1.7 - 7.7 K/uL   Lymphocytes Relative 6 %   Lymphs Abs 0.8 0.7 - 4.0 K/uL   Monocytes Relative 8 %   Monocytes Absolute 1.2 (H) 0.1 - 1.0 K/uL   Eosinophils Relative 0 %   Eosinophils Absolute 0.1 0.0 - 0.5 K/uL   Basophils Relative  0 %   Basophils Absolute 0.0 0.0 - 0.1 K/uL   Immature Granulocytes 1 %   Abs Immature Granulocytes 0.09 (H) 0.00 - 0.07 K/uL    Comment: Performed at [facility], [address] [place], [place] [address]  I-Stat Lactic Acid     Status: Abnormal   Collection Time: 01/21/23  4:55 PM  Result Value Ref Range   Lactic Acid, Venous 2.5 (HH) 0.5 - 1.9 mmol/L   Comment NOTIFIED PHYSICIAN   I-Stat Lactic Acid     Status: None   Collection Time: 01/21/23  5:18 PM  Result Value Ref Range   Lactic Acid, Venous 1.0 0.5 - 1.9 mmol/L   DG Foot Complete Right  Result Date: 01/21/2023 CLINICAL DATA:  2nd toe ulcer EXAM: RIGHT FOOT COMPLETE - 3+ VIEW COMPARISON:  09/14/2021. FINDINGS: Bone mineralization within normal limits for patient's age. Since the prior study, there is amputation of great toe. There is malunited fracture deformity of the second through fourth metatarsal. There are marked asymmetric degenerative changes of the second tarsometatarsal joint. No significant arthritis of other imaged joints. Note is made of pes planus deformity. No acute fracture or dislocation. No aggressive osseous lesion. No focal erosion noted along the tip of second toe. No evidence of air within the soft tissue. Ankle mortise appears intact. No radiopaque foreign bodies. IMPRESSION: *No radiographic evidence of osteomyelitis. If there is continued clinical concern, MRI is recommended. *Multiple other nonacute observations, as described above. Electronically  Signed   By: Jules Schick M.D.   On: 01/21/2023 17:56   VAS [place] LOWER EXTREMITY VENOUS (DVT) (7a-7p)  Result Date: 01/21/2023  Lower Venous DVT Study Patient Name:  Randy [name]  Date of Exam:   01/21/2023 Medical Rec #: [id number]       Accession #:    [id number] Date of Birth: [birth year]       Patient Gender: M Patient Age:   15 years Exam Location:  [facility] Procedure:      VAS [place] LOWER EXTREMITY VENOUS (DVT) Referring Phys: Lyn Hollingshead SCHUTT --------------------------------------------------------------------------------  Indications: Pain.  Anticoagulation: Eliquis (afib). Limitations: Poor ultrasound/tissue interface. Comparison Study: Previous exam on 09/14/2021 was negative for DVT. Performing Technologist: Ernestene Mention RVT, RDMS  Examination Guidelines: A complete evaluation includes B-mode imaging, spectral Doppler, color Doppler, and power Doppler as needed of all accessible portions of each vessel. Bilateral testing is considered an integral part of a complete examination. Limited examinations for reoccurring indications may be performed as noted. The reflux portion of the exam is performed with the patient in reverse Trendelenburg.  +---------+---------------+---------+-----------+----------+--------------+ RIGHT    CompressibilityPhasicitySpontaneityPropertiesThrombus Aging +---------+---------------+---------+-----------+----------+--------------+ CFV      Full           Yes      Yes                                 +---------+---------------+---------+-----------+----------+--------------+ SFJ      Full                                                        +---------+---------------+---------+-----------+----------+--------------+ FV Prox  Full           Yes      Yes                                 +---------+---------------+---------+-----------+----------+--------------+  FV Mid   Full           Yes      Yes                                  +---------+---------------+---------+-----------+----------+--------------+ FV DistalFull           Yes      Yes                                 +---------+---------------+---------+-----------+----------+--------------+ PFV      Full                                                        +---------+---------------+---------+-----------+----------+--------------+ POP      Full           Yes      Yes                                 +---------+---------------+---------+-----------+----------+--------------+ PTV      Full                                                        +---------+---------------+---------+-----------+----------+--------------+ PERO     Full                                                        +---------+---------------+---------+-----------+----------+--------------+   +----+---------------+---------+-----------+----------+--------------+ LEFTCompressibilityPhasicitySpontaneityPropertiesThrombus Aging +----+---------------+---------+-----------+----------+--------------+ CFV Full           Yes      Yes                                 +----+---------------+---------+-----------+----------+--------------+     Summary: RIGHT: - There is no evidence of deep vein thrombosis in the lower extremity.  - No cystic structure found in the popliteal fossa. - Ultrasound characteristics of enlarged lymph nodes are noted in the groin and popliteal fossa. - Subcutaneous fluid noted in area of calf and ankle.  LEFT: - No evidence of common femoral vein obstruction.   *See table(s) above for measurements and observations. Electronically signed by Heath Lark on 01/21/2023 at 5:09:29 PM.    Final     Pending Labs Unresulted Labs (From admission, onward)     Start     Ordered   01/22/23 0500  HIV Antibody (routine testing w rflx)  (HIV Antibody (Routine testing w reflex) panel)  Tomorrow morning,   R        01/21/23 2345   01/22/23 0500  Hepatic function  panel  Tomorrow morning,   R        01/21/23 2345   01/22/23 0500  Sedimentation rate  Tomorrow morning,   R        01/21/23 2345  01/22/23 0500  C-reactive protein  Tomorrow morning,   R        01/21/23 2345   01/22/23 0500  Basic metabolic panel  Tomorrow morning,   R        01/21/23 2345   01/22/23 0500  Magnesium  Tomorrow morning,   R        01/21/23 2345   01/22/23 0500  Phosphorus  Tomorrow morning,   R        01/21/23 2345   01/21/23 2346  Brain natriuretic peptide  Add-on,   AD        01/21/23 2345   01/21/23 2344  CBC  Once,   R        01/21/23 2345   01/21/23 2340  Urinalysis, Routine w reflex microscopic -Urine, Clean Catch  Once,   R       Question:  Specimen Source  Answer:  Urine, Clean Catch   01/21/23 2345   01/21/23 1528  Resp panel by RT-PCR (RSV, Flu A&B, Covid) Anterior Nasal Swab  Once,   URGENT        01/21/23 1527   01/21/23 1527  Urinalysis, Routine w reflex microscopic -Urine, Clean Catch  Once,   URGENT       Question:  Specimen Source  Answer:  Urine, Clean Catch   01/21/23 1527   01/21/23 1412  Culture, blood (routine x 2)  BLOOD CULTURE X 2,   R (with STAT occurrences)      01/21/23 1411            Vitals/Pain Today's Vitals   01/21/23 2253 01/21/23 2254 01/21/23 2352 01/21/23 2355  BP:  126/76    Pulse:  (!) 126    Resp:  18    Temp: (!) 100.9 F (38.3 C) (!) 100.9 F (38.3 C)    TempSrc: Oral Oral    SpO2:  100%    Weight:      Height:      PainSc:   3  2     Isolation Precautions No active isolations  Medications Medications  acetaminophen (TYLENOL) tablet 1,000 mg (has no administration in time range)  ceFEPIme (MAXIPIME) 2 g in sodium chloride 0.9 % 100 mL IVPB (has no administration in time range)  lactated ringers bolus 500 mL (has no administration in time range)  insulin aspart (novoLOG) injection 0-6 Units (has no administration in time range)  acetaminophen (TYLENOL) tablet 650 mg (650 mg Oral Given 01/21/23 1704)   ibuprofen (ADVIL) tablet 600 mg (600 mg Oral Given 01/21/23 2012)  morphine (PF) 4 MG/ML injection 4 mg (4 mg Intravenous Given 01/21/23 2103)  ondansetron (ZOFRAN) injection 4 mg (4 mg Intravenous Given 01/21/23 2104)  sodium chloride 0.9 % bolus 1,000 mL (1,000 mLs Intravenous New Bag/Given 01/21/23 2112)  ceFEPIme (MAXIPIME) 2 g in sodium chloride 0.9 % 100 mL IVPB (0 g Intravenous Stopped 01/21/23 2140)  vancomycin (VANCOCIN) 2,500 mg in sodium chloride 0.9 % 500 mL IVPB (0 mg Intravenous Stopped 01/21/23 2352)  acetaminophen (TYLENOL) tablet 1,000 mg (1,000 mg Oral Given 01/21/23 2207)    Mobility walks     Focused Assessments Cardiac Assessment Handoff:    Lab Results  Component Value Date   CKTOTAL 91 09/14/2021   CKMB 4.0 03/13/2010   TROPONINI 0.04 (H) 09/25/2015   Lab Results  Component Value Date   DDIMER  03/12/2010    0.31        AT THE INHOUSE ESTABLISHED  CUTOFF VALUE OF 0.48 ug/mL FEU, THIS ASSAY HAS BEEN DOCUMENTED IN THE LITERATURE TO HAVE A SENSITIVITY AND NEGATIVE PREDICTIVE VALUE OF AT LEAST 98 TO 99%.  THE TEST RESULT SHOULD BE CORRELATED WITH AN ASSESSMENT OF THE CLINICAL PROBABILITY OF DVT / VTE.   Does the Patient currently have chest pain? No    R Recommendations: See Admitting Provider Note  Report given to:   Additional Notes:

## 2023-01-21 NOTE — ED Triage Notes (Signed)
Pt c/o right leg painx2d. Pt denies injury. Pt was able to walk to triage from waiting room. Pt c/o chillsx2d.

## 2023-01-21 NOTE — ED Notes (Signed)
Going to MRI.

## 2023-01-21 NOTE — ED Provider Notes (Signed)
[place] EMERGENCY DEPARTMENT AT [facility] Provider Note   CSN: [id number] Arrival date & time: 01/21/23  1255     History  Chief Complaint  Patient presents with   Leg Pain    Randy [name] is a 52 y.o. male.  With history of hypertension, CKD, CVA on Eliquis, CHF, diabetes presenting to the ED for evaluation of right upper leg pain.  This pain began 2 days ago.  It is localized to the posterior of the leg.  He denies any trauma or falls.  He reports compliance with his Eliquis.  Denies history of DVT or PE.  He denies any fevers but reports some chills.  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  He states he noticed a small wound to the plantar surface of the distal phalanx of the second toe 2 weeks ago.  He has seen podiatry for this.  He is not on antibiotics.   Leg Pain      Home Medications Prior to Admission medications   Medication Sig Start Date End Date Taking? Authorizing Provider  apixaban (ELIQUIS) 5 MG TABS tablet Take 1 tablet (5 mg total) by mouth 2 (two) times daily. 03/02/22   Clegg, Amy D, NP  atorvastatin (LIPITOR) 20 MG tablet Take 1 tablet (20 mg total) by mouth at bedtime. 01/15/22   Bensimhon, Bevelyn Buckles, MD  Continuous Blood Gluc Sensor (FREESTYLE LIBRE 3 SENSOR) MISC 1 each by Does not apply route every 14 (fourteen) days. 05/06/22   Carlus Pavlov, MD  ENTRESTO 97-103 MG Take 1 tablet by mouth twice daily 01/21/22   Clegg, Amy D, NP  furosemide (LASIX) 20 MG tablet Take 2 tablets (40 mg total) by mouth daily. 04/22/22   Bensimhon, Bevelyn Buckles, MD  isosorbide-hydrALAZINE (BIDIL) 20-37.5 MG tablet Take 2 tablets by mouth 3 (three) times daily. 12/22/22   Camnitz, Will Daphine Deutscher, MD  JARDIANCE 10 MG TABS tablet Take 1 tablet by mouth once daily 11/13/22   Bensimhon, Bevelyn Buckles, MD  metoprolol succinate (TOPROL-XL) 100 MG 24 hr tablet Take 1 tablet (100 mg total) by mouth at bedtime. Take with or immediately  following a meal. 12/24/22   Tillery, Mariam Dollar, PA-C  tirzepatide [employer]) 12.5 MG/0.5ML Pen Inject 12.5 mg into the skin once a week. 11/05/22   Carlus Pavlov, MD      Allergies    Fish-derived products    Review of Systems   Review of Systems  Cardiovascular:  Positive for leg swelling.  Musculoskeletal:  Positive for myalgias.  All other systems reviewed and are negative.   Physical Exam Updated Vital Signs BP (!) 146/96 (BP Location: Left Arm)   Pulse (!) 118   Temp (!) 100.6 F (38.1 C) (Oral)   Resp (!) 22   Ht 6\' 8"  (2.032 m)   Wt (!) 170.3 kg   SpO2 100%   BMI 41.24 kg/m  Physical Exam Vitals and nursing note reviewed.  Constitutional:      General: He is not in acute distress.    Appearance: Normal appearance. He is normal weight. He is not ill-appearing.  HENT:     Head: Normocephalic and atraumatic.  Pulmonary:     Effort: Pulmonary effort is normal. No respiratory distress.  Abdominal:     General: Abdomen is flat.  Musculoskeletal:        General: Normal range of motion.     Cervical back: Neck supple.  Comments: Resting comfortably in bed.  2+ pitting edema to bilateral lower extremities.  Feet:     Comments: 1 cm x 1 cm ulcerated lesion to the plantar surface of the second digit Skin:    General: Skin is warm and dry.  Neurological:     Mental Status: He is alert and oriented to person, place, and time.  Psychiatric:        Mood and Affect: Mood normal.        Behavior: Behavior normal.     ED Results / Procedures / Treatments   Labs (all labs ordered are listed, but only abnormal results are displayed) Labs Reviewed  BASIC METABOLIC PANEL - Abnormal; Notable for the following components:      Result Value   Sodium 133 (*)    Potassium 3.4 (*)    CO2 20 (*)    Glucose, Bld 170 (*)    Creatinine, Ser 2.06 (*)    Calcium 8.3 (*)    GFR, Estimated 39 (*)    All other components within normal limits  CBC WITH  DIFFERENTIAL/PLATELET - Abnormal; Notable for the following components:   WBC 15.1 (*)    MCH 25.4 (*)    RDW 15.9 (*)    Neutro Abs 12.9 (*)    Monocytes Absolute 1.2 (*)    Abs Immature Granulocytes 0.09 (*)    All other components within normal limits  CULTURE, BLOOD (ROUTINE X 2)  CULTURE, BLOOD (ROUTINE X 2)  RESP PANEL BY RT-PCR (RSV, FLU A&B, COVID)  RVPGX2  URINALYSIS, ROUTINE W REFLEX MICROSCOPIC  I-STAT CG4 LACTIC ACID, ED    EKG None  Radiology VAS [place] LOWER EXTREMITY VENOUS (DVT) (7a-7p)  Result Date: 01/21/2023  Lower Venous DVT Study Patient Name:  Randy [name]  Date of Exam:   01/21/2023 Medical Rec #: [id number]       Accession #:    [id number] Date of Birth: 18-Apr-1971       Patient Gender: M Patient Age:   80 years Exam Location:  [facility] Procedure:      VAS [place] LOWER EXTREMITY VENOUS (DVT) Referring Phys: Lyn Hollingshead Anibal Quinby --------------------------------------------------------------------------------  Indications: Pain.  Anticoagulation: Eliquis (afib). Limitations: Poor ultrasound/tissue interface. Comparison Study: Previous exam on 09/14/2021 was negative for DVT. Performing Technologist: Ernestene Mention RVT, RDMS  Examination Guidelines: A complete evaluation includes B-mode imaging, spectral Doppler, color Doppler, and power Doppler as needed of all accessible portions of each vessel. Bilateral testing is considered an integral part of a complete examination. Limited examinations for reoccurring indications may be performed as noted. The reflux portion of the exam is performed with the patient in reverse Trendelenburg.  +---------+---------------+---------+-----------+----------+--------------+ RIGHT    CompressibilityPhasicitySpontaneityPropertiesThrombus Aging +---------+---------------+---------+-----------+----------+--------------+ CFV      Full           Yes      Yes                                  +---------+---------------+---------+-----------+----------+--------------+ SFJ      Full                                                        +---------+---------------+---------+-----------+----------+--------------+ FV Prox  Full  Yes      Yes                                 +---------+---------------+---------+-----------+----------+--------------+ FV Mid   Full           Yes      Yes                                 +---------+---------------+---------+-----------+----------+--------------+ FV DistalFull           Yes      Yes                                 +---------+---------------+---------+-----------+----------+--------------+ PFV      Full                                                        +---------+---------------+---------+-----------+----------+--------------+ POP      Full           Yes      Yes                                 +---------+---------------+---------+-----------+----------+--------------+ PTV      Full                                                        +---------+---------------+---------+-----------+----------+--------------+ PERO     Full                                                        +---------+---------------+---------+-----------+----------+--------------+   +----+---------------+---------+-----------+----------+--------------+ LEFTCompressibilityPhasicitySpontaneityPropertiesThrombus Aging +----+---------------+---------+-----------+----------+--------------+ CFV Full           Yes      Yes                                 +----+---------------+---------+-----------+----------+--------------+    Summary: RIGHT: - There is no evidence of deep vein thrombosis in the lower extremity.  - No cystic structure found in the popliteal fossa. - Ultrasound characteristics of enlarged lymph nodes are noted in the groin and popliteal fossa. - Subcutaneous fluid noted in area of calf and ankle.  LEFT:  - No evidence of common femoral vein obstruction.   *See table(s) above for measurements and observations.    Preliminary     Procedures Procedures    Medications Ordered in ED Medications - No data to display  ED Course/ Medical Decision Making/ A&P                                 Medical Decision Making Amount and/or Complexity of Data Reviewed Labs: ordered. Radiology: ordered.  This patient presents to the ED for concern of right leg pain, this involves an extensive number of treatment options, and is a complaint that carries with it a high risk of complications and morbidity.  The differential diagnosis includes cellulitis, DVT, contusion, MSK pain  My initial workup includes labs, DVT study  Additional history obtained from: Nursing notes from this visit.  I ordered, reviewed and interpreted labs which include: CBC, BMP.  Stable elevated creatinine of 2.06.  Hyperglycemia of 170.  Hyponatremia of 133 and hypokalemia of 3.4.  Leukocytosis of 15.1 with a baseline of approximately 10.5.  I ordered imaging studies including DVT study, x-ray right foot I independently visualized and interpreted imaging which showed x-ray pending at the time of shift change, DVT study negative for DVT but does show lymphadenopathy and subcutaneous edema  Febrile and tachycardic, otherwise hemodynamically stable.  52 year old male presenting for evaluation of right lower extremity pain.  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  He reports noticing a small ulcer to the second digit approximately 2 weeks ago.  He follows with Dr. Lajoyce Corners for this.  He is not on antibiotics.  He reports the pain has continuously gotten worse.  He has developed some chills as well.  On exam, there is no erythema or overlying skin changes of the lower extremity where most of his pain is, however he does have an ulcerated lesion to the plantar surface of the second digit.  He has a  leukocytosis of 15.1 as well as a fever.  Lactate and cultures were ordered.  DVT study negative.  Unclear source of infection at this time, however diabetic foot wound is high on the differential given his ultrasound which has enlarged lymph nodes and subcutaneous edema.  Care will be handed off to oncoming provider pending workup.  Patient will likely need admission and IV antibiotics, however will need further workup to determine source of infection.  Plan may change at the discretion of the oncoming provider.  Note: Portions of this report may have been transcribed using voice recognition software. Every effort was made to ensure accuracy; however, inadvertent computerized transcription errors may still be present.        Final Clinical Impression(s) / ED Diagnoses Final diagnoses:  None    Rx / DC Orders ED Discharge Orders     None         Michelle Piper, PA-C 01/21/23 1541    Coral Spikes, DO 01/21/23 1603

## 2023-01-22 ENCOUNTER — Inpatient Hospital Stay (HOSPITAL_COMMUNITY): Payer: 59

## 2023-01-22 ENCOUNTER — Other Ambulatory Visit: Payer: Self-pay

## 2023-01-22 ENCOUNTER — Encounter (HOSPITAL_COMMUNITY): Payer: 59

## 2023-01-22 ENCOUNTER — Encounter (HOSPITAL_COMMUNITY): Payer: Self-pay | Admitting: Internal Medicine

## 2023-01-22 DIAGNOSIS — L03115 Cellulitis of right lower limb: Secondary | ICD-10-CM

## 2023-01-22 DIAGNOSIS — N1832 Chronic kidney disease, stage 3b: Secondary | ICD-10-CM

## 2023-01-22 DIAGNOSIS — L97909 Non-pressure chronic ulcer of unspecified part of unspecified lower leg with unspecified severity: Secondary | ICD-10-CM

## 2023-01-22 DIAGNOSIS — E11622 Type 2 diabetes mellitus with other skin ulcer: Secondary | ICD-10-CM | POA: Diagnosis not present

## 2023-01-22 DIAGNOSIS — A419 Sepsis, unspecified organism: Secondary | ICD-10-CM | POA: Diagnosis not present

## 2023-01-22 LAB — GLUCOSE, CAPILLARY
Glucose-Capillary: 126 mg/dL — ABNORMAL HIGH (ref 70–99)
Glucose-Capillary: 150 mg/dL — ABNORMAL HIGH (ref 70–99)
Glucose-Capillary: 162 mg/dL — ABNORMAL HIGH (ref 70–99)
Glucose-Capillary: 165 mg/dL — ABNORMAL HIGH (ref 70–99)
Glucose-Capillary: 174 mg/dL — ABNORMAL HIGH (ref 70–99)

## 2023-01-22 LAB — BASIC METABOLIC PANEL
Anion gap: 12 (ref 5–15)
BUN: 20 mg/dL (ref 6–20)
CO2: 21 mmol/L — ABNORMAL LOW (ref 22–32)
Calcium: 8.1 mg/dL — ABNORMAL LOW (ref 8.9–10.3)
Chloride: 100 mmol/L (ref 98–111)
Creatinine, Ser: 2.38 mg/dL — ABNORMAL HIGH (ref 0.61–1.24)
GFR, Estimated: 32 mL/min — ABNORMAL LOW (ref >60)
Glucose, Bld: 181 mg/dL — ABNORMAL HIGH (ref 70–99)
Potassium: 3.4 mmol/L — ABNORMAL LOW (ref 3.5–5.1)
Sodium: 133 mmol/L — ABNORMAL LOW (ref 135–145)

## 2023-01-22 LAB — CBC
HCT: 40 % (ref 39.0–52.0)
Hemoglobin: 12.7 g/dL — ABNORMAL LOW (ref 13.0–17.0)
MCH: 25.2 pg — ABNORMAL LOW (ref 26.0–34.0)
MCHC: 31.8 g/dL (ref 30.0–36.0)
MCV: 79.5 fL — ABNORMAL LOW (ref 80.0–100.0)
Platelets: 212 10*3/uL (ref 150–400)
RBC: 5.03 MIL/uL (ref 4.22–5.81)
RDW: 15.9 % — ABNORMAL HIGH (ref 11.5–15.5)
WBC: 20.6 10*3/uL — ABNORMAL HIGH (ref 4.0–10.5)
nRBC: 0 % (ref 0.0–0.2)

## 2023-01-22 LAB — URINALYSIS, ROUTINE W REFLEX MICROSCOPIC
Bilirubin Urine: NEGATIVE
Glucose, UA: >=500 mg/dL — AB
Ketones, ur: 5 mg/dL — AB
Leukocytes,Ua: NEGATIVE
Nitrite: NEGATIVE
Protein, ur: >=300 mg/dL — AB
Specific Gravity, Urine: 1.028 (ref 1.005–1.030)
pH: 5 (ref 5.0–8.0)

## 2023-01-22 LAB — HEPATIC FUNCTION PANEL
ALT: 51 U/L — ABNORMAL HIGH (ref 0–44)
AST: 54 U/L — ABNORMAL HIGH (ref 15–41)
Albumin: 2.7 g/dL — ABNORMAL LOW (ref 3.5–5.0)
Alkaline Phosphatase: 103 U/L (ref 38–126)
Bilirubin, Direct: 0.3 mg/dL — ABNORMAL HIGH (ref 0.0–0.2)
Indirect Bilirubin: 0.4 mg/dL (ref 0.3–0.9)
Total Bilirubin: 0.7 mg/dL (ref 0.3–1.2)
Total Protein: 6.6 g/dL (ref 6.5–8.1)

## 2023-01-22 LAB — BRAIN NATRIURETIC PEPTIDE: B Natriuretic Peptide: 828.8 pg/mL — ABNORMAL HIGH (ref 0.0–100.0)

## 2023-01-22 LAB — HIV ANTIBODY (ROUTINE TESTING W REFLEX): HIV Screen 4th Generation wRfx: NONREACTIVE

## 2023-01-22 LAB — PHOSPHORUS: Phosphorus: 2.9 mg/dL (ref 2.5–4.6)

## 2023-01-22 LAB — MAGNESIUM: Magnesium: 2.1 mg/dL (ref 1.7–2.4)

## 2023-01-22 LAB — C-REACTIVE PROTEIN: CRP: 26.4 mg/dL — ABNORMAL HIGH (ref <1.0)

## 2023-01-22 LAB — SEDIMENTATION RATE: Sed Rate: 30 mm/h — ABNORMAL HIGH (ref 0–16)

## 2023-01-22 MED ORDER — ORAL CARE MOUTH RINSE: 15.0000 mL | OROMUCOSAL | Status: DC | PRN

## 2023-01-22 MED ORDER — ISOSORB DINITRATE-HYDRALAZINE 20-37.5 MG PO TABS
2.0000 | ORAL_TABLET | Freq: Three times a day (TID) | ORAL | Status: DC
  Administered 2023-01-22 – 2023-01-29 (×21): 2 via ORAL
  Filled 2023-01-22 (×22): qty 2

## 2023-01-22 MED ORDER — POTASSIUM CHLORIDE CRYS ER 20 MEQ PO TBCR
20.0000 meq | EXTENDED_RELEASE_TABLET | Freq: Once | ORAL | Status: AC
  Administered 2023-01-22: 20 meq via ORAL
  Filled 2023-01-22: qty 1

## 2023-01-22 MED ORDER — OXYCODONE HCL 5 MG PO TABS
5.0000 mg | ORAL_TABLET | ORAL | Status: DC | PRN
  Administered 2023-01-22 (×3): 5 mg via ORAL
  Filled 2023-01-22 (×3): qty 1

## 2023-01-22 MED ORDER — VANCOMYCIN HCL IN DEXTROSE 1-5 GM/200ML-% IV SOLN
1000.0000 mg | Freq: Two times a day (BID) | INTRAVENOUS | Status: DC
  Administered 2023-01-22 – 2023-01-23 (×4): 1000 mg via INTRAVENOUS
  Filled 2023-01-22 (×4): qty 200

## 2023-01-22 MED ORDER — APIXABAN 5 MG PO TABS
5.0000 mg | ORAL_TABLET | Freq: Two times a day (BID) | ORAL | Status: DC
  Administered 2023-01-22 – 2023-01-29 (×16): 5 mg via ORAL
  Filled 2023-01-22 (×16): qty 1

## 2023-01-22 MED ORDER — OXYCODONE HCL 5 MG PO TABS
5.0000 mg | ORAL_TABLET | ORAL | Status: DC | PRN
  Administered 2023-01-22 – 2023-01-28 (×13): 10 mg via ORAL
  Filled 2023-01-22 (×13): qty 2

## 2023-01-22 MED ORDER — METRONIDAZOLE 500 MG/100ML IV SOLN
500.0000 mg | Freq: Two times a day (BID) | INTRAVENOUS | Status: DC
  Administered 2023-01-22 – 2023-01-26 (×8): 500 mg via INTRAVENOUS
  Filled 2023-01-22 (×8): qty 100

## 2023-01-22 MED ORDER — METOPROLOL SUCCINATE ER 100 MG PO TB24
100.0000 mg | ORAL_TABLET | Freq: Every day | ORAL | Status: DC
  Administered 2023-01-22 – 2023-01-25 (×5): 100 mg via ORAL
  Filled 2023-01-22 (×5): qty 1

## 2023-01-22 MED ORDER — HYDROMORPHONE HCL 1 MG/ML IJ SOLN
0.5000 mg | INTRAMUSCULAR | Status: DC | PRN
  Administered 2023-01-22 – 2023-01-27 (×5): 0.5 mg via INTRAVENOUS
  Filled 2023-01-22 (×7): qty 1

## 2023-01-22 MED ORDER — ATORVASTATIN CALCIUM 10 MG PO TABS
20.0000 mg | ORAL_TABLET | Freq: Every day | ORAL | Status: DC
  Administered 2023-01-22 – 2023-01-28 (×7): 20 mg via ORAL
  Filled 2023-01-22 (×8): qty 2

## 2023-01-22 MED ORDER — SODIUM CHLORIDE 0.9 % IV SOLN: INTRAVENOUS | Status: AC

## 2023-01-22 NOTE — Consult Note (Addendum)
WOC Nurse Consult Note: patient had a R foot debridement with graft placement by Dr. Lajoyce Corners 09/2022; has been followed in their office and last orders for silver to wounds  Reason for Consult: Right 2nd toe  Wound type: full thickness R 2nd toe, likely diabetic/neuropathic  Pressure Injury POA: NA  Measurement: 0.8 cm x 0.8 cm  Wound bed: 100% dry eschar  Drainage (amount, consistency, odor)  none  Periwound: toe is edematous but patient states this is the usual amount, no warmth or induration noted  Dressing procedure/placement/frequency: Clean R 2nd toe ulcer with NS, apply a small piece of silver Hydrofiber [place] [id number]) to wound bed daily, cover with dry gauze and wrap with Kerlix to secure.   POC discussed with patient and bedside nurse. WOC team will not follow at this time. Patient should continue to follow-up with Dr. Burna Sis office at discharge.    Thank you,    Priscella Mann MSN, RN-BC, [employer] [phone]

## 2023-01-22 NOTE — Progress Notes (Signed)
New Admission Note:  Arrival Method: Stretcher Mental Orientation: Alert and oriented x 4 Telemetry: Box 1 Assessment: Completed Skin: Warm and dry. IV: NSL Pain: 6/10 Rt leg Tubes: N/A Safety Measures: Safety Fall Prevention Plan initiated.  Admission: Completed 5 M  Orientation: Patient has been orientated to the room, unit and the staff. Welcome booklet given.  Family: None   Orders have been reviewed and implemented. Will continue to monitor the patient. Call light has been placed within reach and bed alarm has been activated.   Guilford Shi BSN, RN  Phone Number: [phone]

## 2023-01-22 NOTE — Consult Note (Signed)
ORTHOPAEDIC CONSULTATION  REQUESTING PHYSICIAN: Osvaldo Shipper, MD  Chief Complaint: Cellulitis and fever.  HPI: Randy [name] is a 52 y.o. male who presents with fever with temperature up to 103 with redness and warmth of the right lower extremity.  Patient denies any trauma denies any open ulcers.  Past Medical History:  Diagnosis Date   CKD (chronic kidney disease) stage 3, GFR 30-59 ml/min (HCC)    baseline Cr 1.5-1.7   Decreased visual acuity    Left eye, resolved - hypertensive retinopathy   Diabetes mellitus 2012   Type II   Hilar adenopathy    on CT scan 02/2010, on rpt scan stable/improved.   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   Systolic murmur    Vitamin D deficiency    Past Surgical History:  Procedure Laterality Date   AMPUTATION Right 09/17/2021   Procedure: First ray amputation;  Surgeon: Nadara Mustard, MD;  Location: [facility] OR;  Service: Orthopedics;  Laterality: Right;   APPLICATION OF WOUND VAC Right 10/10/2021   Procedure: APPLICATION OF WOUND VAC;  Surgeon: Nadara Mustard, MD;  Location: [facility] OR;  Service: Orthopedics;  Laterality: Right;   ATRIAL FIBRILLATION ABLATION N/A 10/02/2022   Procedure: ATRIAL FIBRILLATION ABLATION;  Surgeon: Regan Lemming, MD;  Location: [facility] INVASIVE CV LAB;  Service: Cardiovascular;  Laterality: N/A;   CARDIOVERSION N/A 01/31/2021   Procedure: CARDIOVERSION;  Surgeon: Dolores Patty, MD;  Location: [facility] ENDOSCOPY;  Service: Cardiovascular;  Laterality: N/A;   CARDIOVERSION N/A 07/04/2021   Procedure: CARDIOVERSION;  Surgeon: Dolores Patty, MD;  Location: [facility] ENDOSCOPY;  Service: Cardiovascular;  Laterality: N/A;    hospitalization  11/2008   malignant HTN, nl SPEP/UPEP, neg ANCA panel, nl C3/4, neg anti GBM Ab, neg Hep A/B/C, nl renal US, nl PTH, neg HIV   I & D EXTREMITY Right 10/10/2021   Procedure: RIGHT FOOT DEBRIDEMENT;  Surgeon: Nadara Mustard, MD;  Location: [facility] OR;  Service: Orthopedics;  Laterality: Right;   RIGHT/LEFT HEART CATH AND CORONARY ANGIOGRAPHY N/A 01/29/2021   Procedure: RIGHT/LEFT HEART CATH AND CORONARY ANGIOGRAPHY;  Surgeon: Dolores Patty, MD;  Location: [facility] INVASIVE CV LAB;  Service: Cardiovascular;  Laterality: N/A;   TEE WITHOUT CARDIOVERSION N/A 01/31/2021   Procedure: TRANSESOPHAGEAL ECHOCARDIOGRAM (TEE);  Surgeon: Dolores Patty, MD;  Location: [facility] ENDOSCOPY;  Service: Cardiovascular;  Laterality: N/A;   TEE WITHOUT CARDIOVERSION N/A 10/02/2022   Procedure: TRANSESOPHAGEAL ECHOCARDIOGRAM;  Surgeon: Regan Lemming, MD;  Location: [facility] INVASIVE CV LAB;  Service: Cardiovascular;  Laterality: N/A;   US ECHOCARDIOGRAPHY  11/2008   LVsys fxn EF 50%, mild MR, normal LV size, neg ANA, neg cryoglobulins   US ECHOCARDIOGRAPHY  2011   severe LVH, EF 40%, LA mildly dilated, PA pressure moderately increased   Social History   Socioeconomic History   Marital status: Married    Spouse name: Noel Thormahlen   Number of children: 3   Years of education: Not on file   Highest education level: Bachelor's degree (e.g., BA, AB, BS)  Occupational History   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    Comment: [place]  Tobacco Use   Smoking status: Former    Types: Cigars   Smokeless tobacco: Never   Tobacco comments:    Former smoker 01/01/23  Vaping Use   Vaping status: Never Used  Substance and Sexual Activity   Alcohol use: Not Currently    Comment: stop drinking 01/01/23   Drug use: No   Sexual activity: Not on file  Other Topics Concern   Not on file  Social History Narrative   Newly married, 2012, 1 daughter, 1 son   No injectable steroid cycles   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,  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)   Regular exercise-yes   Social Determinants of Health   Financial Resource Strain: Low Risk  (01/27/2021)   Overall Financial Resource Strain (CARDIA)    Difficulty of Paying Living Expenses: Not hard at all  Food Insecurity: No Food Insecurity (01/22/2023)   Hunger Vital Sign    Worried About Running Out of Food in the Last Year: Never true    Ran Out of Food in the Last Year: Never true  Transportation Needs: No Transportation Needs (01/22/2023)   PRAPARE - Administrator, Civil Service (Medical): No    Lack of Transportation (Non-Medical): No  Physical Activity: Not on file  Stress: Not on file  Social Connections: Unknown (09/06/2021)   Received from [facility]   Social Network    Social Network: Not on file   Family History  Problem Relation Age of Onset   Hypertension Mother    Hypertension Father    Diabetes Father    Diabetes Sister    Hypertension Brother    Kidney disease Paternal Grandmother        ESRD   Coronary artery disease Neg Hx    Stroke Neg Hx    Cancer Neg Hx    - negative except otherwise stated in the family history section Allergies  Allergen Reactions   Fish-Derived Products Anaphylaxis   Prior to Admission medications   Medication Sig Start Date End Date Taking? Authorizing Provider  apixaban (ELIQUIS) 5 MG TABS tablet Take 1 tablet (5 mg total) by mouth 2 (two) times daily. 03/02/22   Clegg, Amy D, NP  atorvastatin (LIPITOR) 20 MG tablet Take 1 tablet (20 mg total) by mouth at bedtime. 01/15/22   Bensimhon, Bevelyn Buckles, MD  Continuous Blood Gluc Sensor (FREESTYLE LIBRE 3 SENSOR) MISC 1 each by Does not apply route every 14 (fourteen) days. 05/06/22   Carlus Pavlov, MD  ENTRESTO 97-103 MG Take 1 tablet by mouth twice daily 01/21/22   Clegg, Amy D, NP  furosemide (LASIX) 20 MG tablet Take 2 tablets (40 mg total) by mouth daily. 04/22/22   Bensimhon, Bevelyn Buckles, MD   isosorbide-hydrALAZINE (BIDIL) 20-37.5 MG tablet Take 2 tablets by mouth 3 (three) times daily. 12/22/22   Camnitz, Will Daphine Deutscher, MD  JARDIANCE 10 MG TABS tablet Take 1 tablet by mouth once daily 11/13/22   Bensimhon, Bevelyn Buckles, MD  metoprolol succinate (TOPROL-XL) 100 MG 24 hr tablet Take 1 tablet (100 mg total) by mouth at bedtime. Take with or immediately following a meal. 12/24/22   Tillery, Mariam Dollar, PA-C  tirzepatide [facility]) 12.5 MG/0.5ML Pen Inject 12.5 mg into the skin once a week. 11/05/22   Carlus Pavlov, MD   MR ANKLE RIGHT WO CONTRAST  Result Date: 01/22/2023 CLINICAL DATA:  Soft tissue infection EXAM: MRI OF THE RIGHT ANKLE WITHOUT CONTRAST TECHNIQUE: Multiplanar,  multisequence MR imaging of the ankle was performed. No intravenous contrast was administered. COMPARISON:  09/28/2011 radiographs FINDINGS: TENDONS Peroneal: Unremarkable Posteromedial: Unremarkable Anterior: Thickened tibialis anterior tendon compatible with tendinopathy. Achilles: Unremarkable Plantar Fascia: Plantar fascia is somewhat lax but otherwise unremarkable. LIGAMENTS Lateral: Unremarkable Medial: Unremarkable CARTILAGE Ankle Joint: Unremarkable Subtalar Joints/Sinus Tarsi: Mild degenerative spurring. Bones: Metatarsals and Lisfranc joint discussed on dedicated foot MRI. Prior first ray amputation. Mild degenerative findings at the articulation between the navicular and the middle and lateral cuneiforms. No fracture or osteomyelitis involving the hindfoot or midfoot. Other: Circumferential subcutaneous edema along the ankle and tracking in the dorsum of the foot. Low-grade regional muscular edema is likely neurogenic. IMPRESSION: 1. No osteomyelitis involving the hindfoot or midfoot. 2. Circumferential subcutaneous edema along the ankle and tracking in the dorsum of the foot. 3. Low-grade regional muscular edema is likely neurogenic. 4. Thickened tibialis anterior tendon compatible with tendinopathy. 5. Mild  degenerative findings at the articulation between the navicular and the middle and lateral cuneiforms. 6. Prior first ray amputation. Electronically Signed   By: Gaylyn Rong M.D.   On: 01/22/2023 08:53   MR FOOT RIGHT WO CONTRAST  Result Date: 01/22/2023 CLINICAL DATA:  Soft tissue infection EXAM: MRI OF THE RIGHT FOREFOOT WITHOUT CONTRAST TECHNIQUE: Multiplanar, multisequence MR imaging of the right forefoot was performed. No intravenous contrast was administered. COMPARISON:  Radiographs 01/21/2023 FINDINGS: Bones/Joint/Cartilage Prior amputation of the first ray. Deformities of the third and fourth metatarsals due to callus and old fracture. 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 Edema along the plantar head of the fourth metatarsal likely from arthropathy and partial chronic nonunion. There is edema signal in the distal phalanx of the second toe as on image 26 of series 14 and image 20 series 12, osteomyelitis not excluded. Nonunited fracture of the proximal metaphysis of the second metatarsal noted, with extensive plantar callus formation along the distal fragment, and 9 mm medial displacement of the distal fragment as shown on image 15 of series 11. Along the nonunion there are erosive findings and heterogeneous but mainly intermediate T1 signal characteristics measuring about 2.7 by 2.0 cm, with erosive properties along the proximal margin of the distal fragment as on image 14 of series 11, and edema in the proximal shaft of the second metatarsal. There is also edema in the remaining base of the second metatarsal. The possibility of chronic osteomyelitis along this nonunion site is raised. Erosive sterile granulomatous response is also a possibility. There is some patchy edema in the proximal third metatarsal which is probably from arthropathy. Small amount of dorsal edema along the lateral cuneiform as well. Ligaments The Lisfranc ligament is intact and attaches  to the edematous base fragment of the second metatarsal. Muscles and Tendons Tendons associated with the first digit or blind-ending as expected in the setting of amputation. Moderate muscular atrophy and low-level generalized muscular edema which is probably neurogenic. Soft tissues Abnormal band of intermediate T1 signal in the soft tissues along the medial foot along the resected first ray measuring about 10.7 by 2.8 by 5.4 cm, with heterogeneous T2 signal, probably representing granulation tissue. This extends over towards the second metatarsal base as on image 17 of series 11 Dorsal subcutaneous edema in the forefoot extends into the second and third toes. No well-defined drainable abscess. IMPRESSION: 1. Nonunited fracture of the proximal metaphysis of the second metatarsal, with extensive plantar callus formation along the distal fragment, and 9 mm medial displacement  of the distal fragment. Along the nonunion site there is heterogeneous but mainly intermediate T1 signal characteristics measuring about 2.7 by 2.0 cm, with erosive findings along the proximal margin of the distal fragment, and edema in the proximal shaft of the second metatarsal. The possibility of chronic osteomyelitis along this nonunion site is raised. Erosive sterile granulomatous response is also a possibility. 2. Extensive adjacent intermediate T1 signal in the soft tissues along the resected first ray, probably granulomatous tissue. 3. Edema in the distal phalanx of the second toe, osteomyelitis is a distinct possibility. 4. Abnormal band of intermediate T1 signal in the soft tissues along the medial foot along the resected first ray measuring about 10.7 by 2.8 by 5.4 cm, probably representing granulation tissue. 5. Dorsal subcutaneous edema in the forefoot extends into the second and third toes. 6. Moderate muscular atrophy and low-level generalized muscular edema which is probably neurogenic. 7. Deformities of the third and fourth  metatarsals due to callus and old fracture. 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 Electronically Signed   By: Gaylyn Rong M.D.   On: 01/22/2023 08:49   DG CHEST PORT 1 VIEW  Result Date: 01/22/2023 CLINICAL DATA:  Sepsis, leg pain. EXAM: PORTABLE CHEST 1 VIEW COMPARISON:  December 24, 2022 FINDINGS: There is stable mild to moderate severity cardiac silhouette enlargement. Mild prominence of the pulmonary vasculature is seen. Low lung volumes are noted. There is no evidence of focal consolidation, pleural effusion or pneumothorax. The visualized skeletal structures are unremarkable. IMPRESSION: Stable cardiomegaly with mild pulmonary vascular congestion. Electronically Signed   By: Aram Candela M.D.   On: 01/22/2023 01:53   DG Foot Complete Right  Result Date: 01/21/2023 CLINICAL DATA:  2nd toe ulcer EXAM: RIGHT FOOT COMPLETE - 3+ VIEW COMPARISON:  09/14/2021. FINDINGS: Bone mineralization within normal limits for patient's age. Since the prior study, there is amputation of great toe. There is malunited fracture deformity of the second through fourth metatarsal. There are marked asymmetric degenerative changes of the second tarsometatarsal joint. No significant arthritis of other imaged joints. Note is made of pes planus deformity. No acute fracture or dislocation. No aggressive osseous lesion. No focal erosion noted along the tip of second toe. No evidence of air within the soft tissue. Ankle mortise appears intact. No radiopaque foreign bodies. IMPRESSION: *No radiographic evidence of osteomyelitis. If there is continued clinical concern, MRI is recommended. *Multiple other nonacute observations, as described above. Electronically Signed   By: Jules Schick M.D.   On: 01/21/2023 17:56   VAS [place] LOWER EXTREMITY VENOUS (DVT) (7a-7p)  Result Date: 01/21/2023  Lower Venous DVT Study Patient Name:  AGIM KINGCADE  Date of Exam:   01/21/2023 Medical Rec #:  [id number]       Accession #:    [id number] Date of Birth: [birth year]       Patient Gender: M Patient Age:   65 years Exam Location:  [facility] Procedure:      VAS [place] LOWER EXTREMITY VENOUS (DVT) Referring Phys: Lyn Hollingshead SCHUTT --------------------------------------------------------------------------------  Indications: Pain.  Anticoagulation: Eliquis (afib). Limitations: Poor ultrasound/tissue interface. Comparison Study: Previous exam on 09/14/2021 was negative for DVT. Performing Technologist: Ernestene Mention RVT, RDMS  Examination Guidelines: A complete evaluation includes B-mode imaging, spectral Doppler, color Doppler, and power Doppler as needed of all accessible portions of each vessel. Bilateral testing is considered an integral part of a complete examination. Limited examinations for reoccurring indications may be performed as noted. The  reflux portion of the exam is performed with the patient in reverse Trendelenburg.  +---------+---------------+---------+-----------+----------+--------------+ RIGHT    CompressibilityPhasicitySpontaneityPropertiesThrombus Aging +---------+---------------+---------+-----------+----------+--------------+ CFV      Full           Yes      Yes                                 +---------+---------------+---------+-----------+----------+--------------+ SFJ      Full                                                        +---------+---------------+---------+-----------+----------+--------------+ FV Prox  Full           Yes      Yes                                 +---------+---------------+---------+-----------+----------+--------------+ FV Mid   Full           Yes      Yes                                 +---------+---------------+---------+-----------+----------+--------------+ FV DistalFull           Yes      Yes                                 +---------+---------------+---------+-----------+----------+--------------+ PFV       Full                                                        +---------+---------------+---------+-----------+----------+--------------+ POP      Full           Yes      Yes                                 +---------+---------------+---------+-----------+----------+--------------+ PTV      Full                                                        +---------+---------------+---------+-----------+----------+--------------+ PERO     Full                                                        +---------+---------------+---------+-----------+----------+--------------+   +----+---------------+---------+-----------+----------+--------------+ LEFTCompressibilityPhasicitySpontaneityPropertiesThrombus Aging +----+---------------+---------+-----------+----------+--------------+ CFV Full           Yes      Yes                                 +----+---------------+---------+-----------+----------+--------------+  Summary: RIGHT: - There is no evidence of deep vein thrombosis in the lower extremity.  - No cystic structure found in the popliteal fossa. - Ultrasound characteristics of enlarged lymph nodes are noted in the groin and popliteal fossa. - Subcutaneous fluid noted in area of calf and ankle.  LEFT: - No evidence of common femoral vein obstruction.   *See table(s) above for measurements and observations. Electronically signed by Heath Lark on 01/21/2023 at 5:09:29 PM.    Final    - pertinent xrays, CT, MRI studies were reviewed and independently interpreted  Positive ROS: All other systems have been reviewed and were otherwise negative with the exception of those mentioned in the HPI and as above.  Physical Exam: General: Alert, no acute distress Psychiatric: Patient is competent for consent with normal mood and affect Lymphatic: No axillary or cervical lymphadenopathy Cardiovascular: No pedal edema Respiratory: No cyanosis, no use of accessory musculature GI: No  organomegaly, abdomen is soft and non-tender    Images:  @ENCIMAGES @  Labs:  Lab Results  Component Value Date   HGBA1C 5.8 (A) 12/22/2022   HGBA1C 6.4 (A) 08/12/2022   HGBA1C 5.9 (A) 05/06/2022   ESRSEDRATE 30 (H) 01/22/2023   ESRSEDRATE 3 08/07/2020   CRP 26.4 (H) 01/22/2023   REPTSTATUS PENDING 01/21/2023   CULT  01/21/2023    NO GROWTH < 24 HOURS Performed at [facility], [address], [place], [place] [address]     Lab Results  Component Value Date   ALBUMIN 2.7 (L) 01/22/2023   ALBUMIN 3.8 12/22/2022   ALBUMIN 3.4 (L) 04/22/2022        Latest Ref Rng & Units 01/22/2023   12:53 AM 01/21/2023    1:23 PM 12/24/2022   10:27 AM  CBC EXTENDED  WBC 4.0 - 10.5 K/uL 20.6  15.1  10.6   RBC 4.22 - 5.81 MIL/uL 5.03  5.23  6.20   Hemoglobin 13.0 - 17.0 g/dL 40.1  02.7  25.3   HCT 39.0 - 52.0 % 40.0  42.4  50.4   Platelets 150 - 400 K/uL 212  216  258   NEUT# 1.7 - 7.7 K/uL  12.9    Lymph# 0.7 - 4.0 K/uL  0.8      Neurologic: Patient does not have protective sensation bilateral lower extremities.   MUSCULOSKELETAL:   Skin: Examination patient has venous and lymphatic swelling of both lower extremities.  The left lower extremity is cool to the touch nontender to palpation.  Examination of right lower extremity it is warm to the touch there is no crepitation there is no induration no fluctuant abscess.  Review of the MRI scan of the right foot does not show any abscess does not show any definitive osteomyelitis.  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  Clinically there is no definite infection in the foot.  Review of the MRI scan of the ankle shows no definite osteomyelitis there is swelling no abscess.  Patient does have a CT scan of the right femur that is ordered as well as MRI of the sacrum and lumbar spine.  Assessment: Assessment: Cellulitis right lower extremity without definite signs of abscess or  osteomyelitis.  Plan: Plan: Would continue the IV antibiotics for the right lower extremity.  I agree that there most likely is a separate source of this infection.  Thank you for the consult and the opportunity to see Mr. Schaner  Aldean Baker, MD [facility] [phone] 4:18 PM

## 2023-01-22 NOTE — Progress Notes (Signed)
Pt's HR and BP was up. MD was informed and restarted his BP medication. Yellow MEWS protocol was implemented.  01/22/23 1550  Assess: MEWS Score  Temp (!) 100.7 F (38.2 C)  BP (!) 140/100  MAP (mmHg) 114  Pulse Rate (!) 111  Resp 18  SpO2 96 %  O2 Device Room Air  Assess: MEWS Score  MEWS Temp 1  MEWS Systolic 0  MEWS Pulse 2  MEWS RR 0  MEWS LOC 0  MEWS Score 3  MEWS Score Color Yellow  Assess: if the MEWS score is Yellow or Red  Were vital signs accurate and taken at a resting state? Yes  Does the patient meet 2 or more of the SIRS criteria? No  MEWS guidelines implemented  Yes, yellow  Treat  MEWS Interventions Considered administering scheduled or prn medications/treatments as ordered  Take Vital Signs  Increase Vital Sign Frequency  Yellow: Q2hr x1, continue Q4hrs until patient remains green for 12hrs  Escalate  MEWS: Escalate Yellow: Discuss with charge nurse and consider notifying provider and/or RRT  Notify: Charge Nurse/RN  Name of Charge Nurse/RN Notified [facility] CN  Provider Notification  Provider Name/Title Osvaldo Shipper  Date Provider Notified 01/22/23  Time Provider Notified 1606  Method of Notification Page (secured chat)  Notification Reason Other (Comment) (Yellow MEWS)  Provider response Other (Comment) (awaiting for response)  Assess: SIRS CRITERIA  SIRS Temperature  0  SIRS Pulse 1  SIRS Respirations  0  SIRS WBC 0  SIRS Score Sum  1

## 2023-01-22 NOTE — Plan of Care (Signed)
  Problem: Education: Goal: Understanding of disease, treatment, and recovery process will improve Outcome: Progressing   Problem: Activity: Goal: Ability to return to baseline activity level will improve Outcome: Progressing   Problem: Cardiac: Goal: Ability to maintain adequate cardiovascular perfusion will improve Outcome: Progressing Goal: Vascular access site(s) Level 0-1 will be maintained Outcome: Progressing   Problem: Health Behavior/ Discharge Planning: Goal: Ability to safely manage health related needs after discharge Outcome: Progressing   Problem: Education: Goal: Ability to describe self-care measures that may prevent or decrease complications (Diabetes Survival Skills Education) will improve Outcome: Progressing Goal: Individualized Educational Video(s) Outcome: Progressing   Problem: Coping: Goal: Ability to adjust to condition or change in health will improve Outcome: Progressing   Problem: Fluid Volume: Goal: Ability to maintain a balanced intake and output will improve Outcome: Progressing   Problem: Health Behavior/Discharge Planning: Goal: Ability to identify and utilize available resources and services will improve Outcome: Progressing Goal: Ability to manage health-related needs will improve Outcome: Progressing   Problem: Metabolic: Goal: Ability to maintain appropriate glucose levels will improve Outcome: Progressing   Problem: Nutritional: Goal: Maintenance of adequate nutrition will improve Outcome: Progressing Goal: Progress toward achieving an optimal weight will improve Outcome: Progressing   Problem: Skin Integrity: Goal: Risk for impaired skin integrity will decrease Outcome: Progressing   Problem: Tissue Perfusion: Goal: Adequacy of tissue perfusion will improve Outcome: Progressing   Problem: Education: Goal: Knowledge of General Education information will improve Description: Including pain rating scale, medication(s)/side  effects and non-pharmacologic comfort measures Outcome: Progressing   Problem: Health Behavior/Discharge Planning: Goal: Ability to manage health-related needs will improve Outcome: Progressing   Problem: Clinical Measurements: Goal: Ability to maintain clinical measurements within normal limits will improve Outcome: Progressing Goal: Will remain free from infection Outcome: Progressing Goal: Diagnostic test results will improve Outcome: Progressing Goal: Respiratory complications will improve Outcome: Progressing Goal: Cardiovascular complication will be avoided Outcome: Progressing   Problem: Activity: Goal: Risk for activity intolerance will decrease Outcome: Progressing   Problem: Nutrition: Goal: Adequate nutrition will be maintained Outcome: Progressing   Problem: Coping: Goal: Level of anxiety will decrease Outcome: Progressing   Problem: Elimination: Goal: Will not experience complications related to bowel motility Outcome: Progressing Goal: Will not experience complications related to urinary retention Outcome: Progressing   Problem: Pain Managment: Goal: General experience of comfort will improve Outcome: Progressing   Problem: Safety: Goal: Ability to remain free from injury will improve Outcome: Progressing   Problem: Skin Integrity: Goal: Risk for impaired skin integrity will decrease Outcome: Progressing   Problem: Fluid Volume: Goal: Hemodynamic stability will improve Outcome: Progressing   Problem: Clinical Measurements: Goal: Diagnostic test results will improve Outcome: Progressing Goal: Signs and symptoms of infection will decrease Outcome: Progressing   Problem: Respiratory: Goal: Ability to maintain adequate ventilation will improve Outcome: Progressing

## 2023-01-22 NOTE — Progress Notes (Signed)
   01/22/23 1927  Vitals  Temp (!) 101 F (38.3 C)  Temp Source Oral  BP (!) 131/90  MAP (mmHg) 102  BP Location Left Arm  BP Method Automatic  Patient Position (if appropriate) Lying  Pulse Rate (!) 129  Pulse Rate Source Dinamap  Resp (!) 22  Level of Consciousness  Level of Consciousness Alert  MEWS COLOR  MEWS Score Color Red  Oxygen Therapy  SpO2 94 %  O2 Device Room Air  Pain Assessment  Pain Scale 0-10  Pain Score 6  Pain Type Acute pain  Pain Location Ankle  Pain Orientation Right  Pain Descriptors / Indicators Throbbing  Pain Frequency Intermittent  Pain Onset On-going  Patients Stated Pain Goal 1  MEWS Score  MEWS Temp 1  MEWS Systolic 0  MEWS Pulse 2  MEWS RR 1  MEWS LOC 0  MEWS Score 4  Provider Notification  Provider Name/Title V. Rathore  Date Provider Notified 01/22/23  Time Provider Notified 1948  Method of Notification Page  Rapid Response Notification  Name of Rapid Response RN Notified Katelyn RN  Date Rapid Response Notified 01/22/23  Time Rapid Response Notified 1952   Tylenol and pain medication given. MD aware.

## 2023-01-22 NOTE — Progress Notes (Addendum)
[facility] PROGRESS NOTE   Randy [name] WGN:[id number] DOB: 1970/05/24 DOA: 01/21/2023  PCP: Pcp, No  Brief History: 52 y.o. 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  Denies any noticeable rash in the area.  Has chronic wound involving the right foot, and an ulcer on the right second toe.  Reports associated rigors.  No cough/cold symptoms, chest pain, abdominal pain, nausea, vomiting, diarrhea, dysuria.  No injury to the area of the right thigh.  No pain localizing to the knee.  No indwelling hardware.    Consultants: Will consult Dr. Lajoyce Corners today  Procedures: None yet    Subjective/Interval History: Complains of pain in the right groin area.  Denies any significant pain in the foot.  Denies any respiratory symptoms.  No nausea vomiting or diarrhea.  No dysuria.  No skin rashes.  No joint pains more than usual.    Assessment/Plan:  Sepsis likely source being right foot versus right lower extremity cellulitis/lactic acidosis Presented with fever leukocytosis tachycardia and tachypnea.  However no clear source of infection was initially identified.  Patient denies any respiratory symptoms nausea vomiting diarrhea or dysuria. His right lower extremity was noted to be swollen and warm to touch but no clear erythema was noted.  He does have lymphadenopathy in the right inguinal area.  Ill-defined density also noted in the right groin.  Right foot did not appear to be overtly infected based on clinical exam.  MRI was done which shows multiple findings raising concern for osteomyelitis. We will consult Dr. Lajoyce Corners who has seen previously. We will get CT scan of his thigh area. Doppler studies negative for DVT. CBC shows a significantly elevated  WBC. Lactic acid level has improved. Continue broad-spectrum antibiotics with vancomycin and cefepime.  Add metronidazole.  Chronic systolic CHF Last echocardiogram done earlier this month showed improvement in EF to 50 to 55%.  Furosemide is on hold.  His other goal-directed medical treatments are also on hold currently due to his acute issues.  Chronic kidney disease stage IIIb Baseline creatinine is near 2.  Monitor urine output.  Avoid nephrotoxic agents.  Hyponatremia and hypokalemia Will recheck labs.  Paroxysmal atrial fibrillation He is status post ablation.  He is on metoprolol and apixaban.  History of CVA Statin and anticoagulation being continued.  Diabetes mellitus type 2 in obese Holding Mounjaro or Jardiance.  SSI.  Morbid obesity Estimated body mass index is 42.31 kg/m as calculated from the following:   Height as of this encounter: 6\' 8"  (2.032 m).   Weight as of this encounter: 174.7 kg.  DVT Prophylaxis: On apixaban Code Status: Full code Family Communication: Discussed with patient Disposition Plan: Return home when improved  Status is: Inpatient Remains inpatient appropriate because: Sepsis, right lower extremity infection      Medications: Scheduled:  apixaban  5 mg Oral BID   atorvastatin  20 mg Oral QHS   insulin aspart  0-6 Units Subcutaneous TID WC   metoprolol succinate  100 mg Oral QHS   Continuous:  ceFEPime (MAXIPIME) IV 2 g (01/22/23 0426)   vancomycin 1,000 mg (01/22/23 0810)   HQI:[id number], mouth rinse, oxyCODONE  Antibiotics: Anti-infectives (From admission, onward)    Start     Dose/Rate Route Frequency Ordered Stop   01/22/23 0900  vancomycin (VANCOCIN) IVPB 1000 mg/200 mL premix        1,000 mg 200 mL/hr over 60 Minutes Intravenous Every 12 hours 01/22/23 0006     01/22/23 0500  ceFEPIme (MAXIPIME) 2 g in sodium chloride 0.9 % 100 mL IVPB        2 g 200 mL/hr over 30 Minutes Intravenous Every 8 hours 01/21/23 2345      01/21/23 2115  ceFEPIme (MAXIPIME) 2 g in sodium chloride 0.9 % 100 mL IVPB        2 g 200 mL/hr over 30 Minutes Intravenous  Once 01/21/23 2111 01/21/23 2140   01/21/23 2115  vancomycin (VANCOCIN) 2,500 mg in sodium chloride 0.9 % 500 mL IVPB        2,500 mg 262.5 mL/hr over 120 Minutes Intravenous  Once 01/21/23 2111 01/21/23 2352       Objective:  Vital Signs  Vitals:   01/22/23 0041 01/22/23 0428 01/22/23 0733 01/22/23 0743  BP:  (!) 125/91 (!) 146/92   Pulse:  (!) 110 94   Resp: 20 18 18    Temp:  99.1 F (37.3 C) (!) 102 F (38.9 C) 98.9 F (37.2 C)  TempSrc:  Oral Oral Oral  SpO2: 98% 98% 97%   Weight:      Height:        Intake/Output Summary (Last 24 hours) at 01/22/2023 0946 Last data filed at 01/22/2023 0600 Gross per 24 hour  Intake 500 ml  Output 900 ml  Net -400 ml   Filed Weights   01/21/23 1317 01/22/23 0020  Weight: (!) 170.3 kg (!) 174.7 kg    General appearance: Awake alert.  In no distress Resp: Clear to auscultation bilaterally.  Normal effort Cardio: S1-S2 is normal regular.  No S3-S4.  No rubs murmurs or bruit GI: Abdomen is soft.  Nontender nondistended.  Bowel sounds are present normal.  No masses organomegaly Extremities: Right lower remedy is noted to be swollen and warm to touch but no erythema is noted.  Enlarged lymph nodes in the right pulmonary area with fine density noted inferiorly to the groin as well near the medial thigh. Neurologic: Alert and oriented x3.  No focal neurological deficits.    Lab Results:  Data Reviewed: I have personally reviewed following labs and reports of the imaging studies  CBC: Recent Labs  Lab 01/21/23 1323 01/22/23 0053  WBC 15.1* 20.6*  NEUTROABS 12.9*  --   HGB 13.3 12.7*  HCT 42.4 40.0  MCV 81.1 79.5*  PLT 216 212    Basic Metabolic Panel: Recent Labs  Lab 01/21/23 1323  NA 133*  K 3.4*  CL 103  CO2 20*  GLUCOSE 170*  BUN 17  CREATININE 2.06*  CALCIUM 8.3*     GFR: Estimated Creatinine Clearance: 77.4 mL/min (A) (by C-G formula based on SCr of 2.06 mg/dL (H)).  CBG: Recent Labs  Lab 01/22/23 0031 01/22/23 0735  GLUCAP 165* 126*     Recent Results (from the past 240 hour(s))  Culture, blood (routine x 2)     Status: None (Preliminary result)   Collection Time: 01/21/23  2:12 PM   Specimen: BLOOD  Result Value Ref Range Status   Specimen Description BLOOD SITE NOT SPECIFIED  Final   Special Requests   Final    BOTTLES DRAWN AEROBIC AND ANAEROBIC Blood Culture adequate volume   Culture   Final    NO GROWTH < 24 HOURS Performed at [facility], [address] [place], [place] [address]    Report Status PENDING  Incomplete  Culture, blood (routine x 2)     Status: None (Preliminary result)   Collection Time: 01/21/23  2:17 PM   Specimen: BLOOD  Result Value Ref Range Status   Specimen Description BLOOD SITE NOT SPECIFIED  Final   Special Requests   Final    BOTTLES DRAWN AEROBIC ONLY Blood Culture adequate volume   Culture   Final    NO GROWTH < 24 HOURS Performed at [facility], [address], [place], [place] [address]    Report Status PENDING  Incomplete      Radiology Studies: MR ANKLE RIGHT WO CONTRAST  Result Date: 01/22/2023 CLINICAL DATA:  Soft tissue infection EXAM: MRI OF THE RIGHT ANKLE WITHOUT CONTRAST TECHNIQUE: Multiplanar, multisequence MR imaging of the ankle was performed. No intravenous contrast was administered. COMPARISON:  09/28/2011 radiographs FINDINGS: TENDONS Peroneal: Unremarkable Posteromedial: Unremarkable Anterior: Thickened tibialis anterior tendon compatible with tendinopathy. Achilles: Unremarkable Plantar Fascia: Plantar fascia is somewhat lax but otherwise unremarkable. LIGAMENTS Lateral: Unremarkable Medial: Unremarkable CARTILAGE Ankle Joint: Unremarkable Subtalar Joints/Sinus Tarsi: Mild degenerative spurring. Bones: Metatarsals and Lisfranc joint discussed on dedicated foot  MRI. Prior first ray amputation. Mild degenerative findings at the articulation between the navicular and the middle and lateral cuneiforms. No fracture or osteomyelitis involving the hindfoot or midfoot. Other: Circumferential subcutaneous edema along the ankle and tracking in the dorsum of the foot. Low-grade regional muscular edema is likely neurogenic. IMPRESSION: 1. No osteomyelitis involving the hindfoot or midfoot. 2. Circumferential subcutaneous edema along the ankle and tracking in the dorsum of the foot. 3. Low-grade regional muscular edema is likely neurogenic. 4. Thickened tibialis anterior tendon compatible with tendinopathy. 5. Mild degenerative findings at the articulation between the navicular and the middle and lateral cuneiforms. 6. Prior first ray amputation. Electronically Signed   By: Gaylyn Rong M.D.   On: 01/22/2023 08:53   MR FOOT RIGHT WO CONTRAST  Result Date: 01/22/2023 CLINICAL DATA:  Soft tissue infection EXAM: MRI OF THE RIGHT FOREFOOT WITHOUT CONTRAST TECHNIQUE: Multiplanar, multisequence MR imaging of the right forefoot was performed. No intravenous contrast was administered. COMPARISON:  Radiographs 01/21/2023 FINDINGS: Bones/Joint/Cartilage Prior amputation of the first ray. Deformities of the third and fourth metatarsals due to callus and old fracture. 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 Edema along the plantar head of the fourth metatarsal likely from arthropathy and partial chronic nonunion. There is edema signal in the distal phalanx of the second toe as on image 26 of series 14 and image 20 series 12, osteomyelitis not excluded. Nonunited fracture of the proximal metaphysis of the second metatarsal noted, with extensive plantar callus formation along the distal fragment, and 9 mm medial displacement of the distal fragment as shown on image 15 of series 11. Along the nonunion there are erosive findings and heterogeneous but mainly  intermediate T1 signal characteristics measuring about 2.7 by 2.0 cm, with erosive properties along the proximal margin of the distal fragment as on image 14 of series 11, and edema in the proximal shaft of the second metatarsal. There is also edema in the remaining base of the second metatarsal. The possibility of chronic osteomyelitis along this nonunion site is raised. Erosive sterile granulomatous response is also a possibility. There is some patchy edema in the proximal third metatarsal which is probably from arthropathy. Small amount of dorsal edema along the lateral cuneiform as well. Ligaments  The Lisfranc ligament is intact and attaches to the edematous base fragment of the second metatarsal. Muscles and Tendons Tendons associated with the first digit or blind-ending as expected in the setting of amputation. Moderate muscular atrophy and low-level generalized muscular edema which is probably neurogenic. Soft tissues Abnormal band of intermediate T1 signal in the soft tissues along the medial foot along the resected first ray measuring about 10.7 by 2.8 by 5.4 cm, with heterogeneous T2 signal, probably representing granulation tissue. This extends over towards the second metatarsal base as on image 17 of series 11 Dorsal subcutaneous edema in the forefoot extends into the second and third toes. No well-defined drainable abscess. IMPRESSION: 1. 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 Along the nonunion site there is heterogeneous but mainly intermediate T1 signal characteristics measuring about 2.7 by 2.0 cm, with erosive findings along the proximal margin of the distal fragment, and edema in the proximal shaft of the second metatarsal. The possibility of chronic osteomyelitis along this nonunion site is raised. Erosive sterile granulomatous response is also a  possibility. 2. Extensive adjacent intermediate T1 signal in the soft tissues along the resected first ray, probably granulomatous tissue. 3. Edema in the distal phalanx of the second toe, osteomyelitis is a distinct possibility. 4. Abnormal band of intermediate T1 signal in the soft tissues along the medial foot along the resected first ray measuring about 10.7 by 2.8 by 5.4 cm, probably representing granulation tissue. 5. Dorsal subcutaneous edema in the forefoot extends into the second and third toes. 6. Moderate muscular atrophy and low-level generalized muscular edema which is probably neurogenic. 7. Deformities of the third and fourth metatarsals due to callus and old fracture. 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 Electronically Signed   By: Gaylyn Rong M.D.   On: 01/22/2023 08:49   DG CHEST PORT 1 VIEW  Result Date: 01/22/2023 CLINICAL DATA:  Sepsis, leg pain. EXAM: PORTABLE CHEST 1 VIEW COMPARISON:  December 24, 2022 FINDINGS: There is stable mild to moderate severity cardiac silhouette enlargement. Mild prominence of the pulmonary vasculature is seen. Low lung volumes are noted. There is no evidence of focal consolidation, pleural effusion or pneumothorax. The visualized skeletal structures are unremarkable. IMPRESSION: Stable cardiomegaly with mild pulmonary vascular congestion. Electronically Signed   By: Aram Candela M.D.   On: 01/22/2023 01:53   DG Foot Complete Right  Result Date: 01/21/2023 CLINICAL DATA:  2nd toe ulcer EXAM: RIGHT FOOT COMPLETE - 3+ VIEW COMPARISON:  09/14/2021. FINDINGS: Bone mineralization within normal limits for patient's age. Since the prior study, there is amputation of great toe. There is malunited fracture deformity of the second through fourth metatarsal. There are marked asymmetric degenerative changes of the second tarsometatarsal joint. No significant arthritis of other imaged joints. Note is made of pes planus  deformity. No acute fracture or dislocation. No aggressive osseous lesion. No focal erosion noted along the tip of second toe. No evidence of air within the soft tissue. Ankle mortise appears intact. No radiopaque foreign bodies. IMPRESSION: *No radiographic evidence of osteomyelitis. If there is continued clinical concern, MRI is recommended. *Multiple other nonacute observations, as described above. Electronically Signed   By: Jules Schick M.D.   On: 01/21/2023 17:56   VAS [place] LOWER EXTREMITY VENOUS (DVT) (7a-7p)  Result Date: 01/21/2023  Lower Venous DVT Study Patient Name:  NIXEN AVALO  Date of Exam:   01/21/2023 Medical Rec #: [id number]       Accession #:    [id number] Date of Birth: [birth year]       Patient Gender: M Patient Age:   59 years Exam Location:  [facility] Procedure:      VAS [place] LOWER EXTREMITY VENOUS (DVT) Referring Phys: Lyn Hollingshead SCHUTT --------------------------------------------------------------------------------  Indications: Pain.  Anticoagulation: Eliquis (afib). Limitations: Poor ultrasound/tissue interface. Comparison Study: Previous exam on 09/14/2021 was negative for DVT. Performing Technologist: Ernestene Mention RVT, RDMS  Examination Guidelines: A complete evaluation includes B-mode imaging, spectral Doppler, color Doppler, and power Doppler as needed of all accessible portions of each vessel. Bilateral testing is considered an integral part of a complete examination. Limited examinations for reoccurring indications may be performed as noted. The reflux portion of the exam is performed with the patient in reverse Trendelenburg.  +---------+---------------+---------+-----------+----------+--------------+ RIGHT    CompressibilityPhasicitySpontaneityPropertiesThrombus Aging +---------+---------------+---------+-----------+----------+--------------+ CFV      Full           Yes      Yes                                  +---------+---------------+---------+-----------+----------+--------------+ SFJ      Full                                                        +---------+---------------+---------+-----------+----------+--------------+ FV Prox  Full           Yes      Yes                                 +---------+---------------+---------+-----------+----------+--------------+ FV Mid   Full           Yes      Yes                                 +---------+---------------+---------+-----------+----------+--------------+ FV DistalFull           Yes      Yes                                 +---------+---------------+---------+-----------+----------+--------------+ PFV      Full                                                        +---------+---------------+---------+-----------+----------+--------------+ POP      Full           Yes      Yes                                 +---------+---------------+---------+-----------+----------+--------------+ PTV      Full                                                        +---------+---------------+---------+-----------+----------+--------------+  PERO     Full                                                        +---------+---------------+---------+-----------+----------+--------------+   +----+---------------+---------+-----------+----------+--------------+ LEFTCompressibilityPhasicitySpontaneityPropertiesThrombus Aging +----+---------------+---------+-----------+----------+--------------+ CFV Full           Yes      Yes                                 +----+---------------+---------+-----------+----------+--------------+     Summary: RIGHT: - There is no evidence of deep vein thrombosis in the lower extremity.  - No cystic structure found in the popliteal fossa. - Ultrasound characteristics of enlarged lymph nodes are noted in the groin and popliteal fossa. - Subcutaneous fluid noted in area of calf and ankle.   LEFT: - No evidence of common femoral vein obstruction.   *See table(s) above for measurements and observations. Electronically signed by Heath Lark on 01/21/2023 at 5:09:29 PM.    Final        LOS: 1 day   Osvaldo Shipper  [facility] Pager on [email or web address]  01/22/2023, 9:46 AM

## 2023-01-22 NOTE — H&P (Addendum)
toe with healed scarring.  Right second toe with ulcer at the distal tip.  No other wounds on the feet. Skin: See MSK for more findings.  Otherwise no rashes or lesions to exposed skin Neuro: Alert and interactive  Psych: euthymic, appropriate    Data review:   Labs reviewed, notable for:  Lactate 2.5, down to 1 Bicarb 20 Creatinine to near baseline WBC 15  Micro:  Results for orders placed or performed during the hospital encounter of 09/14/21  Blood culture (routine x 2)     Status: None   Collection Time: 09/14/21  2:24 PM   Specimen: BLOOD LEFT FOREARM  Result Value Ref Range Status   Specimen Description BLOOD LEFT FOREARM  Final   Special Requests   Final    BOTTLES DRAWN AEROBIC AND ANAEROBIC Blood Culture adequate volume   Culture   Final    NO GROWTH 5 DAYS Performed at [facility], [address], [place], [place] [address]    Report Status 09/19/2021 FINAL  Final  Blood culture (routine x 2)     Status: None   Collection Time: 09/14/21  2:29 PM   Specimen: BLOOD LEFT FOREARM  Result Value Ref Range Status   Specimen Description BLOOD LEFT FOREARM  Final   Special Requests   Final    BOTTLES DRAWN AEROBIC AND ANAEROBIC Blood Culture adequate volume   Culture   Final    NO GROWTH 5 DAYS Performed at [facility], [address] [place], [place] [address]    Report Status  09/19/2021 FINAL  Final    Imaging reviewed:  DG CHEST PORT 1 VIEW  Result Date: 01/22/2023 CLINICAL DATA:  Sepsis, leg pain. EXAM: PORTABLE CHEST 1 VIEW COMPARISON:  December 24, 2022 FINDINGS: There is stable mild to moderate severity cardiac silhouette enlargement. Mild prominence of the pulmonary vasculature is seen. Low lung volumes are noted. There is no evidence of focal consolidation, pleural effusion or pneumothorax. The visualized skeletal structures are unremarkable. IMPRESSION: Stable cardiomegaly with mild pulmonary vascular congestion. Electronically Signed   By: Aram Candela M.D.   On: 01/22/2023 01:53   DG Foot Complete Right  Result Date: 01/21/2023 CLINICAL DATA:  2nd toe ulcer EXAM: RIGHT FOOT COMPLETE - 3+ VIEW COMPARISON:  09/14/2021. FINDINGS: Bone mineralization within normal limits for patient's age. Since the prior study, there is amputation of great toe. There is malunited fracture deformity of the second through fourth metatarsal. There are marked asymmetric degenerative changes of the second tarsometatarsal joint. No significant arthritis of other imaged joints. Note is made of pes planus deformity. No acute fracture or dislocation. No aggressive osseous lesion. No focal erosion noted along the tip of second toe. No evidence of air within the soft tissue. Ankle mortise appears intact. No radiopaque foreign bodies. IMPRESSION: *No radiographic evidence of osteomyelitis. If there is continued clinical concern, MRI is recommended. *Multiple other nonacute observations, as described above. Electronically Signed   By: Jules Schick M.D.   On: 01/21/2023 17:56   VAS [place] LOWER EXTREMITY VENOUS (DVT) (7a-7p)  Result Date: 01/21/2023  Lower Venous DVT Study Patient Name:  Randy [name]  Date of Exam:   01/21/2023 Medical Rec #: [id number]       Accession #:    [id number] Date of Birth: [birth year]       Patient Gender: M Patient Age:   52 years Exam Location:  [facility]  Procedure:      VAS  History and Physical    Thinh Cuccaro ZOX:[id number] DOB: April 17, 1971 DOA: 01/21/2023  PCP: Pcp, No   Patient coming from: Home   Chief Complaint:  Chief Complaint  Patient presents with   Leg Pain    HPI:  Randy [name] is a 52 y.o. 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  Denies any noticeable rash in the area.  Has chronic wound involving the right foot, and an ulcer on the right second toe.  Reports associated rigors.  No cough/cold symptoms, chest pain, abdominal pain, nausea, vomiting, diarrhea, dysuria.  No injury to the area of the right thigh.  No pain localizing to the knee.  No indwelling hardware.  Delayed coming into the ED because it is his birthday.    Review of Systems:  ROS complete and negative except as marked above   Allergies  Allergen Reactions   Fish-Derived Products Anaphylaxis    Prior to Admission medications   Medication Sig Start Date End Date Taking? Authorizing Provider  apixaban (ELIQUIS) 5 MG TABS tablet Take 1 tablet (5 mg total) by mouth 2 (two) times daily. 03/02/22   Clegg, Amy D, NP  atorvastatin (LIPITOR) 20 MG tablet Take 1 tablet (20 mg total) by mouth at bedtime. 01/15/22   Bensimhon, Bevelyn Buckles, MD  Continuous Blood Gluc Sensor (FREESTYLE LIBRE 3 SENSOR) MISC 1 each by Does not apply route every 14 (fourteen) days. 05/06/22   Carlus Pavlov, MD  ENTRESTO 97-103 MG Take 1 tablet by mouth twice daily 01/21/22   Clegg, Amy D, NP  furosemide (LASIX) 20 MG tablet Take 2 tablets (40 mg total) by mouth daily. 04/22/22   Bensimhon, Bevelyn Buckles, MD  isosorbide-hydrALAZINE (BIDIL) 20-37.5 MG tablet Take 2 tablets by mouth 3 (three) times daily. 12/22/22   Camnitz, Will Daphine Deutscher, MD  JARDIANCE 10 MG TABS tablet  Take 1 tablet by mouth once daily 11/13/22   Bensimhon, Bevelyn Buckles, MD  metoprolol succinate (TOPROL-XL) 100 MG 24 hr tablet Take 1 tablet (100 mg total) by mouth at bedtime. Take with or immediately following a meal. 12/24/22   Tillery, Mariam Dollar, PA-C  tirzepatide [facility]) 12.5 MG/0.5ML Pen Inject 12.5 mg into the skin once a week. 11/05/22   Carlus Pavlov, MD    Past Medical History:  Diagnosis Date   CKD (chronic kidney disease) stage 3, GFR 30-59 ml/min (HCC)    baseline Cr 1.5-1.7   Decreased visual acuity    Left eye, resolved - hypertensive retinopathy   Diabetes mellitus 2012   Type II   Hilar adenopathy    on CT scan 02/2010, on rpt scan stable/improved.   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  Denies any noticeable rash in the area.  Has chronic wound involving the right foot, and an ulcer on the right second toe.  Reports associated rigors.  No cough/cold symptoms, chest pain, abdominal pain, nausea, vomiting, diarrhea, dysuria.  No injury to the area of the right thigh.  No pain localizing to the knee.  No indwelling hardware.  Delayed coming into the ED because it is his birthday.    Review of Systems:  ROS complete and negative except as marked above   Allergies  Allergen Reactions   Fish-Derived Products Anaphylaxis    Prior to Admission medications   Medication Sig Start Date End Date Taking? Authorizing Provider  apixaban (ELIQUIS) 5 MG TABS tablet Take 1 tablet (5 mg total) by mouth 2 (two) times daily. 03/02/22   Clegg, Amy D, NP  atorvastatin (LIPITOR) 20 MG tablet Take 1 tablet (20 mg total) by mouth at bedtime. 01/15/22   Bensimhon, Bevelyn Buckles, MD  Continuous Blood Gluc Sensor (FREESTYLE LIBRE 3 SENSOR) MISC 1 each by Does not apply route every 14 (fourteen) days. 05/06/22   Carlus Pavlov, MD  ENTRESTO 97-103 MG Take 1 tablet by mouth twice daily 01/21/22   Clegg, Amy D, NP  furosemide (LASIX) 20 MG tablet Take 2 tablets (40 mg total) by mouth daily. 04/22/22   Bensimhon, Bevelyn Buckles, MD  isosorbide-hydrALAZINE (BIDIL) 20-37.5 MG tablet Take 2 tablets by mouth 3 (three) times daily. 12/22/22   Camnitz, Will Daphine Deutscher, MD  JARDIANCE 10 MG TABS tablet  Take 1 tablet by mouth once daily 11/13/22   Bensimhon, Bevelyn Buckles, MD  metoprolol succinate (TOPROL-XL) 100 MG 24 hr tablet Take 1 tablet (100 mg total) by mouth at bedtime. Take with or immediately following a meal. 12/24/22   Tillery, Mariam Dollar, PA-C  tirzepatide [facility]) 12.5 MG/0.5ML Pen Inject 12.5 mg into the skin once a week. 11/05/22   Carlus Pavlov, MD    Past Medical History:  Diagnosis Date   CKD (chronic kidney disease) stage 3, GFR 30-59 ml/min (HCC)    baseline Cr 1.5-1.7   Decreased visual acuity    Left eye, resolved - hypertensive retinopathy   Diabetes mellitus 2012   Type II   Hilar adenopathy    on CT scan 02/2010, on rpt scan stable/improved.   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  Right second toe with ulcer at the distal tip.  No other wounds on the feet. Skin: See MSK for more findings.  Otherwise no rashes or lesions to exposed skin Neuro: Alert and interactive  Psych: euthymic, appropriate    Data review:   Labs reviewed, notable for:  Lactate 2.5, down to 1 Bicarb 20 Creatinine to near baseline WBC 15  Micro:  Results for orders placed or performed during the hospital encounter of 09/14/21  Blood culture (routine x 2)     Status: None   Collection Time: 09/14/21  2:24 PM   Specimen: BLOOD LEFT FOREARM  Result Value Ref Range Status   Specimen Description BLOOD LEFT FOREARM  Final   Special Requests   Final    BOTTLES DRAWN AEROBIC AND ANAEROBIC Blood Culture adequate volume   Culture   Final    NO GROWTH 5 DAYS Performed at [facility], [address], [place], [place] [address]    Report Status 09/19/2021 FINAL  Final  Blood culture (routine x 2)     Status: None   Collection Time: 09/14/21  2:29 PM   Specimen: BLOOD LEFT FOREARM  Result Value Ref Range Status   Specimen Description BLOOD LEFT FOREARM  Final   Special Requests   Final    BOTTLES DRAWN AEROBIC AND ANAEROBIC Blood Culture adequate volume   Culture   Final    NO GROWTH 5 DAYS Performed at [facility], [address] [place], [place] [address]    Report Status  09/19/2021 FINAL  Final    Imaging reviewed:  DG CHEST PORT 1 VIEW  Result Date: 01/22/2023 CLINICAL DATA:  Sepsis, leg pain. EXAM: PORTABLE CHEST 1 VIEW COMPARISON:  December 24, 2022 FINDINGS: There is stable mild to moderate severity cardiac silhouette enlargement. Mild prominence of the pulmonary vasculature is seen. Low lung volumes are noted. There is no evidence of focal consolidation, pleural effusion or pneumothorax. The visualized skeletal structures are unremarkable. IMPRESSION: Stable cardiomegaly with mild pulmonary vascular congestion. Electronically Signed   By: Aram Candela M.D.   On: 01/22/2023 01:53   DG Foot Complete Right  Result Date: 01/21/2023 CLINICAL DATA:  2nd toe ulcer EXAM: RIGHT FOOT COMPLETE - 3+ VIEW COMPARISON:  09/14/2021. FINDINGS: Bone mineralization within normal limits for patient's age. Since the prior study, there is amputation of great toe. There is malunited fracture deformity of the second through fourth metatarsal. There are marked asymmetric degenerative changes of the second tarsometatarsal joint. No significant arthritis of other imaged joints. Note is made of pes planus deformity. No acute fracture or dislocation. No aggressive osseous lesion. No focal erosion noted along the tip of second toe. No evidence of air within the soft tissue. Ankle mortise appears intact. No radiopaque foreign bodies. IMPRESSION: *No radiographic evidence of osteomyelitis. If there is continued clinical concern, MRI is recommended. *Multiple other nonacute observations, as described above. Electronically Signed   By: Jules Schick M.D.   On: 01/21/2023 17:56   VAS [place] LOWER EXTREMITY VENOUS (DVT) (7a-7p)  Result Date: 01/21/2023  Lower Venous DVT Study Patient Name:  Randy [name]  Date of Exam:   01/21/2023 Medical Rec #: [id number]       Accession #:    [id number] Date of Birth: [birth year]       Patient Gender: M Patient Age:   52 years Exam Location:  [facility]  Procedure:      VAS  History and Physical    Thinh Cuccaro ZOX:[id number] DOB: April 17, 1971 DOA: 01/21/2023  PCP: Pcp, No   Patient coming from: Home   Chief Complaint:  Chief Complaint  Patient presents with   Leg Pain    HPI:  Randy [name] is a 52 y.o. 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  Denies any noticeable rash in the area.  Has chronic wound involving the right foot, and an ulcer on the right second toe.  Reports associated rigors.  No cough/cold symptoms, chest pain, abdominal pain, nausea, vomiting, diarrhea, dysuria.  No injury to the area of the right thigh.  No pain localizing to the knee.  No indwelling hardware.  Delayed coming into the ED because it is his birthday.    Review of Systems:  ROS complete and negative except as marked above   Allergies  Allergen Reactions   Fish-Derived Products Anaphylaxis    Prior to Admission medications   Medication Sig Start Date End Date Taking? Authorizing Provider  apixaban (ELIQUIS) 5 MG TABS tablet Take 1 tablet (5 mg total) by mouth 2 (two) times daily. 03/02/22   Clegg, Amy D, NP  atorvastatin (LIPITOR) 20 MG tablet Take 1 tablet (20 mg total) by mouth at bedtime. 01/15/22   Bensimhon, Bevelyn Buckles, MD  Continuous Blood Gluc Sensor (FREESTYLE LIBRE 3 SENSOR) MISC 1 each by Does not apply route every 14 (fourteen) days. 05/06/22   Carlus Pavlov, MD  ENTRESTO 97-103 MG Take 1 tablet by mouth twice daily 01/21/22   Clegg, Amy D, NP  furosemide (LASIX) 20 MG tablet Take 2 tablets (40 mg total) by mouth daily. 04/22/22   Bensimhon, Bevelyn Buckles, MD  isosorbide-hydrALAZINE (BIDIL) 20-37.5 MG tablet Take 2 tablets by mouth 3 (three) times daily. 12/22/22   Camnitz, Will Daphine Deutscher, MD  JARDIANCE 10 MG TABS tablet  Take 1 tablet by mouth once daily 11/13/22   Bensimhon, Bevelyn Buckles, MD  metoprolol succinate (TOPROL-XL) 100 MG 24 hr tablet Take 1 tablet (100 mg total) by mouth at bedtime. Take with or immediately following a meal. 12/24/22   Tillery, Mariam Dollar, PA-C  tirzepatide [facility]) 12.5 MG/0.5ML Pen Inject 12.5 mg into the skin once a week. 11/05/22   Carlus Pavlov, MD    Past Medical History:  Diagnosis Date   CKD (chronic kidney disease) stage 3, GFR 30-59 ml/min (HCC)    baseline Cr 1.5-1.7   Decreased visual acuity    Left eye, resolved - hypertensive retinopathy   Diabetes mellitus 2012   Type II   Hilar adenopathy    on CT scan 02/2010, on rpt scan stable/improved.   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  Denies any noticeable rash in the area.  Has chronic wound involving the right foot, and an ulcer on the right second toe.  Reports associated rigors.  No cough/cold symptoms, chest pain, abdominal pain, nausea, vomiting, diarrhea, dysuria.  No injury to the area of the right thigh.  No pain localizing to the knee.  No indwelling hardware.  Delayed coming into the ED because it is his birthday.    Review of Systems:  ROS complete and negative except as marked above   Allergies  Allergen Reactions   Fish-Derived Products Anaphylaxis    Prior to Admission medications   Medication Sig Start Date End Date Taking? Authorizing Provider  apixaban (ELIQUIS) 5 MG TABS tablet Take 1 tablet (5 mg total) by mouth 2 (two) times daily. 03/02/22   Clegg, Amy D, NP  atorvastatin (LIPITOR) 20 MG tablet Take 1 tablet (20 mg total) by mouth at bedtime. 01/15/22   Bensimhon, Bevelyn Buckles, MD  Continuous Blood Gluc Sensor (FREESTYLE LIBRE 3 SENSOR) MISC 1 each by Does not apply route every 14 (fourteen) days. 05/06/22   Carlus Pavlov, MD  ENTRESTO 97-103 MG Take 1 tablet by mouth twice daily 01/21/22   Clegg, Amy D, NP  furosemide (LASIX) 20 MG tablet Take 2 tablets (40 mg total) by mouth daily. 04/22/22   Bensimhon, Bevelyn Buckles, MD  isosorbide-hydrALAZINE (BIDIL) 20-37.5 MG tablet Take 2 tablets by mouth 3 (three) times daily. 12/22/22   Camnitz, Will Daphine Deutscher, MD  JARDIANCE 10 MG TABS tablet  Take 1 tablet by mouth once daily 11/13/22   Bensimhon, Bevelyn Buckles, MD  metoprolol succinate (TOPROL-XL) 100 MG 24 hr tablet Take 1 tablet (100 mg total) by mouth at bedtime. Take with or immediately following a meal. 12/24/22   Tillery, Mariam Dollar, PA-C  tirzepatide [facility]) 12.5 MG/0.5ML Pen Inject 12.5 mg into the skin once a week. 11/05/22   Carlus Pavlov, MD    Past Medical History:  Diagnosis Date   CKD (chronic kidney disease) stage 3, GFR 30-59 ml/min (HCC)    baseline Cr 1.5-1.7   Decreased visual acuity    Left eye, resolved - hypertensive retinopathy   Diabetes mellitus 2012   Type II   Hilar adenopathy    on CT scan 02/2010, on rpt scan stable/improved.   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

## 2023-01-22 NOTE — Plan of Care (Signed)
  Problem: Clinical Measurements: Goal: Diagnostic test results will improve Outcome: Progressing   

## 2023-01-23 DIAGNOSIS — I4811 Longstanding persistent atrial fibrillation: Secondary | ICD-10-CM

## 2023-01-23 DIAGNOSIS — N179 Acute kidney failure, unspecified: Secondary | ICD-10-CM | POA: Diagnosis not present

## 2023-01-23 DIAGNOSIS — A419 Sepsis, unspecified organism: Secondary | ICD-10-CM | POA: Diagnosis not present

## 2023-01-23 DIAGNOSIS — L03115 Cellulitis of right lower limb: Secondary | ICD-10-CM | POA: Diagnosis not present

## 2023-01-23 DIAGNOSIS — N1832 Chronic kidney disease, stage 3b: Secondary | ICD-10-CM | POA: Diagnosis not present

## 2023-01-23 LAB — CBC
HCT: 37.7 % — ABNORMAL LOW (ref 39.0–52.0)
Hemoglobin: 11.9 g/dL — ABNORMAL LOW (ref 13.0–17.0)
MCH: 24.8 pg — ABNORMAL LOW (ref 26.0–34.0)
MCHC: 31.6 g/dL (ref 30.0–36.0)
MCV: 78.7 fL — ABNORMAL LOW (ref 80.0–100.0)
Platelets: 197 10*3/uL (ref 150–400)
RBC: 4.79 MIL/uL (ref 4.22–5.81)
RDW: 16.1 % — ABNORMAL HIGH (ref 11.5–15.5)
WBC: 20.8 10*3/uL — ABNORMAL HIGH (ref 4.0–10.5)
nRBC: 0 % (ref 0.0–0.2)

## 2023-01-23 LAB — COMPREHENSIVE METABOLIC PANEL
ALT: 61 U/L — ABNORMAL HIGH (ref 0–44)
AST: 56 U/L — ABNORMAL HIGH (ref 15–41)
Albumin: 2.6 g/dL — ABNORMAL LOW (ref 3.5–5.0)
Alkaline Phosphatase: 101 U/L (ref 38–126)
Anion gap: 8 (ref 5–15)
BUN: 22 mg/dL — ABNORMAL HIGH (ref 6–20)
CO2: 21 mmol/L — ABNORMAL LOW (ref 22–32)
Calcium: 8.1 mg/dL — ABNORMAL LOW (ref 8.9–10.3)
Chloride: 101 mmol/L (ref 98–111)
Creatinine, Ser: 2.51 mg/dL — ABNORMAL HIGH (ref 0.61–1.24)
GFR, Estimated: 30 mL/min — ABNORMAL LOW (ref >60)
Glucose, Bld: 134 mg/dL — ABNORMAL HIGH (ref 70–99)
Potassium: 3.5 mmol/L (ref 3.5–5.1)
Sodium: 130 mmol/L — ABNORMAL LOW (ref 135–145)
Total Bilirubin: 1 mg/dL (ref 0.3–1.2)
Total Protein: 6.5 g/dL (ref 6.5–8.1)

## 2023-01-23 LAB — MRSA NEXT GEN BY PCR, NASAL: MRSA by PCR Next Gen: NOT DETECTED

## 2023-01-23 LAB — GLUCOSE, CAPILLARY
Glucose-Capillary: 130 mg/dL — ABNORMAL HIGH (ref 70–99)
Glucose-Capillary: 148 mg/dL — ABNORMAL HIGH (ref 70–99)
Glucose-Capillary: 147 mg/dL — ABNORMAL HIGH (ref 70–99)
Glucose-Capillary: 166 mg/dL — ABNORMAL HIGH (ref 70–99)

## 2023-01-23 MED ORDER — POTASSIUM CHLORIDE CRYS ER 20 MEQ PO TBCR
20.0000 meq | EXTENDED_RELEASE_TABLET | Freq: Once | ORAL | Status: AC
  Administered 2023-01-23: 20 meq via ORAL
  Filled 2023-01-23: qty 1

## 2023-01-23 MED ORDER — SODIUM CHLORIDE 0.9 % IV SOLN: INTRAVENOUS | Status: DC

## 2023-01-23 MED ORDER — METOPROLOL TARTRATE 5 MG/5ML IV SOLN
5.0000 mg | Freq: Four times a day (QID) | INTRAVENOUS | Status: DC | PRN
  Administered 2023-01-23 – 2023-01-25 (×2): 5 mg via INTRAVENOUS
  Filled 2023-01-23 (×2): qty 5

## 2023-01-23 NOTE — Plan of Care (Signed)
  Problem: Education: Goal: Understanding of disease, treatment, and recovery process will improve Outcome: Progressing

## 2023-01-23 NOTE — Progress Notes (Signed)
along the resected first ray measuring about 10.7 by 2.8 by 5.4 cm, with heterogeneous T2 signal, probably representing granulation tissue. This extends over towards the second metatarsal base as on image 17 of series 11 Dorsal subcutaneous edema in the forefoot extends into the second and third toes. No well-defined drainable abscess. IMPRESSION: 1. 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 Along the nonunion site there is heterogeneous but mainly intermediate T1 signal characteristics measuring about 2.7 by 2.0 cm, with erosive findings along the proximal margin of the distal fragment, and edema in the proximal shaft of the second metatarsal. The possibility of chronic osteomyelitis along this nonunion site is raised. Erosive sterile granulomatous response is also a possibility. 2. Extensive adjacent intermediate T1 signal in the soft tissues along the resected first ray, probably granulomatous tissue. 3. Edema in the distal phalanx of the second toe, osteomyelitis is a distinct possibility. 4. Abnormal band of intermediate T1 signal in the soft tissues along the medial foot along the resected first ray measuring about 10.7 by 2.8 by 5.4 cm, probably representing granulation tissue. 5. Dorsal subcutaneous edema in the forefoot extends into the second  and third toes. 6. Moderate muscular atrophy and low-level generalized muscular edema which is probably neurogenic. 7. Deformities of the third and fourth metatarsals due to callus and old fracture. 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 Electronically Signed   By: Gaylyn Rong M.D.   On: 01/22/2023 08:49   DG CHEST PORT 1 VIEW  Result Date: 01/22/2023 CLINICAL DATA:  Sepsis, leg pain. EXAM: PORTABLE CHEST 1 VIEW COMPARISON:  December 24, 2022 FINDINGS: There is stable mild to moderate severity cardiac silhouette enlargement. Mild prominence of the pulmonary vasculature is seen. Low lung volumes are noted. There is no evidence of focal consolidation, pleural effusion or pneumothorax. The visualized skeletal structures are unremarkable. IMPRESSION: Stable cardiomegaly with mild pulmonary vascular congestion. Electronically Signed   By: Aram Candela M.D.   On: 01/22/2023 01:53   DG Foot Complete Right  Result Date: 01/21/2023 CLINICAL DATA:  2nd toe ulcer EXAM: RIGHT FOOT COMPLETE - 3+ VIEW COMPARISON:  09/14/2021. FINDINGS: Bone mineralization within normal limits for patient's age. Since the prior study, there is amputation of great toe. There is malunited fracture deformity of the second through fourth metatarsal. There are marked asymmetric degenerative changes of the second tarsometatarsal joint. No significant arthritis of other imaged joints. Note is made of pes planus deformity. No acute fracture or dislocation. No aggressive osseous lesion. No focal erosion noted along the tip of second toe. No evidence of air within the soft tissue. Ankle mortise appears intact. No radiopaque foreign bodies. IMPRESSION: *No radiographic evidence of osteomyelitis. If there is continued clinical concern, MRI is recommended. *Multiple other nonacute observations, as described above. Electronically Signed   By: Jules Schick M.D.   On: 01/21/2023 17:56   VAS [place] LOWER  EXTREMITY VENOUS (DVT) (7a-7p)  Result Date: 01/21/2023  Lower Venous DVT Study Patient Name:  Randy [name]  Date of Exam:   01/21/2023 Medical Rec #: [id number]       Accession #:    [id number] Date of Birth: 11/06/1970       Patient Gender: M Patient Age:   52 years Exam Location:  [facility] [place] Procedure:      VAS [place] LOWER EXTREMITY VENOUS (DVT)  along the resected first ray measuring about 10.7 by 2.8 by 5.4 cm, with heterogeneous T2 signal, probably representing granulation tissue. This extends over towards the second metatarsal base as on image 17 of series 11 Dorsal subcutaneous edema in the forefoot extends into the second and third toes. No well-defined drainable abscess. IMPRESSION: 1. 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 Along the nonunion site there is heterogeneous but mainly intermediate T1 signal characteristics measuring about 2.7 by 2.0 cm, with erosive findings along the proximal margin of the distal fragment, and edema in the proximal shaft of the second metatarsal. The possibility of chronic osteomyelitis along this nonunion site is raised. Erosive sterile granulomatous response is also a possibility. 2. Extensive adjacent intermediate T1 signal in the soft tissues along the resected first ray, probably granulomatous tissue. 3. Edema in the distal phalanx of the second toe, osteomyelitis is a distinct possibility. 4. Abnormal band of intermediate T1 signal in the soft tissues along the medial foot along the resected first ray measuring about 10.7 by 2.8 by 5.4 cm, probably representing granulation tissue. 5. Dorsal subcutaneous edema in the forefoot extends into the second  and third toes. 6. Moderate muscular atrophy and low-level generalized muscular edema which is probably neurogenic. 7. Deformities of the third and fourth metatarsals due to callus and old fracture. 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 Electronically Signed   By: Gaylyn Rong M.D.   On: 01/22/2023 08:49   DG CHEST PORT 1 VIEW  Result Date: 01/22/2023 CLINICAL DATA:  Sepsis, leg pain. EXAM: PORTABLE CHEST 1 VIEW COMPARISON:  December 24, 2022 FINDINGS: There is stable mild to moderate severity cardiac silhouette enlargement. Mild prominence of the pulmonary vasculature is seen. Low lung volumes are noted. There is no evidence of focal consolidation, pleural effusion or pneumothorax. The visualized skeletal structures are unremarkable. IMPRESSION: Stable cardiomegaly with mild pulmonary vascular congestion. Electronically Signed   By: Aram Candela M.D.   On: 01/22/2023 01:53   DG Foot Complete Right  Result Date: 01/21/2023 CLINICAL DATA:  2nd toe ulcer EXAM: RIGHT FOOT COMPLETE - 3+ VIEW COMPARISON:  09/14/2021. FINDINGS: Bone mineralization within normal limits for patient's age. Since the prior study, there is amputation of great toe. There is malunited fracture deformity of the second through fourth metatarsal. There are marked asymmetric degenerative changes of the second tarsometatarsal joint. No significant arthritis of other imaged joints. Note is made of pes planus deformity. No acute fracture or dislocation. No aggressive osseous lesion. No focal erosion noted along the tip of second toe. No evidence of air within the soft tissue. Ankle mortise appears intact. No radiopaque foreign bodies. IMPRESSION: *No radiographic evidence of osteomyelitis. If there is continued clinical concern, MRI is recommended. *Multiple other nonacute observations, as described above. Electronically Signed   By: Jules Schick M.D.   On: 01/21/2023 17:56   VAS [place] LOWER  EXTREMITY VENOUS (DVT) (7a-7p)  Result Date: 01/21/2023  Lower Venous DVT Study Patient Name:  Randy [name]  Date of Exam:   01/21/2023 Medical Rec #: [id number]       Accession #:    [id number] Date of Birth: 11/06/1970       Patient Gender: M Patient Age:   52 years Exam Location:  [facility] [place] Procedure:      VAS [place] LOWER EXTREMITY VENOUS (DVT)  along the resected first ray measuring about 10.7 by 2.8 by 5.4 cm, with heterogeneous T2 signal, probably representing granulation tissue. This extends over towards the second metatarsal base as on image 17 of series 11 Dorsal subcutaneous edema in the forefoot extends into the second and third toes. No well-defined drainable abscess. IMPRESSION: 1. 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 Along the nonunion site there is heterogeneous but mainly intermediate T1 signal characteristics measuring about 2.7 by 2.0 cm, with erosive findings along the proximal margin of the distal fragment, and edema in the proximal shaft of the second metatarsal. The possibility of chronic osteomyelitis along this nonunion site is raised. Erosive sterile granulomatous response is also a possibility. 2. Extensive adjacent intermediate T1 signal in the soft tissues along the resected first ray, probably granulomatous tissue. 3. Edema in the distal phalanx of the second toe, osteomyelitis is a distinct possibility. 4. Abnormal band of intermediate T1 signal in the soft tissues along the medial foot along the resected first ray measuring about 10.7 by 2.8 by 5.4 cm, probably representing granulation tissue. 5. Dorsal subcutaneous edema in the forefoot extends into the second  and third toes. 6. Moderate muscular atrophy and low-level generalized muscular edema which is probably neurogenic. 7. Deformities of the third and fourth metatarsals due to callus and old fracture. 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 Electronically Signed   By: Gaylyn Rong M.D.   On: 01/22/2023 08:49   DG CHEST PORT 1 VIEW  Result Date: 01/22/2023 CLINICAL DATA:  Sepsis, leg pain. EXAM: PORTABLE CHEST 1 VIEW COMPARISON:  December 24, 2022 FINDINGS: There is stable mild to moderate severity cardiac silhouette enlargement. Mild prominence of the pulmonary vasculature is seen. Low lung volumes are noted. There is no evidence of focal consolidation, pleural effusion or pneumothorax. The visualized skeletal structures are unremarkable. IMPRESSION: Stable cardiomegaly with mild pulmonary vascular congestion. Electronically Signed   By: Aram Candela M.D.   On: 01/22/2023 01:53   DG Foot Complete Right  Result Date: 01/21/2023 CLINICAL DATA:  2nd toe ulcer EXAM: RIGHT FOOT COMPLETE - 3+ VIEW COMPARISON:  09/14/2021. FINDINGS: Bone mineralization within normal limits for patient's age. Since the prior study, there is amputation of great toe. There is malunited fracture deformity of the second through fourth metatarsal. There are marked asymmetric degenerative changes of the second tarsometatarsal joint. No significant arthritis of other imaged joints. Note is made of pes planus deformity. No acute fracture or dislocation. No aggressive osseous lesion. No focal erosion noted along the tip of second toe. No evidence of air within the soft tissue. Ankle mortise appears intact. No radiopaque foreign bodies. IMPRESSION: *No radiographic evidence of osteomyelitis. If there is continued clinical concern, MRI is recommended. *Multiple other nonacute observations, as described above. Electronically Signed   By: Jules Schick M.D.   On: 01/21/2023 17:56   VAS [place] LOWER  EXTREMITY VENOUS (DVT) (7a-7p)  Result Date: 01/21/2023  Lower Venous DVT Study Patient Name:  Randy [name]  Date of Exam:   01/21/2023 Medical Rec #: [id number]       Accession #:    [id number] Date of Birth: 11/06/1970       Patient Gender: M Patient Age:   52 years Exam Location:  [facility] [place] Procedure:      VAS [place] LOWER EXTREMITY VENOUS (DVT)  [facility] PROGRESS NOTE   Randy [name] WUJ:[id number] DOB: 05/25/1970 DOA: 01/21/2023  PCP: Pcp, No  Brief History: 52 y.o. 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  Denies any noticeable rash in the area.  Has chronic wound involving the right foot, and an ulcer on the right second toe.  Reports associated rigors.  No cough/cold symptoms, chest pain, abdominal pain, nausea, vomiting, diarrhea, dysuria.  No injury to the area of the right thigh.  No pain localizing to the knee.  No indwelling hardware.    Consultants:  Dr. Lajoyce Corners   Procedures: None yet    Subjective/Interval History: Planes of pain in the right leg only when he tries to walk on it.  Denies any leg pain this morning.  No chest pain shortness of breath.  Denies palpitations.  No dizziness or lightheadedness.     Assessment/Plan:  Sepsis likely source being right foot versus right lower extremity cellulitis/lactic acidosis Presented with fever leukocytosis tachycardia and tachypnea.  However no clear source of infection was initially identified.  Patient denies any respiratory symptoms nausea vomiting diarrhea or dysuria. His right lower extremity was noted to be swollen and warm to touch but no clear erythema was noted.  He does have lymphadenopathy in the right inguinal area.  CT of the right thigh was done which showed only a lymphadenopathy without any abscess. Right foot did not appear to be overtly infected based on clinical exam.  MRI was done which shows multiple findings raising concern for osteomyelitis.  Seen by Dr. Lajoyce Corners who does not feel the patient clinically has osteomyelitis.  He felt the patient had cellulitis.  It certainly appears to be that.  Still has  fevers.  Patient on broad-spectrum antibiotics with vancomycin cefepime and metronidazole. WBC remains elevated.  Will check procalcitonin levels.  Blood cultures negative so far. Doppler studies negative for DVT. Lactic acid level has improved.  Chronic systolic CHF Last echocardiogram done earlier this month showed improvement in EF to 50 to 55%.  Furosemide is on hold.  His other goal-directed medical treatments are also on hold currently due to his acute issues.  Acute on chronic kidney disease stage IIIb Baseline creatinine is near 2.  Patient experiencing acute kidney injury due to sepsis and possible mild hypovolemia.  Furosemide is on hold.  He was given IV fluids yesterday.  Will give him more IV fluids today.  Avoid nephrotoxic agents.  Monitor urine output.  Recheck labs tomorrow.    Hyponatremia and hypokalemia Sodium level remains low.  Potassium is better.  Continue to monitor.    Paroxysmal atrial fibrillation He is status post ablation.  He is on metoprolol and apixaban.  Heart rate is poorly controlled likely due to acute illness and fever.  Metoprolol IV as needed.  He is however asymptomatic.  History of CVA Statin and anticoagulation being continued.  Microcytic anemia Likely due to acute illness.  Check anemia panel.  No overt bleeding noted.  Diabetes mellitus type 2 in obese Holding Mounjaro or Jardiance.  SSI.  Morbid obesity Estimated body mass index is 42.31 kg/m as calculated from the following:   Height as of this encounter: 6\' 8"  (2.032 m).   Weight as of this encounter: 174.7 kg.  DVT Prophylaxis: On apixaban Code  [facility] PROGRESS NOTE   Randy [name] WUJ:[id number] DOB: 05/25/1970 DOA: 01/21/2023  PCP: Pcp, No  Brief History: 52 y.o. 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  Denies any noticeable rash in the area.  Has chronic wound involving the right foot, and an ulcer on the right second toe.  Reports associated rigors.  No cough/cold symptoms, chest pain, abdominal pain, nausea, vomiting, diarrhea, dysuria.  No injury to the area of the right thigh.  No pain localizing to the knee.  No indwelling hardware.    Consultants:  Dr. Lajoyce Corners   Procedures: None yet    Subjective/Interval History: Planes of pain in the right leg only when he tries to walk on it.  Denies any leg pain this morning.  No chest pain shortness of breath.  Denies palpitations.  No dizziness or lightheadedness.     Assessment/Plan:  Sepsis likely source being right foot versus right lower extremity cellulitis/lactic acidosis Presented with fever leukocytosis tachycardia and tachypnea.  However no clear source of infection was initially identified.  Patient denies any respiratory symptoms nausea vomiting diarrhea or dysuria. His right lower extremity was noted to be swollen and warm to touch but no clear erythema was noted.  He does have lymphadenopathy in the right inguinal area.  CT of the right thigh was done which showed only a lymphadenopathy without any abscess. Right foot did not appear to be overtly infected based on clinical exam.  MRI was done which shows multiple findings raising concern for osteomyelitis.  Seen by Dr. Lajoyce Corners who does not feel the patient clinically has osteomyelitis.  He felt the patient had cellulitis.  It certainly appears to be that.  Still has  fevers.  Patient on broad-spectrum antibiotics with vancomycin cefepime and metronidazole. WBC remains elevated.  Will check procalcitonin levels.  Blood cultures negative so far. Doppler studies negative for DVT. Lactic acid level has improved.  Chronic systolic CHF Last echocardiogram done earlier this month showed improvement in EF to 50 to 55%.  Furosemide is on hold.  His other goal-directed medical treatments are also on hold currently due to his acute issues.  Acute on chronic kidney disease stage IIIb Baseline creatinine is near 2.  Patient experiencing acute kidney injury due to sepsis and possible mild hypovolemia.  Furosemide is on hold.  He was given IV fluids yesterday.  Will give him more IV fluids today.  Avoid nephrotoxic agents.  Monitor urine output.  Recheck labs tomorrow.    Hyponatremia and hypokalemia Sodium level remains low.  Potassium is better.  Continue to monitor.    Paroxysmal atrial fibrillation He is status post ablation.  He is on metoprolol and apixaban.  Heart rate is poorly controlled likely due to acute illness and fever.  Metoprolol IV as needed.  He is however asymptomatic.  History of CVA Statin and anticoagulation being continued.  Microcytic anemia Likely due to acute illness.  Check anemia panel.  No overt bleeding noted.  Diabetes mellitus type 2 in obese Holding Mounjaro or Jardiance.  SSI.  Morbid obesity Estimated body mass index is 42.31 kg/m as calculated from the following:   Height as of this encounter: 6\' 8"  (2.032 m).   Weight as of this encounter: 174.7 kg.  DVT Prophylaxis: On apixaban Code  [facility] PROGRESS NOTE   Randy [name] WUJ:[id number] DOB: 05/25/1970 DOA: 01/21/2023  PCP: Pcp, No  Brief History: 52 y.o. 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  Denies any noticeable rash in the area.  Has chronic wound involving the right foot, and an ulcer on the right second toe.  Reports associated rigors.  No cough/cold symptoms, chest pain, abdominal pain, nausea, vomiting, diarrhea, dysuria.  No injury to the area of the right thigh.  No pain localizing to the knee.  No indwelling hardware.    Consultants:  Dr. Lajoyce Corners   Procedures: None yet    Subjective/Interval History: Planes of pain in the right leg only when he tries to walk on it.  Denies any leg pain this morning.  No chest pain shortness of breath.  Denies palpitations.  No dizziness or lightheadedness.     Assessment/Plan:  Sepsis likely source being right foot versus right lower extremity cellulitis/lactic acidosis Presented with fever leukocytosis tachycardia and tachypnea.  However no clear source of infection was initially identified.  Patient denies any respiratory symptoms nausea vomiting diarrhea or dysuria. His right lower extremity was noted to be swollen and warm to touch but no clear erythema was noted.  He does have lymphadenopathy in the right inguinal area.  CT of the right thigh was done which showed only a lymphadenopathy without any abscess. Right foot did not appear to be overtly infected based on clinical exam.  MRI was done which shows multiple findings raising concern for osteomyelitis.  Seen by Dr. Lajoyce Corners who does not feel the patient clinically has osteomyelitis.  He felt the patient had cellulitis.  It certainly appears to be that.  Still has  fevers.  Patient on broad-spectrum antibiotics with vancomycin cefepime and metronidazole. WBC remains elevated.  Will check procalcitonin levels.  Blood cultures negative so far. Doppler studies negative for DVT. Lactic acid level has improved.  Chronic systolic CHF Last echocardiogram done earlier this month showed improvement in EF to 50 to 55%.  Furosemide is on hold.  His other goal-directed medical treatments are also on hold currently due to his acute issues.  Acute on chronic kidney disease stage IIIb Baseline creatinine is near 2.  Patient experiencing acute kidney injury due to sepsis and possible mild hypovolemia.  Furosemide is on hold.  He was given IV fluids yesterday.  Will give him more IV fluids today.  Avoid nephrotoxic agents.  Monitor urine output.  Recheck labs tomorrow.    Hyponatremia and hypokalemia Sodium level remains low.  Potassium is better.  Continue to monitor.    Paroxysmal atrial fibrillation He is status post ablation.  He is on metoprolol and apixaban.  Heart rate is poorly controlled likely due to acute illness and fever.  Metoprolol IV as needed.  He is however asymptomatic.  History of CVA Statin and anticoagulation being continued.  Microcytic anemia Likely due to acute illness.  Check anemia panel.  No overt bleeding noted.  Diabetes mellitus type 2 in obese Holding Mounjaro or Jardiance.  SSI.  Morbid obesity Estimated body mass index is 42.31 kg/m as calculated from the following:   Height as of this encounter: 6\' 8"  (2.032 m).   Weight as of this encounter: 174.7 kg.  DVT Prophylaxis: On apixaban Code  [facility] PROGRESS NOTE   Randy [name] WUJ:[id number] DOB: 05/25/1970 DOA: 01/21/2023  PCP: Pcp, No  Brief History: 52 y.o. 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  Denies any noticeable rash in the area.  Has chronic wound involving the right foot, and an ulcer on the right second toe.  Reports associated rigors.  No cough/cold symptoms, chest pain, abdominal pain, nausea, vomiting, diarrhea, dysuria.  No injury to the area of the right thigh.  No pain localizing to the knee.  No indwelling hardware.    Consultants:  Dr. Lajoyce Corners   Procedures: None yet    Subjective/Interval History: Planes of pain in the right leg only when he tries to walk on it.  Denies any leg pain this morning.  No chest pain shortness of breath.  Denies palpitations.  No dizziness or lightheadedness.     Assessment/Plan:  Sepsis likely source being right foot versus right lower extremity cellulitis/lactic acidosis Presented with fever leukocytosis tachycardia and tachypnea.  However no clear source of infection was initially identified.  Patient denies any respiratory symptoms nausea vomiting diarrhea or dysuria. His right lower extremity was noted to be swollen and warm to touch but no clear erythema was noted.  He does have lymphadenopathy in the right inguinal area.  CT of the right thigh was done which showed only a lymphadenopathy without any abscess. Right foot did not appear to be overtly infected based on clinical exam.  MRI was done which shows multiple findings raising concern for osteomyelitis.  Seen by Dr. Lajoyce Corners who does not feel the patient clinically has osteomyelitis.  He felt the patient had cellulitis.  It certainly appears to be that.  Still has  fevers.  Patient on broad-spectrum antibiotics with vancomycin cefepime and metronidazole. WBC remains elevated.  Will check procalcitonin levels.  Blood cultures negative so far. Doppler studies negative for DVT. Lactic acid level has improved.  Chronic systolic CHF Last echocardiogram done earlier this month showed improvement in EF to 50 to 55%.  Furosemide is on hold.  His other goal-directed medical treatments are also on hold currently due to his acute issues.  Acute on chronic kidney disease stage IIIb Baseline creatinine is near 2.  Patient experiencing acute kidney injury due to sepsis and possible mild hypovolemia.  Furosemide is on hold.  He was given IV fluids yesterday.  Will give him more IV fluids today.  Avoid nephrotoxic agents.  Monitor urine output.  Recheck labs tomorrow.    Hyponatremia and hypokalemia Sodium level remains low.  Potassium is better.  Continue to monitor.    Paroxysmal atrial fibrillation He is status post ablation.  He is on metoprolol and apixaban.  Heart rate is poorly controlled likely due to acute illness and fever.  Metoprolol IV as needed.  He is however asymptomatic.  History of CVA Statin and anticoagulation being continued.  Microcytic anemia Likely due to acute illness.  Check anemia panel.  No overt bleeding noted.  Diabetes mellitus type 2 in obese Holding Mounjaro or Jardiance.  SSI.  Morbid obesity Estimated body mass index is 42.31 kg/m as calculated from the following:   Height as of this encounter: 6\' 8"  (2.032 m).   Weight as of this encounter: 174.7 kg.  DVT Prophylaxis: On apixaban Code

## 2023-01-24 DIAGNOSIS — N179 Acute kidney failure, unspecified: Secondary | ICD-10-CM | POA: Diagnosis not present

## 2023-01-24 DIAGNOSIS — N1832 Chronic kidney disease, stage 3b: Secondary | ICD-10-CM | POA: Diagnosis not present

## 2023-01-24 DIAGNOSIS — A419 Sepsis, unspecified organism: Secondary | ICD-10-CM | POA: Diagnosis not present

## 2023-01-24 DIAGNOSIS — L03115 Cellulitis of right lower limb: Secondary | ICD-10-CM | POA: Diagnosis not present

## 2023-01-24 LAB — COMPREHENSIVE METABOLIC PANEL
ALT: 83 U/L — ABNORMAL HIGH (ref 0–44)
AST: 68 U/L — ABNORMAL HIGH (ref 15–41)
Albumin: 2.3 g/dL — ABNORMAL LOW (ref 3.5–5.0)
Alkaline Phosphatase: 91 U/L (ref 38–126)
Anion gap: 12 (ref 5–15)
BUN: 20 mg/dL (ref 6–20)
CO2: 20 mmol/L — ABNORMAL LOW (ref 22–32)
Calcium: 8.3 mg/dL — ABNORMAL LOW (ref 8.9–10.3)
Chloride: 100 mmol/L (ref 98–111)
Creatinine, Ser: 2.14 mg/dL — ABNORMAL HIGH (ref 0.61–1.24)
GFR, Estimated: 37 mL/min — ABNORMAL LOW (ref >60)
Glucose, Bld: 121 mg/dL — ABNORMAL HIGH (ref 70–99)
Potassium: 3.4 mmol/L — ABNORMAL LOW (ref 3.5–5.1)
Sodium: 132 mmol/L — ABNORMAL LOW (ref 135–145)
Total Bilirubin: 0.9 mg/dL (ref 0.3–1.2)
Total Protein: 6.3 g/dL — ABNORMAL LOW (ref 6.5–8.1)

## 2023-01-24 LAB — CBC
HCT: 36.8 % — ABNORMAL LOW (ref 39.0–52.0)
Hemoglobin: 11.9 g/dL — ABNORMAL LOW (ref 13.0–17.0)
MCH: 25.4 pg — ABNORMAL LOW (ref 26.0–34.0)
MCHC: 32.3 g/dL (ref 30.0–36.0)
MCV: 78.5 fL — ABNORMAL LOW (ref 80.0–100.0)
Platelets: 211 10*3/uL (ref 150–400)
RBC: 4.69 MIL/uL (ref 4.22–5.81)
RDW: 16.1 % — ABNORMAL HIGH (ref 11.5–15.5)
WBC: 21.1 10*3/uL — ABNORMAL HIGH (ref 4.0–10.5)
nRBC: 0 % (ref 0.0–0.2)

## 2023-01-24 LAB — GLUCOSE, CAPILLARY
Glucose-Capillary: 115 mg/dL — ABNORMAL HIGH (ref 70–99)
Glucose-Capillary: 148 mg/dL — ABNORMAL HIGH (ref 70–99)
Glucose-Capillary: 136 mg/dL — ABNORMAL HIGH (ref 70–99)
Glucose-Capillary: 183 mg/dL — ABNORMAL HIGH (ref 70–99)

## 2023-01-24 LAB — PROCALCITONIN: Procalcitonin: 7.6 ng/mL

## 2023-01-24 MED ORDER — POTASSIUM CHLORIDE CRYS ER 20 MEQ PO TBCR
40.0000 meq | EXTENDED_RELEASE_TABLET | Freq: Once | ORAL | Status: AC
  Administered 2023-01-24: 40 meq via ORAL
  Filled 2023-01-24: qty 2

## 2023-01-24 MED ORDER — MELATONIN 3 MG PO TABS
3.0000 mg | ORAL_TABLET | Freq: Every evening | ORAL | Status: DC | PRN
  Administered 2023-01-24 – 2023-01-26 (×2): 3 mg via ORAL
  Filled 2023-01-24 (×2): qty 1

## 2023-01-24 NOTE — Progress Notes (Signed)
Pharmacy Antibiotic Note  Randy [name] is a 52 y.o. male admitted on 01/21/2023 with right leg pain and chills over past 2 days and concerns for sepsis. DVT studies negative. Pharmacy has been consulted for vancomycin dosing.  MRI-osteo Blood cultures negative to date With low grade fever, WBC elevated  Plan: -Continue Vancomycin 1000mg  IV every 12 hours (AUC 445, IBW, Vd 0.5) -Continue Cefepime 2g IV every 8 hours -Continue Metronidazole 500 mg iv Q 12 hours -Monitor renal function -Follow up signs of clinical improvement, LOT, de-escalation of antibiotics, fever curve, and cultures  Height: 6\' 8"  (203.2 cm) Weight: (!) 174.7 kg (385 lb 2.3 oz) IBW/kg (Calculated) : 96  Temp (24hrs), Avg:99.5 F (37.5 C), Min:98.9 F (37.2 C), Max:100.5 F (38.1 C)  Recent Labs  Lab 01/21/23 1323 01/21/23 1655 01/21/23 1718 01/22/23 0053 01/22/23 1200 01/23/23 0801 01/24/23 0543  WBC 15.1*  --   --  20.6*  --  20.8* 21.1*  CREATININE 2.06*  --   --   --  2.38* 2.51* 2.14*  LATICACIDVEN  --  2.5* 1.0  --   --   --   --     Estimated Creatinine Clearance: 74.5 mL/min (A) (by C-G formula based on SCr of 2.14 mg/dL (H)).    Allergies  Allergen Reactions   Fish-Derived Products Anaphylaxis   Shellfish Allergy Anaphylaxis    Antimicrobials this admission: Cefepime 9/26 >>  Vancomycin 9/26 >>  Metronidazole 9/27>>  Microbiology results: 9/26 BCx: NGTD  Thank you Okey Regal, PharmD 01/24/2023 8:30 AM
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
## 2023-01-25 ENCOUNTER — Inpatient Hospital Stay (HOSPITAL_COMMUNITY): Payer: 59

## 2023-01-25 ENCOUNTER — Telehealth: Payer: Self-pay | Admitting: Family

## 2023-01-25 DIAGNOSIS — I4819 Other persistent atrial fibrillation: Secondary | ICD-10-CM | POA: Diagnosis not present

## 2023-01-25 DIAGNOSIS — I4892 Unspecified atrial flutter: Secondary | ICD-10-CM

## 2023-01-25 DIAGNOSIS — L03115 Cellulitis of right lower limb: Secondary | ICD-10-CM | POA: Diagnosis not present

## 2023-01-25 DIAGNOSIS — E871 Hypo-osmolality and hyponatremia: Secondary | ICD-10-CM

## 2023-01-25 LAB — GLUCOSE, CAPILLARY
Glucose-Capillary: 139 mg/dL — ABNORMAL HIGH (ref 70–99)
Glucose-Capillary: 138 mg/dL — ABNORMAL HIGH (ref 70–99)
Glucose-Capillary: 123 mg/dL — ABNORMAL HIGH (ref 70–99)
Glucose-Capillary: 132 mg/dL — ABNORMAL HIGH (ref 70–99)

## 2023-01-25 LAB — COMPREHENSIVE METABOLIC PANEL
ALT: 72 U/L — ABNORMAL HIGH (ref 0–44)
AST: 50 U/L — ABNORMAL HIGH (ref 15–41)
Albumin: 2.3 g/dL — ABNORMAL LOW (ref 3.5–5.0)
Alkaline Phosphatase: 92 U/L (ref 38–126)
Anion gap: 7 (ref 5–15)
BUN: 16 mg/dL (ref 6–20)
CO2: 19 mmol/L — ABNORMAL LOW (ref 22–32)
Calcium: 8.2 mg/dL — ABNORMAL LOW (ref 8.9–10.3)
Chloride: 106 mmol/L (ref 98–111)
Creatinine, Ser: 1.87 mg/dL — ABNORMAL HIGH (ref 0.61–1.24)
GFR, Estimated: 43 mL/min — ABNORMAL LOW (ref >60)
Glucose, Bld: 118 mg/dL — ABNORMAL HIGH (ref 70–99)
Potassium: 3.5 mmol/L (ref 3.5–5.1)
Sodium: 132 mmol/L — ABNORMAL LOW (ref 135–145)
Total Bilirubin: 0.8 mg/dL (ref 0.3–1.2)
Total Protein: 6.4 g/dL — ABNORMAL LOW (ref 6.5–8.1)

## 2023-01-25 LAB — IRON AND TIBC
Iron: 14 ug/dL — ABNORMAL LOW (ref 45–182)
Saturation Ratios: 8 % — ABNORMAL LOW (ref 17.9–39.5)
TIBC: 186 ug/dL — ABNORMAL LOW (ref 250–450)
UIBC: 172 ug/dL

## 2023-01-25 LAB — CBC
HCT: 36.7 % — ABNORMAL LOW (ref 39.0–52.0)
Hemoglobin: 11.9 g/dL — ABNORMAL LOW (ref 13.0–17.0)
MCH: 25.3 pg — ABNORMAL LOW (ref 26.0–34.0)
MCHC: 32.4 g/dL (ref 30.0–36.0)
MCV: 77.9 fL — ABNORMAL LOW (ref 80.0–100.0)
Platelets: 254 10*3/uL (ref 150–400)
RBC: 4.71 MIL/uL (ref 4.22–5.81)
RDW: 16 % — ABNORMAL HIGH (ref 11.5–15.5)
WBC: 20.1 10*3/uL — ABNORMAL HIGH (ref 4.0–10.5)
nRBC: 0 % (ref 0.0–0.2)

## 2023-01-25 LAB — PROCALCITONIN: Procalcitonin: 4.61 ng/mL

## 2023-01-25 LAB — VITAMIN B12: Vitamin B-12: 572 pg/mL (ref 180–914)

## 2023-01-25 LAB — FOLATE: Folate: 8.9 ng/mL (ref >5.9)

## 2023-01-25 LAB — RETICULOCYTES
Immature Retic Fract: 11.7 % (ref 2.3–15.9)
RBC.: 4.71 MIL/uL (ref 4.22–5.81)
Retic Count, Absolute: 22.6 10*3/uL (ref 19.0–186.0)
Retic Ct Pct: 0.5 % (ref 0.4–3.1)

## 2023-01-25 LAB — FERRITIN: Ferritin: 549 ng/mL — ABNORMAL HIGH (ref 24–336)

## 2023-01-25 MED ORDER — METOPROLOL SUCCINATE ER 50 MG PO TB24: 50.0000 mg | ORAL_TABLET | Freq: Every day | ORAL | Status: DC

## 2023-01-25 MED ORDER — POTASSIUM CHLORIDE CRYS ER 20 MEQ PO TBCR
40.0000 meq | EXTENDED_RELEASE_TABLET | Freq: Once | ORAL | Status: AC
  Administered 2023-01-25: 40 meq via ORAL
  Filled 2023-01-25: qty 2

## 2023-01-25 MED ORDER — METOPROLOL SUCCINATE ER 50 MG PO TB24
50.0000 mg | ORAL_TABLET | Freq: Every day | ORAL | Status: DC
  Administered 2023-01-25: 50 mg via ORAL
  Filled 2023-01-25: qty 1

## 2023-01-25 NOTE — TOC CM/SW Note (Signed)
Transition of Care [facility]) - Inpatient Brief Assessment   Patient Details  Name: Randy [name] MRN: [id number] Date of Birth: [birth year]  Transition of Care [facility]) CM/SW Contact:    [name], [name], RN Phone Number: 01/25/2023, 4:10 PM   Clinical Narrative:  Patient presented to the ED with Rt Leg Pain with Chronic Wound on Rt Second Toe. Patient has significant hx of Osteomyelitis and Amputation of Rt First Toe, A-fib on Eliquis, CKD Stage III, CVA. On IV abx, Ortho following.   From home with wife, has one son.  Currently employed, independent with care prior to admission. Does not have DME's at home.  Does not have PCP, states wife has setup an appointment with Dr. Karleen Hampshire at [facility] in [place]. Research scientist (life sciences) in [place].   No TOC needs or recommendations noted at this time.  Patient not Medically ready for discharge.  CM will continue to follow as patient progresses with care towards discharge.           Transition of Care Asessment: Insurance and Status: Insurance coverage has been reviewed Patient has primary care physician: Yes Home environment has been reviewed: Yes Prior level of function:: Independent Prior/Current Home Services: No current home services Social Determinants of Health Reivew: SDOH reviewed no interventions necessary Readmission risk has been reviewed: Yes Transition of care needs: transition of care needs identified, TOC will continue to follow

## 2023-01-25 NOTE — Plan of Care (Signed)
  Problem: Education: Goal: Understanding of disease, treatment, and recovery process will improve Outcome: Progressing   Problem: Activity: Goal: Ability to return to baseline activity level will improve Outcome: Progressing   Problem: Cardiac: Goal: Ability to maintain adequate cardiovascular perfusion will improve Outcome: Progressing Goal: Vascular access site(s) Level 0-1 will be maintained Outcome: Progressing   Problem: Health Behavior/ Discharge Planning: Goal: Ability to safely manage health related needs after discharge Outcome: Progressing   Problem: Education: Goal: Ability to describe self-care measures that may prevent or decrease complications (Diabetes Survival Skills Education) will improve Outcome: Progressing Goal: Individualized Educational Video(s) Outcome: Progressing   Problem: Coping: Goal: Ability to adjust to condition or change in health will improve Outcome: Progressing   Problem: Fluid Volume: Goal: Ability to maintain a balanced intake and output will improve Outcome: Progressing   Problem: Health Behavior/Discharge Planning: Goal: Ability to identify and utilize available resources and services will improve Outcome: Progressing Goal: Ability to manage health-related needs will improve Outcome: Progressing   Problem: Metabolic: Goal: Ability to maintain appropriate glucose levels will improve Outcome: Progressing   Problem: Nutritional: Goal: Maintenance of adequate nutrition will improve Outcome: Progressing Goal: Progress toward achieving an optimal weight will improve Outcome: Progressing   Problem: Skin Integrity: Goal: Risk for impaired skin integrity will decrease Outcome: Progressing   Problem: Tissue Perfusion: Goal: Adequacy of tissue perfusion will improve Outcome: Progressing   Problem: Education: Goal: Knowledge of General Education information will improve Description: Including pain rating scale, medication(s)/side  effects and non-pharmacologic comfort measures Outcome: Progressing   Problem: Health Behavior/Discharge Planning: Goal: Ability to manage health-related needs will improve Outcome: Progressing   Problem: Clinical Measurements: Goal: Ability to maintain clinical measurements within normal limits will improve Outcome: Progressing Goal: Will remain free from infection Outcome: Progressing Goal: Diagnostic test results will improve Outcome: Progressing Goal: Respiratory complications will improve Outcome: Progressing Goal: Cardiovascular complication will be avoided Outcome: Progressing   Problem: Activity: Goal: Risk for activity intolerance will decrease Outcome: Progressing   Problem: Nutrition: Goal: Adequate nutrition will be maintained Outcome: Progressing   Problem: Coping: Goal: Level of anxiety will decrease Outcome: Progressing   Problem: Elimination: Goal: Will not experience complications related to bowel motility Outcome: Progressing Goal: Will not experience complications related to urinary retention Outcome: Progressing   Problem: Pain Managment: Goal: General experience of comfort will improve Outcome: Progressing   Problem: Safety: Goal: Ability to remain free from injury will improve Outcome: Progressing   Problem: Skin Integrity: Goal: Risk for impaired skin integrity will decrease Outcome: Progressing   Problem: Fluid Volume: Goal: Hemodynamic stability will improve Outcome: Progressing   Problem: Clinical Measurements: Goal: Diagnostic test results will improve Outcome: Progressing Goal: Signs and symptoms of infection will decrease Outcome: Progressing   Problem: Respiratory: Goal: Ability to maintain adequate ventilation will improve Outcome: Progressing

## 2023-01-25 NOTE — Progress Notes (Signed)
[facility] PROGRESS NOTE   Randy [name] WJX:[id number] DOB: 1970-05-04 DOA: 01/21/2023  PCP: Pcp, No  Brief History: 52 y.o. 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  Denies any noticeable rash in the area.  Has chronic wound involving the right foot, and an ulcer on the right second toe.  Reports associated rigors.  No cough/cold symptoms, chest pain, abdominal pain, nausea, vomiting, diarrhea, dysuria.  No injury to the area of the right thigh.  No pain localizing to the knee.  No indwelling hardware.    Consultants:  Dr. Lajoyce Corners   Procedures: None yet    Subjective/Interval History: Patient mentioned that he feels well.  Pain in the right leg has improved.  Redness persists.  Denies any chest pain shortness of breath.  Heart rate remains elevated.     Assessment/Plan:  Right lower extremity cellulitis/sepsis, present on admission/lactic acidosis  Presented with fever leukocytosis tachycardia and tachypnea.  Initially no clear source of infection was identified.  Subsequently it became apparent that he had cellulitic changes in his right lower extremity. He was noted to have lymphadenopathy in the right inguinal area.  CT of the right thigh was done which showed only a lymphadenopathy without any abscess. Right foot did not appear to be overtly infected based on clinical exam.  MRI was done which shows multiple findings raising concern for osteomyelitis.  Seen by Dr. Lajoyce Corners who does not feel the patient clinically has osteomyelitis.   Subsequently developed erythema of the right lower leg.  CT was done overnight and report is pending. MRSA PCR was noted to be negative.  Vancomycin was discontinued.  Currently on cefepime and  metronidazole.  Anticipate transition to oral antibiotics in the next 24 hours.  WBC remains elevated.  Fevers have subsided. Procalcitonin level appears to be improving.  Down to 4.6. Doppler studies are negative for DVT.  Lactic acid level had improved.    Chronic systolic CHF Last echocardiogram done earlier this month showed improvement in EF to 50 to 55%.  Furosemide is on hold.  His other goal-directed medical treatments are also on hold currently due to his acute issues.  Acute on chronic kidney disease stage IIIb Baseline creatinine is near 2.  Patient experienced acute kidney injury due to sepsis and possible mild hypovolemia.  Creatinine peaked at 2.51.  Vancomycin was discontinued.  Patient was given IV fluids.  Started urinating yesterday.  IV fluids were discontinued due to edema.  Renal function is now improving.  He has good urine output.  Creatinine is down to 1.87 today.    Hyponatremia and hypokalemia Sodium level is stable.  Replace potassium.      Paroxysmal atrial fibrillation/flutter He is status post ablation for atrial fibrillation.  Subsequently noted to be in atrial flutter. He is on metoprolol and apixaban. Heart rate was initially poorly controlled due to high fevers/sepsis.  However sepsis physiology has improved but remains tachycardic. EKG continues to show atrial flutter.  Will request cardiology to evaluate.  History of CVA Statin and anticoagulation being continued.  Microcytic anemia Likely due to acute illness.  Anemia panel reviewed.  No clear-cut deficiencies identified.    Diabetes mellitus type 2 in obese [facility].  SSI.  CBGs are reasonably well-controlled.  Morbid obesity Estimated  body mass index is 42.31 kg/m as calculated from the following:   Height as of this encounter: 6\' 8"  (2.032 m).   Weight as of this encounter: 174.7 kg.  DVT Prophylaxis: On apixaban Code Status: Full code Family Communication: Discussed with  patient Disposition Plan: Return home when improved  Status is: Inpatient Remains inpatient appropriate because: Sepsis, right lower extremity infection      Medications: Scheduled:  apixaban  5 mg Oral BID   atorvastatin  20 mg Oral QHS   insulin aspart  0-6 Units Subcutaneous TID WC   isosorbide-hydrALAZINE  2 tablet Oral TID   metoprolol succinate  100 mg Oral QHS   Continuous:  ceFEPime (MAXIPIME) IV 2 g (01/25/23 0537)   metronidazole 500 mg (01/25/23 0241)   VZD:GLOVFIEPPIRJJ, HYDROmorphone (DILAUDID) injection, melatonin, metoprolol tartrate, mouth rinse, oxyCODONE  Antibiotics: Anti-infectives (From admission, onward)    Start     Dose/Rate Route Frequency Ordered Stop   01/22/23 1400  metroNIDAZOLE (FLAGYL) IVPB 500 mg        500 mg 100 mL/hr over 60 Minutes Intravenous Every 12 hours 01/22/23 1223     01/22/23 0900  vancomycin (VANCOCIN) IVPB 1000 mg/200 mL premix  Status:  Discontinued        1,000 mg 200 mL/hr over 60 Minutes Intravenous Every 12 hours 01/22/23 0006 01/24/23 0827   01/22/23 0500  ceFEPIme (MAXIPIME) 2 g in sodium chloride 0.9 % 100 mL IVPB        2 g 200 mL/hr over 30 Minutes Intravenous Every 8 hours 01/21/23 2345     01/21/23 2115  ceFEPIme (MAXIPIME) 2 g in sodium chloride 0.9 % 100 mL IVPB        2 g 200 mL/hr over 30 Minutes Intravenous  Once 01/21/23 2111 01/21/23 2140   01/21/23 2115  vancomycin (VANCOCIN) 2,500 mg in sodium chloride 0.9 % 500 mL IVPB        2,500 mg 262.5 mL/hr over 120 Minutes Intravenous  Once 01/21/23 2111 01/21/23 2352       Objective:  Vital Signs  Vitals:   01/24/23 1644 01/24/23 2011 01/25/23 0532 01/25/23 0753  BP: (!) 154/99 (!) 138/111 (!) 155/108 (!) 169/122  Pulse: (!) 126 (!) 123 (!) 125 (!) 126  Resp: 20 18 16 19   Temp: 98.8 F (37.1 C) 99.1 F (37.3 C) 98.4 F (36.9 C) 98.5 F (36.9 C)  TempSrc: Oral Oral Oral Oral  SpO2: 96% 98% 100% 95%  Weight:      Height:        Intake/Output  Summary (Last 24 hours) at 01/25/2023 0951 Last data filed at 01/25/2023 8841 Gross per 24 hour  Intake 120 ml  Output 1100 ml  Net -980 ml   Filed Weights   01/21/23 1317 01/22/23 0020  Weight: (!) 170.3 kg (!) 174.7 kg    General appearance: Awake alert.  In no distress Resp: Clear to auscultation bilaterally.  Normal effort Cardio: S1-S2 is normal regular.  No S3-S4.  No rubs murmurs or bruit GI: Abdomen is soft.  Nontender nondistended.  Bowel sounds are present normal.  No masses organomegaly Extremities: Right lower extremity continues to be erythematous.  Swelling is noted.  Less warm to touch compared to yesterday.  Lab Results:  Data Reviewed: I have personally reviewed following labs and reports of the imaging studies  CBC: Recent Labs  Lab 01/21/23 1323 01/22/23 0053 01/23/23 0801 01/24/23 0543 01/25/23 0632  WBC 15.1* 20.6* 20.8* 21.1*  20.1*  NEUTROABS 12.9*  --   --   --   --   HGB 13.3 12.7* 11.9* 11.9* 11.9*  HCT 42.4 40.0 37.7* 36.8* 36.7*  MCV 81.1 79.5* 78.7* 78.5* 77.9*  PLT 216 212 197 211 254    Basic Metabolic Panel: Recent Labs  Lab 01/21/23 1323 01/22/23 1200 01/23/23 0801 01/24/23 0543 01/25/23 0632  NA 133* 133* 130* 132* 132*  K 3.4* 3.4* 3.5 3.4* 3.5  CL 103 100 101 100 106  CO2 20* 21* 21* 20* 19*  GLUCOSE 170* 181* 134* 121* 118*  BUN 17 20 22* 20 16  CREATININE 2.06* 2.38* 2.51* 2.14* 1.87*  CALCIUM 8.3* 8.1* 8.1* 8.3* 8.2*  MG  --  2.1  --   --   --   PHOS  --  2.9  --   --   --     GFR: Estimated Creatinine Clearance: 85.2 mL/min (A) (by C-G formula based on SCr of 1.87 mg/dL (H)).  CBG: Recent Labs  Lab 01/24/23 0727 01/24/23 1112 01/24/23 1646 01/24/23 2015 01/25/23 0723  GLUCAP 115* 148* 136* 183* 139*     Recent Results (from the past 240 hour(s))  Culture, blood (routine x 2)     Status: None (Preliminary result)   Collection Time: 01/21/23  2:12 PM   Specimen: BLOOD  Result Value Ref Range Status    Specimen Description BLOOD SITE NOT SPECIFIED  Final   Special Requests   Final    BOTTLES DRAWN AEROBIC AND ANAEROBIC Blood Culture adequate volume   Culture   Final    NO GROWTH 4 DAYS Performed at [facility], [address], [place], [place] [address]    Report Status PENDING  Incomplete  Culture, blood (routine x 2)     Status: None (Preliminary result)   Collection Time: 01/21/23  2:17 PM   Specimen: BLOOD  Result Value Ref Range Status   Specimen Description BLOOD SITE NOT SPECIFIED  Final   Special Requests   Final    BOTTLES DRAWN AEROBIC ONLY Blood Culture adequate volume   Culture   Final    NO GROWTH 4 DAYS Performed at [facility], [address], [place], [place] [address]    Report Status PENDING  Incomplete  MRSA Next Gen by PCR, Nasal     Status: None   Collection Time: 01/23/23  6:16 PM   Specimen: Nasal Mucosa; Nasal Swab  Result Value Ref Range Status   MRSA by PCR Next Gen NOT DETECTED NOT DETECTED Final    Comment: (NOTE) The GeneXpert MRSA Assay (FDA approved for NASAL specimens only), is one component of a comprehensive MRSA colonization surveillance program. It is not intended to diagnose MRSA infection nor to guide or monitor treatment for MRSA infections. Test performance is not FDA approved in patients less than 62 years old. Performed at [facility], [address], [place], [place] [address]       Radiology Studies: No results found.     LOS: 4 days   [employer] on [email or web address]  01/25/2023, 9:51 AM
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
## 2023-01-25 NOTE — Telephone Encounter (Signed)
Patient called from the hospital and wanted to know if Denny Peon can come to the hospital to see him and see what she thinks. CB#[phone]

## 2023-01-26 DIAGNOSIS — L03115 Cellulitis of right lower limb: Secondary | ICD-10-CM | POA: Diagnosis not present

## 2023-01-26 DIAGNOSIS — E871 Hypo-osmolality and hyponatremia: Secondary | ICD-10-CM | POA: Diagnosis not present

## 2023-01-26 DIAGNOSIS — A419 Sepsis, unspecified organism: Secondary | ICD-10-CM | POA: Diagnosis not present

## 2023-01-26 DIAGNOSIS — I483 Typical atrial flutter: Secondary | ICD-10-CM | POA: Diagnosis not present

## 2023-01-26 DIAGNOSIS — I4892 Unspecified atrial flutter: Secondary | ICD-10-CM | POA: Diagnosis not present

## 2023-01-26 LAB — COMPREHENSIVE METABOLIC PANEL
ALT: 64 U/L — ABNORMAL HIGH (ref 0–44)
AST: 43 U/L — ABNORMAL HIGH (ref 15–41)
Albumin: 2.4 g/dL — ABNORMAL LOW (ref 3.5–5.0)
Alkaline Phosphatase: 100 U/L (ref 38–126)
Anion gap: 7 (ref 5–15)
BUN: 15 mg/dL (ref 6–20)
CO2: 20 mmol/L — ABNORMAL LOW (ref 22–32)
Calcium: 8.5 mg/dL — ABNORMAL LOW (ref 8.9–10.3)
Chloride: 105 mmol/L (ref 98–111)
Creatinine, Ser: 1.85 mg/dL — ABNORMAL HIGH (ref 0.61–1.24)
GFR, Estimated: 44 mL/min — ABNORMAL LOW (ref >60)
Glucose, Bld: 111 mg/dL — ABNORMAL HIGH (ref 70–99)
Potassium: 4.2 mmol/L (ref 3.5–5.1)
Sodium: 132 mmol/L — ABNORMAL LOW (ref 135–145)
Total Bilirubin: 0.9 mg/dL (ref 0.3–1.2)
Total Protein: 7.1 g/dL (ref 6.5–8.1)

## 2023-01-26 LAB — CBC
HCT: 37 % — ABNORMAL LOW (ref 39.0–52.0)
Hemoglobin: 12.1 g/dL — ABNORMAL LOW (ref 13.0–17.0)
MCH: 25.3 pg — ABNORMAL LOW (ref 26.0–34.0)
MCHC: 32.7 g/dL (ref 30.0–36.0)
MCV: 77.2 fL — ABNORMAL LOW (ref 80.0–100.0)
Platelets: 292 10*3/uL (ref 150–400)
RBC: 4.79 MIL/uL (ref 4.22–5.81)
RDW: 16.3 % — ABNORMAL HIGH (ref 11.5–15.5)
WBC: 22.9 10*3/uL — ABNORMAL HIGH (ref 4.0–10.5)
nRBC: 0 % (ref 0.0–0.2)

## 2023-01-26 LAB — HEPATITIS PANEL, ACUTE
HCV Ab: NONREACTIVE
Hep A IgM: NONREACTIVE
Hep B C IgM: NONREACTIVE
Hepatitis B Surface Ag: NONREACTIVE

## 2023-01-26 LAB — CULTURE, BLOOD (ROUTINE X 2)
Culture: NO GROWTH
Culture: NO GROWTH
Special Requests: ADEQUATE
Special Requests: ADEQUATE

## 2023-01-26 LAB — GLUCOSE, CAPILLARY
Glucose-Capillary: 135 mg/dL — ABNORMAL HIGH (ref 70–99)
Glucose-Capillary: 124 mg/dL — ABNORMAL HIGH (ref 70–99)
Glucose-Capillary: 143 mg/dL — ABNORMAL HIGH (ref 70–99)
Glucose-Capillary: 143 mg/dL — ABNORMAL HIGH (ref 70–99)

## 2023-01-26 MED ORDER — METOPROLOL SUCCINATE ER 100 MG PO TB24
100.0000 mg | ORAL_TABLET | Freq: Two times a day (BID) | ORAL | Status: DC
  Administered 2023-01-26 – 2023-01-29 (×7): 100 mg via ORAL
  Filled 2023-01-26 (×7): qty 1

## 2023-01-26 MED ORDER — DOXYCYCLINE HYCLATE 100 MG PO TABS
100.0000 mg | ORAL_TABLET | Freq: Two times a day (BID) | ORAL | Status: DC
  Administered 2023-01-26 – 2023-01-29 (×7): 100 mg via ORAL
  Filled 2023-01-26 (×7): qty 1

## 2023-01-26 MED ORDER — CEFADROXIL 500 MG PO CAPS
500.0000 mg | ORAL_CAPSULE | Freq: Two times a day (BID) | ORAL | Status: DC
Start: 2023-01-26 — End: 2023-01-29
  Administered 2023-01-26 – 2023-01-29 (×7): 500 mg via ORAL
  Filled 2023-01-26 (×7): qty 1

## 2023-01-26 MED ORDER — PANTOPRAZOLE SODIUM 40 MG PO TBEC
40.0000 mg | DELAYED_RELEASE_TABLET | Freq: Every day | ORAL | Status: DC
Start: 2023-01-26 — End: 2023-01-27
  Administered 2023-01-26: 40 mg via ORAL
  Filled 2023-01-26: qty 1

## 2023-01-26 MED ORDER — GABAPENTIN 100 MG PO CAPS
100.0000 mg | ORAL_CAPSULE | Freq: Three times a day (TID) | ORAL | Status: AC
  Administered 2023-01-26 – 2023-01-27 (×6): 100 mg via ORAL
  Filled 2023-01-26 (×6): qty 1

## 2023-01-26 NOTE — Progress Notes (Addendum)
[facility] PROGRESS NOTE   Randy [name] JYN:[id number] DOB: [birth year] DOA: 01/21/2023  PCP: Pcp, No  Brief History: 52 y.o. 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  Denies any noticeable rash in the area.  Has chronic wound involving the right foot, and an ulcer on the right second toe.  Reports associated rigors.  No cough/cold symptoms, chest pain, abdominal pain, nausea, vomiting, diarrhea, dysuria.  No injury to the area of the right thigh.  No pain localizing to the knee.  No indwelling hardware.    Consultants:  Dr. Lajoyce Corners   Procedures: None yet    Subjective/Interval History: Patient mentioned that the pain in his right leg is improving.  Redness persists.  Continues to have hiccups.  Heart rate remains elevated but he denies any chest pain or shortness of breath.    Assessment/Plan:  Right lower extremity cellulitis/sepsis, present on admission/lactic acidosis  Presented with fever leukocytosis tachycardia and tachypnea.  Initially no clear source of infection was identified.  Subsequently it became apparent that he had cellulitic changes in his right lower extremity. He was noted to have lymphadenopathy in the right inguinal area.  CT of the right thigh was done which showed only a lymphadenopathy without any abscess. Right foot did not appear to be overtly infected based on clinical exam.  MRI was done which shows multiple findings raising concern for osteomyelitis.  Seen by Dr. Lajoyce Corners who does not feel the patient clinically has osteomyelitis.   Subsequently developed erythema of the right lower leg.  CT of the right lower leg also suggested cellulitic changes without any abscess. Patient was initially placed on vancomycin  cefepime and metronidazole.  Vancomycin was discontinued due to worsening renal function and negative MRSA PCR. Procalcitonin level noted to be improving.  He is afebrile.  WBC remains elevated.  Continues to have significant erythema of the right lower extremity but seems to be slightly better.  Should be reasonable to transition him to oral antibiotics today.  Will change him to cefadroxil and doxycycline.  WBC may take longer time to improve.  No further imaging studies of the lower extremity. Seen by Dr. Lajoyce Corners this morning as well.  Hopefully he will improve over the next several days. Doppler studies are negative for DVT.  Lactic acid level had improved.    Chronic systolic CHF Last echocardiogram done earlier this month showed improvement in EF to 50 to 55%.  Furosemide is on hold.  His other goal-directed medical treatments are also on hold currently due to his acute issues.  Acute on chronic kidney disease stage IIIb Baseline creatinine is near 2.  Patient experienced acute kidney injury due to sepsis and possible mild hypovolemia.  Creatinine peaked at 2.51.  Vancomycin was discontinued.  Patient was given IV fluids.  Renal function improved.  He started urinating.  IV fluids were discontinued due to edema. Renal function is stable.  Has good urine output.  Will continue auto diuresis.  Hold off on diuretics for now.     Hyponatremia and hypokalemia Sodium level is stable.  Potassium was replaced  Paroxysmal atrial fibrillation/flutter He is status post ablation for atrial fibrillation.  Subsequently noted to be in atrial flutter. He is on metoprolol and apixaban. Heart rate was initially poorly controlled due  to high fevers/sepsis.  However sepsis physiology has improved but remained tachycardic. Cardiology consulted.  Defer management to them.  They have increased the dose of metoprolol.  Mild transaminitis Elevated AST ALT likely due to sepsis.  Gradually improving.  Hepatitis panel  unremarkable so far.  HCV antibody is pending.  History of CVA Statin and anticoagulation being continued.  Microcytic anemia Likely due to acute illness.  Anemia panel reviewed.  No clear-cut deficiencies identified.    Diabetes mellitus type 2 in obese Holding [facility].  SSI.  CBGs are reasonably well-controlled.  Hiccups No improvement in hiccups in the last 48 hours.  Will place him on PPI and give gabapentin for 2 days.  Morbid obesity Estimated body mass index is 42.31 kg/m as calculated from the following:   Height as of this encounter: 6\' 8"  (2.032 m).   Weight as of this encounter: 174.7 kg.  DVT Prophylaxis: On apixaban Code Status: Full code Family Communication: Discussed with patient Disposition Plan: Return home when improved.  Hopefully in the next 24 to 48 hours.  Status is: Inpatient Remains inpatient appropriate because: Sepsis, right lower extremity infection      Medications: Scheduled:  apixaban  5 mg Oral BID   atorvastatin  20 mg Oral QHS   cefadroxil  500 mg Oral BID   doxycycline  100 mg Oral Q12H   gabapentin  100 mg Oral TID   insulin aspart  0-6 Units Subcutaneous TID WC   isosorbide-hydrALAZINE  2 tablet Oral TID   metoprolol succinate  100 mg Oral BID   pantoprazole  40 mg Oral Q1200   Continuous:   [id number], HYDROmorphone (DILAUDID) injection, melatonin, metoprolol tartrate, mouth rinse, oxyCODONE  Antibiotics: Anti-infectives (From admission, onward)    Start     Dose/Rate Route Frequency Ordered Stop   01/26/23 1000  cefadroxil (DURICEF) capsule 500 mg        500 mg Oral 2 times daily 01/26/23 0858     01/26/23 1000  doxycycline (VIBRA-TABS) tablet 100 mg        100 mg Oral Every 12 hours 01/26/23 0858     01/22/23 1400  metroNIDAZOLE (FLAGYL) IVPB 500 mg  Status:  Discontinued        500 mg 100 mL/hr over 60 Minutes Intravenous Every 12 hours 01/22/23 1223 01/26/23 0858   01/22/23 0900  vancomycin  (VANCOCIN) IVPB 1000 mg/200 mL premix  Status:  Discontinued        1,000 mg 200 mL/hr over 60 Minutes Intravenous Every 12 hours 01/22/23 0006 01/24/23 0827   01/22/23 0500  ceFEPIme (MAXIPIME) 2 g in sodium chloride 0.9 % 100 mL IVPB  Status:  Discontinued        2 g 200 mL/hr over 30 Minutes Intravenous Every 8 hours 01/21/23 2345 01/26/23 0858   01/21/23 2115  ceFEPIme (MAXIPIME) 2 g in sodium chloride 0.9 % 100 mL IVPB        2 g 200 mL/hr over 30 Minutes Intravenous  Once 01/21/23 2111 01/21/23 2140   01/21/23 2115  vancomycin (VANCOCIN) 2,500 mg in sodium chloride 0.9 % 500 mL IVPB        2,500 mg 262.5 mL/hr over 120 Minutes Intravenous  Once 01/21/23 2111 01/21/23 2352       Objective:  Vital Signs  Vitals:   01/25/23 2104 01/25/23 2147 01/26/23 0446 01/26/23 0750  BP: (!) 175/130 (!) 158/124 (!) 148/116 (!) 156/122  Pulse: (!) 121 (!) 120 (!) 125 Marland Kitchen)  125  Resp:    18  Temp: 98.5 F (36.9 C)  98.4 F (36.9 C) 98.6 F (37 C)  TempSrc:    Oral  SpO2: 97%  98% 97%  Weight:      Height:        Intake/Output Summary (Last 24 hours) at 01/26/2023 1010 Last data filed at 01/26/2023 0800 Gross per 24 hour  Intake 480 ml  Output 3150 ml  Net -2670 ml   Filed Weights   01/21/23 1317 01/22/23 0020  Weight: (!) 170.3 kg (!) 174.7 kg    General appearance: Awake alert.  In no distress Resp: Clear to auscultation bilaterally.  Normal effort Cardio: S1-S2 is normal regular.  No S3-S4.  No rubs murmurs or bruit GI: Abdomen is soft.  Nontender nondistended.  Bowel sounds are present normal.  No masses organomegaly Extremities: Improving erythema right lower extremity but still significantly swollen.  Less warm to touch compared to last few days.   Neurologic: Alert and oriented x3.  No focal neurological deficits.    Lab Results:  Data Reviewed: I have personally reviewed following labs and reports of the imaging studies  CBC: Recent Labs  Lab 01/21/23 1323  01/22/23 0053 01/23/23 0801 01/24/23 0543 01/25/23 0632 01/26/23 0458  WBC 15.1* 20.6* 20.8* 21.1* 20.1* 22.9*  NEUTROABS 12.9*  --   --   --   --   --   HGB 13.3 12.7* 11.9* 11.9* 11.9* 12.1*  HCT 42.4 40.0 37.7* 36.8* 36.7* 37.0*  MCV 81.1 79.5* 78.7* 78.5* 77.9* 77.2*  PLT 216 212 197 211 254 292    Basic Metabolic Panel: Recent Labs  Lab 01/22/23 1200 01/23/23 0801 01/24/23 0543 01/25/23 0632 01/26/23 0458  NA 133* 130* 132* 132* 132*  K 3.4* 3.5 3.4* 3.5 4.2  CL 100 101 100 106 105  CO2 21* 21* 20* 19* 20*  GLUCOSE 181* 134* 121* 118* 111*  BUN 20 22* 20 16 15   CREATININE 2.38* 2.51* 2.14* 1.87* 1.85*  CALCIUM 8.1* 8.1* 8.3* 8.2* 8.5*  MG 2.1  --   --   --   --   PHOS 2.9  --   --   --   --     GFR: Estimated Creatinine Clearance: 86.1 mL/min (A) (by C-G formula based on SCr of 1.85 mg/dL (H)).  CBG: Recent Labs  Lab 01/25/23 0723 01/25/23 1118 01/25/23 1621 01/25/23 2104 01/26/23 0711  GLUCAP 139* 138* 123* 132* 135*     Recent Results (from the past 240 hour(s))  Culture, blood (routine x 2)     Status: None   Collection Time: 01/21/23  2:12 PM   Specimen: BLOOD  Result Value Ref Range Status   Specimen Description BLOOD SITE NOT SPECIFIED  Final   Special Requests   Final    BOTTLES DRAWN AEROBIC AND ANAEROBIC Blood Culture adequate volume   Culture   Final    NO GROWTH 5 DAYS Performed at [facility], [address] [place], [place] [address]    Report Status 01/26/2023 FINAL  Final  Culture, blood (routine x 2)     Status: None   Collection Time: 01/21/23  2:17 PM   Specimen: BLOOD  Result Value Ref Range Status   Specimen Description BLOOD SITE NOT SPECIFIED  Final   Special Requests   Final    BOTTLES DRAWN AEROBIC ONLY Blood Culture adequate volume   Culture   Final    NO GROWTH 5  DAYS Performed at [facility], [address] [place], [place] [address]    Report Status 01/26/2023 FINAL  Final  MRSA Next Gen by PCR,  Nasal     Status: None   Collection Time: 01/23/23  6:16 PM   Specimen: Nasal Mucosa; Nasal Swab  Result Value Ref Range Status   MRSA by PCR Next Gen NOT DETECTED NOT DETECTED Final    Comment: (NOTE) The GeneXpert MRSA Assay (FDA approved for NASAL specimens only), is one component of a comprehensive MRSA colonization surveillance program. It is not intended to diagnose MRSA infection nor to guide or monitor treatment for MRSA infections. Test performance is not FDA approved in patients less than 60 years old. Performed at [facility], [address] [place], [place] [address]       Radiology Studies: CT EXTREMITY LOWER RIGHT WO CONTRAST  Result Date: 01/25/2023 CLINICAL DATA:  Right lower leg swelling. Soft tissue infection suspected. EXAM: CT OF THE LOWER RIGHT EXTREMITY WITHOUT CONTRAST TECHNIQUE: Multidetector CT imaging of the right lower leg was performed according to the standard protocol. RADIATION DOSE REDUCTION: This exam was performed according to the departmental dose-optimization program which includes automated exposure control, adjustment of the mA and/or kV according to patient size and/or use of iterative reconstruction technique. COMPARISON:  CT right thigh 01/22/2023. MRI of the right ankle 01/21/2023 and MRI of the right lower leg 09/15/2021. FINDINGS: Bones/Joint/Cartilage No evidence of acute fracture, dislocation or osteonecrosis. No evidence of osteomyelitis. There is stable mild subchondral cyst formation posteriorly in the lateral tibial plateau. No significant knee joint effusion. No significant ankle joint effusion or arthropathy. The talar dome is intact. Ligaments Suboptimally assessed by CT. Muscles and Tendons No focal muscular atrophy or intramuscular fluid collection identified. The patellar tendon is intact. The visualized ankle tendons appear intact, although their distal insertions are not imaged. Soft tissues Generalized subcutaneous edema  throughout the right lower leg. Edema within the distal thigh has increased compared with the recent CT. The edema appears greatest distally. No focal fluid collection, foreign body or soft tissue emphysema identified. Moderate atherosclerosis within the runoff vessels. IMPRESSION: 1. Nonspecific subcutaneous edema throughout the right lower leg, increased from recent CT, compatible with cellulitis. No focal fluid collection, foreign body or soft tissue emphysema identified. 2. No acute osseous findings. No evidence of osteomyelitis or septic arthritis. 3. Moderate atherosclerosis. Electronically Signed   By: Carey Bullocks M.D.   On: 01/25/2023 14:21       LOS: 5 days   Randy [name] Randy [name]  [facility] Pager on [email or web address]  01/26/2023, 10:10 AM

## 2023-01-26 NOTE — Progress Notes (Addendum)
Patient Name: Randy [name] Date of Encounter: 01/26/2023  Primary Cardiologist: None Electrophysiologist: Natassia Guthridge Jorja Loa, MD  Interval Summary   The patient is doing well today.  Reports RLE remains swollen. Difficult to sleep while in hospital.   At this time, the patient denies chest pain, shortness of breath, or any new concerns.  Vital Signs    Vitals:   01/25/23 2104 01/25/23 2147 01/26/23 0446 01/26/23 0750  BP: (!) 175/130 (!) 158/124 (!) 148/116 (!) 156/122  Pulse: (!) 121 (!) 120 (!) 125 (!) 125  Resp:    18  Temp: 98.5 F (36.9 C)  98.4 F (36.9 C) 98.6 F (37 C)  TempSrc:    Oral  SpO2: 97%  98% 97%  Weight:      Height:        Intake/Output Summary (Last 24 hours) at 01/26/2023 0836 Last data filed at 01/26/2023 0520 Gross per 24 hour  Intake 240 ml  Output 2550 ml  Net -2310 ml   Filed Weights   01/21/23 1317 01/22/23 0020  Weight: (!) 170.3 kg (!) 174.7 kg    Physical Exam    GEN- The patient is well appearing, alert and oriented x 3 today.   Lungs- Clear to ausculation bilaterally, normal work of breathing Cardiac- Regular rate and rhythm, no murmurs, rubs or gallops GI- soft, NT, ND, + BS Extremities- no clubbing or cyanosis. No edema  Telemetry    AFL 120's (personally reviewed)  Hospital Course    Randy [name] is a 52 y.o. male admitted 01/21/23 for RLE swelling.  PMH of longstanding thyroid issues previously on methimazole and prednisone, AF/AFL s/p ablation with PVI with box lesion, subsequently had AFL with RVR s/p DCCV with ERAF with coarse AF vs AFL in 11/2022. Restarted on Toprol for rate control. Seen as outpt 9/6 with plan for AFL ablation and re-look PVI.  He presented 01/21/23 with sepsis secondary to RLE cellulitis and R inguinal LAN. CT of R thigh showed LAN and no overt abscess.  MRI showed concern for osteomyelitis. Evaluated by Dr. Lajoyce Corners who does not feel the pt has osteomyelitis.  WBC 22k 10/1, on cefepime + flagyl. Remains  tachycardic. Per [facility], leg does look improved overall.  Fever curve defervesced.   Assessment & Plan    Persistent Atrial Fibrillation  Atrial Flutter, Typical Appearing  S/p PVI ablation 09/2022 with box lesion.  -Toprol 100 mg BID (changed this admission)  -would like to see his HR / BP trend on 100 mg BID Toprol  -tele monitoring  -anticipate HR Randy [name] improve as sepsis physiology resolves  Secondary Hypercoagulable State  -eliquis 5mg  BID   Sepsis secondary to Cellulitis  -TRH planning on transition to oral abx   Chronic Systolic CHF  EF on TEE 09/2022 25-30%, felt to be tachy-mediated  -follow I/O's  -per primary   Hypothyroidism  -per primary   DM II  Hgb A1c 5.8% -per primary   For questions or updates, please contact [facility] Please consult [email or web address] for contact info under Cardiology/STEMI.  Signed, Randy Brim, MSN, APRN, NP-C, AGACNP-BC [name] - Electrophysiology  01/26/2023, 8:36 AM  I have seen and examined this patient with Randy [name].  Agree with above, note added to reflect my findings.  Patient without acute cardiac complaints.  Continues to have swollen right lower extremity.  GEN: Well nourished, well developed, in no acute distress  HEENT: normal  Neck: no JVD, carotid bruits, or masses Cardiac: Tachycardic; no murmurs,  rubs, or gallops,no edema  Respiratory:  clear to auscultation bilaterally, normal work of breathing GI: soft, nontender, nondistended, + BS MS: no deformity or atrophy  Skin: warm and dry Neuro:  Strength and sensation are intact Psych: euthymic mood, full affect   Persistent atrial fibrillation/flutter: Post ablation, but has had more frequent episodes.  He Randy [name] need repeat catheter ablation.  Currently on Toprol-XL 100 mg twice daily.  Randy [name] continue current rate control. Second hypercoagulable state: Currently on Eliquis Sepsis: Secondary to cellulitis.  Antibiotics per primary team Chronic systolic  heart failure: No obvious volume overload.  EF reduced on TEE thought tachycardia mediated.  Randy [name] M. Kacie Huxtable MD 01/26/2023 4:26 PM

## 2023-01-26 NOTE — Plan of Care (Signed)
  Problem: Education: Goal: Understanding of disease, treatment, and recovery process will improve Outcome: Progressing   Problem: Activity: Goal: Ability to return to baseline activity level will improve Outcome: Progressing   Problem: Cardiac: Goal: Ability to maintain adequate cardiovascular perfusion will improve Outcome: Progressing Goal: Vascular access site(s) Level 0-1 will be maintained Outcome: Progressing   Problem: Health Behavior/ Discharge Planning: Goal: Ability to safely manage health related needs after discharge Outcome: Progressing   Problem: Education: Goal: Ability to describe self-care measures that may prevent or decrease complications (Diabetes Survival Skills Education) will improve Outcome: Progressing Goal: Individualized Educational Video(s) Outcome: Progressing   Problem: Coping: Goal: Ability to adjust to condition or change in health will improve Outcome: Progressing   Problem: Fluid Volume: Goal: Ability to maintain a balanced intake and output will improve Outcome: Progressing   Problem: Health Behavior/Discharge Planning: Goal: Ability to identify and utilize available resources and services will improve Outcome: Progressing Goal: Ability to manage health-related needs will improve Outcome: Progressing   Problem: Metabolic: Goal: Ability to maintain appropriate glucose levels will improve Outcome: Progressing   Problem: Nutritional: Goal: Maintenance of adequate nutrition will improve Outcome: Progressing Goal: Progress toward achieving an optimal weight will improve Outcome: Progressing   Problem: Skin Integrity: Goal: Risk for impaired skin integrity will decrease Outcome: Progressing   Problem: Tissue Perfusion: Goal: Adequacy of tissue perfusion will improve Outcome: Progressing   Problem: Education: Goal: Knowledge of General Education information will improve Description: Including pain rating scale, medication(s)/side  effects and non-pharmacologic comfort measures Outcome: Progressing   Problem: Health Behavior/Discharge Planning: Goal: Ability to manage health-related needs will improve Outcome: Progressing   Problem: Clinical Measurements: Goal: Ability to maintain clinical measurements within normal limits will improve Outcome: Progressing Goal: Will remain free from infection Outcome: Progressing Goal: Diagnostic test results will improve Outcome: Progressing Goal: Respiratory complications will improve Outcome: Progressing Goal: Cardiovascular complication will be avoided Outcome: Progressing   Problem: Activity: Goal: Risk for activity intolerance will decrease Outcome: Progressing   Problem: Nutrition: Goal: Adequate nutrition will be maintained Outcome: Progressing   Problem: Coping: Goal: Level of anxiety will decrease Outcome: Progressing   Problem: Elimination: Goal: Will not experience complications related to bowel motility Outcome: Progressing Goal: Will not experience complications related to urinary retention Outcome: Progressing   Problem: Pain Managment: Goal: General experience of comfort will improve Outcome: Progressing   Problem: Safety: Goal: Ability to remain free from injury will improve Outcome: Progressing   Problem: Skin Integrity: Goal: Risk for impaired skin integrity will decrease Outcome: Progressing   Problem: Fluid Volume: Goal: Hemodynamic stability will improve Outcome: Progressing   Problem: Clinical Measurements: Goal: Diagnostic test results will improve Outcome: Progressing Goal: Signs and symptoms of infection will decrease Outcome: Progressing   Problem: Respiratory: Goal: Ability to maintain adequate ventilation will improve Outcome: Progressing

## 2023-01-26 NOTE — Progress Notes (Signed)
Orthopedic Tech Progress Note Patient Details:  Randy [name] [birth year] [id number]  Patient ID: [name], male   DOB: June 05, 1970, 52 y.o.   MRN: [id number] The RN called me and asked about the compression stockings, I told them I would call back once I spoke with first shift to confirm they were called in. After speaking with first shift they said the stockings were called in. I called the RN back to let them know they should be coming tomorrow. Trinna Post 01/26/2023, 7:51 PM

## 2023-01-26 NOTE — Progress Notes (Signed)
Patient ID: Randy [name], male   DOB: 06-Sep-1970, 52 y.o.   MRN: [id number] Patient seen in follow-up for the right lower extremity.  Patient was sleeping with his leg off the bed dependent.  Patient still has warmth and swelling in the right leg there is now some wrinkling of the skin.  The thigh is no longer tender to palpation.  There is no focal area of fluctuance or tenderness.  Review of the new CT scan of the right leg and right thigh shows no isolated abscess.  I will place an order for compression sock to see if this can help decrease his edema.

## 2023-01-27 DIAGNOSIS — A419 Sepsis, unspecified organism: Secondary | ICD-10-CM | POA: Diagnosis not present

## 2023-01-27 DIAGNOSIS — I483 Typical atrial flutter: Secondary | ICD-10-CM | POA: Diagnosis not present

## 2023-01-27 LAB — GLUCOSE, CAPILLARY
Glucose-Capillary: 106 mg/dL — ABNORMAL HIGH (ref 70–99)
Glucose-Capillary: 123 mg/dL — ABNORMAL HIGH (ref 70–99)
Glucose-Capillary: 148 mg/dL — ABNORMAL HIGH (ref 70–99)
Glucose-Capillary: 129 mg/dL — ABNORMAL HIGH (ref 70–99)

## 2023-01-27 LAB — COMPREHENSIVE METABOLIC PANEL
ALT: 53 U/L — ABNORMAL HIGH (ref 0–44)
AST: 32 U/L (ref 15–41)
Albumin: 2.3 g/dL — ABNORMAL LOW (ref 3.5–5.0)
Alkaline Phosphatase: 102 U/L (ref 38–126)
Anion gap: 7 (ref 5–15)
BUN: 15 mg/dL (ref 6–20)
CO2: 22 mmol/L (ref 22–32)
Calcium: 8.8 mg/dL — ABNORMAL LOW (ref 8.9–10.3)
Chloride: 105 mmol/L (ref 98–111)
Creatinine, Ser: 2.01 mg/dL — ABNORMAL HIGH (ref 0.61–1.24)
GFR, Estimated: 40 mL/min — ABNORMAL LOW (ref >60)
Glucose, Bld: 120 mg/dL — ABNORMAL HIGH (ref 70–99)
Potassium: 4.2 mmol/L (ref 3.5–5.1)
Sodium: 134 mmol/L — ABNORMAL LOW (ref 135–145)
Total Bilirubin: 0.8 mg/dL (ref 0.3–1.2)
Total Protein: 7.2 g/dL (ref 6.5–8.1)

## 2023-01-27 LAB — CBC
HCT: 37.3 % — ABNORMAL LOW (ref 39.0–52.0)
Hemoglobin: 12 g/dL — ABNORMAL LOW (ref 13.0–17.0)
MCH: 25.5 pg — ABNORMAL LOW (ref 26.0–34.0)
MCHC: 32.2 g/dL (ref 30.0–36.0)
MCV: 79.4 fL — ABNORMAL LOW (ref 80.0–100.0)
Platelets: 328 10*3/uL (ref 150–400)
RBC: 4.7 MIL/uL (ref 4.22–5.81)
RDW: 16.5 % — ABNORMAL HIGH (ref 11.5–15.5)
WBC: 23.4 10*3/uL — ABNORMAL HIGH (ref 4.0–10.5)
nRBC: 0 % (ref 0.0–0.2)

## 2023-01-27 MED ORDER — PANTOPRAZOLE SODIUM 40 MG PO TBEC
40.0000 mg | DELAYED_RELEASE_TABLET | Freq: Two times a day (BID) | ORAL | Status: DC
  Administered 2023-01-27 – 2023-01-29 (×5): 40 mg via ORAL
  Filled 2023-01-27 (×5): qty 1

## 2023-01-27 MED ORDER — METOCLOPRAMIDE HCL 5 MG/ML IJ SOLN
5.0000 mg | Freq: Four times a day (QID) | INTRAMUSCULAR | Status: DC | PRN
  Administered 2023-01-27 (×2): 5 mg via INTRAVENOUS
  Filled 2023-01-27 (×2): qty 2

## 2023-01-27 MED ORDER — DILTIAZEM HCL ER COATED BEADS 120 MG PO CP24
240.0000 mg | ORAL_CAPSULE | Freq: Every day | ORAL | Status: DC
  Administered 2023-01-27 – 2023-01-29 (×3): 240 mg via ORAL
  Filled 2023-01-27 (×3): qty 2

## 2023-01-27 NOTE — Progress Notes (Signed)
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  He has a chronic wound involving the right foot and an ulcer on the right second toe.  He reports rigors.  He was found to have right lower extremity cellulitis and sepsis and lactic acidosis.  A-fib with RVR.  Cardiology assisting with management of A-fib. He was evaluated by Dr. Lajoyce Corners for cellulitis and concern of osteomyelitis of lower extremity.  Dr. Lajoyce Corners does not think the patient has active osteomyelitis.   Assessment & Plan:   Principal Problem:   Sepsis (HCC) Active Problems:   Cellulitis of right lower extremity   Typical atrial flutter (HCC)  1-Right lower extremity cellulitis/sepsis present on admission/lactic acidosis -Patient presents with fever, leukocytosis, tachycardia and tachypnea, source of infection subsequently identified, right lower extremity cellulitis. -CT of the right thigh only showed lymphadenopathy no abscess. -MRI was obtained which showed multiple findings raising concern for osteomyelitis.  Seen by Dr. Lajoyce Corners who does not feel patient has clinically osteomyelitis. -Subsequently developed erythema of the right lower leg.  CT of the right lower leg suggested cellulitis no abscess. -Initially treated with vancomycin cefepime and metronidazole, vancomycin discontinued due to worsening renal function and MRSA PCR negative. -He was transitioned to oral antibiotics cefadroxil and doxycycline on 10/3. -Patient report improvement of redness. -Dopplers negative for DVT -Continue to monitor WBC, increasing.  -Wound care  2-Chronic systolic heart failure Last  echo done earlier this month showed improvement of ejection fraction to 50%. Lasix on hold due to sepsis  3-Acute on chronic kidney injury on stage IIIb -Baseline creatinine near 2.  AKI in the setting of sepsis and mild hypovolemia.  Creatinine peaked to 2.5.  Vancomycin was discontinued.  He received IV fluids. Continue to hold diuretics.  Continue to monitor renal function  4-Hyponatremia and hypokalemia: Replaced monitor  A-fib flutter RVR: Appreciate cardiology assistance with metoprolol and Eliquis.  Start Cardizem today  Mild transaminases: Improving.  In the setting of sepsis Hepatitis C antibody pending  History of CVA: Continue with the statins and Eliquis  Diabetes type 2: Continue to hold Mounjaro and Jardiance.  Sliding scale insulin  Hiccup; change PPI to BID. PRN reglan Morbid obesity: Need lifestyle modification  Estimated body mass index is 42.31 kg/m as calculated from the following:   Height as of this encounter: 6\' 8"  (2.032 m).   Weight as of this encounter: 174.7 kg.   DVT prophylaxis: Eliquis Code Status: Full code Family Communication: Discussed with the patient Disposition Plan:  Status is: Inpatient Remains inpatient appropriate because: Management of cellulitis and A-fib    Consultants:  Cardiology Dr Lajoyce Corners  Procedures:    Antimicrobials:    Subjective: He report LE edema and redness is improving.    Objective: Vitals:   01/26/23 0750 01/26/23 1542 01/26/23 2121 01/27/23 0527  BP: (!) 156/122 (!) 158/123 (!) 162/130 (!) 164/123  Pulse: (!) 125 (!) 125 (!) 121 (!) 125  Resp: 18 18    Temp: 98.6 F (37 C) 99 F (37.2 C) 98.4 F (36.9 C)   TempSrc: Oral     SpO2: 97% 96% 98% 98%  Weight:      Height:  Intake/Output Summary (Last 24 hours) at 01/27/2023 0808 Last data filed at 01/26/2023 2203 Gross per 24 hour  Intake 700 ml  Output 1100 ml  Net -400 ml   Filed Weights   01/21/23 1317 01/22/23 0020  Weight: (!)  170.3 kg (!) 174.7 kg    Examination:  General exam: Appears calm and comfortable  Respiratory system: Clear to auscultation. Respiratory effort normal. Cardiovascular system: S1 & S2 heard, RRR.  Gastrointestinal system: Abdomen is nondistended, soft and nontender. No organomegaly or masses felt. Normal bowel sounds heard. Central nervous system: Alert and oriented. No focal neurological deficits. Extremities: significant redness R lower extremity. Blister and skin peeling off .    Data Reviewed: I have personally reviewed following labs and imaging studies  CBC: Recent Labs  Lab 01/21/23 1323 01/22/23 0053 01/23/23 0801 01/24/23 0543 01/25/23 4540 01/26/23 0458 01/27/23 0434  WBC 15.1*   < > 20.8* 21.1* 20.1* 22.9* 23.4*  NEUTROABS 12.9*  --   --   --   --   --   --   HGB 13.3   < > 11.9* 11.9* 11.9* 12.1* 12.0*  HCT 42.4   < > 37.7* 36.8* 36.7* 37.0* 37.3*  MCV 81.1   < > 78.7* 78.5* 77.9* 77.2* 79.4*  PLT 216   < > 197 211 254 292 328   < > = values in this interval not displayed.   Basic Metabolic Panel: Recent Labs  Lab 01/22/23 1200 01/23/23 0801 01/24/23 0543 01/25/23 0632 01/26/23 0458 01/27/23 0434  NA 133* 130* 132* 132* 132* 134*  K 3.4* 3.5 3.4* 3.5 4.2 4.2  CL 100 101 100 106 105 105  CO2 21* 21* 20* 19* 20* 22  GLUCOSE 181* 134* 121* 118* 111* 120*  BUN 20 22* 20 16 15 15   CREATININE 2.38* 2.51* 2.14* 1.87* 1.85* 2.01*  CALCIUM 8.1* 8.1* 8.3* 8.2* 8.5* 8.8*  MG 2.1  --   --   --   --   --   PHOS 2.9  --   --   --   --   --    GFR: Estimated Creatinine Clearance: 79.3 mL/min (A) (by C-G formula based on SCr of 2.01 mg/dL (H)). Liver Function Tests: Recent Labs  Lab 01/23/23 0801 01/24/23 0543 01/25/23 9811 01/26/23 0458 01/27/23 0434  AST 56* 68* 50* 43* 32  ALT 61* 83* 72* 64* 53*  ALKPHOS 101 91 92 100 102  BILITOT 1.0 0.9 0.8 0.9 0.8  PROT 6.5 6.3* 6.4* 7.1 7.2  ALBUMIN 2.6* 2.3* 2.3* 2.4* 2.3*   No results for input(s): "LIPASE",  "AMYLASE" in the last 168 hours. No results for input(s): "AMMONIA" in the last 168 hours. Coagulation Profile: No results for input(s): "INR", "PROTIME" in the last 168 hours. Cardiac Enzymes: No results for input(s): "CKTOTAL", "CKMB", "CKMBINDEX", "TROPONINI" in the last 168 hours. BNP (last 3 results) No results for input(s): "PROBNP" in the last 8760 hours. HbA1C: No results for input(s): "HGBA1C" in the last 72 hours. CBG: Recent Labs  Lab 01/26/23 0711 01/26/23 1111 01/26/23 1544 01/26/23 2120 01/27/23 0720  GLUCAP 135* 124* 143* 143* 106*   Lipid Profile: No results for input(s): "CHOL", "HDL", "LDLCALC", "TRIG", "CHOLHDL", "LDLDIRECT" in the last 72 hours. Thyroid Function Tests: No results for input(s): "TSH", "T4TOTAL", "FREET4", "T3FREE", "THYROIDAB" in the last 72 hours. Anemia Panel: Recent Labs    01/25/23 0632  VITAMINB12 572  FOLATE 8.9  FERRITIN 549*  TIBC 186*  IRON 14*  RETICCTPCT 0.5   Sepsis Labs: Recent Labs  Lab 01/21/23 1655 01/21/23 1718 01/24/23 0543 01/25/23 0981  PROCALCITON  --   --  7.60 4.61  LATICACIDVEN 2.5* 1.0  --   --     Recent Results (from the past 240 hour(s))  Culture, blood (routine x 2)     Status: None   Collection Time: 01/21/23  2:12 PM   Specimen: BLOOD  Result Value Ref Range Status   Specimen Description BLOOD SITE NOT SPECIFIED  Final   Special Requests   Final    BOTTLES DRAWN AEROBIC AND ANAEROBIC Blood Culture adequate volume   Culture   Final    NO GROWTH 5 DAYS Performed at [facility], [address] [place], [place] [address]    Report Status 01/26/2023 FINAL  Final  Culture, blood (routine x 2)     Status: None   Collection Time: 01/21/23  2:17 PM   Specimen: BLOOD  Result Value Ref Range Status   Specimen Description BLOOD SITE NOT SPECIFIED  Final   Special Requests   Final    BOTTLES DRAWN AEROBIC ONLY Blood Culture adequate volume   Culture   Final    NO GROWTH 5 DAYS Performed  at [facility], [address] [place], [place] [address]    Report Status 01/26/2023 FINAL  Final  MRSA Next Gen by PCR, Nasal     Status: None   Collection Time: 01/23/23  6:16 PM   Specimen: Nasal Mucosa; Nasal Swab  Result Value Ref Range Status   MRSA by PCR Next Gen NOT DETECTED NOT DETECTED Final    Comment: (NOTE) The GeneXpert MRSA Assay (FDA approved for NASAL specimens only), is one component of a comprehensive MRSA colonization surveillance program. It is not intended to diagnose MRSA infection nor to guide or monitor treatment for MRSA infections. Test performance is not FDA approved in patients less than 102 years old. Performed at [facility], [address] [place], [place] [address]          Radiology Studies: No results found.      Scheduled Meds:  apixaban  5 mg Oral BID   atorvastatin  20 mg Oral QHS   cefadroxil  500 mg Oral BID   doxycycline  100 mg Oral Q12H   gabapentin  100 mg Oral TID   insulin aspart  0-6 Units Subcutaneous TID WC   isosorbide-hydrALAZINE  2 tablet Oral TID   metoprolol succinate  100 mg Oral BID   pantoprazole  40 mg Oral Q1200   Continuous Infusions:   LOS: 6 days    Time spent: 35 minutes    Senna Lape A Enna Warwick, MD [facility]   If 7PM-7AM, please contact night-coverage [email or web address]  01/27/2023, 8:08 AM

## 2023-01-27 NOTE — Progress Notes (Cosign Needed Addendum)
Patient Name: Randy [name] Date of Encounter: 01/27/2023  Primary Cardiologist: None Electrophysiologist: Will Jorja Loa, MD  Interval Summary   The patient is doing ok today > notes ongoing pain with his leg. Received 2gm tylenol overnight.   At this time, the patient denies chest pain, shortness of breath, or any new concerns.  Vital Signs    Vitals:   01/26/23 2121 01/27/23 0527 01/27/23 0818 01/27/23 0850  BP: (!) 162/130 (!) 164/123 (!) 164/110 (!) 168/122  Pulse: (!) 121 (!) 125  (!) 127  Resp:    16  Temp: 98.4 F (36.9 C)   98.6 F (37 C)  TempSrc:    Oral  SpO2: 98% 98%  96%  Weight:      Height:        Intake/Output Summary (Last 24 hours) at 01/27/2023 1111 Last data filed at 01/26/2023 2203 Gross per 24 hour  Intake 700 ml  Output 700 ml  Net 0 ml   Filed Weights   01/21/23 1317 01/22/23 0020  Weight: (!) 170.3 kg (!) 174.7 kg    Physical Exam    GEN- The patient is well appearing, alert and oriented x 3 today.   Lungs- Clear to ausculation bilaterally, normal work of breathing Cardiac- irregular rate and rhythm, no murmurs, rubs or gallops GI- soft, NT, ND, + BS Extremities- no clubbing or cyanosis. RLE edema, erythema, dark areas on anterior LE, ulceration on calf  Telemetry    AF 110-120's (personally reviewed)  Hospital Course    Tyqwan [name] is a 52 y.o. male admitted 01/21/23 for RLE swelling.  PMH of longstanding thyroid issues previously on methimazole and prednisone, AF/AFL s/p ablation with PVI with box lesion, subsequently had AFL with RVR s/p DCCV with ERAF with coarse AF vs AFL in 11/2022. Restarted on Toprol for rate control. Seen as outpt 9/6 with plan for AFL ablation and re-look PVI.  He presented 01/21/23 with sepsis secondary to RLE cellulitis and R inguinal LAN. CT of R thigh showed LAN and no overt abscess.  MRI showed concern for osteomyelitis. Evaluated by Dr. Lajoyce Corners who does not feel the pt has osteomyelitis.  WBC 22k 10/1,  on cefepime + flagyl. Remains tachycardic. Per [facility], leg does look improved overall.  Fever curve defervesced but is continuing to use tylenol.   Assessment & Plan    Persistent Atrial Fibrillation  Atrial Flutter, Typical Appearing  S/p PVI ablation 09/2022 with box lesion.  -continue Toprol 100 mg BID (changed this admit) -add cardizem CD 240mg  every day (should help HTN as well) -anticipate HR will improve as sepsis physiology resolves  -follow up appt changed to AF clinic on 10/10 at 2:30   Secondary Hypercoagulable State  -continue eliquis 5mg  BID    Sepsis secondary to Cellulitis  -per [facility]    Chronic Systolic CHF  EF on TEE 09/2022 25-30%, felt to be tachy-mediated  -monitor I/O's  -per primary    HTN  -per primary    Hypothyroidism  -per primary    DM II  Hgb A1c 5.8% -per primary       For questions or updates, please contact [facility] Please consult [email or web address] for contact info under Cardiology/STEMI.  Signed, Canary Brim, MSN, APRN, NP-C, AGACNP-BC [facility] - Electrophysiology  01/27/2023, 11:17 AM  I have seen and examined this patient with Canary Brim.  Agree with above, note added to reflect my findings.  Patient feels palpitations but otherwise not aware of his arrhythmia.  GEN: Well nourished, well developed, in no acute distress  HEENT: normal  Neck: no JVD, carotid bruits, or masses Cardiac: Tachycardic, regular  Respiratory:  clear to auscultation bilaterally, normal work of breathing GI: soft, nontender, nondistended, + BS MS: no deformity or atrophy  Skin: Redness and ulceration to right lower leg Neuro:  Strength and sensation are intact Psych: euthymic mood, full affect   Persistent atrial fibrillation/typical atrial flutter: Post PVI.  Has continued atrial flutter, difficult to control.  Likely a component of sepsis and inflammation associated with his tachycardia.  He will need repeat ablation.  Patient has agreed and  will schedule as an outpatient.  Otherwise rate controlled with metoprolol and diltiazem. Second hypercoagulable state: Continue Eliquis Sepsis due to cellulitis: Plan per primary team  Will M. Camnitz MD 01/28/2023 9:47 AM

## 2023-01-28 DIAGNOSIS — A419 Sepsis, unspecified organism: Secondary | ICD-10-CM | POA: Diagnosis not present

## 2023-01-28 LAB — GLUCOSE, CAPILLARY
Glucose-Capillary: 115 mg/dL — ABNORMAL HIGH (ref 70–99)
Glucose-Capillary: 133 mg/dL — ABNORMAL HIGH (ref 70–99)
Glucose-Capillary: 148 mg/dL — ABNORMAL HIGH (ref 70–99)
Glucose-Capillary: 158 mg/dL — ABNORMAL HIGH (ref 70–99)

## 2023-01-28 LAB — BASIC METABOLIC PANEL
Anion gap: 8 (ref 5–15)
BUN: 16 mg/dL (ref 6–20)
CO2: 21 mmol/L — ABNORMAL LOW (ref 22–32)
Calcium: 8.7 mg/dL — ABNORMAL LOW (ref 8.9–10.3)
Chloride: 106 mmol/L (ref 98–111)
Creatinine, Ser: 1.91 mg/dL — ABNORMAL HIGH (ref 0.61–1.24)
GFR, Estimated: 42 mL/min — ABNORMAL LOW (ref >60)
Glucose, Bld: 117 mg/dL — ABNORMAL HIGH (ref 70–99)
Potassium: 3.9 mmol/L (ref 3.5–5.1)
Sodium: 135 mmol/L (ref 135–145)

## 2023-01-28 LAB — CBC
HCT: 34.6 % — ABNORMAL LOW (ref 39.0–52.0)
Hemoglobin: 11.1 g/dL — ABNORMAL LOW (ref 13.0–17.0)
MCH: 24.8 pg — ABNORMAL LOW (ref 26.0–34.0)
MCHC: 32.1 g/dL (ref 30.0–36.0)
MCV: 77.4 fL — ABNORMAL LOW (ref 80.0–100.0)
Platelets: 367 10*3/uL (ref 150–400)
RBC: 4.47 MIL/uL (ref 4.22–5.81)
RDW: 16.2 % — ABNORMAL HIGH (ref 11.5–15.5)
WBC: 19.7 10*3/uL — ABNORMAL HIGH (ref 4.0–10.5)
nRBC: 0 % (ref 0.0–0.2)

## 2023-01-28 MED ORDER — AMLODIPINE BESYLATE 5 MG PO TABS
5.0000 mg | ORAL_TABLET | Freq: Every day | ORAL | Status: DC
  Administered 2023-01-28 – 2023-01-29 (×2): 5 mg via ORAL
  Filled 2023-01-28 (×2): qty 1

## 2023-01-28 MED ORDER — PHENOL 1.4 % MT LIQD
1.0000 | OROMUCOSAL | Status: DC | PRN
  Filled 2023-01-28: qty 177

## 2023-01-28 MED ORDER — MENTHOL 3 MG MT LOZG
1.0000 | LOZENGE | OROMUCOSAL | Status: DC | PRN
  Administered 2023-01-28: 3 mg via ORAL
  Filled 2023-01-28: qty 9

## 2023-01-28 NOTE — Progress Notes (Signed)
[facility] night cross cover note:   I was notified by RN that the patient is complaining of a sore throat.  I subsequently ordered prn Cepacol throat lozenges as well as prn Chloraseptic throat spray for him.    Newton Pigg, DO Hospitalist

## 2023-01-28 NOTE — Plan of Care (Signed)
  Problem: Education: Goal: Understanding of disease, treatment, and recovery process will improve Outcome: Progressing   Problem: Activity: Goal: Ability to return to baseline activity level will improve Outcome: Progressing   Problem: Cardiac: Goal: Ability to maintain adequate cardiovascular perfusion will improve Outcome: Progressing Goal: Vascular access site(s) Level 0-1 will be maintained Outcome: Progressing   Problem: Health Behavior/ Discharge Planning: Goal: Ability to safely manage health related needs after discharge Outcome: Progressing   Problem: Education: Goal: Ability to describe self-care measures that may prevent or decrease complications (Diabetes Survival Skills Education) will improve Outcome: Progressing Goal: Individualized Educational Video(s) Outcome: Progressing   Problem: Coping: Goal: Ability to adjust to condition or change in health will improve Outcome: Progressing   Problem: Fluid Volume: Goal: Ability to maintain a balanced intake and output will improve Outcome: Progressing   Problem: Health Behavior/Discharge Planning: Goal: Ability to identify and utilize available resources and services will improve Outcome: Progressing Goal: Ability to manage health-related needs will improve Outcome: Progressing   Problem: Metabolic: Goal: Ability to maintain appropriate glucose levels will improve Outcome: Progressing   Problem: Nutritional: Goal: Maintenance of adequate nutrition will improve Outcome: Progressing Goal: Progress toward achieving an optimal weight will improve Outcome: Progressing   Problem: Skin Integrity: Goal: Risk for impaired skin integrity will decrease Outcome: Progressing   Problem: Tissue Perfusion: Goal: Adequacy of tissue perfusion will improve Outcome: Progressing   Problem: Education: Goal: Knowledge of General Education information will improve Description: Including pain rating scale, medication(s)/side  effects and non-pharmacologic comfort measures Outcome: Progressing   Problem: Health Behavior/Discharge Planning: Goal: Ability to manage health-related needs will improve Outcome: Progressing   Problem: Clinical Measurements: Goal: Ability to maintain clinical measurements within normal limits will improve Outcome: Progressing Goal: Will remain free from infection Outcome: Progressing Goal: Diagnostic test results will improve Outcome: Progressing Goal: Respiratory complications will improve Outcome: Progressing Goal: Cardiovascular complication will be avoided Outcome: Progressing   Problem: Activity: Goal: Risk for activity intolerance will decrease Outcome: Progressing   Problem: Nutrition: Goal: Adequate nutrition will be maintained Outcome: Progressing   Problem: Coping: Goal: Level of anxiety will decrease Outcome: Progressing   Problem: Elimination: Goal: Will not experience complications related to bowel motility Outcome: Progressing Goal: Will not experience complications related to urinary retention Outcome: Progressing   Problem: Pain Managment: Goal: General experience of comfort will improve Outcome: Progressing   Problem: Safety: Goal: Ability to remain free from injury will improve Outcome: Progressing   Problem: Skin Integrity: Goal: Risk for impaired skin integrity will decrease Outcome: Progressing   Problem: Fluid Volume: Goal: Hemodynamic stability will improve Outcome: Progressing   Problem: Clinical Measurements: Goal: Diagnostic test results will improve Outcome: Progressing Goal: Signs and symptoms of infection will decrease Outcome: Progressing   Problem: Respiratory: Goal: Ability to maintain adequate ventilation will improve Outcome: Progressing

## 2023-01-28 NOTE — Progress Notes (Addendum)
EP Telemetry Review:    Blood pressure (!) 145/94, pulse (!) 122, temperature 99.2 F (37.3 C), resp. rate 18, height 6\' 8"  (2.032 m), weight (!) 174.7 kg, SpO2 96%.   Pt remains in AFL 100-110's, modest improvement in rate.  BP remains elevated current regimen.     Sepsis treatment primary focus. EP will continue to follow.     Canary Brim, MSN, APRN, NP-C, AGACNP-BC [facility] - Electrophysiology  01/28/2023, 7:54 AM

## 2023-01-28 NOTE — Progress Notes (Signed)
PROGRESS NOTE    Randy [name]  WRU:[id number] DOB: October 25, 1970 DOA: 01/21/2023 PCP: Pcp, No   Brief Narrative: 52 year old with past medical history significant for diabetes, previous osteomyelitis and amputation involving the right first toe, A-fib on anticoagulation, heart failure with improved ejection fraction, hypertension, CVA, CKD stage III presents with 3 days of fever chills and right medial thigh pain.  He has a chronic wound involving the right foot and an ulcer on the right second toe.  He reports rigors.  He was found to have right lower extremity cellulitis and sepsis and lactic acidosis.  A-fib with RVR.  Cardiology assisting with management of A-fib. He was evaluated by Dr. Lajoyce Corners for cellulitis and concern of osteomyelitis of lower extremity.  Dr. Lajoyce Corners does not think the patient has active osteomyelitis.   Assessment & Plan:   Principal Problem:   Sepsis (HCC) Active Problems:   Cellulitis of right lower extremity   Typical atrial flutter (HCC)  1-Right lower extremity cellulitis/sepsis present on admission/lactic acidosis -Patient presents with fever, leukocytosis, tachycardia and tachypnea, source of infection subsequently identified, right lower extremity cellulitis. -CT of the right thigh only showed lymphadenopathy no abscess. -MRI was obtained which showed multiple findings raising concern for osteomyelitis.  Seen by Dr. Lajoyce Corners who does not feel patient has clinically osteomyelitis. -Subsequently developed erythema of the right lower leg.  CT of the right lower leg suggested cellulitis no abscess. -Initially treated with vancomycin cefepime and metronidazole, vancomycin discontinued due to worsening renal function and MRSA PCR negative. -He was transitioned to oral antibiotics cefadroxil and doxycycline on 10/3. -Patient report improvement of redness. -Dopplers negative for DVT -WBC trending down. Redness improving.  -Wound care  2-Chronic systolic heart  failure Last echo done earlier this month showed improvement of ejection fraction to 50%. 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  AKI in the setting of sepsis and mild hypovolemia.  Creatinine peaked to 2.5.  Vancomycin was discontinued.  He received IV fluids. Continue to hold diuretics.  Continue to monitor renal function Renal function stable.   4-Hyponatremia and hypokalemia: Replaced monitor  A-fib flutter RVR: Appreciate cardiology assistance with metoprolol and Eliquis.  Started Cardizem 10/02  Mild transaminases: Improving.  In the setting of sepsis Hepatitis C antibody pending  History of CVA: Continue with the statins and Eliquis  Diabetes type 2: Continue to hold Mounjaro and Jardiance.  Sliding scale insulin  Hiccup; change PPI to BID. PRN reglan Morbid obesity: Need lifestyle modification HTN, uncontrolled. Will add Norvasc.   Estimated body mass index is 42.31 kg/m as calculated from the following:   Height as of this encounter: 6\' 8"  (2.032 m).   Weight as of this encounter: 174.7 kg.   DVT prophylaxis: Eliquis Code Status: Full code Family Communication: Discussed with the patient Disposition Plan:  Status is: Inpatient Remains inpatient appropriate because: Management of cellulitis and A-fib    Consultants:  Cardiology Dr Lajoyce Corners  Procedures:    Antimicrobials:    Subjective: He is doing well. Redness is better. They did wound care this morning.    Objective: Vitals:   01/27/23 2051 01/28/23 0451 01/28/23 0739 01/28/23 1148  BP: (!) 154/112 (!) 145/94 (!) 165/115 (!) 138/115  Pulse: (!) 116 (!) 122 (!) 122 (!) 125  Resp: 18 18  18   Temp: 98.8 F (37.1 C) 99.2 F (37.3 C)  98.2 F (36.8 C)  TempSrc:   Oral   SpO2: 97% 96% 97% 99%  Weight:      Height:        Intake/Output Summary (Last 24 hours) at 01/28/2023 1244 Last data filed at  01/28/2023 0840 Gross per 24 hour  Intake 605 ml  Output 3030 ml  Net -2425 ml   Filed Weights   01/21/23 1317 01/22/23 0020  Weight: (!) 170.3 kg (!) 174.7 kg    Examination:  General exam: NAD Respiratory system: CTA Cardiovascular system: S 1, S 2 RRR Gastrointestinal system: BS present, soft, nt Extremities: significant redness R lower extremity. Blister and skin peeling off .    Data Reviewed: I have personally reviewed following labs and imaging studies  CBC: Recent Labs  Lab 01/21/23 1323 01/22/23 0053 01/24/23 0543 01/25/23 5284 01/26/23 0458 01/27/23 0434 01/28/23 0527  WBC 15.1*   < > 21.1* 20.1* 22.9* 23.4* 19.7*  NEUTROABS 12.9*  --   --   --   --   --   --   HGB 13.3   < > 11.9* 11.9* 12.1* 12.0* 11.1*  HCT 42.4   < > 36.8* 36.7* 37.0* 37.3* 34.6*  MCV 81.1   < > 78.5* 77.9* 77.2* 79.4* 77.4*  PLT 216   < > 211 254 292 328 367   < > = values in this interval not displayed.   Basic Metabolic Panel: Recent Labs  Lab 01/22/23 1200 01/23/23 0801 01/24/23 0543 01/25/23 1324 01/26/23 0458 01/27/23 0434 01/28/23 0527  NA 133*   < > 132* 132* 132* 134* 135  K 3.4*   < > 3.4* 3.5 4.2 4.2 3.9  CL 100   < > 100 106 105 105 106  CO2 21*   < > 20* 19* 20* 22 21*  GLUCOSE 181*   < > 121* 118* 111* 120* 117*  BUN 20   < > 20 16 15 15 16   CREATININE 2.38*   < > 2.14* 1.87* 1.85* 2.01* 1.91*  CALCIUM 8.1*   < > 8.3* 8.2* 8.5* 8.8* 8.7*  MG 2.1  --   --   --   --   --   --   PHOS 2.9  --   --   --   --   --   --    < > = values in this interval not displayed.   GFR: Estimated Creatinine Clearance: 83.4 mL/min (A) (by C-G formula based on SCr of 1.91 mg/dL (H)). Liver Function Tests: Recent Labs  Lab 01/23/23 0801 01/24/23 0543 01/25/23 4010 01/26/23 0458 01/27/23 0434  AST 56* 68* 50* 43* 32  ALT 61* 83* 72* 64* 53*  ALKPHOS 101 91 92 100 102  BILITOT 1.0 0.9 0.8 0.9 0.8  PROT 6.5 6.3* 6.4* 7.1 7.2  ALBUMIN 2.6* 2.3* 2.3* 2.4* 2.3*   No results  for input(s): "LIPASE", "AMYLASE" in the last 168 hours. No results for input(s): "AMMONIA" in the last 168 hours. Coagulation Profile: No results for input(s): "INR", "PROTIME" in the last 168 hours. Cardiac Enzymes: No results for input(s): "CKTOTAL", "CKMB", "CKMBINDEX", "TROPONINI" in the last 168 hours. BNP (last 3 results) No results for input(s): "PROBNP" in the last 8760 hours. HbA1C: No results for input(s): "HGBA1C" in the last 72 hours. CBG: Recent Labs  Lab 01/27/23 1127 01/27/23 1633 01/27/23 2011 01/28/23 0743 01/28/23 1151  GLUCAP 123* 148* 129* 115* 133*   Lipid Profile: No results for input(s): "CHOL", "HDL", "LDLCALC", "TRIG", "CHOLHDL", "LDLDIRECT"  in the last 72 hours. Thyroid Function Tests: No results for input(s): "TSH", "T4TOTAL", "FREET4", "T3FREE", "THYROIDAB" in the last 72 hours. Anemia Panel: No results for input(s): "VITAMINB12", "FOLATE", "FERRITIN", "TIBC", "IRON", "RETICCTPCT" in the last 72 hours.  Sepsis Labs: Recent Labs  Lab 01/21/23 1655 01/21/23 1718 01/24/23 0543 01/25/23 4742  PROCALCITON  --   --  7.60 4.61  LATICACIDVEN 2.5* 1.0  --   --     Recent Results (from the past 240 hour(s))  Culture, blood (routine x 2)     Status: None   Collection Time: 01/21/23  2:12 PM   Specimen: BLOOD  Result Value Ref Range Status   Specimen Description BLOOD SITE NOT SPECIFIED  Final   Special Requests   Final    BOTTLES DRAWN AEROBIC AND ANAEROBIC Blood Culture adequate volume   Culture   Final    NO GROWTH 5 DAYS Performed at [facility], [address], [place], [place] [address]    Report Status 01/26/2023 FINAL  Final  Culture, blood (routine x 2)     Status: None   Collection Time: 01/21/23  2:17 PM   Specimen: BLOOD  Result Value Ref Range Status   Specimen Description BLOOD SITE NOT SPECIFIED  Final   Special Requests   Final    BOTTLES DRAWN AEROBIC ONLY Blood Culture adequate volume   Culture   Final    NO GROWTH 5  DAYS Performed at [facility], [address] [place], [place] [address]    Report Status 01/26/2023 FINAL  Final  MRSA Next Gen by PCR, Nasal     Status: None   Collection Time: 01/23/23  6:16 PM   Specimen: Nasal Mucosa; Nasal Swab  Result Value Ref Range Status   MRSA by PCR Next Gen NOT DETECTED NOT DETECTED Final    Comment: (NOTE) The GeneXpert MRSA Assay (FDA approved for NASAL specimens only), is one component of a comprehensive MRSA colonization surveillance program. It is not intended to diagnose MRSA infection nor to guide or monitor treatment for MRSA infections. Test performance is not FDA approved in patients less than 68 years old. Performed at [facility], [address], [place], [place] [address]          Radiology Studies: No results found.      Scheduled Meds:  amLODipine  5 mg Oral Daily   apixaban  5 mg Oral BID   atorvastatin  20 mg Oral QHS   cefadroxil  500 mg Oral BID   diltiazem  240 mg Oral Daily   doxycycline  100 mg Oral Q12H   insulin aspart  0-6 Units Subcutaneous TID WC   isosorbide-hydrALAZINE  2 tablet Oral TID   metoprolol succinate  100 mg Oral BID   pantoprazole  40 mg Oral BID   Continuous Infusions:   LOS: 7 days    Time spent: 35 minutes    [place] Urieta A Chantrice Hagg, MD [facility]   If 7PM-7AM, please contact night-coverage [email or web address]  01/28/2023, 12:44 PM

## 2023-01-29 ENCOUNTER — Ambulatory Visit: Payer: 59 | Admitting: Cardiology

## 2023-01-29 ENCOUNTER — Other Ambulatory Visit (HOSPITAL_COMMUNITY): Payer: Self-pay

## 2023-01-29 DIAGNOSIS — A419 Sepsis, unspecified organism: Secondary | ICD-10-CM | POA: Diagnosis not present

## 2023-01-29 LAB — GLUCOSE, CAPILLARY
Glucose-Capillary: 105 mg/dL — ABNORMAL HIGH (ref 70–99)
Glucose-Capillary: 106 mg/dL — ABNORMAL HIGH (ref 70–99)

## 2023-01-29 MED ORDER — DOXYCYCLINE HYCLATE 100 MG PO TABS
100.0000 mg | ORAL_TABLET | Freq: Two times a day (BID) | ORAL | 0 refills | Status: AC
  Filled 2023-01-29: qty 10, 5d supply, fill #0

## 2023-01-29 MED ORDER — OXYCODONE HCL 5 MG PO TABS
5.0000 mg | ORAL_TABLET | Freq: Four times a day (QID) | ORAL | 0 refills | Status: AC | PRN
Start: 2023-01-29 — End: 2023-02-06
  Filled 2023-01-29: qty 30, 5d supply, fill #0

## 2023-01-29 MED ORDER — DILTIAZEM HCL ER COATED BEADS 180 MG PO CP24: 360.0000 mg | ORAL_CAPSULE | Freq: Every day | ORAL | Status: DC

## 2023-01-29 MED ORDER — DILTIAZEM HCL ER COATED BEADS 360 MG PO CP24
360.0000 mg | ORAL_CAPSULE | Freq: Every day | ORAL | 0 refills | Status: DC
  Filled 2023-01-29: qty 30, 30d supply, fill #0

## 2023-01-29 MED ORDER — CEFADROXIL 500 MG PO CAPS
500.0000 mg | ORAL_CAPSULE | Freq: Two times a day (BID) | ORAL | 0 refills | Status: AC
  Filled 2023-01-29: qty 10, 5d supply, fill #0

## 2023-01-29 NOTE — TOC Transition Note (Signed)
Transition of Care [facility]) - CM/SW Discharge Note   Patient Details  Name: Randy [name] MRN: [id number] Date of Birth: January 08, 1971  Transition of Care [facility]) CM/SW Contact:  [name], [name], RN Phone Number: 01/29/2023, 12:31 PM   Clinical Narrative:     Patient is scheduled for discharge today.  Outpatient f/u, hospital f/u and discharge instructions on AVS. Prescriptions sent to [facility] and patient will pickup at discharge. No TOC needs or recommendations noted. Wife, Ladean Raya to transport at discharge.  No further TOC needs noted.         Final next level of care: Home/Self Care Barriers to Discharge: Barriers Resolved   Patient Goals and CMS Choice CMS Medicare.gov Compare Post Acute Care list provided to:: Patient Choice offered to / list presented to : NA  Discharge Placement                  Patient to be transferred to facility by: Wife Name of family member notified: [facility]    Discharge Plan and Services Additional resources added to the After Visit Summary for                  DME Arranged: N/A DME Agency: NA       HH Arranged: NA HH Agency: NA        Social Determinants of Health (SDOH) Interventions SDOH Screenings   Food Insecurity: No Food Insecurity (01/22/2023)  Housing: Low Risk  (01/22/2023)  Transportation Needs: No Transportation Needs (01/22/2023)  Utilities: Not At Risk (01/22/2023)  Alcohol Screen: Low Risk  (01/27/2021)  Financial Resource Strain: Low Risk  (01/27/2021)  Social Connections: Unknown (09/06/2021)   Received from [facility], [facility]  Tobacco Use: Medium Risk (01/22/2023)     Readmission Risk Interventions     No data to display

## 2023-01-29 NOTE — Progress Notes (Signed)
EP Telemetry / Chart Review:    Tele: pt remains in AFL 110-120's, no significant change.   HTN: remains HTN'sive, increase diltiazem to 360mg  (for rate), stop norvasc. Consider addition of spironolactone as next step. Renal function appears to be back to baseline, defer timing of restart of entresto etc to TRH.  EP follow up arranged.    Plan of care discussed with Dr. Elberta Fortis. EP will follow peripherally. Please call with new concerns.     Canary Brim, MSN, APRN, NP-C, AGACNP-BC [facility] - Electrophysiology  01/29/2023, 9:35 AM

## 2023-01-29 NOTE — Discharge Summary (Incomplete)
Physician Discharge Summary   Patient: Randy [name] MRN: [id number] DOB: 01-06-1971  Admit date:     01/21/2023  Discharge date: {dischdate:26783}  Discharge Physician: Alba Cory   PCP: Pcp, No   Recommendations at discharge:  {Tip this will not be part of the note when signed- Example include specific recommendations for outpatient follow-up, pending tests to follow-up on. (Optional):26781}  ***  Discharge Diagnoses: Principal Problem:   Sepsis (HCC) Active Problems:   Cellulitis of right lower extremity   Typical atrial flutter (HCC)  Resolved Problems:   * No resolved hospital problems. [facility] Course: No notes on file  Assessment and Plan: No notes have been filed under this hospital service. Service: Hospitalist     {Tip this will not be part of the note when signed Body mass index is 42.31 kg/m. , ,  (Optional):26781}  {(NOTE) Pain control PDMP Statment (Optional):26782} Consultants: *** Procedures performed: ***  Disposition: {Plan; Disposition:26390} Diet recommendation:  Discharge Diet Orders (From admission, onward)     Start     Ordered   01/29/23 0000  Diet - low sodium heart healthy        01/29/23 1053           {Diet_Plan:26776} DISCHARGE MEDICATION: Allergies as of 01/29/2023       Reactions   Fish-derived Products Anaphylaxis   Shellfish Allergy Anaphylaxis        Medication List     STOP taking these medications    Entresto 97-103 MG Generic drug: sacubitril-valsartan       TAKE these medications    apixaban 5 MG Tabs tablet Commonly known as: ELIQUIS Take 1 tablet (5 mg total) by mouth 2 (two) times daily.   atorvastatin 20 MG tablet Commonly known as: LIPITOR Take 1 tablet (20 mg total) by mouth at bedtime.   cefadroxil 500 MG capsule Commonly known as: DURICEF Take 1 capsule (500 mg total) by mouth 2 (two) times daily for 5 days.   diltiazem 360 MG 24 hr capsule Commonly known as: CARDIZEM  CD Take 1 capsule (360 mg total) by mouth daily. Start taking on: January 30, 2023   doxycycline 100 MG tablet Commonly known as: VIBRA-TABS Take 1 tablet (100 mg total) by mouth every 12 (twelve) hours for 5 days.   FreeStyle Libre 3 Sensor Misc 1 each by Does not apply route every 14 (fourteen) days.   furosemide 20 MG tablet Commonly known as: LASIX Take 2 tablets (40 mg total) by mouth daily.   isosorbide-hydrALAZINE 20-37.5 MG tablet Commonly known as: BIDIL Take 2 tablets by mouth 3 (three) times daily.   Jardiance 10 MG Tabs tablet Generic drug: empagliflozin Take 1 tablet by mouth once daily   metoprolol succinate 100 MG 24 hr tablet Commonly known as: TOPROL-XL Take 1 tablet (100 mg total) by mouth at bedtime. Take with or immediately following a meal.   Mounjaro 12.5 MG/0.5ML Pen Generic drug: tirzepatide Inject 12.5 mg into the skin once a week.   oxyCODONE 5 MG immediate release tablet Commonly known as: Oxy IR/ROXICODONE Take 1-2 tablets (5-10 mg total) by mouth every 6 (six) hours as needed for up to 4 days for moderate pain.               Discharge Care Instructions  (From admission, onward)           Start     Ordered   01/29/23 0000  Discharge wound care:  PERO     Full                                                        +---------+---------------+---------+-----------+----------+--------------+   +----+---------------+---------+-----------+----------+--------------+ LEFTCompressibilityPhasicitySpontaneityPropertiesThrombus Aging +----+---------------+---------+-----------+----------+--------------+ CFV Full           Yes      Yes                                 +----+---------------+---------+-----------+----------+--------------+     Summary: RIGHT: - There is no evidence of deep vein thrombosis in the lower extremity.  - No cystic structure found in the popliteal fossa. - Ultrasound characteristics of enlarged lymph nodes are noted in the groin and popliteal fossa. - Subcutaneous fluid noted in area of calf and ankle.  LEFT: - No evidence of common femoral vein obstruction.   *See table(s) above for measurements and observations. Electronically signed by Heath Lark on 01/21/2023 at 5:09:29 PM.    Final    ECHOCARDIOGRAM COMPLETE  Result Date: 01/15/2023    ECHOCARDIOGRAM REPORT   Patient Name:   Randy [name] Date of Exam: 01/15/2023 Medical Rec #:  [id number]      Height:       80.0 in Accession #:    [id number]     Weight:       375.4 lb Date of Birth:  [birth year]      BSA:          3.005 m Patient Age:    52 years       BP:           144/100 mmHg Patient Gender: M              HR:           82 bpm. Exam Location:  [address] Procedure: 2D Echo, Cardiac Doppler and Color Doppler Indications:    I48.19 Atrial fibrillation  History:        Patient has prior history of Echocardiogram examinations, most                 recent 04/03/2022. CHF, Stroke and Dilated aortic root,                 Arrythmias:Atrial Fibrillation; Risk Factors:Hypertension and                 Diabetes.  Sonographer:    Samule Ohm RDCS Referring Phys: [id number] WILL MARTIN CAMNITZ  IMPRESSIONS  1. Left ventricular ejection fraction, by estimation, is 50 to 55%. The left ventricle has low normal function. The left ventricle has no regional wall motion abnormalities. The left ventricular internal cavity size was mildly dilated. There is severe concentric left ventricular hypertrophy. Left ventricular diastolic function could not be evaluated.  2. Right ventricular systolic function is mildly reduced. The right ventricular size is mildly enlarged. There is mildly elevated pulmonary artery systolic pressure. The estimated right ventricular systolic pressure is 34.4 mmHg.  3. Left atrial size was severely dilated.  4. Right atrial size was mildly dilated.  5. The mitral valve is normal in structure. No evidence of mitral valve regurgitation. No evidence of mitral stenosis.  6. The aortic valve is tricuspid. Aortic valve regurgitation is not visualized. No aortic stenosis  PERO     Full                                                        +---------+---------------+---------+-----------+----------+--------------+   +----+---------------+---------+-----------+----------+--------------+ LEFTCompressibilityPhasicitySpontaneityPropertiesThrombus Aging +----+---------------+---------+-----------+----------+--------------+ CFV Full           Yes      Yes                                 +----+---------------+---------+-----------+----------+--------------+     Summary: RIGHT: - There is no evidence of deep vein thrombosis in the lower extremity.  - No cystic structure found in the popliteal fossa. - Ultrasound characteristics of enlarged lymph nodes are noted in the groin and popliteal fossa. - Subcutaneous fluid noted in area of calf and ankle.  LEFT: - No evidence of common femoral vein obstruction.   *See table(s) above for measurements and observations. Electronically signed by Heath Lark on 01/21/2023 at 5:09:29 PM.    Final    ECHOCARDIOGRAM COMPLETE  Result Date: 01/15/2023    ECHOCARDIOGRAM REPORT   Patient Name:   Randy [name] Date of Exam: 01/15/2023 Medical Rec #:  [id number]      Height:       80.0 in Accession #:    [id number]     Weight:       375.4 lb Date of Birth:  [birth year]      BSA:          3.005 m Patient Age:    52 years       BP:           144/100 mmHg Patient Gender: M              HR:           82 bpm. Exam Location:  [address] Procedure: 2D Echo, Cardiac Doppler and Color Doppler Indications:    I48.19 Atrial fibrillation  History:        Patient has prior history of Echocardiogram examinations, most                 recent 04/03/2022. CHF, Stroke and Dilated aortic root,                 Arrythmias:Atrial Fibrillation; Risk Factors:Hypertension and                 Diabetes.  Sonographer:    Samule Ohm RDCS Referring Phys: [id number] WILL MARTIN CAMNITZ  IMPRESSIONS  1. Left ventricular ejection fraction, by estimation, is 50 to 55%. The left ventricle has low normal function. The left ventricle has no regional wall motion abnormalities. The left ventricular internal cavity size was mildly dilated. There is severe concentric left ventricular hypertrophy. Left ventricular diastolic function could not be evaluated.  2. Right ventricular systolic function is mildly reduced. The right ventricular size is mildly enlarged. There is mildly elevated pulmonary artery systolic pressure. The estimated right ventricular systolic pressure is 34.4 mmHg.  3. Left atrial size was severely dilated.  4. Right atrial size was mildly dilated.  5. The mitral valve is normal in structure. No evidence of mitral valve regurgitation. No evidence of mitral stenosis.  6. The aortic valve is tricuspid. Aortic valve regurgitation is not visualized. No aortic stenosis  [birth year]       Patient Gender: M Patient Age:   42 years Exam Location:  [facility] Procedure:      VAS [place] LOWER EXTREMITY VENOUS (DVT) Referring Phys: Lyn Hollingshead SCHUTT --------------------------------------------------------------------------------  Indications: Pain.  Anticoagulation: Eliquis (afib). Limitations: Poor ultrasound/tissue interface. Comparison  Study: Previous exam on 09/14/2021 was negative for DVT. Performing Technologist: Ernestene Mention RVT, RDMS  Examination Guidelines: A complete evaluation includes B-mode imaging, spectral Doppler, color Doppler, and power Doppler as needed of all accessible portions of each vessel. Bilateral testing is considered an integral part of a complete examination. Limited examinations for reoccurring indications may be performed as noted. The reflux portion of the exam is performed with the patient in reverse Trendelenburg.  +---------+---------------+---------+-----------+----------+--------------+ RIGHT    CompressibilityPhasicitySpontaneityPropertiesThrombus Aging +---------+---------------+---------+-----------+----------+--------------+ CFV      Full           Yes      Yes                                 +---------+---------------+---------+-----------+----------+--------------+ SFJ      Full                                                        +---------+---------------+---------+-----------+----------+--------------+ FV Prox  Full           Yes      Yes                                 +---------+---------------+---------+-----------+----------+--------------+ FV Mid   Full           Yes      Yes                                 +---------+---------------+---------+-----------+----------+--------------+ FV DistalFull           Yes      Yes                                 +---------+---------------+---------+-----------+----------+--------------+ PFV      Full                                                        +---------+---------------+---------+-----------+----------+--------------+ POP      Full           Yes      Yes                                 +---------+---------------+---------+-----------+----------+--------------+ PTV      Full                                                         +---------+---------------+---------+-----------+----------+--------------+  PERO     Full                                                        +---------+---------------+---------+-----------+----------+--------------+   +----+---------------+---------+-----------+----------+--------------+ LEFTCompressibilityPhasicitySpontaneityPropertiesThrombus Aging +----+---------------+---------+-----------+----------+--------------+ CFV Full           Yes      Yes                                 +----+---------------+---------+-----------+----------+--------------+     Summary: RIGHT: - There is no evidence of deep vein thrombosis in the lower extremity.  - No cystic structure found in the popliteal fossa. - Ultrasound characteristics of enlarged lymph nodes are noted in the groin and popliteal fossa. - Subcutaneous fluid noted in area of calf and ankle.  LEFT: - No evidence of common femoral vein obstruction.   *See table(s) above for measurements and observations. Electronically signed by Heath Lark on 01/21/2023 at 5:09:29 PM.    Final    ECHOCARDIOGRAM COMPLETE  Result Date: 01/15/2023    ECHOCARDIOGRAM REPORT   Patient Name:   Randy [name] Date of Exam: 01/15/2023 Medical Rec #:  [id number]      Height:       80.0 in Accession #:    [id number]     Weight:       375.4 lb Date of Birth:  [birth year]      BSA:          3.005 m Patient Age:    52 years       BP:           144/100 mmHg Patient Gender: M              HR:           82 bpm. Exam Location:  [address] Procedure: 2D Echo, Cardiac Doppler and Color Doppler Indications:    I48.19 Atrial fibrillation  History:        Patient has prior history of Echocardiogram examinations, most                 recent 04/03/2022. CHF, Stroke and Dilated aortic root,                 Arrythmias:Atrial Fibrillation; Risk Factors:Hypertension and                 Diabetes.  Sonographer:    Samule Ohm RDCS Referring Phys: [id number] WILL MARTIN CAMNITZ  IMPRESSIONS  1. Left ventricular ejection fraction, by estimation, is 50 to 55%. The left ventricle has low normal function. The left ventricle has no regional wall motion abnormalities. The left ventricular internal cavity size was mildly dilated. There is severe concentric left ventricular hypertrophy. Left ventricular diastolic function could not be evaluated.  2. Right ventricular systolic function is mildly reduced. The right ventricular size is mildly enlarged. There is mildly elevated pulmonary artery systolic pressure. The estimated right ventricular systolic pressure is 34.4 mmHg.  3. Left atrial size was severely dilated.  4. Right atrial size was mildly dilated.  5. The mitral valve is normal in structure. No evidence of mitral valve regurgitation. No evidence of mitral stenosis.  6. The aortic valve is tricuspid. Aortic valve regurgitation is not visualized. No aortic stenosis  Physician Discharge Summary   Patient: Randy [name] MRN: [id number] DOB: 01-06-1971  Admit date:     01/21/2023  Discharge date: {dischdate:26783}  Discharge Physician: Alba Cory   PCP: Pcp, No   Recommendations at discharge:  {Tip this will not be part of the note when signed- Example include specific recommendations for outpatient follow-up, pending tests to follow-up on. (Optional):26781}  ***  Discharge Diagnoses: Principal Problem:   Sepsis (HCC) Active Problems:   Cellulitis of right lower extremity   Typical atrial flutter (HCC)  Resolved Problems:   * No resolved hospital problems. [facility] Course: No notes on file  Assessment and Plan: No notes have been filed under this hospital service. Service: Hospitalist     {Tip this will not be part of the note when signed Body mass index is 42.31 kg/m. , ,  (Optional):26781}  {(NOTE) Pain control PDMP Statment (Optional):26782} Consultants: *** Procedures performed: ***  Disposition: {Plan; Disposition:26390} Diet recommendation:  Discharge Diet Orders (From admission, onward)     Start     Ordered   01/29/23 0000  Diet - low sodium heart healthy        01/29/23 1053           {Diet_Plan:26776} DISCHARGE MEDICATION: Allergies as of 01/29/2023       Reactions   Fish-derived Products Anaphylaxis   Shellfish Allergy Anaphylaxis        Medication List     STOP taking these medications    Entresto 97-103 MG Generic drug: sacubitril-valsartan       TAKE these medications    apixaban 5 MG Tabs tablet Commonly known as: ELIQUIS Take 1 tablet (5 mg total) by mouth 2 (two) times daily.   atorvastatin 20 MG tablet Commonly known as: LIPITOR Take 1 tablet (20 mg total) by mouth at bedtime.   cefadroxil 500 MG capsule Commonly known as: DURICEF Take 1 capsule (500 mg total) by mouth 2 (two) times daily for 5 days.   diltiazem 360 MG 24 hr capsule Commonly known as: CARDIZEM  CD Take 1 capsule (360 mg total) by mouth daily. Start taking on: January 30, 2023   doxycycline 100 MG tablet Commonly known as: VIBRA-TABS Take 1 tablet (100 mg total) by mouth every 12 (twelve) hours for 5 days.   FreeStyle Libre 3 Sensor Misc 1 each by Does not apply route every 14 (fourteen) days.   furosemide 20 MG tablet Commonly known as: LASIX Take 2 tablets (40 mg total) by mouth daily.   isosorbide-hydrALAZINE 20-37.5 MG tablet Commonly known as: BIDIL Take 2 tablets by mouth 3 (three) times daily.   Jardiance 10 MG Tabs tablet Generic drug: empagliflozin Take 1 tablet by mouth once daily   metoprolol succinate 100 MG 24 hr tablet Commonly known as: TOPROL-XL Take 1 tablet (100 mg total) by mouth at bedtime. Take with or immediately following a meal.   Mounjaro 12.5 MG/0.5ML Pen Generic drug: tirzepatide Inject 12.5 mg into the skin once a week.   oxyCODONE 5 MG immediate release tablet Commonly known as: Oxy IR/ROXICODONE Take 1-2 tablets (5-10 mg total) by mouth every 6 (six) hours as needed for up to 4 days for moderate pain.               Discharge Care Instructions  (From admission, onward)           Start     Ordered   01/29/23 0000  Discharge wound care:  [birth year]       Patient Gender: M Patient Age:   42 years Exam Location:  [facility] Procedure:      VAS [place] LOWER EXTREMITY VENOUS (DVT) Referring Phys: Lyn Hollingshead SCHUTT --------------------------------------------------------------------------------  Indications: Pain.  Anticoagulation: Eliquis (afib). Limitations: Poor ultrasound/tissue interface. Comparison  Study: Previous exam on 09/14/2021 was negative for DVT. Performing Technologist: Ernestene Mention RVT, RDMS  Examination Guidelines: A complete evaluation includes B-mode imaging, spectral Doppler, color Doppler, and power Doppler as needed of all accessible portions of each vessel. Bilateral testing is considered an integral part of a complete examination. Limited examinations for reoccurring indications may be performed as noted. The reflux portion of the exam is performed with the patient in reverse Trendelenburg.  +---------+---------------+---------+-----------+----------+--------------+ RIGHT    CompressibilityPhasicitySpontaneityPropertiesThrombus Aging +---------+---------------+---------+-----------+----------+--------------+ CFV      Full           Yes      Yes                                 +---------+---------------+---------+-----------+----------+--------------+ SFJ      Full                                                        +---------+---------------+---------+-----------+----------+--------------+ FV Prox  Full           Yes      Yes                                 +---------+---------------+---------+-----------+----------+--------------+ FV Mid   Full           Yes      Yes                                 +---------+---------------+---------+-----------+----------+--------------+ FV DistalFull           Yes      Yes                                 +---------+---------------+---------+-----------+----------+--------------+ PFV      Full                                                        +---------+---------------+---------+-----------+----------+--------------+ POP      Full           Yes      Yes                                 +---------+---------------+---------+-----------+----------+--------------+ PTV      Full                                                         +---------+---------------+---------+-----------+----------+--------------+  Physician Discharge Summary   Patient: Randy [name] MRN: [id number] DOB: 01-06-1971  Admit date:     01/21/2023  Discharge date: {dischdate:26783}  Discharge Physician: Alba Cory   PCP: Pcp, No   Recommendations at discharge:  {Tip this will not be part of the note when signed- Example include specific recommendations for outpatient follow-up, pending tests to follow-up on. (Optional):26781}  ***  Discharge Diagnoses: Principal Problem:   Sepsis (HCC) Active Problems:   Cellulitis of right lower extremity   Typical atrial flutter (HCC)  Resolved Problems:   * No resolved hospital problems. [facility] Course: No notes on file  Assessment and Plan: No notes have been filed under this hospital service. Service: Hospitalist     {Tip this will not be part of the note when signed Body mass index is 42.31 kg/m. , ,  (Optional):26781}  {(NOTE) Pain control PDMP Statment (Optional):26782} Consultants: *** Procedures performed: ***  Disposition: {Plan; Disposition:26390} Diet recommendation:  Discharge Diet Orders (From admission, onward)     Start     Ordered   01/29/23 0000  Diet - low sodium heart healthy        01/29/23 1053           {Diet_Plan:26776} DISCHARGE MEDICATION: Allergies as of 01/29/2023       Reactions   Fish-derived Products Anaphylaxis   Shellfish Allergy Anaphylaxis        Medication List     STOP taking these medications    Entresto 97-103 MG Generic drug: sacubitril-valsartan       TAKE these medications    apixaban 5 MG Tabs tablet Commonly known as: ELIQUIS Take 1 tablet (5 mg total) by mouth 2 (two) times daily.   atorvastatin 20 MG tablet Commonly known as: LIPITOR Take 1 tablet (20 mg total) by mouth at bedtime.   cefadroxil 500 MG capsule Commonly known as: DURICEF Take 1 capsule (500 mg total) by mouth 2 (two) times daily for 5 days.   diltiazem 360 MG 24 hr capsule Commonly known as: CARDIZEM  CD Take 1 capsule (360 mg total) by mouth daily. Start taking on: January 30, 2023   doxycycline 100 MG tablet Commonly known as: VIBRA-TABS Take 1 tablet (100 mg total) by mouth every 12 (twelve) hours for 5 days.   FreeStyle Libre 3 Sensor Misc 1 each by Does not apply route every 14 (fourteen) days.   furosemide 20 MG tablet Commonly known as: LASIX Take 2 tablets (40 mg total) by mouth daily.   isosorbide-hydrALAZINE 20-37.5 MG tablet Commonly known as: BIDIL Take 2 tablets by mouth 3 (three) times daily.   Jardiance 10 MG Tabs tablet Generic drug: empagliflozin Take 1 tablet by mouth once daily   metoprolol succinate 100 MG 24 hr tablet Commonly known as: TOPROL-XL Take 1 tablet (100 mg total) by mouth at bedtime. Take with or immediately following a meal.   Mounjaro 12.5 MG/0.5ML Pen Generic drug: tirzepatide Inject 12.5 mg into the skin once a week.   oxyCODONE 5 MG immediate release tablet Commonly known as: Oxy IR/ROXICODONE Take 1-2 tablets (5-10 mg total) by mouth every 6 (six) hours as needed for up to 4 days for moderate pain.               Discharge Care Instructions  (From admission, onward)           Start     Ordered   01/29/23 0000  Discharge wound care:  Physician Discharge Summary   Patient: Randy [name] MRN: [id number] DOB: 01-06-1971  Admit date:     01/21/2023  Discharge date: {dischdate:26783}  Discharge Physician: Alba Cory   PCP: Pcp, No   Recommendations at discharge:  {Tip this will not be part of the note when signed- Example include specific recommendations for outpatient follow-up, pending tests to follow-up on. (Optional):26781}  ***  Discharge Diagnoses: Principal Problem:   Sepsis (HCC) Active Problems:   Cellulitis of right lower extremity   Typical atrial flutter (HCC)  Resolved Problems:   * No resolved hospital problems. [facility] Course: No notes on file  Assessment and Plan: No notes have been filed under this hospital service. Service: Hospitalist     {Tip this will not be part of the note when signed Body mass index is 42.31 kg/m. , ,  (Optional):26781}  {(NOTE) Pain control PDMP Statment (Optional):26782} Consultants: *** Procedures performed: ***  Disposition: {Plan; Disposition:26390} Diet recommendation:  Discharge Diet Orders (From admission, onward)     Start     Ordered   01/29/23 0000  Diet - low sodium heart healthy        01/29/23 1053           {Diet_Plan:26776} DISCHARGE MEDICATION: Allergies as of 01/29/2023       Reactions   Fish-derived Products Anaphylaxis   Shellfish Allergy Anaphylaxis        Medication List     STOP taking these medications    Entresto 97-103 MG Generic drug: sacubitril-valsartan       TAKE these medications    apixaban 5 MG Tabs tablet Commonly known as: ELIQUIS Take 1 tablet (5 mg total) by mouth 2 (two) times daily.   atorvastatin 20 MG tablet Commonly known as: LIPITOR Take 1 tablet (20 mg total) by mouth at bedtime.   cefadroxil 500 MG capsule Commonly known as: DURICEF Take 1 capsule (500 mg total) by mouth 2 (two) times daily for 5 days.   diltiazem 360 MG 24 hr capsule Commonly known as: CARDIZEM  CD Take 1 capsule (360 mg total) by mouth daily. Start taking on: January 30, 2023   doxycycline 100 MG tablet Commonly known as: VIBRA-TABS Take 1 tablet (100 mg total) by mouth every 12 (twelve) hours for 5 days.   FreeStyle Libre 3 Sensor Misc 1 each by Does not apply route every 14 (fourteen) days.   furosemide 20 MG tablet Commonly known as: LASIX Take 2 tablets (40 mg total) by mouth daily.   isosorbide-hydrALAZINE 20-37.5 MG tablet Commonly known as: BIDIL Take 2 tablets by mouth 3 (three) times daily.   Jardiance 10 MG Tabs tablet Generic drug: empagliflozin Take 1 tablet by mouth once daily   metoprolol succinate 100 MG 24 hr tablet Commonly known as: TOPROL-XL Take 1 tablet (100 mg total) by mouth at bedtime. Take with or immediately following a meal.   Mounjaro 12.5 MG/0.5ML Pen Generic drug: tirzepatide Inject 12.5 mg into the skin once a week.   oxyCODONE 5 MG immediate release tablet Commonly known as: Oxy IR/ROXICODONE Take 1-2 tablets (5-10 mg total) by mouth every 6 (six) hours as needed for up to 4 days for moderate pain.               Discharge Care Instructions  (From admission, onward)           Start     Ordered   01/29/23 0000  Discharge wound care:

## 2023-01-31 NOTE — Discharge Summary (Signed)
is no evidence of deep vein thrombosis in the lower extremity.  - No cystic structure found in the popliteal fossa. - Ultrasound characteristics of enlarged lymph nodes are noted in the groin and popliteal fossa. - Subcutaneous fluid noted in area of calf and ankle.  LEFT: - No evidence of common femoral vein obstruction.   *See table(s) above for measurements and observations. Electronically signed by Heath Lark on 01/21/2023 at 5:09:29 PM.    Final    ECHOCARDIOGRAM COMPLETE  Result Date: 01/15/2023    ECHOCARDIOGRAM  REPORT   Patient Name:   Randy [name] Date of Exam: 01/15/2023 Medical Rec #:  [id number]      Height:       80.0 in Accession #:    [id number]     Weight:       375.4 lb Date of Birth:  1971/03/16      BSA:          3.005 m Patient Age:    51 years       BP:           144/100 mmHg Patient Gender: M              HR:           82 bpm. Exam Location:  [address] Procedure: 2D Echo, Cardiac Doppler and Color Doppler Indications:    I48.19 Atrial fibrillation  History:        Patient has prior history of Echocardiogram examinations, most                 recent 04/03/2022. CHF, Stroke and Dilated aortic root,                 Arrythmias:Atrial Fibrillation; Risk Factors:Hypertension and                 Diabetes.  Sonographer:    Samule Ohm RDCS Referring Phys: [id number] WILL MARTIN CAMNITZ IMPRESSIONS  1. Left ventricular ejection fraction, by estimation, is 50 to 55%. The left ventricle has low normal function. The left ventricle has no regional wall motion abnormalities. The left ventricular internal cavity size was mildly dilated. There is severe concentric left ventricular hypertrophy. Left ventricular diastolic function could not be evaluated.  2. Right ventricular systolic function is mildly reduced. The right ventricular size is mildly enlarged. There is mildly elevated pulmonary artery systolic pressure. The estimated right ventricular systolic pressure is 34.4 mmHg.  3. Left atrial size was severely dilated.  4. Right atrial size was mildly dilated.  5. The mitral valve is normal in structure. No evidence of mitral valve regurgitation. No evidence of mitral stenosis.  6. The aortic valve is tricuspid. Aortic valve regurgitation is not visualized. No aortic stenosis is present.  7. There is mild dilatation of the ascending aorta, measuring 42 mm.  8. The inferior vena cava is dilated in size with >50% respiratory variability, suggesting right atrial pressure of 8 mmHg. Comparison(s): A prior study was  performed on TEE 10/02/2022. Prior images reviewed side by side. The left ventricular function has improved. The right ventricular hypertrophy has improved. FINDINGS  Left Ventricle: Left ventricular ejection fraction, by estimation, is 50 to 55%. The left ventricle has low normal function. The left ventricle has no regional wall motion abnormalities. The left ventricular internal cavity size was mildly dilated. There is severe concentric left ventricular hypertrophy. Left ventricular diastolic function could not be evaluated due to atrial fibrillation. Left ventricular diastolic function could not  Physician Discharge Summary   Patient: Randy [name] MRN: [id number] DOB: Jul 12, 1970  Admit date:     01/21/2023  Discharge date: 01/29/2023  Discharge Physician: Jon Billings A Vanden Fawaz   PCP: Pcp, No   Recommendations at discharge:   Needs Bmet, if renal function stable, resume Entresto.  Follow up on resolution of Cellulitis LE Follow up with cardiology for further adjustment meds for A flutter  Discharge Diagnoses: Principal Problem:   Sepsis (HCC) Active Problems:   Cellulitis of right lower extremity   Typical atrial flutter (HCC)  Resolved Problems:   * No resolved hospital problems. 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  He has a chronic wound involving the right foot and an ulcer on the right second toe.  He reports rigors.  He was found to have right lower extremity cellulitis and sepsis and lactic acidosis.  A-fib with RVR.  Cardiology assisting with management of A-fib. He was evaluated by Dr. Lajoyce Corners for cellulitis and concern of osteomyelitis of lower extremity.  Dr. Lajoyce Corners does not think the patient has active osteomyelitis.      Assessment and Plan: 1-Right lower extremity cellulitis/sepsis present on admission/lactic acidosis -Patient presents with fever, leukocytosis, tachycardia and tachypnea, source of infection subsequently identified, right lower extremity cellulitis. -CT of the right thigh only showed lymphadenopathy no abscess. -MRI was obtained which showed multiple findings raising concern for osteomyelitis.  Seen by Dr. Lajoyce Corners who does not feel patient has clinically osteomyelitis. -Subsequently developed erythema of the right lower leg.  CT of the right lower leg suggested cellulitis no  abscess. -Initially treated with vancomycin cefepime and metronidazole, vancomycin discontinued due to worsening renal function and MRSA PCR negative. -He was transitioned to oral antibiotics cefadroxil and doxycycline on 10/3. -Patient report improvement of redness. -Dopplers negative for DVT -WBC trending down. Redness improving.  -Wound care  stable for discharge, on 5 more days antibiotics.   2-Chronic systolic heart failure Last echo done earlier this month showed improvement of ejection fraction to 50%. Lasix on hold due to sepsis while inpatient. Plan to resume lasix at discharge.  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  AKI in the setting of sepsis and mild hypovolemia.  Creatinine peaked to 2.5.  Vancomycin was discontinued.  He received IV fluids. Continue to hold diuretics.  Continue to monitor renal function Renal function stable.    4-Hyponatremia and hypokalemia: Replaced monitor   A-fib flutter RVR: Appreciate cardiology assistance with metoprolol and Eliquis.  Started Cardizem 10/02   Mild transaminases: Improving.  In the setting of sepsis Hepatitis C antibody pending   History of CVA: Continue with the statins and Eliquis   Diabetes type 2: Continue to hold Mounjaro and Jardiance.  Sliding scale insulin   Hiccup; change PPI to BID. PRN reglan Morbid obesity: Need lifestyle modification HTN, uncontrolled. Added  Norvasc.    Estimated body mass index is 42.31 kg/m as calculated from the following:   Height as of this encounter: 6\' 8"  (2.032 m).   Weight as of this encounter: 174.7 kg.          Consultants: none  Procedures performed: none  Disposition: Home Diet recommendation:  Discharge Diet Orders (  signal characteristics measuring about 2.7 by 2.0 cm, with erosive findings along the proximal margin of the distal fragment, and edema in the proximal shaft of the second metatarsal. The possibility of chronic osteomyelitis along this nonunion site is raised. Erosive sterile granulomatous response is also a possibility. 2. Extensive adjacent intermediate T1 signal in the soft tissues along the resected first ray, probably granulomatous tissue. 3. Edema in the distal phalanx of the second toe, osteomyelitis is a  distinct possibility. 4. Abnormal band of intermediate T1 signal in the soft tissues along the medial foot along the resected first ray measuring about 10.7 by 2.8 by 5.4 cm, probably representing granulation tissue. 5. Dorsal subcutaneous edema in the forefoot extends into the second and third toes. 6. Moderate muscular atrophy and low-level generalized muscular edema which is probably neurogenic. 7. Deformities of the third and fourth metatarsals due to callus and old fracture. 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 Electronically Signed   By: Gaylyn Rong M.D.   On: 01/22/2023 08:49   DG CHEST PORT 1 VIEW  Result Date: 01/22/2023 CLINICAL DATA:  Sepsis, leg pain. EXAM: PORTABLE CHEST 1 VIEW COMPARISON:  December 24, 2022 FINDINGS: There is stable mild to moderate severity cardiac silhouette enlargement. Mild prominence of the pulmonary vasculature is seen. Low lung volumes are noted. There is no evidence of focal consolidation, pleural effusion or pneumothorax. The visualized skeletal structures are unremarkable. IMPRESSION: Stable cardiomegaly with mild pulmonary vascular congestion. Electronically Signed   By: Aram Candela M.D.   On: 01/22/2023 01:53   DG Foot Complete Right  Result Date: 01/21/2023 CLINICAL DATA:  2nd toe ulcer EXAM: RIGHT FOOT COMPLETE - 3+ VIEW COMPARISON:  09/14/2021. FINDINGS: Bone mineralization within normal limits for patient's age. Since the prior study, there is amputation of great toe. There is malunited fracture deformity of the second through fourth metatarsal. There are marked asymmetric degenerative changes of the second tarsometatarsal joint. No significant arthritis of other imaged joints. Note is made of pes planus deformity. No acute fracture or dislocation. No aggressive osseous lesion. No focal erosion noted along the tip of second toe. No evidence of air within the soft tissue. Ankle mortise appears intact. No radiopaque  foreign bodies. IMPRESSION: *No radiographic evidence of osteomyelitis. If there is continued clinical concern, MRI is recommended. *Multiple other nonacute observations, as described above. Electronically Signed   By: Jules Schick M.D.   On: 01/21/2023 17:56   VAS [place] LOWER EXTREMITY VENOUS (DVT) (7a-7p)  Result Date: 01/21/2023  Lower Venous DVT Study Patient Name:  Randy [name]  Date of Exam:   01/21/2023 Medical Rec #: [id number]       Accession #:    [id number] Date of Birth: [birth year]       Patient Gender: M Patient Age:   41 years Exam Location:  [facility] Procedure:      VAS [place] LOWER EXTREMITY VENOUS (DVT) Referring Phys: Lyn Hollingshead SCHUTT --------------------------------------------------------------------------------  Indications: Pain.  Anticoagulation: Eliquis (afib). Limitations: Poor ultrasound/tissue interface. Comparison Study: Previous exam on 09/14/2021 was negative for DVT. Performing Technologist: Ernestene Mention RVT, RDMS  Examination Guidelines: A complete evaluation includes B-mode imaging, spectral Doppler, color Doppler, and power Doppler as needed of all accessible portions of each vessel. Bilateral testing is considered an integral part of a complete examination. Limited examinations for reoccurring indications may be performed as noted. The reflux portion of the exam is performed with the patient in reverse Trendelenburg.  +---------+---------------+---------+-----------+----------+--------------+  Physician Discharge Summary   Patient: Randy [name] MRN: [id number] DOB: Jul 12, 1970  Admit date:     01/21/2023  Discharge date: 01/29/2023  Discharge Physician: Jon Billings A Vanden Fawaz   PCP: Pcp, No   Recommendations at discharge:   Needs Bmet, if renal function stable, resume Entresto.  Follow up on resolution of Cellulitis LE Follow up with cardiology for further adjustment meds for A flutter  Discharge Diagnoses: Principal Problem:   Sepsis (HCC) Active Problems:   Cellulitis of right lower extremity   Typical atrial flutter (HCC)  Resolved Problems:   * No resolved hospital problems. 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  He has a chronic wound involving the right foot and an ulcer on the right second toe.  He reports rigors.  He was found to have right lower extremity cellulitis and sepsis and lactic acidosis.  A-fib with RVR.  Cardiology assisting with management of A-fib. He was evaluated by Dr. Lajoyce Corners for cellulitis and concern of osteomyelitis of lower extremity.  Dr. Lajoyce Corners does not think the patient has active osteomyelitis.      Assessment and Plan: 1-Right lower extremity cellulitis/sepsis present on admission/lactic acidosis -Patient presents with fever, leukocytosis, tachycardia and tachypnea, source of infection subsequently identified, right lower extremity cellulitis. -CT of the right thigh only showed lymphadenopathy no abscess. -MRI was obtained which showed multiple findings raising concern for osteomyelitis.  Seen by Dr. Lajoyce Corners who does not feel patient has clinically osteomyelitis. -Subsequently developed erythema of the right lower leg.  CT of the right lower leg suggested cellulitis no  abscess. -Initially treated with vancomycin cefepime and metronidazole, vancomycin discontinued due to worsening renal function and MRSA PCR negative. -He was transitioned to oral antibiotics cefadroxil and doxycycline on 10/3. -Patient report improvement of redness. -Dopplers negative for DVT -WBC trending down. Redness improving.  -Wound care  stable for discharge, on 5 more days antibiotics.   2-Chronic systolic heart failure Last echo done earlier this month showed improvement of ejection fraction to 50%. Lasix on hold due to sepsis while inpatient. Plan to resume lasix at discharge.  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  AKI in the setting of sepsis and mild hypovolemia.  Creatinine peaked to 2.5.  Vancomycin was discontinued.  He received IV fluids. Continue to hold diuretics.  Continue to monitor renal function Renal function stable.    4-Hyponatremia and hypokalemia: Replaced monitor   A-fib flutter RVR: Appreciate cardiology assistance with metoprolol and Eliquis.  Started Cardizem 10/02   Mild transaminases: Improving.  In the setting of sepsis Hepatitis C antibody pending   History of CVA: Continue with the statins and Eliquis   Diabetes type 2: Continue to hold Mounjaro and Jardiance.  Sliding scale insulin   Hiccup; change PPI to BID. PRN reglan Morbid obesity: Need lifestyle modification HTN, uncontrolled. Added  Norvasc.    Estimated body mass index is 42.31 kg/m as calculated from the following:   Height as of this encounter: 6\' 8"  (2.032 m).   Weight as of this encounter: 174.7 kg.          Consultants: none  Procedures performed: none  Disposition: Home Diet recommendation:  Discharge Diet Orders (  signal characteristics measuring about 2.7 by 2.0 cm, with erosive findings along the proximal margin of the distal fragment, and edema in the proximal shaft of the second metatarsal. The possibility of chronic osteomyelitis along this nonunion site is raised. Erosive sterile granulomatous response is also a possibility. 2. Extensive adjacent intermediate T1 signal in the soft tissues along the resected first ray, probably granulomatous tissue. 3. Edema in the distal phalanx of the second toe, osteomyelitis is a  distinct possibility. 4. Abnormal band of intermediate T1 signal in the soft tissues along the medial foot along the resected first ray measuring about 10.7 by 2.8 by 5.4 cm, probably representing granulation tissue. 5. Dorsal subcutaneous edema in the forefoot extends into the second and third toes. 6. Moderate muscular atrophy and low-level generalized muscular edema which is probably neurogenic. 7. Deformities of the third and fourth metatarsals due to callus and old fracture. 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 Electronically Signed   By: Gaylyn Rong M.D.   On: 01/22/2023 08:49   DG CHEST PORT 1 VIEW  Result Date: 01/22/2023 CLINICAL DATA:  Sepsis, leg pain. EXAM: PORTABLE CHEST 1 VIEW COMPARISON:  December 24, 2022 FINDINGS: There is stable mild to moderate severity cardiac silhouette enlargement. Mild prominence of the pulmonary vasculature is seen. Low lung volumes are noted. There is no evidence of focal consolidation, pleural effusion or pneumothorax. The visualized skeletal structures are unremarkable. IMPRESSION: Stable cardiomegaly with mild pulmonary vascular congestion. Electronically Signed   By: Aram Candela M.D.   On: 01/22/2023 01:53   DG Foot Complete Right  Result Date: 01/21/2023 CLINICAL DATA:  2nd toe ulcer EXAM: RIGHT FOOT COMPLETE - 3+ VIEW COMPARISON:  09/14/2021. FINDINGS: Bone mineralization within normal limits for patient's age. Since the prior study, there is amputation of great toe. There is malunited fracture deformity of the second through fourth metatarsal. There are marked asymmetric degenerative changes of the second tarsometatarsal joint. No significant arthritis of other imaged joints. Note is made of pes planus deformity. No acute fracture or dislocation. No aggressive osseous lesion. No focal erosion noted along the tip of second toe. No evidence of air within the soft tissue. Ankle mortise appears intact. No radiopaque  foreign bodies. IMPRESSION: *No radiographic evidence of osteomyelitis. If there is continued clinical concern, MRI is recommended. *Multiple other nonacute observations, as described above. Electronically Signed   By: Jules Schick M.D.   On: 01/21/2023 17:56   VAS [place] LOWER EXTREMITY VENOUS (DVT) (7a-7p)  Result Date: 01/21/2023  Lower Venous DVT Study Patient Name:  Randy [name]  Date of Exam:   01/21/2023 Medical Rec #: [id number]       Accession #:    [id number] Date of Birth: [birth year]       Patient Gender: M Patient Age:   41 years Exam Location:  [facility] Procedure:      VAS [place] LOWER EXTREMITY VENOUS (DVT) Referring Phys: Lyn Hollingshead SCHUTT --------------------------------------------------------------------------------  Indications: Pain.  Anticoagulation: Eliquis (afib). Limitations: Poor ultrasound/tissue interface. Comparison Study: Previous exam on 09/14/2021 was negative for DVT. Performing Technologist: Ernestene Mention RVT, RDMS  Examination Guidelines: A complete evaluation includes B-mode imaging, spectral Doppler, color Doppler, and power Doppler as needed of all accessible portions of each vessel. Bilateral testing is considered an integral part of a complete examination. Limited examinations for reoccurring indications may be performed as noted. The reflux portion of the exam is performed with the patient in reverse Trendelenburg.  +---------+---------------+---------+-----------+----------+--------------+  signal characteristics measuring about 2.7 by 2.0 cm, with erosive findings along the proximal margin of the distal fragment, and edema in the proximal shaft of the second metatarsal. The possibility of chronic osteomyelitis along this nonunion site is raised. Erosive sterile granulomatous response is also a possibility. 2. Extensive adjacent intermediate T1 signal in the soft tissues along the resected first ray, probably granulomatous tissue. 3. Edema in the distal phalanx of the second toe, osteomyelitis is a  distinct possibility. 4. Abnormal band of intermediate T1 signal in the soft tissues along the medial foot along the resected first ray measuring about 10.7 by 2.8 by 5.4 cm, probably representing granulation tissue. 5. Dorsal subcutaneous edema in the forefoot extends into the second and third toes. 6. Moderate muscular atrophy and low-level generalized muscular edema which is probably neurogenic. 7. Deformities of the third and fourth metatarsals due to callus and old fracture. 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 Electronically Signed   By: Gaylyn Rong M.D.   On: 01/22/2023 08:49   DG CHEST PORT 1 VIEW  Result Date: 01/22/2023 CLINICAL DATA:  Sepsis, leg pain. EXAM: PORTABLE CHEST 1 VIEW COMPARISON:  December 24, 2022 FINDINGS: There is stable mild to moderate severity cardiac silhouette enlargement. Mild prominence of the pulmonary vasculature is seen. Low lung volumes are noted. There is no evidence of focal consolidation, pleural effusion or pneumothorax. The visualized skeletal structures are unremarkable. IMPRESSION: Stable cardiomegaly with mild pulmonary vascular congestion. Electronically Signed   By: Aram Candela M.D.   On: 01/22/2023 01:53   DG Foot Complete Right  Result Date: 01/21/2023 CLINICAL DATA:  2nd toe ulcer EXAM: RIGHT FOOT COMPLETE - 3+ VIEW COMPARISON:  09/14/2021. FINDINGS: Bone mineralization within normal limits for patient's age. Since the prior study, there is amputation of great toe. There is malunited fracture deformity of the second through fourth metatarsal. There are marked asymmetric degenerative changes of the second tarsometatarsal joint. No significant arthritis of other imaged joints. Note is made of pes planus deformity. No acute fracture or dislocation. No aggressive osseous lesion. No focal erosion noted along the tip of second toe. No evidence of air within the soft tissue. Ankle mortise appears intact. No radiopaque  foreign bodies. IMPRESSION: *No radiographic evidence of osteomyelitis. If there is continued clinical concern, MRI is recommended. *Multiple other nonacute observations, as described above. Electronically Signed   By: Jules Schick M.D.   On: 01/21/2023 17:56   VAS [place] LOWER EXTREMITY VENOUS (DVT) (7a-7p)  Result Date: 01/21/2023  Lower Venous DVT Study Patient Name:  Randy [name]  Date of Exam:   01/21/2023 Medical Rec #: [id number]       Accession #:    [id number] Date of Birth: [birth year]       Patient Gender: M Patient Age:   41 years Exam Location:  [facility] Procedure:      VAS [place] LOWER EXTREMITY VENOUS (DVT) Referring Phys: Lyn Hollingshead SCHUTT --------------------------------------------------------------------------------  Indications: Pain.  Anticoagulation: Eliquis (afib). Limitations: Poor ultrasound/tissue interface. Comparison Study: Previous exam on 09/14/2021 was negative for DVT. Performing Technologist: Ernestene Mention RVT, RDMS  Examination Guidelines: A complete evaluation includes B-mode imaging, spectral Doppler, color Doppler, and power Doppler as needed of all accessible portions of each vessel. Bilateral testing is considered an integral part of a complete examination. Limited examinations for reoccurring indications may be performed as noted. The reflux portion of the exam is performed with the patient in reverse Trendelenburg.  +---------+---------------+---------+-----------+----------+--------------+  Physician Discharge Summary   Patient: Randy [name] MRN: [id number] DOB: Jul 12, 1970  Admit date:     01/21/2023  Discharge date: 01/29/2023  Discharge Physician: Jon Billings A Vanden Fawaz   PCP: Pcp, No   Recommendations at discharge:   Needs Bmet, if renal function stable, resume Entresto.  Follow up on resolution of Cellulitis LE Follow up with cardiology for further adjustment meds for A flutter  Discharge Diagnoses: Principal Problem:   Sepsis (HCC) Active Problems:   Cellulitis of right lower extremity   Typical atrial flutter (HCC)  Resolved Problems:   * No resolved hospital problems. 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  He has a chronic wound involving the right foot and an ulcer on the right second toe.  He reports rigors.  He was found to have right lower extremity cellulitis and sepsis and lactic acidosis.  A-fib with RVR.  Cardiology assisting with management of A-fib. He was evaluated by Dr. Lajoyce Corners for cellulitis and concern of osteomyelitis of lower extremity.  Dr. Lajoyce Corners does not think the patient has active osteomyelitis.      Assessment and Plan: 1-Right lower extremity cellulitis/sepsis present on admission/lactic acidosis -Patient presents with fever, leukocytosis, tachycardia and tachypnea, source of infection subsequently identified, right lower extremity cellulitis. -CT of the right thigh only showed lymphadenopathy no abscess. -MRI was obtained which showed multiple findings raising concern for osteomyelitis.  Seen by Dr. Lajoyce Corners who does not feel patient has clinically osteomyelitis. -Subsequently developed erythema of the right lower leg.  CT of the right lower leg suggested cellulitis no  abscess. -Initially treated with vancomycin cefepime and metronidazole, vancomycin discontinued due to worsening renal function and MRSA PCR negative. -He was transitioned to oral antibiotics cefadroxil and doxycycline on 10/3. -Patient report improvement of redness. -Dopplers negative for DVT -WBC trending down. Redness improving.  -Wound care  stable for discharge, on 5 more days antibiotics.   2-Chronic systolic heart failure Last echo done earlier this month showed improvement of ejection fraction to 50%. Lasix on hold due to sepsis while inpatient. Plan to resume lasix at discharge.  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  AKI in the setting of sepsis and mild hypovolemia.  Creatinine peaked to 2.5.  Vancomycin was discontinued.  He received IV fluids. Continue to hold diuretics.  Continue to monitor renal function Renal function stable.    4-Hyponatremia and hypokalemia: Replaced monitor   A-fib flutter RVR: Appreciate cardiology assistance with metoprolol and Eliquis.  Started Cardizem 10/02   Mild transaminases: Improving.  In the setting of sepsis Hepatitis C antibody pending   History of CVA: Continue with the statins and Eliquis   Diabetes type 2: Continue to hold Mounjaro and Jardiance.  Sliding scale insulin   Hiccup; change PPI to BID. PRN reglan Morbid obesity: Need lifestyle modification HTN, uncontrolled. Added  Norvasc.    Estimated body mass index is 42.31 kg/m as calculated from the following:   Height as of this encounter: 6\' 8"  (2.032 m).   Weight as of this encounter: 174.7 kg.          Consultants: none  Procedures performed: none  Disposition: Home Diet recommendation:  Discharge Diet Orders (  signal characteristics measuring about 2.7 by 2.0 cm, with erosive findings along the proximal margin of the distal fragment, and edema in the proximal shaft of the second metatarsal. The possibility of chronic osteomyelitis along this nonunion site is raised. Erosive sterile granulomatous response is also a possibility. 2. Extensive adjacent intermediate T1 signal in the soft tissues along the resected first ray, probably granulomatous tissue. 3. Edema in the distal phalanx of the second toe, osteomyelitis is a  distinct possibility. 4. Abnormal band of intermediate T1 signal in the soft tissues along the medial foot along the resected first ray measuring about 10.7 by 2.8 by 5.4 cm, probably representing granulation tissue. 5. Dorsal subcutaneous edema in the forefoot extends into the second and third toes. 6. Moderate muscular atrophy and low-level generalized muscular edema which is probably neurogenic. 7. Deformities of the third and fourth metatarsals due to callus and old fracture. 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 Electronically Signed   By: Gaylyn Rong M.D.   On: 01/22/2023 08:49   DG CHEST PORT 1 VIEW  Result Date: 01/22/2023 CLINICAL DATA:  Sepsis, leg pain. EXAM: PORTABLE CHEST 1 VIEW COMPARISON:  December 24, 2022 FINDINGS: There is stable mild to moderate severity cardiac silhouette enlargement. Mild prominence of the pulmonary vasculature is seen. Low lung volumes are noted. There is no evidence of focal consolidation, pleural effusion or pneumothorax. The visualized skeletal structures are unremarkable. IMPRESSION: Stable cardiomegaly with mild pulmonary vascular congestion. Electronically Signed   By: Aram Candela M.D.   On: 01/22/2023 01:53   DG Foot Complete Right  Result Date: 01/21/2023 CLINICAL DATA:  2nd toe ulcer EXAM: RIGHT FOOT COMPLETE - 3+ VIEW COMPARISON:  09/14/2021. FINDINGS: Bone mineralization within normal limits for patient's age. Since the prior study, there is amputation of great toe. There is malunited fracture deformity of the second through fourth metatarsal. There are marked asymmetric degenerative changes of the second tarsometatarsal joint. No significant arthritis of other imaged joints. Note is made of pes planus deformity. No acute fracture or dislocation. No aggressive osseous lesion. No focal erosion noted along the tip of second toe. No evidence of air within the soft tissue. Ankle mortise appears intact. No radiopaque  foreign bodies. IMPRESSION: *No radiographic evidence of osteomyelitis. If there is continued clinical concern, MRI is recommended. *Multiple other nonacute observations, as described above. Electronically Signed   By: Jules Schick M.D.   On: 01/21/2023 17:56   VAS [place] LOWER EXTREMITY VENOUS (DVT) (7a-7p)  Result Date: 01/21/2023  Lower Venous DVT Study Patient Name:  Randy [name]  Date of Exam:   01/21/2023 Medical Rec #: [id number]       Accession #:    [id number] Date of Birth: [birth year]       Patient Gender: M Patient Age:   41 years Exam Location:  [facility] Procedure:      VAS [place] LOWER EXTREMITY VENOUS (DVT) Referring Phys: Lyn Hollingshead SCHUTT --------------------------------------------------------------------------------  Indications: Pain.  Anticoagulation: Eliquis (afib). Limitations: Poor ultrasound/tissue interface. Comparison Study: Previous exam on 09/14/2021 was negative for DVT. Performing Technologist: Ernestene Mention RVT, RDMS  Examination Guidelines: A complete evaluation includes B-mode imaging, spectral Doppler, color Doppler, and power Doppler as needed of all accessible portions of each vessel. Bilateral testing is considered an integral part of a complete examination. Limited examinations for reoccurring indications may be performed as noted. The reflux portion of the exam is performed with the patient in reverse Trendelenburg.  +---------+---------------+---------+-----------+----------+--------------+  Physician Discharge Summary   Patient: Randy [name] MRN: [id number] DOB: Jul 12, 1970  Admit date:     01/21/2023  Discharge date: 01/29/2023  Discharge Physician: Jon Billings A Vanden Fawaz   PCP: Pcp, No   Recommendations at discharge:   Needs Bmet, if renal function stable, resume Entresto.  Follow up on resolution of Cellulitis LE Follow up with cardiology for further adjustment meds for A flutter  Discharge Diagnoses: Principal Problem:   Sepsis (HCC) Active Problems:   Cellulitis of right lower extremity   Typical atrial flutter (HCC)  Resolved Problems:   * No resolved hospital problems. 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  He has a chronic wound involving the right foot and an ulcer on the right second toe.  He reports rigors.  He was found to have right lower extremity cellulitis and sepsis and lactic acidosis.  A-fib with RVR.  Cardiology assisting with management of A-fib. He was evaluated by Dr. Lajoyce Corners for cellulitis and concern of osteomyelitis of lower extremity.  Dr. Lajoyce Corners does not think the patient has active osteomyelitis.      Assessment and Plan: 1-Right lower extremity cellulitis/sepsis present on admission/lactic acidosis -Patient presents with fever, leukocytosis, tachycardia and tachypnea, source of infection subsequently identified, right lower extremity cellulitis. -CT of the right thigh only showed lymphadenopathy no abscess. -MRI was obtained which showed multiple findings raising concern for osteomyelitis.  Seen by Dr. Lajoyce Corners who does not feel patient has clinically osteomyelitis. -Subsequently developed erythema of the right lower leg.  CT of the right lower leg suggested cellulitis no  abscess. -Initially treated with vancomycin cefepime and metronidazole, vancomycin discontinued due to worsening renal function and MRSA PCR negative. -He was transitioned to oral antibiotics cefadroxil and doxycycline on 10/3. -Patient report improvement of redness. -Dopplers negative for DVT -WBC trending down. Redness improving.  -Wound care  stable for discharge, on 5 more days antibiotics.   2-Chronic systolic heart failure Last echo done earlier this month showed improvement of ejection fraction to 50%. Lasix on hold due to sepsis while inpatient. Plan to resume lasix at discharge.  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  AKI in the setting of sepsis and mild hypovolemia.  Creatinine peaked to 2.5.  Vancomycin was discontinued.  He received IV fluids. Continue to hold diuretics.  Continue to monitor renal function Renal function stable.    4-Hyponatremia and hypokalemia: Replaced monitor   A-fib flutter RVR: Appreciate cardiology assistance with metoprolol and Eliquis.  Started Cardizem 10/02   Mild transaminases: Improving.  In the setting of sepsis Hepatitis C antibody pending   History of CVA: Continue with the statins and Eliquis   Diabetes type 2: Continue to hold Mounjaro and Jardiance.  Sliding scale insulin   Hiccup; change PPI to BID. PRN reglan Morbid obesity: Need lifestyle modification HTN, uncontrolled. Added  Norvasc.    Estimated body mass index is 42.31 kg/m as calculated from the following:   Height as of this encounter: 6\' 8"  (2.032 m).   Weight as of this encounter: 174.7 kg.          Consultants: none  Procedures performed: none  Disposition: Home Diet recommendation:  Discharge Diet Orders (  Physician Discharge Summary   Patient: Randy [name] MRN: [id number] DOB: Jul 12, 1970  Admit date:     01/21/2023  Discharge date: 01/29/2023  Discharge Physician: Jon Billings A Vanden Fawaz   PCP: Pcp, No   Recommendations at discharge:   Needs Bmet, if renal function stable, resume Entresto.  Follow up on resolution of Cellulitis LE Follow up with cardiology for further adjustment meds for A flutter  Discharge Diagnoses: Principal Problem:   Sepsis (HCC) Active Problems:   Cellulitis of right lower extremity   Typical atrial flutter (HCC)  Resolved Problems:   * No resolved hospital problems. 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  He has a chronic wound involving the right foot and an ulcer on the right second toe.  He reports rigors.  He was found to have right lower extremity cellulitis and sepsis and lactic acidosis.  A-fib with RVR.  Cardiology assisting with management of A-fib. He was evaluated by Dr. Lajoyce Corners for cellulitis and concern of osteomyelitis of lower extremity.  Dr. Lajoyce Corners does not think the patient has active osteomyelitis.      Assessment and Plan: 1-Right lower extremity cellulitis/sepsis present on admission/lactic acidosis -Patient presents with fever, leukocytosis, tachycardia and tachypnea, source of infection subsequently identified, right lower extremity cellulitis. -CT of the right thigh only showed lymphadenopathy no abscess. -MRI was obtained which showed multiple findings raising concern for osteomyelitis.  Seen by Dr. Lajoyce Corners who does not feel patient has clinically osteomyelitis. -Subsequently developed erythema of the right lower leg.  CT of the right lower leg suggested cellulitis no  abscess. -Initially treated with vancomycin cefepime and metronidazole, vancomycin discontinued due to worsening renal function and MRSA PCR negative. -He was transitioned to oral antibiotics cefadroxil and doxycycline on 10/3. -Patient report improvement of redness. -Dopplers negative for DVT -WBC trending down. Redness improving.  -Wound care  stable for discharge, on 5 more days antibiotics.   2-Chronic systolic heart failure Last echo done earlier this month showed improvement of ejection fraction to 50%. Lasix on hold due to sepsis while inpatient. Plan to resume lasix at discharge.  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  AKI in the setting of sepsis and mild hypovolemia.  Creatinine peaked to 2.5.  Vancomycin was discontinued.  He received IV fluids. Continue to hold diuretics.  Continue to monitor renal function Renal function stable.    4-Hyponatremia and hypokalemia: Replaced monitor   A-fib flutter RVR: Appreciate cardiology assistance with metoprolol and Eliquis.  Started Cardizem 10/02   Mild transaminases: Improving.  In the setting of sepsis Hepatitis C antibody pending   History of CVA: Continue with the statins and Eliquis   Diabetes type 2: Continue to hold Mounjaro and Jardiance.  Sliding scale insulin   Hiccup; change PPI to BID. PRN reglan Morbid obesity: Need lifestyle modification HTN, uncontrolled. Added  Norvasc.    Estimated body mass index is 42.31 kg/m as calculated from the following:   Height as of this encounter: 6\' 8"  (2.032 m).   Weight as of this encounter: 174.7 kg.          Consultants: none  Procedures performed: none  Disposition: Home Diet recommendation:  Discharge Diet Orders (

## 2023-02-01 ENCOUNTER — Other Ambulatory Visit (HOSPITAL_COMMUNITY): Payer: Self-pay

## 2023-02-03 ENCOUNTER — Ambulatory Visit: Payer: 59 | Admitting: Family

## 2023-02-04 ENCOUNTER — Encounter (HOSPITAL_COMMUNITY): Payer: 59 | Admitting: Physician Assistant

## 2023-02-04 ENCOUNTER — Encounter (HOSPITAL_COMMUNITY): Payer: Self-pay

## 2023-02-05 ENCOUNTER — Other Ambulatory Visit (HOSPITAL_COMMUNITY): Payer: Self-pay | Admitting: Internal Medicine

## 2023-02-05 ENCOUNTER — Other Ambulatory Visit (HOSPITAL_COMMUNITY): Payer: Self-pay | Admitting: Adult Health

## 2023-02-05 ENCOUNTER — Ambulatory Visit (INDEPENDENT_AMBULATORY_CARE_PROVIDER_SITE_OTHER): Payer: 59 | Admitting: Family

## 2023-02-05 ENCOUNTER — Telehealth: Payer: Self-pay | Admitting: Orthopedic Surgery

## 2023-02-05 DIAGNOSIS — Z89411 Acquired absence of right great toe: Secondary | ICD-10-CM | POA: Diagnosis not present

## 2023-02-05 DIAGNOSIS — I5022 Chronic systolic (congestive) heart failure: Secondary | ICD-10-CM

## 2023-02-05 DIAGNOSIS — S98111A Complete traumatic amputation of right great toe, initial encounter: Secondary | ICD-10-CM

## 2023-02-05 DIAGNOSIS — L03115 Cellulitis of right lower limb: Secondary | ICD-10-CM

## 2023-02-05 DIAGNOSIS — L97511 Non-pressure chronic ulcer of other part of right foot limited to breakdown of skin: Secondary | ICD-10-CM

## 2023-02-05 NOTE — Telephone Encounter (Signed)
[employer] forms received. To [employer]. 

## 2023-02-08 ENCOUNTER — Telehealth: Payer: Self-pay | Admitting: Orthopedic Surgery

## 2023-02-08 ENCOUNTER — Other Ambulatory Visit: Payer: Self-pay

## 2023-02-08 ENCOUNTER — Other Ambulatory Visit (HOSPITAL_COMMUNITY): Payer: Self-pay

## 2023-02-08 NOTE — Telephone Encounter (Signed)
Pt paind $25 cash and signed medical release form for FMLA. Paid 02/08/23

## 2023-02-09 ENCOUNTER — Other Ambulatory Visit (HOSPITAL_COMMUNITY): Payer: Self-pay

## 2023-02-09 ENCOUNTER — Telehealth: Payer: Self-pay | Admitting: Family

## 2023-02-09 ENCOUNTER — Encounter: Payer: Self-pay | Admitting: Family

## 2023-02-09 NOTE — Telephone Encounter (Signed)
Pt had gotten oxycodone IR 5mg  #30 at d/c from hospital on 01/29/23, he is requesting a refill for this

## 2023-02-09 NOTE — Progress Notes (Signed)
Office Visit Note   Patient: Randy [name]           Date of Birth: 1971/01/16           MRN: [id number] Visit Date: 02/05/2023              Requested by: No referring provider defined for this encounter. PCP: Pcp, No  Chief Complaint  Patient presents with   Right Foot - Follow-up    09/17/2021 right foot debridement       HPI: The patient is a 52 year old gentleman who is seen in hospitalization follow-up. He was hospitalized on September 26 for cellulitis and sepsis related to his right lower extremity.  An MRI was obtained of the foot and ankle he had significant swelling and blistering to the lower leg.  It was felt that this developed due to a chronic ulcer to the distal tip of his second toe  He was transitioned to cefadroxil and doxycycline  Assessment & Plan: Visit Diagnoses: No diagnosis found.  Plan: The patient was placed in a 3 layer compression wrap recommended elevation.  Discussed return precautions.  The patient will follow-up with Dr. Lajoyce Corners in 1 week concerned for potential osteomyelitis in the second toe second ray of the right foot.  Follow-Up Instructions: No follow-ups on file.   Ortho Exam  Patient is alert, oriented, no adenopathy, well-dressed, normal affect, normal respiratory effort. On examination of the right lower extremity he has resolving cellulitis with erythema.  He has large ulcerations present.  There are blisters medially some ischemic injury.  There is a large island of eschar to the posterior calf.  On examination of the foot he has dry Wagner grade 1 ulcer to the Tip of the second toe from clawing of the toe.  This is dry is 5 mm in diameter without depth or drainage today  Imaging: No results found.     Labs: Lab Results  Component Value Date   HGBA1C 5.8 (A) 12/22/2022   HGBA1C 6.4 (A) 08/12/2022   HGBA1C 5.9 (A) 05/06/2022   ESRSEDRATE 30 (H) 01/22/2023   ESRSEDRATE 3 08/07/2020   CRP 26.4 (H) 01/22/2023   REPTSTATUS  01/26/2023 FINAL 01/21/2023   CULT  01/21/2023    NO GROWTH 5 DAYS Performed at [facility], [address], [place], [place] [address]      Lab Results  Component Value Date   ALBUMIN 2.3 (L) 01/27/2023   ALBUMIN 2.4 (L) 01/26/2023   ALBUMIN 2.3 (L) 01/25/2023    Lab Results  Component Value Date   MG 2.1 01/22/2023   MG 1.9 12/24/2022   MG 2.2 09/17/2021   No results found for: "VD25OH"  No results found for: "PREALBUMIN"    Latest Ref Rng & Units 01/28/2023    5:27 AM 01/27/2023    4:34 AM 01/26/2023    4:58 AM  CBC EXTENDED  WBC 4.0 - 10.5 K/uL 19.7  23.4  22.9   RBC 4.22 - 5.81 MIL/uL 4.47  4.70  4.79   Hemoglobin 13.0 - 17.0 g/dL 62.1  30.8  65.7   HCT 39.0 - 52.0 % 34.6  37.3  37.0   Platelets 150 - 400 K/uL 367  328  292      There is no height or weight on file to calculate BMI.  Orders:  No orders of the defined types were placed in this encounter.  No orders of the defined types were placed in this encounter.  Procedures: No procedures performed  Clinical Data: No additional findings.  ROS:  All other systems negative, except as noted in the HPI. Review of Systems  Objective: Vital Signs: There were no vitals taken for this visit.  Specialty Comments:  No specialty comments available.  PMFS History: Patient Active Problem List   Diagnosis Date Noted   Typical atrial flutter (HCC) 01/26/2023   Sepsis (HCC) 01/21/2023   Persistent atrial fibrillation (HCC) 11/23/2022   Hypercoagulable state due to persistent atrial fibrillation (HCC) 05/21/2022   Osteomyelitis of great toe of right foot (HCC)    Cutaneous abscess of right foot    Cellulitis of right lower extremity 09/15/2021   Leg pain, medial, right 09/14/2021   Permanent atrial fibrillation (HCC) 01/27/2021   SIRS (systemic inflammatory response syndrome) (HCC) 01/27/2021   HTN (hypertension) 01/27/2021   Hypokalemia 01/27/2021   Acute systolic CHF (congestive heart  failure) (HCC) 01/27/2021   New onset of congestive heart failure (HCC) 01/26/2021   Acute CVA (cerebrovascular accident) (HCC) 08/07/2020   Acute upper respiratory infection 11/27/2014   Numbness of finger 09/19/2011   Hilar adenopathy    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HCC) 09/05/2010   Type 2 diabetes mellitus with complication, without long-term current use of insulin (HCC) 04/23/2010   Renal insufficiency 03/21/2008   VISUAL ACUITY, DECREASED, LEFT EYE 03/19/2008   BELL'S PALSY, LEFT 01/30/2008   Morbid obesity (HCC) 01/20/2008   Malignant hypertension with heart failure and stage 3 chronic kidney disease (HCC) 01/20/2008   Past Medical History:  Diagnosis Date   CKD (chronic kidney disease) stage 3, GFR 30-59 ml/min (HCC)    baseline Cr 1.5-1.7   Decreased visual acuity    Left eye, resolved - hypertensive retinopathy   Diabetes mellitus 2012   Type II   Hilar adenopathy    on CT scan 02/2010, on rpt scan stable/improved.   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   Systolic murmur    Vitamin D deficiency     Family History  Problem Relation Age of Onset   Hypertension Mother    Hypertension Father    Diabetes Father    Diabetes Sister    Hypertension Brother    Kidney disease Paternal Grandmother        ESRD   Coronary artery disease Neg Hx    Stroke Neg Hx    Cancer Neg Hx     Past Surgical History:  Procedure Laterality Date   AMPUTATION Right 09/17/2021   Procedure: First ray amputation;  Surgeon: Nadara Mustard, MD;  Location: [facility] OR;  Service: Orthopedics;  Laterality: Right;   APPLICATION OF WOUND VAC Right 10/10/2021   Procedure: APPLICATION OF WOUND VAC;  Surgeon: Nadara Mustard,  MD;  Location: [facility] OR;  Service: Orthopedics;  Laterality: Right;   ATRIAL FIBRILLATION ABLATION N/A 10/02/2022   Procedure: ATRIAL FIBRILLATION ABLATION;  Surgeon: Regan Lemming, MD;  Location: [facility] INVASIVE CV LAB;  Service: Cardiovascular;  Laterality: N/A;   CARDIOVERSION N/A 01/31/2021   Procedure: CARDIOVERSION;  Surgeon: Dolores Patty, MD;  Location: [facility] ENDOSCOPY;  Service: Cardiovascular;  Laterality: N/A;  CARDIOVERSION N/A 07/04/2021   Procedure: CARDIOVERSION;  Surgeon: Dolores Patty, MD;  Location: [facility] ENDOSCOPY;  Service: Cardiovascular;  Laterality: N/A;   hospitalization  11/2008   malignant HTN, nl SPEP/UPEP, neg ANCA panel, nl C3/4, neg anti GBM Ab, neg Hep A/B/C, nl renal US, nl PTH, neg HIV   I & D EXTREMITY Right 10/10/2021   Procedure: RIGHT FOOT DEBRIDEMENT;  Surgeon: Nadara Mustard, MD;  Location: [facility] OR;  Service: Orthopedics;  Laterality: Right;   RIGHT/LEFT HEART CATH AND CORONARY ANGIOGRAPHY N/A 01/29/2021   Procedure: RIGHT/LEFT HEART CATH AND CORONARY ANGIOGRAPHY;  Surgeon: Dolores Patty, MD;  Location: [facility] INVASIVE CV LAB;  Service: Cardiovascular;  Laterality: N/A;   TEE WITHOUT CARDIOVERSION N/A 01/31/2021   Procedure: TRANSESOPHAGEAL ECHOCARDIOGRAM (TEE);  Surgeon: Dolores Patty, MD;  Location: [name] ENDOSCOPY;  Service: Cardiovascular;  Laterality: N/A;   TEE WITHOUT CARDIOVERSION N/A 10/02/2022   Procedure: TRANSESOPHAGEAL ECHOCARDIOGRAM;  Surgeon: Regan Lemming, MD;  Location: [facility] INVASIVE CV LAB;  Service: Cardiovascular;  Laterality: N/A;   US ECHOCARDIOGRAPHY  11/2008   LVsys fxn EF 50%, mild MR, normal LV size, neg ANA, neg cryoglobulins   US ECHOCARDIOGRAPHY  2011   severe LVH, EF 40%, LA mildly dilated, PA pressure moderately increased   Social History   Occupational History   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    Comment: [place]  Tobacco Use   Smoking status: Former    Types: Cigars   Smokeless tobacco: Never   Tobacco  comments:    Former smoker 01/01/23  Vaping Use   Vaping status: Never Used  Substance and Sexual Activity   Alcohol use: Not Currently    Comment: stop drinking 01/01/23   Drug use: No   Sexual activity: Not on file

## 2023-02-09 NOTE — Telephone Encounter (Signed)
Patient called needing Rx refilled Oxycodone. The number to contact patient is [phone]

## 2023-02-10 ENCOUNTER — Telehealth: Payer: Self-pay | Admitting: Family

## 2023-02-10 ENCOUNTER — Other Ambulatory Visit: Payer: Self-pay

## 2023-02-10 ENCOUNTER — Other Ambulatory Visit (HOSPITAL_COMMUNITY): Payer: Self-pay

## 2023-02-10 MED ORDER — OXYCODONE HCL 5 MG PO CAPS
5.0000 mg | ORAL_CAPSULE | Freq: Four times a day (QID) | ORAL | 0 refills | Status: DC | PRN
Start: 2023-02-10 — End: 2023-02-17

## 2023-02-10 NOTE — Telephone Encounter (Signed)
1 months

## 2023-02-10 NOTE — Telephone Encounter (Signed)
[employer] is completing forms. Please advise estimated duration of how long he will be out of work. Thank you!

## 2023-02-11 ENCOUNTER — Inpatient Hospital Stay (HOSPITAL_COMMUNITY)
Admission: EM | Admit: 2023-02-11 | Discharge: 2023-02-17 | DRG: 603 | Disposition: A | Discharge status: Home / Self Care | Payer: 59 | Attending: Internal Medicine | Admitting: Internal Medicine

## 2023-02-11 ENCOUNTER — Encounter: Payer: Self-pay | Admitting: Orthopedic Surgery

## 2023-02-11 ENCOUNTER — Other Ambulatory Visit: Payer: Self-pay

## 2023-02-11 ENCOUNTER — Inpatient Hospital Stay (HOSPITAL_COMMUNITY): Payer: 59

## 2023-02-11 ENCOUNTER — Emergency Department (HOSPITAL_COMMUNITY): Payer: 59

## 2023-02-11 ENCOUNTER — Encounter (HOSPITAL_COMMUNITY): Payer: Self-pay | Admitting: Family Medicine

## 2023-02-11 ENCOUNTER — Ambulatory Visit: Payer: 59 | Admitting: Orthopedic Surgery

## 2023-02-11 DIAGNOSIS — Z6841 Body Mass Index (BMI) 40.0 and over, adult: Secondary | ICD-10-CM | POA: Diagnosis not present

## 2023-02-11 DIAGNOSIS — E785 Hyperlipidemia, unspecified: Secondary | ICD-10-CM | POA: Diagnosis present

## 2023-02-11 DIAGNOSIS — L97511 Non-pressure chronic ulcer of other part of right foot limited to breakdown of skin: Secondary | ICD-10-CM | POA: Diagnosis not present

## 2023-02-11 DIAGNOSIS — I4819 Other persistent atrial fibrillation: Secondary | ICD-10-CM | POA: Diagnosis present

## 2023-02-11 DIAGNOSIS — E11628 Type 2 diabetes mellitus with other skin complications: Principal | ICD-10-CM | POA: Diagnosis present

## 2023-02-11 DIAGNOSIS — I130 Hypertensive heart and chronic kidney disease with heart failure and stage 1 through stage 4 chronic kidney disease, or unspecified chronic kidney disease: Secondary | ICD-10-CM | POA: Diagnosis present

## 2023-02-11 DIAGNOSIS — I5022 Chronic systolic (congestive) heart failure: Secondary | ICD-10-CM | POA: Diagnosis present

## 2023-02-11 DIAGNOSIS — Z7901 Long term (current) use of anticoagulants: Secondary | ICD-10-CM | POA: Diagnosis not present

## 2023-02-11 DIAGNOSIS — E1122 Type 2 diabetes mellitus with diabetic chronic kidney disease: Secondary | ICD-10-CM | POA: Diagnosis present

## 2023-02-11 DIAGNOSIS — Z79899 Other long term (current) drug therapy: Secondary | ICD-10-CM

## 2023-02-11 DIAGNOSIS — E118 Type 2 diabetes mellitus with unspecified complications: Secondary | ICD-10-CM | POA: Diagnosis not present

## 2023-02-11 DIAGNOSIS — N179 Acute kidney failure, unspecified: Secondary | ICD-10-CM | POA: Diagnosis present

## 2023-02-11 DIAGNOSIS — Z8249 Family history of ischemic heart disease and other diseases of the circulatory system: Secondary | ICD-10-CM

## 2023-02-11 DIAGNOSIS — Z8673 Personal history of transient ischemic attack (TIA), and cerebral infarction without residual deficits: Secondary | ICD-10-CM | POA: Diagnosis not present

## 2023-02-11 DIAGNOSIS — Z89411 Acquired absence of right great toe: Secondary | ICD-10-CM | POA: Diagnosis not present

## 2023-02-11 DIAGNOSIS — Z7984 Long term (current) use of oral hypoglycemic drugs: Secondary | ICD-10-CM | POA: Diagnosis not present

## 2023-02-11 DIAGNOSIS — Z833 Family history of diabetes mellitus: Secondary | ICD-10-CM

## 2023-02-11 DIAGNOSIS — D6869 Other thrombophilia: Secondary | ICD-10-CM | POA: Diagnosis present

## 2023-02-11 DIAGNOSIS — E1165 Type 2 diabetes mellitus with hyperglycemia: Secondary | ICD-10-CM | POA: Diagnosis present

## 2023-02-11 DIAGNOSIS — Z91013 Allergy to seafood: Secondary | ICD-10-CM

## 2023-02-11 DIAGNOSIS — I4821 Permanent atrial fibrillation: Secondary | ICD-10-CM | POA: Diagnosis present

## 2023-02-11 DIAGNOSIS — Z7985 Long-term (current) use of injectable non-insulin antidiabetic drugs: Secondary | ICD-10-CM

## 2023-02-11 DIAGNOSIS — I10 Essential (primary) hypertension: Secondary | ICD-10-CM | POA: Diagnosis present

## 2023-02-11 DIAGNOSIS — L03115 Cellulitis of right lower limb: Secondary | ICD-10-CM | POA: Diagnosis not present

## 2023-02-11 DIAGNOSIS — N1832 Chronic kidney disease, stage 3b: Secondary | ICD-10-CM | POA: Diagnosis present

## 2023-02-11 DIAGNOSIS — Z87891 Personal history of nicotine dependence: Secondary | ICD-10-CM | POA: Diagnosis not present

## 2023-02-11 DIAGNOSIS — E559 Vitamin D deficiency, unspecified: Secondary | ICD-10-CM | POA: Diagnosis present

## 2023-02-11 DIAGNOSIS — L02415 Cutaneous abscess of right lower limb: Principal | ICD-10-CM | POA: Diagnosis present

## 2023-02-11 DIAGNOSIS — I158 Other secondary hypertension: Secondary | ICD-10-CM | POA: Diagnosis not present

## 2023-02-11 DIAGNOSIS — Z713 Dietary counseling and surveillance: Secondary | ICD-10-CM

## 2023-02-11 LAB — GLUCOSE, CAPILLARY
Glucose-Capillary: 137 mg/dL — ABNORMAL HIGH (ref 70–99)
Glucose-Capillary: 115 mg/dL — ABNORMAL HIGH (ref 70–99)

## 2023-02-11 LAB — CBC WITH DIFFERENTIAL/PLATELET
Abs Immature Granulocytes: 0.04 10*3/uL (ref 0.00–0.07)
Basophils Absolute: 0.1 10*3/uL (ref 0.0–0.1)
Basophils Relative: 1 %
Eosinophils Absolute: 0.1 10*3/uL (ref 0.0–0.5)
Eosinophils Relative: 1 %
HCT: 42.8 % (ref 39.0–52.0)
Hemoglobin: 13.7 g/dL (ref 13.0–17.0)
Immature Granulocytes: 0 %
Lymphocytes Relative: 25 %
Lymphs Abs: 2.7 10*3/uL (ref 0.7–4.0)
MCH: 25.9 pg — ABNORMAL LOW (ref 26.0–34.0)
MCHC: 32 g/dL (ref 30.0–36.0)
MCV: 80.9 fL (ref 80.0–100.0)
Monocytes Absolute: 1 10*3/uL (ref 0.1–1.0)
Monocytes Relative: 9 %
Neutro Abs: 6.9 10*3/uL (ref 1.7–7.7)
Neutrophils Relative %: 64 %
Platelets: 424 10*3/uL — ABNORMAL HIGH (ref 150–400)
RBC: 5.29 MIL/uL (ref 4.22–5.81)
RDW: 15.9 % — ABNORMAL HIGH (ref 11.5–15.5)
WBC: 10.8 10*3/uL — ABNORMAL HIGH (ref 4.0–10.5)
nRBC: 0 % (ref 0.0–0.2)

## 2023-02-11 LAB — BASIC METABOLIC PANEL
Anion gap: 6 (ref 5–15)
BUN: 29 mg/dL — ABNORMAL HIGH (ref 6–20)
CO2: 26 mmol/L (ref 22–32)
Calcium: 9 mg/dL (ref 8.9–10.3)
Chloride: 105 mmol/L (ref 98–111)
Creatinine, Ser: 2.19 mg/dL — ABNORMAL HIGH (ref 0.61–1.24)
GFR, Estimated: 36 mL/min — ABNORMAL LOW (ref >60)
Glucose, Bld: 155 mg/dL — ABNORMAL HIGH (ref 70–99)
Potassium: 4.2 mmol/L (ref 3.5–5.1)
Sodium: 137 mmol/L (ref 135–145)

## 2023-02-11 LAB — C-REACTIVE PROTEIN: CRP: 7.4 mg/dL — ABNORMAL HIGH (ref <1.0)

## 2023-02-11 LAB — HEMOGLOBIN A1C
Hgb A1c MFr Bld: 6.5 % — ABNORMAL HIGH (ref 4.8–5.6)
Mean Plasma Glucose: 139.85 mg/dL

## 2023-02-11 LAB — SEDIMENTATION RATE: Sed Rate: 54 mm/h — ABNORMAL HIGH (ref 0–16)

## 2023-02-11 MED ORDER — GADOBUTROL 1 MMOL/ML IV SOLN
10.0000 mL | Freq: Once | INTRAVENOUS | Status: AC | PRN
  Administered 2023-02-11: 10 mL via INTRAVENOUS

## 2023-02-11 MED ORDER — ISOSORB DINITRATE-HYDRALAZINE 20-37.5 MG PO TABS
2.0000 | ORAL_TABLET | Freq: Three times a day (TID) | ORAL | Status: DC
  Administered 2023-02-11 – 2023-02-17 (×18): 2 via ORAL
  Filled 2023-02-11 (×18): qty 2

## 2023-02-11 MED ORDER — FUROSEMIDE 20 MG PO TABS
40.0000 mg | ORAL_TABLET | Freq: Every day | ORAL | Status: DC
  Administered 2023-02-11 – 2023-02-17 (×7): 40 mg via ORAL
  Filled 2023-02-11 (×7): qty 2

## 2023-02-11 MED ORDER — INSULIN ASPART 100 UNIT/ML IJ SOLN: 0.0000 [IU] | Freq: Every day | INTRAMUSCULAR | Status: DC

## 2023-02-11 MED ORDER — INSULIN ASPART 100 UNIT/ML IJ SOLN
0.0000 [IU] | Freq: Three times a day (TID) | INTRAMUSCULAR | Status: DC
  Administered 2023-02-11: 2 [IU] via SUBCUTANEOUS
  Administered 2023-02-15: 3 [IU] via SUBCUTANEOUS

## 2023-02-11 MED ORDER — ONDANSETRON HCL 4 MG PO TABS: 4.0000 mg | ORAL_TABLET | Freq: Four times a day (QID) | ORAL | Status: DC | PRN

## 2023-02-11 MED ORDER — OXYCODONE HCL 5 MG PO TABS
5.0000 mg | ORAL_TABLET | Freq: Once | ORAL | Status: AC
  Administered 2023-02-11: 5 mg via ORAL

## 2023-02-11 MED ORDER — VANCOMYCIN HCL 2000 MG/400ML IV SOLN
2000.0000 mg | INTRAVENOUS | Status: DC
  Administered 2023-02-11: 2000 mg via INTRAVENOUS
  Filled 2023-02-11: qty 400

## 2023-02-11 MED ORDER — ACETAMINOPHEN 500 MG PO TABS
1000.0000 mg | ORAL_TABLET | Freq: Once | ORAL | Status: AC
  Administered 2023-02-11: 1000 mg via ORAL
  Filled 2023-02-11: qty 2

## 2023-02-11 MED ORDER — ACETAMINOPHEN 650 MG RE SUPP: 650.0000 mg | Freq: Four times a day (QID) | RECTAL | Status: DC | PRN

## 2023-02-11 MED ORDER — ATORVASTATIN CALCIUM 10 MG PO TABS
20.0000 mg | ORAL_TABLET | Freq: Every day | ORAL | Status: DC
  Administered 2023-02-11 – 2023-02-16 (×6): 20 mg via ORAL
  Filled 2023-02-11 (×6): qty 2

## 2023-02-11 MED ORDER — OXYCODONE HCL 5 MG PO TABS
5.0000 mg | ORAL_TABLET | Freq: Four times a day (QID) | ORAL | Status: DC | PRN
  Administered 2023-02-11 – 2023-02-12 (×2): 5 mg via ORAL
  Filled 2023-02-11 (×3): qty 1

## 2023-02-11 MED ORDER — GADOBUTROL 1 MMOL/ML IV SOLN: 10.0000 mL | Freq: Once | INTRAVENOUS | Status: DC | PRN

## 2023-02-11 MED ORDER — SODIUM CHLORIDE 0.9% FLUSH
3.0000 mL | Freq: Two times a day (BID) | INTRAVENOUS | Status: DC
  Administered 2023-02-11 – 2023-02-17 (×9): 3 mL via INTRAVENOUS

## 2023-02-11 MED ORDER — VANCOMYCIN HCL 2000 MG/400ML IV SOLN
2000.0000 mg | Freq: Once | INTRAVENOUS | Status: DC
  Filled 2023-02-11: qty 400

## 2023-02-11 MED ORDER — DILTIAZEM HCL ER COATED BEADS 180 MG PO CP24
360.0000 mg | ORAL_CAPSULE | Freq: Every day | ORAL | Status: DC
  Administered 2023-02-11 – 2023-02-17 (×7): 360 mg via ORAL
  Filled 2023-02-11 (×7): qty 2

## 2023-02-11 MED ORDER — SODIUM CHLORIDE 0.9 % IV SOLN
2.0000 g | Freq: Three times a day (TID) | INTRAVENOUS | Status: DC
  Administered 2023-02-11: 2 g via INTRAVENOUS
  Filled 2023-02-11: qty 12.5

## 2023-02-11 MED ORDER — ACETAMINOPHEN 325 MG PO TABS
650.0000 mg | ORAL_TABLET | Freq: Four times a day (QID) | ORAL | Status: DC | PRN
  Administered 2023-02-11 – 2023-02-13 (×3): 650 mg via ORAL
  Filled 2023-02-11 (×3): qty 2

## 2023-02-11 MED ORDER — ONDANSETRON HCL 4 MG/2ML IJ SOLN
4.0000 mg | Freq: Four times a day (QID) | INTRAMUSCULAR | Status: DC | PRN
  Administered 2023-02-13: 4 mg via INTRAVENOUS
  Filled 2023-02-11: qty 2

## 2023-02-11 MED ORDER — APIXABAN 5 MG PO TABS
5.0000 mg | ORAL_TABLET | Freq: Two times a day (BID) | ORAL | Status: DC
  Administered 2023-02-11 – 2023-02-17 (×12): 5 mg via ORAL
  Filled 2023-02-11 (×12): qty 1

## 2023-02-11 MED ORDER — METOPROLOL SUCCINATE ER 25 MG PO TB24
100.0000 mg | ORAL_TABLET | Freq: Every day | ORAL | Status: DC
  Administered 2023-02-11 – 2023-02-16 (×6): 100 mg via ORAL
  Filled 2023-02-11 (×6): qty 4

## 2023-02-11 NOTE — ED Triage Notes (Signed)
Pt reports he was sent from podiatry for worsening right lower leg cellulitis. Pt denies fevers at home.

## 2023-02-11 NOTE — Progress Notes (Signed)
Office Visit Note   Patient: Randy [name]           Date of Birth: 16-Jul-1970           MRN: [id number] Visit Date: 02/11/2023              Requested by: No referring provider defined for this encounter. PCP: Pcp, No  Chief Complaint  Patient presents with   Right Leg - Follow-up      HPI: Patient is a 52 year old gentleman who was recently hospitalized for cellulitis of the right calf.  This resolved with IV antibiotics and doxycycline.  Patient states he has developed new pain ulceration and drainage from the lateral aspect of the right foot and ankle.  Assessment & Plan: Visit Diagnoses:  1. Right foot ulcer, limited to breakdown of skin (HCC)   2. Cellulitis of right lower extremity     Plan: Recommended patient go to the emergency room at [facility] to be admitted for IV antibiotics to treat the new area of cellulitis and ulceration as well as an MRI scan of the right ankle to rule out deep abscess or osteomyelitis.  Follow-Up Instructions: Return in about 4 weeks (around 03/11/2023).   Ortho Exam  Patient is alert, oriented, no adenopathy, well-dressed, normal affect, normal respiratory effort. Examination of the blistering ulcers medial and lateral calf are healing nicely.  There is dry skin that is removed and healthy tissue beneath this.  Patient has developed a new purulent drainage ulcer over the lateral aspect of the right ankle which extends from the foot over the lateral malleolus.  There is purulent drainage cellulitis blistering ulcers the wound measures 15 x 20 cm.  Patient does have a palpable dorsalis pedis pulse.  Patient also has a ulcer over the tip of the right second toe.  After informed consent a 10 blade knife was used to debride the skin and soft tissue back to healthy viable granulation tissue no evidence of a deep ulcer no evidence of this exposed bone.  The ulcer is 1 cm in diameter after debridement with healthy granulation tissue.  Imaging: No results  found. No images are attached to the encounter.  Labs: Lab Results  Component Value Date   HGBA1C 5.8 (A) 12/22/2022   HGBA1C 6.4 (A) 08/12/2022   HGBA1C 5.9 (A) 05/06/2022   ESRSEDRATE 30 (H) 01/22/2023   ESRSEDRATE 3 08/07/2020   CRP 26.4 (H) 01/22/2023   REPTSTATUS 01/26/2023 FINAL 01/21/2023   CULT  01/21/2023    NO GROWTH 5 DAYS Performed at [facility], [address], [place], [place] [address]      Lab Results  Component Value Date   ALBUMIN 2.3 (L) 01/27/2023   ALBUMIN 2.4 (L) 01/26/2023   ALBUMIN 2.3 (L) 01/25/2023    Lab Results  Component Value Date   MG 2.1 01/22/2023   MG 1.9 12/24/2022   MG 2.2 09/17/2021   No results found for: "VD25OH"  No results found for: "PREALBUMIN"    Latest Ref Rng & Units 01/28/2023    5:27 AM 01/27/2023    4:34 AM 01/26/2023    4:58 AM  CBC EXTENDED  WBC 4.0 - 10.5 K/uL 19.7  23.4  22.9   RBC 4.22 - 5.81 MIL/uL 4.47  4.70  4.79   Hemoglobin 13.0 - 17.0 g/dL 19.1  47.8  29.5   HCT 39.0 - 52.0 % 34.6  37.3  37.0   Platelets 150 - 400 K/uL 367  328  292      There is no height or weight on file to calculate BMI.  Orders:  No orders of the defined types were placed in this encounter.  No orders of the defined types were placed in this encounter.    Procedures: No procedures performed  Clinical Data: No additional findings.  ROS:  All other systems negative, except as noted in the HPI. Review of Systems  Objective: Vital Signs: There were no vitals taken for this visit.  Specialty Comments:  No specialty comments available.  PMFS History: Patient Active Problem List   Diagnosis Date Noted   Typical atrial flutter (HCC) 01/26/2023   Sepsis (HCC) 01/21/2023   Persistent atrial fibrillation (HCC) 11/23/2022   Hypercoagulable state due to persistent atrial fibrillation (HCC) 05/21/2022   Osteomyelitis of great toe of right foot (HCC)    Cutaneous abscess of right foot    Cellulitis of right lower  extremity 09/15/2021   Leg pain, medial, right 09/14/2021   Permanent atrial fibrillation (HCC) 01/27/2021   SIRS (systemic inflammatory response syndrome) (HCC) 01/27/2021   HTN (hypertension) 01/27/2021   Hypokalemia 01/27/2021   Acute systolic CHF (congestive heart failure) (HCC) 01/27/2021   New onset of congestive heart failure (HCC) 01/26/2021   Acute CVA (cerebrovascular accident) (HCC) 08/07/2020   Acute upper respiratory infection 11/27/2014   Numbness of finger 09/19/2011   Hilar adenopathy    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HCC) 09/05/2010   Type 2 diabetes mellitus with complication, without long-term current use of insulin (HCC) 04/23/2010   Renal insufficiency 03/21/2008   VISUAL ACUITY, DECREASED, LEFT EYE 03/19/2008   BELL'S PALSY, LEFT 01/30/2008   Morbid obesity (HCC) 01/20/2008   Malignant hypertension with heart failure and stage 3 chronic kidney disease (HCC) 01/20/2008   Past Medical History:  Diagnosis Date   CKD (chronic kidney disease) stage 3, GFR 30-59 ml/min (HCC)    baseline Cr 1.5-1.7   Decreased visual acuity    Left eye, resolved - hypertensive retinopathy   Diabetes mellitus 2012   Type II   Hilar adenopathy    on CT scan 02/2010, on rpt scan stable/improved.   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   Systolic murmur    Vitamin D deficiency     Family History  Problem Relation Age of Onset   Hypertension Mother    Hypertension Father    Diabetes Father    Diabetes Sister    Hypertension Brother    Kidney disease Paternal Grandmother        ESRD   Coronary artery disease Neg Hx    Stroke Neg Hx    Cancer Neg Hx     Past Surgical History:  Procedure  Laterality Date   AMPUTATION Right 09/17/2021   Procedure: First ray amputation;  Surgeon: Nadara Mustard, MD;  Location: [facility] OR;  Service: Orthopedics;  Laterality: Right;   APPLICATION OF WOUND VAC Right 10/10/2021   Procedure: APPLICATION OF WOUND VAC;  Surgeon: Nadara Mustard, MD;  Location: [facility] OR;  Service: Orthopedics;  Laterality: Right;   ATRIAL FIBRILLATION ABLATION N/A 10/02/2022   Procedure:  ATRIAL FIBRILLATION ABLATION;  Surgeon: Regan Lemming, MD;  Location: [facility] INVASIVE CV LAB;  Service: Cardiovascular;  Laterality: N/A;   CARDIOVERSION N/A 01/31/2021   Procedure: CARDIOVERSION;  Surgeon: Dolores Patty, MD;  Location: [facility] ENDOSCOPY;  Service: Cardiovascular;  Laterality: N/A;   CARDIOVERSION N/A 07/04/2021   Procedure: CARDIOVERSION;  Surgeon: Dolores Patty, MD;  Location: [facility] ENDOSCOPY;  Service: Cardiovascular;  Laterality: N/A;   hospitalization  11/2008   malignant HTN, nl SPEP/UPEP, neg ANCA panel, nl C3/4, neg anti GBM Ab, neg Hep A/B/C, nl renal US, nl PTH, neg HIV   I & D EXTREMITY Right 10/10/2021   Procedure: RIGHT FOOT DEBRIDEMENT;  Surgeon: Nadara Mustard, MD;  Location: [facility] OR;  Service: Orthopedics;  Laterality: Right;   RIGHT/LEFT HEART CATH AND CORONARY ANGIOGRAPHY N/A 01/29/2021   Procedure: RIGHT/LEFT HEART CATH AND CORONARY ANGIOGRAPHY;  Surgeon: Dolores Patty, MD;  Location: [facility] INVASIVE CV LAB;  Service: Cardiovascular;  Laterality: N/A;   TEE WITHOUT CARDIOVERSION N/A 01/31/2021   Procedure: TRANSESOPHAGEAL ECHOCARDIOGRAM (TEE);  Surgeon: Dolores Patty, MD;  Location: [facility] ENDOSCOPY;  Service: Cardiovascular;  Laterality: N/A;   TEE WITHOUT CARDIOVERSION N/A 10/02/2022   Procedure: TRANSESOPHAGEAL ECHOCARDIOGRAM;  Surgeon: Regan Lemming, MD;  Location: [facility] INVASIVE CV LAB;  Service: Cardiovascular;  Laterality: N/A;   US ECHOCARDIOGRAPHY  11/2008   LVsys fxn EF 50%, mild MR, normal LV size, neg ANA, neg cryoglobulins   US ECHOCARDIOGRAPHY   2011   severe LVH, EF 40%, LA mildly dilated, PA pressure moderately increased   Social History   Occupational History   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

## 2023-02-11 NOTE — ED Notes (Signed)
ED TO INPATIENT HANDOFF REPORT  ED Nurse Name and Phone #: [phone]  S Name/Age/Gender Randy [name] 52 y.o. male Room/Bed: 002C/002C  Code Status   Code Status: Full Code  Home/SNF/Other Home Patient oriented to: self, place, time, and situation Is this baseline? Yes   Triage Complete: Triage complete  Chief Complaint Cellulitis of right lower extremity [L03.115]  Triage Note Pt reports he was sent from podiatry for worsening right lower leg cellulitis. Pt denies fevers at home.    Allergies Allergies  Allergen Reactions   Fish-Derived Products Anaphylaxis   Shellfish Allergy Anaphylaxis    Level of Care/Admitting Diagnosis ED Disposition     ED Disposition  Admit   Condition  --   Comment  Hospital Area: [facility] [[id number]]  Level of Care: Med-Surg [16]  May admit patient to [facility] or [facility] if equivalent level of care is available:: No  Covid Evaluation: Asymptomatic - no recent exposure (last 10 days) testing not required  Diagnosis: Cellulitis of right lower extremity [098119]  Admitting Physician: Hughie Closs [[id number]]  Attending Physician: Hughie Closs [[id number]]  Certification:: I certify this patient will need inpatient services for at least 2 midnights  Expected Medical Readiness: 02/13/2023          B Medical/Surgery History Past Medical History:  Diagnosis Date   CKD (chronic kidney disease) stage 3, GFR 30-59 ml/min (HCC)    baseline Cr 1.5-1.7   Decreased visual acuity    Left eye, resolved - hypertensive retinopathy   Diabetes mellitus 2012   Type II   Hilar adenopathy    on CT scan 02/2010, on rpt scan stable/improved.   History of Bell's palsy 12/2007   history, Left   History of headache    HTN (hypertension), malignant    previously on BC and goody powders for HA   Internal derangement of knee 12/2007   Left   Microalbuminuria    Morbid obesity (HCC)    Stroke (HCC)    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HCC)     echo 2011 with nonischemic hypertensive cardiomyopathy   Systolic murmur    Vitamin D deficiency    Past Surgical History:  Procedure Laterality Date   AMPUTATION Right 09/17/2021   Procedure: First ray amputation;  Surgeon: Nadara Mustard, MD;  Location: [facility] OR;  Service: Orthopedics;  Laterality: Right;   APPLICATION OF WOUND VAC Right 10/10/2021   Procedure: APPLICATION OF WOUND VAC;  Surgeon: Nadara Mustard, MD;  Location: [facility] OR;  Service: Orthopedics;  Laterality: Right;   ATRIAL FIBRILLATION ABLATION N/A 10/02/2022   Procedure: ATRIAL FIBRILLATION ABLATION;  Surgeon: Regan Lemming, MD;  Location: [facility] INVASIVE CV LAB;  Service: Cardiovascular;  Laterality: N/A;   CARDIOVERSION N/A 01/31/2021   Procedure: CARDIOVERSION;  Surgeon: Dolores Patty, MD;  Location: [facility] ENDOSCOPY;  Service: Cardiovascular;  Laterality: N/A;   CARDIOVERSION N/A 07/04/2021   Procedure: CARDIOVERSION;  Surgeon: Dolores Patty, MD;  Location: [facility] ENDOSCOPY;  Service: Cardiovascular;  Laterality: N/A;   hospitalization  11/2008   malignant HTN, nl SPEP/UPEP, neg ANCA panel, nl C3/4, neg anti GBM Ab, neg Hep A/B/C, nl renal US, nl PTH, neg HIV   I & D EXTREMITY Right 10/10/2021   Procedure: RIGHT FOOT DEBRIDEMENT;  Surgeon: Nadara Mustard, MD;  Location: [facility] OR;  Service: Orthopedics;  Laterality: Right;   RIGHT/LEFT HEART CATH AND CORONARY ANGIOGRAPHY N/A 01/29/2021   Procedure: RIGHT/LEFT HEART CATH AND CORONARY ANGIOGRAPHY;  Surgeon: Gala Romney,  Bevelyn Buckles, MD;  Location: [facility] INVASIVE CV LAB;  Service: Cardiovascular;  Laterality: N/A;   TEE WITHOUT CARDIOVERSION N/A 01/31/2021   Procedure: TRANSESOPHAGEAL ECHOCARDIOGRAM (TEE);  Surgeon: Dolores Patty, MD;  Location: [facility] ENDOSCOPY;  Service: Cardiovascular;  Laterality: N/A;   TEE WITHOUT CARDIOVERSION N/A 10/02/2022   Procedure: TRANSESOPHAGEAL ECHOCARDIOGRAM;  Surgeon: Regan Lemming, MD;  Location: [facility] INVASIVE CV LAB;  Service: Cardiovascular;   Laterality: N/A;   US ECHOCARDIOGRAPHY  11/2008   LVsys fxn EF 50%, mild MR, normal LV size, neg ANA, neg cryoglobulins   US ECHOCARDIOGRAPHY  2011   severe LVH, EF 40%, LA mildly dilated, PA pressure moderately increased     A IV Location/Drains/Wounds Patient Lines/Drains/Airways Status     Active Line/Drains/Airways     Name Placement date Placement time Site Days   Peripheral IV 02/11/23 20 G Posterior;Right Forearm 02/11/23  1231  Forearm  less than 1   Wound / Incision (Open or Dehisced) 01/22/23 Non-pressure wound Toe (Comment  which one) Right;Posterior;Mid circular area of scab on tip of R 2nd toe 01/22/23  1930  Toe (Comment  which one)  20            Intake/Output Last 24 hours No intake or output data in the 24 hours ending 02/11/23 1536  Labs/Imaging Results for orders placed or performed during the hospital encounter of 02/11/23 (from the past 48 hour(s))  CBC with Differential/Platelet     Status: Abnormal   Collection Time: 02/11/23 12:32 PM  Result Value Ref Range   WBC 10.8 (H) 4.0 - 10.5 K/uL   RBC 5.29 4.22 - 5.81 MIL/uL   Hemoglobin 13.7 13.0 - 17.0 g/dL   HCT 16.1 09.6 - 04.5 %   MCV 80.9 80.0 - 100.0 fL   MCH 25.9 (L) 26.0 - 34.0 pg   MCHC 32.0 30.0 - 36.0 g/dL   RDW 40.9 (H) 81.1 - 91.4 %   Platelets 424 (H) 150 - 400 K/uL   nRBC 0.0 0.0 - 0.2 %   Neutrophils Relative % 64 %   Neutro Abs 6.9 1.7 - 7.7 K/uL   Lymphocytes Relative 25 %   Lymphs Abs 2.7 0.7 - 4.0 K/uL   Monocytes Relative 9 %   Monocytes Absolute 1.0 0.1 - 1.0 K/uL   Eosinophils Relative 1 %   Eosinophils Absolute 0.1 0.0 - 0.5 K/uL   Basophils Relative 1 %   Basophils Absolute 0.1 0.0 - 0.1 K/uL   Immature Granulocytes 0 %   Abs Immature Granulocytes 0.04 0.00 - 0.07 K/uL    Comment: Performed at [facility], [address] [place], [place] [address]  Basic metabolic panel     Status: Abnormal   Collection Time: 02/11/23 12:32 PM  Result Value Ref Range   Sodium  137 135 - 145 mmol/L   Potassium 4.2 3.5 - 5.1 mmol/L   Chloride 105 98 - 111 mmol/L   CO2 26 22 - 32 mmol/L   Glucose, Bld 155 (H) 70 - 99 mg/dL    Comment: Glucose reference range applies only to samples taken after fasting for at least 8 hours.   BUN 29 (H) 6 - 20 mg/dL   Creatinine, Ser 6.21 (H) 0.61 - 1.24 mg/dL   Calcium 9.0 8.9 - 30.8 mg/dL   GFR, Estimated 36 (L) >60 mL/min    Comment: (NOTE) Calculated using the CKD-EPI Creatinine Equation (2021)    Anion gap 6 5 - 15  Comment: Performed at [facility], [address] [place], [place] [address]  Sedimentation rate     Status: Abnormal   Collection Time: 02/11/23 12:32 PM  Result Value Ref Range   Sed Rate 54 (H) 0 - 16 mm/hr    Comment: Performed at [facility], [address] [place], [place] [address]  C-reactive protein     Status: Abnormal   Collection Time: 02/11/23 12:32 PM  Result Value Ref Range   CRP 7.4 (H) <1.0 mg/dL    Comment: Performed at [facility], [address], [place], [place] [address]   DG Foot Complete Right  Result Date: 02/11/2023 CLINICAL DATA:  Cellulitis. Cast on for 4 weeks. When catheter is removed, right ankle and foot skin breakdown and swelling. Evaluate for osteomyelitis. EXAM: RIGHT FOOT COMPLETE - 3+ VIEW; RIGHT ANKLE - COMPLETE 3+ VIEW COMPARISON:  Right foot radiographs 09/14/2021, MRI right forefoot 09/15/2021, right foot radiographs 01/21/2023, MRI right forefoot 01/21/2023, CT right tibia and fibula 01/25/2023 FINDINGS: Right ankle: The ankle mortise is symmetric and intact. The cortices are intact. Joint spaces are preserved. No acute fracture or dislocation. Right foot: Redemonstration of amputation of the first ray to the level of the first tarsometatarsal joint. Redemonstration of subacute fracture with incomplete union at the proximal second metatarsal shaft with extensive callus formation about the proximal aspect of the distal fracture component. This is  similar to 01/21/2023 radiographs. Moderate third and mild fourth distal metatarsal shaft callus formation from subacute to chronic fractures. There is again third greater than fourth fracture medial displacement of the distal fracture component. There is lucency suggesting a wound at the dorsal medial aspect of the midfoot at the level of the navicular and medial cuneiform. No definitive cortical erosion is seen. Note is made on prior MRI of edema within the distal phalanx of the second toe; no cortical erosion is appreciated, and the appearance is unchanged from 01/21/2023 radiograph. IMPRESSION: 1. No definitive cortical erosion is seen to suggest osteomyelitis. Note is made on prior MRI of edema within the distal phalanx of the second toe; no cortical erosion is appreciated in this region, and the appearance is unchanged from 01/21/2023 radiograph. 2. Redemonstration of subacute fracture with incomplete union at the proximal second metatarsal shaft with extensive callus formation about the proximal aspect of the distal fracture component. This is similar to 01/21/2023 radiographs. 3. Moderate third and mild fourth distal metatarsal shaft callus formation from subacute to chronic fractures. 4. Redemonstration of amputation of the first ray to the level of the first tarsometatarsal joint. Electronically Signed   By: Neita Garnet M.D.   On: 02/11/2023 14:19   DG Ankle Complete Right  Result Date: 02/11/2023 CLINICAL DATA:  Cellulitis. Cast on for 4 weeks. When catheter is removed, right ankle and foot skin breakdown and swelling. Evaluate for osteomyelitis. EXAM: RIGHT FOOT COMPLETE - 3+ VIEW; RIGHT ANKLE - COMPLETE 3+ VIEW COMPARISON:  Right foot radiographs 09/14/2021, MRI right forefoot 09/15/2021, right foot radiographs 01/21/2023, MRI right forefoot 01/21/2023, CT right tibia and fibula 01/25/2023 FINDINGS: Right ankle: The ankle mortise is symmetric and intact. The cortices are intact. Joint spaces are  preserved. No acute fracture or dislocation. Right foot: Redemonstration of amputation of the first ray to the level of the first tarsometatarsal joint. Redemonstration of subacute fracture with incomplete union at the proximal second metatarsal shaft with extensive callus formation about the proximal aspect of the distal fracture component. This  is similar to 01/21/2023 radiographs. Moderate third and mild fourth distal metatarsal shaft callus formation from subacute to chronic fractures. There is again third greater than fourth fracture medial displacement of the distal fracture component. There is lucency suggesting a wound at the dorsal medial aspect of the midfoot at the level of the navicular and medial cuneiform. No definitive cortical erosion is seen. Note is made on prior MRI of edema within the distal phalanx of the second toe; no cortical erosion is appreciated, and the appearance is unchanged from 01/21/2023 radiograph. IMPRESSION: 1. No definitive cortical erosion is seen to suggest osteomyelitis. Note is made on prior MRI of edema within the distal phalanx of the second toe; no cortical erosion is appreciated in this region, and the appearance is unchanged from 01/21/2023 radiograph. 2. Redemonstration of subacute fracture with incomplete union at the proximal second metatarsal shaft with extensive callus formation about the proximal aspect of the distal fracture component. This is similar to 01/21/2023 radiographs. 3. Moderate third and mild fourth distal metatarsal shaft callus formation from subacute to chronic fractures. 4. Redemonstration of amputation of the first ray to the level of the first tarsometatarsal joint. Electronically Signed   By: Neita Garnet M.D.   On: 02/11/2023 14:19    Pending Labs Unresulted Labs (From admission, onward)     Start     Ordered   02/12/23 0500  CBC  Tomorrow morning,   R        02/11/23 1525   02/12/23 0500  Basic metabolic panel  Tomorrow morning,   R         02/11/23 1525   02/11/23 1524  Hemoglobin A1c  Once,   R       Comments: To assess prior glycemic control    02/11/23 1525            Vitals/Pain Today's Vitals   02/11/23 1400 02/11/23 1420 02/11/23 1452 02/11/23 1515  BP: (!) 143/108   (!) 149/99  Pulse: (!) 55   87  Resp: (!) 24   (!) 23  Temp:   98.1 F (36.7 C)   TempSrc:      SpO2: 99%   100%  Weight:      Height:      PainSc:  9       Isolation Precautions No active isolations  Medications Medications  apixaban (ELIQUIS) tablet 5 mg (has no administration in time range)  atorvastatin (LIPITOR) tablet 20 mg (has no administration in time range)  diltiazem (CARDIZEM CD) 24 hr capsule 360 mg (has no administration in time range)  furosemide (LASIX) tablet 40 mg (has no administration in time range)  isosorbide-hydrALAZINE (BIDIL) 20-37.5 MG per tablet 2 tablet (has no administration in time range)  metoprolol succinate (TOPROL-XL) 24 hr tablet 100 mg (has no administration in time range)  oxycodone (OXY-IR) immediate release capsule 5 mg (has no administration in time range)  insulin aspart (novoLOG) injection 0-15 Units (has no administration in time range)  insulin aspart (novoLOG) injection 0-5 Units (has no administration in time range)  sodium chloride flush (NS) 0.9 % injection 3 mL (has no administration in time range)  acetaminophen (TYLENOL) tablet 650 mg (has no administration in time range)    Or  acetaminophen (TYLENOL) suppository 650 mg (has no administration in time range)  ondansetron (ZOFRAN) tablet 4 mg (has no administration in time range)    Or  ondansetron (ZOFRAN) injection 4 mg (has no administration in time range)  vancomycin (VANCOREADY) IVPB 2000 mg/400 mL (has no administration in time range)  ceFEPIme (MAXIPIME) 2 g in sodium chloride 0.9 % 100 mL IVPB (has no administration in time range)  acetaminophen (TYLENOL) tablet 1,000 mg (1,000 mg Oral Given 02/11/23 1303)     Mobility walks     Focused Assessments Musculoskeletal/skin    R Recommendations: See Admitting Provider Note  Report given to:   Additional Notes:

## 2023-02-11 NOTE — Telephone Encounter (Signed)
Noted for [employer]. 

## 2023-02-11 NOTE — ED Provider Notes (Signed)
[place] EMERGENCY DEPARTMENT AT [facility] Provider Note   CSN: [id number] Arrival date & time: 02/11/23  1051     History  Chief Complaint  Patient presents with   Cellulitis    Shai Freudenthal is a 52 y.o. male.  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 He was hospitalized two weeks ago for sepsis with non-MRSA cellulitis of the R lateral LE. Initial treatment with vanc, cefepime, flagyl then transitioned to cefadroxil and doxycycline that was finished on 10/9, after discharge on 10/4. Since completing antibiotics, his dressings were changed one week ago and taken off today at her orthopedists office, where a new wound on the lateral lower leg was found, just above the lateral malleolus. The patient does note that pain developed over the last week, but he denies fevers, chills, or radiating pain.        Home Medications Prior to Admission medications   Medication Sig Start Date End Date Taking? Authorizing Provider  apixaban (ELIQUIS) 5 MG TABS tablet Take 1 tablet (5 mg total) by mouth 2 (two) times daily. 03/02/22  Yes Clegg, Amy D, NP  atorvastatin (LIPITOR) 20 MG tablet Take 1 tablet (20 mg total) by mouth at bedtime. NEEDS FOLLOW UP APPOINTMENT FOR MORE REFILLS 02/05/23  Yes Bensimhon, Bevelyn Buckles, MD  Continuous Blood Gluc Sensor (FREESTYLE LIBRE 3 SENSOR) MISC 1 each by Does not apply route every 14 (fourteen) days. 05/06/22  Yes Carlus Pavlov, MD  diltiazem (CARDIZEM CD) 360 MG 24 hr capsule Take 1 capsule (360 mg total) by mouth daily. 01/30/23 03/03/23 Yes Regalado, Belkys A, MD  furosemide (LASIX) 20 MG tablet Take 2 tablets (40 mg total) by mouth daily. 04/22/22  Yes Bensimhon, Bevelyn Buckles, MD  isosorbide-hydrALAZINE (BIDIL) 20-37.5 MG tablet Take 2 tablets by mouth 3 (three) times daily. 12/22/22  Yes Camnitz, Will Daphine Deutscher, MD   JARDIANCE 10 MG TABS tablet Take 1 tablet by mouth once daily 11/13/22  Yes Bensimhon, Bevelyn Buckles, MD  metoprolol succinate (TOPROL-XL) 100 MG 24 hr tablet Take 1 tablet (100 mg total) by mouth at bedtime. Take with or immediately following a meal. 12/24/22  Yes Tillery, Mariam Dollar, PA-C  oxycodone (OXY-IR) 5 MG capsule Take 1 capsule (5 mg total) by mouth every 6 (six) hours as needed. 02/10/23  Yes Adonis Huguenin, NP  tirzepatide [facility]) 12.5 MG/0.5ML Pen Inject 12.5 mg into the skin once a week. 11/05/22  Yes Carlus Pavlov, MD      Allergies    Fish-derived products and Shellfish allergy    Review of Systems   Review of Systems  Constitutional:  Negative for chills, fatigue and fever.  Respiratory:  Negative for cough and shortness of breath.   Cardiovascular:  Negative for chest pain.  Gastrointestinal:  Negative for abdominal pain, nausea and vomiting.  Genitourinary:  Negative for dysuria and urgency.  Musculoskeletal:  Negative for back pain and myalgias.  Skin:  Positive for wound.  Neurological:  Negative for weakness and numbness.  Psychiatric/Behavioral:  Negative for confusion.     Physical Exam Updated Vital Signs BP (!) 149/99   Pulse 87   Temp 98.1 F (36.7 C)   Resp (!) 23   Ht 6\' 8"  (2.032 m)   Wt (!) 167.8 kg   SpO2 100%   BMI 40.65 kg/m  Physical Exam Constitutional:  General: He is not in acute distress.    Appearance: Normal appearance. He is obese. He is not ill-appearing.  Cardiovascular:     Rate and Rhythm: Normal rate. Rhythm irregular.     Pulses: Normal pulses.  Pulmonary:     Effort: Pulmonary effort is normal.     Breath sounds: Normal breath sounds.  Abdominal:     General: Abdomen is flat.     Palpations: Abdomen is soft.     Tenderness: There is no abdominal tenderness.  Musculoskeletal:     Right lower leg: No edema.     Left lower leg: No edema.  Skin:    General: Skin is warm.     Capillary Refill: Capillary refill  takes less than 2 seconds.     Findings: Lesion present.  Neurological:     General: No focal deficit present.     Mental Status: He is alert and oriented to person, place, and time.  Psychiatric:        Mood and Affect: Mood normal.        Behavior: Behavior normal.     ED Results / Procedures / Treatments   Labs (all labs ordered are listed, but only abnormal results are displayed) Labs Reviewed  CBC WITH DIFFERENTIAL/PLATELET - Abnormal; Notable for the following components:      Result Value   WBC 10.8 (*)    MCH 25.9 (*)    RDW 15.9 (*)    Platelets 424 (*)    All other components within normal limits  BASIC METABOLIC PANEL - Abnormal; Notable for the following components:   Glucose, Bld 155 (*)    BUN 29 (*)    Creatinine, Ser 2.19 (*)    GFR, Estimated 36 (*)    All other components within normal limits  SEDIMENTATION RATE - Abnormal; Notable for the following components:   Sed Rate 54 (*)    All other components within normal limits  C-REACTIVE PROTEIN - Abnormal; Notable for the following components:   CRP 7.4 (*)    All other components within normal limits  HEMOGLOBIN A1C    EKG None  Radiology DG Foot Complete Right  Result Date: 02/11/2023 CLINICAL DATA:  Cellulitis. Cast on for 4 weeks. When catheter is removed, right ankle and foot skin breakdown and swelling. Evaluate for osteomyelitis. EXAM: RIGHT FOOT COMPLETE - 3+ VIEW; RIGHT ANKLE - COMPLETE 3+ VIEW COMPARISON:  Right foot radiographs 09/14/2021, MRI right forefoot 09/15/2021, right foot radiographs 01/21/2023, MRI right forefoot 01/21/2023, CT right tibia and fibula 01/25/2023 FINDINGS: Right ankle: The ankle mortise is symmetric and intact. The cortices are intact. Joint spaces are preserved. No acute fracture or dislocation. Right foot: Redemonstration of amputation of the first ray to the level of the first tarsometatarsal joint. Redemonstration of subacute fracture with incomplete union at the  proximal second metatarsal shaft with extensive callus formation about the proximal aspect of the distal fracture component. This is similar to 01/21/2023 radiographs. Moderate third and mild fourth distal metatarsal shaft callus formation from subacute to chronic fractures. There is again third greater than fourth fracture medial displacement of the distal fracture component. There is lucency suggesting a wound at the dorsal medial aspect of the midfoot at the level of the navicular and medial cuneiform. No definitive cortical erosion is seen. Note is made on prior MRI of edema within the distal phalanx of the second toe; no cortical erosion is appreciated, and the appearance is unchanged from 01/21/2023 radiograph.  IMPRESSION: 1. No definitive cortical erosion is seen to suggest osteomyelitis. Note is made on prior MRI of edema within the distal phalanx of the second toe; no cortical erosion is appreciated in this region, and the appearance is unchanged from 01/21/2023 radiograph. 2. Redemonstration of subacute fracture with incomplete union at the proximal second metatarsal shaft with extensive callus formation about the proximal aspect of the distal fracture component. This is similar to 01/21/2023 radiographs. 3. Moderate third and mild fourth distal metatarsal shaft callus formation from subacute to chronic fractures. 4. Redemonstration of amputation of the first ray to the level of the first tarsometatarsal joint. Electronically Signed   By: Neita Garnet M.D.   On: 02/11/2023 14:19   DG Ankle Complete Right  Result Date: 02/11/2023 CLINICAL DATA:  Cellulitis. Cast on for 4 weeks. When catheter is removed, right ankle and foot skin breakdown and swelling. Evaluate for osteomyelitis. EXAM: RIGHT FOOT COMPLETE - 3+ VIEW; RIGHT ANKLE - COMPLETE 3+ VIEW COMPARISON:  Right foot radiographs 09/14/2021, MRI right forefoot 09/15/2021, right foot radiographs 01/21/2023, MRI right forefoot 01/21/2023, CT right  tibia and fibula 01/25/2023 FINDINGS: Right ankle: The ankle mortise is symmetric and intact. The cortices are intact. Joint spaces are preserved. No acute fracture or dislocation. Right foot: Redemonstration of amputation of the first ray to the level of the first tarsometatarsal joint. Redemonstration of subacute fracture with incomplete union at the proximal second metatarsal shaft with extensive callus formation about the proximal aspect of the distal fracture component. This is similar to 01/21/2023 radiographs. Moderate third and mild fourth distal metatarsal shaft callus formation from subacute to chronic fractures. There is again third greater than fourth fracture medial displacement of the distal fracture component. There is lucency suggesting a wound at the dorsal medial aspect of the midfoot at the level of the navicular and medial cuneiform. No definitive cortical erosion is seen. Note is made on prior MRI of edema within the distal phalanx of the second toe; no cortical erosion is appreciated, and the appearance is unchanged from 01/21/2023 radiograph. IMPRESSION: 1. No definitive cortical erosion is seen to suggest osteomyelitis. Note is made on prior MRI of edema within the distal phalanx of the second toe; no cortical erosion is appreciated in this region, and the appearance is unchanged from 01/21/2023 radiograph. 2. Redemonstration of subacute fracture with incomplete union at the proximal second metatarsal shaft with extensive callus formation about the proximal aspect of the distal fracture component. This is similar to 01/21/2023 radiographs. 3. Moderate third and mild fourth distal metatarsal shaft callus formation from subacute to chronic fractures. 4. Redemonstration of amputation of the first ray to the level of the first tarsometatarsal joint. Electronically Signed   By: Neita Garnet M.D.   On: 02/11/2023 14:19    Procedures Procedures    Medications Ordered in ED Medications   apixaban (ELIQUIS) tablet 5 mg (has no administration in time range)  atorvastatin (LIPITOR) tablet 20 mg (has no administration in time range)  diltiazem (CARDIZEM CD) 24 hr capsule 360 mg (has no administration in time range)  furosemide (LASIX) tablet 40 mg (has no administration in time range)  isosorbide-hydrALAZINE (BIDIL) 20-37.5 MG per tablet 2 tablet (has no administration in time range)  metoprolol succinate (TOPROL-XL) 24 hr tablet 100 mg (has no administration in time range)  oxycodone (OXY-IR) immediate release capsule 5 mg (has no administration in time range)  insulin aspart (novoLOG) injection 0-15 Units (has no administration in time range)  insulin  aspart (novoLOG) injection 0-5 Units (has no administration in time range)  sodium chloride flush (NS) 0.9 % injection 3 mL (has no administration in time range)  acetaminophen (TYLENOL) tablet 650 mg (has no administration in time range)    Or  acetaminophen (TYLENOL) suppository 650 mg (has no administration in time range)  ondansetron (ZOFRAN) tablet 4 mg (has no administration in time range)    Or  ondansetron (ZOFRAN) injection 4 mg (has no administration in time range)  vancomycin (VANCOREADY) IVPB 2000 mg/400 mL (has no administration in time range)  ceFEPIme (MAXIPIME) 2 g in sodium chloride 0.9 % 100 mL IVPB (has no administration in time range)  acetaminophen (TYLENOL) tablet 1,000 mg (1,000 mg Oral Given 02/11/23 1303)    ED Course/ Medical Decision Making/ A&P                                 Medical Decision Making 52 yo M with PMH T2DM, osteomyelitis of R great toe with subsequent amputation, recent cellulitis with hospitalization earlier this month now with possible reinfection. Pt was evaluated in orthopedists office this morning and a new area of purulent drainage was noted on the R LE, somewhat distal to the original area of concern. Today the patient is feeling well, much better than initial presentation when  he was septic. No concerns of sepsis today. Patient is only reporting pain. On evaluation, there does appear to be progression of his wound from imaging taken last week at the clinic. There is only very mild leukocytosis. CRP is moderately elevated, not as severe as initial illness. ESR is elevated beyond initial illness. With recent infection, hospital stay and antibiotic exposure, new purulence per evaluation from his orthopedist, discussed admission for IV antibiotics as well as MRI to rule out abscess (study pending) with hospitalist service who will accept. Vancomycin and cefepime ordered in ED.  Amount and/or Complexity of Data Reviewed Labs: ordered. Radiology: ordered.  Risk OTC drugs. Decision regarding hospitalization.          Final Clinical Impression(s) / ED Diagnoses Final diagnoses:  Cellulitis of right leg    Rx / DC Orders ED Discharge Orders     None         Katheran James, DO 02/11/23 1535    Gwyneth Sprout, MD 02/12/23 2240

## 2023-02-11 NOTE — Progress Notes (Addendum)
MD notified about HR and rhythm. Okay to give metoprolol early if HR does not return to normal after daily meds are given. Pt resting comfortably in bed, no distress or chest pain.

## 2023-02-11 NOTE — H&P (Signed)
History and Physical    Randy [name] WUJ:[id number] DOB: 05/20/1970 DOA: 02/11/2023  PCP: Pcp, No  Patient coming from: Dr. Audrie Lia office  I have personally briefly reviewed patient's old medical records in [facility]  Chief Complaint: New purulent discharge/cellulitis right ankle  HPI: Randy [name] is a 52 y.o. 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  Reportedly, patient was doing well and cellulitis appeared very well at the time of discharge.  He followed up with Dr. Lajoyce Corners about a week ago and was placed in the cast.  When he followed up again at his office, after removal of the cast, he was noted to have purulent discharge and cellulitis now involving the right ankle.  Dr. Lajoyce Corners sent him to the emergency department to be admitted for IV antibiotics and MRI of the right lower extremity to rule out osteomyelitis.  Patient has no complaints at all.  He denies any worsening pain, fever, chills, sweating or any other complaint.  He does endorse that the cellulitis has now extended to the right ankle which is new to him.  2 sisters are at the bedside.  ED Course: Upon arrival, diastolic blood pressure slightly elevated but otherwise hemodynamically stable.  Interestingly, his leukocytosis is better than what it was 2 weeks ago.  Creatinine at baseline.  Slightly hyperglycemic.  ED physician has ordered cefepime, Zosyn as well as vancomycin.  Hospitalist were called for admission.  MRI ordered and pending.  Review of Systems: As per HPI otherwise negative.    Past  Medical History:  Diagnosis Date   CKD (chronic kidney disease) stage 3, GFR 30-59 ml/min (HCC)    baseline Cr 1.5-1.7   Decreased visual acuity    Left eye, resolved - hypertensive retinopathy   Diabetes mellitus 2012   Type II   Hilar adenopathy    on CT scan 02/2010, on rpt scan stable/improved.   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   Systolic murmur    Vitamin D deficiency     Past Surgical History:  Procedure Laterality Date   AMPUTATION Right 09/17/2021   Procedure: First ray amputation;  Surgeon: Nadara Mustard, MD;  Location: [facility] OR;  Service: Orthopedics;  Laterality: Right;   APPLICATION OF WOUND VAC Right 10/10/2021   Procedure: APPLICATION OF WOUND VAC;  Surgeon: Nadara Mustard, MD;  Location: [facility] OR;  Service: Orthopedics;  Laterality: Right;   ATRIAL FIBRILLATION ABLATION N/A 10/02/2022   Procedure: ATRIAL FIBRILLATION ABLATION;  Surgeon: Regan Lemming, MD;  Location: [facility] INVASIVE CV LAB;  Service: Cardiovascular;  Laterality: N/A;   CARDIOVERSION N/A 01/31/2021   Procedure: CARDIOVERSION;  Surgeon: Dolores Patty, MD;  Location: [facility] ENDOSCOPY;  Service: Cardiovascular;  Laterality: N/A;   CARDIOVERSION N/A 07/04/2021   Procedure: CARDIOVERSION;  Surgeon:  Bensimhon, Bevelyn Buckles, MD;  Location: [facility] ENDOSCOPY;  Service: Cardiovascular;  Laterality: N/A;   hospitalization  11/2008   malignant HTN, nl SPEP/UPEP, neg ANCA panel, nl C3/4, neg anti GBM Ab, neg Hep A/B/C, nl renal US, nl PTH, neg HIV   I & D EXTREMITY Right 10/10/2021   Procedure: RIGHT FOOT DEBRIDEMENT;  Surgeon: Nadara Mustard, MD;  Location: [facility] OR;  Service: Orthopedics;  Laterality: Right;   RIGHT/LEFT HEART  CATH AND CORONARY ANGIOGRAPHY N/A 01/29/2021   Procedure: RIGHT/LEFT HEART CATH AND CORONARY ANGIOGRAPHY;  Surgeon: Dolores Patty, MD;  Location: [facility] INVASIVE CV LAB;  Service: Cardiovascular;  Laterality: N/A;   TEE WITHOUT CARDIOVERSION N/A 01/31/2021   Procedure: TRANSESOPHAGEAL ECHOCARDIOGRAM (TEE);  Surgeon: Dolores Patty, MD;  Location: [facility] ENDOSCOPY;  Service: Cardiovascular;  Laterality: N/A;   TEE WITHOUT CARDIOVERSION N/A 10/02/2022   Procedure: TRANSESOPHAGEAL ECHOCARDIOGRAM;  Surgeon: Regan Lemming, MD;  Location: [facility] INVASIVE CV LAB;  Service: Cardiovascular;  Laterality: N/A;   US ECHOCARDIOGRAPHY  11/2008   LVsys fxn EF 50%, mild MR, normal LV size, neg ANA, neg cryoglobulins   US ECHOCARDIOGRAPHY  2011   severe LVH, EF 40%, LA mildly dilated, PA pressure moderately increased     reports that he has quit smoking. His smoking use included cigars. He has never used smokeless tobacco. He reports that he does not currently use alcohol. He reports that he does not use drugs.  Allergies  Allergen Reactions   Fish-Derived Products Anaphylaxis   Shellfish Allergy Anaphylaxis    Family History  Problem Relation Age of Onset   Hypertension Mother    Hypertension Father    Diabetes Father    Diabetes Sister    Hypertension Brother    Kidney disease Paternal Grandmother        ESRD   Coronary artery disease Neg Hx    Stroke Neg Hx    Cancer Neg Hx     Prior to Admission medications   Medication Sig Start Date End Date Taking? Authorizing Provider  apixaban (ELIQUIS) 5 MG TABS tablet Take 1 tablet (5 mg total) by mouth 2 (two) times daily. 03/02/22  Yes Clegg, Amy D, NP  atorvastatin (LIPITOR) 20 MG tablet Take 1 tablet (20 mg total) by mouth at bedtime. NEEDS FOLLOW UP APPOINTMENT FOR MORE REFILLS 02/05/23  Yes Bensimhon, Bevelyn Buckles, MD  Continuous Blood Gluc Sensor (FREESTYLE LIBRE 3 SENSOR) MISC 1 each by Does not apply route every 14 (fourteen) days. 05/06/22  Yes  Carlus Pavlov, MD  diltiazem (CARDIZEM CD) 360 MG 24 hr capsule Take 1 capsule (360 mg total) by mouth daily. 01/30/23 03/03/23 Yes Regalado, Belkys A, MD  furosemide (LASIX) 20 MG tablet Take 2 tablets (40 mg total) by mouth daily. 04/22/22  Yes Bensimhon, Bevelyn Buckles, MD  isosorbide-hydrALAZINE (BIDIL) 20-37.5 MG tablet Take 2 tablets by mouth 3 (three) times daily. 12/22/22  Yes Camnitz, Will Daphine Deutscher, MD  JARDIANCE 10 MG TABS tablet Take 1 tablet by mouth once daily 11/13/22  Yes Bensimhon, Bevelyn Buckles, MD  metoprolol succinate (TOPROL-XL) 100 MG 24 hr tablet Take 1 tablet (100 mg total) by mouth at bedtime. Take with or immediately following a meal. 12/24/22  Yes Tillery, Mariam Dollar, PA-C  oxycodone (OXY-IR) 5 MG capsule Take 1 capsule (5 mg total) by mouth every 6 (six) hours as needed. 02/10/23  Yes Adonis Huguenin, NP  tirzepatide [facility]) 12.5 MG/0.5ML Pen Inject 12.5 mg into the skin once a week.  11/05/22  Yes Carlus Pavlov, MD    Physical Exam: Vitals:   02/11/23 1058 02/11/23 1233 02/11/23 1400 02/11/23 1452  BP: (!) 152/117  (!) 143/108   Pulse: 64  (!) 55   Resp: 18  (!) 24   Temp: 98.6 F (37 C)   98.1 F (36.7 C)  TempSrc: Oral     SpO2: 100%  99%   Weight:  (!) 167.8 kg    Height:  6\' 8"  (2.032 m)      Constitutional: NAD, calm, comfortable Vitals:   02/11/23 1058 02/11/23 1233 02/11/23 1400 02/11/23 1452  BP: (!) 152/117  (!) 143/108   Pulse: 64  (!) 55   Resp: 18  (!) 24   Temp: 98.6 F (37 C)   98.1 F (36.7 C)  TempSrc: Oral     SpO2: 100%  99%   Weight:  (!) 167.8 kg    Height:  6\' 8"  (2.032 m)     Eyes: PERRL, lids and conjunctivae normal, morbidly obese ENMT: Mucous membranes are moist. Posterior pharynx clear of any exudate or lesions.Normal dentition.  Neck: normal, supple, no masses, no thyromegaly Respiratory: clear to auscultation bilaterally, no wheezing, no crackles. Normal respiratory effort. No accessory muscle use.  Cardiovascular: Regular  rate and rhythm, no murmurs / rubs / gallops. No extremity edema. 2+ pedal pulses. No carotid bruits.  Abdomen: no tenderness, no masses palpated. No hepatosplenomegaly. Bowel sounds positive.  Musculoskeletal: no clubbing / cyanosis. No joint deformity upper and lower extremities. Good ROM, no contractures. Normal muscle tone.  Skin: Extensive cellulitic changes in lower third of the right lower extremity including hindfoot on the right side as well as the ankle area.  I did not appreciate any purulence.  It was nontender.  No warmth. Neurologic: CN 2-12 grossly intact. Sensation intact, DTR normal. Strength 5/5 in all 4.  Psychiatric: Normal judgment and insight. Alert and oriented x 3. Normal mood.    Labs on Admission: I have personally reviewed following labs and imaging studies  CBC: Recent Labs  Lab 02/11/23 1232  WBC 10.8*  NEUTROABS 6.9  HGB 13.7  HCT 42.8  MCV 80.9  PLT 424*   Basic Metabolic Panel: Recent Labs  Lab 02/11/23 1232  NA 137  K 4.2  CL 105  CO2 26  GLUCOSE 155*  BUN 29*  CREATININE 2.19*  CALCIUM 9.0   GFR: Estimated Creatinine Clearance: 71.2 mL/min (A) (by C-G formula based on SCr of 2.19 mg/dL (H)). Liver Function Tests: No results for input(s): "AST", "ALT", "ALKPHOS", "BILITOT", "PROT", "ALBUMIN" in the last 168 hours. No results for input(s): "LIPASE", "AMYLASE" in the last 168 hours. No results for input(s): "AMMONIA" in the last 168 hours. Coagulation Profile: No results for input(s): "INR", "PROTIME" in the last 168 hours. Cardiac Enzymes: No results for input(s): "CKTOTAL", "CKMB", "CKMBINDEX", "TROPONINI" in the last 168 hours. BNP (last 3 results) No results for input(s): "PROBNP" in the last 8760 hours. HbA1C: No results for input(s): "HGBA1C" in the last 72 hours. CBG: No results for input(s): "GLUCAP" in the last 168 hours. Lipid Profile: No results for input(s): "CHOL", "HDL", "LDLCALC", "TRIG", "CHOLHDL", "LDLDIRECT" in the  last 72 hours. Thyroid Function Tests: No results for input(s): "TSH", "T4TOTAL", "FREET4", "T3FREE", "THYROIDAB" in the last 72 hours. Anemia Panel: No results for input(s): "VITAMINB12", "FOLATE", "FERRITIN", "TIBC", "IRON", "RETICCTPCT" in the last 72 hours. Urine analysis:    Component Value Date/Time   COLORURINE AMBER (A) 01/21/2023 4098  APPEARANCEUR HAZY (A) 01/21/2023 0137   LABSPEC 1.028 01/21/2023 0137   PHURINE 5.0 01/21/2023 0137   GLUCOSEU >=500 (A) 01/21/2023 0137   HGBUR SMALL (A) 01/21/2023 0137   HGBUR trace-intact 03/19/2008 1441   BILIRUBINUR NEGATIVE 01/21/2023 0137   BILIRUBINUR Negative 05/29/2011 1609   KETONESUR 5 (A) 01/21/2023 0137   PROTEINUR >=300 (A) 01/21/2023 0137   UROBILINOGEN 0.2 05/29/2011 1609   UROBILINOGEN 1.0 03/12/2010 1140   NITRITE NEGATIVE 01/21/2023 0137   LEUKOCYTESUR NEGATIVE 01/21/2023 0137    Radiological Exams on Admission: DG Foot Complete Right  Result Date: 02/11/2023 CLINICAL DATA:  Cellulitis. Cast on for 4 weeks. When catheter is removed, right ankle and foot skin breakdown and swelling. Evaluate for osteomyelitis. EXAM: RIGHT FOOT COMPLETE - 3+ VIEW; RIGHT ANKLE - COMPLETE 3+ VIEW COMPARISON:  Right foot radiographs 09/14/2021, MRI right forefoot 09/15/2021, right foot radiographs 01/21/2023, MRI right forefoot 01/21/2023, CT right tibia and fibula 01/25/2023 FINDINGS: Right ankle: The ankle mortise is symmetric and intact. The cortices are intact. Joint spaces are preserved. No acute fracture or dislocation. Right foot: Redemonstration of amputation of the first ray to the level of the first tarsometatarsal joint. Redemonstration of subacute fracture with incomplete union at the proximal second metatarsal shaft with extensive callus formation about the proximal aspect of the distal fracture component. This is similar to 01/21/2023 radiographs. Moderate third and mild fourth distal metatarsal shaft callus formation from subacute  to chronic fractures. There is again third greater than fourth fracture medial displacement of the distal fracture component. There is lucency suggesting a wound at the dorsal medial aspect of the midfoot at the level of the navicular and medial cuneiform. No definitive cortical erosion is seen. Note is made on prior MRI of edema within the distal phalanx of the second toe; no cortical erosion is appreciated, and the appearance is unchanged from 01/21/2023 radiograph. IMPRESSION: 1. No definitive cortical erosion is seen to suggest osteomyelitis. Note is made on prior MRI of edema within the distal phalanx of the second toe; no cortical erosion is appreciated in this region, and the appearance is unchanged from 01/21/2023 radiograph. 2. Redemonstration of subacute fracture with incomplete union at the proximal second metatarsal shaft with extensive callus formation about the proximal aspect of the distal fracture component. This is similar to 01/21/2023 radiographs. 3. Moderate third and mild fourth distal metatarsal shaft callus formation from subacute to chronic fractures. 4. Redemonstration of amputation of the first ray to the level of the first tarsometatarsal joint. Electronically Signed   By: Neita Garnet M.D.   On: 02/11/2023 14:19   DG Ankle Complete Right  Result Date: 02/11/2023 CLINICAL DATA:  Cellulitis. Cast on for 4 weeks. When catheter is removed, right ankle and foot skin breakdown and swelling. Evaluate for osteomyelitis. EXAM: RIGHT FOOT COMPLETE - 3+ VIEW; RIGHT ANKLE - COMPLETE 3+ VIEW COMPARISON:  Right foot radiographs 09/14/2021, MRI right forefoot 09/15/2021, right foot radiographs 01/21/2023, MRI right forefoot 01/21/2023, CT right tibia and fibula 01/25/2023 FINDINGS: Right ankle: The ankle mortise is symmetric and intact. The cortices are intact. Joint spaces are preserved. No acute fracture or dislocation. Right foot: Redemonstration of amputation of the first ray to the level of  the first tarsometatarsal joint. Redemonstration of subacute fracture with incomplete union at the proximal second metatarsal shaft with extensive callus formation about the proximal aspect of the distal fracture component. This is similar to 01/21/2023 radiographs. Moderate third and mild fourth distal metatarsal shaft callus formation  from subacute to chronic fractures. There is again third greater than fourth fracture medial displacement of the distal fracture component. There is lucency suggesting a wound at the dorsal medial aspect of the midfoot at the level of the navicular and medial cuneiform. No definitive cortical erosion is seen. Note is made on prior MRI of edema within the distal phalanx of the second toe; no cortical erosion is appreciated, and the appearance is unchanged from 01/21/2023 radiograph. IMPRESSION: 1. No definitive cortical erosion is seen to suggest osteomyelitis. Note is made on prior MRI of edema within the distal phalanx of the second toe; no cortical erosion is appreciated in this region, and the appearance is unchanged from 01/21/2023 radiograph. 2. Redemonstration of subacute fracture with incomplete union at the proximal second metatarsal shaft with extensive callus formation about the proximal aspect of the distal fracture component. This is similar to 01/21/2023 radiographs. 3. Moderate third and mild fourth distal metatarsal shaft callus formation from subacute to chronic fractures. 4. Redemonstration of amputation of the first ray to the level of the first tarsometatarsal joint. Electronically Signed   By: Neita Garnet M.D.   On: 02/11/2023 14:19    Assessment/Plan Principal Problem:   Cellulitis of right lower extremity Active Problems:   Morbid obesity (HCC)   Type 2 diabetes mellitus with complication, without long-term current use of insulin (HCC)   Permanent atrial fibrillation (HCC)   HTN (hypertension)   Hypercoagulable state due to persistent atrial  fibrillation (HCC)   Chronic kidney disease, stage 3b (HCC)   Recurrent cellulitis of the right lower extremity: Sent to the hospital by Dr. Lajoyce Corners for concern of possible osteomyelitis.  I am going to continue only vancomycin.  MRI right lower extremity is ordered and pending.  Dr. Lajoyce Corners aware and will follow along during this hospitalization.  Type 2 diabetes mellitus: Patient is on [place] and Jardiance at home.  Holding Combine as we do and holding Jardiance due to infection.  Start on SSI.  Update hemoglobin A1c.  Chronic/permanent atrial fibrillation/hypercoagulable state due to anticoagulation: Rates controlled.  Resume diltiazem, Toprol-XL and Eliquis.  Hyperlipidemia: Resume atorvastatin.  Essential hypertension: Elevated.  Resume BiDil.  Morbid obesity: Weight loss and dietary modification counseled.  CKD stage IIIb: Baseline creatinine appears to be ranging from 1.9 -2.1.  Currently 2.2, very close to his baseline.  Does not meet criteria for AKI.  DVT prophylaxis: Eliquis Code Status: Full code Family Communication: 2 sisters present at bedside.  Plan of care discussed with patient in length and he verbalized understanding and agreed with it. Disposition Plan: Will likely be discharged in next 2 to 3 days if no abscess/osteomyelitis. Consults called: Dr. Myrle Sheng MD [facility]  *Please note that this is a verbal dictation therefore any spelling or grammatical errors are due to the "[employer] One" system interpretation.  Please page via Amion and do not message via secure chat for urgent patient care matters. Secure chat can be used for non urgent patient care matters. 02/11/2023, 3:26 PM  To contact the attending provider between 7A-7P or the covering provider during after hours 7P-7A, please log into the web site [email or web address]

## 2023-02-11 NOTE — Progress Notes (Signed)
Pharmacy Antibiotic Note  Randy [name] is a 52 y.o. male for which pharmacy has been consulted for vancomycin dosing for cellulitis and concern for osteomyelitis .  Patient with a history of DM, osteomyelitis and amputation, AF, HF, HTN, CVA, CKD. Patient presenting with new purulent discharge/cellulitis right ankle.  SCr 2.19 - near baseline WBC 10.8; T 98.1; HR 77; RR 19  Plan: Cefepime given x 1 in the ED Vancomycin 2000 mg q24hr (eAUC 477.9) unless change in renal function Monitor WBC, fever, renal function, cultures De-escalate when able Levels at steady state  Height: 6\' 8"  (203.2 cm) Weight: (!) 167.8 kg (370 lb) IBW/kg (Calculated) : 96  Temp (24hrs), Avg:98.4 F (36.9 C), Min:98.1 F (36.7 C), Max:98.6 F (37 C)  Recent Labs  Lab 02/11/23 1232  WBC 10.8*  CREATININE 2.19*    Estimated Creatinine Clearance: 71.2 mL/min (A) (by C-G formula based on SCr of 2.19 mg/dL (H)).    Allergies  Allergen Reactions   Fish-Derived Products Anaphylaxis   Shellfish Allergy Anaphylaxis   Microbiology results: Pending  Thank you for allowing pharmacy to be a part of this patient's care.  Delmar Landau, PharmD, BCPS 02/11/2023 3:32 PM ED Clinical Pharmacist -  [phone]

## 2023-02-12 DIAGNOSIS — N1832 Chronic kidney disease, stage 3b: Secondary | ICD-10-CM | POA: Diagnosis not present

## 2023-02-12 DIAGNOSIS — I4821 Permanent atrial fibrillation: Secondary | ICD-10-CM | POA: Diagnosis not present

## 2023-02-12 DIAGNOSIS — I158 Other secondary hypertension: Secondary | ICD-10-CM

## 2023-02-12 DIAGNOSIS — L03115 Cellulitis of right lower limb: Secondary | ICD-10-CM | POA: Diagnosis not present

## 2023-02-12 DIAGNOSIS — E118 Type 2 diabetes mellitus with unspecified complications: Secondary | ICD-10-CM

## 2023-02-12 DIAGNOSIS — D6869 Other thrombophilia: Secondary | ICD-10-CM

## 2023-02-12 LAB — CBC
HCT: 40.7 % (ref 39.0–52.0)
Hemoglobin: 12.9 g/dL — ABNORMAL LOW (ref 13.0–17.0)
MCH: 25.2 pg — ABNORMAL LOW (ref 26.0–34.0)
MCHC: 31.7 g/dL (ref 30.0–36.0)
MCV: 79.6 fL — ABNORMAL LOW (ref 80.0–100.0)
Platelets: 372 10*3/uL (ref 150–400)
RBC: 5.11 MIL/uL (ref 4.22–5.81)
RDW: 15.6 % — ABNORMAL HIGH (ref 11.5–15.5)
WBC: 9.9 10*3/uL (ref 4.0–10.5)
nRBC: 0 % (ref 0.0–0.2)

## 2023-02-12 LAB — GLUCOSE, CAPILLARY
Glucose-Capillary: 92 mg/dL (ref 70–99)
Glucose-Capillary: 141 mg/dL — ABNORMAL HIGH (ref 70–99)
Glucose-Capillary: 149 mg/dL — ABNORMAL HIGH (ref 70–99)
Glucose-Capillary: 123 mg/dL — ABNORMAL HIGH (ref 70–99)

## 2023-02-12 LAB — BASIC METABOLIC PANEL
Anion gap: 10 (ref 5–15)
BUN: 23 mg/dL — ABNORMAL HIGH (ref 6–20)
CO2: 22 mmol/L (ref 22–32)
Calcium: 8.7 mg/dL — ABNORMAL LOW (ref 8.9–10.3)
Chloride: 102 mmol/L (ref 98–111)
Creatinine, Ser: 1.89 mg/dL — ABNORMAL HIGH (ref 0.61–1.24)
GFR, Estimated: 43 mL/min — ABNORMAL LOW (ref >60)
Glucose, Bld: 104 mg/dL — ABNORMAL HIGH (ref 70–99)
Potassium: 3.5 mmol/L (ref 3.5–5.1)
Sodium: 134 mmol/L — ABNORMAL LOW (ref 135–145)

## 2023-02-12 MED ORDER — METRONIDAZOLE 500 MG/100ML IV SOLN
500.0000 mg | Freq: Two times a day (BID) | INTRAVENOUS | Status: DC
  Administered 2023-02-12 – 2023-02-16 (×10): 500 mg via INTRAVENOUS
  Filled 2023-02-12 (×10): qty 100

## 2023-02-12 MED ORDER — SODIUM CHLORIDE 0.9 % IV SOLN
2.0000 g | Freq: Three times a day (TID) | INTRAVENOUS | Status: DC
  Administered 2023-02-12 – 2023-02-17 (×16): 2 g via INTRAVENOUS
  Filled 2023-02-12 (×16): qty 12.5

## 2023-02-12 MED ORDER — VANCOMYCIN HCL 1750 MG/350ML IV SOLN
1750.0000 mg | INTRAVENOUS | Status: DC
  Administered 2023-02-12: 1750 mg via INTRAVENOUS
  Filled 2023-02-12 (×2): qty 350

## 2023-02-12 MED ORDER — OXYCODONE HCL 5 MG PO TABS
5.0000 mg | ORAL_TABLET | ORAL | Status: DC | PRN
  Administered 2023-02-12 – 2023-02-13 (×2): 5 mg via ORAL
  Filled 2023-02-12 (×3): qty 1

## 2023-02-12 NOTE — Plan of Care (Signed)
[facility] Tonsillectomy/Adenoidectomy, Postoperative PEDS care plan entered in error.

## 2023-02-12 NOTE — Progress Notes (Signed)
PROGRESS NOTE  Randy [name] WUJ:[id number] DOB: [birth year]   PCP: Pcp, No  Patient is from: Home.  Independently ambulates at baseline.  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 Lajoyce Corners) due to right ankle cellulitis with abscess and purulent drainage.  Patient saw Dr. Lajoyce Corners for follow-up after cast removal and noted to have purulent discharge and cellulitis.  He was sent to ED for IV antibiotics and imaging to rule out osteomyelitis.  In ED, slightly hypertensive.  Not febrile.  WBC 10.8 (19.7 on 10/2).  CRP 7.4. Cr 2.19.  Foot and ankle x-ray without osteomyelitis but subacute fracture fourth bone fractures with callus formation.  Started on IV vancomycin.  MRI foot ordered   Subjective: Seen and examined earlier this morning.  No major events overnight of this morning.  No complaints.  He denies purulent drainage from his wound.  No numbness or tingling.  MRI showed 0.6 x 3.9 x 4.5 cm new abscess and mild to moderate edema.   Objective: Vitals:   02/11/23 2315 02/12/23 0353 02/12/23 0356 02/12/23 0724  BP: 124/81  (!) 133/100 131/88  Pulse: 89  92 (!) 102  Resp: 18  19 18   Temp: 98.6 F (37 C)  98.5 F (36.9 C) 98.6 F (37 C)  TempSrc: Oral  Oral Oral  SpO2: 97%  96% 98%  Weight:  (!) 161.8 kg    Height:        Examination:  GENERAL: No apparent distress.  Nontoxic. HEENT: MMM.  Vision and hearing grossly intact.  NECK: Supple.  No apparent JVD.  RESP:  No IWOB.  Fair aeration bilaterally. CVS:  RRR. Heart sounds normal.  ABD/GI/GU: BS+. Abd soft, NTND.  MSK/EXT:  Moves extremities.  Ulceration over lateral aspect of right ankle.  No  purulent drainage.  No significant tenderness. S/p right great toe amputation. SKIN: no apparent skin lesion or wound NEURO: Awake, alert and oriented appropriately.  No apparent focal neuro deficit. PSYCH: Calm. Normal affect.   Procedures:  None  Microbiology summarized: None  Assessment and plan: Right foot/ankle cellulitis with abscess: Patient is diabetic.  Recently treated for RLE cellulitis and discharged home.  Concerned about purulent drainage at orthopedics office.  MRI foot showed abscess but no osteomyelitis.  CRP elevated to 7.4.  ESR 54.  Mild leukocytosis, better than prior value. -Continue IV vancomycin -Add IV cefepime and Flagyl  -Orthopedic surgery following  Chronic HFimpEF: TTE on 9/20 with LVEF of 50 to 55%, severe LVH and RVSP of 34.4.  On p.o. Lasix at home.  Appears bulimic on exam except for lower extremity edema from infection. -Continue p.o. Lasix -Monitor fluid and respiratory status  NIDDM-2 with hyperglycemia, HLD and CKD-3B: A1c 6.5%. Recent Labs  Lab 02/11/23 1711 02/11/23 2031 02/12/23 0727 02/12/23 1153  GLUCAP 137* 115* 92 141*  -Continue SSI-moderate -Hold home meds -Continues to statin  CKD-3B: At baseline Recent Labs    01/21/23 1323 01/22/23 1200 01/23/23 0801 01/24/23 0543 01/25/23 9562 01/26/23 0458 01/27/23 0434 01/28/23 0527 02/11/23 1232 02/12/23 0755  BUN 17 20 22* 20 16 15 15 16  29* 23*  CREATININE 2.06* 2.38* 2.51* 2.14* 1.87* 1.85* 2.01*  1.91* 2.19* 1.89*  -Continue monitoring  Permanent atrial fibrillation: Rate controlled -Continue Cardizem, Toprol and Eliquis -Will switch to IV heparin if surgical plan   Essential hypertension: Elevated.   -Continue home BiDil, Toprol-XL and Cardizem    Morbid obesity Body mass index is 39.19 kg/m.           DVT prophylaxis:   apixaban (ELIQUIS) tablet 5 mg  Code Status: Full code Family Communication: None at bedside Level of care: Med-Surg Status is:  Inpatient Remains inpatient appropriate because: Right foot and ankle cellulitis with abscess   Final disposition: Likely home Consultants:  Orthopedic surgery  55 minutes with more than 50% spent in reviewing records, counseling patient/family and coordinating care.   Sch Meds:  Scheduled Meds:  apixaban  5 mg Oral BID   atorvastatin  20 mg Oral QHS   diltiazem  360 mg Oral Daily   furosemide  40 mg Oral Daily   insulin aspart  0-15 Units Subcutaneous TID WC   insulin aspart  0-5 Units Subcutaneous QHS   isosorbide-hydrALAZINE  2 tablet Oral TID   metoprolol succinate  100 mg Oral QHS   sodium chloride flush  3 mL Intravenous Q12H   Continuous Infusions:  ceFEPime (MAXIPIME) IV 2 g (02/12/23 1025)   metronidazole 500 mg (02/12/23 0836)   vancomycin     PRN Meds:.acetaminophen **OR** acetaminophen, gadobutrol, ondansetron **OR** ondansetron (ZOFRAN) IV, oxyCODONE  Antimicrobials: Anti-infectives (From admission, onward)    Start     Dose/Rate Route Frequency Ordered Stop   02/12/23 1800  vancomycin (VANCOREADY) IVPB 1750 mg/350 mL        1,750 mg 175 mL/hr over 120 Minutes Intravenous Every 24 hours 02/12/23 0907     02/12/23 1000  ceFEPIme (MAXIPIME) 2 g in sodium chloride 0.9 % 100 mL IVPB        2 g 200 mL/hr over 30 Minutes Intravenous Every 8 hours 02/12/23 0900     02/12/23 0900  metroNIDAZOLE (FLAGYL) IVPB 500 mg        500 mg 100 mL/hr over 60 Minutes Intravenous Every 12 hours 02/12/23 0809     02/11/23 1712  vancomycin (VANCOREADY) IVPB 2000 mg/400 mL  Status:  Discontinued        2,000 mg 200 mL/hr over 120 Minutes Intravenous Every 24 hours 02/11/23 1712 02/12/23 0907   02/11/23 1545  ceFEPIme (MAXIPIME) 2 g in sodium chloride 0.9 % 100 mL IVPB  Status:  Discontinued        2 g 200 mL/hr over 30 Minutes Intravenous Every 8 hours 02/11/23 1532 02/11/23 1707   02/11/23 1530  vancomycin (VANCOREADY) IVPB 2000 mg/400 mL  Status:  Discontinued        2,000  mg 200 mL/hr over 120 Minutes Intravenous  Once 02/11/23 1526 02/11/23 1712        I have personally reviewed the following labs and images: CBC: Recent Labs  Lab 02/11/23 1232 02/12/23 0755  WBC 10.8* 9.9  NEUTROABS 6.9  --   HGB 13.7 12.9*  HCT 42.8 40.7  MCV 80.9 79.6*  PLT 424* 372   BMP &GFR Recent Labs  Lab 02/11/23 1232 02/12/23 0755  NA 137 134*  K 4.2 3.5  CL 105 102  CO2 26 22  GLUCOSE 155* 104*  BUN 29* 23*  CREATININE 2.19* 1.89*  CALCIUM 9.0 8.7*   Estimated Creatinine Clearance: 80.9 mL/min (A) (by C-G formula based on SCr of 1.89 mg/dL (H)). Liver & Pancreas:  No results for input(s): "AST", "ALT", "ALKPHOS", "BILITOT", "PROT", "ALBUMIN" in the last 168 hours. No results for input(s): "LIPASE", "AMYLASE" in the last 168 hours. No results for input(s): "AMMONIA" in the last 168 hours. Diabetic: Recent Labs    02/11/23 1232  HGBA1C 6.5*   Recent Labs  Lab 02/11/23 1711 02/11/23 2031 02/12/23 0727 02/12/23 1153  GLUCAP 137* 115* 92 141*   Cardiac Enzymes: No results for input(s): "CKTOTAL", "CKMB", "CKMBINDEX", "TROPONINI" in the last 168 hours. No results for input(s): "PROBNP" in the last 8760 hours. Coagulation Profile: No results for input(s): "INR", "PROTIME" in the last 168 hours. Thyroid Function Tests: No results for input(s): "TSH", "T4TOTAL", "FREET4", "T3FREE", "THYROIDAB" in the last 72 hours. Lipid Profile: No results for input(s): "CHOL", "HDL", "LDLCALC", "TRIG", "CHOLHDL", "LDLDIRECT" in the last 72 hours. Anemia Panel: No results for input(s): "VITAMINB12", "FOLATE", "FERRITIN", "TIBC", "IRON", "RETICCTPCT" in the last 72 hours. Urine analysis:    Component Value Date/Time   COLORURINE AMBER (A) 01/21/2023 0137   APPEARANCEUR HAZY (A) 01/21/2023 0137   LABSPEC 1.028 01/21/2023 0137   PHURINE 5.0 01/21/2023 0137   GLUCOSEU >=500 (A) 01/21/2023 0137   HGBUR SMALL (A) 01/21/2023 0137   HGBUR trace-intact 03/19/2008 1441    BILIRUBINUR NEGATIVE 01/21/2023 0137   BILIRUBINUR Negative 05/29/2011 1609   KETONESUR 5 (A) 01/21/2023 0137   PROTEINUR >=300 (A) 01/21/2023 0137   UROBILINOGEN 0.2 05/29/2011 1609   UROBILINOGEN 1.0 03/12/2010 1140   NITRITE NEGATIVE 01/21/2023 0137   LEUKOCYTESUR NEGATIVE 01/21/2023 0137   Sepsis Labs: Invalid input(s): "PROCALCITONIN", "LACTICIDVEN"  Microbiology: No results found for this or any previous visit (from the past 240 hour(s)).  Radiology Studies: MR ANKLE RIGHT W WO CONTRAST  Result Date: 02/12/2023 CLINICAL DATA:  Cellulitis versus abscess. EXAM: MRI OF THE RIGHT ANKLE WITHOUT AND WITH CONTRAST TECHNIQUE: Multiplanar, multisequence MR imaging of the ankle was performed before and after the administration of intravenous contrast. CONTRAST:  10mL GADAVIST GADOBUTROL 1 MMOL/ML IV SOLN COMPARISON:  Right ankle and foot radiographs 02/11/2023; CT right tibia and fibula 01/25/2023; MRI right ankle and foot 01/21/2023 FINDINGS: TENDONS Peroneal: The peroneus longus and brevis tendons are intact. Posteromedial: Intact tibialis posterior, flexor digitorum longus, and flexor hallucis longus tendons. Anterior: The tibialis anterior, extensor hallucis longus, and extensor digitorum longus tendons are intact. Achilles: Intact. Plantar Fascia: Intact. LIGAMENTS Lateral: The anterior and posterior talofibular, anterior and posterior tibiofibular, and calcaneofibular ligaments are intact. Medial: The tibiotalar deep deltoid and tibial spring ligaments are intact. CARTILAGE Ankle Joint: There is mild to moderate thinning of the anteromedial aspect of the tibiotalar cartilage. Subtalar Joints/Sinus Tarsi: Mild-to-moderate middle and posterior subtalar joint cartilage thinning.Mild edema within the sinus tarsi. Bones: Mild-to-moderate navicular-medial cuneiform cartilage thinning and subchondral marrow edema. No acute fracture. No cortical erosion is seen. Moderate talonavicular and  tarsometatarsal joint space narrowing and peripheral osteophytosis diffusely. 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 Other: There is moderate edema, enhancement, and swelling of the soft tissues lateral to the distal fibular diaphysis and metaphysis, with a shallow ulceration of the skin and a portion of the subcutaneous fat measuring up to only 2 mm in depth but approximately 1.9 cm in AP dimension and 3.3 cm in craniocaudal dimension (axial series 2, image 7 and coronal series 9, image 18). No sinus tract is seen extending to bone. The adjacent distal fibular cortex remains intact and no marrow edema is seen within the distal  fibula to indicate acute osteomyelitis. Within the slightly more distal and posterolateral aspect of the lateral ankle at the level of the distal tip of the fibula, there is an additional small skin ulceration measuring up to 1.6 cm in AP dimension and 0.5 cm in craniocaudal dimension (axial series 8, image 17 and coronal series 9, image 13) with an underlying decreased T1 increased T2 signal fluid collection with thin peripheral enhancement and mild thin internal septations, overall measuring up to 0.6 x 3.9 x 4.5 cm (transverse by AP by craniocaudal). This is compatible with a small abscess. This is new from 01/21/2023 MRI. There is mild-to-moderate edema within the peripheral aspect of the extensor digitorum longus muscle and portions of the flexor hallucis longus and peroneus brevis muscles. The tarsal tunnel is unremarkable. IMPRESSION: IMPRESSION 1. There are two regions of skin ulceration of the lateral ankle, at the level of the distal fibular diaphysis at the level of the tip of the fibula. 2. Within the region of the more distal skin ulcer, there is an underlying small abscess measuring up to 0.6 x 3.9 x 4.5 cm (transverse by AP by craniocaudal). This is new from 01/21/2023 MRI. No underlying distal fibular  cortical erosion or marrow edema to indicate acute osteomyelitis. 3. Mild-to-moderate edema within the peripheral aspect of the extensor digitorum longus muscle and portions of the flexor hallucis longus and peroneus brevis muscles. This is nonspecific and may be neurogenic, reactive, or infectious. Electronically Signed   By: Neita Garnet M.D.   On: 02/12/2023 10:28      Emillie Chasen T. Daltyn Degroat [facility]  If 7PM-7AM, please contact night-coverage [email or web address] 02/12/2023, 2:21 PM home.  Independently ambulates at baseline.

## 2023-02-12 NOTE — Progress Notes (Signed)
Pharmacy Antibiotic Note  Randy [name] is a 52 y.o. male with medical history significant for previous osteomyelitis and  involving the right first toe who was recently hospitalized 01/21/2023 - 01/29/2023 for right lower extremity cellulitis treated with IV antibiotics and discharged on cefadroxil and doxycycline. During follow-up after removal of the cast, noted to have purulent discharge and cellulitis now involving the right ankle. Per Dr. Lajoyce Corners, admission for IV antibiotics and assessment was recommended. Pharmacy has been consulted for cefepime dosing.  Renal function with AKI on CKD, bsl Scr ~1.8-1.9, currently up at 2.19 on admission. Will adjust vancomycin accordingly and continue to monitor.  Plan: Cefepime 2g IV Q8h  Adjust to vancomycin 1750 IV Q24h (eAUC 434, Vd 0.5) Trend WBC, fever, renal function F/u cultures, clinical progress, levels as indicated De-escalate when able   Height: 6\' 8"  (203.2 cm) Weight: (!) 161.8 kg (356 lb 11.3 oz) IBW/kg (Calculated) : 96  Temp (24hrs), Avg:98.5 F (36.9 C), Min:98.1 F (36.7 C), Max:98.6 F (37 C)  Recent Labs  Lab 02/11/23 1232  WBC 10.8*  CREATININE 2.19*    Estimated Creatinine Clearance: 69.8 mL/min (A) (by C-G formula based on SCr of 2.19 mg/dL (H)).    Allergies  Allergen Reactions   Fish-Derived Products Anaphylaxis   Shellfish Allergy Anaphylaxis    Thank you for allowing pharmacy to be a part of this patient's care.  Thelma Barge, PharmD Clinical Pharmacist

## 2023-02-13 DIAGNOSIS — I4821 Permanent atrial fibrillation: Secondary | ICD-10-CM | POA: Diagnosis not present

## 2023-02-13 DIAGNOSIS — N1832 Chronic kidney disease, stage 3b: Secondary | ICD-10-CM | POA: Diagnosis not present

## 2023-02-13 DIAGNOSIS — L02415 Cutaneous abscess of right lower limb: Secondary | ICD-10-CM | POA: Diagnosis not present

## 2023-02-13 DIAGNOSIS — L03115 Cellulitis of right lower limb: Secondary | ICD-10-CM | POA: Diagnosis not present

## 2023-02-13 LAB — CBC
HCT: 35.7 % — ABNORMAL LOW (ref 39.0–52.0)
Hemoglobin: 11.2 g/dL — ABNORMAL LOW (ref 13.0–17.0)
MCH: 24.7 pg — ABNORMAL LOW (ref 26.0–34.0)
MCHC: 31.4 g/dL (ref 30.0–36.0)
MCV: 78.6 fL — ABNORMAL LOW (ref 80.0–100.0)
Platelets: 353 10*3/uL (ref 150–400)
RBC: 4.54 MIL/uL (ref 4.22–5.81)
RDW: 15.8 % — ABNORMAL HIGH (ref 11.5–15.5)
WBC: 10.7 10*3/uL — ABNORMAL HIGH (ref 4.0–10.5)
nRBC: 0 % (ref 0.0–0.2)

## 2023-02-13 LAB — RENAL FUNCTION PANEL
Albumin: 2.3 g/dL — ABNORMAL LOW (ref 3.5–5.0)
Anion gap: 16 — ABNORMAL HIGH (ref 5–15)
BUN: 23 mg/dL — ABNORMAL HIGH (ref 6–20)
CO2: 19 mmol/L — ABNORMAL LOW (ref 22–32)
Calcium: 9.6 mg/dL (ref 8.9–10.3)
Chloride: 102 mmol/L (ref 98–111)
Creatinine, Ser: 1.74 mg/dL — ABNORMAL HIGH (ref 0.61–1.24)
GFR, Estimated: 47 mL/min — ABNORMAL LOW (ref >60)
Glucose, Bld: 118 mg/dL — ABNORMAL HIGH (ref 70–99)
Phosphorus: 3.9 mg/dL (ref 2.5–4.6)
Potassium: 3.5 mmol/L (ref 3.5–5.1)
Sodium: 137 mmol/L (ref 135–145)

## 2023-02-13 LAB — MAGNESIUM: Magnesium: 1.9 mg/dL (ref 1.7–2.4)

## 2023-02-13 LAB — GLUCOSE, CAPILLARY
Glucose-Capillary: 108 mg/dL — ABNORMAL HIGH (ref 70–99)
Glucose-Capillary: 136 mg/dL — ABNORMAL HIGH (ref 70–99)
Glucose-Capillary: 126 mg/dL — ABNORMAL HIGH (ref 70–99)
Glucose-Capillary: 132 mg/dL — ABNORMAL HIGH (ref 70–99)

## 2023-02-13 LAB — VANCOMYCIN, RANDOM
Vancomycin Rm: 14 ug/mL
Vancomycin Rm: 12 ug/mL

## 2023-02-13 MED ORDER — HYDROMORPHONE HCL 1 MG/ML IJ SOLN: 0.5000 mg | INTRAMUSCULAR | Status: DC | PRN

## 2023-02-13 MED ORDER — VANCOMYCIN HCL 1250 MG/250ML IV SOLN
1250.0000 mg | Freq: Two times a day (BID) | INTRAVENOUS | Status: DC
  Administered 2023-02-14 – 2023-02-17 (×7): 1250 mg via INTRAVENOUS
  Filled 2023-02-13 (×7): qty 250

## 2023-02-13 MED ORDER — VANCOMYCIN HCL 1250 MG/250ML IV SOLN
1250.0000 mg | Freq: Once | INTRAVENOUS | Status: AC
  Administered 2023-02-13: 1250 mg via INTRAVENOUS
  Filled 2023-02-13: qty 250

## 2023-02-13 MED ORDER — SENNOSIDES-DOCUSATE SODIUM 8.6-50 MG PO TABS
1.0000 | ORAL_TABLET | Freq: Every day | ORAL | Status: DC
  Administered 2023-02-13 – 2023-02-16 (×3): 1 via ORAL
  Filled 2023-02-13 (×5): qty 1

## 2023-02-13 MED ORDER — OXYCODONE HCL 5 MG PO TABS
10.0000 mg | ORAL_TABLET | ORAL | Status: DC | PRN
  Administered 2023-02-13 – 2023-02-15 (×7): 10 mg via ORAL
  Filled 2023-02-13 (×8): qty 2

## 2023-02-13 NOTE — Progress Notes (Signed)
On-call provider, A. Virgel Manifold contacted about changing PRN Oxy interval and/or adding additional pain med as pt still c/o 8/10 breakthrough pain after taking PRN Oxy 5mg  around 2.5 hrs ago. Provider changed PRN to q 4 hrs, okay to give a dose now.

## 2023-02-13 NOTE — Consult Note (Addendum)
WOC Nurse Consult Note: this consult is performed remotely after review of EMR records and chat with bedside nurse; this patient is familiar to [facility] team from previous admission; consulted at that time for R 2nd toe ulcer; this time admitted for R lateral ankle wound, has been seen by Dr. Lajoyce Corners; currently being treated with IV antibiotics with possible debridement by Dr. Lajoyce Corners later in week  Reason for Consult: R ankle wound  Wound type: full thickness R lateral ankle  Pressure Injury POA: NA  Measurement: see nursing flowsheet; per Dr. Burna Sis office note 02/11/2023  lateral aspect of the right ankle which extends from the foot over the lateral malleolus. There is purulent drainage cellulitis blistering ulcers the wound measures 15 x 20 cm  Wound bed: see nursing flowsheet  Drainage (amount, consistency, odor) purulent drainage per Dr. Lajoyce Corners note  Periwound: see nursing flowsheet  Dressing procedure/placement/frequency: Cleanse R lateral ankle/foot wound with Vashe wound cleanser [facility] [place] [phone]), apply Vashe moistened gauze to wound bed twice daily. Cover with dry gauze and Kerlix roll gauze beginning just above toes and ending right below knee.   Clean R 2nd toe ulcer with NS, apply a small piece of silver hydrofiber to wound bed and secure with foam or dry gauze and tape.    POC discussed with bedside nurse. Dr. Lajoyce Corners continues to follow these wounds. WOC team will not follow at this time. Re-consult if further needs arise.   Thank you,    Priscella Mann MSN, RN-BC, [employer] [phone]

## 2023-02-13 NOTE — Progress Notes (Signed)
Pharmacy Antibiotic Note  Randy [name] is a 52 y.o. male with medical history significant for previous osteomyelitis and  involving the right first toe who was recently hospitalized 01/21/2023 - 01/29/2023 for right lower extremity cellulitis treated with IV antibiotics and discharged on cefadroxil and doxycycline. During follow-up after removal of the cast, noted to have purulent discharge and cellulitis now involving the right ankle. Per Dr. Lajoyce Corners, admission for IV antibiotics and assessment was recommended. Pharmacy has been consulted for cefepime dosing.  Renal function back to baseline, Scr 1/74 Vancomycin Random 14 mcg/mL (~14hours post vancomycin 1750mg )  Plan: Cefepime 2g IV Q8h  Adjust to vancomycin 250mg  q12hr (eAUC 500) Trend WBC, fever, renal function F/u cultures, clinical progress, levels as indicated De-escalate when able   Height: 6\' 8"  (203.2 cm) Weight: (!) 162 kg (357 lb 2.3 oz) IBW/kg (Calculated) : 96  Temp (24hrs), Avg:98.2 F (36.8 C), Min:98 F (36.7 C), Max:98.4 F (36.9 C)  Recent Labs  Lab 02/11/23 1232 02/12/23 0755 02/13/23 0333 02/13/23 0843  WBC 10.8* 9.9 10.7*  --   CREATININE 2.19* 1.89* 1.74*  --   VANCORANDOM  --   --   --  14    Estimated Creatinine Clearance: 87.9 mL/min (A) (by C-G formula based on SCr of 1.74 mg/dL (H)).    Allergies  Allergen Reactions   Fish-Derived Products Anaphylaxis   Shellfish Allergy Anaphylaxis    Thank you for allowing pharmacy to be a part of this patient's care.  Tomie China, PharmD Clinical Pharmacist  Please check AMION for all [facility] Pharmacy numbers After 10:00 PM, call Main Pharmacy [phone]

## 2023-02-13 NOTE — Progress Notes (Signed)
PROGRESS NOTE  Randy [name] DOB: 1970/06/02   PCP: Pcp, No  Patient is from: Home.  Independently ambulates at baseline.  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 Lajoyce Corners) due to right ankle cellulitis with abscess and purulent drainage.  Patient saw Dr. Lajoyce Corners for follow-up after cast removal and noted to have purulent discharge and cellulitis.  He was sent to ED for IV antibiotics and imaging to rule out osteomyelitis.  In ED, slightly hypertensive.  Not febrile.  WBC 10.8 (19.7 on 10/2).  CRP 7.4. Cr 2.19.  Foot and ankle x-ray without osteomyelitis but subacute fracture fourth bone fractures with callus formation.  Started on IV vancomycin.  MRI foot ordered.  MRI showed small abscess measuring 0.6 x 3.9 x 4.5 cm under skin ulceration over lateral ankle but no osteomyelitis.  MRI also showed mild to moderate edema within the peripheral aspect of the extensor digitorum longus muscle and portion of the flexor hallucis longus and peroneus brevis muscles.  Remains on IV antibiotics per orthopedic surgery.   Subjective: Seen and examined earlier this morning.  No major events overnight of this morning.  Had some pain earlier this morning.  His pain improved later in the day.  He says he was able to bear weight and ambulate to the bathroom.  No complaints.  Objective: Vitals:   02/13/23 0401 02/13/23 0450 02/13/23 0737 02/13/23 1214  BP: 123/69  (!) 133/99 126/86  Pulse: 75  (!) 101 87  Resp: 18  18 18   Temp: 98.4 F (36.9 C)  98 F (36.7 C) 98.3 F (36.8 C)  TempSrc:   Oral   SpO2: 96%  97% 97%  Weight:  (!) 162 kg    Height:         Examination:  GENERAL: No apparent distress.  Sitting on bedside chair. HEENT: MMM.  Vision and hearing grossly intact.  NECK: Supple.  No apparent JVD.  RESP:  No IWOB.  Fair aeration bilaterally. CVS:  RRR. Heart sounds normal.  ABD/GI/GU: BS+. Abd soft, NTND.  MSK/EXT:  Moves extremities.  Ulceration over lateral aspect of right ankle.  No purulent drainage.  No significant tenderness. S/p right great toe amputation. SKIN: no apparent skin lesion or wound NEURO: Awake, alert and oriented appropriately.  No apparent focal neuro deficit. PSYCH: Calm. Normal affect.   Procedures:  None  Microbiology summarized: None  Assessment and plan: Right foot/ankle cellulitis with abscess: Patient is diabetic.  Recently treated for RLE cellulitis and discharged home.  Concerned about purulent drainage at orthopedics office.  MRI foot showed abscess but no osteomyelitis.  CRP elevated to 7.4.  ESR 54.  Mild leukocytosis, better than prior value. -Continue IV vancomycin, cefepime and Flagyl  -Orthopedic surgery following -Multimodal pain control and bowel regimen -PT  Chronic HFimpEF: TTE on 9/20 with LVEF of 50 to 55%, severe LVH and RVSP of 34.4.  On p.o. Lasix at home.  Appears bulimic on exam except for lower extremity edema from infection. -Continue p.o. Lasix -Monitor fluid and respiratory status  NIDDM-2 with hyperglycemia, HLD and CKD-3B: A1c 6.5%. Recent Labs  Lab 02/12/23 1153 02/12/23 1616 02/12/23 2116 02/13/23 8119  02/13/23 1212  GLUCAP 141* 149* 123* 108* 136*  -Continue SSI-moderate -Hold home meds -Continues to statin  CKD-3B: Creatinine improved. Recent Labs    01/22/23 1200 01/23/23 0801 01/24/23 0543 01/25/23 7829 01/26/23 0458 01/27/23 0434 01/28/23 0527 02/11/23 1232 02/12/23 0755 02/13/23 0333  BUN 20 22* 20 16 15 15 16  29* 23* 23*  CREATININE 2.38* 2.51* 2.14* 1.87* 1.85* 2.01* 1.91* 2.19* 1.89* 1.74*  -Continue monitoring  Permanent atrial  fibrillation: Rate controlled -Continue Cardizem, Toprol and Eliquis   Essential hypertension: Improved. -Continue home BiDil, Toprol-XL and Cardizem    Morbid obesity Body mass index is 39.23 kg/m.           DVT prophylaxis:   apixaban (ELIQUIS) tablet 5 mg  Code Status: Full code Family Communication: None at bedside Level of care: Med-Surg Status is: Inpatient Remains inpatient appropriate because: Right foot and ankle cellulitis with abscess   Final disposition: Likely home Consultants:  Orthopedic surgery  55 minutes with more than 50% spent in reviewing records, counseling patient/family and coordinating care.   Sch Meds:  Scheduled Meds:  apixaban  5 mg Oral BID   atorvastatin  20 mg Oral QHS   diltiazem  360 mg Oral Daily   furosemide  40 mg Oral Daily   insulin aspart  0-15 Units Subcutaneous TID WC   insulin aspart  0-5 Units Subcutaneous QHS   isosorbide-hydrALAZINE  2 tablet Oral TID   metoprolol succinate  100 mg Oral QHS   senna-docusate  1 tablet Oral Daily   sodium chloride flush  3 mL Intravenous Q12H   Continuous Infusions:  ceFEPime (MAXIPIME) IV 2 g (02/13/23 0859)   metronidazole 500 mg (02/13/23 0904)   vancomycin     [START ON 02/14/2023] vancomycin     PRN Meds:.acetaminophen **OR** acetaminophen, gadobutrol, HYDROmorphone (DILAUDID) injection, ondansetron **OR** ondansetron (ZOFRAN) IV, oxyCODONE  Antimicrobials: Anti-infectives (From admission, onward)    Start     Dose/Rate Route Frequency Ordered Stop   02/14/23 0300  vancomycin (VANCOREADY) IVPB 1250 mg/250 mL        1,250 mg 166.7 mL/hr over 90 Minutes Intravenous Every 12 hours 02/13/23 1326     02/13/23 1415  vancomycin (VANCOREADY) IVPB 1250 mg/250 mL        1,250 mg 166.7 mL/hr over 90 Minutes Intravenous  Once 02/13/23 1326     02/12/23 1800  vancomycin (VANCOREADY) IVPB 1750 mg/350 mL  Status:  Discontinued        1,750 mg 175 mL/hr over 120 Minutes Intravenous Every  24 hours 02/12/23 0907 02/13/23 1326   02/12/23 1000  ceFEPIme (MAXIPIME) 2 g in sodium chloride 0.9 % 100 mL IVPB        2 g 200 mL/hr over 30 Minutes Intravenous Every 8 hours 02/12/23 0900     02/12/23 0900  metroNIDAZOLE (FLAGYL) IVPB 500 mg        500 mg 100 mL/hr over 60 Minutes Intravenous Every 12 hours 02/12/23 0809     02/11/23 1712  vancomycin (VANCOREADY) IVPB 2000 mg/400 mL  Status:  Discontinued        2,000 mg 200 mL/hr over 120 Minutes Intravenous Every 24 hours 02/11/23 1712 02/12/23 0907   02/11/23 1545  ceFEPIme (MAXIPIME) 2 g in sodium chloride 0.9 % 100 mL IVPB  Status:  Discontinued        2 g 200 mL/hr over 30 Minutes Intravenous Every 8 hours 02/11/23 1532 02/11/23 1707   02/11/23 1530  vancomycin (  VANCOREADY) IVPB 2000 mg/400 mL  Status:  Discontinued        2,000 mg 200 mL/hr over 120 Minutes Intravenous  Once 02/11/23 1526 02/11/23 1712        I have personally reviewed the following labs and images: CBC: Recent Labs  Lab 02/11/23 1232 02/12/23 0755 02/13/23 0333  WBC 10.8* 9.9 10.7*  NEUTROABS 6.9  --   --   HGB 13.7 12.9* 11.2*  HCT 42.8 40.7 35.7*  MCV 80.9 79.6* 78.6*  PLT 424* 372 353   BMP &GFR Recent Labs  Lab 02/11/23 1232 02/12/23 0755 02/13/23 0333  NA 137 134* 137  K 4.2 3.5 3.5  CL 105 102 102  CO2 26 22 19*  GLUCOSE 155* 104* 118*  BUN 29* 23* 23*  CREATININE 2.19* 1.89* 1.74*  CALCIUM 9.0 8.7* 9.6  MG  --   --  1.9  PHOS  --   --  3.9   Estimated Creatinine Clearance: 87.9 mL/min (A) (by C-G formula based on SCr of 1.74 mg/dL (H)). Liver & Pancreas: Recent Labs  Lab 02/13/23 0333  ALBUMIN 2.3*   No results for input(s): "LIPASE", "AMYLASE" in the last 168 hours. No results for input(s): "AMMONIA" in the last 168 hours. Diabetic: Recent Labs    02/11/23 1232  HGBA1C 6.5*   Recent Labs  Lab 02/12/23 1153 02/12/23 1616 02/12/23 2116 02/13/23 0734 02/13/23 1212  GLUCAP 141* 149* 123* 108* 136*   Cardiac  Enzymes: No results for input(s): "CKTOTAL", "CKMB", "CKMBINDEX", "TROPONINI" in the last 168 hours. No results for input(s): "PROBNP" in the last 8760 hours. Coagulation Profile: No results for input(s): "INR", "PROTIME" in the last 168 hours. Thyroid Function Tests: No results for input(s): "TSH", "T4TOTAL", "FREET4", "T3FREE", "THYROIDAB" in the last 72 hours. Lipid Profile: No results for input(s): "CHOL", "HDL", "LDLCALC", "TRIG", "CHOLHDL", "LDLDIRECT" in the last 72 hours. Anemia Panel: No results for input(s): "VITAMINB12", "FOLATE", "FERRITIN", "TIBC", "IRON", "RETICCTPCT" in the last 72 hours. Urine analysis:    Component Value Date/Time   COLORURINE AMBER (A) 01/21/2023 0137   APPEARANCEUR HAZY (A) 01/21/2023 0137   LABSPEC 1.028 01/21/2023 0137   PHURINE 5.0 01/21/2023 0137   GLUCOSEU >=500 (A) 01/21/2023 0137   HGBUR SMALL (A) 01/21/2023 0137   HGBUR trace-intact 03/19/2008 1441   BILIRUBINUR NEGATIVE 01/21/2023 0137   BILIRUBINUR Negative 05/29/2011 1609   KETONESUR 5 (A) 01/21/2023 0137   PROTEINUR >=300 (A) 01/21/2023 0137   UROBILINOGEN 0.2 05/29/2011 1609   UROBILINOGEN 1.0 03/12/2010 1140   NITRITE NEGATIVE 01/21/2023 0137   LEUKOCYTESUR NEGATIVE 01/21/2023 0137   Sepsis Labs: Invalid input(s): "PROCALCITONIN", "LACTICIDVEN"  Microbiology: No results found for this or any previous visit (from the past 240 hour(s)).  Radiology Studies: No results found.    Wei Poplaski T. Shondra Capps [facility]  If 7PM-7AM, please contact night-coverage [email or web address] 02/13/2023, 1:58 PM home.  Independently ambulates at baseline.

## 2023-02-13 NOTE — Consult Note (Signed)
ORTHOPAEDIC CONSULTATION  REQUESTING PHYSICIAN: Almon Hercules, MD  Chief Complaint: New abscess right lateral ankle.  HPI: Randy [name] is a 52 y.o. male who presents with new abscess lateral malleolus right ankle.  Patient was previously hospitalized 2 weeks ago for cellulitis in the right calf.  Patient was seen in follow-up in the office and the cellulitis and ulceration was resolving nicely however patient presented with a new abscess over the lateral right ankle.  Past Medical History:  Diagnosis Date   CKD (chronic kidney disease) stage 3, GFR 30-59 ml/min (HCC)    baseline Cr 1.5-1.7   Decreased visual acuity    Left eye, resolved - hypertensive retinopathy   Diabetes mellitus 2012   Type II   Hilar adenopathy    on CT scan 02/2010, on rpt scan stable/improved.   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   Systolic murmur    Vitamin D deficiency    Past Surgical History:  Procedure Laterality Date   AMPUTATION Right 09/17/2021   Procedure: First ray amputation;  Surgeon: Nadara Mustard, MD;  Location: [facility] OR;  Service: Orthopedics;  Laterality: Right;   APPLICATION OF WOUND VAC Right 10/10/2021   Procedure: APPLICATION OF WOUND VAC;  Surgeon: Nadara Mustard, MD;  Location: [facility] OR;  Service: Orthopedics;  Laterality: Right;   ATRIAL FIBRILLATION ABLATION N/A 10/02/2022   Procedure: ATRIAL FIBRILLATION ABLATION;  Surgeon: Regan Lemming, MD;  Location: [facility] INVASIVE CV LAB;  Service: Cardiovascular;  Laterality: N/A;   CARDIOVERSION N/A 01/31/2021   Procedure: CARDIOVERSION;  Surgeon: Dolores Patty, MD;  Location: [facility] ENDOSCOPY;  Service: Cardiovascular;  Laterality: N/A;    CARDIOVERSION N/A 07/04/2021   Procedure: CARDIOVERSION;  Surgeon: Dolores Patty, MD;  Location: [facility] ENDOSCOPY;  Service: Cardiovascular;  Laterality: N/A;   hospitalization  11/2008   malignant HTN, nl SPEP/UPEP, neg ANCA panel, nl C3/4, neg anti GBM Ab, neg Hep A/B/C, nl renal US, nl PTH, neg HIV   I & D EXTREMITY Right 10/10/2021   Procedure: RIGHT FOOT DEBRIDEMENT;  Surgeon: Nadara Mustard, MD;  Location: [name] OR;  Service: Orthopedics;  Laterality: Right;   RIGHT/LEFT HEART CATH AND CORONARY ANGIOGRAPHY N/A 01/29/2021   Procedure: RIGHT/LEFT HEART CATH AND CORONARY ANGIOGRAPHY;  Surgeon: Dolores Patty, MD;  Location: [facility] INVASIVE CV LAB;  Service: Cardiovascular;  Laterality: N/A;   TEE WITHOUT CARDIOVERSION N/A 01/31/2021   Procedure: TRANSESOPHAGEAL ECHOCARDIOGRAM (TEE);  Surgeon: Dolores Patty, MD;  Location: [facility] ENDOSCOPY;  Service: Cardiovascular;  Laterality: N/A;   TEE WITHOUT CARDIOVERSION N/A 10/02/2022   Procedure: TRANSESOPHAGEAL ECHOCARDIOGRAM;  Surgeon: Regan Lemming, MD;  Location: [facility] INVASIVE CV LAB;  Service: Cardiovascular;  Laterality: N/A;   US ECHOCARDIOGRAPHY  11/2008   LVsys fxn EF 50%, mild MR, normal LV size, neg ANA, neg cryoglobulins   US ECHOCARDIOGRAPHY  2011   severe LVH, EF 40%, LA mildly dilated, PA pressure moderately increased   Social History   Socioeconomic History   Marital status: Married    Spouse name: Kaydn Shim   Number of children: 3   Years of  education: Not on file   Highest education level: Bachelor's degree (e.g., BA, AB, BS)  Occupational History   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   Other Topics Concern   Not on file  Social History Narrative   Newly married, 2012, 1 daughter, 1 son   No injectable steroid cycles   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, 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)   Regular exercise-yes   Social Determinants of Health   Financial Resource Strain: Low Risk  (01/27/2021)   Overall Financial Resource Strain (CARDIA)    Difficulty of Paying Living Expenses: Not hard at all  Food Insecurity: No Food Insecurity (02/11/2023)   Hunger Vital Sign    Worried About Running Out of Food in the Last Year: Never true    Ran Out of Food in the Last Year: Never true  Transportation Needs: No Transportation Needs (02/11/2023)   PRAPARE - Administrator, Civil Service (Medical): No    Lack of Transportation (Non-Medical): No  Physical Activity: Not on file  Stress: Not on file  Social Connections: Unknown (09/06/2021)   Received from [facility]   Social Network    Social Network: Not on file   Family History  Problem Relation Age of Onset   Hypertension Mother    Hypertension Father    Diabetes Father    Diabetes Sister    Hypertension Brother    Kidney disease Paternal Grandmother        ESRD   Coronary artery disease Neg Hx    Stroke Neg Hx    Cancer Neg Hx    - negative except otherwise stated in the family history section Allergies  Allergen Reactions   Fish-Derived Products Anaphylaxis   Shellfish Allergy Anaphylaxis   Prior to Admission medications   Medication Sig Start Date End Date Taking? Authorizing Provider  apixaban (ELIQUIS) 5 MG TABS tablet Take 1 tablet (5 mg total) by mouth 2 (two) times daily. 03/02/22  Yes Clegg, Amy D, NP  atorvastatin (LIPITOR) 20 MG tablet Take 1 tablet (20 mg total) by mouth at bedtime. NEEDS FOLLOW UP APPOINTMENT FOR MORE REFILLS 02/05/23  Yes Bensimhon, Bevelyn Buckles, MD  Continuous Blood Gluc Sensor (FREESTYLE LIBRE 3 SENSOR) MISC 1 each by Does not apply route every  14 (fourteen) days. 05/06/22  Yes Carlus Pavlov, MD  diltiazem (CARDIZEM CD) 360 MG 24 hr capsule Take 1 capsule (360 mg total) by mouth daily. 01/30/23 03/03/23 Yes Regalado, Belkys A, MD  furosemide (LASIX) 20 MG tablet Take 2 tablets (40 mg total) by mouth daily. 04/22/22  Yes Bensimhon, Bevelyn Buckles, MD  isosorbide-hydrALAZINE (BIDIL) 20-37.5 MG tablet Take 2 tablets by mouth 3 (three) times daily. 12/22/22  Yes Camnitz, Will Daphine Deutscher, MD  JARDIANCE 10 MG TABS tablet Take 1 tablet by mouth once daily 11/13/22  Yes Bensimhon, Bevelyn Buckles, MD  metoprolol succinate (TOPROL-XL) 100 MG 24 hr tablet Take 1 tablet (100 mg total) by mouth at bedtime. Take with or immediately following a meal. 12/24/22  Yes Tillery, Mariam Dollar, PA-C  oxycodone (OXY-IR) 5 MG capsule Take 1 capsule (5 mg total) by mouth every 6 (six) hours as needed. 02/10/23  Yes Adonis Huguenin, NP  tirzepatide [facility]) 12.5 MG/0.5ML Pen Inject 12.5 mg into the skin once a week. 11/05/22  Yes Carlus Pavlov, MD   MR ANKLE RIGHT W WO CONTRAST  Result Date: 02/12/2023 CLINICAL DATA:  Cellulitis versus abscess. EXAM: MRI OF THE RIGHT ANKLE WITHOUT AND WITH CONTRAST TECHNIQUE: Multiplanar, multisequence MR imaging of the ankle was performed before and after the administration of intravenous contrast. CONTRAST:  10mL GADAVIST GADOBUTROL 1 MMOL/ML IV SOLN COMPARISON:  Right ankle and foot radiographs 02/11/2023; CT right tibia and fibula 01/25/2023; MRI right ankle and foot 01/21/2023 FINDINGS: TENDONS Peroneal: The peroneus longus and brevis tendons are intact. Posteromedial: Intact tibialis posterior, flexor digitorum longus, and flexor hallucis longus tendons. Anterior: The tibialis anterior, extensor hallucis longus, and extensor digitorum longus tendons are intact. Achilles: Intact. Plantar Fascia: Intact. LIGAMENTS Lateral: The anterior and posterior talofibular, anterior and posterior tibiofibular, and calcaneofibular ligaments are intact.  Medial: The tibiotalar deep deltoid and tibial spring ligaments are intact. CARTILAGE Ankle Joint: There is mild to moderate thinning of the anteromedial aspect of the tibiotalar cartilage. Subtalar Joints/Sinus Tarsi: Mild-to-moderate middle and posterior subtalar joint cartilage thinning.Mild edema within the sinus tarsi. Bones: Mild-to-moderate navicular-medial cuneiform cartilage thinning and subchondral marrow edema. No acute fracture. No cortical erosion is seen. Moderate talonavicular and tarsometatarsal joint space narrowing and peripheral osteophytosis diffusely. 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 Other: There is moderate edema, enhancement, and swelling of the soft tissues lateral to the distal fibular diaphysis and metaphysis, with a shallow ulceration of the skin and a portion of the subcutaneous fat measuring up to only 2 mm in depth but approximately 1.9 cm in AP dimension and 3.3 cm in craniocaudal dimension (axial series 2, image 7 and coronal series 9, image 18). No sinus tract is seen extending to bone. The adjacent distal fibular cortex remains intact and no marrow edema is seen within the distal fibula to indicate acute osteomyelitis. Within the slightly more distal and posterolateral aspect of the lateral ankle at the level of the distal tip of the fibula, there is an additional small skin ulceration measuring up to 1.6 cm in AP dimension and 0.5 cm in craniocaudal dimension (axial series 8, image 17 and coronal series 9, image 13) with an underlying decreased T1 increased T2 signal fluid collection with thin peripheral enhancement and mild thin internal septations, overall measuring up to 0.6 x 3.9 x 4.5 cm (transverse by AP by craniocaudal). This is compatible with a small abscess. This is new from 01/21/2023 MRI. There is mild-to-moderate edema within the peripheral aspect of the extensor digitorum longus muscle  and portions of the flexor hallucis longus and peroneus brevis muscles. The tarsal tunnel is unremarkable. IMPRESSION: IMPRESSION 1. There are two regions of skin ulceration of the lateral ankle, at the level of the distal fibular diaphysis at the level of the tip of the fibula. 2. Within the region of the more distal skin ulcer, there is an underlying small abscess measuring up to 0.6 x 3.9 x 4.5 cm (transverse by AP by craniocaudal). This is new from 01/21/2023 MRI. No underlying distal fibular cortical erosion or marrow edema to indicate acute osteomyelitis. 3. Mild-to-moderate edema within the peripheral aspect of the extensor digitorum longus muscle and portions of the flexor hallucis longus and peroneus brevis muscles.  This is nonspecific and may be neurogenic, reactive, or infectious. Electronically Signed   By: Neita Garnet M.D.   On: 02/12/2023 10:28   DG Foot Complete Right  Result Date: 02/11/2023 CLINICAL DATA:  Cellulitis. Cast on for 4 weeks. When catheter is removed, right ankle and foot skin breakdown and swelling. Evaluate for osteomyelitis. EXAM: RIGHT FOOT COMPLETE - 3+ VIEW; RIGHT ANKLE - COMPLETE 3+ VIEW COMPARISON:  Right foot radiographs 09/14/2021, MRI right forefoot 09/15/2021, right foot radiographs 01/21/2023, MRI right forefoot 01/21/2023, CT right tibia and fibula 01/25/2023 FINDINGS: Right ankle: The ankle mortise is symmetric and intact. The cortices are intact. Joint spaces are preserved. No acute fracture or dislocation. Right foot: Redemonstration of amputation of the first ray to the level of the first tarsometatarsal joint. Redemonstration of subacute fracture with incomplete union at the proximal second metatarsal shaft with extensive callus formation about the proximal aspect of the distal fracture component. This is similar to 01/21/2023 radiographs. Moderate third and mild fourth distal metatarsal shaft callus formation from subacute to chronic fractures. There is again  third greater than fourth fracture medial displacement of the distal fracture component. There is lucency suggesting a wound at the dorsal medial aspect of the midfoot at the level of the navicular and medial cuneiform. No definitive cortical erosion is seen. Note is made on prior MRI of edema within the distal phalanx of the second toe; no cortical erosion is appreciated, and the appearance is unchanged from 01/21/2023 radiograph. IMPRESSION: 1. No definitive cortical erosion is seen to suggest osteomyelitis. Note is made on prior MRI of edema within the distal phalanx of the second toe; no cortical erosion is appreciated in this region, and the appearance is unchanged from 01/21/2023 radiograph. 2. Redemonstration of subacute fracture with incomplete union at the proximal second metatarsal shaft with extensive callus formation about the proximal aspect of the distal fracture component. This is similar to 01/21/2023 radiographs. 3. Moderate third and mild fourth distal metatarsal shaft callus formation from subacute to chronic fractures. 4. Redemonstration of amputation of the first ray to the level of the first tarsometatarsal joint. Electronically Signed   By: Neita Garnet M.D.   On: 02/11/2023 14:19   DG Ankle Complete Right  Result Date: 02/11/2023 CLINICAL DATA:  Cellulitis. Cast on for 4 weeks. When catheter is removed, right ankle and foot skin breakdown and swelling. Evaluate for osteomyelitis. EXAM: RIGHT FOOT COMPLETE - 3+ VIEW; RIGHT ANKLE - COMPLETE 3+ VIEW COMPARISON:  Right foot radiographs 09/14/2021, MRI right forefoot 09/15/2021, right foot radiographs 01/21/2023, MRI right forefoot 01/21/2023, CT right tibia and fibula 01/25/2023 FINDINGS: Right ankle: The ankle mortise is symmetric and intact. The cortices are intact. Joint spaces are preserved. No acute fracture or dislocation. Right foot: Redemonstration of amputation of the first ray to the level of the first tarsometatarsal joint.  Redemonstration of subacute fracture with incomplete union at the proximal second metatarsal shaft with extensive callus formation about the proximal aspect of the distal fracture component. This is similar to 01/21/2023 radiographs. Moderate third and mild fourth distal metatarsal shaft callus formation from subacute to chronic fractures. There is again third greater than fourth fracture medial displacement of the distal fracture component. There is lucency suggesting a wound at the dorsal medial aspect of the midfoot at the level of the navicular and medial cuneiform. No definitive cortical erosion is seen. Note is made on prior MRI of edema within the distal phalanx of the second toe; no cortical erosion  is appreciated, and the appearance is unchanged from 01/21/2023 radiograph. IMPRESSION: 1. No definitive cortical erosion is seen to suggest osteomyelitis. Note is made on prior MRI of edema within the distal phalanx of the second toe; no cortical erosion is appreciated in this region, and the appearance is unchanged from 01/21/2023 radiograph. 2. Redemonstration of subacute fracture with incomplete union at the proximal second metatarsal shaft with extensive callus formation about the proximal aspect of the distal fracture component. This is similar to 01/21/2023 radiographs. 3. Moderate third and mild fourth distal metatarsal shaft callus formation from subacute to chronic fractures. 4. Redemonstration of amputation of the first ray to the level of the first tarsometatarsal joint. Electronically Signed   By: Neita Garnet M.D.   On: 02/11/2023 14:19   - pertinent xrays, CT, MRI studies were reviewed and independently interpreted  Positive ROS: All other systems have been reviewed and were otherwise negative with the exception of those mentioned in the HPI and as above.  Physical Exam: General: Alert, no acute distress Psychiatric: Patient is competent for consent with normal mood and affect Lymphatic:  No axillary or cervical lymphadenopathy Cardiovascular: No pedal edema Respiratory: No cyanosis, no use of accessory musculature GI: No organomegaly, abdomen is soft and non-tender    Images:  @ENCIMAGES @  Labs:  Lab Results  Component Value Date   HGBA1C 6.5 (H) 02/11/2023   HGBA1C 5.8 (A) 12/22/2022   HGBA1C 6.4 (A) 08/12/2022   ESRSEDRATE 54 (H) 02/11/2023   ESRSEDRATE 30 (H) 01/22/2023   ESRSEDRATE 3 08/07/2020   CRP 7.4 (H) 02/11/2023   CRP 26.4 (H) 01/22/2023   REPTSTATUS 01/26/2023 FINAL 01/21/2023   CULT  01/21/2023    NO GROWTH 5 DAYS Performed at [facility], [address] [place], [place] [address]     Lab Results  Component Value Date   ALBUMIN 2.3 (L) 02/13/2023   ALBUMIN 2.3 (L) 01/27/2023   ALBUMIN 2.4 (L) 01/26/2023        Latest Ref Rng & Units 02/13/2023    3:33 AM 02/12/2023    7:55 AM 02/11/2023   12:32 PM  CBC EXTENDED  WBC 4.0 - 10.5 K/uL 10.7  9.9  10.8   RBC 4.22 - 5.81 MIL/uL 4.54  5.11  5.29   Hemoglobin 13.0 - 17.0 g/dL 72.5  36.6  44.0   HCT 39.0 - 52.0 % 35.7  40.7  42.8   Platelets 150 - 400 K/uL 353  372  424   NEUT# 1.7 - 7.7 K/uL   6.9   Lymph# 0.7 - 4.0 K/uL   2.7     Neurologic: Patient does not have protective sensation bilateral lower extremities.   MUSCULOSKELETAL:   Skin: Examination there is ulceration and drainage over the lateral right ankle.  The previous blistering and cellulitis of the right calf is resolving nicely.  White cell count 10.7 with a hemoglobin 11.2.  Albumin 2.3 C-reactive protein 7.4 with a hemoglobin A1c of 6.5.  Review of the MRI scan shows a superficial abscess lateral right ankle.  This does not communicate with bone and no signs of osteomyelitis.  Assessment: Assessment: Acute abscess superficial lateral right ankle.  Plan: Plan: Discussed with patient we will see how this resolves with IV antibiotics.  Discussed that if there is not sufficient improvement with the IV  antibiotics would plan for surgical debridement on Wednesday.  Thank you for the consult and the opportunity to see Mr. Lea  Aldean Baker, MD  [facility] [phone] 7:52 AM

## 2023-02-13 NOTE — Plan of Care (Signed)

## 2023-02-13 NOTE — Evaluation (Signed)
Physical Therapy Evaluation Patient Details Name: Randy [name] MRN: [id number] DOB: [birth year] Today's Date: 02/13/2023  History of Present Illness  Patient is 52 y.o. male who presents with new abscess lateral malleolus right ankle. Pt was previously hospitalized 2 weeks ago for cellulitis in the right calf. Foot and ankle x-ray without osteomyelitis but subacute fracture fourth bone fractures with callus formation. 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   Clinical Impression  Randy [name] is 52 y.o. male admitted with above HPI and diagnosis. Patient is currently limited by functional impairments below (see PT problem list). Patient lives with spouse and children and is independent with no AD at baseline. Currently pt requires cues for walker management and CGA fading to supervision for transfers and gait with RW. Patient will benefit from continued skilled PT interventions to address impairments and progress independence with mobility. Acute PT will follow and progress as able.         If plan is discharge home, recommend the following: A little help with walking and/or transfers;Direct supervision/assist for medications management;A little help with bathing/dressing/bathroom;Help with stairs or ramp for entrance;Assist for transportation   Can travel by private vehicle        Equipment Recommendations None recommended by PT  Recommendations for Other Services       Functional Status Assessment Patient has had a recent decline in their functional status and demonstrates the ability to make significant improvements in function in a reasonable and predictable amount of time.     Precautions / Restrictions Precautions Precautions: Fall Restrictions Weight Bearing Restrictions: No      Mobility  Bed Mobility Overal bed mobility: Needs  Assistance Bed Mobility: Supine to Sit     Supine to sit: Modified independent (Device/Increase time), Used rails, HOB elevated     General bed mobility comments: use of bed features    Transfers Overall transfer level: Needs assistance Equipment used: Rolling walker (2 wheels) Transfers: Sit to/from Stand Sit to Stand: Supervision           General transfer comment: use of bil UE to rise and control lower, no assist needed. pt able to stand from low bed height.    Ambulation/Gait Ambulation/Gait assistance: Supervision Gait Distance (Feet): 100 Feet Assistive device: Rolling walker (2 wheels) Gait Pattern/deviations: Step-through pattern, Decreased stride length, Decreased stance time - right, Decreased weight shift to right, Antalgic, Wide base of support Gait velocity: decr     General Gait Details: mild antalgia with stance on Rt LE, no overt LOB and with cues for proximity to RW pt able to maintain.  Stairs            Wheelchair Mobility     Tilt Bed    Modified Rankin (Stroke Patients Only)       Balance Overall balance assessment: Needs assistance Sitting-balance support: Feet supported Sitting balance-Leahy Scale: Good     Standing balance support: Bilateral upper extremity supported, During functional activity, Reliant on assistive device for balance Standing balance-Leahy Scale: Fair                               Pertinent Vitals/Pain Pain Assessment Pain Assessment: 0-10    Home Living Family/patient expects to be discharged to:: Private residence Living Arrangements: Spouse/significant other;Other relatives Available Help at Discharge: Family Type  of Home: House Home Access: Stairs to enter Entrance Stairs-Rails: Can reach both Entrance Stairs-Number of Steps: 2-4   Home Layout: One level Home Equipment: Agricultural consultant (2 wheels)      Prior Function Prior Level of Function : Independent/Modified Independent                      Extremity/Trunk Assessment   Upper Extremity Assessment Upper Extremity Assessment: Overall WFL for tasks assessed    Lower Extremity Assessment Lower Extremity Assessment: Overall WFL for tasks assessed    Cervical / Trunk Assessment Cervical / Trunk Assessment: Normal;Other exceptions Cervical / Trunk Exceptions: habitus  Communication   Communication Communication: No apparent difficulties  Cognition Arousal: Alert Behavior During Therapy: WFL for tasks assessed/performed Overall Cognitive Status: Within Functional Limits for tasks assessed                                          General Comments      Exercises     Assessment/Plan    PT Assessment Patient needs continued PT services  PT Problem List Decreased activity tolerance;Decreased balance;Decreased knowledge of use of DME;Decreased knowledge of precautions;Decreased safety awareness;Decreased skin integrity;Pain;Obesity       PT Treatment Interventions DME instruction;Gait training;Stair training;Functional mobility training;Therapeutic activities;Therapeutic exercise;Balance training;Neuromuscular re-education;Cognitive remediation;Patient/family education    PT Goals (Current goals can be found in the Care Plan section)  Acute Rehab PT Goals Patient Stated Goal: get better and back home PT Goal Formulation: With patient Time For Goal Achievement: 02/27/23 Potential to Achieve Goals: Good    Frequency Min 1X/week     Co-evaluation               AM-PAC PT "6 Clicks" Mobility  Outcome Measure Help needed turning from your back to your side while in a flat bed without using bedrails?: None Help needed moving from lying on your back to sitting on the side of a flat bed without using bedrails?: None Help needed moving to and from a bed to a chair (including a wheelchair)?: A Little Help needed standing up from a chair using your arms (e.g., wheelchair or  bedside chair)?: A Little Help needed to walk in hospital room?: A Little Help needed climbing 3-5 steps with a railing? : A Little 6 Click Score: 20    End of Session Equipment Utilized During Treatment: Gait belt Activity Tolerance: Patient tolerated treatment well Patient left: in chair;with call bell/phone within reach Nurse Communication: Mobility status PT Visit Diagnosis: Other abnormalities of gait and mobility (R26.89);Muscle weakness (generalized) (M62.81);Difficulty in walking, not elsewhere classified (R26.2);Pain Pain - Right/Left: Right Pain - part of body: Ankle and joints of foot    Time: 0102-7253 PT Time Calculation (min) (ACUTE ONLY): 22 min   Charges:   PT Evaluation $PT Eval Low Complexity: 1 Low   PT General Charges $$ ACUTE PT VISIT: 1 Visit         Wynn Maudlin, DPT Acute Rehabilitation Services Office [phone]  02/13/23 3:51 PM

## 2023-02-13 NOTE — Plan of Care (Signed)
  Problem: Clinical Measurements: Goal: Diagnostic test results will improve Outcome: Progressing   Problem: Activity: Goal: Risk for activity intolerance will decrease Outcome: Progressing   Problem: Nutrition: Goal: Adequate nutrition will be maintained Outcome: Progressing   Problem: Elimination: Goal: Will not experience complications related to bowel motility Outcome: Progressing Goal: Will not experience complications related to urinary retention Outcome: Progressing   Problem: Safety: Goal: Ability to remain free from injury will improve Outcome: Progressing

## 2023-02-14 DIAGNOSIS — N1832 Chronic kidney disease, stage 3b: Secondary | ICD-10-CM | POA: Diagnosis not present

## 2023-02-14 DIAGNOSIS — I4821 Permanent atrial fibrillation: Secondary | ICD-10-CM | POA: Diagnosis not present

## 2023-02-14 DIAGNOSIS — L03115 Cellulitis of right lower limb: Secondary | ICD-10-CM | POA: Diagnosis not present

## 2023-02-14 LAB — RENAL FUNCTION PANEL
Albumin: 2.5 g/dL — ABNORMAL LOW (ref 3.5–5.0)
Anion gap: 10 (ref 5–15)
BUN: 19 mg/dL (ref 6–20)
CO2: 22 mmol/L (ref 22–32)
Calcium: 9 mg/dL (ref 8.9–10.3)
Chloride: 105 mmol/L (ref 98–111)
Creatinine, Ser: 1.79 mg/dL — ABNORMAL HIGH (ref 0.61–1.24)
GFR, Estimated: 46 mL/min — ABNORMAL LOW (ref >60)
Glucose, Bld: 115 mg/dL — ABNORMAL HIGH (ref 70–99)
Phosphorus: 3.6 mg/dL (ref 2.5–4.6)
Potassium: 4 mmol/L (ref 3.5–5.1)
Sodium: 137 mmol/L (ref 135–145)

## 2023-02-14 LAB — GLUCOSE, CAPILLARY
Glucose-Capillary: 115 mg/dL — ABNORMAL HIGH (ref 70–99)
Glucose-Capillary: 130 mg/dL — ABNORMAL HIGH (ref 70–99)
Glucose-Capillary: 142 mg/dL — ABNORMAL HIGH (ref 70–99)
Glucose-Capillary: 122 mg/dL — ABNORMAL HIGH (ref 70–99)

## 2023-02-14 LAB — CBC
HCT: 38.2 % — ABNORMAL LOW (ref 39.0–52.0)
Hemoglobin: 11.9 g/dL — ABNORMAL LOW (ref 13.0–17.0)
MCH: 24.6 pg — ABNORMAL LOW (ref 26.0–34.0)
MCHC: 31.2 g/dL (ref 30.0–36.0)
MCV: 78.9 fL — ABNORMAL LOW (ref 80.0–100.0)
Platelets: 367 10*3/uL (ref 150–400)
RBC: 4.84 MIL/uL (ref 4.22–5.81)
RDW: 15.8 % — ABNORMAL HIGH (ref 11.5–15.5)
WBC: 9 10*3/uL (ref 4.0–10.5)
nRBC: 0 % (ref 0.0–0.2)

## 2023-02-14 LAB — MAGNESIUM: Magnesium: 2 mg/dL (ref 1.7–2.4)

## 2023-02-14 NOTE — Progress Notes (Signed)
Patient ID: Randy [name], male   DOB: 08/15/1970, 52 y.o.   MRN: [id number] Patient is seen in follow-up for abscess lateral malleolus right ankle.  Examination the purulent drainage has resolved.  There is healthy granulation tissue.  The swelling has also resolved.  Anticipate patient can discharge in a few days on oral antibiotics.

## 2023-02-14 NOTE — Plan of Care (Signed)

## 2023-02-14 NOTE — Progress Notes (Signed)
PROGRESS NOTE  Randy [name] ONG:[id number] DOB: 05/21/1970   PCP: Pcp, No  Patient is from: Home.  Independently ambulates at baseline.  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 Lajoyce Corners) due to right ankle cellulitis with abscess and purulent drainage.  Patient saw Dr. Lajoyce Corners for follow-up after cast removal and noted to have purulent discharge and cellulitis.  He was sent to ED for IV antibiotics and imaging to rule out osteomyelitis.  In ED, slightly hypertensive.  Not febrile.  WBC 10.8 (19.7 on 10/2).  CRP 7.4. Cr 2.19.  Foot and ankle x-ray without osteomyelitis but subacute fracture fourth bone fractures with callus formation.  Started on IV vancomycin.  MRI foot ordered.  MRI showed small abscess measuring 0.6 x 3.9 x 4.5 cm under skin ulceration over lateral ankle but no osteomyelitis.  MRI also showed mild to moderate edema within the peripheral aspect of the extensor digitorum longus muscle and portion of the flexor hallucis longus and peroneus brevis muscles.  Remains on IV antibiotics per orthopedic surgery.   Subjective: Seen and examined earlier this morning.  No major events overnight of this morning.  No complaints.  Pain well-controlled.  He was able to ambulate in the hallway without significant pain.  Objective: Vitals:   02/13/23 2000 02/14/23 0500 02/14/23 0543 02/14/23 0757  BP: (!) 134/100  118/76 113/76  Pulse: 86  95 93  Resp: 18  16 16   Temp: 98 F (36.7 C)   98.6 F (37 C)  TempSrc: Oral     SpO2: 96%  97% 97%  Weight:  (!) 162.5 kg    Height:        Examination:  GENERAL: No apparent distress.  Sitting on bedside  chair. HEENT: MMM.  Vision and hearing grossly intact.  NECK: Supple.  No apparent JVD.  RESP:  No IWOB.  Fair aeration bilaterally. CVS:  RRR. Heart sounds normal.  ABD/GI/GU: BS+. Abd soft, NTND.  MSK/EXT:  Moves extremities.  Ace wrap over RLE/right ankle.  No apparent drainage. SKIN: no apparent skin lesion or wound NEURO: Awake, alert and oriented appropriately.  No apparent focal neuro deficit. PSYCH: Calm. Normal affect.   Procedures:  None  Microbiology summarized: None  Assessment and plan: Right foot/ankle cellulitis with abscess: Patient is diabetic.  Recently treated for RLE cellulitis and discharged home.  Concerned about purulent drainage at orthopedics office.  MRI foot showed abscess but no osteomyelitis.  CRP elevated to 7.4.  ESR 54.  Leukocytosis resolved. -Continue IV vancomycin, cefepime and Flagyl  -Orthopedic surgery following -Multimodal pain control and bowel regimen -Glycemic control -Continue PT  Chronic HFimpEF: TTE on 9/20 with LVEF of 50 to 55%, severe LVH and RVSP of 34.4.  On p.o. Lasix at home.  Appears euvolemic on exam except for lower extremity edema from infection. -Continue p.o. Lasix -Monitor fluid and respiratory status  NIDDM-2 with hyperglycemia, HLD and CKD-3B: A1c 6.5%. Recent Labs  Lab 02/13/23 1212 02/13/23 1708 02/13/23 2142 02/14/23 0755 02/14/23 1122  GLUCAP 136* 126* 132* 115* 130*  -Continue SSI-moderate -Hold home meds -Continues to statin  CKD-3B: Creatinine improved. Recent Labs    01/23/23 0801 01/24/23  1610 01/25/23 9604 01/26/23 0458 01/27/23 0434 01/28/23 0527 02/11/23 1232 02/12/23 0755 02/13/23 0333 02/14/23 0923  BUN 22* 20 16 15 15 16  29* 23* 23* 19  CREATININE 2.51* 2.14* 1.87* 1.85* 2.01* 1.91* 2.19* 1.89* 1.74* 1.79*  -Continue monitoring  Permanent atrial fibrillation: Rate controlled -Continue Cardizem, Toprol and Eliquis   Essential hypertension: Improved. -Continue home BiDil, Toprol-XL  and Cardizem    Morbid obesity Body mass index is 39.36 kg/m.           DVT prophylaxis:   apixaban (ELIQUIS) tablet 5 mg  Code Status: Full code Family Communication: None at bedside Level of care: Med-Surg Status is: Inpatient Remains inpatient appropriate because: Right foot and ankle cellulitis with abscess   Final disposition: Likely home Consultants:  Orthopedic surgery  35 minutes with more than 50% spent in reviewing records, counseling patient/family and coordinating care.   Sch Meds:  Scheduled Meds:  apixaban  5 mg Oral BID   atorvastatin  20 mg Oral QHS   diltiazem  360 mg Oral Daily   furosemide  40 mg Oral Daily   insulin aspart  0-15 Units Subcutaneous TID WC   insulin aspart  0-5 Units Subcutaneous QHS   isosorbide-hydrALAZINE  2 tablet Oral TID   metoprolol succinate  100 mg Oral QHS   senna-docusate  1 tablet Oral Daily   sodium chloride flush  3 mL Intravenous Q12H   Continuous Infusions:  ceFEPime (MAXIPIME) IV 2 g (02/14/23 0923)   metronidazole 500 mg (02/14/23 1006)   vancomycin 1,250 mg (02/14/23 0246)   PRN Meds:.acetaminophen **OR** acetaminophen, gadobutrol, HYDROmorphone (DILAUDID) injection, ondansetron **OR** ondansetron (ZOFRAN) IV, oxyCODONE  Antimicrobials: Anti-infectives (From admission, onward)    Start     Dose/Rate Route Frequency Ordered Stop   02/14/23 0300  vancomycin (VANCOREADY) IVPB 1250 mg/250 mL        1,250 mg 166.7 mL/hr over 90 Minutes Intravenous Every 12 hours 02/13/23 1326     02/13/23 1415  vancomycin (VANCOREADY) IVPB 1250 mg/250 mL        1,250 mg 166.7 mL/hr over 90 Minutes Intravenous  Once 02/13/23 1326 02/13/23 1548   02/12/23 1800  vancomycin (VANCOREADY) IVPB 1750 mg/350 mL  Status:  Discontinued        1,750 mg 175 mL/hr over 120 Minutes Intravenous Every 24 hours 02/12/23 0907 02/13/23 1326   02/12/23 1000  ceFEPIme (MAXIPIME) 2 g in sodium chloride 0.9 % 100 mL IVPB        2 g 200 mL/hr over  30 Minutes Intravenous Every 8 hours 02/12/23 0900     02/12/23 0900  metroNIDAZOLE (FLAGYL) IVPB 500 mg        500 mg 100 mL/hr over 60 Minutes Intravenous Every 12 hours 02/12/23 0809     02/11/23 1712  vancomycin (VANCOREADY) IVPB 2000 mg/400 mL  Status:  Discontinued        2,000 mg 200 mL/hr over 120 Minutes Intravenous Every 24 hours 02/11/23 1712 02/12/23 0907   02/11/23 1545  ceFEPIme (MAXIPIME) 2 g in sodium chloride 0.9 % 100 mL IVPB  Status:  Discontinued        2 g 200 mL/hr over 30 Minutes Intravenous Every 8 hours 02/11/23 1532 02/11/23 1707   02/11/23 1530  vancomycin (VANCOREADY) IVPB 2000 mg/400 mL  Status:  Discontinued        2,000 mg 200 mL/hr over 120 Minutes Intravenous  Once 02/11/23 1526 02/11/23 1712  I have personally reviewed the following labs and images: CBC: Recent Labs  Lab 02/11/23 1232 02/12/23 0755 02/13/23 0333 02/14/23 0923  WBC 10.8* 9.9 10.7* 9.0  NEUTROABS 6.9  --   --   --   HGB 13.7 12.9* 11.2* 11.9*  HCT 42.8 40.7 35.7* 38.2*  MCV 80.9 79.6* 78.6* 78.9*  PLT 424* 372 353 367   BMP &GFR Recent Labs  Lab 02/11/23 1232 02/12/23 0755 02/13/23 0333 02/14/23 0923  NA 137 134* 137 137  K 4.2 3.5 3.5 4.0  CL 105 102 102 105  CO2 26 22 19* 22  GLUCOSE 155* 104* 118* 115*  BUN 29* 23* 23* 19  CREATININE 2.19* 1.89* 1.74* 1.79*  CALCIUM 9.0 8.7* 9.6 9.0  MG  --   --  1.9 2.0  PHOS  --   --  3.9 3.6   Estimated Creatinine Clearance: 85.6 mL/min (A) (by C-G formula based on SCr of 1.79 mg/dL (H)). Liver & Pancreas: Recent Labs  Lab 02/13/23 0333 02/14/23 0923  ALBUMIN 2.3* 2.5*   No results for input(s): "LIPASE", "AMYLASE" in the last 168 hours. No results for input(s): "AMMONIA" in the last 168 hours. Diabetic: Recent Labs    02/11/23 1232  HGBA1C 6.5*   Recent Labs  Lab 02/13/23 1212 02/13/23 1708 02/13/23 2142 02/14/23 0755 02/14/23 1122  GLUCAP 136* 126* 132* 115* 130*   Cardiac Enzymes: No results  for input(s): "CKTOTAL", "CKMB", "CKMBINDEX", "TROPONINI" in the last 168 hours. No results for input(s): "PROBNP" in the last 8760 hours. Coagulation Profile: No results for input(s): "INR", "PROTIME" in the last 168 hours. Thyroid Function Tests: No results for input(s): "TSH", "T4TOTAL", "FREET4", "T3FREE", "THYROIDAB" in the last 72 hours. Lipid Profile: No results for input(s): "CHOL", "HDL", "LDLCALC", "TRIG", "CHOLHDL", "LDLDIRECT" in the last 72 hours. Anemia Panel: No results for input(s): "VITAMINB12", "FOLATE", "FERRITIN", "TIBC", "IRON", "RETICCTPCT" in the last 72 hours. Urine analysis:    Component Value Date/Time   COLORURINE AMBER (A) 01/21/2023 0137   APPEARANCEUR HAZY (A) 01/21/2023 0137   LABSPEC 1.028 01/21/2023 0137   PHURINE 5.0 01/21/2023 0137   GLUCOSEU >=500 (A) 01/21/2023 0137   HGBUR SMALL (A) 01/21/2023 0137   HGBUR trace-intact 03/19/2008 1441   BILIRUBINUR NEGATIVE 01/21/2023 0137   BILIRUBINUR Negative 05/29/2011 1609   KETONESUR 5 (A) 01/21/2023 0137   PROTEINUR >=300 (A) 01/21/2023 0137   UROBILINOGEN 0.2 05/29/2011 1609   UROBILINOGEN 1.0 03/12/2010 1140   NITRITE NEGATIVE 01/21/2023 0137   LEUKOCYTESUR NEGATIVE 01/21/2023 0137   Sepsis Labs: Invalid input(s): "PROCALCITONIN", "LACTICIDVEN"  Microbiology: No results found for this or any previous visit (from the past 240 hour(s)).  Radiology Studies: No results found.    Kaydense Rizo T. Ula Couvillon [facility]  If 7PM-7AM, please contact night-coverage [email or web address] 02/14/2023, 12:02 PM home.  Independently ambulates at baseline.

## 2023-02-15 DIAGNOSIS — L03115 Cellulitis of right lower limb: Secondary | ICD-10-CM | POA: Diagnosis not present

## 2023-02-15 LAB — RENAL FUNCTION PANEL
Albumin: 2.5 g/dL — ABNORMAL LOW (ref 3.5–5.0)
Anion gap: 8 (ref 5–15)
BUN: 19 mg/dL (ref 6–20)
CO2: 21 mmol/L — ABNORMAL LOW (ref 22–32)
Calcium: 8.8 mg/dL — ABNORMAL LOW (ref 8.9–10.3)
Chloride: 107 mmol/L (ref 98–111)
Creatinine, Ser: 1.73 mg/dL — ABNORMAL HIGH (ref 0.61–1.24)
GFR, Estimated: 47 mL/min — ABNORMAL LOW (ref >60)
Glucose, Bld: 100 mg/dL — ABNORMAL HIGH (ref 70–99)
Phosphorus: 3.5 mg/dL (ref 2.5–4.6)
Potassium: 3.7 mmol/L (ref 3.5–5.1)
Sodium: 136 mmol/L (ref 135–145)

## 2023-02-15 LAB — CBC
HCT: 38.7 % — ABNORMAL LOW (ref 39.0–52.0)
Hemoglobin: 12.4 g/dL — ABNORMAL LOW (ref 13.0–17.0)
MCH: 25.5 pg — ABNORMAL LOW (ref 26.0–34.0)
MCHC: 32 g/dL (ref 30.0–36.0)
MCV: 79.5 fL — ABNORMAL LOW (ref 80.0–100.0)
Platelets: 384 10*3/uL (ref 150–400)
RBC: 4.87 MIL/uL (ref 4.22–5.81)
RDW: 15.8 % — ABNORMAL HIGH (ref 11.5–15.5)
WBC: 8.8 10*3/uL (ref 4.0–10.5)
nRBC: 0 % (ref 0.0–0.2)

## 2023-02-15 LAB — GLUCOSE, CAPILLARY
Glucose-Capillary: 97 mg/dL (ref 70–99)
Glucose-Capillary: 140 mg/dL — ABNORMAL HIGH (ref 70–99)
Glucose-Capillary: 172 mg/dL — ABNORMAL HIGH (ref 70–99)
Glucose-Capillary: 116 mg/dL — ABNORMAL HIGH (ref 70–99)

## 2023-02-15 LAB — MAGNESIUM: Magnesium: 1.9 mg/dL (ref 1.7–2.4)

## 2023-02-15 MED ORDER — OXYCODONE HCL 5 MG PO TABS
5.0000 mg | ORAL_TABLET | ORAL | Status: DC | PRN
  Administered 2023-02-15 – 2023-02-16 (×6): 10 mg via ORAL
  Filled 2023-02-15 (×5): qty 2

## 2023-02-15 NOTE — Progress Notes (Signed)
PROGRESS NOTE  Randy [name] TDD:[id number] DOB: [birth year]   PCP: Pcp, No  Patient is from: Home.  Independently ambulates at baseline.  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 Lajoyce Corners) due to right ankle cellulitis with abscess and purulent drainage.  Patient saw Dr. Lajoyce Corners for follow-up after cast removal and noted to have purulent discharge and cellulitis.  He was sent to ED for IV antibiotics and imaging to rule out osteomyelitis.  In ED, slightly hypertensive.  Not febrile.  WBC 10.8 (19.7 on 10/2).  CRP 7.4. Cr 2.19.  Foot and ankle x-ray without osteomyelitis but subacute fracture fourth bone fractures with callus formation.  Started on IV vancomycin.  MRI foot ordered.  MRI showed small abscess measuring 0.6 x 3.9 x 4.5 cm under skin ulceration over lateral ankle but no osteomyelitis.  MRI also showed mild to moderate edema within the peripheral aspect of the extensor digitorum longus muscle and portion of the flexor hallucis longus and peroneus brevis muscles.  Remains on IV antibiotics per orthopedic surgery.   Subjective: Seen and examined earlier this morning.  No major events overnight of this morning.  No complaints.  Reports improving pain right ankle. Tolerating diet.  Objective: Vitals:   02/15/23 0432 02/15/23 0432 02/15/23 0500 02/15/23 0749  BP:  123/82  (!) 146/99  Pulse:  88  89  Resp:    18  Temp: (!) 97.5 F (36.4 C)   98.2 F (36.8 C)  TempSrc: Oral   Oral  SpO2:  96%  99%  Weight:   (!) 163.2 kg   Height:        Examination:  GENERAL: No apparent distress.  Sitting in bed HEENT: MMM.  atraumatic  RESP:  CTAB CVS:   RRR. No murmur  ABD/GI/GU: BS+. Abd soft, NTND.  MSK/EXT:  Moves extremities.  Ace wrap over RLE/right ankle.  No visible swelling SKIN: no apparent skin lesion or wound NEURO: Awake, alert and oriented appropriately.  No apparent focal neuro deficit. PSYCH: Calm. Normal affect.   Procedures:  None  Microbiology summarized: None  Assessment and plan: Right foot/ankle cellulitis with abscess: Patient is diabetic.  Recently treated for RLE cellulitis and discharged home.  Concerned about purulent drainage at orthopedics office.  MRI foot showed abscess but no osteomyelitis.  CRP elevated to 7.4.  ESR 54.  Leukocytosis resolved. No cultures obtained -Continue IV vancomycin, cefepime and Flagyl  -Orthopedic surgery following and managing, yesterday advised continuing abx for a few more days - PT has evaluated, no home needs identified  Chronic HFimpEF: TTE on 9/20 with LVEF of 50 to 55%, severe LVH and RVSP of 34.4.  On p.o. Lasix at home.  Appears euvolemic on exam except for lower extremity edema from infection. -Continue p.o. Lasix -Monitor fluid and respiratory status  NIDDM-2 with hyperglycemia, HLD and CKD-3B: A1c 6.5%. Recent Labs  Lab 02/14/23 0755 02/14/23 1122 02/14/23 1640 02/14/23 2140 02/15/23 0728  GLUCAP 115* 130* 142* 122* 97  -Continue SSI-moderate -Hold home meds -Continues to statin  CKD-3B: kidney function appears to be at baseline Recent Labs    01/23/23 0801 01/24/23 0543 01/25/23 6237 01/26/23 0458 01/27/23 0434  01/28/23 0527 02/11/23 1232 02/12/23 0755 02/13/23 0333 02/14/23 0923  BUN 22* 20 16 15 15 16  29* 23* 23* 19  CREATININE 2.51* 2.14* 1.87* 1.85* 2.01* 1.91* 2.19* 1.89* 1.74* 1.79*  -Continue monitoring  Permanent atrial fibrillation: Rate controlled -Continue Cardizem, Toprol and Eliquis   Essential hypertension: mild elevation today -Continue home BiDil, Toprol-XL and Cardizem    Morbid obesity Body mass index is 39.53  kg/m.           DVT prophylaxis:   apixaban (ELIQUIS) tablet 5 mg  Code Status: Full code Family Communication: None at bedside Level of care: Med-Surg Status is: Inpatient Remains inpatient appropriate because: ongoing need for IV abx   Final disposition: home Consultants:  Orthopedic surgery    Sch Meds:  Scheduled Meds:  apixaban  5 mg Oral BID   atorvastatin  20 mg Oral QHS   diltiazem  360 mg Oral Daily   furosemide  40 mg Oral Daily   insulin aspart  0-15 Units Subcutaneous TID WC   insulin aspart  0-5 Units Subcutaneous QHS   isosorbide-hydrALAZINE  2 tablet Oral TID   metoprolol succinate  100 mg Oral QHS   senna-docusate  1 tablet Oral Daily   sodium chloride flush  3 mL Intravenous Q12H   Continuous Infusions:  ceFEPime (MAXIPIME) IV 2 g (02/15/23 0102)   metronidazole 500 mg (02/14/23 2126)   vancomycin 1,250 mg (02/15/23 0326)   PRN Meds:.acetaminophen **OR** acetaminophen, gadobutrol, HYDROmorphone (DILAUDID) injection, ondansetron **OR** ondansetron (ZOFRAN) IV, oxyCODONE  Antimicrobials: Anti-infectives (From admission, onward)    Start     Dose/Rate Route Frequency Ordered Stop   02/14/23 0300  vancomycin (VANCOREADY) IVPB 1250 mg/250 mL        1,250 mg 166.7 mL/hr over 90 Minutes Intravenous Every 12 hours 02/13/23 1326     02/13/23 1415  vancomycin (VANCOREADY) IVPB 1250 mg/250 mL        1,250 mg 166.7 mL/hr over 90 Minutes Intravenous  Once 02/13/23 1326 02/13/23 1548   02/12/23 1800  vancomycin (VANCOREADY) IVPB 1750 mg/350 mL  Status:  Discontinued        1,750 mg 175 mL/hr over 120 Minutes Intravenous Every 24 hours 02/12/23 0907 02/13/23 1326   02/12/23 1000  ceFEPIme (MAXIPIME) 2 g in sodium chloride 0.9 % 100 mL IVPB        2 g 200 mL/hr over 30 Minutes Intravenous Every 8 hours 02/12/23 0900     02/12/23 0900  metroNIDAZOLE (FLAGYL) IVPB 500 mg        500 mg 100 mL/hr over 60 Minutes Intravenous Every 12 hours 02/12/23 0809      02/11/23 1712  vancomycin (VANCOREADY) IVPB 2000 mg/400 mL  Status:  Discontinued        2,000 mg 200 mL/hr over 120 Minutes Intravenous Every 24 hours 02/11/23 1712 02/12/23 0907   02/11/23 1545  ceFEPIme (MAXIPIME) 2 g in sodium chloride 0.9 % 100 mL IVPB  Status:  Discontinued        2 g 200 mL/hr over 30 Minutes Intravenous Every 8 hours 02/11/23 1532 02/11/23 1707   02/11/23 1530  vancomycin (VANCOREADY) IVPB 2000 mg/400 mL  Status:  Discontinued        2,000 mg 200 mL/hr over 120 Minutes Intravenous  Once 02/11/23 1526 02/11/23 1712        I have personally reviewed the following labs and images: CBC: Recent Labs  Lab 02/11/23 1232 02/12/23 0755 02/13/23 0333 02/14/23 1610  WBC 10.8* 9.9 10.7* 9.0  NEUTROABS 6.9  --   --   --   HGB 13.7 12.9* 11.2* 11.9*  HCT 42.8 40.7 35.7* 38.2*  MCV 80.9 79.6* 78.6* 78.9*  PLT 424* 372 353 367   BMP &GFR Recent Labs  Lab 02/11/23 1232 02/12/23 0755 02/13/23 0333 02/14/23 0923  NA 137 134* 137 137  K 4.2 3.5 3.5 4.0  CL 105 102 102 105  CO2 26 22 19* 22  GLUCOSE 155* 104* 118* 115*  BUN 29* 23* 23* 19  CREATININE 2.19* 1.89* 1.74* 1.79*  CALCIUM 9.0 8.7* 9.6 9.0  MG  --   --  1.9 2.0  PHOS  --   --  3.9 3.6   Estimated Creatinine Clearance: 85.8 mL/min (A) (by C-G formula based on SCr of 1.79 mg/dL (H)). Liver & Pancreas: Recent Labs  Lab 02/13/23 0333 02/14/23 0923  ALBUMIN 2.3* 2.5*   No results for input(s): "LIPASE", "AMYLASE" in the last 168 hours. No results for input(s): "AMMONIA" in the last 168 hours. Diabetic: No results for input(s): "HGBA1C" in the last 72 hours.  Recent Labs  Lab 02/14/23 0755 02/14/23 1122 02/14/23 1640 02/14/23 2140 02/15/23 0728  GLUCAP 115* 130* 142* 122* 97   Cardiac Enzymes: No results for input(s): "CKTOTAL", "CKMB", "CKMBINDEX", "TROPONINI" in the last 168 hours. No results for input(s): "PROBNP" in the last 8760 hours. Coagulation Profile: No results for input(s):  "INR", "PROTIME" in the last 168 hours. Thyroid Function Tests: No results for input(s): "TSH", "T4TOTAL", "FREET4", "T3FREE", "THYROIDAB" in the last 72 hours. Lipid Profile: No results for input(s): "CHOL", "HDL", "LDLCALC", "TRIG", "CHOLHDL", "LDLDIRECT" in the last 72 hours. Anemia Panel: No results for input(s): "VITAMINB12", "FOLATE", "FERRITIN", "TIBC", "IRON", "RETICCTPCT" in the last 72 hours. Urine analysis:    Component Value Date/Time   COLORURINE AMBER (A) 01/21/2023 0137   APPEARANCEUR HAZY (A) 01/21/2023 0137   LABSPEC 1.028 01/21/2023 0137   PHURINE 5.0 01/21/2023 0137   GLUCOSEU >=500 (A) 01/21/2023 0137   HGBUR SMALL (A) 01/21/2023 0137   HGBUR trace-intact 03/19/2008 1441   BILIRUBINUR NEGATIVE 01/21/2023 0137   BILIRUBINUR Negative 05/29/2011 1609   KETONESUR 5 (A) 01/21/2023 0137   PROTEINUR >=300 (A) 01/21/2023 0137   UROBILINOGEN 0.2 05/29/2011 1609   UROBILINOGEN 1.0 03/12/2010 1140   NITRITE NEGATIVE 01/21/2023 0137   LEUKOCYTESUR NEGATIVE 01/21/2023 0137   Sepsis Labs: Invalid input(s): "PROCALCITONIN", "LACTICIDVEN"  Microbiology: No results found for this or any previous visit (from the past 240 hour(s)).  Radiology Studies: No results found.   Shonna Chock, MD [facility]  If 7PM-7AM, please contact night-coverage [email or web address]

## 2023-02-15 NOTE — Plan of Care (Signed)
  Problem: Education: Goal: Ability to describe self-care measures that may prevent or decrease complications (Diabetes Survival Skills Education) will improve Outcome: Progressing Goal: Individualized Educational Video(s) Outcome: Progressing   

## 2023-02-15 NOTE — Plan of Care (Signed)

## 2023-02-15 NOTE — Progress Notes (Signed)
Mobility Specialist: Progress Note   02/15/23 1716  Mobility  Activity Ambulated independently in hallway  Level of Assistance Modified independent, requires aide device or extra time  Assistive Device Front wheel walker  Distance Ambulated (ft) 350 ft  Activity Response Tolerated well  Mobility Referral Yes  $Mobility charge 1 Mobility  Mobility Specialist Start Time (ACUTE ONLY) 1512  Mobility Specialist Stop Time (ACUTE ONLY) 1529  Mobility Specialist Time Calculation (min) (ACUTE ONLY) 17 min    Pt was agreeable to mobility session - received in bed. ModI thoroughout. Had c/o RLE pain rated 6/10 but tolerable. Returned to room without fault. Left in bed with all needs met, call bell in reach.    Maurene Capes Mobility Specialist Please contact via SecureChat or Rehab office at [phone]

## 2023-02-16 ENCOUNTER — Telehealth: Payer: Self-pay | Admitting: Family

## 2023-02-16 DIAGNOSIS — L03115 Cellulitis of right lower limb: Secondary | ICD-10-CM | POA: Diagnosis not present

## 2023-02-16 LAB — GLUCOSE, CAPILLARY
Glucose-Capillary: 91 mg/dL (ref 70–99)
Glucose-Capillary: 143 mg/dL — ABNORMAL HIGH (ref 70–99)
Glucose-Capillary: 124 mg/dL — ABNORMAL HIGH (ref 70–99)
Glucose-Capillary: 148 mg/dL — ABNORMAL HIGH (ref 70–99)

## 2023-02-16 NOTE — Progress Notes (Signed)
PROGRESS NOTE    Randy [name]  ZOX:[id number] DOB: 12-01-1970 DOA: 02/11/2023 PCP: Oneita Hurt, No    Brief Narrative:   Randy [name] is a 52 y.o. male with past medical history significant for DM2, history of CVA, permanent atrial fibrillation on Eliquis, HFimpEF, CKD3b, HTN, history of osteomyelitis/abscess s/p right great toe amputation, morbid obesity who presented to [facility] ED by direction of his orthopedic surgeon Lajoyce Corners) on 10/17 for persistent right ankle cellulitis with concern for abscess and purulent drainage.  Recently hospitalized from 9/26 - 10/4 for right lower extremity cellulitis.  Was seen in follow-up after cast removal and noted to have purulent discharge and cellulitis and was sent to the ED for IV antibiotics and imaging to rule out osteomyelitis.  In the ED, temperature 98.6 F, HR 64, RR 18, BP 152/117, SpO2 100% on room air. WBC 10.8 (19.7 on 10/2). CRP 7.4. Cr 2.19. Foot and ankle x-ray without osteomyelitis but subacute fracture fourth bone fractures with callus formation. MRI showed small abscess measuring 0.6 x 3.9 x 4.5 cm under skin ulceration over lateral ankle but no osteomyelitis. MRI also showed mild to moderate edema within the peripheral aspect of the extensor digitorum longus muscle and portion of the flexor hallucis longus and peroneus brevis muscles.  Patient was started on IV antibiotics.  TRH consulted for admission for further evaluation and management.  Assessment & Plan:   Right foot/ankle cellulitis with abscess Patient presenting from orthopedic office for concerns of continued cellulitis with abscess formation of his right lower extremity.  Recently admitted for cellulitis and discharged on oral antibiotics.  On follow-up with orthopedics concern for purulent discharge and was sent to the ED for further evaluation and management.  Patient was afebrile, with slightly elevated WC count of 10.8 (19.7 on 10/2).  MRI RLE w/ findings small abscess measuring 0.6 x  3.9 x 4.5 cm under skin ulceration over lateral ankle but no osteomyelitis. MRI also showed mild to moderate edema within the peripheral aspect of the extensor digitorum longus muscle and portion of the flexor hallucis longus and peroneus brevis muscles. -- Orthopedics following, appreciate assistance -- WBC 10.8>>8.8 -- Vancomycin, pharmacy consulted for dosing/monitoring -- Cefepime 2g IV q8h -- Metronidazole 500 mg IV every 12 hours  Type 2 diabetes mellitus Hemoglobin A1c 6.5, well-controlled -- Moderate SSI for coverage -- CBGs qAC/HS  CKD stage IIIb -- Cr 1.73, stable  HLD -- Atorvastatin 20 mg p.o. daily  Permanent atrial fibrillation -- Diltiazem CD 360 mg p.o. daily -- Metoprolol succinate 100 mg p.o. daily -- Eliquis 5 mg p.o. twice daily  Essential hypertension Hx HFimpEF -- Diltiazem CD 360 mg p.o. daily --Metoprolol succinate 100 mg p.o. daily -- BiDil 20-37.5 mg p.o. 3 times daily  Morbid obesity Body mass index is 39.57 kg/m.  Complicates all facets of care   DVT prophylaxis:  apixaban (ELIQUIS) tablet 5 mg    Code Status: Full Code Family Communication: No family present at bedside this morning  Disposition Plan:  Level of care: Med-Surg Status is: Inpatient Remains inpatient appropriate because: IV antibiotics, anticipate discharge home on oral antibiotics tomorrow    Consultants:  Orthopedics, Dr. Lajoyce Corners  Procedures:  None  Antimicrobials:  Vancomycin 10/17>> Cefepime 10/17>> Metronidazole 10/18>>   Subjective: Patient seen examined bedside, resting calmly.  Sitting at edge of bed.  Seen by orthopedics this morning.  Pain to right lower extremity much improved.  No other specific questions or concerns at this time.  Denies headache, no visual  changes, no chest pain, no palpitations, no shortness of breath, no abdominal pain, no fever/chills/night sweats, no nausea/vomiting/diarrhea, no focal weakness, no fatigue, no paresthesias.  No acute  events overnight per nursing staff.  Objective: Vitals:   02/16/23 0451 02/16/23 0500 02/16/23 0730 02/16/23 1413  BP: 136/88  (!) 143/106 (!) 134/105  Pulse: 79  88 82  Resp:   19 16  Temp: 98.6 F (37 C)  98 F (36.7 C) 98.4 F (36.9 C)  TempSrc: Oral  Oral Oral  SpO2: 97%  97% 98%  Weight:  (!) 163.4 kg    Height:        Intake/Output Summary (Last 24 hours) at 02/16/2023 1609 Last data filed at 02/16/2023 1427 Gross per 24 hour  Intake 3 ml  Output 2780 ml  Net -2777 ml   Filed Weights   02/14/23 0500 02/15/23 0500 02/16/23 0500  Weight: (!) 162.5 kg (!) 163.2 kg (!) 163.4 kg    Examination:  Physical Exam: GEN: NAD, alert and oriented x 3, obese HEENT: NCAT, PERRL, EOMI, sclera clear, MMM PULM: CTAB w/o wheezes/crackles, normal respiratory effort, on room air CV: RRR w/o M/G/R GI: abd soft, NTND, NABS, no R/G/M MSK: + peripheral edema, right lower extremity with bandage/dressing in place, clean/dry/intact muscle strength globally intact 5/5 bilateral upper/lower extremities NEURO: CN II-XII intact, no focal deficits, sensation to light touch intact PSYCH: normal mood/affect Integumentary: Right lower extremity as above, otherwise no other concerning rashes/lesions/wounds noted on exposed skin surfaces     Data Reviewed: I have personally reviewed following labs and imaging studies  CBC: Recent Labs  Lab 02/11/23 1232 02/12/23 0755 02/13/23 0333 02/14/23 0923 02/15/23 0834  WBC 10.8* 9.9 10.7* 9.0 8.8  NEUTROABS 6.9  --   --   --   --   HGB 13.7 12.9* 11.2* 11.9* 12.4*  HCT 42.8 40.7 35.7* 38.2* 38.7*  MCV 80.9 79.6* 78.6* 78.9* 79.5*  PLT 424* 372 353 367 384   Basic Metabolic Panel: Recent Labs  Lab 02/11/23 1232 02/12/23 0755 02/13/23 0333 02/14/23 0923 02/15/23 0834  NA 137 134* 137 137 136  K 4.2 3.5 3.5 4.0 3.7  CL 105 102 102 105 107  CO2 26 22 19* 22 21*  GLUCOSE 155* 104* 118* 115* 100*  BUN 29* 23* 23* 19 19  CREATININE 2.19*  1.89* 1.74* 1.79* 1.73*  CALCIUM 9.0 8.7* 9.6 9.0 8.8*  MG  --   --  1.9 2.0 1.9  PHOS  --   --  3.9 3.6 3.5   GFR: Estimated Creatinine Clearance: 88.9 mL/min (A) (by C-G formula based on SCr of 1.73 mg/dL (H)). Liver Function Tests: Recent Labs  Lab 02/13/23 0333 02/14/23 0923 02/15/23 0834  ALBUMIN 2.3* 2.5* 2.5*   No results for input(s): "LIPASE", "AMYLASE" in the last 168 hours. No results for input(s): "AMMONIA" in the last 168 hours. Coagulation Profile: No results for input(s): "INR", "PROTIME" in the last 168 hours. Cardiac Enzymes: No results for input(s): "CKTOTAL", "CKMB", "CKMBINDEX", "TROPONINI" in the last 168 hours. BNP (last 3 results) No results for input(s): "PROBNP" in the last 8760 hours. HbA1C: No results for input(s): "HGBA1C" in the last 72 hours. CBG: Recent Labs  Lab 02/15/23 1121 02/15/23 1613 02/15/23 2133 02/16/23 0729 02/16/23 1105  GLUCAP 140* 172* 116* 91 143*   Lipid Profile: No results for input(s): "CHOL", "HDL", "LDLCALC", "TRIG", "CHOLHDL", "LDLDIRECT" in the last 72 hours. Thyroid Function Tests: No results for input(s): "TSH", "T4TOTAL", "  FREET4", "T3FREE", "THYROIDAB" in the last 72 hours. Anemia Panel: No results for input(s): "VITAMINB12", "FOLATE", "FERRITIN", "TIBC", "IRON", "RETICCTPCT" in the last 72 hours. Sepsis Labs: No results for input(s): "PROCALCITON", "LATICACIDVEN" in the last 168 hours.  No results found for this or any previous visit (from the past 240 hour(s)).       Radiology Studies: No results found.      Scheduled Meds:  apixaban  5 mg Oral BID   atorvastatin  20 mg Oral QHS   diltiazem  360 mg Oral Daily   furosemide  40 mg Oral Daily   insulin aspart  0-15 Units Subcutaneous TID WC   insulin aspart  0-5 Units Subcutaneous QHS   isosorbide-hydrALAZINE  2 tablet Oral TID   metoprolol succinate  100 mg Oral QHS   senna-docusate  1 tablet Oral Daily   sodium chloride flush  3 mL Intravenous  Q12H   Continuous Infusions:  ceFEPime (MAXIPIME) IV 2 g (02/16/23 1045)   metronidazole 500 mg (02/16/23 0857)   vancomycin 1,250 mg (02/16/23 1427)     LOS: 5 days    Time spent: 52 minutes spent on chart review, discussion with nursing staff, consultants, updating family and interview/physical exam; more than 50% of that time was spent in counseling and/or coordination of care.    Alvira Philips [place], DO [facility] Available via Epic secure chat 7am-7pm After these hours, please refer to coverage provider listed on [email or web address] 02/16/2023, 4:09 PM

## 2023-02-16 NOTE — Plan of Care (Signed)
  Problem: Education: Goal: Ability to describe self-care measures that may prevent or decrease complications (Diabetes Survival Skills Education) will improve Outcome: Progressing   

## 2023-02-16 NOTE — Progress Notes (Signed)
Patient requested wound care be done by Dr. Lajoyce Corners or next shift to provide.

## 2023-02-16 NOTE — Progress Notes (Signed)
Patient ID: Randy [name], male   DOB: 02/25/1971, 52 y.o.   MRN: [id number] Patient has shown excellent improvement in the infection lateral right ankle.  There is now healthy granulation tissue the wound bed is flat no fluctuation from the initial infection.  Feel patient could discharge on oral antibiotics.  Recommend that he proceed with dry dressing changes daily with Dial soap cleansing and I will follow-up in the office in 1 week.

## 2023-02-16 NOTE — Progress Notes (Signed)
Physical Therapy Treatment Patient Details Name: Randy [name] MRN: [id number] DOB: [birth year] Today's Date: 02/16/2023   History of Present Illness Patient is 52 y.o. male who presents with new abscess lateral malleolus right ankle. Pt was previously hospitalized 2 weeks ago for cellulitis in the right calf. Foot and ankle x-ray without osteomyelitis but subacute fracture fourth bone fractures with callus formation. 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    PT Comments  Pt tolerated treatment well today. Pt able to progress ambulation in hallway today with RW Mod I. Deferred stair training as pt states that he has no steps to enter home and plans to stay on first level when Dc'd. No change in DC recs however pt would like a RW for home. PT will continue to follow.     If plan is discharge home, recommend the following: A little help with walking and/or transfers;Direct supervision/assist for medications management;A little help with bathing/dressing/bathroom;Help with stairs or ramp for entrance;Assist for transportation   Can travel by private vehicle        Equipment Recommendations  None recommended by PT    Recommendations for Other Services       Precautions / Restrictions Precautions Precautions: Fall Restrictions Weight Bearing Restrictions: No     Mobility  Bed Mobility Overal bed mobility: Modified Independent Bed Mobility: Supine to Sit     Supine to sit: Modified independent (Device/Increase time), Used rails, HOB elevated          Transfers Overall transfer level: Needs assistance Equipment used: None Transfers: Sit to/from Stand Sit to Stand: Independent           General transfer comment: use of bil UE to rise and control lower, no assist needed. pt able to stand from low bed height.     Ambulation/Gait Ambulation/Gait assistance: Modified independent (Device/Increase time) Gait Distance (Feet): 350 Feet Assistive device: Rolling walker (2 wheels) Gait Pattern/deviations: Step-through pattern, Decreased stride length, Decreased stance time - right, Decreased weight shift to right, Antalgic, Wide base of support Gait velocity: decr     General Gait Details: mild antalgia with stance on Rt LE, no overt LOB   Stairs Stairs:  (pt states that he plans to stay on first level of house)           Wheelchair Mobility     Tilt Bed    Modified Rankin (Stroke Patients Only)       Balance Overall balance assessment: Needs assistance Sitting-balance support: Feet supported Sitting balance-Leahy Scale: Good     Standing balance support: Bilateral upper extremity supported, During functional activity, Reliant on assistive device for balance Standing balance-Leahy Scale: Fair                              Cognition Arousal: Alert Behavior During Therapy: WFL for tasks assessed/performed Overall Cognitive Status: Within Functional Limits for tasks assessed                                          Exercises      General Comments General comments (skin integrity, edema, etc.): VSS      Pertinent Vitals/Pain Pain Assessment Pain Assessment: Faces Faces Pain Scale: Hurts little more  Pain Location: R foot Pain Descriptors / Indicators: Discomfort, Grimacing Pain Intervention(s): Monitored during session    Home Living                          Prior Function            PT Goals (current goals can now be found in the care plan section) Progress towards PT goals: Progressing toward goals    Frequency    Min 1X/week      PT Plan      Co-evaluation              AM-PAC PT "6 Clicks" Mobility   Outcome Measure  Help needed turning from your back to your side while in a flat bed without using  bedrails?: None Help needed moving from lying on your back to sitting on the side of a flat bed without using bedrails?: None Help needed moving to and from a bed to a chair (including a wheelchair)?: A Little Help needed standing up from a chair using your arms (e.g., wheelchair or bedside chair)?: A Little Help needed to walk in hospital room?: A Little Help needed climbing 3-5 steps with a railing? : A Little 6 Click Score: 20    End of Session Equipment Utilized During Treatment: Gait belt Activity Tolerance: Patient tolerated treatment well Patient left: in bed;with call bell/phone within reach Nurse Communication: Mobility status PT Visit Diagnosis: Other abnormalities of gait and mobility (R26.89);Muscle weakness (generalized) (M62.81);Difficulty in walking, not elsewhere classified (R26.2);Pain Pain - Right/Left: Right Pain - part of body: Ankle and joints of foot     Time: 9811-9147 PT Time Calculation (min) (ACUTE ONLY): 12 min  Charges:    $Gait Training: 8-22 mins PT General Charges $$ ACUTE PT VISIT: 1 Visit                     Shela Nevin, PT, DPT Acute Rehab Services [phone]    Gladys Damme 02/16/2023, 12:09 PM

## 2023-02-17 ENCOUNTER — Other Ambulatory Visit (HOSPITAL_COMMUNITY): Payer: Self-pay

## 2023-02-17 ENCOUNTER — Other Ambulatory Visit: Payer: Self-pay

## 2023-02-17 DIAGNOSIS — L03115 Cellulitis of right lower limb: Secondary | ICD-10-CM | POA: Diagnosis not present

## 2023-02-17 LAB — GLUCOSE, CAPILLARY: Glucose-Capillary: 104 mg/dL — ABNORMAL HIGH (ref 70–99)

## 2023-02-17 MED ORDER — CEFADROXIL 500 MG PO CAPS
1000.0000 mg | ORAL_CAPSULE | Freq: Two times a day (BID) | ORAL | 0 refills | Status: AC
  Filled 2023-02-17: qty 28, 7d supply, fill #0

## 2023-02-17 MED ORDER — METRONIDAZOLE 500 MG PO TABS
500.0000 mg | ORAL_TABLET | Freq: Two times a day (BID) | ORAL | 0 refills | Status: AC
  Filled 2023-02-17: qty 14, 7d supply, fill #0

## 2023-02-17 MED ORDER — OXYCODONE HCL 5 MG PO TABS
5.0000 mg | ORAL_TABLET | Freq: Four times a day (QID) | ORAL | 0 refills | Status: DC | PRN
  Filled 2023-02-17: qty 30, 8d supply, fill #0

## 2023-02-17 MED ORDER — LINEZOLID 600 MG PO TABS
600.0000 mg | ORAL_TABLET | Freq: Two times a day (BID) | ORAL | 0 refills | Status: AC
  Filled 2023-02-17: qty 14, 7d supply, fill #0

## 2023-02-17 NOTE — Progress Notes (Signed)
Physical Therapy Treatment Patient Details Name: Randy [name] MRN: [id number] DOB: 02/18/1971 Today's Date: 02/17/2023   History of Present Illness Patient is 52 y.o. male who presents with new abscess lateral malleolus right ankle. Pt was previously hospitalized 2 weeks ago for cellulitis in the right calf. Foot and ankle x-ray without osteomyelitis but subacute fracture fourth bone fractures with callus formation. 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    PT Comments  Pt tolerated treatment well today. Pt able to ambulate in hallway with RW Mod I. Pt has met all acute PT goals and has no further acute PT needs. PT will be signing off. Re consult PT if mobility status changes. Pt anticipates DC home today.   If plan is discharge home, recommend the following: A little help with walking and/or transfers;Direct supervision/assist for medications management;A little help with bathing/dressing/bathroom;Help with stairs or ramp for entrance;Assist for transportation   Can travel by private vehicle        Equipment Recommendations  Rolling walker (2 wheels)    Recommendations for Other Services       Precautions / Restrictions Precautions Precautions: Fall Restrictions Weight Bearing Restrictions: No     Mobility  Bed Mobility Overal bed mobility: Modified Independent Bed Mobility: Supine to Sit     Supine to sit: Modified independent (Device/Increase time), Used rails, HOB elevated          Transfers Overall transfer level: Independent Equipment used: None Transfers: Sit to/from Stand Sit to Stand: Independent           General transfer comment: use of bil UE to rise and control lower, no assist needed. pt able to stand from low bed height.    Ambulation/Gait Ambulation/Gait assistance: Modified independent  (Device/Increase time) Gait Distance (Feet): 650 Feet Assistive device: Rolling walker (2 wheels) Gait Pattern/deviations: Step-through pattern, Decreased stride length, Decreased stance time - right, Decreased weight shift to right, Antalgic, Wide base of support Gait velocity: decr     General Gait Details: mild antalgia with stance on Rt LE, no overt LOB   Stairs             Wheelchair Mobility     Tilt Bed    Modified Rankin (Stroke Patients Only)       Balance Overall balance assessment: Needs assistance Sitting-balance support: Feet supported Sitting balance-Leahy Scale: Good     Standing balance support: Bilateral upper extremity supported, During functional activity, Reliant on assistive device for balance Standing balance-Leahy Scale: Fair                              Cognition Arousal: Alert Behavior During Therapy: WFL for tasks assessed/performed Overall Cognitive Status: Within Functional Limits for tasks assessed                                          Exercises      General Comments General comments (skin integrity, edema, etc.): VSS      Pertinent Vitals/Pain Pain Assessment Pain Assessment: No/denies pain    Home Living                          Prior Function  PT Goals (current goals can now be found in the care plan section) Progress towards PT goals: Goals met/education completed, patient discharged from PT    Frequency    Min 1X/week      PT Plan      Co-evaluation              AM-PAC PT "6 Clicks" Mobility   Outcome Measure  Help needed turning from your back to your side while in a flat bed without using bedrails?: None Help needed moving from lying on your back to sitting on the side of a flat bed without using bedrails?: None Help needed moving to and from a bed to a chair (including a wheelchair)?: A Little Help needed standing up from a chair using your  arms (e.g., wheelchair or bedside chair)?: A Little Help needed to walk in hospital room?: A Little Help needed climbing 3-5 steps with a railing? : A Little 6 Click Score: 20    End of Session Equipment Utilized During Treatment: Gait belt Activity Tolerance: Patient tolerated treatment well Patient left: in bed;with call bell/phone within reach Nurse Communication: Mobility status PT Visit Diagnosis: Other abnormalities of gait and mobility (R26.89);Muscle weakness (generalized) (M62.81);Difficulty in walking, not elsewhere classified (R26.2);Pain Pain - Right/Left: Right Pain - part of body: Ankle and joints of foot     Time: 1610-9604 PT Time Calculation (min) (ACUTE ONLY): 11 min  Charges:    $Gait Training: 8-22 mins PT General Charges $$ ACUTE PT VISIT: 1 Visit                     Shela Nevin, PT, DPT Acute Rehab Services [phone]    Randy [name] 02/17/2023, 10:51 AM

## 2023-02-17 NOTE — Discharge Summary (Signed)
Physician Discharge Summary  Kiryn Lersch ZOX:[id number] DOB: [birth year] DOA: 02/11/2023  PCP: Oneita Hurt, No  Admit date: 02/11/2023 Discharge date: 02/17/2023  Admitted From: Home Disposition: Home  Recommendations for Outpatient Follow-up:  Follow up with PCP in 1-2 weeks Follow with orthopedics, Dr. Lajoyce Corners 1 week Continue antibiotics with Zyvox, cefadroxil, metronidazole to complete antibiotic course  Home Health: No Equipment/Devices: None  Discharge Condition: Stable CODE STATUS: Full code Diet recommendation: Heart healthy diet  History of present illness:  Randy [name] is a 52 y.o. male with past medical history significant for DM2, history of CVA, permanent atrial fibrillation on Eliquis, HFimpEF, CKD3b, HTN, history of osteomyelitis/abscess s/p right great toe amputation, morbid obesity who presented to [facility] ED by direction of his orthopedic surgeon Lajoyce Corners) on 10/17 for persistent right ankle cellulitis with concern for abscess and purulent drainage.  Recently hospitalized from 9/26 - 10/4 for right lower extremity cellulitis.  Was seen in follow-up after cast removal and noted to have purulent discharge and cellulitis and was sent to the ED for IV antibiotics and imaging to rule out osteomyelitis.   In the ED, temperature 98.6 F, HR 64, RR 18, BP 152/117, SpO2 100% on room air. WBC 10.8 (19.7 on 10/2). CRP 7.4. Cr 2.19. Foot and ankle x-ray without osteomyelitis but subacute fracture fourth bone fractures with callus formation. MRI showed small abscess measuring 0.6 x 3.9 x 4.5 cm under skin ulceration over lateral ankle but no osteomyelitis. MRI also showed mild to moderate edema within the peripheral aspect of the extensor digitorum longus muscle and portion of the flexor hallucis longus and peroneus brevis muscles.  Patient was started on IV antibiotics.  TRH consulted for admission for further evaluation and management.  Hospital course:  Right foot/ankle cellulitis with  abscess Patient presenting from orthopedic office for concerns of continued cellulitis with abscess formation of his right lower extremity.  Recently admitted for cellulitis and discharged on oral antibiotics.  On follow-up with orthopedics concern for purulent discharge and was sent to the ED for further evaluation and management.  Patient was afebrile, with slightly elevated WC count of 10.8 (19.7 on 10/2).  MRI RLE w/ findings small abscess measuring 0.6 x 3.9 x 4.5 cm under skin ulceration over lateral ankle but no osteomyelitis. MRI also showed mild to moderate edema within the peripheral aspect of the extensor digitorum longus muscle and portion of the flexor hallucis longus and peroneus brevis muscles.  Patient was initially started on vancomycin, cefepime and metronidazole.  Orthopedics was consulted and followed during hospital course, no need of surgical intervention.  Will continue linezolid, cefadroxil and metronidazole on discharge.  Outpatient follow-up with orthopedics, Dr. Lajoyce Corners 1 week.   Type 2 diabetes mellitus Hemoglobin A1c 6.5, well-controlled; continue Jardiance   CKD stage IIIb Cr 1.73, stable   HLD Atorvastatin 20 mg p.o. daily   Permanent atrial fibrillation Diltiazem CD 360 mg p.o. daily, Metoprolol succinate 100 mg p.o. daily, Eliquis 5 mg p.o. twice daily   Essential hypertension Hx HFimpEF Diltiazem CD 360 mg p.o. daily, Metoprolol succinate 100 mg p.o. daily, BiDil 20-37.5 mg p.o. 3 times daily   Morbid obesity Body mass index is 39.57 kg/m.  Complicates all facets of care.  Continue Mounjaro.  Discharge Diagnoses:  Principal Problem:   Cellulitis of right lower extremity Active Problems:   Morbid obesity (HCC)   Type 2 diabetes mellitus with complication, without long-term current use of insulin (HCC)   Permanent atrial fibrillation (HCC)   HTN (hypertension)  No radiographic evidence of osteomyelitis. If there is continued clinical concern, MRI is recommended. *Multiple other nonacute observations, as described above. Electronically Signed   By: Jules Schick M.D.   On: 01/21/2023 17:56   VAS [place] LOWER EXTREMITY VENOUS (DVT) (7a-7p)  Result Date: 01/21/2023  Lower Venous DVT Study Patient Name:  Randy [name]  Date of Exam:   01/21/2023 Medical Rec #: [id number]       Accession #:    [id number] Date of Birth: 23-Nov-1970       Patient Gender: M Patient Age:   81  years Exam Location:  [facility] Procedure:      VAS [place] LOWER EXTREMITY VENOUS (DVT) Referring Phys: Lyn Hollingshead SCHUTT --------------------------------------------------------------------------------  Indications: Pain.  Anticoagulation: Eliquis (afib). Limitations: Poor ultrasound/tissue interface. Comparison Study: Previous exam on 09/14/2021 was negative for DVT. Performing Technologist: Ernestene Mention RVT, RDMS  Examination Guidelines: A complete evaluation includes B-mode imaging, spectral Doppler, color Doppler, and power Doppler as needed of all accessible portions of each vessel. Bilateral testing is considered an integral part of a complete examination. Limited examinations for reoccurring indications may be performed as noted. The reflux portion of the exam is performed with the patient in reverse Trendelenburg.  +---------+---------------+---------+-----------+----------+--------------+ RIGHT    CompressibilityPhasicitySpontaneityPropertiesThrombus Aging +---------+---------------+---------+-----------+----------+--------------+ CFV      Full           Yes      Yes                                 +---------+---------------+---------+-----------+----------+--------------+ SFJ      Full                                                        +---------+---------------+---------+-----------+----------+--------------+ FV Prox  Full           Yes      Yes                                 +---------+---------------+---------+-----------+----------+--------------+ FV Mid   Full           Yes      Yes                                 +---------+---------------+---------+-----------+----------+--------------+ FV DistalFull           Yes      Yes                                 +---------+---------------+---------+-----------+----------+--------------+ PFV      Full                                                         +---------+---------------+---------+-----------+----------+--------------+ POP      Full           Yes      Yes                                 +---------+---------------+---------+-----------+----------+--------------+  No radiographic evidence of osteomyelitis. If there is continued clinical concern, MRI is recommended. *Multiple other nonacute observations, as described above. Electronically Signed   By: Jules Schick M.D.   On: 01/21/2023 17:56   VAS [place] LOWER EXTREMITY VENOUS (DVT) (7a-7p)  Result Date: 01/21/2023  Lower Venous DVT Study Patient Name:  Randy [name]  Date of Exam:   01/21/2023 Medical Rec #: [id number]       Accession #:    [id number] Date of Birth: 23-Nov-1970       Patient Gender: M Patient Age:   81  years Exam Location:  [facility] Procedure:      VAS [place] LOWER EXTREMITY VENOUS (DVT) Referring Phys: Lyn Hollingshead SCHUTT --------------------------------------------------------------------------------  Indications: Pain.  Anticoagulation: Eliquis (afib). Limitations: Poor ultrasound/tissue interface. Comparison Study: Previous exam on 09/14/2021 was negative for DVT. Performing Technologist: Ernestene Mention RVT, RDMS  Examination Guidelines: A complete evaluation includes B-mode imaging, spectral Doppler, color Doppler, and power Doppler as needed of all accessible portions of each vessel. Bilateral testing is considered an integral part of a complete examination. Limited examinations for reoccurring indications may be performed as noted. The reflux portion of the exam is performed with the patient in reverse Trendelenburg.  +---------+---------------+---------+-----------+----------+--------------+ RIGHT    CompressibilityPhasicitySpontaneityPropertiesThrombus Aging +---------+---------------+---------+-----------+----------+--------------+ CFV      Full           Yes      Yes                                 +---------+---------------+---------+-----------+----------+--------------+ SFJ      Full                                                        +---------+---------------+---------+-----------+----------+--------------+ FV Prox  Full           Yes      Yes                                 +---------+---------------+---------+-----------+----------+--------------+ FV Mid   Full           Yes      Yes                                 +---------+---------------+---------+-----------+----------+--------------+ FV DistalFull           Yes      Yes                                 +---------+---------------+---------+-----------+----------+--------------+ PFV      Full                                                         +---------+---------------+---------+-----------+----------+--------------+ POP      Full           Yes      Yes                                 +---------+---------------+---------+-----------+----------+--------------+  No radiographic evidence of osteomyelitis. If there is continued clinical concern, MRI is recommended. *Multiple other nonacute observations, as described above. Electronically Signed   By: Jules Schick M.D.   On: 01/21/2023 17:56   VAS [place] LOWER EXTREMITY VENOUS (DVT) (7a-7p)  Result Date: 01/21/2023  Lower Venous DVT Study Patient Name:  Randy [name]  Date of Exam:   01/21/2023 Medical Rec #: [id number]       Accession #:    [id number] Date of Birth: 23-Nov-1970       Patient Gender: M Patient Age:   81  years Exam Location:  [facility] Procedure:      VAS [place] LOWER EXTREMITY VENOUS (DVT) Referring Phys: Lyn Hollingshead SCHUTT --------------------------------------------------------------------------------  Indications: Pain.  Anticoagulation: Eliquis (afib). Limitations: Poor ultrasound/tissue interface. Comparison Study: Previous exam on 09/14/2021 was negative for DVT. Performing Technologist: Ernestene Mention RVT, RDMS  Examination Guidelines: A complete evaluation includes B-mode imaging, spectral Doppler, color Doppler, and power Doppler as needed of all accessible portions of each vessel. Bilateral testing is considered an integral part of a complete examination. Limited examinations for reoccurring indications may be performed as noted. The reflux portion of the exam is performed with the patient in reverse Trendelenburg.  +---------+---------------+---------+-----------+----------+--------------+ RIGHT    CompressibilityPhasicitySpontaneityPropertiesThrombus Aging +---------+---------------+---------+-----------+----------+--------------+ CFV      Full           Yes      Yes                                 +---------+---------------+---------+-----------+----------+--------------+ SFJ      Full                                                        +---------+---------------+---------+-----------+----------+--------------+ FV Prox  Full           Yes      Yes                                 +---------+---------------+---------+-----------+----------+--------------+ FV Mid   Full           Yes      Yes                                 +---------+---------------+---------+-----------+----------+--------------+ FV DistalFull           Yes      Yes                                 +---------+---------------+---------+-----------+----------+--------------+ PFV      Full                                                         +---------+---------------+---------+-----------+----------+--------------+ POP      Full           Yes      Yes                                 +---------+---------------+---------+-----------+----------+--------------+  No radiographic evidence of osteomyelitis. If there is continued clinical concern, MRI is recommended. *Multiple other nonacute observations, as described above. Electronically Signed   By: Jules Schick M.D.   On: 01/21/2023 17:56   VAS [place] LOWER EXTREMITY VENOUS (DVT) (7a-7p)  Result Date: 01/21/2023  Lower Venous DVT Study Patient Name:  Randy [name]  Date of Exam:   01/21/2023 Medical Rec #: [id number]       Accession #:    [id number] Date of Birth: 23-Nov-1970       Patient Gender: M Patient Age:   81  years Exam Location:  [facility] Procedure:      VAS [place] LOWER EXTREMITY VENOUS (DVT) Referring Phys: Lyn Hollingshead SCHUTT --------------------------------------------------------------------------------  Indications: Pain.  Anticoagulation: Eliquis (afib). Limitations: Poor ultrasound/tissue interface. Comparison Study: Previous exam on 09/14/2021 was negative for DVT. Performing Technologist: Ernestene Mention RVT, RDMS  Examination Guidelines: A complete evaluation includes B-mode imaging, spectral Doppler, color Doppler, and power Doppler as needed of all accessible portions of each vessel. Bilateral testing is considered an integral part of a complete examination. Limited examinations for reoccurring indications may be performed as noted. The reflux portion of the exam is performed with the patient in reverse Trendelenburg.  +---------+---------------+---------+-----------+----------+--------------+ RIGHT    CompressibilityPhasicitySpontaneityPropertiesThrombus Aging +---------+---------------+---------+-----------+----------+--------------+ CFV      Full           Yes      Yes                                 +---------+---------------+---------+-----------+----------+--------------+ SFJ      Full                                                        +---------+---------------+---------+-----------+----------+--------------+ FV Prox  Full           Yes      Yes                                 +---------+---------------+---------+-----------+----------+--------------+ FV Mid   Full           Yes      Yes                                 +---------+---------------+---------+-----------+----------+--------------+ FV DistalFull           Yes      Yes                                 +---------+---------------+---------+-----------+----------+--------------+ PFV      Full                                                         +---------+---------------+---------+-----------+----------+--------------+ POP      Full           Yes      Yes                                 +---------+---------------+---------+-----------+----------+--------------+  Physician Discharge Summary  Kiryn Lersch ZOX:[id number] DOB: [birth year] DOA: 02/11/2023  PCP: Oneita Hurt, No  Admit date: 02/11/2023 Discharge date: 02/17/2023  Admitted From: Home Disposition: Home  Recommendations for Outpatient Follow-up:  Follow up with PCP in 1-2 weeks Follow with orthopedics, Dr. Lajoyce Corners 1 week Continue antibiotics with Zyvox, cefadroxil, metronidazole to complete antibiotic course  Home Health: No Equipment/Devices: None  Discharge Condition: Stable CODE STATUS: Full code Diet recommendation: Heart healthy diet  History of present illness:  Randy [name] is a 52 y.o. male with past medical history significant for DM2, history of CVA, permanent atrial fibrillation on Eliquis, HFimpEF, CKD3b, HTN, history of osteomyelitis/abscess s/p right great toe amputation, morbid obesity who presented to [facility] ED by direction of his orthopedic surgeon Lajoyce Corners) on 10/17 for persistent right ankle cellulitis with concern for abscess and purulent drainage.  Recently hospitalized from 9/26 - 10/4 for right lower extremity cellulitis.  Was seen in follow-up after cast removal and noted to have purulent discharge and cellulitis and was sent to the ED for IV antibiotics and imaging to rule out osteomyelitis.   In the ED, temperature 98.6 F, HR 64, RR 18, BP 152/117, SpO2 100% on room air. WBC 10.8 (19.7 on 10/2). CRP 7.4. Cr 2.19. Foot and ankle x-ray without osteomyelitis but subacute fracture fourth bone fractures with callus formation. MRI showed small abscess measuring 0.6 x 3.9 x 4.5 cm under skin ulceration over lateral ankle but no osteomyelitis. MRI also showed mild to moderate edema within the peripheral aspect of the extensor digitorum longus muscle and portion of the flexor hallucis longus and peroneus brevis muscles.  Patient was started on IV antibiotics.  TRH consulted for admission for further evaluation and management.  Hospital course:  Right foot/ankle cellulitis with  abscess Patient presenting from orthopedic office for concerns of continued cellulitis with abscess formation of his right lower extremity.  Recently admitted for cellulitis and discharged on oral antibiotics.  On follow-up with orthopedics concern for purulent discharge and was sent to the ED for further evaluation and management.  Patient was afebrile, with slightly elevated WC count of 10.8 (19.7 on 10/2).  MRI RLE w/ findings small abscess measuring 0.6 x 3.9 x 4.5 cm under skin ulceration over lateral ankle but no osteomyelitis. MRI also showed mild to moderate edema within the peripheral aspect of the extensor digitorum longus muscle and portion of the flexor hallucis longus and peroneus brevis muscles.  Patient was initially started on vancomycin, cefepime and metronidazole.  Orthopedics was consulted and followed during hospital course, no need of surgical intervention.  Will continue linezolid, cefadroxil and metronidazole on discharge.  Outpatient follow-up with orthopedics, Dr. Lajoyce Corners 1 week.   Type 2 diabetes mellitus Hemoglobin A1c 6.5, well-controlled; continue Jardiance   CKD stage IIIb Cr 1.73, stable   HLD Atorvastatin 20 mg p.o. daily   Permanent atrial fibrillation Diltiazem CD 360 mg p.o. daily, Metoprolol succinate 100 mg p.o. daily, Eliquis 5 mg p.o. twice daily   Essential hypertension Hx HFimpEF Diltiazem CD 360 mg p.o. daily, Metoprolol succinate 100 mg p.o. daily, BiDil 20-37.5 mg p.o. 3 times daily   Morbid obesity Body mass index is 39.57 kg/m.  Complicates all facets of care.  Continue Mounjaro.  Discharge Diagnoses:  Principal Problem:   Cellulitis of right lower extremity Active Problems:   Morbid obesity (HCC)   Type 2 diabetes mellitus with complication, without long-term current use of insulin (HCC)   Permanent atrial fibrillation (HCC)   HTN (hypertension)  No radiographic evidence of osteomyelitis. If there is continued clinical concern, MRI is recommended. *Multiple other nonacute observations, as described above. Electronically Signed   By: Jules Schick M.D.   On: 01/21/2023 17:56   VAS [place] LOWER EXTREMITY VENOUS (DVT) (7a-7p)  Result Date: 01/21/2023  Lower Venous DVT Study Patient Name:  Randy [name]  Date of Exam:   01/21/2023 Medical Rec #: [id number]       Accession #:    [id number] Date of Birth: 23-Nov-1970       Patient Gender: M Patient Age:   81  years Exam Location:  [facility] Procedure:      VAS [place] LOWER EXTREMITY VENOUS (DVT) Referring Phys: Lyn Hollingshead SCHUTT --------------------------------------------------------------------------------  Indications: Pain.  Anticoagulation: Eliquis (afib). Limitations: Poor ultrasound/tissue interface. Comparison Study: Previous exam on 09/14/2021 was negative for DVT. Performing Technologist: Ernestene Mention RVT, RDMS  Examination Guidelines: A complete evaluation includes B-mode imaging, spectral Doppler, color Doppler, and power Doppler as needed of all accessible portions of each vessel. Bilateral testing is considered an integral part of a complete examination. Limited examinations for reoccurring indications may be performed as noted. The reflux portion of the exam is performed with the patient in reverse Trendelenburg.  +---------+---------------+---------+-----------+----------+--------------+ RIGHT    CompressibilityPhasicitySpontaneityPropertiesThrombus Aging +---------+---------------+---------+-----------+----------+--------------+ CFV      Full           Yes      Yes                                 +---------+---------------+---------+-----------+----------+--------------+ SFJ      Full                                                        +---------+---------------+---------+-----------+----------+--------------+ FV Prox  Full           Yes      Yes                                 +---------+---------------+---------+-----------+----------+--------------+ FV Mid   Full           Yes      Yes                                 +---------+---------------+---------+-----------+----------+--------------+ FV DistalFull           Yes      Yes                                 +---------+---------------+---------+-----------+----------+--------------+ PFV      Full                                                         +---------+---------------+---------+-----------+----------+--------------+ POP      Full           Yes      Yes                                 +---------+---------------+---------+-----------+----------+--------------+  No radiographic evidence of osteomyelitis. If there is continued clinical concern, MRI is recommended. *Multiple other nonacute observations, as described above. Electronically Signed   By: Jules Schick M.D.   On: 01/21/2023 17:56   VAS [place] LOWER EXTREMITY VENOUS (DVT) (7a-7p)  Result Date: 01/21/2023  Lower Venous DVT Study Patient Name:  Randy [name]  Date of Exam:   01/21/2023 Medical Rec #: [id number]       Accession #:    [id number] Date of Birth: 23-Nov-1970       Patient Gender: M Patient Age:   81  years Exam Location:  [facility] Procedure:      VAS [place] LOWER EXTREMITY VENOUS (DVT) Referring Phys: Lyn Hollingshead SCHUTT --------------------------------------------------------------------------------  Indications: Pain.  Anticoagulation: Eliquis (afib). Limitations: Poor ultrasound/tissue interface. Comparison Study: Previous exam on 09/14/2021 was negative for DVT. Performing Technologist: Ernestene Mention RVT, RDMS  Examination Guidelines: A complete evaluation includes B-mode imaging, spectral Doppler, color Doppler, and power Doppler as needed of all accessible portions of each vessel. Bilateral testing is considered an integral part of a complete examination. Limited examinations for reoccurring indications may be performed as noted. The reflux portion of the exam is performed with the patient in reverse Trendelenburg.  +---------+---------------+---------+-----------+----------+--------------+ RIGHT    CompressibilityPhasicitySpontaneityPropertiesThrombus Aging +---------+---------------+---------+-----------+----------+--------------+ CFV      Full           Yes      Yes                                 +---------+---------------+---------+-----------+----------+--------------+ SFJ      Full                                                        +---------+---------------+---------+-----------+----------+--------------+ FV Prox  Full           Yes      Yes                                 +---------+---------------+---------+-----------+----------+--------------+ FV Mid   Full           Yes      Yes                                 +---------+---------------+---------+-----------+----------+--------------+ FV DistalFull           Yes      Yes                                 +---------+---------------+---------+-----------+----------+--------------+ PFV      Full                                                         +---------+---------------+---------+-----------+----------+--------------+ POP      Full           Yes      Yes                                 +---------+---------------+---------+-----------+----------+--------------+  No radiographic evidence of osteomyelitis. If there is continued clinical concern, MRI is recommended. *Multiple other nonacute observations, as described above. Electronically Signed   By: Jules Schick M.D.   On: 01/21/2023 17:56   VAS [place] LOWER EXTREMITY VENOUS (DVT) (7a-7p)  Result Date: 01/21/2023  Lower Venous DVT Study Patient Name:  Randy [name]  Date of Exam:   01/21/2023 Medical Rec #: [id number]       Accession #:    [id number] Date of Birth: 23-Nov-1970       Patient Gender: M Patient Age:   81  years Exam Location:  [facility] Procedure:      VAS [place] LOWER EXTREMITY VENOUS (DVT) Referring Phys: Lyn Hollingshead SCHUTT --------------------------------------------------------------------------------  Indications: Pain.  Anticoagulation: Eliquis (afib). Limitations: Poor ultrasound/tissue interface. Comparison Study: Previous exam on 09/14/2021 was negative for DVT. Performing Technologist: Ernestene Mention RVT, RDMS  Examination Guidelines: A complete evaluation includes B-mode imaging, spectral Doppler, color Doppler, and power Doppler as needed of all accessible portions of each vessel. Bilateral testing is considered an integral part of a complete examination. Limited examinations for reoccurring indications may be performed as noted. The reflux portion of the exam is performed with the patient in reverse Trendelenburg.  +---------+---------------+---------+-----------+----------+--------------+ RIGHT    CompressibilityPhasicitySpontaneityPropertiesThrombus Aging +---------+---------------+---------+-----------+----------+--------------+ CFV      Full           Yes      Yes                                 +---------+---------------+---------+-----------+----------+--------------+ SFJ      Full                                                        +---------+---------------+---------+-----------+----------+--------------+ FV Prox  Full           Yes      Yes                                 +---------+---------------+---------+-----------+----------+--------------+ FV Mid   Full           Yes      Yes                                 +---------+---------------+---------+-----------+----------+--------------+ FV DistalFull           Yes      Yes                                 +---------+---------------+---------+-----------+----------+--------------+ PFV      Full                                                         +---------+---------------+---------+-----------+----------+--------------+ POP      Full           Yes      Yes                                 +---------+---------------+---------+-----------+----------+--------------+  No radiographic evidence of osteomyelitis. If there is continued clinical concern, MRI is recommended. *Multiple other nonacute observations, as described above. Electronically Signed   By: Jules Schick M.D.   On: 01/21/2023 17:56   VAS [place] LOWER EXTREMITY VENOUS (DVT) (7a-7p)  Result Date: 01/21/2023  Lower Venous DVT Study Patient Name:  Randy [name]  Date of Exam:   01/21/2023 Medical Rec #: [id number]       Accession #:    [id number] Date of Birth: 23-Nov-1970       Patient Gender: M Patient Age:   81  years Exam Location:  [facility] Procedure:      VAS [place] LOWER EXTREMITY VENOUS (DVT) Referring Phys: Lyn Hollingshead SCHUTT --------------------------------------------------------------------------------  Indications: Pain.  Anticoagulation: Eliquis (afib). Limitations: Poor ultrasound/tissue interface. Comparison Study: Previous exam on 09/14/2021 was negative for DVT. Performing Technologist: Ernestene Mention RVT, RDMS  Examination Guidelines: A complete evaluation includes B-mode imaging, spectral Doppler, color Doppler, and power Doppler as needed of all accessible portions of each vessel. Bilateral testing is considered an integral part of a complete examination. Limited examinations for reoccurring indications may be performed as noted. The reflux portion of the exam is performed with the patient in reverse Trendelenburg.  +---------+---------------+---------+-----------+----------+--------------+ RIGHT    CompressibilityPhasicitySpontaneityPropertiesThrombus Aging +---------+---------------+---------+-----------+----------+--------------+ CFV      Full           Yes      Yes                                 +---------+---------------+---------+-----------+----------+--------------+ SFJ      Full                                                        +---------+---------------+---------+-----------+----------+--------------+ FV Prox  Full           Yes      Yes                                 +---------+---------------+---------+-----------+----------+--------------+ FV Mid   Full           Yes      Yes                                 +---------+---------------+---------+-----------+----------+--------------+ FV DistalFull           Yes      Yes                                 +---------+---------------+---------+-----------+----------+--------------+ PFV      Full                                                         +---------+---------------+---------+-----------+----------+--------------+ POP      Full           Yes      Yes                                 +---------+---------------+---------+-----------+----------+--------------+

## 2023-02-17 NOTE — Plan of Care (Addendum)
Patient alert/oriented X4. Patient compliant with medication administration and tolerated IV abx. Patient complains of no pain. Dressing changed on RLE, AVS discharge instructions explained in detail, and PIV removed prior to discharge. Patient belongings are packed up at bedside. VSS.  Problem: Education: Goal: Ability to describe self-care measures that may prevent or decrease complications (Diabetes Survival Skills Education) will improve Outcome: Adequate for Discharge   Problem: Education: Goal: Individualized Educational Video(s) Outcome: Adequate for Discharge   Problem: Coping: Goal: Ability to adjust to condition or change in health will improve Outcome: Adequate for Discharge   Problem: Fluid Volume: Goal: Ability to maintain a balanced intake and output will improve Outcome: Adequate for Discharge   Problem: Health Behavior/Discharge Planning: Goal: Ability to identify and utilize available resources and services will improve Outcome: Adequate for Discharge   Problem: Health Behavior/Discharge Planning: Goal: Ability to manage health-related needs will improve Outcome: Adequate for Discharge   Problem: Metabolic: Goal: Ability to maintain appropriate glucose levels will improve Outcome: Adequate for Discharge   Problem: Nutritional: Goal: Maintenance of adequate nutrition will improve Outcome: Adequate for Discharge   Problem: Nutritional: Goal: Progress toward achieving an optimal weight will improve Outcome: Adequate for Discharge   Problem: Skin Integrity: Goal: Risk for impaired skin integrity will decrease Outcome: Adequate for Discharge   Problem: Tissue Perfusion: Goal: Adequacy of tissue perfusion will improve Outcome: Adequate for Discharge   Problem: Education: Goal: Knowledge of General Education information will improve Description: Including pain rating scale, medication(s)/side effects and non-pharmacologic comfort measures Outcome: Adequate  for Discharge   Problem: Health Behavior/Discharge Planning: Goal: Ability to manage health-related needs will improve Outcome: Adequate for Discharge   Problem: Clinical Measurements: Goal: Ability to maintain clinical measurements within normal limits will improve Outcome: Adequate for Discharge   Problem: Clinical Measurements: Goal: Will remain free from infection Outcome: Adequate for Discharge   Problem: Clinical Measurements: Goal: Diagnostic test results will improve Outcome: Adequate for Discharge   Problem: Clinical Measurements: Goal: Respiratory complications will improve Outcome: Adequate for Discharge   Problem: Clinical Measurements: Goal: Cardiovascular complication will be avoided Outcome: Adequate for Discharge   Problem: Activity: Goal: Risk for activity intolerance will decrease Outcome: Adequate for Discharge   Problem: Nutrition: Goal: Adequate nutrition will be maintained Outcome: Adequate for Discharge   Problem: Coping: Goal: Level of anxiety will decrease Outcome: Adequate for Discharge   Problem: Elimination: Goal: Will not experience complications related to bowel motility Outcome: Adequate for Discharge   Problem: Elimination: Goal: Will not experience complications related to urinary retention Outcome: Adequate for Discharge   Problem: Pain Managment: Goal: General experience of comfort will improve Outcome: Adequate for Discharge   Problem: Safety: Goal: Ability to remain free from injury will improve Outcome: Adequate for Discharge   Problem: Skin Integrity: Goal: Risk for impaired skin integrity will decrease Outcome: Adequate for Discharge

## 2023-02-24 ENCOUNTER — Ambulatory Visit (INDEPENDENT_AMBULATORY_CARE_PROVIDER_SITE_OTHER): Payer: 59 | Admitting: Family

## 2023-02-24 DIAGNOSIS — L03115 Cellulitis of right lower limb: Secondary | ICD-10-CM | POA: Diagnosis not present

## 2023-02-25 ENCOUNTER — Other Ambulatory Visit (HOSPITAL_COMMUNITY): Payer: Self-pay

## 2023-02-25 ENCOUNTER — Other Ambulatory Visit: Payer: Self-pay

## 2023-02-26 ENCOUNTER — Encounter: Payer: Self-pay | Admitting: Family

## 2023-02-28 ENCOUNTER — Other Ambulatory Visit (HOSPITAL_COMMUNITY): Payer: Self-pay | Admitting: Internal Medicine

## 2023-02-28 DIAGNOSIS — I5022 Chronic systolic (congestive) heart failure: Secondary | ICD-10-CM

## 2023-03-01 ENCOUNTER — Other Ambulatory Visit (HOSPITAL_COMMUNITY): Payer: Self-pay

## 2023-03-03 ENCOUNTER — Other Ambulatory Visit (HOSPITAL_COMMUNITY): Payer: Self-pay

## 2023-03-09 ENCOUNTER — Other Ambulatory Visit (HOSPITAL_COMMUNITY): Payer: Self-pay

## 2023-03-09 ENCOUNTER — Other Ambulatory Visit: Payer: Self-pay

## 2023-03-10 ENCOUNTER — Telehealth: Payer: Self-pay | Admitting: Cardiology

## 2023-03-10 ENCOUNTER — Other Ambulatory Visit (HOSPITAL_COMMUNITY): Payer: Self-pay

## 2023-03-10 ENCOUNTER — Ambulatory Visit (INDEPENDENT_AMBULATORY_CARE_PROVIDER_SITE_OTHER): Payer: 59 | Admitting: Family

## 2023-03-10 DIAGNOSIS — L02611 Cutaneous abscess of right foot: Secondary | ICD-10-CM | POA: Diagnosis not present

## 2023-03-10 DIAGNOSIS — L03115 Cellulitis of right lower limb: Secondary | ICD-10-CM

## 2023-03-10 DIAGNOSIS — Z89411 Acquired absence of right great toe: Secondary | ICD-10-CM | POA: Diagnosis not present

## 2023-03-10 DIAGNOSIS — S98111A Complete traumatic amputation of right great toe, initial encounter: Secondary | ICD-10-CM

## 2023-03-10 MED ORDER — DILTIAZEM HCL ER COATED BEADS 360 MG PO CP24
360.0000 mg | ORAL_CAPSULE | Freq: Every day | ORAL | 3 refills | Status: DC
  Filled 2023-03-10: qty 30, 30d supply, fill #0
  Filled 2023-04-14: qty 30, 30d supply, fill #1
  Filled 2023-05-06: qty 30, 30d supply, fill #2

## 2023-03-10 NOTE — Telephone Encounter (Signed)
RX sent to requested Pharmacy

## 2023-03-10 NOTE — Telephone Encounter (Signed)
*  STAT* If patient is at the pharmacy, call can be transferred to refill team.   1. Which medications need to be refilled? (please list name of each medication and dose if known) diltiazem (CARDIZEM CD) 360 MG 24 hr capsule    2. Would you like to learn more about the convenience, safety, & potential cost savings by using the [facility]?    3. Are you open to using the [facility] (Type [facility]. ).   4. Which pharmacy/location (including street and city if local pharmacy) is medication to be sent to? [place] [facility]    5. Do they need a 30 day or 90 day supply? 30 day   Patient is out of medication

## 2023-03-12 ENCOUNTER — Encounter: Payer: Self-pay | Admitting: Family

## 2023-03-12 NOTE — Progress Notes (Signed)
Office Visit Note   Patient: Randy [name]           Date of Birth: [birth year]           MRN: [id number] Visit Date: 03/10/2023              Requested by: No referring provider defined for this encounter. PCP: Pcp, No  Chief Complaint  Patient presents with   Right Foot - Follow-up      HPI: The patient is a 52 year old gentleman who is seen in follow-up for right lower extremity.  He has had recent hospitalization with cellulitis and abscess.  He continues with 1 open area along the lateral aspect of his right leg this has been treated for the last week with medical compression stockings with direct skin contact this continues to heal remarkably  Assessment & Plan: Visit Diagnoses: No diagnosis found.  Plan: He will continue with current wound care.  Cleansing daily minimizing weightbearing using the compression stocking with direct skin contact Follow-Up Instructions: Return in about 2 weeks (around 03/24/2023).   Ortho Exam  Patient is alert, oriented, no adenopathy, well-dressed, normal affect, normal respiratory effort. On examination right lower extremity the medial and posterior ulcers are well-healed laterally he has 1 remaining ulcer please see attached image.  This continues to have circumferential healing of about 5 mm.  This area is approximately 7 x 4 cm at this time with 50% fibrinous exudative tissue there is no sign of infection or drainage  Imaging: No results found. No images are attached to the encounter.  Labs: Lab Results  Component Value Date   HGBA1C 6.5 (H) 02/11/2023   HGBA1C 5.8 (A) 12/22/2022   HGBA1C 6.4 (A) 08/12/2022   ESRSEDRATE 54 (H) 02/11/2023   ESRSEDRATE 30 (H) 01/22/2023   ESRSEDRATE 3 08/07/2020   CRP 7.4 (H) 02/11/2023   CRP 26.4 (H) 01/22/2023   REPTSTATUS 01/26/2023 FINAL 01/21/2023   CULT  01/21/2023    NO GROWTH 5 DAYS Performed at [facility], [address], [place], [place] [address]      Lab Results   Component Value Date   ALBUMIN 2.5 (L) 02/15/2023   ALBUMIN 2.5 (L) 02/14/2023   ALBUMIN 2.3 (L) 02/13/2023    Lab Results  Component Value Date   MG 1.9 02/15/2023   MG 2.0 02/14/2023   MG 1.9 02/13/2023   No results found for: "VD25OH"  No results found for: "PREALBUMIN"    Latest Ref Rng & Units 02/15/2023    8:34 AM 02/14/2023    9:23 AM 02/13/2023    3:33 AM  CBC EXTENDED  WBC 4.0 - 10.5 K/uL 8.8  9.0  10.7   RBC 4.22 - 5.81 MIL/uL 4.87  4.84  4.54   Hemoglobin 13.0 - 17.0 g/dL 19.1  47.8  29.5   HCT 39.0 - 52.0 % 38.7  38.2  35.7   Platelets 150 - 400 K/uL 384  367  353      There is no height or weight on file to calculate BMI.  Orders:  No orders of the defined types were placed in this encounter.  No orders of the defined types were placed in this encounter.    Procedures: No procedures performed  Clinical Data: No additional findings.  ROS:  All other systems negative, except as noted in the HPI. Review of Systems  Objective: Vital Signs: There were no vitals taken for this visit.  Specialty Comments:  No specialty  comments available.  PMFS History: Patient Active Problem List   Diagnosis Date Noted   Abscess of skin of right ankle 02/13/2023   Chronic kidney disease, stage 3b (HCC) 02/11/2023   Typical atrial flutter (HCC) 01/26/2023   Sepsis (HCC) 01/21/2023   Persistent atrial fibrillation (HCC) 11/23/2022   Hypercoagulable state due to persistent atrial fibrillation (HCC) 05/21/2022   Osteomyelitis of great toe of right foot (HCC)    Cutaneous abscess of right foot    Cellulitis of right lower extremity 09/15/2021   Leg pain, medial, right 09/14/2021   Permanent atrial fibrillation (HCC) 01/27/2021   SIRS (systemic inflammatory response syndrome) (HCC) 01/27/2021   HTN (hypertension) 01/27/2021   Hypokalemia 01/27/2021   Acute systolic CHF (congestive heart failure) (HCC) 01/27/2021   New onset of congestive heart failure (HCC)  01/26/2021   Acute CVA (cerebrovascular accident) (HCC) 08/07/2020   Acute upper respiratory infection 11/27/2014   Numbness of finger 09/19/2011   Hilar adenopathy    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HCC) 09/05/2010   Type 2 diabetes mellitus with complication, without long-term current use of insulin (HCC) 04/23/2010   Renal insufficiency 03/21/2008   VISUAL ACUITY, DECREASED, LEFT EYE 03/19/2008   BELL'S PALSY, LEFT 01/30/2008   Morbid obesity (HCC) 01/20/2008   Malignant hypertension with heart failure and stage 3 chronic kidney disease (HCC) 01/20/2008   Past Medical History:  Diagnosis Date   CKD (chronic kidney disease) stage 3, GFR 30-59 ml/min (HCC)    baseline Cr 1.5-1.7   Decreased visual acuity    Left eye, resolved - hypertensive retinopathy   Diabetes mellitus 2012   Type II   Hilar adenopathy    on CT scan 02/2010, on rpt scan stable/improved.   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   Systolic murmur    Vitamin D deficiency     Family History  Problem Relation Age of Onset   Hypertension Mother    Hypertension Father    Diabetes Father    Diabetes Sister    Hypertension Brother    Kidney disease Paternal Grandmother        ESRD   Coronary artery disease Neg Hx    Stroke Neg Hx    Cancer Neg Hx     Past Surgical History:  Procedure Laterality Date   AMPUTATION Right 09/17/2021   Procedure: First ray amputation;  Surgeon: Nadara Mustard, MD;  Location: [facility] OR;  Service: Orthopedics;  Laterality: Right;   APPLICATION OF WOUND VAC Right 10/10/2021   Procedure: APPLICATION OF WOUND VAC;  Surgeon: Nadara Mustard, MD;  Location: [facility] OR;  Service: Orthopedics;  Laterality: Right;    ATRIAL FIBRILLATION ABLATION N/A 10/02/2022   Procedure: ATRIAL FIBRILLATION ABLATION;  Surgeon: Regan Lemming, MD;  Location: [facility] INVASIVE CV LAB;  Service: Cardiovascular;  Laterality: N/A;   CARDIOVERSION N/A 01/31/2021   Procedure: CARDIOVERSION;  Surgeon: Dolores Patty, MD;  Location: [facility] ENDOSCOPY;  Service: Cardiovascular;  Laterality: N/A;   CARDIOVERSION N/A 07/04/2021   Procedure: CARDIOVERSION;  Surgeon: Dolores Patty, MD;  Location: [name] ENDOSCOPY;  Service: Cardiovascular;  Laterality: N/A;   hospitalization  11/2008   malignant HTN, nl SPEP/UPEP, neg ANCA panel, nl C3/4, neg anti GBM Ab, neg Hep A/B/C, nl renal US, nl PTH, neg HIV   I & D EXTREMITY Right 10/10/2021   Procedure: RIGHT FOOT DEBRIDEMENT;  Surgeon: Nadara Mustard, MD;  Location: [facility] OR;  Service: Orthopedics;  Laterality: Right;   RIGHT/LEFT HEART CATH AND CORONARY ANGIOGRAPHY N/A 01/29/2021   Procedure: RIGHT/LEFT HEART CATH AND CORONARY ANGIOGRAPHY;  Surgeon: Dolores Patty, MD;  Location: [facility] INVASIVE CV LAB;  Service: Cardiovascular;  Laterality: N/A;   TEE WITHOUT CARDIOVERSION N/A 01/31/2021   Procedure: TRANSESOPHAGEAL ECHOCARDIOGRAM (TEE);  Surgeon: Dolores Patty, MD;  Location: [facility] ENDOSCOPY;  Service: Cardiovascular;  Laterality: N/A;   TEE WITHOUT CARDIOVERSION N/A 10/02/2022   Procedure: TRANSESOPHAGEAL ECHOCARDIOGRAM;  Surgeon: Regan Lemming, MD;  Location: [facility] INVASIVE CV LAB;  Service: Cardiovascular;  Laterality: N/A;   US ECHOCARDIOGRAPHY  11/2008   LVsys fxn EF 50%, mild MR, normal LV size, neg ANA, neg cryoglobulins   US ECHOCARDIOGRAPHY  2011   severe LVH, EF 40%, LA mildly dilated, PA pressure moderately increased   Social History   Occupational History   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    Comment: [place]  Tobacco Use   Smoking status: Former    Types: Cigars   Smokeless tobacco: Never   Tobacco comments:    Former smoker 01/01/23  Vaping Use   Vaping status: Never  Used  Substance and Sexual Activity   Alcohol use: Not Currently    Comment: stop drinking 01/01/23   Drug use: No   Sexual activity: Not on file

## 2023-03-15 ENCOUNTER — Other Ambulatory Visit (HOSPITAL_COMMUNITY): Payer: Self-pay

## 2023-03-18 ENCOUNTER — Other Ambulatory Visit: Payer: Self-pay

## 2023-03-18 ENCOUNTER — Ambulatory Visit (INDEPENDENT_AMBULATORY_CARE_PROVIDER_SITE_OTHER): Payer: 59 | Admitting: Orthopedic Surgery

## 2023-03-18 DIAGNOSIS — L03115 Cellulitis of right lower limb: Secondary | ICD-10-CM | POA: Diagnosis not present

## 2023-03-19 ENCOUNTER — Other Ambulatory Visit (HOSPITAL_COMMUNITY): Payer: Self-pay

## 2023-03-19 ENCOUNTER — Other Ambulatory Visit (HOSPITAL_COMMUNITY): Payer: Self-pay | Admitting: Adult Health

## 2023-03-21 ENCOUNTER — Other Ambulatory Visit (HOSPITAL_COMMUNITY): Payer: Self-pay | Admitting: Family Medicine

## 2023-03-22 ENCOUNTER — Encounter: Payer: Self-pay | Admitting: Internal Medicine

## 2023-03-22 ENCOUNTER — Encounter: Payer: Self-pay | Admitting: Cardiology

## 2023-03-26 ENCOUNTER — Other Ambulatory Visit (HOSPITAL_COMMUNITY): Payer: Self-pay | Admitting: Adult Health

## 2023-03-29 ENCOUNTER — Other Ambulatory Visit (HOSPITAL_COMMUNITY): Payer: Self-pay

## 2023-03-29 ENCOUNTER — Encounter: Payer: Self-pay | Admitting: Orthopedic Surgery

## 2023-03-29 ENCOUNTER — Telehealth: Payer: Self-pay | Admitting: Cardiology

## 2023-03-29 NOTE — Telephone Encounter (Signed)
Pt c/o medication issue:  1. Name of Medication:    2. How are you currently taking this medication (dosage and times per day)?    3. Are you having a reaction (difficulty breathing--STAT)? no  4. What is your medication issue? Patient was calling to get a refill on medication but its not listed under current medication. Please advise

## 2023-03-29 NOTE — Telephone Encounter (Signed)
Randy [name] was discontinued at discharge 01/21/23  Pt is overdue for follow up CHF FU 12/11 @ 830  Appt details left on VM

## 2023-03-29 NOTE — Telephone Encounter (Signed)
A lasix refill was sent on 11/26\ Again pt is overdue for follow up  Add on 12/11 @ 830  Appt details left on VM

## 2023-03-29 NOTE — Progress Notes (Signed)
Office Visit Note   Patient: Randy [name]           Date of Birth: [birth year]           MRN: [id number] Visit Date: 03/18/2023              Requested by: No referring provider defined for this encounter. PCP: Pcp, No  Chief Complaint  Patient presents with   Right Foot - Follow-up   Right Leg - Follow-up      HPI: Patient is a 52 year old gentleman who presents in follow-up for right foot and leg cellulitis.  Currently in compression socks with Dial soap cleansing.  Assessment & Plan: Visit Diagnoses:  1. Cellulitis of right lower extremity     Plan: Patient was given a 20-30 XXL sock to be worn daily.  A note was provided to return to work on Monday, November 25.  Follow-Up Instructions: Return in about 4 weeks (around 04/15/2023).   Ortho Exam  Patient is alert, oriented, no adenopathy, well-dressed, normal affect, normal respiratory effort. Examination the ulcers medial and lateral calf have resolved the first ray amputation is well-healed.  There is a lateral ulcer that measures 2 x 3 cm.  Patient's compression sock is torn.  Imaging: No results found. No images are attached to the encounter.  Labs: Lab Results  Component Value Date   HGBA1C 6.5 (H) 02/11/2023   HGBA1C 5.8 (A) 12/22/2022   HGBA1C 6.4 (A) 08/12/2022   ESRSEDRATE 54 (H) 02/11/2023   ESRSEDRATE 30 (H) 01/22/2023   ESRSEDRATE 3 08/07/2020   CRP 7.4 (H) 02/11/2023   CRP 26.4 (H) 01/22/2023   REPTSTATUS 01/26/2023 FINAL 01/21/2023   CULT  01/21/2023    NO GROWTH 5 DAYS Performed at [facility], [address] [place], [place] [address]      Lab Results  Component Value Date   ALBUMIN 2.5 (L) 02/15/2023   ALBUMIN 2.5 (L) 02/14/2023   ALBUMIN 2.3 (L) 02/13/2023    Lab Results  Component Value Date   MG 1.9 02/15/2023   MG 2.0 02/14/2023   MG 1.9 02/13/2023   No results found for: "VD25OH"  No results found for: "PREALBUMIN"    Latest Ref Rng & Units 02/15/2023     8:34 AM 02/14/2023    9:23 AM 02/13/2023    3:33 AM  CBC EXTENDED  WBC 4.0 - 10.5 K/uL 8.8  9.0  10.7   RBC 4.22 - 5.81 MIL/uL 4.87  4.84  4.54   Hemoglobin 13.0 - 17.0 g/dL 29.5  18.8  41.6   HCT 39.0 - 52.0 % 38.7  38.2  35.7   Platelets 150 - 400 K/uL 384  367  353      There is no height or weight on file to calculate BMI.  Orders:  No orders of the defined types were placed in this encounter.  No orders of the defined types were placed in this encounter.    Procedures: No procedures performed  Clinical Data: No additional findings.  ROS:  All other systems negative, except as noted in the HPI. Review of Systems  Objective: Vital Signs: There were no vitals taken for this visit.  Specialty Comments:  No specialty comments available.  PMFS History: Patient Active Problem List   Diagnosis Date Noted   Abscess of skin of right ankle 02/13/2023   Chronic kidney disease, stage 3b (HCC) 02/11/2023   Typical atrial flutter (HCC) 01/26/2023   Sepsis (HCC) 01/21/2023  Persistent atrial fibrillation (HCC) 11/23/2022   Hypercoagulable state due to persistent atrial fibrillation (HCC) 05/21/2022   Osteomyelitis of great toe of right foot (HCC)    Cutaneous abscess of right foot    Cellulitis of right lower extremity 09/15/2021   Leg pain, medial, right 09/14/2021   Permanent atrial fibrillation (HCC) 01/27/2021   SIRS (systemic inflammatory response syndrome) (HCC) 01/27/2021   HTN (hypertension) 01/27/2021   Hypokalemia 01/27/2021   Acute systolic CHF (congestive heart failure) (HCC) 01/27/2021   New onset of congestive heart failure (HCC) 01/26/2021   Acute CVA (cerebrovascular accident) (HCC) 08/07/2020   Acute upper respiratory infection 11/27/2014   Numbness of finger 09/19/2011   Hilar adenopathy    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HCC) 09/05/2010   Type 2 diabetes mellitus with complication, without long-term current use of insulin (HCC) 04/23/2010   Renal insufficiency  03/21/2008   VISUAL ACUITY, DECREASED, LEFT EYE 03/19/2008   BELL'S PALSY, LEFT 01/30/2008   Morbid obesity (HCC) 01/20/2008   Malignant hypertension with heart failure and stage 3 chronic kidney disease (HCC) 01/20/2008   Past Medical History:  Diagnosis Date   CKD (chronic kidney disease) stage 3, GFR 30-59 ml/min (HCC)    baseline Cr 1.5-1.7   Decreased visual acuity    Left eye, resolved - hypertensive retinopathy   Diabetes mellitus 2012   Type II   Hilar adenopathy    on CT scan 02/2010, on rpt scan stable/improved.   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   Systolic murmur    Vitamin D deficiency     Family History  Problem Relation Age of Onset   Hypertension Mother    Hypertension Father    Diabetes Father    Diabetes Sister    Hypertension Brother    Kidney disease Paternal Grandmother        ESRD   Coronary artery disease Neg Hx    Stroke Neg Hx    Cancer Neg Hx     Past Surgical History:  Procedure Laterality Date   AMPUTATION Right 09/17/2021   Procedure: First ray amputation;  Surgeon: Nadara Mustard, MD;  Location: [facility] OR;  Service: Orthopedics;  Laterality: Right;   APPLICATION OF WOUND VAC Right 10/10/2021   Procedure: APPLICATION OF WOUND VAC;  Surgeon: Nadara Mustard, MD;  Location: [facility] OR;  Service: Orthopedics;  Laterality: Right;   ATRIAL FIBRILLATION ABLATION N/A 10/02/2022   Procedure: ATRIAL FIBRILLATION ABLATION;  Surgeon: Regan Lemming, MD;  Location: [facility] INVASIVE CV LAB;  Service: Cardiovascular;  Laterality: N/A;   CARDIOVERSION N/A 01/31/2021   Procedure: CARDIOVERSION;  Surgeon: Dolores Patty, MD;  Location: [facility] ENDOSCOPY;  Service:  Cardiovascular;  Laterality: N/A;   CARDIOVERSION N/A 07/04/2021   Procedure: CARDIOVERSION;  Surgeon: Dolores Patty, MD;  Location: [facility] ENDOSCOPY;  Service: Cardiovascular;  Laterality: N/A;   hospitalization  11/2008   malignant HTN, nl SPEP/UPEP, neg ANCA panel, nl C3/4, neg anti GBM Ab, neg Hep A/B/C, nl renal US, nl PTH, neg HIV   I & D EXTREMITY Right 10/10/2021   Procedure: RIGHT FOOT DEBRIDEMENT;  Surgeon: Nadara Mustard,  MD;  Location: [facility] OR;  Service: Orthopedics;  Laterality: Right;   RIGHT/LEFT HEART CATH AND CORONARY ANGIOGRAPHY N/A 01/29/2021   Procedure: RIGHT/LEFT HEART CATH AND CORONARY ANGIOGRAPHY;  Surgeon: Dolores Patty, MD;  Location: [facility] INVASIVE CV LAB;  Service: Cardiovascular;  Laterality: N/A;   TEE WITHOUT CARDIOVERSION N/A 01/31/2021   Procedure: TRANSESOPHAGEAL ECHOCARDIOGRAM (TEE);  Surgeon: Dolores Patty, MD;  Location: [facility] ENDOSCOPY;  Service: Cardiovascular;  Laterality: N/A;   TEE WITHOUT CARDIOVERSION N/A 10/02/2022   Procedure: TRANSESOPHAGEAL ECHOCARDIOGRAM;  Surgeon: Regan Lemming, MD;  Location: [facility] INVASIVE CV LAB;  Service: Cardiovascular;  Laterality: N/A;   US ECHOCARDIOGRAPHY  11/2008   LVsys fxn EF 50%, mild MR, normal LV size, neg ANA, neg cryoglobulins   US ECHOCARDIOGRAPHY  2011   severe LVH, EF 40%, LA mildly dilated, PA pressure moderately increased   Social History   Occupational History   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

## 2023-03-29 NOTE — Telephone Encounter (Signed)
*  STAT* If patient is at the pharmacy, call can be transferred to refill team.   1. Which medications need to be refilled? (please list name of each medication and dose if known) furosemide (LASIX) 20 MG tablet   2. Which pharmacy/location (including street and city if local pharmacy) is medication to be sent to? [place] [facility]   3. Do they need a 30 day or 90 day supply? 90

## 2023-03-30 ENCOUNTER — Other Ambulatory Visit (HOSPITAL_COMMUNITY): Payer: Self-pay

## 2023-04-07 ENCOUNTER — Other Ambulatory Visit (HOSPITAL_COMMUNITY): Payer: Self-pay

## 2023-04-07 ENCOUNTER — Encounter (HOSPITAL_COMMUNITY): Payer: Self-pay

## 2023-04-07 ENCOUNTER — Ambulatory Visit (HOSPITAL_COMMUNITY)
Admission: RE | Admit: 2023-04-07 | Discharge: 2023-04-07 | Disposition: A | Discharge status: Home / Self Care | Payer: 59 | Source: Ambulatory Visit | Attending: Family Medicine | Admitting: Family Medicine

## 2023-04-07 VITALS — BP 146/100 | HR 93 | Wt 360.6 lb

## 2023-04-07 DIAGNOSIS — I2510 Atherosclerotic heart disease of native coronary artery without angina pectoris: Secondary | ICD-10-CM | POA: Insufficient documentation

## 2023-04-07 DIAGNOSIS — I480 Paroxysmal atrial fibrillation: Secondary | ICD-10-CM | POA: Diagnosis present

## 2023-04-07 DIAGNOSIS — H35039 Hypertensive retinopathy, unspecified eye: Secondary | ICD-10-CM | POA: Diagnosis not present

## 2023-04-07 DIAGNOSIS — Z89411 Acquired absence of right great toe: Secondary | ICD-10-CM | POA: Insufficient documentation

## 2023-04-07 DIAGNOSIS — I10 Essential (primary) hypertension: Secondary | ICD-10-CM

## 2023-04-07 DIAGNOSIS — E669 Obesity, unspecified: Secondary | ICD-10-CM | POA: Insufficient documentation

## 2023-04-07 DIAGNOSIS — I5032 Chronic diastolic (congestive) heart failure: Secondary | ICD-10-CM | POA: Diagnosis present

## 2023-04-07 DIAGNOSIS — I5022 Chronic systolic (congestive) heart failure: Secondary | ICD-10-CM | POA: Insufficient documentation

## 2023-04-07 DIAGNOSIS — R9431 Abnormal electrocardiogram [ECG] [EKG]: Secondary | ICD-10-CM | POA: Diagnosis not present

## 2023-04-07 DIAGNOSIS — N1832 Chronic kidney disease, stage 3b: Secondary | ICD-10-CM

## 2023-04-07 DIAGNOSIS — I428 Other cardiomyopathies: Secondary | ICD-10-CM | POA: Diagnosis not present

## 2023-04-07 DIAGNOSIS — G4733 Obstructive sleep apnea (adult) (pediatric): Secondary | ICD-10-CM | POA: Insufficient documentation

## 2023-04-07 DIAGNOSIS — I110 Hypertensive heart disease with heart failure: Secondary | ICD-10-CM | POA: Diagnosis present

## 2023-04-07 DIAGNOSIS — I4821 Permanent atrial fibrillation: Secondary | ICD-10-CM

## 2023-04-07 DIAGNOSIS — Z7901 Long term (current) use of anticoagulants: Secondary | ICD-10-CM | POA: Diagnosis not present

## 2023-04-07 DIAGNOSIS — E1122 Type 2 diabetes mellitus with diabetic chronic kidney disease: Secondary | ICD-10-CM | POA: Insufficient documentation

## 2023-04-07 DIAGNOSIS — E785 Hyperlipidemia, unspecified: Secondary | ICD-10-CM | POA: Insufficient documentation

## 2023-04-07 DIAGNOSIS — I130 Hypertensive heart and chronic kidney disease with heart failure and stage 1 through stage 4 chronic kidney disease, or unspecified chronic kidney disease: Secondary | ICD-10-CM | POA: Diagnosis not present

## 2023-04-07 DIAGNOSIS — E0590 Thyrotoxicosis, unspecified without thyrotoxic crisis or storm: Secondary | ICD-10-CM | POA: Insufficient documentation

## 2023-04-07 DIAGNOSIS — Z8673 Personal history of transient ischemic attack (TIA), and cerebral infarction without residual deficits: Secondary | ICD-10-CM | POA: Diagnosis not present

## 2023-04-07 LAB — COMPREHENSIVE METABOLIC PANEL
ALT: 20 U/L (ref 0–44)
AST: 21 U/L (ref 15–41)
Albumin: 3.3 g/dL — ABNORMAL LOW (ref 3.5–5.0)
Alkaline Phosphatase: 66 U/L (ref 38–126)
Anion gap: 9 (ref 5–15)
BUN: 20 mg/dL (ref 6–20)
CO2: 21 mmol/L — ABNORMAL LOW (ref 22–32)
Calcium: 9.1 mg/dL (ref 8.9–10.3)
Chloride: 109 mmol/L (ref 98–111)
Creatinine, Ser: 1.9 mg/dL — ABNORMAL HIGH (ref 0.61–1.24)
GFR, Estimated: 42 mL/min — ABNORMAL LOW (ref >60)
Glucose, Bld: 103 mg/dL — ABNORMAL HIGH (ref 70–99)
Potassium: 3.8 mmol/L (ref 3.5–5.1)
Sodium: 139 mmol/L (ref 135–145)
Total Bilirubin: 0.6 mg/dL (ref <1.2)
Total Protein: 7.8 g/dL (ref 6.5–8.1)

## 2023-04-07 LAB — BRAIN NATRIURETIC PEPTIDE: B Natriuretic Peptide: 323.5 pg/mL — ABNORMAL HIGH (ref 0.0–100.0)

## 2023-04-07 MED ORDER — ISOSORB DINITRATE-HYDRALAZINE 20-37.5 MG PO TABS
2.0000 | ORAL_TABLET | Freq: Three times a day (TID) | ORAL | 3 refills | Status: DC
  Filled 2023-04-07: qty 180, 30d supply, fill #0
  Filled 2023-04-14: qty 540, 90d supply, fill #0
  Filled 2023-04-26 – 2023-06-03 (×2): qty 180, 30d supply, fill #0
  Filled 2023-06-12 – 2023-07-06 (×3): qty 180, 30d supply, fill #1
  Filled 2023-08-03: qty 180, 30d supply, fill #2
  Filled 2023-09-05: qty 180, 30d supply, fill #3
  Filled 2023-10-02: qty 180, 30d supply, fill #4
  Filled 2023-10-14 – 2023-11-02 (×2): qty 180, 30d supply, fill #5
  Filled 2023-11-30: qty 180, 30d supply, fill #6
  Filled 2023-12-25: qty 180, 30d supply, fill #7
  Filled 2024-01-13 – 2024-01-17 (×2): qty 180, 30d supply, fill #8
  Filled 2024-01-27: qty 180, 30d supply, fill #0
  Filled 2024-02-23: qty 180, 30d supply, fill #1
  Filled 2024-03-26: qty 180, 30d supply, fill #2

## 2023-04-07 MED ORDER — APIXABAN 5 MG PO TABS
5.0000 mg | ORAL_TABLET | Freq: Two times a day (BID) | ORAL | 3 refills | Status: DC
  Filled 2023-04-07: qty 60, 30d supply, fill #0
  Filled 2023-04-14: qty 180, 90d supply, fill #0
  Filled 2023-04-26: qty 60, 30d supply, fill #0
  Filled 2023-06-03: qty 60, 30d supply, fill #1
  Filled 2023-06-12 – 2023-07-19 (×5): qty 60, 30d supply, fill #2
  Filled 2023-08-13: qty 60, 30d supply, fill #3
  Filled 2023-09-09: qty 60, 30d supply, fill #4
  Filled 2023-10-14: qty 60, 30d supply, fill #5
  Filled 2023-11-02 – 2023-11-09 (×2): qty 60, 30d supply, fill #6
  Filled 2023-11-30 – 2023-12-10 (×2): qty 60, 30d supply, fill #7
  Filled 2023-12-25 – 2024-01-08 (×2): qty 60, 30d supply, fill #8
  Filled 2024-01-13: qty 60, 30d supply, fill #0
  Filled 2024-01-13: qty 60, 30d supply, fill #8
  Filled 2024-01-19 – 2024-02-07 (×3): qty 60, 30d supply, fill #1
  Filled 2024-03-19 – 2024-03-21 (×2): qty 60, 30d supply, fill #2

## 2023-04-07 MED ORDER — LOSARTAN POTASSIUM 25 MG PO TABS
25.0000 mg | ORAL_TABLET | Freq: Every day | ORAL | 3 refills | Status: DC
  Filled 2023-04-07: qty 30, 30d supply, fill #0
  Filled 2023-04-14 – 2023-05-06 (×2): qty 30, 30d supply, fill #1
  Filled 2023-06-03: qty 30, 30d supply, fill #2
  Filled 2023-06-12 – 2023-07-06 (×2): qty 30, 30d supply, fill #3

## 2023-04-07 MED ORDER — FUROSEMIDE 20 MG PO TABS
20.0000 mg | ORAL_TABLET | ORAL | 3 refills | Status: DC | PRN
  Filled 2023-04-07 – 2023-04-15 (×5): qty 30, 30d supply, fill #0
  Filled 2023-05-06 – 2023-06-08 (×2): qty 30, 30d supply, fill #1

## 2023-04-07 MED ORDER — EMPAGLIFLOZIN 10 MG PO TABS
10.0000 mg | ORAL_TABLET | Freq: Every day | ORAL | 3 refills | Status: DC
  Filled 2023-04-07: qty 30, 30d supply, fill #0
  Filled 2023-04-14 – 2023-04-26 (×2): qty 90, 90d supply, fill #0

## 2023-04-07 NOTE — Patient Instructions (Addendum)
Labs done today. We will contact you only if your labs are abnormal.  START Losartan 25mg  (1 tablet) by mouth daily.   No other medication changes were made. Please continue all current medications as prescribed.  You have been referred to [facility]. They will contact you to schedule an appointment.  Your physician recommends that you schedule a follow-up appointment in: 2 weeks for a lab only appointment and in 4 weeks with our NP/PA Clinic here in our office.  PLEASE FOLLOW UP WITH DR. Elberta Fortis. CALL HIS OFFICE AT [phone]  If you have any questions or concerns before your next appointment please send [place] a message through [place] or call our office at [phone].    TO LEAVE A MESSAGE FOR THE NURSE SELECT OPTION 2, PLEASE LEAVE A MESSAGE INCLUDING: YOUR NAME DATE OF BIRTH CALL BACK NUMBER REASON FOR CALL**this is important as we prioritize the call backs  YOU WILL RECEIVE A CALL BACK THE SAME DAY AS LONG AS YOU CALL BEFORE 4:00 PM   Do the following things EVERYDAY: Weigh yourself in the morning before breakfast. Write it down and keep it in a log. Take your medicines as prescribed Eat low salt foods--Limit salt (sodium) to 2000 mg per day.  Stay as active as you can everyday Limit all fluids for the day to less than 2 liters   At the [facility], you and your health needs are our priority. As part of our continuing mission to provide you with exceptional heart care, we have created designated Provider Care Teams. These Care Teams include your primary Cardiologist (physician) and Advanced Practice Providers (APPs- Physician Assistants and Nurse Practitioners) who all work together to provide you with the care you need, when you need it.   You may see any of the following providers on your designated Care Team at your next follow up: Dr Arvilla Meres Dr Marca Ancona Dr. Marcos Eke, NP Robbie Lis, [place] [facility] [place], [place] Brynda Peon, NP Karle Plumber, PharmD   Please be sure to bring in all your medications bottles to every appointment.    Thank you for choosing [facility]

## 2023-04-07 NOTE — Progress Notes (Addendum)
b  Advanced [facility] Clinic Note  PCP: [facility]  Cardiologist: Dr. Gala Romney  HPI: Randy [name] is a 52 year old with a history of HFrEF, paroxsymal A fib/atrial flutter s/p ablation with PVI w/ box lesion, HTN, hypertensive retinopathy, CKD Stage IIIa, hyperlipidemia, DMII, Bells palsy, CVA, and OSA.    Admitted in 12/02/2019 with newly diagnosed A fib + ischemic CVA. He did not meet criteria for IV tPA. MRI showed large acute focal infarct, left frontal lobe predominantly ACA distribution. CT angiogram of the head and neck performed revealing no significant carotid, vertebral or intracranial stenosis. Echocardiogram obtained with bubble study showing no cardiac etiology for stroke. LVEF was 40 to 45%.   Admitted 10/22 with A/C HFrEF and A fib RVR.  Echo showed EF down to 30-35% with speckled appearance concerning for amyloid. Once diuresed and on Iv amio, had TEE-DC-CV with restoration of NSR. RHC/LHC with min obs CAD, elevated filling pressures and normal cardiac output.  Diuresed 40 pounds. Discharge weight 367 pound.  Echo 3/23 EF 55% severe LVH.  Admitted 5/23 with osteomyelitis R great toe and cellulitis RLE. S/p R first ray amputation + placement wound VAC. Wound later dehisced and underwent surgical debridement 6/23.   Amiodarone stopped in 8/23 due to hyperthyroidism. Started on methimazole and prednisone.    He underwent Afib/flutter ablation with PVI/box lesion ablation in 06/24. TEE at that time with EF 25-30% in setting of AF with RVR.  In AFL with RVR 08/24. He was seen in ED and EP consulted. He was off metoprolol. Toprol Xl restarted.   Echo 01/15/23: EF 50-55%, severe LVH, RV mildly reduced, severe LAE  He was readmitted 09/24 with sepssis secondary to RLE cellulitis. He remained in Afib/flutter with RVR. Seen by EP, beta blocker increased and cardizem added. Plan for repeat ablation as outpatient. Unfortunately, he was admitted again in 10/24 d/t RLE cellulitis  with abscess. He was treated with IV abx and did not require surgical intervention.  Here today for overdue follow-up. Denies dyspnea, orthopnea or PND. Notes a little bit of lower extremity edema. Took 1 dose of lasix over the weekend with some improvement. He feels that he needs more diuretic but wasn't sure if it was okay for his kidneys. Right leg is more swollen than left, reports this has been chronic after recent right lower extremity infections. He states Dr. Lajoyce Corners instructed him to wear a compression stocking every day. Had venous duplex duplex 09/24 with no evidence of DVT. Taking all medications as prescribed. BP tends to average 140s/80s.  Works full-time as a Dealer for [employer].   Cardiac Testing - Echo (3/23): EF 55%, severe LVH  - Echo (10/22): EF 30-35% specked appearance concerning for amyloid. RV normal. LA severely dilated. Severe LVH.   - Echo (8/21): EF 40-45% and severe LVH   - R/LHC (10/22)  Ao = 163/108 (134) LV = 136/25 RA =  15 RV = 51/13 PA = 57/19 (39) PCW = 28 Fick cardiac output/index = 9.2/3.0 PVR = 1.2 WU Ao sat = 94% PA sat = 65%, 65%    1. Mild non-obstructive CAD 2. NICM (likely hypertensive) with EF 35% by echo 3. Elevated filling pressures with normal CO 4. Severe systemic HTN  ROS: All systems negative except as listed in HPI, PMH and Problem List.  SH:  Social History   Socioeconomic History   Marital status: Married    Spouse name: Randy [name]   Number of  children: 3   Years of education: Not on file   Highest education level: Bachelor's degree (e.g., BA, AB, BS)  Occupational History   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    Comment: [place]  Tobacco Use   Smoking status: Former    Types: Cigars   Smokeless tobacco: Never   Tobacco comments:    Former smoker 01/01/23  Vaping Use   Vaping status: Never Used  Substance and Sexual Activity   Alcohol use: Not Currently    Comment: stop drinking 01/01/23   Drug  use: No   Sexual activity: Not on file  Other Topics Concern   Not on file  Social History Narrative   Newly married, 2012, 1 daughter, 1 son   No injectable steroid cycles   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, 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)   Regular exercise-yes   Social Determinants of Health   Financial Resource Strain: Low Risk  (01/27/2021)   Overall Financial Resource Strain (CARDIA)    Difficulty of Paying Living Expenses: Not hard at all  Food Insecurity: No Food Insecurity (02/11/2023)   Hunger Vital Sign    Worried About Running Out of Food in the Last Year: Never true    Ran Out of Food in the Last Year: Never true  Transportation Needs: No Transportation Needs (02/11/2023)   PRAPARE - Administrator, Civil Service (Medical): No    Lack of Transportation (Non-Medical): No  Physical Activity: Not on file  Stress: Not on file  Social Connections: Unknown (09/06/2021)   [facility]   Social Network    Social Network: Not on file  Intimate Partner Violence: Not At Risk (02/11/2023)   Humiliation, Afraid, Rape, and Kick questionnaire    Fear of Current or Ex-Partner: No    Emotionally Abused: No    Physically Abused: No    Sexually Abused: No   FH:  Family History  Problem Relation Age of Onset   Hypertension Mother    Hypertension Father    Diabetes Father    Diabetes Sister    Hypertension Brother    Kidney disease Paternal Grandmother        ESRD   Coronary artery disease Neg Hx    Stroke Neg Hx    Cancer Neg Hx    Past Medical History:  Diagnosis Date   CKD (chronic kidney disease) stage 3, GFR 30-59 ml/min (HCC)    baseline Cr 1.5-1.7   Decreased visual acuity    Left eye, resolved - hypertensive retinopathy   Diabetes mellitus 2012   Type II   Hilar adenopathy    on CT scan 02/2010, on rpt scan stable/improved.   History of Bell's palsy 12/2007   history, Left   History of headache    HTN  (hypertension), malignant    previously on BC and goody powders for HA   Internal derangement of knee 12/2007   Left   Microalbuminuria    Morbid obesity (HCC)    Stroke (HCC)    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HCC)    echo 2011 with nonischemic hypertensive cardiomyopathy   Systolic murmur    Vitamin D deficiency    Current Outpatient Medications  Medication Sig Dispense Refill   atorvastatin (LIPITOR) 20 MG tablet TAKE 1 TABLET BY MOUTH AT BEDTIME (NEEDS  FOLLOW  UP  APPOINTMENT  FOR  MORE  REFILLS) 30 tablet 0   Continuous Blood Gluc Sensor (FREESTYLE LIBRE 3 SENSOR) MISC  1 each by Does not apply route every 14 (fourteen) days. 6 each 3   diltiazem (CARDIZEM CD) 360 MG 24 hr capsule Take 1 capsule (360 mg total) by mouth daily. 30 capsule 3   ELIQUIS 5 MG TABS tablet Take 1 tablet by mouth twice daily 60 tablet 0   furosemide (LASIX) 20 MG tablet TAKE 1 TABLET BY MOUTH ONCE DAILY AS NEEDED FOR FLUID OR  EDEMA 30 tablet 0   isosorbide-hydrALAZINE (BIDIL) 20-37.5 MG tablet Take 2 tablets by mouth 3 (three) times daily. 180 tablet 5   JARDIANCE 10 MG TABS tablet Take 1 tablet by mouth once daily 90 tablet 0   metoprolol succinate (TOPROL-XL) 100 MG 24 hr tablet Take 1 tablet (100 mg total) by mouth at bedtime. Take with or immediately following a meal. 30 tablet 6   oxyCODONE (OXY IR/ROXICODONE) 5 MG immediate release tablet Take 1 tablet (5 mg total) by mouth every 6 (six) hours as needed. 30 tablet 0   tirzepatide (MOUNJARO) 12.5 MG/0.5ML Pen Inject 12.5 mg into the skin once a week. 6 mL 3   No current facility-administered medications for this visit.   There were no vitals taken for this visit.  Wt Readings from Last 3 Encounters:  02/16/23 (!) 163.4 kg (360 lb 3.7 oz)  01/22/23 (!) 174.7 kg (385 lb 2.3 oz)  01/01/23 (!) 170.3 kg (375 lb 6.4 oz)   PHYSICAL EXAM: General:  Well appearing. Ambulated into clinic HEENT: normal Neck: supple. JVP difficult d/t thick neck.  Cor: PMI nondisplaced.  Irregularly irregular rhythm. No rubs, gallops or murmurs. Lungs: clear Abdomen: obese, soft, nontender, nondistended.  Extremities: no cyanosis, clubbing, rash, 2+ RLE edema, 1+ LLE edema  Neuro: alert & orientedx3. moves all 4 extremities w/o difficulty. Affect pleasant    ECG (personally reviewed): AF 98 bpm   ASSESSMENT & PLAN:   1. Chronic Heart Failure, HFrEF>>HFimEF NICM - NICM. LHC w/ mild nonobstructive CAD. Suspect HTN cardiomyopathy.  - Echo 01/31/21 EF 30-35% with severe LVH. Doubt amyloid with uncontrolled hypertension however if EF drops again will need cMRI if he fits - Echo 3/23 EF 55-60% (recovered EF) - Echo 04/03/22 EF 55-60% - TEE 06/24: EF 25-30% in setting of AF with RVR - Afib/flutter ablation 06/24 - Echo 09/24: EF 50-55%, severe LVH, RV mildly reduced - NYHA II. Volume up in setting of AF.  Has been taking Lasix PRN, recommended he take 20 mg lasix daily for next few days until edema resolved. He will call if volume status not improving. Wear compression stockings. Check CMET and BNP today, may need K supplement. - Continue Jardiance 10 mg daily.  - Continue Bidil 2 tab tid.  - Continue Toprol XL 100 mg daily. Cleda Daub and entresto stopped during recent admit. EF has recovered. Add Losartan 25 mg daily. Could eventually switch back to entresto. -BMET 2 weeks   2. PAF/AFL: - S/P TEE/DC-CV (10/22) with restoration NSR  - Recurrent AF 3/23 s/p DCCV - Back in AF after amio stopped in 8/23 due to hyperthyroidism  - Afib/flutter ablation 06/24 - Recurrent Fib/flutter 08/24. Plan to consider flutter ablation per Dr. Elberta Fortis. Missed last appointment with Afib clinic.  Refer back to EP. - Continue Eliquis 5 mg bid. - Encouraged use of CPAP   3. Hypertension  - BP elevated - Some of his medications were stopped this fall during admissions with sepsis. Adding meds back as above.   4. CKD Stage IIIb  - Baseline  SCr 1.7-2.0 - Referred to Nephrology - Labs  today.   5. DMII - Last A1c 6.5% - Continue Jardiance and Mounjaro - Sees Endocrinology.    6. OSA - On CPAP.    7. CAD - Mild nonobstructive on cath - No chest pain.  - c/w statin, LDL Goal < 70.  - No ASA w/ Eliquis.   8. S/p R great toe amputation - s/p R LE Osteomyelitis/R lower leg cellulitis, required Wound VAC 5/23. - 2 recent admissions with R lower extremity cellulitis/sepsis. Resolved.  9. Obesity - There is no height or weight on file to calculate BMI. - Now on Mounjaro   10. Hyperthyroidism - felt to be due to amio - Amio stopped - Methimazole and prednisone started 12/23, now off - Sees Dr. Lafe Garin  Follow-up: 4 weeks to assess volume and titrate GDMT. Has been referred back to EP for Afib/flutter.   Aundrea Horace N, PA-C  6:59 AM

## 2023-04-09 ENCOUNTER — Other Ambulatory Visit (HOSPITAL_COMMUNITY): Payer: Self-pay

## 2023-04-13 ENCOUNTER — Other Ambulatory Visit (HOSPITAL_COMMUNITY): Payer: Self-pay

## 2023-04-14 ENCOUNTER — Other Ambulatory Visit (HOSPITAL_COMMUNITY): Payer: Self-pay

## 2023-04-14 ENCOUNTER — Other Ambulatory Visit: Payer: Self-pay

## 2023-04-15 ENCOUNTER — Other Ambulatory Visit (HOSPITAL_COMMUNITY): Payer: Self-pay

## 2023-04-24 ENCOUNTER — Other Ambulatory Visit (HOSPITAL_COMMUNITY): Payer: Self-pay | Admitting: Adult Health

## 2023-04-26 ENCOUNTER — Ambulatory Visit (HOSPITAL_COMMUNITY)
Admission: RE | Admit: 2023-04-26 | Discharge: 2023-04-26 | Disposition: A | Discharge status: Home / Self Care | Payer: 59 | Source: Ambulatory Visit | Attending: Cardiology | Admitting: Cardiology

## 2023-04-26 DIAGNOSIS — I5032 Chronic diastolic (congestive) heart failure: Secondary | ICD-10-CM | POA: Insufficient documentation

## 2023-04-26 LAB — BASIC METABOLIC PANEL
Anion gap: 10 (ref 5–15)
BUN: 23 mg/dL — ABNORMAL HIGH (ref 6–20)
CO2: 22 mmol/L (ref 22–32)
Calcium: 9.2 mg/dL (ref 8.9–10.3)
Chloride: 109 mmol/L (ref 98–111)
Creatinine, Ser: 1.95 mg/dL — ABNORMAL HIGH (ref 0.61–1.24)
GFR, Estimated: 41 mL/min — ABNORMAL LOW (ref >60)
Glucose, Bld: 91 mg/dL (ref 70–99)
Potassium: 3.8 mmol/L (ref 3.5–5.1)
Sodium: 141 mmol/L (ref 135–145)

## 2023-04-27 ENCOUNTER — Other Ambulatory Visit (HOSPITAL_COMMUNITY): Payer: Self-pay

## 2023-04-27 ENCOUNTER — Other Ambulatory Visit: Payer: Self-pay

## 2023-05-02 ENCOUNTER — Other Ambulatory Visit (HOSPITAL_COMMUNITY): Payer: Self-pay | Admitting: Internal Medicine

## 2023-05-06 ENCOUNTER — Other Ambulatory Visit: Payer: Self-pay

## 2023-05-06 ENCOUNTER — Other Ambulatory Visit (HOSPITAL_COMMUNITY): Payer: Self-pay | Admitting: Internal Medicine

## 2023-05-06 ENCOUNTER — Other Ambulatory Visit (HOSPITAL_COMMUNITY): Payer: Self-pay

## 2023-05-07 ENCOUNTER — Encounter (HOSPITAL_COMMUNITY): Payer: 59

## 2023-05-07 ENCOUNTER — Other Ambulatory Visit (HOSPITAL_COMMUNITY): Payer: Self-pay

## 2023-05-07 MED ORDER — FUROSEMIDE 20 MG PO TABS
20.0000 mg | ORAL_TABLET | Freq: Every day | ORAL | 5 refills | Status: DC | PRN
  Filled 2023-05-07 – 2023-05-10 (×3): qty 30, 30d supply, fill #0

## 2023-05-10 ENCOUNTER — Other Ambulatory Visit (HOSPITAL_COMMUNITY): Payer: Self-pay

## 2023-05-13 ENCOUNTER — Ambulatory Visit: Payer: 59 | Admitting: Internal Medicine

## 2023-05-13 ENCOUNTER — Encounter: Payer: Self-pay | Admitting: Internal Medicine

## 2023-05-13 VITALS — BP 150/92 | HR 88 | Ht >= 80 in | Wt 371.0 lb

## 2023-05-13 DIAGNOSIS — T462X5A Adverse effect of other antidysrhythmic drugs, initial encounter: Secondary | ICD-10-CM | POA: Diagnosis not present

## 2023-05-13 DIAGNOSIS — E0580 Other thyrotoxicosis without thyrotoxic crisis or storm: Secondary | ICD-10-CM

## 2023-05-13 DIAGNOSIS — Z7984 Long term (current) use of oral hypoglycemic drugs: Secondary | ICD-10-CM

## 2023-05-13 DIAGNOSIS — Z7985 Long-term (current) use of injectable non-insulin antidiabetic drugs: Secondary | ICD-10-CM | POA: Diagnosis not present

## 2023-05-13 DIAGNOSIS — E118 Type 2 diabetes mellitus with unspecified complications: Secondary | ICD-10-CM

## 2023-05-13 LAB — POCT GLYCOSYLATED HEMOGLOBIN (HGB A1C): Hemoglobin A1C: 5.4 % (ref 4.0–5.6)

## 2023-05-13 NOTE — Progress Notes (Addendum)
Patient ID: Randy [name], male   DOB: Aug 16, 1970, 53 y.o.   MRN: [id number]  HPI: Randy [name] is a 53 y.o.-year-old male, initially referred by his cardiologist, Dr. 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  Last visit 4.5 months ago.  Interim history: No increased urination, blurry vision, nausea, chest pain, SOB. At last visit, he had a R foot ulcer for which he was seeing orthopedics - healing.  However, he developed cellulitis and was also admitted for sepsis in 12/2022.  He mentions that he was in the hospital for a month.  Now healed. He feels he is retaining fluid. He has appointment with nephrology in 4 days.  She also has an appointment coming up with cardiology.  DM2: Reviewed his HbA1c levels: Lab Results  Component Value Date   HGBA1C 6.5 (H) 02/11/2023   HGBA1C 5.8 (A) 12/22/2022   HGBA1C 6.4 (A) 08/12/2022   HGBA1C 5.9 (A) 05/06/2022   HGBA1C 10.1 (H) 09/14/2021   HGBA1C 6.3 (H) 01/27/2021   HGBA1C 7.4 (H) 08/08/2020   HGBA1C 12.5 (H) 11/28/2014   HGBA1C 12.5 (H) 11/10/2013   HGBA1C 6.6 (H) 04/03/2011   He is on a regimen of: - Jardiance 10 mg before breakfast - Mounjaro 15 mg weekly - started 11/2021 >> 12.5 mg weekly Metformin stopped 2/2 CKD, AP. He has not been on insulin.  Pt checks his sugars 0-1x a day: - am: 60s-70s >> 60s (fasting now - religious), 120-130 >> 109-120 >> 80, 85-90s - 2h after b'fast: n/c - before lunch: n/c - 2h after lunch: 110-120 >> n/c - before dinner: n/c - 2h after dinner: n/c >> 160-175 >> 160s usually, 180 (cake) >> 140s-160 - bedtime: n/c - nighttime: n/c Lowest sugar was 40 >> 70 >> 90s >> 80; he has hypoglycemia awareness at 40.  Highest sugar was 120 >> 170 >> 180 >> 160.  Glucometer:One Touch  Pt's meals are: -  Breakfast: steak biscuit  - Lunch: eating out - Dinner: skips, or veggies - Snacks: used to drink gatorade, not anymore  - + CKD, last BUN/creatinine:  Lab Results  Component Value Date   BUN 23 (H) 04/26/2023   BUN 20 04/07/2023   CREATININE 1.95 (H) 04/26/2023   CREATININE 1.90 (H) 04/07/2023   Lab Results  Component Value Date   MICRALBCREAT 128.9 (H) 12/22/2022   MICRALBCREAT 33.5 (H) 11/10/2013   MICRALBCREAT 81.8 (H) 05/29/2011  He is on Entresto.  - + HL; last set of lipids: Lab Results  Component Value Date   CHOL 107 12/22/2022   HDL 48.70 12/22/2022   LDLCALC 48 12/22/2022   LDLDIRECT 79.0 11/28/2014   TRIG 49.0 12/22/2022   CHOLHDL 2 12/22/2022  He is on Lipitor 20 mg daily.  - last eye exam was in 11/2022. No DR reportedly. IOP is elevated, stable. [facility].  - no numbness and tingling in his feet. Last foot exam 03/24/2022: [name].  Pt has FH of DM - brother, sister, father.  Amiodarone-induced thyrotoxicosis:  Amiodarone was stopped 11/2021 due to thyrotoxicosis.  04/15/2022: following regimen started by Dr. Gala Romney: - Methimazole 5 mg twice a day - Prednisone 20 mg daily  04/2022: We decreased the doses: - Methimazole 5 mg once a day - Prednisone 10 mg daily  07/2022:  - stopped MMI - Reduced prednisone to  5 mg daily  09/2022: - Continued prednisone 5 mg daily as his fT4 was slightly elevated However, he stopped as he run out of the Rx 10/2022.  Reviewed TFTs: Lab Results  Component Value Date   TSH 1.08 12/22/2022   TSH 0.78 10/08/2022   TSH 0.88 08/12/2022   TSH 0.03 (L) 05/06/2022   TSH 0.010 (L) 04/22/2022   TSH <0.010 (L) 03/25/2022   TSH <0.010 (L) 02/03/2022   TSH 0.103 (L) 11/21/2021   TSH 0.90 09/05/2010   TSH 1.345 03/13/2010   Lab Results  Component Value Date   FREET4 1.39 12/22/2022   FREET4 1.72 (H) 10/08/2022   FREET4 1.51 08/12/2022   FREET4 1.83 (H) 05/06/2022   FREET4 2.08 (H) 04/22/2022   FREET4  2.89 (H) 03/25/2022   FREET4 2.50 (H) 02/03/2022   FREET4 2.20 (H) 12/05/2021   Lab Results  Component Value Date   T3FREE 3.4 12/22/2022   T3FREE 2.8 10/08/2022   T3FREE 3.1 08/12/2022   T3FREE 3.8 05/06/2022   T3FREE 2.3 04/22/2022   T3FREE 3.4 03/25/2022   T3FREE 2.0 02/03/2022   T3FREE 2.6 12/05/2021   Lab Results  Component Value Date   TSI <89 05/06/2022   No FH of thyroid cancer. (Wife has Graves ds). No h/o radiation tx to head or neck. No recent contrast studies. No herbal supplements. No Biotin use.   CTA neck (07/28/2020): Chronic moderate diffuse enlargement of the thyroid gland without significant heterogeneity or a discrete nodule and for which no imaging follow-up is recommended.  Pt denies: - feeling nodules in neck - hoarseness - dysphagia - choking  She also has a history of significant HTN, obesity- 450 lbs at his highest weight. He had a cardiac ablation in 09/2022.  ROS: + See HPI  Past Medical History:  Diagnosis Date   CKD (chronic kidney disease) stage 3, GFR 30-59 ml/min (HCC)    baseline Cr 1.5-1.7   Decreased visual acuity    Left eye, resolved - hypertensive retinopathy   Diabetes mellitus 2012   Type II   Hilar adenopathy    on CT scan 02/2010, on rpt scan stable/improved.   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   Systolic murmur    Vitamin D deficiency    Past Surgical History:  Procedure Laterality Date   AMPUTATION Right 09/17/2021   Procedure: First ray amputation;  Surgeon: Nadara Mustard, MD;  Location: [facility] OR;  Service: Orthopedics;  Laterality: Right;   APPLICATION OF WOUND VAC Right 10/10/2021   Procedure: APPLICATION OF  WOUND VAC;  Surgeon: Nadara Mustard, MD;  Location: [facility] OR;  Service: Orthopedics;  Laterality: Right;   ATRIAL FIBRILLATION ABLATION N/A 10/02/2022   Procedure: ATRIAL FIBRILLATION ABLATION;  Surgeon: Regan Lemming, MD;  Location: [facility] INVASIVE CV LAB;  Service: Cardiovascular;  Laterality: N/A;   CARDIOVERSION N/A 01/31/2021   Procedure: CARDIOVERSION;  Surgeon: Dolores Patty, MD;  Location: [facility] ENDOSCOPY;  Service: Cardiovascular;  Laterality: N/A;   CARDIOVERSION N/A 07/04/2021   Procedure: CARDIOVERSION;  Surgeon: Dolores Patty, MD;  Location: [facility] ENDOSCOPY;  Service: Cardiovascular;  Laterality: N/A;   hospitalization  11/2008   malignant HTN, nl SPEP/UPEP, neg ANCA panel, nl C3/4, neg anti GBM Ab, neg Hep A/B/C, nl renal US, nl PTH, neg HIV   I & D EXTREMITY Right 10/10/2021   Procedure: RIGHT FOOT DEBRIDEMENT;  Surgeon: Nadara Mustard, MD;  Location: [facility] OR;  Service: Orthopedics;  Laterality: Right;   RIGHT/LEFT HEART CATH AND CORONARY ANGIOGRAPHY N/A 01/29/2021   Procedure: RIGHT/LEFT HEART CATH AND CORONARY ANGIOGRAPHY;  Surgeon: Dolores Patty, MD;  Location: [facility] INVASIVE CV LAB;  Service: Cardiovascular;  Laterality: N/A;   TEE WITHOUT CARDIOVERSION N/A 01/31/2021   Procedure: TRANSESOPHAGEAL ECHOCARDIOGRAM (TEE);  Surgeon: Dolores Patty, MD;  Location: [facility] ENDOSCOPY;  Service: Cardiovascular;  Laterality: N/A;   TEE WITHOUT CARDIOVERSION N/A 10/02/2022   Procedure: TRANSESOPHAGEAL ECHOCARDIOGRAM;  Surgeon: Regan Lemming, MD;  Location: [facility] INVASIVE CV LAB;  Service: Cardiovascular;  Laterality: N/A;   US ECHOCARDIOGRAPHY  11/2008   LVsys fxn EF 50%, mild MR, normal LV size, neg ANA, neg cryoglobulins   US ECHOCARDIOGRAPHY  2011   severe LVH, EF 40%, LA mildly dilated, PA pressure moderately increased   Social History   Socioeconomic History   Marital status: Married    Spouse name: Carmeron Gettings   Number of children: 3   Years of education: Not on file    Highest education level: Bachelor's degree (e.g., BA, AB, BS)  Occupational History   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  Other Topics Concern   Not on file  Social History Narrative   Newly married, 2012, 1 daughter, 1 son   No injectable steroid cycles   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, 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)   Regular exercise-yes   Social Drivers of Health   Financial Resource Strain: Low Risk  (01/27/2021)   Overall Financial Resource Strain (CARDIA)    Difficulty of Paying Living Expenses: Not hard at all  Food Insecurity: No Food Insecurity (02/11/2023)   Hunger Vital Sign    Worried About Running Out of Food in the Last Year: Never true    Ran Out of Food in the Last Year: Never true  Transportation Needs: No Transportation Needs (02/11/2023)   PRAPARE - Administrator, Civil Service (Medical): No    Lack of Transportation (Non-Medical): No  Physical Activity: Not on file  Stress: Not on file  Social Connections: Unknown (09/06/2021)   Received from [facility]   Social Network    Social Network: Not on file  Intimate Partner Violence: Not At Risk (02/11/2023)   Humiliation, Afraid, Rape, and Kick questionnaire    Fear of Current or Ex-Partner: No    Emotionally Abused: No    Physically Abused: No    Sexually Abused: No   Current Outpatient Medications on File Prior to Visit  Medication Sig Dispense Refill   apixaban (ELIQUIS) 5 MG TABS tablet Take 1 tablet (5 mg total) by mouth 2 (two) times daily. 180 tablet 3   atorvastatin (LIPITOR) 20 MG tablet TAKE 1 TABLET  BY MOUTH AT  BEDTIME (NEEDS  FOLLOW  UP  APPOINTMENT  FOR  MORE  REFILLS) 30 tablet 0   diltiazem (CARDIZEM CD) 360 MG 24 hr capsule Take 1 capsule (360 mg total) by mouth daily. 30 capsule 3   empagliflozin (JARDIANCE) 10 MG TABS tablet Take 1 tablet (10 mg total) by mouth daily. 90 tablet 3   furosemide (LASIX) 20 MG tablet Take 1 tablet (20 mg total) by mouth as needed. 30 tablet 3   furosemide (LASIX) 20 MG tablet Take 1 tablet (20 mg total) by mouth daily as needed for fluid or edema 30 tablet 5   isosorbide-hydrALAZINE (BIDIL) 20-37.5 MG tablet Take 2 tablets by mouth 3 (three) times daily. 545 tablet 3   losartan (COZAAR) 25 MG tablet Take 1 tablet (25 mg total) by mouth daily. 90 tablet 3   metoprolol succinate (TOPROL-XL) 100 MG 24 hr tablet Take 1 tablet (100 mg total) by mouth at bedtime. Take with or immediately following a meal. 30 tablet 6   oxyCODONE (OXY IR/ROXICODONE) 5 MG immediate release tablet Take 1 tablet (5 mg total) by mouth every 6 (six) hours as needed. 30 tablet 0   tirzepatide (MOUNJARO) 12.5 MG/0.5ML Pen Inject 12.5 mg into the skin once a week. 6 mL 3   No current facility-administered medications on file prior to visit.   Allergies  Allergen Reactions   Fish-Derived Products Anaphylaxis   Shellfish Allergy Anaphylaxis   Family History  Problem Relation Age of Onset   Hypertension Mother    Hypertension Father    Diabetes Father    Diabetes Sister    Hypertension Brother    Kidney disease Paternal Grandmother        ESRD   Coronary artery disease Neg Hx    Stroke Neg Hx    Cancer Neg Hx    PE: BP (!) 150/92   Pulse 88   Ht 6\' 8"  (2.032 m)   Wt (!) 371 lb (168.3 kg)   SpO2 96%   BMI 40.76 kg/m  Wt Readings from Last 10 Encounters:  05/13/23 (!) 371 lb (168.3 kg)  04/07/23 (!) 360 lb 9.6 oz (163.6 kg)  02/16/23 (!) 360 lb 3.7 oz (163.4 kg)  01/22/23 (!) 385 lb 2.3 oz (174.7 kg)  01/01/23 (!) 375 lb 6.4 oz (170.3 kg)  12/24/22 (!) 371 lb (168.3 kg)   12/22/22 (!) 371 lb 3.2 oz (168.4 kg)  11/23/22 (!) 375 lb 9.6 oz (170.4 kg)  10/02/22 (!) 370 lb (167.8 kg)  08/12/22 (!) 386 lb 9.6 oz (175.4 kg)   Constitutional: overweight, in NAD Eyes:  no exophthalmos, no lid lag, no stare ENT: no neck masses, no cervical lymphadenopathy Cardiovascular: RRR, No MRG, + significant bilateral leg swelling (R>L) Respiratory: CTA B Musculoskeletal: no deformities Skin:no rashes Neurological: no tremor with outstretched hands  ASSESSMENT: 1. DM2, non-insulin-dependent, uncontrolled, with complications - CHF - A-fib with RVR - h/o CVA, CKD - osteomyelitis of the right foot, history of hallux amputation  2.  Amiodarone-induced thyrotoxicosis  PLAN:  1. Patient with longstanding, type 2 diabetes, on oral antidiabetic regimen with SGLT2 inhibitor and GLP-1/GIP receptor agonist.  His sugars drastically improved after starting Mounjaro.  HbA1c decreased from 10.1% to 5.9%.  At last visit, HbA1c was even lower, at 5.8% but 3 months ago he had another HbA1c which was slightly higher, at 6.5%.  We did not change his medication at that time as sugars were well-controlled.  He was  off his CGM and we discussed about putting it on the inside of his arm or even on the abdomen. -At today's visit, sugars are all at goal, under 100 in the morning.  We discussed about letting me know if you start developing low blood sugars, in which case we can back off the Mounjaro dose.  For now, I recommended to continue the same regimen. - I suggested to:  Patient Instructions  Please continue: - Jardiance 10 mg daily in am - Mounjaro 12.5 mg weekly  Please continue off Prednisone.  Please stop at the lab.  Please return in 4 months.  - we checked his HbA1c: 5.4% (lower, excellent) - advised to check sugars at different times of the day - 1x a day, rotating check times - advised for yearly eye exams >> he is UTD - return to clinic in 4 months  2.  Amiodarone induced  thyrotoxicosis (AIT) -Patient with history of undetectable TSH and high free T4 in the setting of amiodarone treatment.  He did not have thyrotoxic symptoms: Weight loss, heat intolerance, hyperdefecation, palpitations, anxiety and in fact he had a 21 pound weight gain, possibly also related to fluid overload.  Amiodarone was stopped in 11/2021 due to the thyrotoxicosis.  At the same time, he was started on methimazole 5 mg twice a day and prednisone 20 mg daily.  He was also on Toprol-XL 100 mg daily. -When I first saw him, he did not appear to have other culprit for thyrotoxicosis other than amiodarone.  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  We were also able to decrease the prednisone doses and his TFTs normalized.  We stopped methimazole completely and decrease the prednisone further.  In 09/2022 his free T4 was slightly high so I advised him to continue 5 mg of prednisone daily, however, he ran out of the steroid but did not refill it. -At last visit, he did not have any thyrotoxic signs or symptoms and his TFTs were normal -At today's visit, he is also asymptomatic -We will recheck his TFTs  Orders Placed This Encounter  Procedures   TSH   T4, free   T3, free   POCT glycosylated hemoglobin (Hb A1C)   Component     Latest Ref Rng 05/13/2023  Hemoglobin A1C     4.0 - 5.6 % 5.4   TSH     0.40 - 4.50 mIU/L 0.83   T4,Free(Direct)     0.8 - 1.8 ng/dL 1.8   Triiodothyronine,Free,Serum     2.3 - 4.2 pg/mL 3.3   Normal TFTs.  Carlus Pavlov, MD PhD [facility] Endocrinology

## 2023-05-13 NOTE — Patient Instructions (Signed)
Please continue: - Jardiance 10 mg daily in am - Mounjaro 12.5 mg weekly  Please continue off Prednisone.  Please stop at the lab.  Please return in 4 months.

## 2023-05-14 LAB — T3, FREE: T3, Free: 3.3 pg/mL (ref 2.3–4.2)

## 2023-05-14 LAB — T4, FREE: Free T4: 1.8 ng/dL (ref 0.8–1.8)

## 2023-05-14 LAB — TSH: TSH: 0.83 m[IU]/L (ref 0.40–4.50)

## 2023-05-17 ENCOUNTER — Ambulatory Visit: Payer: 59 | Attending: Cardiology | Admitting: Cardiology

## 2023-05-17 ENCOUNTER — Other Ambulatory Visit (HOSPITAL_COMMUNITY): Payer: Self-pay

## 2023-05-17 ENCOUNTER — Encounter: Payer: Self-pay | Admitting: Cardiology

## 2023-05-17 VITALS — BP 134/80 | HR 100 | Ht >= 80 in | Wt 364.4 lb

## 2023-05-17 DIAGNOSIS — I5022 Chronic systolic (congestive) heart failure: Secondary | ICD-10-CM

## 2023-05-17 DIAGNOSIS — I4819 Other persistent atrial fibrillation: Secondary | ICD-10-CM

## 2023-05-17 DIAGNOSIS — I483 Typical atrial flutter: Secondary | ICD-10-CM | POA: Diagnosis not present

## 2023-05-17 DIAGNOSIS — I10 Essential (primary) hypertension: Secondary | ICD-10-CM

## 2023-05-17 DIAGNOSIS — D6869 Other thrombophilia: Secondary | ICD-10-CM | POA: Diagnosis not present

## 2023-05-17 MED ORDER — DILTIAZEM HCL ER COATED BEADS 360 MG PO CP24
360.0000 mg | ORAL_CAPSULE | Freq: Every day | ORAL | 2 refills | Status: DC
  Filled 2023-05-17 – 2023-05-18 (×2): qty 30, 30d supply, fill #0
  Filled 2023-06-12 – 2023-06-18 (×2): qty 30, 30d supply, fill #1
  Filled 2023-07-16 – 2023-07-19 (×2): qty 30, 30d supply, fill #2
  Filled 2023-08-03 – 2023-08-13 (×2): qty 30, 30d supply, fill #3
  Filled 2023-09-09: qty 30, 30d supply, fill #4
  Filled 2023-10-14: qty 30, 30d supply, fill #5
  Filled 2023-11-02 – 2023-11-09 (×2): qty 30, 30d supply, fill #6
  Filled 2023-11-30 – 2023-12-10 (×2): qty 30, 30d supply, fill #7
  Filled 2023-12-25 – 2024-01-08 (×2): qty 30, 30d supply, fill #8
  Filled 2024-01-13: qty 30, 30d supply, fill #0
  Filled 2024-01-13: qty 30, 30d supply, fill #8

## 2023-05-17 NOTE — Patient Instructions (Signed)
Medication Instructions:  Your physician recommends that you continue on your current medications as directed. Please refer to the Current Medication list given to you today.  *If you need a refill on your cardiac medications before your next appointment, please call your pharmacy*   Lab Work: Pre procedure labs -- we will call you to schedule:  BMP & CBC  If you have a lab test that is abnormal and we need to change your treatment, we will call you to review the results -- otherwise no news is good news.    Testing/Procedures: Your physician has requested that you have cardiac CT 1 month PRIOR to your ablation. Cardiac computed tomography (CT) is a painless test that uses an x-ray machine to take clear, detailed pictures of your heart.  We will call you to schedule.  Your physician has recommended that you have an ablation. Catheter ablation is a medical procedure used to treat some cardiac arrhythmias (irregular heartbeats). During catheter ablation, a long, thin, flexible tube is put into a blood vessel in your groin (upper thigh), or neck. This tube is called an ablation catheter. It is then guided to your heart through the blood vessel. Radio frequency waves destroy small areas of heart tissue where abnormal heartbeats may cause an arrhythmia to start.   Your ablation is scheduled for 09/10/2023. Please arrive at [facility] at 5:30 am.  We will call you for further instructions.   Follow-Up: At [facility], you and your health needs are our priority.  As part of our continuing mission to provide you with exceptional heart care, we have created designated Provider Care Teams.  These Care Teams include your primary Cardiologist (physician) and Advanced Practice Providers (APPs -  Physician Assistants and Nurse Practitioners) who all work together to provide you with the care you need, when you need it.  Your next appointment:   1 month(s) after your ablation  The format for  your next appointment:   In Person  Provider:   [facility] clinic   Thank you for choosing [facility]!!   Dory Horn, RN [phone]    Other Instructions   Cardiac Ablation Cardiac ablation is a procedure to destroy (ablate) some heart tissue that is sending bad signals. These bad signals cause problems in heart rhythm. The heart has many areas that make these signals. If there are problems in these areas, they can make the heart beat in a way that is not normal. Destroying some tissues can help make the heart rhythm normal. Tell your doctor about: Any allergies you have. All medicines you are taking. These include vitamins, herbs, eye drops, creams, and over-the-counter medicines. Any problems you or family members have had with medicines that make you fall asleep (anesthetics). Any blood disorders you have. Any surgeries you have had. Any medical conditions you have, such as kidney failure. Whether you are pregnant or may be pregnant. What are the risks? This is a safe procedure. But problems may occur, including: Infection. Bruising and bleeding. Bleeding into the chest. Stroke or blood clots. Damage to nearby areas of your body. Allergies to medicines or dyes. The need for a pacemaker if the normal system is damaged. Failure of the procedure to treat the problem. What happens before the procedure? Medicines Ask your doctor about: Changing or stopping your normal medicines. This is important. Taking aspirin and ibuprofen. Do not take these medicines unless your doctor tells you to take them. Taking other medicines, vitamins, herbs, and  supplements. General instructions Follow instructions from your doctor about what you cannot eat or drink. Plan to have someone take you home from the hospital or clinic. If you will be going home right after the procedure, plan to have someone with you for 24 hours. Ask your doctor what steps will be taken to prevent  infection. What happens during the procedure?  An IV tube will be put into one of your veins. You will be given a medicine to help you relax. The skin on your neck or groin will be numbed. A cut (incision) will be made in your neck or groin. A needle will be put through your cut and into a large vein. A tube (catheter) will be put into the needle. The tube will be moved to your heart. Dye may be put through the tube. This helps your doctor see your heart. Small devices (electrodes) on the tube will send out signals. A type of energy will be used to destroy some heart tissue. The tube will be taken out. Pressure will be held on your cut. This helps stop bleeding. A bandage will be put over your cut. The exact procedure may vary among doctors and hospitals. What happens after the procedure? You will be watched until you leave the hospital or clinic. This includes checking your heart rate, breathing rate, oxygen, and blood pressure. Your cut will be watched for bleeding. You will need to lie still for a few hours. Do not drive for 24 hours or as long as your doctor tells you. Summary Cardiac ablation is a procedure to destroy some heart tissue. This is done to treat heart rhythm problems. Tell your doctor about any medical conditions you may have. Tell him or her about all medicines you are taking to treat them. This is a safe procedure. But problems may occur. These include infection, bruising, bleeding, and damage to nearby areas of your body. Follow what your doctor tells you about food and drink. You may also be told to change or stop some of your medicines. After the procedure, do not drive for 24 hours or as long as your doctor tells you. This information is not intended to replace advice given to you by your health care provider. Make sure you discuss any questions you have with your health care provider. Document Revised: 07/04/2021 Document Reviewed: 03/16/2019 Elsevier Patient  Education  2023 [employer]   Cardiac Ablation, Care After  This sheet gives you information about how to care for yourself after your procedure. Your health care provider may also give you more specific instructions. If you have problems or questions, contact your health care provider. What can I expect after the procedure? After the procedure, it is common to have: Bruising around your puncture site. Tenderness around your puncture site. Skipped heartbeats. If you had an atrial fibrillation ablation, you may have atrial fibrillation during the first several months after your procedure.  Tiredness (fatigue).  Follow these instructions at home: Puncture site care  Follow instructions from your health care provider about how to take care of your puncture site. Make sure you: If present, leave stitches (sutures), skin glue, or adhesive strips in place. These skin closures may need to stay in place for up to 2 weeks. If adhesive strip edges start to loosen and curl up, you may trim the loose edges. Do not remove adhesive strips completely unless your health care provider tells you to do that. If a large square bandage is present, this  may be removed 24 hours after surgery.  Check your puncture site every day for signs of infection. Check for: Redness, swelling, or pain. Fluid or blood. If your puncture site starts to bleed, lie down on your back, apply firm pressure to the area, and contact your health care provider. Warmth. Pus or a bad smell. A pea or small marble sized lump at the site is normal and can take up to three months to resolve.  Driving Do not drive for at least 4 days after your procedure or however long your health care provider recommends. (Do not resume driving if you have previously been instructed not to drive for other health reasons.) Do not drive or use heavy machinery while taking prescription pain medicine. Activity Avoid activities that take a lot of effort for  at least 7 days after your procedure. Do not lift anything that is heavier than 5 lb (4.5 kg) for one week.  No sexual activity for 1 week.  Return to your normal activities as told by your health care provider. Ask your health care provider what activities are safe for you. General instructions Take over-the-counter and prescription medicines only as told by your health care provider. Do not use any products that contain nicotine or tobacco, such as cigarettes and e-cigarettes. If you need help quitting, ask your health care provider. You may shower after 24 hours, but Do not take baths, swim, or use a hot tub for 1 week.  Do not drink alcohol for 24 hours after your procedure. Keep all follow-up visits as told by your health care provider. This is important. Contact a health care provider if: You have redness, mild swelling, or pain around your puncture site. You have fluid or blood coming from your puncture site that stops after applying firm pressure to the area. Your puncture site feels warm to the touch. You have pus or a bad smell coming from your puncture site. You have a fever. You have chest pain or discomfort that spreads to your neck, jaw, or arm. You have chest pain that is worse with lying on your back or taking a deep breath. You are sweating a lot. You feel nauseous. You have a fast or irregular heartbeat. You have shortness of breath. You are dizzy or light-headed and feel the need to lie down. You have pain or numbness in the arm or leg closest to your puncture site. Get help right away if: Your puncture site suddenly swells. Your puncture site is bleeding and the bleeding does not stop after applying firm pressure to the area. These symptoms may represent a serious problem that is an emergency. Do not wait to see if the symptoms will go away. Get medical help right away. Call your local emergency services (911 in the [place] Do not drive yourself to the  hospital. Summary After the procedure, it is normal to have bruising and tenderness at the puncture site in your groin, neck, or forearm. Check your puncture site every day for signs of infection. Get help right away if your puncture site is bleeding and the bleeding does not stop after applying firm pressure to the area. This is a medical emergency. This information is not intended to replace advice given to you by your health care provider. Make sure you discuss any questions you have with your health care provider.

## 2023-05-17 NOTE — Progress Notes (Signed)
Electrophysiology Office Note:   Date:  05/17/2023  ID:  Mandela Abbot, DOB Jul 01, 1970, MRN [id number]  Primary Cardiologist: None Primary Heart Failure: Arvilla Meres, MD Electrophysiologist: Regan Lemming, MD      History of Present Illness:   Randy [name] is a 53 y.o. male with h/o persistent atrial fibrillation/typical atrial flutter, chronic systolic heart failure, hypertension, hypothyroidism, diabetes seen today for routine electrophysiology followup.   He was admitted to the hospital September 2024 with sepsis secondary to right lower extremity cellulitis.  He was in atrial flutter/fibrillation at the time.  Rate control was adjusted.  He was found to have cellulitis with an abscess and was treated with IV antibiotics that did not require surgical intervention.  Since last being seen in our clinic the patient reports doing overall well.  He has no chest pain or shortness of breath.  He does note that he has been in atrial fibrillation.  He has some fatigue.  He would like get back into normal rhythm.  he denies chest pain, palpitations, dyspnea, PND, orthopnea, nausea, vomiting, dizziness, syncope, edema, weight gain, or early satiety.   Review of systems complete and found to be negative unless listed in HPI.   EP Information / Studies Reviewed:    EKG is not ordered today. EKG from 04/07/23 reviewed which showed atrial fibrillation        Risk Assessment/Calculations:    CHA2DS2-VASc Score = 6   This indicates a 9.7% annual risk of stroke. The patient's score is based upon: CHF History: 1 HTN History: 1 Diabetes History: 1 Stroke History: 2 Vascular Disease History: 1 Age Score: 0 Gender Score: 0             Physical Exam:   VS:  BP 134/80   Pulse 100   Ht 6\' 8"  (2.032 m)   Wt (!) 364 lb 6.4 oz (165.3 kg)   SpO2 97%   BMI 40.03 kg/m    Wt Readings from Last 3 Encounters:  05/17/23 (!) 364 lb 6.4 oz (165.3 kg)  05/13/23 (!) 371 lb (168.3 kg)   04/07/23 (!) 360 lb 9.6 oz (163.6 kg)     GEN: Well nourished, well developed in no acute distress NECK: No JVD; No carotid bruits CARDIAC: Irregularly irregular rate and rhythm, no murmurs, rubs, gallops RESPIRATORY:  Clear to auscultation without rales, wheezing or rhonchi  ABDOMEN: Soft, non-tender, non-distended EXTREMITIES:  No edema; No deformity   ASSESSMENT AND PLAN:    1.  Persistent atrial fibrillation/flutter: Post ablation June 2024.  He is unfortunately had recurrent atrial fibrillation and flutter after infection.  He would benefit from repeat ablation.  Risk and benefits have been discussed.  He understands the risks and is agreed to the procedure.  Risk, benefits, and alternatives to EP study and radiofrequency/pulse field ablation for afib were also discussed in detail today. These risks include but are not limited to stroke, bleeding, vascular damage, tamponade, perforation, damage to the esophagus, lungs, and other structures, pulmonary vein stenosis, worsening renal function, and death. The patient understands these risk and wishes to proceed.  We Barnaby Rippeon therefore proceed with catheter ablation at the next available time.  Carto, ICE, anesthesia are requested for the procedure.  Boyd Litaker also obtain CT PV protocol prior to the procedure to exclude LAA thrombus and further evaluate atrial anatomy.  2.  Secondary hypercoagulable state: Currently on Eliquis for atrial fibrillation  3.  Chronic systolic heart failure: Currently on optimal medical therapy per primary  cardiology.  Ejection fraction is improved with maintenance of sinus rhythm.  3.  Hypertension: Currently well-controlled  4.  Coronary artery disease: Nonobstructive on cardiac catheterization.  Plan per primary cardiology.  5.  Obesity: Lifestyle modification encouraged.  On Mounjaro  6.  CKD stage IIIa: Has been referred to nephrology  Follow up with Dr. Elberta Fortis as usual post procedure  Signed, Lovette Merta Jorja Loa, MD

## 2023-05-18 ENCOUNTER — Other Ambulatory Visit (HOSPITAL_COMMUNITY): Payer: Self-pay

## 2023-05-28 ENCOUNTER — Other Ambulatory Visit (HOSPITAL_COMMUNITY): Payer: Self-pay | Admitting: Internal Medicine

## 2023-05-28 ENCOUNTER — Telehealth (HOSPITAL_COMMUNITY): Payer: Self-pay

## 2023-05-28 NOTE — Telephone Encounter (Signed)
Called to confirm/remind patient of their appointment [facility] on 05/31/23. However, patient needed to reschedule. Patient's appointment has been changed and pt agreed understanding.  Patient reminded to bring in all medications and/or complete list at his upcoming appointment.  Confirmed patient has transportation. Gave directions, instructed to utilize valet parking.  Confirmed appointment prior to ending call.

## 2023-05-31 ENCOUNTER — Encounter (HOSPITAL_COMMUNITY): Payer: 59

## 2023-06-03 ENCOUNTER — Other Ambulatory Visit (HOSPITAL_COMMUNITY): Payer: Self-pay

## 2023-06-03 ENCOUNTER — Other Ambulatory Visit: Payer: Self-pay

## 2023-06-07 ENCOUNTER — Other Ambulatory Visit (HOSPITAL_COMMUNITY): Payer: Self-pay | Admitting: Internal Medicine

## 2023-06-08 ENCOUNTER — Other Ambulatory Visit (HOSPITAL_COMMUNITY): Payer: Self-pay

## 2023-06-09 NOTE — Progress Notes (Incomplete)
Advanced Heart Failure Clinic Note  PCP: [facility]  Cardiologist: Dr. Gala Romney  HPI: [name] is a 53 year old with a history of HFrEF, paroxsymal A fib/atrial flutter s/p ablation with PVI w/ box lesion, HTN, hypertensive retinopathy, CKD Stage IIIa, hyperlipidemia, DMII, Bells palsy, CVA, and OSA.    Admitted in 12/02/2019 with newly diagnosed A fib + ischemic CVA. He did not meet criteria for IV tPA. MRI showed large acute focal infarct, left frontal lobe predominantly ACA distribution. CT angiogram of the head and neck performed revealing no significant carotid, vertebral or intracranial stenosis. Echocardiogram obtained with bubble study showing no cardiac etiology for stroke. LVEF was 40 to 45%.   Admitted 10/22 with A/C HFrEF and A fib RVR.  Echo showed EF down to 30-35% with speckled appearance concerning for amyloid. Once diuresed and on Iv amio, had TEE-DC-CV with restoration of NSR. RHC/LHC with min obs CAD, elevated filling pressures and normal cardiac output.  Diuresed 40 pounds. Discharge weight 367 pound.  Echo 3/23 EF 55% severe LVH.  Admitted 5/23 with osteomyelitis R great toe and cellulitis RLE. S/p R first ray amputation + placement wound VAC. Wound later dehisced and underwent surgical debridement 6/23.   Amiodarone stopped in 8/23 due to hyperthyroidism. Started on methimazole and prednisone.    He underwent Afib/flutter ablation with PVI/box lesion ablation in 06/24. TEE at that time with EF 25-30% in setting of AF with RVR.  In AFL with RVR 08/24. He was seen in ED and EP consulted. He was off metoprolol. Toprol Xl restarted.   Echo 01/15/23: EF 50-55%, severe LVH, RV mildly reduced, severe LAE  He was readmitted 09/24 with sepssis secondary to RLE cellulitis. He remained in Afib/flutter with RVR. Seen by EP, beta blocker increased and cardizem added. Plan for repeat ablation as outpatient. Unfortunately, he was admitted again in 10/24 d/t RLE cellulitis  with abscess. He was treated with IV abx and did not require surgical intervention.  Today he returns for AHF follow up. Overall feeling ***. Denies palpitations, CP, dizziness, edema, or PND/Orthopnea. *** SOB. Appetite ok. No fever or chills. Weight at home *** pounds. Taking all medications. Denies ETOH, tobacco or drug use.  BP tends to average 140s/80s.  Works full-time as a Dealer for [employer].   Cardiac Testing - Echo (3/23): EF 55%, severe LVH - Echo (10/22): EF 30-35% specked appearance concerning for amyloid. RV normal. LA severely dilated. Severe LVH.  - Echo (8/21): EF 40-45% and severe LVH - R/LHC (10/22)  Ao = 163/108 (134) LV = 136/25 RA =  15 RV = 51/13 PA = 57/19 (39) PCW = 28 Fick cardiac output/index = 9.2/3.0 PVR = 1.2 WU Ao sat = 94% PA sat = 65%, 65%  1. Mild non-obstructive CAD 2. NICM (likely hypertensive) with EF 35% by echo 3. Elevated filling pressures with normal CO 4. Severe systemic HTN  ROS: All systems negative except as listed in HPI, PMH and Problem List.  SH:  Social History   Socioeconomic History   Marital status: Married    Spouse name: Randy [name]   Number of children: 3   Years of education: Not on file   Highest education level: Bachelor's degree (e.g., BA, AB, BS)  Occupational History   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    Comment: [place]  Tobacco Use   Smoking status: Former    Types: Cigars   Smokeless tobacco: Never   Tobacco comments:  Former smoker 01/01/23  Vaping Use   Vaping status: Never Used  Substance and Sexual Activity   Alcohol use: Not Currently    Comment: stop drinking 01/01/23   Drug use: No   Sexual activity: Not on file  Other Topics Concern   Not on file  Social History Narrative   Newly married, 2012, 1 daughter, 1 son   No injectable steroid cycles   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, 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)   Regular exercise-yes   Social Drivers  of Health   Financial Resource Strain: Low Risk  (01/27/2021)   Overall Financial Resource Strain (CARDIA)    Difficulty of Paying Living Expenses: Not hard at all  Food Insecurity: No Food Insecurity (02/11/2023)   Hunger Vital Sign    Worried About Running Out of Food in the Last Year: Never true    Ran Out of Food in the Last Year: Never true  Transportation Needs: No Transportation Needs (02/11/2023)   PRAPARE - Administrator, Civil Service (Medical): No    Lack of Transportation (Non-Medical): No  Physical Activity: Not on file  Stress: Not on file  Social Connections: Unknown (09/06/2021)   Received [facility]   Social Network    Social Network: Not on file  Intimate Partner Violence: Not At Risk (02/11/2023)   Humiliation, Afraid, Rape, and Kick questionnaire    Fear of Current or Ex-Partner: No    Emotionally Abused: No    Physically Abused: No    Sexually Abused: No   FH:  Family History  Problem Relation Age of Onset   Hypertension Mother    Hypertension Father    Diabetes Father    Diabetes Sister    Hypertension Brother    Kidney disease Paternal Grandmother        ESRD   Coronary artery disease Neg Hx    Stroke Neg Hx    Cancer Neg Hx    Past Medical History:  Diagnosis Date   CKD (chronic kidney disease) stage 3, GFR 30-59 ml/min (HCC)    baseline Cr 1.5-1.7   Decreased visual acuity    Left eye, resolved - hypertensive retinopathy   Diabetes mellitus 2012   Type II   Hilar adenopathy    on CT scan 02/2010, on rpt scan stable/improved.   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   Systolic murmur    Vitamin D deficiency     Current Outpatient Medications  Medication Sig Dispense Refill   apixaban (ELIQUIS) 5 MG TABS tablet Take 1 tablet (5 mg total) by mouth 2 (two) times daily. 180 tablet 3   atorvastatin (LIPITOR) 20 MG tablet TAKE 1 TABLET BY MOUTH AT BEDTIME (NEEDS  FOLLOW  UP  APPOINTMENT  FOR  MORE  REFILLS) 30 tablet 0   diltiazem (CARDIZEM CD) 360 MG 24 hr capsule Take 1 capsule (360 mg total) by mouth daily. 90 capsule 2   empagliflozin (JARDIANCE) 10 MG TABS tablet Take 1 tablet by mouth once daily 90 tablet 1   furosemide (LASIX) 20 MG tablet TAKE  1 TABLET BY MOUTH ONCE DAILY AS NEEDED FOR  FLUID  OR  EDEMA 30 tablet 0   isosorbide-hydrALAZINE (BIDIL) 20-37.5 MG tablet Take 2 tablets by mouth 3 (three) times daily. 545 tablet 3   losartan (COZAAR) 25 MG tablet Take 1 tablet (25 mg total) by mouth daily. 90 tablet 3   metoprolol succinate (TOPROL-XL) 100 MG 24 hr tablet Take 1 tablet (100 mg total) by mouth at bedtime. Take with or immediately following a meal. 30 tablet 6   tirzepatide (MOUNJARO) 12.5 MG/0.5ML Pen Inject 12.5 mg into the skin once a week. 6 mL 3   No current facility-administered medications for this visit.   There were no vitals taken for this visit.  Wt Readings from Last 3 Encounters:  05/17/23 (!) 165.3 kg (364 lb 6.4 oz)  05/13/23 (!) 168.3 kg (371 lb)  04/07/23 (!) 163.6 kg (360 lb 9.6 oz)   PHYSICAL EXAM: General:  *** appearing.  No respiratory difficulty HEENT: normal Neck: supple. JVD *** cm. Carotids 2+ bilat; no bruits. No lymphadenopathy or thyromegaly appreciated. Cor: PMI nondisplaced. Regular rate & rhythm. No rubs, gallops or murmurs. Lungs: clear Abdomen: soft, nontender, nondistended. No hepatosplenomegaly. No bruits or masses. Good bowel sounds. Extremities: no cyanosis, clubbing, rash, edema  Neuro: alert & oriented x 3, cranial nerves grossly intact. moves all 4 extremities w/o difficulty. Affect pleasant.   ECG (personally reviewed from 12/24): AF 98  bpm  ASSESSMENT & PLAN:   1. Chronic Heart Failure, HFrEF>>HFimEF NICM - NICM. LHC w/ mild nonobstructive CAD. Suspect HTN cardiomyopathy.  - Echo 01/31/21 EF 30-35% with severe LVH. Doubt amyloid with uncontrolled hypertension however if EF drops again will need cMRI if he fits - Echo 3/23 EF 55-60% (recovered EF) - Echo 04/03/22 EF 55-60% - TEE 06/24: EF 25-30% in setting of AF with RVR - Afib/flutter ablation 06/24 - Echo 09/24: EF 50-55%, severe LVH, RV mildly reduced - NYHA II. Volume up in setting of AF.  Has been taking Lasix PRN, recommended he take 20 mg lasix daily for next few days until edema resolved. He will call if volume status not improving. Wear compression stockings. Check CMET and BNP today, may need K supplement. *** - Continue Jardiance 10 mg daily.  - Continue Bidil 2 tab tid.  - Continue Toprol XL 100 mg daily. Cleda Daub and entresto stopped during recent admit. EF has recovered. Add Losartan 25 mg daily. Could eventually switch back to entresto. *** -BMET 2 weeks   2. PAF/AFL: - S/P TEE/DC-CV (10/22) with restoration NSR  - Recurrent AF 3/23 s/p DCCV - Back in AF after amio stopped in 8/23 due to hyperthyroidism  - Afib/flutter ablation 06/24 - Recurrent Fib/flutter 08/24. Saw Dr. Elberta Fortis 1/25, plan for repeat ablation.  - Continue Eliquis 5 mg bid. - Encouraged use of CPAP   3. Hypertension  - BP elevated - Some of his medications were stopped this fall during admissions with sepsis. Adding meds back as above. ***   4. CKD Stage IIIb  - Baseline SCr 1.7-2.0 - Referred to Nephrology - Labs today.   5. DMII - Last A1c 6.5% - Continue Jardiance and Mounjaro - Sees Endocrinology.    6. OSA - On CPAP.    7. CAD - Mild nonobstructive on cath - No chest pain.  - c/w statin, LDL Goal < 70.  - No ASA w/ Eliquis.   8. S/p R great toe amputation - s/p R LE Osteomyelitis/R lower leg  cellulitis, required Wound VAC 5/23. - 2 recent admissions with R lower  extremity cellulitis/sepsis. Resolved.  9. Obesity - There is no height or weight on file to calculate BMI. - Now on Mounjaro   10. Hyperthyroidism - felt to be due to amio - Amio stopped - Methimazole and prednisone started 12/23, now off - Sees Dr. Lafe Garin  Follow-up ***   Alen Bleacher, NP  11:07 AM

## 2023-06-12 ENCOUNTER — Other Ambulatory Visit (HOSPITAL_COMMUNITY): Payer: Self-pay | Admitting: Internal Medicine

## 2023-06-13 ENCOUNTER — Other Ambulatory Visit (HOSPITAL_COMMUNITY): Payer: Self-pay

## 2023-06-14 ENCOUNTER — Other Ambulatory Visit: Payer: Self-pay

## 2023-06-17 ENCOUNTER — Inpatient Hospital Stay (HOSPITAL_COMMUNITY): Admission: RE | Admit: 2023-06-17 | Payer: 59 | Source: Ambulatory Visit

## 2023-06-18 ENCOUNTER — Other Ambulatory Visit (HOSPITAL_COMMUNITY): Payer: Self-pay

## 2023-06-23 ENCOUNTER — Telehealth: Payer: Self-pay | Admitting: Cardiology

## 2023-06-23 DIAGNOSIS — I4819 Other persistent atrial fibrillation: Secondary | ICD-10-CM

## 2023-06-23 NOTE — Telephone Encounter (Signed)
 Pt is calling to get info regarding cardioversion??? That is supposed to be scheduled

## 2023-06-23 NOTE — Telephone Encounter (Signed)
 Pt was calling to see if could move up his ablation.  Pt aware that I will discuss with Dr. Elberta Fortis and let him know today.  Possible date in couple weeks, but no CT slot available that soon, not sure could proceed on repeat ablation w/o CT.   States he is starting to experience LLE. Will discuss w/ MD and let him know.  Patient is agreeable to plan.

## 2023-06-23 NOTE — Telephone Encounter (Signed)
 Left message to call back

## 2023-06-24 NOTE — Telephone Encounter (Signed)
Patient is calling for update. Please advise

## 2023-06-24 NOTE — Telephone Encounter (Signed)
 Left patient detailed message that Dr. Elberta Fortis said ok to proceed with ablation next month (3/11 or 3/13).  Can proceed without CT need (as long as patient has not missed their blood thinner) Aware I am not in the office tomorrow but can call and speak with procedure scheduler, April, otherwise I would follow up next week.

## 2023-06-29 ENCOUNTER — Other Ambulatory Visit: Payer: Self-pay

## 2023-06-29 NOTE — Telephone Encounter (Signed)
 Pt is scheduled for Afib Ablation with Dr. Elberta Fortis on 3/13 at 10:00 am. He will go to [facility] for updated labs this week.  Once completed, I will send Instruction letter via MyChart.  Pt aware to hold Mounjaro x 1 week (he takes on Thursdays) - Jardiance x 3 days.   Pt confirmed he has NOT missed a dose of Eliquis in the last Month.

## 2023-06-29 NOTE — Telephone Encounter (Signed)
 Patient is returning call to schedule his procedure.

## 2023-07-01 ENCOUNTER — Telehealth: Payer: Self-pay | Admitting: Cardiology

## 2023-07-01 NOTE — Telephone Encounter (Signed)
 Pt made aware that insurance has denied ablation that is planned for next week.  Aware our billing dept has started a stat appeal on this and we will know something by Monday. Aware will follow up once hear back. Patient verbalized understanding and agreeable to plan.

## 2023-07-01 NOTE — Telephone Encounter (Signed)
Patient states he was returning call. Please advise ?

## 2023-07-02 ENCOUNTER — Telehealth (HOSPITAL_COMMUNITY): Payer: Self-pay

## 2023-07-02 NOTE — Telephone Encounter (Signed)
 Attempted to reach patient to discuss upcoming procedure, no answer. Left VM for patient to return call.

## 2023-07-02 NOTE — Telephone Encounter (Signed)
 Left detailed message informing pt that we received approval for procedure next week!!!

## 2023-07-02 NOTE — Telephone Encounter (Addendum)
 Call placed to patient to discuss upcoming procedure. Patient is aware insurance denial and appeal process started.    CT: will need a TEE on day of procedure. Unable to get CT scan prior to procedure. Labs: ordered but patients wants to obtain after insurance feedback.     Any recent signs of acute illness or been started on antibiotics? No Any diabetic medications to hold?  Mounjaro x 1 week, Jardiance x 3 days Any missed doses of blood thinner?  No Advised patient to continue taking ANTICOAGULANT: Eliquis (Apixaban) without missing any doses.  Medication instructions:  On the morning of your procedure DO NOT take any medication., including Eliquis or the procedure may be rescheduled. Nothing to eat or drink after midnight prior to your procedure.  Confirmed patient is scheduled for Atrial Fibrillation Ablation on Thursday, March 13 with Dr. Loman Brooklyn. Instructed patient to arrive at the Main Entrance A at [facility]: [address] [place], [place] [address] and check in at Admitting at 8:00 AM.   Advised of plan to go home the same day and will only stay overnight if medically necessary. You MUST have a responsible adult to drive you home and MUST be with you the first 24 hours after you arrive home or your procedure could be cancelled.  Patient verbalized understanding to all instructions provided and agreed to proceed with procedure.

## 2023-07-04 ENCOUNTER — Other Ambulatory Visit (HOSPITAL_COMMUNITY): Payer: Self-pay | Admitting: Internal Medicine

## 2023-07-05 ENCOUNTER — Telehealth: Payer: Self-pay | Admitting: Cardiology

## 2023-07-05 NOTE — Telephone Encounter (Signed)
Lm to call back ./cy 

## 2023-07-05 NOTE — Telephone Encounter (Signed)
 Pt not sure he will be able to get pre procedure blood work before Thursday.  Discussed w/ Dr. Elberta Fortis.  Patient informed to try and get done if possible, if unable they will get it at the hospital prior to procedure the morning of.  Patient verbalized understanding and agreeable to plan.

## 2023-07-05 NOTE — Telephone Encounter (Signed)
 Patient states he thinks he may not be able to make it to have labs drawn before ablation. He would like a call back to discuss.

## 2023-07-05 NOTE — Telephone Encounter (Signed)
 Pt returning call to nurse

## 2023-07-06 ENCOUNTER — Other Ambulatory Visit (HOSPITAL_COMMUNITY): Payer: Self-pay

## 2023-07-06 MED ORDER — FUROSEMIDE 20 MG PO TABS
20.0000 mg | ORAL_TABLET | ORAL | 0 refills | Status: DC | PRN
  Filled 2023-07-06 – 2023-07-08 (×2): qty 30, 30d supply, fill #0

## 2023-07-07 ENCOUNTER — Other Ambulatory Visit (HOSPITAL_COMMUNITY): Payer: Self-pay

## 2023-07-07 NOTE — Pre-Procedure Instructions (Signed)
 Instructed patient on the following items: Arrival time 0730, need to draw labs. Nothing to eat or drink after midnight No meds AM of procedure Responsible person to drive you home and stay with you for 24 hrs  Have you missed any doses of anti-coagulant Eliquis- takes twice a day, hasn't missed any doses.  Don't take dose morning of procedure.    Last Randy [name] was Feb 2/27.

## 2023-07-08 ENCOUNTER — Inpatient Hospital Stay (HOSPITAL_COMMUNITY): Admission: RE | Admit: 2023-07-08 | Source: Ambulatory Visit

## 2023-07-08 ENCOUNTER — Ambulatory Visit (HOSPITAL_BASED_OUTPATIENT_CLINIC_OR_DEPARTMENT_OTHER)

## 2023-07-08 ENCOUNTER — Other Ambulatory Visit: Payer: Self-pay

## 2023-07-08 ENCOUNTER — Ambulatory Visit (HOSPITAL_COMMUNITY)

## 2023-07-08 ENCOUNTER — Encounter (HOSPITAL_COMMUNITY): Payer: Self-pay

## 2023-07-08 ENCOUNTER — Encounter (HOSPITAL_COMMUNITY)
Admission: RE | Disposition: A | Discharge status: Home / Self Care | Payer: Self-pay | Source: Home / Self Care | Attending: Cardiology

## 2023-07-08 ENCOUNTER — Other Ambulatory Visit (HOSPITAL_COMMUNITY): Payer: Self-pay

## 2023-07-08 ENCOUNTER — Ambulatory Visit (HOSPITAL_COMMUNITY)
Admission: RE | Admit: 2023-07-08 | Discharge: 2023-07-08 | Disposition: A | Discharge status: Home / Self Care | Attending: Cardiology | Admitting: Cardiology

## 2023-07-08 DIAGNOSIS — E66813 Obesity, class 3: Secondary | ICD-10-CM | POA: Insufficient documentation

## 2023-07-08 DIAGNOSIS — Z7984 Long term (current) use of oral hypoglycemic drugs: Secondary | ICD-10-CM | POA: Insufficient documentation

## 2023-07-08 DIAGNOSIS — I5022 Chronic systolic (congestive) heart failure: Secondary | ICD-10-CM | POA: Diagnosis not present

## 2023-07-08 DIAGNOSIS — N1832 Chronic kidney disease, stage 3b: Secondary | ICD-10-CM

## 2023-07-08 DIAGNOSIS — I5021 Acute systolic (congestive) heart failure: Secondary | ICD-10-CM | POA: Diagnosis not present

## 2023-07-08 DIAGNOSIS — I483 Typical atrial flutter: Secondary | ICD-10-CM | POA: Insufficient documentation

## 2023-07-08 DIAGNOSIS — I4819 Other persistent atrial fibrillation: Secondary | ICD-10-CM | POA: Insufficient documentation

## 2023-07-08 DIAGNOSIS — E119 Type 2 diabetes mellitus without complications: Secondary | ICD-10-CM | POA: Diagnosis not present

## 2023-07-08 DIAGNOSIS — I110 Hypertensive heart disease with heart failure: Secondary | ICD-10-CM | POA: Insufficient documentation

## 2023-07-08 DIAGNOSIS — I130 Hypertensive heart and chronic kidney disease with heart failure and stage 1 through stage 4 chronic kidney disease, or unspecified chronic kidney disease: Secondary | ICD-10-CM | POA: Diagnosis not present

## 2023-07-08 DIAGNOSIS — I4891 Unspecified atrial fibrillation: Secondary | ICD-10-CM | POA: Diagnosis not present

## 2023-07-08 DIAGNOSIS — Z79899 Other long term (current) drug therapy: Secondary | ICD-10-CM | POA: Diagnosis not present

## 2023-07-08 DIAGNOSIS — Z6841 Body Mass Index (BMI) 40.0 and over, adult: Secondary | ICD-10-CM | POA: Diagnosis not present

## 2023-07-08 DIAGNOSIS — I4892 Unspecified atrial flutter: Secondary | ICD-10-CM | POA: Diagnosis not present

## 2023-07-08 DIAGNOSIS — E039 Hypothyroidism, unspecified: Secondary | ICD-10-CM | POA: Diagnosis not present

## 2023-07-08 HISTORY — PX: ATRIAL FIBRILLATION ABLATION: EP1191

## 2023-07-08 LAB — BASIC METABOLIC PANEL
Anion gap: 10 (ref 5–15)
BUN: 24 mg/dL — ABNORMAL HIGH (ref 6–20)
CO2: 22 mmol/L (ref 22–32)
Calcium: 8.4 mg/dL — ABNORMAL LOW (ref 8.9–10.3)
Chloride: 106 mmol/L (ref 98–111)
Creatinine, Ser: 1.97 mg/dL — ABNORMAL HIGH (ref 0.61–1.24)
GFR, Estimated: 41 mL/min — ABNORMAL LOW (ref >60)
Glucose, Bld: 107 mg/dL — ABNORMAL HIGH (ref 70–99)
Potassium: 5.1 mmol/L (ref 3.5–5.1)
Sodium: 138 mmol/L (ref 135–145)

## 2023-07-08 LAB — CBC
HCT: 41.3 % (ref 39.0–52.0)
Hemoglobin: 13.1 g/dL (ref 13.0–17.0)
MCH: 24.5 pg — ABNORMAL LOW (ref 26.0–34.0)
MCHC: 31.7 g/dL (ref 30.0–36.0)
MCV: 77.2 fL — ABNORMAL LOW (ref 80.0–100.0)
Platelets: 382 10*3/uL (ref 150–400)
RBC: 5.35 MIL/uL (ref 4.22–5.81)
RDW: 18.1 % — ABNORMAL HIGH (ref 11.5–15.5)
WBC: 8.7 10*3/uL (ref 4.0–10.5)
nRBC: 0 % (ref 0.0–0.2)

## 2023-07-08 LAB — POCT ACTIVATED CLOTTING TIME: Activated Clotting Time: 239 s

## 2023-07-08 LAB — GLUCOSE, CAPILLARY
Glucose-Capillary: 99 mg/dL (ref 70–99)
Glucose-Capillary: 86 mg/dL (ref 70–99)

## 2023-07-08 MED ORDER — DILTIAZEM HCL ER COATED BEADS 180 MG PO CP24
360.0000 mg | ORAL_CAPSULE | Freq: Every day | ORAL | Status: DC
  Administered 2023-07-08: 360 mg via ORAL
  Filled 2023-07-08: qty 2

## 2023-07-08 MED ORDER — FENTANYL CITRATE (PF) 250 MCG/5ML IJ SOLN
INTRAMUSCULAR | Status: DC | PRN
  Administered 2023-07-08 (×2): 50 ug via INTRAVENOUS

## 2023-07-08 MED ORDER — SODIUM CHLORIDE 0.9 % IV SOLN: INTRAVENOUS | Status: DC

## 2023-07-08 MED ORDER — PROTAMINE SULFATE 10 MG/ML IV SOLN
INTRAVENOUS | Status: DC | PRN
  Administered 2023-07-08: 40 mg via INTRAVENOUS

## 2023-07-08 MED ORDER — SODIUM CHLORIDE 0.9 % IV SOLN: 250.0000 mL | INTRAVENOUS | Status: DC | PRN

## 2023-07-08 MED ORDER — METOPROLOL SUCCINATE ER 100 MG PO TB24
100.0000 mg | ORAL_TABLET | Freq: Once | ORAL | Status: AC
  Administered 2023-07-08: 100 mg via ORAL
  Filled 2023-07-08: qty 1

## 2023-07-08 MED ORDER — ATROPINE SULFATE 1 MG/ML IV SOLN
INTRAVENOUS | Status: DC | PRN
  Administered 2023-07-08: 1 mg via INTRAVENOUS

## 2023-07-08 MED ORDER — LIDOCAINE 2% (20 MG/ML) 5 ML SYRINGE
INTRAMUSCULAR | Status: DC | PRN
  Administered 2023-07-08: 80 mg via INTRAVENOUS

## 2023-07-08 MED ORDER — SUGAMMADEX SODIUM 200 MG/2ML IV SOLN
INTRAVENOUS | Status: DC | PRN
  Administered 2023-07-08: 200 mg via INTRAVENOUS

## 2023-07-08 MED ORDER — HEPARIN SODIUM (PORCINE) 1000 UNIT/ML IJ SOLN
INTRAMUSCULAR | Status: DC | PRN
  Administered 2023-07-08: 10000 [IU] via INTRAVENOUS
  Administered 2023-07-08: 16000 [IU] via INTRAVENOUS

## 2023-07-08 MED ORDER — PROPOFOL 500 MG/50ML IV EMUL
INTRAVENOUS | Status: DC | PRN
  Administered 2023-07-08: 70 ug/kg/min via INTRAVENOUS

## 2023-07-08 MED ORDER — ONDANSETRON HCL 4 MG/2ML IJ SOLN: 4.0000 mg | Freq: Four times a day (QID) | INTRAMUSCULAR | Status: DC | PRN

## 2023-07-08 MED ORDER — HEPARIN (PORCINE) IN NACL 1000-0.9 UT/500ML-% IV SOLN
INTRAVENOUS | Status: DC | PRN
  Administered 2023-07-08 (×4): 500 mL

## 2023-07-08 MED ORDER — LOSARTAN POTASSIUM 25 MG PO TABS
25.0000 mg | ORAL_TABLET | Freq: Once | ORAL | Status: AC
  Administered 2023-07-08: 25 mg via ORAL
  Filled 2023-07-08: qty 1

## 2023-07-08 MED ORDER — PROPOFOL 10 MG/ML IV BOLUS
INTRAVENOUS | Status: DC | PRN
  Administered 2023-07-08 (×2): 150 mg via INTRAVENOUS

## 2023-07-08 MED ORDER — PHENYLEPHRINE HCL-NACL 20-0.9 MG/250ML-% IV SOLN
INTRAVENOUS | Status: DC | PRN
  Administered 2023-07-08: 20 ug/min via INTRAVENOUS

## 2023-07-08 MED ORDER — ONDANSETRON HCL 4 MG/2ML IJ SOLN
INTRAMUSCULAR | Status: DC | PRN
  Administered 2023-07-08: 4 mg via INTRAVENOUS

## 2023-07-08 MED ORDER — ROCURONIUM BROMIDE 10 MG/ML (PF) SYRINGE
PREFILLED_SYRINGE | INTRAVENOUS | Status: DC | PRN
  Administered 2023-07-08 (×2): 50 mg via INTRAVENOUS

## 2023-07-08 MED ORDER — FENTANYL CITRATE (PF) 100 MCG/2ML IJ SOLN
INTRAMUSCULAR | Status: AC
  Filled 2023-07-08: qty 2

## 2023-07-08 MED ORDER — ACETAMINOPHEN 325 MG PO TABS: 650.0000 mg | ORAL_TABLET | ORAL | Status: DC | PRN

## 2023-07-08 MED ORDER — SODIUM CHLORIDE 0.9% FLUSH: 3.0000 mL | INTRAVENOUS | Status: DC | PRN

## 2023-07-08 MED ORDER — ISOSORB DINITRATE-HYDRALAZINE 20-37.5 MG PO TABS
2.0000 | ORAL_TABLET | Freq: Once | ORAL | Status: AC
  Administered 2023-07-08: 2 via ORAL
  Filled 2023-07-08: qty 2

## 2023-07-08 NOTE — Progress Notes (Addendum)
 Pts bp elevated since arrival top PSS, secure chat to Dr and Veleta Miners NP, will wait for further instructions.  1340 no orders yet for bp meds, continue to observe. 1350 pharmacy called for meds to be sent.

## 2023-07-08 NOTE — Transfer of Care (Signed)
 Immediate Anesthesia Transfer of Care Note  Patient: Randy [name]  Procedure(s) Performed: ATRIAL FIBRILLATION ABLATION  Patient Location: PACU and Cath Lab  Anesthesia Type:General  Level of Consciousness: awake and alert   Airway & Oxygen Therapy: Patient connected to nasal cannula oxygen  Post-op Assessment: Report given to RN and Post -op Vital signs reviewed and stable  Post vital signs: stable  Last Vitals:  Vitals Value Taken Time  BP    Temp    Pulse    Resp    SpO2      Last Pain:  Vitals:   07/08/23 0837  TempSrc: Oral         Complications: There were no known notable events for this encounter.

## 2023-07-08 NOTE — Anesthesia Postprocedure Evaluation (Signed)
 Anesthesia Post Note  Patient: Randy [name]  Procedure(s) Performed: ATRIAL FIBRILLATION ABLATION     Patient location during evaluation: PACU Anesthesia Type: General Level of consciousness: awake and alert Pain management: pain level controlled Vital Signs Assessment: post-procedure vital signs reviewed and stable Respiratory status: spontaneous breathing, nonlabored ventilation, respiratory function stable and patient connected to nasal cannula oxygen Cardiovascular status: blood pressure returned to baseline and stable Postop Assessment: no apparent nausea or vomiting Anesthetic complications: no  There were no known notable events for this encounter.  Last Vitals:  Vitals:   07/08/23 1401 07/08/23 1441  BP: (!) 181/128 (!) 167/113  Pulse: 74 89  Resp: 13 (!) 21  Temp:    SpO2: 94% 92%    Last Pain:  Vitals:   07/08/23 1247  TempSrc:   PainSc: 0-No pain                 Marquis Down L Zurri Rudden

## 2023-07-08 NOTE — H&P (Signed)
  Electrophysiology Office Note:   Date:  07/08/2023  ID:  Randy [name], DOB 1970/08/02, MRN [id number]  Primary Cardiologist: None Primary Heart Failure: Arvilla Meres, MD Electrophysiologist: Regan Lemming, MD      History of Present Illness:   Randy [name] is a 53 y.o. male with h/o persistent atrial fibrillation/typical atrial flutter, chronic systolic heart failure, hypertension, hypothyroidism, diabetes seen today for routine electrophysiology followup.   He was admitted to the hospital September 2024 with sepsis secondary to right lower extremity cellulitis.  He was in atrial flutter/fibrillation at the time.  Rate control was adjusted.  He was found to have cellulitis with an abscess and was treated with IV antibiotics that did not require surgical intervention.  Today, denies symptoms of palpitations, chest pain, shortness of breath, orthopnea, PND, lower extremity edema, claudication, dizziness, presyncope, syncope, bleeding, or neurologic sequela. The patient is tolerating medications without difficulties. Plan ablation today.   EP Information / Studies Reviewed:    EKG is not ordered today. EKG from 04/07/23 reviewed which showed atrial fibrillation        Risk Assessment/Calculations:    CHA2DS2-VASc Score =     This indicates a  % annual risk of stroke. The patient's score is based upon:              Physical Exam:   VS:  BP (!) 166/119   Pulse 74   Temp 97.8 F (36.6 C) (Oral)   Ht 6\' 8"  (2.032 m)   Wt (!) 167.8 kg   SpO2 94%   BMI 40.65 kg/m    Wt Readings from Last 3 Encounters:  07/08/23 (!) 167.8 kg  05/17/23 (!) 165.3 kg  05/13/23 (!) 168.3 kg    GEN: No acute distress.   Neck: No JVD Cardiac: RRR, no murmurs, rubs, or gallops.  Respiratory: decreased BS bases bilaterally. GI: Soft, nontender, non-distended  MS: No edema; No deformity. Neuro:  Nonfocal  Skin: warm and dry Psych: Normal affect    ASSESSMENT AND PLAN:    1.   Persistent atrial fibrillation/flutter: Randy [name] has presented today for surgery, with the diagnosis of AF.  The various methods of treatment have been discussed with the patient and family. After consideration of risks, benefits and other options for treatment, the patient has consented to  Procedure(s): Catheter ablation as a surgical intervention .  Risks include but not limited to complete heart block, stroke, esophageal damage, nerve damage, bleeding, vascular damage, tamponade, perforation, MI, and death. The patient's history has been reviewed, patient examined, no change in status, stable for surgery.  I have reviewed the patient's chart and labs.  Questions were answered to the patient's satisfaction.    Randy Gardenhire Elberta Fortis, MD 07/08/2023 8:51 AM

## 2023-07-08 NOTE — Discharge Instructions (Signed)

## 2023-07-08 NOTE — Progress Notes (Signed)
 Patient states he hasn't missed any doses of his eliquis in the past four weeks.

## 2023-07-08 NOTE — Anesthesia Procedure Notes (Signed)
 Procedure Name: Intubation Date/Time: 07/08/2023 10:20 AM  Performed by: Vena Austria, CRNAPre-anesthesia Checklist: Patient identified, Emergency Drugs available, Suction available, Patient being monitored and Timeout performed Patient Re-evaluated:Patient Re-evaluated prior to induction Oxygen Delivery Method: Circle system utilized Preoxygenation: Pre-oxygenation with 100% oxygen Induction Type: IV induction Ventilation: Mask ventilation without difficulty Laryngoscope Size: Glidescope and 3 Grade View: Grade I Tube type: Oral Tube size: 8.0 mm Number of attempts: 1 Placement Confirmation: ETT inserted through vocal cords under direct vision, positive ETCO2, CO2 detector and breath sounds checked- equal and bilateral Secured at: 23 cm Tube secured with: Tape Dental Injury: Teeth and Oropharynx as per pre-operative assessment

## 2023-07-08 NOTE — Anesthesia Preprocedure Evaluation (Addendum)
 Anesthesia Evaluation  Patient identified by MRN, date of birth, ID band Patient awake    Reviewed: Allergy & Precautions, NPO status , Patient's Chart, lab work & pertinent test results  Airway Mallampati: I  TM Distance: >3 FB Neck ROM: Full    Dental  (+) Dental Advisory Given, Chipped,    Pulmonary former smoker   Pulmonary exam normal breath sounds clear to auscultation       Cardiovascular hypertension, Pt. on medications +CHF  Normal cardiovascular exam+ dysrhythmias Atrial Fibrillation  Rhythm:Regular Rate:Normal  TTE 2024 1. Left ventricular ejection fraction, by estimation, is 50 to 55%. The  left ventricle has low normal function. The left ventricle has no regional  wall motion abnormalities. The left ventricular internal cavity size was  mildly dilated. There is severe  concentric left ventricular hypertrophy. Left ventricular diastolic  function could not be evaluated.   2. Right ventricular systolic function is mildly reduced. The right  ventricular size is mildly enlarged. There is mildly elevated pulmonary  artery systolic pressure. The estimated right ventricular systolic  pressure is 34.4 mmHg.   3. Left atrial size was severely dilated.   4. Right atrial size was mildly dilated.   5. The mitral valve is normal in structure. No evidence of mitral valve  regurgitation. No evidence of mitral stenosis.   6. The aortic valve is tricuspid. Aortic valve regurgitation is not  visualized. No aortic stenosis is present.   7. There is mild dilatation of the ascending aorta, measuring 42 mm.   8. The inferior vena cava is dilated in size with >50% respiratory  variability, suggesting right atrial pressure of 8 mmHg.     Neuro/Psych CVA, No Residual Symptoms  negative psych ROS   GI/Hepatic negative GI ROS, Neg liver ROS,,,  Endo/Other  diabetes, Type 2, Oral Hypoglycemic Agents  Class 3 obesity (BMI 41)   Renal/GU Renal InsufficiencyRenal disease  negative genitourinary   Musculoskeletal negative musculoskeletal ROS (+)    Abdominal   Peds  Hematology negative hematology ROS (+)   Anesthesia Other Findings   Reproductive/Obstetrics                             Anesthesia Physical Anesthesia Plan  ASA: 3  Anesthesia Plan: General   Post-op Pain Management: Minimal or no pain anticipated   Induction: Intravenous  PONV Risk Score and Plan: 2 and Midazolam, Dexamethasone and Ondansetron  Airway Management Planned: Oral ETT and Video Laryngoscope Planned  Additional Equipment:   Intra-op Plan:   Post-operative Plan: Extubation in OR  Informed Consent: I have reviewed the patients History and Physical, chart, labs and discussed the procedure including the risks, benefits and alternatives for the proposed anesthesia with the patient or authorized representative who has indicated his/her understanding and acceptance.     Dental advisory given  Plan Discussed with: CRNA  Anesthesia Plan Comments:        Anesthesia Quick Evaluation

## 2023-07-09 ENCOUNTER — Encounter: Payer: Self-pay | Admitting: Emergency Medicine

## 2023-07-09 ENCOUNTER — Telehealth (HOSPITAL_COMMUNITY): Payer: Self-pay

## 2023-07-09 ENCOUNTER — Encounter (HOSPITAL_COMMUNITY): Payer: Self-pay | Admitting: Cardiology

## 2023-07-09 NOTE — Telephone Encounter (Signed)
 Attempted to reach patient to follow up with procedure completed on 07/08/23, no answer. Left VM for patient to return call.

## 2023-07-13 MED FILL — Fentanyl Citrate Soln Prefilled Syringe 100 MCG/2ML: INTRAMUSCULAR | Qty: 2 | Status: AC

## 2023-07-15 ENCOUNTER — Telehealth (HOSPITAL_COMMUNITY): Payer: Self-pay

## 2023-07-15 NOTE — Telephone Encounter (Signed)
 Called to confirm/remind patient of their appointment at the [facility] on 07/16/23.   Appointment:   [x] Confirmed  [] Left mess   [] No answer/No voice mail  [] Phone not in service  Patient reminded to bring all medications and/or complete list.  Confirmed patient has transportation. Gave directions, instructed to utilize valet parking.

## 2023-07-16 ENCOUNTER — Ambulatory Visit (HOSPITAL_COMMUNITY)
Admission: RE | Admit: 2023-07-16 | Discharge: 2023-07-16 | Disposition: A | Discharge status: Home / Self Care | Payer: 59 | Source: Ambulatory Visit | Attending: Family Medicine | Admitting: Family Medicine

## 2023-07-16 ENCOUNTER — Encounter (HOSPITAL_COMMUNITY): Payer: Self-pay

## 2023-07-16 ENCOUNTER — Other Ambulatory Visit (HOSPITAL_COMMUNITY): Payer: Self-pay

## 2023-07-16 VITALS — BP 170/113 | HR 82 | Wt 362.6 lb

## 2023-07-16 DIAGNOSIS — I4819 Other persistent atrial fibrillation: Secondary | ICD-10-CM

## 2023-07-16 DIAGNOSIS — I4892 Unspecified atrial flutter: Secondary | ICD-10-CM | POA: Diagnosis present

## 2023-07-16 DIAGNOSIS — E118 Type 2 diabetes mellitus with unspecified complications: Secondary | ICD-10-CM

## 2023-07-16 DIAGNOSIS — E669 Obesity, unspecified: Secondary | ICD-10-CM | POA: Insufficient documentation

## 2023-07-16 DIAGNOSIS — Z7901 Long term (current) use of anticoagulants: Secondary | ICD-10-CM | POA: Insufficient documentation

## 2023-07-16 DIAGNOSIS — E0590 Thyrotoxicosis, unspecified without thyrotoxic crisis or storm: Secondary | ICD-10-CM | POA: Diagnosis not present

## 2023-07-16 DIAGNOSIS — I428 Other cardiomyopathies: Secondary | ICD-10-CM | POA: Diagnosis not present

## 2023-07-16 DIAGNOSIS — I480 Paroxysmal atrial fibrillation: Secondary | ICD-10-CM | POA: Diagnosis present

## 2023-07-16 DIAGNOSIS — I130 Hypertensive heart and chronic kidney disease with heart failure and stage 1 through stage 4 chronic kidney disease, or unspecified chronic kidney disease: Secondary | ICD-10-CM | POA: Diagnosis not present

## 2023-07-16 DIAGNOSIS — I483 Typical atrial flutter: Secondary | ICD-10-CM

## 2023-07-16 DIAGNOSIS — Z79899 Other long term (current) drug therapy: Secondary | ICD-10-CM | POA: Diagnosis not present

## 2023-07-16 DIAGNOSIS — Z89411 Acquired absence of right great toe: Secondary | ICD-10-CM | POA: Insufficient documentation

## 2023-07-16 DIAGNOSIS — G4733 Obstructive sleep apnea (adult) (pediatric): Secondary | ICD-10-CM

## 2023-07-16 DIAGNOSIS — I10 Essential (primary) hypertension: Secondary | ICD-10-CM

## 2023-07-16 DIAGNOSIS — Z6839 Body mass index (BMI) 39.0-39.9, adult: Secondary | ICD-10-CM | POA: Insufficient documentation

## 2023-07-16 DIAGNOSIS — E1122 Type 2 diabetes mellitus with diabetic chronic kidney disease: Secondary | ICD-10-CM | POA: Diagnosis not present

## 2023-07-16 DIAGNOSIS — I5022 Chronic systolic (congestive) heart failure: Secondary | ICD-10-CM

## 2023-07-16 DIAGNOSIS — N1832 Chronic kidney disease, stage 3b: Secondary | ICD-10-CM | POA: Diagnosis not present

## 2023-07-16 DIAGNOSIS — I2510 Atherosclerotic heart disease of native coronary artery without angina pectoris: Secondary | ICD-10-CM

## 2023-07-16 DIAGNOSIS — E785 Hyperlipidemia, unspecified: Secondary | ICD-10-CM | POA: Insufficient documentation

## 2023-07-16 DIAGNOSIS — Z8673 Personal history of transient ischemic attack (TIA), and cerebral infarction without residual deficits: Secondary | ICD-10-CM | POA: Diagnosis not present

## 2023-07-16 DIAGNOSIS — H35039 Hypertensive retinopathy, unspecified eye: Secondary | ICD-10-CM | POA: Insufficient documentation

## 2023-07-16 LAB — BASIC METABOLIC PANEL
Anion gap: 10 (ref 5–15)
BUN: 24 mg/dL — ABNORMAL HIGH (ref 6–20)
CO2: 23 mmol/L (ref 22–32)
Calcium: 8.9 mg/dL (ref 8.9–10.3)
Chloride: 103 mmol/L (ref 98–111)
Creatinine, Ser: 1.9 mg/dL — ABNORMAL HIGH (ref 0.61–1.24)
GFR, Estimated: 42 mL/min — ABNORMAL LOW (ref >60)
Glucose, Bld: 107 mg/dL — ABNORMAL HIGH (ref 70–99)
Potassium: 3.6 mmol/L (ref 3.5–5.1)
Sodium: 136 mmol/L (ref 135–145)

## 2023-07-16 LAB — CBC
HCT: 44.2 % (ref 39.0–52.0)
Hemoglobin: 14 g/dL (ref 13.0–17.0)
MCH: 24.2 pg — ABNORMAL LOW (ref 26.0–34.0)
MCHC: 31.7 g/dL (ref 30.0–36.0)
MCV: 76.3 fL — ABNORMAL LOW (ref 80.0–100.0)
Platelets: 333 10*3/uL (ref 150–400)
RBC: 5.79 MIL/uL (ref 4.22–5.81)
RDW: 19.1 % — ABNORMAL HIGH (ref 11.5–15.5)
WBC: 9.1 10*3/uL (ref 4.0–10.5)
nRBC: 0 % (ref 0.0–0.2)

## 2023-07-16 LAB — BRAIN NATRIURETIC PEPTIDE: B Natriuretic Peptide: 104.7 pg/mL — ABNORMAL HIGH (ref 0.0–100.0)

## 2023-07-16 MED ORDER — ATORVASTATIN CALCIUM 20 MG PO TABS
20.0000 mg | ORAL_TABLET | Freq: Every day | ORAL | 3 refills | Status: AC
  Filled 2023-07-16: qty 30, 30d supply, fill #0
  Filled 2023-08-03 – 2023-08-13 (×2): qty 30, 30d supply, fill #1
  Filled 2023-09-09: qty 30, 30d supply, fill #2
  Filled 2023-10-02: qty 30, 30d supply, fill #3
  Filled 2023-10-14 – 2023-11-02 (×2): qty 30, 30d supply, fill #4
  Filled 2023-11-09 – 2023-11-30 (×2): qty 30, 30d supply, fill #5
  Filled 2023-12-10 – 2023-12-25 (×2): qty 30, 30d supply, fill #6
  Filled 2024-01-08 – 2024-01-22 (×4): qty 30, 30d supply, fill #7
  Filled 2024-01-27: qty 30, 30d supply, fill #0
  Filled 2024-02-23: qty 30, 30d supply, fill #1
  Filled 2024-03-19 – 2024-03-22 (×2): qty 30, 30d supply, fill #2
  Filled 2024-04-12 – 2024-05-08 (×3): qty 30, 30d supply, fill #3
  Filled 2024-05-08: qty 30, 30d supply, fill #0
  Filled 2024-05-29 – 2024-05-31 (×2): qty 30, 30d supply, fill #1

## 2023-07-16 MED ORDER — ENTRESTO 49-51 MG PO TABS
1.0000 | ORAL_TABLET | Freq: Two times a day (BID) | ORAL | 11 refills | Status: AC
  Filled 2023-07-16: qty 60, 30d supply, fill #0
  Filled 2023-08-03 – 2023-08-13 (×2): qty 60, 30d supply, fill #1
  Filled 2023-09-09: qty 60, 30d supply, fill #2
  Filled 2023-10-02: qty 60, 30d supply, fill #3
  Filled 2023-10-14 – 2023-11-02 (×2): qty 60, 30d supply, fill #4
  Filled 2023-11-09 – 2023-11-30 (×2): qty 60, 30d supply, fill #5
  Filled 2023-12-10 – 2023-12-25 (×2): qty 60, 30d supply, fill #6
  Filled 2024-01-08 – 2024-01-22 (×3): qty 60, 30d supply, fill #7
  Filled 2024-01-27: qty 60, 30d supply, fill #0
  Filled 2024-02-23: qty 60, 30d supply, fill #1
  Filled 2024-03-19 – 2024-03-22 (×2): qty 60, 30d supply, fill #2
  Filled 2024-04-12: qty 60, 30d supply, fill #3
  Filled 2024-05-08 (×3): qty 60, 30d supply, fill #0
  Filled 2024-05-08 (×2): qty 60, 30d supply, fill #3
  Filled 2024-05-29 – 2024-05-31 (×2): qty 60, 30d supply, fill #1

## 2023-07-16 MED ORDER — METOPROLOL SUCCINATE ER 100 MG PO TB24
100.0000 mg | ORAL_TABLET | Freq: Every day | ORAL | 3 refills | Status: AC
  Filled 2023-07-16: qty 30, 30d supply, fill #0
  Filled 2023-08-03 – 2023-08-13 (×2): qty 30, 30d supply, fill #1
  Filled 2023-09-09: qty 30, 30d supply, fill #2
  Filled 2023-10-02: qty 30, 30d supply, fill #3
  Filled 2023-10-14 – 2023-11-02 (×2): qty 30, 30d supply, fill #4
  Filled 2023-11-09 – 2023-11-30 (×2): qty 30, 30d supply, fill #5
  Filled 2023-12-10 – 2023-12-25 (×2): qty 30, 30d supply, fill #6
  Filled 2024-01-08 – 2024-01-22 (×4): qty 30, 30d supply, fill #7
  Filled 2024-01-27: qty 30, 30d supply, fill #0
  Filled 2024-02-23: qty 30, 30d supply, fill #1
  Filled 2024-03-19 – 2024-03-22 (×2): qty 30, 30d supply, fill #2
  Filled 2024-04-12 – 2024-05-08 (×3): qty 30, 30d supply, fill #3
  Filled 2024-05-08: qty 30, 30d supply, fill #0
  Filled 2024-05-29 – 2024-05-31 (×2): qty 30, 30d supply, fill #1

## 2023-07-16 NOTE — Progress Notes (Signed)
 [facility] Note  PCP: [facility]  Cardiologist: Dr. Gala Romney  HPI: [name] is a 53 year old with a history of HFrEF, paroxsymal A fib/atrial flutter s/p ablation with PVI w/ box lesion, HTN, hypertensive retinopathy, CKD Stage IIIa, hyperlipidemia, DMII, Bells palsy, CVA, and OSA.    Admitted in 12/02/2019 with newly diagnosed A fib + ischemic CVA. He did not meet criteria for IV tPA. MRI showed large acute focal infarct, left frontal lobe predominantly ACA distribution. CT angiogram of the head and neck performed revealing no significant carotid, vertebral or intracranial stenosis. Echocardiogram obtained with bubble study showing no cardiac etiology for stroke. LVEF was 40 to 45%.   Admitted 10/22 with A/C HFrEF and A fib RVR.  Echo showed EF down to 30-35% with speckled appearance concerning for amyloid. Once diuresed and on Iv amio, had TEE-DC-CV with restoration of NSR. RHC/LHC with min obs CAD, elevated filling pressures and normal cardiac output.  Diuresed 40 pounds. Discharge weight 367 pound.  Echo 3/23 EF 55% severe LVH.  Admitted 5/23 with osteomyelitis R great toe and cellulitis RLE. S/p R first ray amputation + placement wound VAC. Wound later dehisced and underwent surgical debridement 6/23.   Amiodarone stopped in 8/23 due to hyperthyroidism. Started on methimazole and prednisone.    He underwent Afib/flutter ablation with PVI/box lesion ablation in 06/24. TEE at that time with EF 25-30% in setting of AF with RVR.  In AFL with RVR 8/24. He was seen in ED and EP consulted. He was off metoprolol. Toprol Xl restarted.   Echo 01/15/23: EF 50-55%, severe LVH, RV mildly reduced, severe LAE  Admitted 9/24 with sepsis secondary to RLE cellulitis. He remained in Afib/flutter with RVR. Seen by EP, beta blocker increased and cardizem added. Plan for repeat ablation as outpatient. Unfortunately, he was admitted again in 10/24 d/t RLE cellulitis with abscess.  He was treated with IV abx and did not require surgical intervention.  S/p AF ablation 3/25.  Today he returns for HF follow up. Overall feeling fine. Walked a long ways downtown today without dyspnea. Has chronic RLE swelling, follows with Dr. Lajoyce Corners. Takes Lasix a couple times a month. Denies palpitations, abnormal bleeding, CP, dizziness, or PND/Orthopnea. Appetite ok. No fever or chills. Off several meds for unclear reasons. 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 BP at home 140/112   Cardiac Testing - Echo 9/24: EF 50-55%  - Echo (3/23): EF 55%, severe LVH  - Echo (10/22): EF 30-35% specked appearance concerning for amyloid. RV normal. LA severely dilated. Severe LVH.   - Echo (8/21): EF 40-45% and severe LVH   - R/LHC (10/22)  Ao = 163/108 (134) LV = 136/25 RA =  15 RV = 51/13 PA = 57/19 (39) PCW = 28 Fick cardiac output/index = 9.2/3.0 PVR = 1.2 WU Ao sat = 94% PA sat = 65%, 65%    1. Mild non-obstructive CAD 2. NICM (likely hypertensive) with EF 35% by echo 3. Elevated filling pressures with normal CO 4. Severe systemic HTN  ROS: All systems negative except as listed in HPI, PMH and Problem List.  SH:  Social History   Socioeconomic History   Marital status: Married    Spouse name: Randy [name]   Number of children: 3   Years of education: Not on file   Highest education level: Bachelor's degree (e.g., BA, AB, BS)  Occupational History   Occupation: Tax  Collector    Comment: [place]  Tobacco Use   Smoking status: Former    Types: Cigars   Smokeless tobacco: Never   Tobacco comments:    Former smoker 01/01/23  Vaping Use   Vaping status: Never Used  Substance and Sexual Activity   Alcohol use: Not Currently    Comment: stop drinking 01/01/23   Drug use: No   Sexual activity: Not on file  Other Topics Concern   Not on file  Social History Narrative   Newly married, 2012, 1 daughter, 1 son   No  injectable steroid cycles   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, 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)   Regular exercise-yes   Social Drivers of Health   Financial Resource Strain: Low Risk  (01/27/2021)   Overall Financial Resource Strain (CARDIA)    Difficulty of Paying Living Expenses: Not hard at all  Food Insecurity: No Food Insecurity (02/11/2023)   Hunger Vital Sign    Worried About Running Out of Food in the Last Year: Never true    Ran Out of Food in the Last Year: Never true  Transportation Needs: No Transportation Needs (02/11/2023)   PRAPARE - Administrator, Civil Service (Medical): No    Lack of Transportation (Non-Medical): No  Physical Activity: Not on file  Stress: Not on file  Social Connections: Unknown (09/06/2021)   Received from [facility]   Social Network    Social Network: Not on file  Intimate Partner Violence: Not At Risk (02/11/2023)   Humiliation, Afraid, Rape, and Kick questionnaire    Fear of Current or Ex-Partner: No    Emotionally Abused: No    Physically Abused: No    Sexually Abused: No   FH:  Family History  Problem Relation Age of Onset   Hypertension Mother    Hypertension Father    Diabetes Father    Diabetes Sister    Hypertension Brother    Kidney disease Paternal Grandmother        ESRD   Coronary artery disease Neg Hx    Stroke Neg Hx    Cancer Neg Hx    Past Medical History:  Diagnosis Date   CKD (chronic kidney disease) stage 3, GFR 30-59 ml/min (HCC)    baseline Cr 1.5-1.7   Decreased visual acuity    Left eye, resolved - hypertensive retinopathy   Diabetes mellitus 2012   Type II   Hilar adenopathy    on CT scan 02/2010, on rpt scan stable/improved.   History of Bell's palsy 12/2007   history, Left   History of headache    HTN (hypertension), malignant    previously on BC and goody powders for HA   Internal derangement of knee 12/2007   Left   Microalbuminuria    Morbid  obesity (HCC)    Stroke (HCC)    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HCC)    echo 2011 with nonischemic hypertensive cardiomyopathy   Systolic murmur    Vitamin D deficiency    Current Outpatient Medications  Medication Sig Dispense Refill   apixaban (ELIQUIS) 5 MG TABS tablet Take 1 tablet (5 mg total) by mouth 2 (two) times daily. 180 tablet 3   diltiazem (CARDIZEM CD) 360 MG 24 hr capsule Take 1 capsule (360 mg total) by mouth daily. 90 capsule 2   empagliflozin (JARDIANCE) 10 MG TABS tablet Take 1 tablet by mouth once daily 90 tablet 1   furosemide (LASIX) 20 MG  tablet Take 1 tablet (20 mg total) by mouth as needed. 30 tablet 0   isosorbide-hydrALAZINE (BIDIL) 20-37.5 MG tablet Take 2 tablets by mouth 3 (three) times daily. 545 tablet 3   losartan (COZAAR) 25 MG tablet Take 1 tablet (25 mg total) by mouth daily. 90 tablet 3   tirzepatide (MOUNJARO) 12.5 MG/0.5ML Pen Inject 12.5 mg into the skin once a week. (Patient taking differently: Inject 12.5 mg into the skin once a week. On Thursadays) 6 mL 3   metoprolol succinate (TOPROL-XL) 100 MG 24 hr tablet Take 1 tablet (100 mg total) by mouth at bedtime. Take with or immediately following a meal. (Patient not taking: Reported on 07/16/2023) 30 tablet 6   No current facility-administered medications for this encounter.   BP (!) 170/113 Comment: left arm  Pulse 82   Wt (!) 164.5 kg (362 lb 9.6 oz)   SpO2 98%   BMI 39.83 kg/m   Wt Readings from Last 3 Encounters:  07/16/23 (!) 164.5 kg (362 lb 9.6 oz)  07/08/23 (!) 167.8 kg (370 lb)  05/17/23 (!) 165.3 kg (364 lb 6.4 oz)   PHYSICAL EXAM: General:  NAD. No resp difficulty, walked into clinic HEENT: Normal Neck: Supple. No JVD. Cor: Regular rate & rhythm. No rubs, gallops or murmurs. Lungs: Clear Abdomen: Soft, obese, nontender, nondistended.  Extremities: No cyanosis, clubbing, rash, 1-2+ RLE edema Neuro: Alert & oriented x 3, moves all 4 extremities w/o difficulty. Affect pleasant.  ASSESSMENT &  PLAN: 1. Chronic Systolic Heart Failure - NICM. LHC w/ mild nonobstructive CAD. Suspect HTN cardiomyopathy.  - Echo 01/31/21 EF 30-35% with severe LVH. Doubt amyloid with uncontrolled hypertension however if EF drops again will need cMRI if he fits - Echo 3/23 EF 55-60% (recovered EF) - Echo 12/23 EF 55-60% - TEE 6/24: EF 25-30% in setting of AF with RVR - Afib/flutter ablation 6/24 - Echo 9/24: EF 50-55%, severe LVH, RV mildly reduced - NYHA II. Volume looks ok, RLE edema is chronic. - Stop losartan - Restart Entresto 49/51 mg bid - Restart Toprol XL 100 mg daily. - Continue Jardiance 10 mg daily.  - Continue Bidil 2 tab tid.  - Add spiro next - Labs today.  2. PAF/AFL - S/P TEE/DC-CV (10/22) with restoration NSR  - Recurrent AF 3/23 s/p DCCV - Back in AF after amio stopped in 8/23 due to hyperthyroidism  - Afib/flutter ablation 06/24 - s/p AF ablation 3/25 - Continue Eliquis 5 mg bid. No bleeding issues - Needs to use CPAP (see below) - CBC today   3. Hypertension  - BP elevated - Med changes as above - Check BP at home and log - Labs today - Needs to use CPAP (see below)   4. CKD Stage IIIb  - Baseline SCr 1.7-2.0 - He has been referred to Nephrology - Continue SGLT2i - Labs today.   5. DMII - Last A1c 6.4% - Continue Jardiance and Mounjaro - Follows with Endocrinology.    6. OSA - Needs setting adjusted, not wearing - Refer to Dr. Mayford Knife   7. CAD - Mild nonobstructive on cath - No chest pain.  - Restart atorvastatin 20 mg daily. - No ASA w/ Eliquis.  - LFTs/lipids in 6-8 weeks  8. S/p R great toe amputation - s/p R LE Osteomyelitis/R lower leg cellulitis, required Wound VAC 5/23. - 2 admissions with R lower extremity cellulitis/sepsis.  - Resolved.  9. Obesity - Body mass index is 39.83 kg/m. - Now on  Mounjaro   10. Hyperthyroidism - felt to be due to amio=>amio stopped - Methimazole and prednisone started 12/23, now off - Sees Dr.  Lafe Garin  Given list of PCP today to establish care.  Follow up in 3 weeks with APP (BP check, will need BMET).   Jacklynn Ganong, FNP  3:11 PM

## 2023-07-16 NOTE — Patient Instructions (Addendum)
 STOP losartan  START Entresto 49/51 mg Twice daily  RESTART Toprol XL 100 mg daily  RESTART atorvastatin 20 mg daily  Labs done today, your results will be available in MyChart, we will contact you for abnormal readings.  YOU HAVE BEEN REFERRED TO DR. Malachy Mood OFFICE.they will call you to arrange our appointment.  Your physician recommends that you schedule a follow-up appointment in: 3 weeks.  If you have any questions or concerns before your next appointment please send [place] a message through [place] or call our office at [phone].    TO LEAVE A MESSAGE FOR THE NURSE SELECT OPTION 2, PLEASE LEAVE A MESSAGE INCLUDING: YOUR NAME DATE OF BIRTH CALL BACK NUMBER REASON FOR CALL**this is important as we prioritize the call backs  YOU WILL RECEIVE A CALL BACK THE SAME DAY AS LONG AS YOU CALL BEFORE 4:00 PM  At the [facility], you and your health needs are our priority. As part of our continuing mission to provide you with exceptional heart care, we have created designated Provider Care Teams. These Care Teams include your primary Cardiologist (physician) and Advanced Practice Providers (APPs- Physician Assistants and Nurse Practitioners) who all work together to provide you with the care you need, when you need it.   You may see any of the following providers on your designated Care Team at your next follow up: Dr Arvilla Meres Dr Marca Ancona Dr. Dorthula Nettles Dr. Clearnce Hasten Amy Filbert Schilder, NP Robbie Lis, [place] [facility] [place], [place] Brynda Peon, NP [place] Lee, NP Clarisa Kindred, NP Karle Plumber, PharmD Enos Fling, PharmD   Please be sure to bring in all your medications bottles to every appointment.    Thank you for choosing [facility]

## 2023-07-19 ENCOUNTER — Other Ambulatory Visit (HOSPITAL_COMMUNITY): Payer: Self-pay

## 2023-07-21 ENCOUNTER — Other Ambulatory Visit (HOSPITAL_COMMUNITY): Payer: Self-pay

## 2023-08-03 ENCOUNTER — Other Ambulatory Visit: Payer: Self-pay

## 2023-08-03 ENCOUNTER — Other Ambulatory Visit (HOSPITAL_COMMUNITY): Payer: Self-pay

## 2023-08-03 ENCOUNTER — Other Ambulatory Visit (HOSPITAL_COMMUNITY): Payer: Self-pay | Admitting: Physician Assistant

## 2023-08-03 MED ORDER — FUROSEMIDE 20 MG PO TABS
20.0000 mg | ORAL_TABLET | ORAL | 3 refills | Status: DC | PRN
  Filled 2023-08-03 – 2023-08-09 (×2): qty 30, 30d supply, fill #0
  Filled 2023-08-13 – 2023-09-09 (×2): qty 30, 30d supply, fill #1
  Filled 2023-10-02 – 2023-10-14 (×2): qty 30, 30d supply, fill #2
  Filled 2023-11-02 – 2023-11-09 (×2): qty 30, 30d supply, fill #3

## 2023-08-04 NOTE — Progress Notes (Signed)
 [facility] Note  PCP: [facility]  Cardiologist: Dr. Gala Romney  HPI: Randy [name] is a 53 year old with a history of HFrEF, paroxsymal A fib/atrial flutter s/p ablation with PVI w/ box lesion, HTN, hypertensive retinopathy, CKD Stage IIIa, hyperlipidemia, DMII, Bells palsy, CVA, and OSA.    Admitted in 12/02/2019 with newly diagnosed A fib + ischemic CVA. He did not meet criteria for IV tPA. MRI showed large acute focal infarct, left frontal lobe predominantly ACA distribution. CT angiogram of the head and neck performed revealing no significant carotid, vertebral or intracranial stenosis. Echocardiogram obtained with bubble study showing no cardiac etiology for stroke. LVEF was 40 to 45%.   Admitted 10/22 with A/C HFrEF and A fib RVR.  Echo showed EF down to 30-35% with speckled appearance concerning for amyloid. Once diuresed and on Iv amio, had TEE-DC-CV with restoration of NSR. RHC/LHC with min obs CAD, elevated filling pressures and normal cardiac output.  Diuresed 40 pounds. Discharge weight 367 pound.  Echo 3/23 EF 55% severe LVH.  Admitted 5/23 with osteomyelitis R great toe and cellulitis RLE. S/p R first ray amputation + placement wound VAC. Wound later dehisced and underwent surgical debridement 6/23.   Amiodarone stopped in 8/23 due to hyperthyroidism. Started on methimazole and prednisone.    He underwent Afib/flutter ablation with PVI/box lesion ablation in 06/24. TEE at that time with EF 25-30% in setting of AF with RVR.  In AFL with RVR 8/24. He was seen in ED and EP consulted. He was off metoprolol. Toprol Xl restarted.   Echo 01/15/23: EF 50-55%, severe LVH, RV mildly reduced, severe LAE  Admitted 9/24 with sepsis secondary to RLE cellulitis. He remained in Afib/flutter with RVR. Seen by EP, beta blocker increased and cardizem added. Plan for repeat ablation as outpatient. Unfortunately, he was admitted again in 10/24 d/t RLE cellulitis with abscess.  He was treated with IV abx and did not require surgical intervention.  S/p AF ablation 3/25.  Today he returns for HF follow up. Overall feeling fine. Walked a long ways downtown today without dyspnea. Has chronic RLE swelling, follows with Dr. Lajoyce Corners. Takes Lasix a couple times a month. Denies palpitations, abnormal bleeding, CP, dizziness, or PND/Orthopnea. Appetite ok. No fever or chills. Off several meds for unclear reasons. 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 BP at home 140/112   Cardiac Testing - Echo 9/24: EF 50-55%  - Echo (3/23): EF 55%, severe LVH  - Echo (10/22): EF 30-35% specked appearance concerning for amyloid. RV normal. LA severely dilated. Severe LVH.   - Echo (8/21): EF 40-45% and severe LVH   - R/LHC (10/22)  Ao = 163/108 (134) LV = 136/25 RA =  15 RV = 51/13 PA = 57/19 (39) PCW = 28 Fick cardiac output/index = 9.2/3.0 PVR = 1.2 WU Ao sat = 94% PA sat = 65%, 65%    1. Mild non-obstructive CAD 2. NICM (likely hypertensive) with EF 35% by echo 3. Elevated filling pressures with normal CO 4. Severe systemic HTN  ROS: All systems negative except as listed in HPI, PMH and Problem List.  SH:  Social History   Socioeconomic History   Marital status: Married    Spouse name: Randy [name]   Number of children: 3   Years of education: Not on file   Highest education level: Bachelor's degree (e.g., BA, AB, BS)  Occupational History   Occupation: Tax  Collector    Comment: [place]  Tobacco Use   Smoking status: Former    Types: Cigars   Smokeless tobacco: Never   Tobacco comments:    Former smoker 01/01/23  Vaping Use   Vaping status: Never Used  Substance and Sexual Activity   Alcohol use: Not Currently    Comment: stop drinking 01/01/23   Drug use: No   Sexual activity: Not on file  Other Topics Concern   Not on file  Social History Narrative   Newly married, 2012, 1 daughter, 1 son   No  injectable steroid cycles   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, 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)   Regular exercise-yes   Social Drivers of Health   Financial Resource Strain: Low Risk  (01/27/2021)   Overall Financial Resource Strain (CARDIA)    Difficulty of Paying Living Expenses: Not hard at all  Food Insecurity: No Food Insecurity (02/11/2023)   Hunger Vital Sign    Worried About Running Out of Food in the Last Year: Never true    Ran Out of Food in the Last Year: Never true  Transportation Needs: No Transportation Needs (02/11/2023)   PRAPARE - Administrator, Civil Service (Medical): No    Lack of Transportation (Non-Medical): No  Physical Activity: Not on file  Stress: Not on file  Social Connections: Unknown (09/06/2021)   Received from [facility]   Social Network    Social Network: Not on file  Intimate Partner Violence: Not At Risk (02/11/2023)   Humiliation, Afraid, Rape, and Kick questionnaire    Fear of Current or Ex-Partner: No    Emotionally Abused: No    Physically Abused: No    Sexually Abused: No   FH:  Family History  Problem Relation Age of Onset   Hypertension Mother    Hypertension Father    Diabetes Father    Diabetes Sister    Hypertension Brother    Kidney disease Paternal Grandmother        ESRD   Coronary artery disease Neg Hx    Stroke Neg Hx    Cancer Neg Hx    Past Medical History:  Diagnosis Date   CKD (chronic kidney disease) stage 3, GFR 30-59 ml/min (HCC)    baseline Cr 1.5-1.7   Decreased visual acuity    Left eye, resolved - hypertensive retinopathy   Diabetes mellitus 2012   Type II   Hilar adenopathy    on CT scan 02/2010, on rpt scan stable/improved.   History of Bell's palsy 12/2007   history, Left   History of headache    HTN (hypertension), malignant    previously on BC and goody powders for HA   Internal derangement of knee 12/2007   Left   Microalbuminuria    Morbid  obesity (HCC)    Stroke (HCC)    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HCC)    echo 2011 with nonischemic hypertensive cardiomyopathy   Systolic murmur    Vitamin D deficiency    Current Outpatient Medications  Medication Sig Dispense Refill   apixaban (ELIQUIS) 5 MG TABS tablet Take 1 tablet (5 mg total) by mouth 2 (two) times daily. 180 tablet 3   atorvastatin (LIPITOR) 20 MG tablet Take 1 tablet (20 mg total) by mouth daily. 90 tablet 3   diltiazem (CARDIZEM CD) 360 MG 24 hr capsule Take 1 capsule (360 mg total) by mouth daily. 90 capsule 2   empagliflozin (JARDIANCE) 10  MG TABS tablet Take 1 tablet by mouth once daily 90 tablet 1   furosemide (LASIX) 20 MG tablet Take 1 tablet (20 mg total) by mouth as needed. 30 tablet 3   isosorbide-hydrALAZINE (BIDIL) 20-37.5 MG tablet Take 2 tablets by mouth 3 (three) times daily. 545 tablet 3   metoprolol succinate (TOPROL-XL) 100 MG 24 hr tablet Take 1 tablet (100 mg total) by mouth at bedtime. Take with or immediately following a meal. 90 tablet 3   sacubitril-valsartan (ENTRESTO) 49-51 MG Take 1 tablet by mouth 2 (two) times daily. 60 tablet 11   tirzepatide (MOUNJARO) 12.5 MG/0.5ML Pen Inject 12.5 mg into the skin once a week. (Patient taking differently: Inject 12.5 mg into the skin once a week. On Thursadays) 6 mL 3   No current facility-administered medications for this visit.   There were no vitals taken for this visit.  Wt Readings from Last 3 Encounters:  07/16/23 (!) 164.5 kg (362 lb 9.6 oz)  07/08/23 (!) 167.8 kg (370 lb)  05/17/23 (!) 165.3 kg (364 lb 6.4 oz)   PHYSICAL EXAM: General:  NAD. No resp difficulty, walked into clinic HEENT: Normal Neck: Supple. No JVD. Cor: Regular rate & rhythm. No rubs, gallops or murmurs. Lungs: Clear Abdomen: Soft, obese, nontender, nondistended.  Extremities: No cyanosis, clubbing, rash, 1-2+ RLE edema Neuro: Alert & oriented x 3, moves all 4 extremities w/o difficulty. Affect pleasant.  ASSESSMENT & PLAN: 1.  Chronic Systolic Heart Failure - NICM. LHC w/ mild nonobstructive CAD. Suspect HTN cardiomyopathy.  - Echo 01/31/21 EF 30-35% with severe LVH. Doubt amyloid with uncontrolled hypertension however if EF drops again will need cMRI if he fits - Echo 3/23 EF 55-60% (recovered EF) - Echo 12/23 EF 55-60% - TEE 6/24: EF 25-30% in setting of AF with RVR - Afib/flutter ablation 6/24 - Echo 9/24: EF 50-55%, severe LVH, RV mildly reduced - NYHA II. Volume looks ok, RLE edema is chronic. - Stop losartan - Restart Entresto 49/51 mg bid - Restart Toprol XL 100 mg daily. - Continue Jardiance 10 mg daily.  - Continue Bidil 2 tab tid.  - Add spiro next - Labs today.  2. PAF/AFL - S/P TEE/DC-CV (10/22) with restoration NSR  - Recurrent AF 3/23 s/p DCCV - Back in AF after amio stopped in 8/23 due to hyperthyroidism  - Afib/flutter ablation 06/24 - s/p AF ablation 3/25 - Continue Eliquis 5 mg bid. No bleeding issues - Needs to use CPAP (see below) - CBC today   3. Hypertension  - BP elevated - Med changes as above - Check BP at home and log - Labs today - Needs to use CPAP (see below)   4. CKD Stage IIIb  - Baseline SCr 1.7-2.0 - He has been referred to Nephrology - Continue SGLT2i - Labs today.   5. DMII - Last A1c 6.4% - Continue Jardiance and Mounjaro - Follows with Endocrinology.    6. OSA - Needs setting adjusted, not wearing - Refer to Dr. Mayford Knife   7. CAD - Mild nonobstructive on cath - No chest pain.  - Restart atorvastatin 20 mg daily. - No ASA w/ Eliquis.  - LFTs/lipids in 6-8 weeks  8. S/p R great toe amputation - s/p R LE Osteomyelitis/R lower leg cellulitis, required Wound VAC 5/23. - 2 admissions with R lower extremity cellulitis/sepsis.  - Resolved.  9. Obesity - There is no height or weight on file to calculate BMI. - Now on Mounjaro   10.  Hyperthyroidism - felt to be due to amio=>amio stopped - Methimazole and prednisone started 12/23, now off - Sees Dr.  Lafe Garin  Given list of PCP today to establish care.  Follow up in 3 weeks with APP (BP check, will need BMET).   Randy Ganong, FNP  3:11 PM

## 2023-08-05 ENCOUNTER — Ambulatory Visit (HOSPITAL_COMMUNITY)
Admission: RE | Admit: 2023-08-05 | Discharge: 2023-08-05 | Disposition: A | Discharge status: Home / Self Care | Source: Ambulatory Visit | Attending: Internal Medicine | Admitting: Internal Medicine

## 2023-08-05 VITALS — BP 152/98 | HR 73 | Ht >= 80 in | Wt 368.8 lb

## 2023-08-05 DIAGNOSIS — I5022 Chronic systolic (congestive) heart failure: Secondary | ICD-10-CM | POA: Insufficient documentation

## 2023-08-05 DIAGNOSIS — I4819 Other persistent atrial fibrillation: Secondary | ICD-10-CM | POA: Diagnosis not present

## 2023-08-05 DIAGNOSIS — Z7901 Long term (current) use of anticoagulants: Secondary | ICD-10-CM | POA: Diagnosis not present

## 2023-08-05 DIAGNOSIS — I4892 Unspecified atrial flutter: Secondary | ICD-10-CM | POA: Diagnosis present

## 2023-08-05 DIAGNOSIS — I130 Hypertensive heart and chronic kidney disease with heart failure and stage 1 through stage 4 chronic kidney disease, or unspecified chronic kidney disease: Secondary | ICD-10-CM | POA: Diagnosis not present

## 2023-08-05 DIAGNOSIS — E0590 Thyrotoxicosis, unspecified without thyrotoxic crisis or storm: Secondary | ICD-10-CM | POA: Insufficient documentation

## 2023-08-05 DIAGNOSIS — D6869 Other thrombophilia: Secondary | ICD-10-CM | POA: Diagnosis not present

## 2023-08-05 NOTE — Progress Notes (Signed)
 Primary Care Physician: Pcp, No Primary Cardiologist: Dr Gala Romney  Primary Electrophysiologist: Dr Elberta Fortis Referring Physician: Dr Hardin [name] is a 53 y.o. male with a history of HFrEF, HTN, CKD, HLD, DM, CVA, OSA, CAD, atrial fibrillation who presents for follow up in the [facility]. He was admitted in 12/02/2019 with newly diagnosed A fib + ischemic CVA. He did not meet criteria for IV tPA. MRI showed large acute focal infarct, left frontal lobe predominantly ACA distribution. Echocardiogram obtained with bubble study showing no cardiac etiology for stroke. LVEF was 40 to 45%. Placed on Toprol XL and Eliquis for a CHDAS2VASC score of 6.    Followed at [facility] for cardiology services. Saw Cyndia Diver on 12/12/2019. Set up for sleep study.    Admitted 10/22 with A/C HFrEF and A fib RVR.  Echo showed EF down to 30-35% with speckled appears concerning for amyloid. Diuresed with IV lasix and placed on Amio drip. Once diuresed had TEE-DC-CV with restoration of NSR. Had RHC/LHC with min obs CAD, elevated filling pressures and normal cardiac output.  Diuresed 40 pounds. Repeat echo 3/23 showed EF 55% severe LVH.   His amiodarone was stopped 8/23 due to elevated thyroid tests. Started on methimazole 5 bid and prednisone. He saw Dr Gala Romney 04/22/22 and was found to be back in afib. Patient is s/p afib ablation with Dr Elberta Fortis on 10/02/22.  Patient was seen at the ED 12/24/22 with chest pain. He was cardioverted and maintained NSR for ~ 20 minutes, but then reverted to AFL. Has since transitioned to what appears to be course fib vs continued flutter.   On follow up today, patient has converted to SR since discharge. He feels well today. No bleeding issues on anticoagulation. He is not sure how metoprolol was discontinued.   On follow up 08/05/23, patient is currently in NSR. S/p Afib ablation on 07/08/23 by Dr. Elberta Fortis. No episodes of Afib since ablation.  No chest pain or SOB. Leg sites healed without issue. No missed doses of Eliquis 5 mg BID.  Today, he denies symptoms of orthopnea, PND, lower extremity edema, dizziness, presyncope, syncope, snoring, daytime somnolence, bleeding, or neurologic sequela. The patient is tolerating medications without difficulties and is otherwise without complaint today.     Atrial Fibrillation Risk Factors:  he does have symptoms or diagnosis of sleep apnea. he is compliant with CPAP therapy. he does not have a history of rheumatic fever.   Atrial Fibrillation Management history:  Previous antiarrhythmic drugs: amiodarone  Previous cardioversions: 01/2021, 06/2021, 12/24/22 Previous ablations: 10/02/22, 07/08/23 Anticoagulation history: Eliquis   Past Medical History:  Diagnosis Date   CKD (chronic kidney disease) stage 3, GFR 30-59 ml/min (HCC)    baseline Cr 1.5-1.7   Decreased visual acuity    Left eye, resolved - hypertensive retinopathy   Diabetes mellitus 2012   Type II   Hilar adenopathy    on CT scan 02/2010, on rpt scan stable/improved.   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  Physical Exam: Vitals:   08/05/23 1134  BP: (!) 152/98  Pulse: 73  Weight: (!) 167.3 kg  Height: 6\' 8"  (2.032 m)    GEN- The patient is well appearing, alert and oriented x 3 today.   Neck - no JVD or carotid bruit noted Lungs- Clear to ausculation bilaterally, normal work of breathing Heart- Regular rate and rhythm, no murmurs, rubs or gallops, PMI not laterally displaced Extremities- no  clubbing, cyanosis, or edema Skin - no rash or ecchymosis noted   Wt Readings from Last 3 Encounters:  08/05/23 (!) 167.3 kg  07/16/23 (!) 164.5 kg  07/08/23 (!) 167.8 kg    EKG today demonstrates  Vent. rate 73 BPM PR interval 236 ms QRS duration 104 ms QT/QTcB 432/475 ms P-R-T axes 48 9 76 Sinus rhythm with 1st degree A-V block Lateral infarct , age undetermined Abnormal ECG When compared with ECG of 08-Jul-2023 11:44, PREVIOUS ECG IS PRESENT  Echo 01/15/23 demonstrated   1. Left ventricular ejection fraction, by estimation, is 50 to 55%. The  left ventricle has low normal function. The left ventricle has no regional  wall motion abnormalities. The left ventricular internal cavity size was  mildly dilated. There is severe  concentric left ventricular hypertrophy. Left ventricular diastolic  function could not be evaluated.   2. Right ventricular systolic function is mildly reduced. The right  ventricular size is mildly enlarged. There is mildly elevated pulmonary  artery systolic pressure. The estimated right ventricular systolic  pressure is 34.4 mmHg.   3. Left atrial size was severely dilated.   4. Right atrial size was mildly dilated.   5. The mitral valve is normal in structure. No evidence of mitral valve  regurgitation. No evidence of mitral stenosis.   6. The aortic valve is tricuspid. Aortic valve regurgitation is not  visualized. No aortic stenosis is present.   7. There is mild dilatation of the ascending aorta, measuring 42 mm.   8. The inferior vena cava is dilated in size with >50% respiratory  variability, suggesting right atrial pressure of 8 mmHg.   Epic records are reviewed at length today  CHA2DS2-VASc Score = 6  The patient's score is based upon: CHF History: 1 HTN History: 1 Diabetes History: 1 Stroke History: 2 Vascular Disease History: 1 Age Score: 0 Gender Score: 0       ASSESSMENT AND PLAN: Persistent Atrial Fibrillation/atrial  flutter The patient's CHA2DS2-VASc score is 6, indicating a 9.7% annual risk of stroke.   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  S/p afib ablation 10/02/22 S/p afib ablation 07/08/23  He is currently in NSR. Continue diltiazem 360 mg daily. Continue Toprol 100 mg daily.  Secondary Hypercoagulable State (ICD10:  D68.69) The patient is at significant risk for stroke/thromboembolism based upon his CHA2DS2-VASc Score of 6.  Continue Apixaban (Eliquis).  No missed doses.  Chronic HFrEF EF on TEE prior to ablation 25-30%. Repeat echo 12/2022 with improved function.  HTN Elevated today but he was running from parking garage to clinic due to being late for appointment. Advised to monitor BP.     Follow up with EP as scheduled.   Justin Mend, PA-C Afib Clinic [facility] [address] [place], [place] [address] [phone] 08/05/2023 1:51 PM

## 2023-08-06 ENCOUNTER — Encounter (HOSPITAL_COMMUNITY): Payer: Self-pay

## 2023-08-06 ENCOUNTER — Other Ambulatory Visit (HOSPITAL_COMMUNITY): Payer: Self-pay

## 2023-08-06 ENCOUNTER — Ambulatory Visit (HOSPITAL_COMMUNITY)
Admission: RE | Admit: 2023-08-06 | Discharge: 2023-08-06 | Disposition: A | Discharge status: Home / Self Care | Source: Ambulatory Visit | Attending: Family Medicine | Admitting: Family Medicine

## 2023-08-06 VITALS — BP 160/102 | HR 75 | Ht >= 80 in | Wt 374.6 lb

## 2023-08-06 DIAGNOSIS — I4892 Unspecified atrial flutter: Secondary | ICD-10-CM | POA: Insufficient documentation

## 2023-08-06 DIAGNOSIS — Z8673 Personal history of transient ischemic attack (TIA), and cerebral infarction without residual deficits: Secondary | ICD-10-CM | POA: Diagnosis not present

## 2023-08-06 DIAGNOSIS — I10 Essential (primary) hypertension: Secondary | ICD-10-CM | POA: Diagnosis not present

## 2023-08-06 DIAGNOSIS — G4733 Obstructive sleep apnea (adult) (pediatric): Secondary | ICD-10-CM | POA: Diagnosis not present

## 2023-08-06 DIAGNOSIS — I5022 Chronic systolic (congestive) heart failure: Secondary | ICD-10-CM | POA: Insufficient documentation

## 2023-08-06 DIAGNOSIS — Z6841 Body Mass Index (BMI) 40.0 and over, adult: Secondary | ICD-10-CM | POA: Diagnosis not present

## 2023-08-06 DIAGNOSIS — N1832 Chronic kidney disease, stage 3b: Secondary | ICD-10-CM | POA: Diagnosis not present

## 2023-08-06 DIAGNOSIS — I130 Hypertensive heart and chronic kidney disease with heart failure and stage 1 through stage 4 chronic kidney disease, or unspecified chronic kidney disease: Secondary | ICD-10-CM | POA: Insufficient documentation

## 2023-08-06 DIAGNOSIS — I4819 Other persistent atrial fibrillation: Secondary | ICD-10-CM

## 2023-08-06 DIAGNOSIS — E1122 Type 2 diabetes mellitus with diabetic chronic kidney disease: Secondary | ICD-10-CM | POA: Insufficient documentation

## 2023-08-06 DIAGNOSIS — Z7901 Long term (current) use of anticoagulants: Secondary | ICD-10-CM | POA: Diagnosis not present

## 2023-08-06 DIAGNOSIS — E785 Hyperlipidemia, unspecified: Secondary | ICD-10-CM | POA: Insufficient documentation

## 2023-08-06 DIAGNOSIS — I2510 Atherosclerotic heart disease of native coronary artery without angina pectoris: Secondary | ICD-10-CM | POA: Diagnosis not present

## 2023-08-06 DIAGNOSIS — Z79899 Other long term (current) drug therapy: Secondary | ICD-10-CM | POA: Insufficient documentation

## 2023-08-06 DIAGNOSIS — E0590 Thyrotoxicosis, unspecified without thyrotoxic crisis or storm: Secondary | ICD-10-CM | POA: Insufficient documentation

## 2023-08-06 DIAGNOSIS — I480 Paroxysmal atrial fibrillation: Secondary | ICD-10-CM | POA: Diagnosis not present

## 2023-08-06 DIAGNOSIS — E669 Obesity, unspecified: Secondary | ICD-10-CM | POA: Insufficient documentation

## 2023-08-06 DIAGNOSIS — E118 Type 2 diabetes mellitus with unspecified complications: Secondary | ICD-10-CM

## 2023-08-06 DIAGNOSIS — Z89411 Acquired absence of right great toe: Secondary | ICD-10-CM | POA: Insufficient documentation

## 2023-08-06 LAB — BASIC METABOLIC PANEL WITH GFR
Anion gap: 9 (ref 5–15)
BUN: 28 mg/dL — ABNORMAL HIGH (ref 6–20)
CO2: 22 mmol/L (ref 22–32)
Calcium: 8.8 mg/dL — ABNORMAL LOW (ref 8.9–10.3)
Chloride: 108 mmol/L (ref 98–111)
Creatinine, Ser: 1.95 mg/dL — ABNORMAL HIGH (ref 0.61–1.24)
GFR, Estimated: 41 mL/min — ABNORMAL LOW (ref >60)
Glucose, Bld: 88 mg/dL (ref 70–99)
Potassium: 4.4 mmol/L (ref 3.5–5.1)
Sodium: 139 mmol/L (ref 135–145)

## 2023-08-06 LAB — BRAIN NATRIURETIC PEPTIDE: B Natriuretic Peptide: 291 pg/mL — ABNORMAL HIGH (ref 0.0–100.0)

## 2023-08-06 MED ORDER — SPIRONOLACTONE 25 MG PO TABS
12.5000 mg | ORAL_TABLET | Freq: Every day | ORAL | 3 refills | Status: AC
  Filled 2023-08-06: qty 15, 30d supply, fill #0
  Filled 2023-08-13 – 2023-09-05 (×2): qty 15, 30d supply, fill #1
  Filled 2023-09-09 – 2023-10-02 (×2): qty 15, 30d supply, fill #2
  Filled 2023-10-14 – 2023-11-02 (×2): qty 15, 30d supply, fill #3
  Filled 2023-11-09 – 2023-11-30 (×2): qty 15, 30d supply, fill #4
  Filled 2023-12-10 – 2023-12-25 (×2): qty 15, 30d supply, fill #5
  Filled 2024-01-08 – 2024-01-17 (×3): qty 15, 30d supply, fill #6
  Filled 2024-01-27: qty 15, 30d supply, fill #0
  Filled 2024-02-23: qty 15, 30d supply, fill #1
  Filled 2024-03-19 – 2024-03-21 (×2): qty 15, 30d supply, fill #2
  Filled 2024-04-12 – 2024-05-08 (×3): qty 15, 30d supply, fill #3
  Filled 2024-05-08: qty 15, 30d supply, fill #0
  Filled 2024-05-29 – 2024-05-31 (×2): qty 15, 30d supply, fill #1

## 2023-08-06 NOTE — Patient Instructions (Addendum)
 Good to see you today!  Labs done today, your results will be available in MyChart, we will contact you for abnormal readings.REPEAT lab work in a week and 4 weeks as scheduled   START Spironolactone 12.5 mg (1/2 tablet) daily   Your physician recommends that you schedule a follow-up appointment :4 months(August ) Call office in June to schedule an appointment   If you have any questions or concerns before your next appointment please send [place] a message through [place] or call our office at [phone].    TO LEAVE A MESSAGE FOR THE NURSE SELECT OPTION 2, PLEASE LEAVE A MESSAGE INCLUDING: YOUR NAME DATE OF BIRTH CALL BACK NUMBER REASON FOR CALL**this is important as we prioritize the call backs  YOU WILL RECEIVE A CALL BACK THE SAME DAY AS LONG AS YOU CALL BEFORE 4:00 PM At the [facility], you and your health needs are our priority. As part of our continuing mission to provide you with exceptional heart care, we have created designated Provider Care Teams. These Care Teams include your primary Cardiologist (physician) and Advanced Practice Providers (APPs- Physician Assistants and Nurse Practitioners) who all work together to provide you with the care you need, when you need it.   You may see any of the following providers on your designated Care Team at your next follow up: Dr Arvilla Meres Dr Marca Ancona Dr. Dorthula Nettles Dr. Clearnce Hasten Amy Filbert Schilder, NP Robbie Lis, [place] [facility] [place], [place] Brynda Peon, NP [place] Lee, NP Clarisa Kindred, NP Karle Plumber, PharmD Enos Fling, PharmD   Please be sure to bring in all your medications bottles to every appointment.    Thank you for choosing [facility]

## 2023-08-09 ENCOUNTER — Other Ambulatory Visit (HOSPITAL_COMMUNITY): Payer: Self-pay

## 2023-08-12 ENCOUNTER — Other Ambulatory Visit (HOSPITAL_COMMUNITY): Payer: Self-pay

## 2023-08-13 ENCOUNTER — Ambulatory Visit (HOSPITAL_COMMUNITY)
Admission: RE | Admit: 2023-08-13 | Discharge: 2023-08-13 | Disposition: A | Discharge status: Home / Self Care | Source: Ambulatory Visit | Attending: Cardiology | Admitting: Cardiology

## 2023-08-13 ENCOUNTER — Other Ambulatory Visit: Payer: Self-pay

## 2023-08-13 ENCOUNTER — Other Ambulatory Visit (HOSPITAL_COMMUNITY): Payer: Self-pay

## 2023-08-13 DIAGNOSIS — I5022 Chronic systolic (congestive) heart failure: Secondary | ICD-10-CM | POA: Diagnosis present

## 2023-08-13 LAB — BASIC METABOLIC PANEL WITH GFR
Anion gap: 8 (ref 5–15)
BUN: 22 mg/dL — ABNORMAL HIGH (ref 6–20)
CO2: 23 mmol/L (ref 22–32)
Calcium: 8.8 mg/dL — ABNORMAL LOW (ref 8.9–10.3)
Chloride: 108 mmol/L (ref 98–111)
Creatinine, Ser: 1.92 mg/dL — ABNORMAL HIGH (ref 0.61–1.24)
GFR, Estimated: 42 mL/min — ABNORMAL LOW (ref >60)
Glucose, Bld: 91 mg/dL (ref 70–99)
Potassium: 4.5 mmol/L (ref 3.5–5.1)
Sodium: 139 mmol/L (ref 135–145)

## 2023-08-13 LAB — LIPID PANEL
Cholesterol: 123 mg/dL (ref 0–200)
HDL: 66 mg/dL (ref >40)
LDL Cholesterol: 48 mg/dL (ref 0–99)
Total CHOL/HDL Ratio: 1.9 ratio
Triglycerides: 46 mg/dL (ref <150)
VLDL: 9 mg/dL (ref 0–40)

## 2023-08-18 ENCOUNTER — Encounter: Payer: Self-pay | Admitting: Family

## 2023-08-18 ENCOUNTER — Ambulatory Visit (INDEPENDENT_AMBULATORY_CARE_PROVIDER_SITE_OTHER): Admitting: Family

## 2023-08-18 DIAGNOSIS — R600 Localized edema: Secondary | ICD-10-CM

## 2023-08-18 NOTE — Progress Notes (Deleted)
 Office Visit Note   Patient: Randy [name]           Date of Birth: [birth year]           MRN: [id number] Visit Date: 08/18/2023              Requested by: No referring provider defined for this encounter. PCP: Pcp, No  Chief Complaint  Patient presents with   Right Ankle - Pain      HPI: ***  Assessment & Plan: Visit Diagnoses: No diagnosis found.  Plan: ***  Follow-Up Instructions: No follow-ups on file.   Ortho Exam  Patient is alert, oriented, no adenopathy, well-dressed, normal affect, normal respiratory effort. ***  Imaging: No results found. No images are attached to the encounter.  Labs: Lab Results  Component Value Date   HGBA1C 5.4 05/13/2023   HGBA1C 6.5 (H) 02/11/2023   HGBA1C 5.8 (A) 12/22/2022   ESRSEDRATE 54 (H) 02/11/2023   ESRSEDRATE 30 (H) 01/22/2023   ESRSEDRATE 3 08/07/2020   CRP 7.4 (H) 02/11/2023   CRP 26.4 (H) 01/22/2023   REPTSTATUS 01/26/2023 FINAL 01/21/2023   CULT  01/21/2023    NO GROWTH 5 DAYS Performed at [facility], [address], [place], [place] [address]      Lab Results  Component Value Date   ALBUMIN 3.3 (L) 04/07/2023   ALBUMIN 2.5 (L) 02/15/2023   ALBUMIN 2.5 (L) 02/14/2023    Lab Results  Component Value Date   MG 1.9 02/15/2023   MG 2.0 02/14/2023   MG 1.9 02/13/2023   No results found for: "VD25OH"  No results found for: "PREALBUMIN"    Latest Ref Rng & Units 07/16/2023    3:26 PM 07/08/2023    8:30 AM 02/15/2023    8:34 AM  CBC EXTENDED  WBC 4.0 - 10.5 K/uL 9.1  8.7  8.8   RBC 4.22 - 5.81 MIL/uL 5.79  5.35  4.87   Hemoglobin 13.0 - 17.0 g/dL 69.6  29.5  28.4   HCT 39.0 - 52.0 % 44.2  41.3  38.7   Platelets 150 - 400 K/uL 333  382  384      There is no height or weight on file to calculate BMI.  Orders:  No orders of the defined types were placed in this encounter.  No orders of the defined types were placed in this encounter.    Procedures: No procedures  performed  Clinical Data: No additional findings.  ROS:  All other systems negative, except as noted in the HPI. Review of Systems  Objective: Vital Signs: There were no vitals taken for this visit.  Specialty Comments:  No specialty comments available.  PMFS History: Patient Active Problem List   Diagnosis Date Noted   Abscess of skin of right ankle 02/13/2023   Chronic kidney disease, stage 3b (HCC) 02/11/2023   Typical atrial flutter (HCC) 01/26/2023   Sepsis (HCC) 01/21/2023   Persistent atrial fibrillation (HCC) 11/23/2022   Hypercoagulable state due to persistent atrial fibrillation (HCC) 05/21/2022   Osteomyelitis of great toe of right foot (HCC)    Cutaneous abscess of right foot    Cellulitis of right lower extremity 09/15/2021   Leg pain, medial, right 09/14/2021   Permanent atrial fibrillation (HCC) 01/27/2021   SIRS (systemic inflammatory response syndrome) (HCC) 01/27/2021   HTN (hypertension) 01/27/2021   Hypokalemia 01/27/2021   Acute systolic CHF (congestive heart failure) (HCC) 01/27/2021   New onset of congestive heart failure (  HCC) 01/26/2021   Acute CVA (cerebrovascular accident) (HCC) 08/07/2020   Acute upper respiratory infection 11/27/2014   Numbness of finger 09/19/2011   Hilar adenopathy    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HCC) 09/05/2010   Type 2 diabetes mellitus with complication, without long-term current use of insulin  (HCC) 04/23/2010   Renal insufficiency 03/21/2008   VISUAL ACUITY, DECREASED, LEFT EYE 03/19/2008   BELL'S PALSY, LEFT 01/30/2008   Morbid obesity (HCC) 01/20/2008   Malignant hypertension with heart failure and stage 3 chronic kidney disease (HCC) 01/20/2008   Past Medical History:  Diagnosis Date   CKD (chronic kidney disease) stage 3, GFR 30-59 ml/min (HCC)    baseline Cr 1.5-1.7   Decreased visual acuity    Left eye, resolved - hypertensive retinopathy   Diabetes mellitus 2012   Type II   Hilar adenopathy    on CT scan 02/2010, on  rpt scan stable/improved.   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   Systolic murmur    Vitamin D  deficiency     Family History  Problem Relation Age of Onset   Hypertension Mother    Hypertension Father    Diabetes Father    Diabetes Sister    Hypertension Brother    Kidney disease Paternal Grandmother        ESRD   Coronary artery disease Neg Hx    Stroke Neg Hx    Cancer Neg Hx     Past Surgical History:  Procedure Laterality Date   AMPUTATION Right 09/17/2021   Procedure: First ray amputation;  Surgeon: Timothy Ford, MD;  Location: [facility] OR;  Service: Orthopedics;  Laterality: Right;   APPLICATION OF WOUND VAC Right 10/10/2021   Procedure: APPLICATION OF WOUND VAC;  Surgeon: Timothy Ford, MD;  Location: [facility] OR;  Service: Orthopedics;  Laterality: Right;   ATRIAL FIBRILLATION ABLATION N/A 10/02/2022   Procedure: ATRIAL FIBRILLATION ABLATION;  Surgeon: Lei Pump, MD;  Location: [facility] INVASIVE CV LAB;  Service: Cardiovascular;  Laterality: N/A;   ATRIAL FIBRILLATION ABLATION N/A 07/08/2023   Procedure: ATRIAL FIBRILLATION ABLATION;  Surgeon: Lei Pump, MD;  Location: [facility] INVASIVE CV LAB;  Service: Cardiovascular;  Laterality: N/A;   CARDIOVERSION N/A 01/31/2021   Procedure: CARDIOVERSION;  Surgeon: Mardell Shade, MD;  Location: [facility] ENDOSCOPY;  Service: Cardiovascular;  Laterality: N/A;   CARDIOVERSION N/A 07/04/2021   Procedure: CARDIOVERSION;  Surgeon: Mardell Shade, MD;  Location[facility] ENDOSCOPY;  Service: Cardiovascular;  Laterality: N/A;   hospitalization  11/2008   malignant HTN, nl SPEP/UPEP, neg ANCA panel, nl C3/4, neg anti GBM Ab, neg Hep A/B/C, nl renal  US , nl PTH, neg HIV   I & D EXTREMITY Right 10/10/2021   Procedure: RIGHT FOOT DEBRIDEMENT;  Surgeon: Timothy Ford, MD;  Location: [facility] OR;  Service: Orthopedics;  Laterality: Right;   RIGHT/LEFT HEART CATH AND CORONARY ANGIOGRAPHY N/A 01/29/2021   Procedure: RIGHT/LEFT HEART CATH AND CORONARY ANGIOGRAPHY;  Surgeon: Mardell Shade, MD;  Location: [facility] INVASIVE CV LAB;  Service: Cardiovascular;  Laterality: N/A;   TEE WITHOUT CARDIOVERSION N/A 01/31/2021   Procedure: TRANSESOPHAGEAL ECHOCARDIOGRAM (TEE);  Surgeon: Mardell Shade, MD;  Location: [facility] ENDOSCOPY;  Service: Cardiovascular;  Laterality: N/A;   TEE WITHOUT CARDIOVERSION N/A 10/02/2022   Procedure: TRANSESOPHAGEAL ECHOCARDIOGRAM;  Surgeon: Lei Pump, MD;  [facility] LAB;  Service: Cardiovascular;  Laterality: N/A;   US  ECHOCARDIOGRAPHY  11/2008   LVsys fxn EF 50%, mild MR, normal LV size, neg ANA, neg cryoglobulins   US  ECHOCARDIOGRAPHY  2011   severe LVH, EF 40%, LA mildly dilated, PA pressure moderately increased   Social History   Occupational History   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

## 2023-08-20 ENCOUNTER — Other Ambulatory Visit (HOSPITAL_COMMUNITY): Payer: 59

## 2023-09-03 ENCOUNTER — Ambulatory Visit: Payer: 59 | Admitting: Internal Medicine

## 2023-09-06 ENCOUNTER — Encounter: Payer: Self-pay | Admitting: Internal Medicine

## 2023-09-06 ENCOUNTER — Encounter (HOSPITAL_COMMUNITY): Payer: Self-pay | Admitting: Internal Medicine

## 2023-09-07 NOTE — Progress Notes (Signed)
 Is out of compression stockings.  Presents today for order for new compression garments.  No concerns voiced

## 2023-09-09 ENCOUNTER — Other Ambulatory Visit: Payer: Self-pay

## 2023-09-09 ENCOUNTER — Other Ambulatory Visit (HOSPITAL_COMMUNITY): Payer: Self-pay

## 2023-09-15 ENCOUNTER — Ambulatory Visit (HOSPITAL_COMMUNITY): Admission: RE | Admit: 2023-09-15 | Source: Ambulatory Visit

## 2023-09-15 ENCOUNTER — Encounter (HOSPITAL_COMMUNITY): Payer: Self-pay

## 2023-09-17 ENCOUNTER — Other Ambulatory Visit (HOSPITAL_COMMUNITY)

## 2023-09-30 ENCOUNTER — Ambulatory Visit: Admitting: Internal Medicine

## 2023-10-01 ENCOUNTER — Ambulatory Visit: Admitting: Internal Medicine

## 2023-10-01 ENCOUNTER — Encounter: Payer: Self-pay | Admitting: Internal Medicine

## 2023-10-01 ENCOUNTER — Other Ambulatory Visit (HOSPITAL_COMMUNITY): Payer: Self-pay

## 2023-10-01 VITALS — BP 126/80 | HR 80 | Ht >= 80 in | Wt 366.8 lb

## 2023-10-01 DIAGNOSIS — E0580 Other thyrotoxicosis without thyrotoxic crisis or storm: Secondary | ICD-10-CM | POA: Diagnosis not present

## 2023-10-01 DIAGNOSIS — Z7985 Long-term (current) use of injectable non-insulin antidiabetic drugs: Secondary | ICD-10-CM

## 2023-10-01 DIAGNOSIS — T462X5A Adverse effect of other antidysrhythmic drugs, initial encounter: Secondary | ICD-10-CM

## 2023-10-01 DIAGNOSIS — E118 Type 2 diabetes mellitus with unspecified complications: Secondary | ICD-10-CM | POA: Diagnosis not present

## 2023-10-01 DIAGNOSIS — Z7984 Long term (current) use of oral hypoglycemic drugs: Secondary | ICD-10-CM

## 2023-10-01 LAB — POCT GLYCOSYLATED HEMOGLOBIN (HGB A1C): Hemoglobin A1C: 5.7 % — AB (ref 4.0–5.6)

## 2023-10-01 LAB — T3, FREE: T3, Free: 2.8 pg/mL (ref 2.3–4.2)

## 2023-10-01 LAB — TSH: TSH: 1.12 m[IU]/L (ref 0.40–4.50)

## 2023-10-01 LAB — T4, FREE: Free T4: 1.6 ng/dL (ref 0.8–1.8)

## 2023-10-01 MED ORDER — MOUNJARO 12.5 MG/0.5ML ~~LOC~~ SOAJ
12.5000 mg | SUBCUTANEOUS | 3 refills | Status: DC
  Filled 2023-10-01 – 2023-10-06 (×2): qty 2, 28d supply, fill #0
  Filled 2023-11-02 – 2023-11-09 (×2): qty 6, 84d supply, fill #0

## 2023-10-01 MED ORDER — FREESTYLE LIBRE 3 PLUS SENSOR MISC
1.0000 | 3 refills | Status: AC
Start: 2023-10-01 — End: ?
  Filled 2023-10-01 – 2023-10-14 (×2): qty 6, 90d supply, fill #0
  Filled 2023-12-10: qty 2, 30d supply, fill #0
  Filled 2023-12-10: qty 6, 90d supply, fill #0
  Filled 2024-01-08: qty 6, 30d supply, fill #0
  Filled 2024-02-07: qty 2, 30d supply, fill #0
  Filled 2024-02-23 – 2024-03-19 (×2): qty 6, 90d supply, fill #0

## 2023-10-01 MED ORDER — EMPAGLIFLOZIN 10 MG PO TABS
10.0000 mg | ORAL_TABLET | Freq: Every day | ORAL | 3 refills | Status: AC
  Filled 2023-10-01 – 2023-10-14 (×2): qty 30, 30d supply, fill #0
  Filled 2023-11-02 – 2023-11-30 (×3): qty 30, 30d supply, fill #1
  Filled 2023-12-10 – 2023-12-25 (×2): qty 30, 30d supply, fill #2
  Filled 2024-01-08 – 2024-01-17 (×3): qty 30, 30d supply, fill #3
  Filled 2024-01-27: qty 30, 30d supply, fill #0
  Filled 2024-02-23: qty 30, 30d supply, fill #1
  Filled 2024-03-19 – 2024-03-21 (×2): qty 30, 30d supply, fill #2
  Filled 2024-04-12 – 2024-05-08 (×2): qty 30, 30d supply, fill #3
  Filled 2024-05-08: qty 30, 30d supply, fill #0
  Filled 2024-05-08: qty 30, 30d supply, fill #3
  Filled 2024-05-29 – 2024-05-31 (×2): qty 30, 30d supply, fill #1

## 2023-10-01 NOTE — Patient Instructions (Addendum)
 Please continue: - Jardiance  10 mg daily in am - Mounjaro  12.5 mg weekly  Please stop at the lab.  Please return in 6 months.

## 2023-10-01 NOTE — Progress Notes (Addendum)
 Patient ID: Randy [name], male   DOB: 1970-06-08, 53 y.o.   MRN: [id number]  HPI: Randy [name] is a 53 y.o.-year-old male, initially referred by his cardiologist, Dr. 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  Last visit 4.5 months ago.  Interim history: No increased urination, blurry vision, nausea, chest pain. He has atrial fibrillation and had a cardiac ablation 07/08/2023.  DM2: Reviewed his HbA1c levels: Lab Results  Component Value Date   HGBA1C 5.4 05/13/2023   HGBA1C 6.5 (H) 02/11/2023   HGBA1C 5.8 (A) 12/22/2022   HGBA1C 6.4 (A) 08/12/2022   HGBA1C 5.9 (A) 05/06/2022   HGBA1C 10.1 (H) 09/14/2021   HGBA1C 6.3 (H) 01/27/2021   HGBA1C 7.4 (H) 08/08/2020   HGBA1C 12.5 (H) 11/28/2014   HGBA1C 12.5 (H) 11/10/2013   He is on a regimen of: - Jardiance  10 mg before breakfast - Mounjaro  15 mg weekly - started 11/2021 >> 12.5 mg weekly Metformin  stopped 2/2 CKD, AP. He has not been on insulin .  Pt checks his sugars 0-1x a day: - am: 60s (fasting -religious), 120-130 >> 109-120 >> 80, 85-90s >> 90s-105 - 2h after b'fast: n/c - before lunch: n/c - 2h after lunch: 110-120 >> n/c - before dinner: n/c - 2h after dinner: 160-175 >> 160s usually, 180 (cake) >> 140s-160 >> 160-200 - bedtime: n/c - nighttime: n/c Lowest sugar was 40 >> 70 >> 90s >> 80 >> 90; he has hypoglycemia awareness at 40.  Highest sugar was 180 >> 160 >> 200  Glucometer:One Touch  Pt's meals are: - Breakfast: steak biscuit  - Lunch: eating out - Dinner: skips, or veggies - Snacks: used to drink gatorade, not anymore  - + CKD-sees nephrology, last BUN/creatinine:  Lab Results  Component Value Date   BUN 22 (H) 08/13/2023   BUN 28 (H) 08/06/2023   CREATININE 1.92 (H) 08/13/2023   CREATININE 1.95 (H) 08/06/2023    Lab Results  Component Value Date   MICRALBCREAT 128.9 (H) 12/22/2022   MICRALBCREAT 33.5 (H) 11/10/2013   MICRALBCREAT 81.8 (H) 05/29/2011  He is on Entresto .  - + HL; last set of lipids: Lab Results  Component Value Date   CHOL 123 08/13/2023   HDL 66 08/13/2023   LDLCALC 48 08/13/2023   LDLDIRECT 79.0 11/28/2014   TRIG 46 08/13/2023   CHOLHDL 1.9 08/13/2023  He is on Lipitor 20 mg daily.  - last eye exam was in 11/2022. No DR reportedly. IOP is elevated, stable. [facility].  - no numbness and tingling in his feet. Last foot exam 02/11/2023. He had a R foot ulcer for which he was seeing orthopedics - healing.  However, he developed cellulitis and was also admitted for sepsis in 12/2022.  He mentions that he was in the hospital for a month.  This healed.  Pt has FH of DM - brother, sister, father.  Amiodarone -induced thyrotoxicosis:  Amiodarone  was stopped 11/2021 due to thyrotoxicosis.  04/15/2022: following regimen started by Dr. Bensimhon: - Methimazole  5 mg twice a day - Prednisone  20 mg daily  04/2022: We decreased the doses: - Methimazole  5 mg once a day - Prednisone  10 mg daily  07/2022:  - stopped MMI - Reduced prednisone  to 5 mg daily  09/2022: - Continued prednisone  5 mg daily as his fT4 was slightly elevated However, he stopped as he  run out of the Rx 10/2022.  Reviewed TFTs: Lab Results  Component Value Date   TSH 0.83 05/13/2023   TSH 1.08 12/22/2022   TSH 0.78 10/08/2022   TSH 0.88 08/12/2022   TSH 0.03 (L) 05/06/2022   TSH 0.010 (L) 04/22/2022   TSH <0.010 (L) 03/25/2022   TSH <0.010 (L) 02/03/2022   TSH 0.103 (L) 11/21/2021   TSH 0.90 09/05/2010   TSH 1.345 03/13/2010   Lab Results  Component Value Date   FREET4 1.8 05/13/2023   FREET4 1.39 12/22/2022   FREET4 1.72 (H) 10/08/2022   FREET4 1.51 08/12/2022   FREET4 1.83 (H) 05/06/2022   FREET4 2.08 (H) 04/22/2022   FREET4 2.89 (H) 03/25/2022   FREET4 2.50 (H) 02/03/2022    FREET4 2.20 (H) 12/05/2021   Lab Results  Component Value Date   T3FREE 3.3 05/13/2023   T3FREE 3.4 12/22/2022   T3FREE 2.8 10/08/2022   T3FREE 3.1 08/12/2022   T3FREE 3.8 05/06/2022   T3FREE 2.3 04/22/2022   T3FREE 3.4 03/25/2022   T3FREE 2.0 02/03/2022   T3FREE 2.6 12/05/2021   Lab Results  Component Value Date   TSI <89 05/06/2022   No FH of thyroid  cancer. (Wife has Graves ds). No h/o radiation tx to head or neck. No recent contrast studies. No herbal supplements. No Biotin use.   CTA neck (07/28/2020): Chronic moderate diffuse enlargement of the thyroid  gland without significant heterogeneity or a discrete nodule and for which no imaging follow-up is recommended.  Pt denies: - feeling nodules in neck - hoarseness - dysphagia - choking  She also has a history of significant HTN, obesity- 450 lbs at his highest weight. He had a cardiac ablation in 09/2022 and 06/2023.  ROS: + See HPI  Past Medical History:  Diagnosis Date   CKD (chronic kidney disease) stage 3, GFR 30-59 ml/min (HCC)    baseline Cr 1.5-1.7   Decreased visual acuity    Left eye, resolved - hypertensive retinopathy   Diabetes mellitus 2012   Type II   Hilar adenopathy    on CT scan 02/2010, on rpt scan stable/improved.   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   Systolic murmur    Vitamin D  deficiency    Past Surgical History:  Procedure Laterality Date   AMPUTATION Right 09/17/2021   Procedure: First ray amputation;  Surgeon: Timothy Ford, MD;  Location: [facility] OR;  Service: Orthopedics;  Laterality: Right;   APPLICATION OF WOUND VAC Right 10/10/2021   Procedure: APPLICATION OF WOUND VAC;  Surgeon:  Timothy Ford, MD;  Location: [facility] OR;  Service: Orthopedics;  Laterality: Right;   ATRIAL FIBRILLATION ABLATION N/A 10/02/2022   Procedure: ATRIAL FIBRILLATION ABLATION;  Surgeon: Lei Pump, MD;  Location: [facility] INVASIVE CV LAB;  Service: Cardiovascular;  Laterality: N/A;   ATRIAL FIBRILLATION ABLATION N/A 07/08/2023   Procedure: ATRIAL FIBRILLATION ABLATION;  Surgeon: Lei Pump, MD;  Location: [facility] INVASIVE CV LAB;  Service: Cardiovascular;  Laterality: N/A;   CARDIOVERSION N/A 01/31/2021   Procedure: CARDIOVERSION;  Surgeon: Mardell Shade, MD;  Location: [facility] ENDOSCOPY;  Service: Cardiovascular;  Laterality: N/A;   CARDIOVERSION N/A 07/04/2021   Procedure: CARDIOVERSION;  Surgeon: Mardell Shade, MD;  Location: [facility] ENDOSCOPY;  Service: Cardiovascular;  Laterality: N/A;   hospitalization  11/2008   malignant HTN, nl SPEP/UPEP, neg ANCA panel, nl C3/4, neg anti GBM Ab, neg Hep A/B/C, nl renal US , nl PTH, neg HIV   I & D EXTREMITY Right 10/10/2021   Procedure: RIGHT FOOT DEBRIDEMENT;  Surgeon: Timothy Ford, MD;  Location: [facility] OR;  Service: Orthopedics;  Laterality: Right;   RIGHT/LEFT HEART CATH AND CORONARY ANGIOGRAPHY N/A 01/29/2021   Procedure: RIGHT/LEFT HEART CATH AND CORONARY ANGIOGRAPHY;  Surgeon: Mardell Shade, MD;  Location: [facility] INVASIVE CV LAB;  Service: Cardiovascular;  Laterality: N/A;   TEE WITHOUT CARDIOVERSION N/A 01/31/2021   Procedure: TRANSESOPHAGEAL ECHOCARDIOGRAM (TEE);  Surgeon: Mardell Shade, MD;  Location: [facility] ENDOSCOPY;  Service: Cardiovascular;  Laterality: N/A;   TEE WITHOUT CARDIOVERSION N/A 10/02/2022   Procedure: TRANSESOPHAGEAL ECHOCARDIOGRAM;  Surgeon: Lei Pump, MD;  Location: [facility] INVASIVE CV LAB;  Service: Cardiovascular;  Laterality: N/A;   US  ECHOCARDIOGRAPHY  11/2008   LVsys fxn EF 50%, mild MR, normal LV size, neg ANA, neg cryoglobulins   US  ECHOCARDIOGRAPHY  2011   severe LVH, EF 40%, LA mildly dilated, PA pressure  moderately increased   Social History   Socioeconomic History   Marital status: Married    Spouse name: Jerral Mccauley   Number of children: 3   Years of education: Not on file   Highest education level: Bachelor's degree (e.g., BA, AB, BS)  Occupational History   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  Other Topics Concern   Not on file  Social History Narrative   Newly married, 2012, 1 daughter, 1 son   No injectable steroid cycles   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, 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)   Regular exercise-yes   Social Drivers of Health   Financial Resource Strain: Low Risk  (01/27/2021)   Overall Financial Resource Strain (CARDIA)    Difficulty of Paying Living Expenses: Not hard at all  Food Insecurity: No Food Insecurity (02/11/2023)   Hunger Vital Sign    Worried About Running Out of Food in the Last Year: Never true    Ran Out of Food in the Last Year: Never true  Transportation Needs: No Transportation Needs (02/11/2023)   PRAPARE - Administrator, Civil Service (Medical): No    Lack of Transportation (Non-Medical): No  Physical Activity: Not on file  Stress: Not on file  Social Connections: Unknown (09/06/2021)   [facility] from [facility]   Social Network    Social Network: Not on file  Intimate Partner Violence: Not At Risk (02/11/2023)   Humiliation, Afraid, Rape, and Kick questionnaire    Fear of Current or Ex-Partner: No    Emotionally Abused: No    Physically Abused: No    Sexually Abused: No   Current Outpatient Medications on File Prior to Visit  Medication Sig  Dispense Refill   apixaban  (ELIQUIS ) 5 MG TABS  tablet Take 1 tablet (5 mg total) by mouth 2 (two) times daily. 180 tablet 3   atorvastatin  (LIPITOR) 20 MG tablet Take 1 tablet (20 mg total) by mouth daily. 90 tablet 3   diltiazem  (CARDIZEM  CD) 360 MG 24 hr capsule Take 1 capsule (360 mg total) by mouth daily. 90 capsule 2   empagliflozin  (JARDIANCE ) 10 MG TABS tablet Take 1 tablet by mouth once daily 90 tablet 1   furosemide  (LASIX ) 20 MG tablet Take 1 tablet (20 mg total) by mouth as needed. 30 tablet 3   isosorbide -hydrALAZINE  (BIDIL ) 20-37.5 MG tablet Take 2 tablets by mouth 3 (three) times daily. 545 tablet 3   metoprolol  succinate (TOPROL -XL) 100 MG 24 hr tablet Take 1 tablet (100 mg total) by mouth at bedtime. Take with or immediately following a meal. 90 tablet 3   sacubitril -valsartan  (ENTRESTO ) 49-51 MG Take 1 tablet by mouth 2 (two) times daily. 60 tablet 11   spironolactone  (ALDACTONE ) 25 MG tablet Take 0.5 tablets (12.5 mg total) by mouth daily. 90 tablet 3   tirzepatide  (MOUNJARO ) 12.5 MG/0.5ML Pen Inject 12.5 mg into the skin once a week. (Patient taking differently: Inject 12.5 mg into the skin once a week. On Thursadays) 6 mL 3   No current facility-administered medications on file prior to visit.   Allergies  Allergen Reactions   Fish-Derived Products Anaphylaxis   Shellfish Allergy Anaphylaxis   Family History  Problem Relation Age of Onset   Hypertension Mother    Hypertension Father    Diabetes Father    Diabetes Sister    Hypertension Brother    Kidney disease Paternal Grandmother        ESRD   Coronary artery disease Neg Hx    Stroke Neg Hx    Cancer Neg Hx    PE: BP 126/80   Pulse 80   Ht 6\' 8"  (2.032 m)   Wt (!) 366 lb 12.8 oz (166.4 kg)   SpO2 96%   BMI 40.30 kg/m  Wt Readings from Last 10 Encounters:  10/01/23 (!) 366 lb 12.8 oz (166.4 kg)  08/06/23 (!) 374 lb 9.6 oz (169.9 kg)  08/05/23 (!) 368 lb 12.8 oz (167.3 kg)  07/16/23 (!) 362 lb 9.6 oz  (164.5 kg)  07/08/23 (!) 370 lb (167.8 kg)  05/17/23 (!) 364 lb 6.4 oz (165.3 kg)  05/13/23 (!) 371 lb (168.3 kg)  04/07/23 (!) 360 lb 9.6 oz (163.6 kg)  02/16/23 (!) 360 lb 3.7 oz (163.4 kg)  01/22/23 (!) 385 lb 2.3 oz (174.7 kg)   Constitutional: overweight, in NAD Eyes:  no exophthalmos ENT: no neck masses, no cervical lymphadenopathy Cardiovascular: RRR, No MRG, + significant bilateral leg swelling (R>L) Respiratory: CTA B Musculoskeletal: no deformities except absent right hallux Skin:no rashes Neurological: no tremor with outstretched hands Diabetic Foot Exam - Simple   Simple Foot Form Diabetic Foot exam was performed with the following findings: Yes 10/01/2023  9:14 AM  Visual Inspection No deformities, no ulcerations, no other skin breakdown bilaterally: Yes Sensation Testing See comments: Yes Pulse Check Posterior Tibialis and Dorsalis pulse intact bilaterally: Yes Comments + R hallux amputation Decreased sensation to monofilament in bilateral feet.      ASSESSMENT: 1. DM2, non-insulin -dependent, uncontrolled, with complications - CHF - A-fib with RVR - h/o CVA, CKD - osteomyelitis of the right foot, history of hallux amputation  2.  Amiodarone -induced thyrotoxicosis  PLAN:  1. Patient with longstanding, type 2 diabetes, on oral antidiabetic regimen with  SGLT2 inhibitor and also weekly GLP-1/GIP receptor agonist, with improving control at last visit.  At that time, sugars were all at goal, under 100 in the morning.  I advised him to let me know if he developped low blood sugars, in which case, I was planning to decrease his Mounjaro  dose.  HbA1c at that time was 6.4%, decreased from 6.5%. -At today's visit, sugars are mostly at goal, with occasional higher values after dinner.  For now, I did not suggest a change in regimen.  I refilled his prescriptions.  We discussed about possibly using a CGM and he appears to be interested in this.  He was under the impression  that the device can monitor his location so this is the reason why he declined one in the past.  However, I advised him that this is not the case and he agrees to use this. - I suggested to:  Patient Instructions  Please continue: - Jardiance  10 mg daily in am - Mounjaro  12.5 mg weekly  Please stop at the lab.  Please return in 6 months.  - we checked his HbA1c: 5.7% (slightly higher but still at goal) - advised to check sugars at different times of the day - 1x a day, rotating check times - advised for yearly eye exams >> he is UTD - return to clinic in 4 months  2.  Amiodarone  induced thyrotoxicosis (AIT) -Patient with history of undetectable TSH and high free T4 in the setting of amiodarone  treatment.  He did not have thyrotoxic symptoms: Weight loss, heat intolerance, hyperdefecation, palpitations, anxiety and in fact he had a 21 pound weight gain, possibly also related to fluid overload.  Amiodarone  was stopped in 11/2021 due to the thyrotoxicosis.  At the same time, he was started on methimazole  5 mg twice a day and prednisone  20 mg daily.  He was also on Toprol -XL 100 mg daily. - When I first saw him, he did not appear to have other culprit for thyrotoxicosis other than amiodarone  use.  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  We discussed that type II AIDP is usually managed by prednisone  so I advised him to decrease the methimazole  dose.  We decreased the dose of prednisone  further and he came off the steroid subsequently (he actually ran out of prednisone  and did not refill it).  At last visit, of steroids and thionamides, his TFTs were all normal.  -At today's visit, he denies thyrotoxic signs or symptoms - Will recheck his TFTs today.  Component     Latest Ref Rng 10/01/2023  Hemoglobin A1C     4.0 - 5.6 % 5.7 !   TSH     0.40 - 4.50 mIU/L 1.12   T4,Free(Direct)     0.8 - 1.8 ng/dL 1.6    Triiodothyronine,Free,Serum     2.3 - 4.2 pg/mL 2.8   Thyroid  tests are normal.  Emilie Harden, MD PhD [facility] Endocrinology

## 2023-10-02 ENCOUNTER — Other Ambulatory Visit (HOSPITAL_COMMUNITY): Payer: Self-pay

## 2023-10-04 ENCOUNTER — Other Ambulatory Visit: Payer: Self-pay

## 2023-10-04 ENCOUNTER — Ambulatory Visit: Payer: Self-pay | Admitting: Internal Medicine

## 2023-10-06 ENCOUNTER — Other Ambulatory Visit (HOSPITAL_COMMUNITY): Payer: Self-pay

## 2023-10-10 NOTE — Progress Notes (Deleted)
  Electrophysiology Office Note:   Date:  10/10/2023  ID:  Randy [name], DOB 1970-11-14, MRN [id number]  Primary Cardiologist: None Primary Heart Failure: Jules Oar, MD Electrophysiologist: Lei Pump, MD  {Click to update primary MD,subspecialty MD or APP then REFRESH:1}    History of Present Illness:   Randy [name] is a 53 y.o. male with h/o AF, HTN, HFrEF, HLD, CAD, CVA, DM II, CKD, hyperthyroidism seen today for routine electrophysiology follow-up s/p Ablation.  Since last being seen in our clinic the patient reports doing ***.    He denies chest pain, palpitations, dyspnea, PND, orthopnea, nausea, vomiting, dizziness, syncope, edema, weight gain, or early satiety.    Review of systems complete and found to be negative unless listed in HPI.   EP Information / Studies Reviewed:    EKG is ordered today. Personal review as below.      Studies:  ECHO 12/2022 > LVEF 50-555%, LA severely dilated, RA mildly dilated  EPS *** >    Arrhythmia / AAD AF > initial dx 11/2019 in setting of CVA    Risk Assessment/Calculations:    CHA2DS2-VASc Score = 6  {Confirm score is correct.  If not, click here to update score.  REFRESH note.  :1} This indicates a 9.7% annual risk of stroke. The patient's score is based upon: CHF History: 1 HTN History: 1 Diabetes History: 1 Stroke History: 2 Vascular Disease History: 1 Age Score: 0 Gender Score: 0   {This patient has a significant risk of stroke if diagnosed with atrial fibrillation.  Please consider VKA or DOAC agent for anticoagulation if the bleeding risk is acceptable.   You can also use the SmartPhrase .[email or web address] for documentation.   :[id number]} No BP recorded.  {Refresh Note OR Click here to enter BP  :1}***        Physical Exam:   VS:  There were no vitals taken for this visit.   Wt Readings from Last 3 Encounters:  10/01/23 (!) 366 lb 12.8 oz (166.4 kg)  08/06/23 (!) 374 lb 9.6 oz (169.9 kg)  08/05/23 (!)  368 lb 12.8 oz (167.3 kg)     GEN: Well nourished, well developed in no acute distress NECK: No JVD; No carotid bruits CARDIAC: {EPRHYTHM:[id number]}, no murmurs, rubs, gallops RESPIRATORY:  Clear to auscultation without rales, wheezing or rhonchi  ABDOMEN: Soft, non-tender, non-distended EXTREMITIES:  No edema; No deformity   ASSESSMENT AND PLAN:    Persistent Atrial Fibrillation  Atrial Flutter  CHA2DS2-VASc 6, s/p ablation 09/2022, 06/2023  -EKG with NSR ***  -diltiazem  360 mg daily  -toprol  100 mg daily   Secondary Hypercoagulable State  -continue Eliquis  5mg  BID, dose reviewed and appropriate by age / wt   HFrEF  Recovered EF, previously 25-30%, repeat 12/2022 with improved function  -euvolemic on exam ***   Hypertension  -well controlled on current regimen ***    Follow up with Dr. Lawana Pray {EPFOLLOW JO:[id number]}  Signed, Creighton Doffing, NP-C, AGACNP-BC [facility] - Electrophysiology  10/10/2023, 10:30 AM

## 2023-10-11 ENCOUNTER — Ambulatory Visit: Admitting: Pulmonary Disease

## 2023-10-11 DIAGNOSIS — I4819 Other persistent atrial fibrillation: Secondary | ICD-10-CM

## 2023-10-11 DIAGNOSIS — I483 Typical atrial flutter: Secondary | ICD-10-CM

## 2023-10-11 DIAGNOSIS — I10 Essential (primary) hypertension: Secondary | ICD-10-CM

## 2023-10-11 DIAGNOSIS — D6869 Other thrombophilia: Secondary | ICD-10-CM

## 2023-10-13 ENCOUNTER — Other Ambulatory Visit (HOSPITAL_COMMUNITY): Payer: Self-pay

## 2023-10-15 ENCOUNTER — Other Ambulatory Visit: Payer: Self-pay

## 2023-10-18 ENCOUNTER — Other Ambulatory Visit (HOSPITAL_COMMUNITY): Payer: Self-pay

## 2023-10-19 ENCOUNTER — Encounter: Payer: Self-pay | Admitting: Cardiology

## 2023-10-19 ENCOUNTER — Ambulatory Visit: Attending: Cardiology | Admitting: Cardiology

## 2023-10-19 VITALS — BP 140/62 | HR 83 | Ht >= 80 in | Wt 368.0 lb

## 2023-10-19 DIAGNOSIS — D6869 Other thrombophilia: Secondary | ICD-10-CM

## 2023-10-19 DIAGNOSIS — I483 Typical atrial flutter: Secondary | ICD-10-CM

## 2023-10-19 DIAGNOSIS — I4821 Permanent atrial fibrillation: Secondary | ICD-10-CM

## 2023-10-19 DIAGNOSIS — I10 Essential (primary) hypertension: Secondary | ICD-10-CM

## 2023-10-19 DIAGNOSIS — I5022 Chronic systolic (congestive) heart failure: Secondary | ICD-10-CM

## 2023-10-19 NOTE — Progress Notes (Signed)
 Electrophysiology Office Note:   Date:  10/19/2023  ID:  Rolly Magri, DOB 1970/10/27, MRN [id number]  Primary Cardiologist: None Primary Heart Failure: Toribio Fuel, MD Electrophysiologist: Soyla Gladis Norton, MD      History of Present Illness:   Randy [name] is a 53 y.o. male with h/o persistent atrial fibrillation/typical atrial flutter, chronic systolic heart failure, hypertension, hypothyroidism, diabetes seen today for routine electrophysiology followup.   Since last being seen in our clinic the patient reports doing well since his ablation.  He has not had any further episodes of atrial fibrillation.  He has been able to do all of his daily activities without restriction.  He has no acute complaints at this time.  He is happy with how he has been doing.  He has been exercising, but is not getting his heart rate above 100 bpm.  I have told him that he can exercise without restriction.  he denies chest pain, palpitations, dyspnea, PND, orthopnea, nausea, vomiting, dizziness, syncope, edema, weight gain, or early satiety.   Review of systems complete and found to be negative unless listed in HPI.   EP Information / Studies Reviewed:    EKG is ordered today. Personal review as below.  EKG Interpretation Date/Time:  Tuesday October 19 2023 15:38:43 EDT Ventricular Rate:  83 PR Interval:  236 QRS Duration:  94 QT Interval:  404 QTC Calculation: 474 R Axis:   91  Text Interpretation: Sinus rhythm with 1st degree A-V block Lateral infarct (cited on or before 05-Aug-2023) When compared with ECG of 05-Aug-2023 11:41, Questionable change in QRS axis Confirmed by Debanhi Blaker ([id number]) on 10/19/2023 3:49:36 PM   Risk Assessment/Calculations:    CHA2DS2-VASc Score = 6   This indicates a 9.7% annual risk of stroke. The patient's score is based upon: CHF History: 1 HTN History: 1 Diabetes History: 1 Stroke History: 2 Vascular Disease History: 1 Age Score: 0 Gender Score: 0             Physical Exam:   VS:  BP (!) 140/62 (BP Location: Right Arm, Patient Position: Sitting, Cuff Size: Large)   Pulse 83   Ht 6' 8 (2.032 m)   Wt (!) 368 lb (166.9 kg)   SpO2 98%   BMI 40.43 kg/m    Wt Readings from Last 3 Encounters:  10/19/23 (!) 368 lb (166.9 kg)  10/01/23 (!) 366 lb 12.8 oz (166.4 kg)  08/06/23 (!) 374 lb 9.6 oz (169.9 kg)     GEN: Well nourished, well developed in no acute distress NECK: No JVD; No carotid bruits CARDIAC: Regular rate and rhythm, no murmurs, rubs, gallops RESPIRATORY:  Clear to auscultation without rales, wheezing or rhonchi  ABDOMEN: Soft, non-tender, non-distended EXTREMITIES:  No edema; No deformity   ASSESSMENT AND PLAN:    1.  Persistent atrial fibrillation/flutter: Post ablation June 2024 with repeat ablation 07/08/2023.  After his second ablation he has done well.  He remains in sinus rhythm.  Faisal Stradling continue with current management.  2.  Secondary hypercoagulable state: On Eliquis   3.  Chronic systolic heart failure: On medical therapy per primary cardiology.  Ejection fraction is improved with maintenance of sinus rhythm.  4.  Hypertension: Mildly elevated but usually well-controlled  5.  Coronary artery disease: Nonobstructive on most recent catheterization.  Plan per primary cardiology.  5.  Obesity: Lifestyle modification encouraged.  On Mounjaro   6.  CKD stage IIIa: Has been referred to nephrology  Follow up with [facility] in 12  months  Signed, Tiandra Swoveland Gladis Norton, MD

## 2023-11-02 ENCOUNTER — Other Ambulatory Visit: Payer: Self-pay

## 2023-11-02 ENCOUNTER — Other Ambulatory Visit (HOSPITAL_COMMUNITY): Payer: Self-pay

## 2023-11-09 ENCOUNTER — Other Ambulatory Visit: Payer: Self-pay

## 2023-11-09 ENCOUNTER — Other Ambulatory Visit (HOSPITAL_COMMUNITY): Payer: Self-pay

## 2023-11-17 ENCOUNTER — Other Ambulatory Visit: Payer: Self-pay | Admitting: Internal Medicine

## 2023-11-17 DIAGNOSIS — E118 Type 2 diabetes mellitus with unspecified complications: Secondary | ICD-10-CM

## 2023-11-30 ENCOUNTER — Other Ambulatory Visit: Payer: Self-pay

## 2023-11-30 ENCOUNTER — Other Ambulatory Visit (HOSPITAL_COMMUNITY): Payer: Self-pay

## 2023-11-30 ENCOUNTER — Other Ambulatory Visit (HOSPITAL_COMMUNITY): Payer: Self-pay | Admitting: Physician Assistant

## 2023-11-30 MED ORDER — FUROSEMIDE 20 MG PO TABS
20.0000 mg | ORAL_TABLET | ORAL | 3 refills | Status: AC | PRN
Start: 2023-11-30 — End: ?
  Filled 2023-11-30 – 2023-12-10 (×2): qty 30, 30d supply, fill #0
  Filled 2023-12-25 – 2024-01-13 (×4): qty 30, 30d supply, fill #1
  Filled 2024-01-19 – 2024-02-07 (×3): qty 30, 30d supply, fill #2
  Filled 2024-02-23 – 2024-03-21 (×3): qty 30, 30d supply, fill #3

## 2023-12-03 ENCOUNTER — Ambulatory Visit: Attending: Cardiology | Admitting: Cardiology

## 2023-12-10 ENCOUNTER — Other Ambulatory Visit (HOSPITAL_COMMUNITY): Payer: Self-pay

## 2023-12-13 ENCOUNTER — Other Ambulatory Visit: Payer: Self-pay | Admitting: Internal Medicine

## 2023-12-13 DIAGNOSIS — E118 Type 2 diabetes mellitus with unspecified complications: Secondary | ICD-10-CM

## 2023-12-20 ENCOUNTER — Other Ambulatory Visit (HOSPITAL_COMMUNITY): Payer: Self-pay

## 2023-12-23 ENCOUNTER — Encounter: Payer: Self-pay | Admitting: Cardiology

## 2023-12-23 NOTE — Telephone Encounter (Signed)
 Called patient back about message. Patient stated he was fasting yesterday and then decided to drink a lot of [facility]. Patient's HR was 117 and patient stated his BP was low.  Patient stated he had some chest discomfort due to the elevated heart rate last night. Patient denies any chest pressure or pain. Patient stated he feels fine right now. Patient took his morning medications earlier that included his diltiazem . Informed patient to keep a well balanced diet, stay hydrated, and reduce his caffeine intake. Encouraged patient to let us  know if symptoms come back or worsen. Will send message to his provider for further advisement.

## 2023-12-25 ENCOUNTER — Other Ambulatory Visit (HOSPITAL_COMMUNITY): Payer: Self-pay

## 2023-12-28 ENCOUNTER — Other Ambulatory Visit (HOSPITAL_COMMUNITY): Payer: Self-pay

## 2023-12-29 ENCOUNTER — Other Ambulatory Visit: Payer: Self-pay

## 2024-01-09 ENCOUNTER — Other Ambulatory Visit (HOSPITAL_COMMUNITY): Payer: Self-pay

## 2024-01-10 ENCOUNTER — Other Ambulatory Visit: Payer: Self-pay

## 2024-01-13 ENCOUNTER — Other Ambulatory Visit: Payer: Self-pay

## 2024-01-13 ENCOUNTER — Other Ambulatory Visit (HOSPITAL_COMMUNITY): Payer: Self-pay

## 2024-01-13 ENCOUNTER — Encounter (HOSPITAL_COMMUNITY): Payer: Self-pay

## 2024-01-14 ENCOUNTER — Other Ambulatory Visit (HOSPITAL_COMMUNITY): Payer: Self-pay

## 2024-01-15 ENCOUNTER — Other Ambulatory Visit (HOSPITAL_COMMUNITY): Payer: Self-pay

## 2024-01-16 ENCOUNTER — Other Ambulatory Visit (HOSPITAL_COMMUNITY): Payer: Self-pay

## 2024-01-18 ENCOUNTER — Other Ambulatory Visit (HOSPITAL_COMMUNITY): Payer: Self-pay

## 2024-01-19 ENCOUNTER — Other Ambulatory Visit: Payer: Self-pay

## 2024-01-19 ENCOUNTER — Other Ambulatory Visit (HOSPITAL_COMMUNITY): Payer: Self-pay

## 2024-01-19 ENCOUNTER — Other Ambulatory Visit: Payer: Self-pay | Admitting: Cardiology

## 2024-01-19 MED ORDER — DILTIAZEM HCL ER COATED BEADS 360 MG PO CP24
360.0000 mg | ORAL_CAPSULE | Freq: Every day | ORAL | 2 refills | Status: AC
  Filled 2024-01-19 – 2024-01-23 (×2): qty 90, 90d supply, fill #0
  Filled 2024-02-07: qty 30, 30d supply, fill #0
  Filled 2024-02-23 – 2024-03-21 (×3): qty 30, 30d supply, fill #1
  Filled 2024-04-15: qty 30, 30d supply, fill #2
  Filled 2024-05-16: qty 30, 30d supply, fill #3

## 2024-01-20 ENCOUNTER — Other Ambulatory Visit: Payer: Self-pay

## 2024-01-20 ENCOUNTER — Other Ambulatory Visit (HOSPITAL_COMMUNITY): Payer: Self-pay

## 2024-01-24 ENCOUNTER — Other Ambulatory Visit (HOSPITAL_COMMUNITY): Payer: Self-pay

## 2024-01-24 ENCOUNTER — Other Ambulatory Visit: Payer: Self-pay

## 2024-01-27 ENCOUNTER — Other Ambulatory Visit (HOSPITAL_COMMUNITY): Payer: Self-pay

## 2024-01-27 ENCOUNTER — Other Ambulatory Visit: Payer: Self-pay

## 2024-01-31 ENCOUNTER — Other Ambulatory Visit (HOSPITAL_COMMUNITY): Payer: Self-pay

## 2024-02-01 ENCOUNTER — Other Ambulatory Visit (HOSPITAL_COMMUNITY): Payer: Self-pay

## 2024-02-01 MED FILL — Tirzepatide Soln Auto-injector 12.5 MG/0.5ML: SUBCUTANEOUS | 28 days supply | Qty: 2 | Fill #0 | Status: AC

## 2024-02-07 ENCOUNTER — Other Ambulatory Visit (HOSPITAL_COMMUNITY): Payer: Self-pay

## 2024-02-07 ENCOUNTER — Other Ambulatory Visit: Payer: Self-pay

## 2024-02-08 ENCOUNTER — Encounter: Payer: Self-pay | Admitting: Pharmacist

## 2024-02-08 ENCOUNTER — Other Ambulatory Visit: Payer: Self-pay

## 2024-02-08 ENCOUNTER — Other Ambulatory Visit (HOSPITAL_COMMUNITY): Payer: Self-pay

## 2024-02-23 ENCOUNTER — Other Ambulatory Visit: Payer: Self-pay

## 2024-02-23 ENCOUNTER — Encounter: Payer: Self-pay | Admitting: Pharmacist

## 2024-02-23 ENCOUNTER — Other Ambulatory Visit (HOSPITAL_COMMUNITY): Payer: Self-pay

## 2024-02-23 MED FILL — Tirzepatide Soln Auto-injector 12.5 MG/0.5ML: SUBCUTANEOUS | 28 days supply | Qty: 2 | Fill #1 | Status: AC

## 2024-02-25 ENCOUNTER — Other Ambulatory Visit: Payer: Self-pay

## 2024-02-28 ENCOUNTER — Encounter: Payer: Self-pay | Admitting: Radiology

## 2024-03-19 ENCOUNTER — Other Ambulatory Visit: Payer: Self-pay | Admitting: Internal Medicine

## 2024-03-19 DIAGNOSIS — E118 Type 2 diabetes mellitus with unspecified complications: Secondary | ICD-10-CM

## 2024-03-20 ENCOUNTER — Other Ambulatory Visit: Payer: Self-pay

## 2024-03-20 ENCOUNTER — Other Ambulatory Visit (HOSPITAL_COMMUNITY): Payer: Self-pay

## 2024-03-20 MED ORDER — MOUNJARO 12.5 MG/0.5ML ~~LOC~~ SOAJ
12.5000 mg | SUBCUTANEOUS | 1 refills | Status: AC
  Filled 2024-03-20: qty 2, 28d supply, fill #0
  Filled 2024-03-26 – 2024-04-12 (×2): qty 2, 28d supply, fill #1
  Filled 2024-05-10: qty 2, 28d supply, fill #2

## 2024-03-21 ENCOUNTER — Other Ambulatory Visit: Payer: Self-pay

## 2024-03-21 ENCOUNTER — Ambulatory Visit: Attending: Cardiology | Admitting: Cardiology

## 2024-03-21 ENCOUNTER — Encounter: Payer: Self-pay | Admitting: Cardiology

## 2024-03-21 ENCOUNTER — Encounter (HOSPITAL_COMMUNITY): Payer: Self-pay | Admitting: Anesthesiology

## 2024-03-21 ENCOUNTER — Encounter: Payer: Self-pay | Admitting: Pharmacist

## 2024-03-21 ENCOUNTER — Other Ambulatory Visit (HOSPITAL_COMMUNITY): Payer: Self-pay

## 2024-03-21 VITALS — BP 118/92 | HR 117 | Ht >= 80 in | Wt 374.0 lb

## 2024-03-21 DIAGNOSIS — Z79899 Other long term (current) drug therapy: Secondary | ICD-10-CM | POA: Diagnosis not present

## 2024-03-21 DIAGNOSIS — G4733 Obstructive sleep apnea (adult) (pediatric): Secondary | ICD-10-CM

## 2024-03-21 DIAGNOSIS — I10 Essential (primary) hypertension: Secondary | ICD-10-CM | POA: Diagnosis not present

## 2024-03-21 DIAGNOSIS — Z01812 Encounter for preprocedural laboratory examination: Secondary | ICD-10-CM

## 2024-03-21 DIAGNOSIS — I4819 Other persistent atrial fibrillation: Secondary | ICD-10-CM | POA: Diagnosis not present

## 2024-03-21 NOTE — Addendum Note (Signed)
 Addended by: JANIT GENI CROME on: 03/21/2024 10:13 AM   Modules accepted: Orders

## 2024-03-21 NOTE — Progress Notes (Signed)
 Pt called for pre procedure instructions.  Voicemail left on identified message Arrival time 0915 NPO after midnight explained Instructed to take am meds with sip of water and confirmed blood thinner consistency Instructed pt need for ride home tomorrow and have responsible adult with them for 24 hrs post procedure.

## 2024-03-21 NOTE — Addendum Note (Signed)
 Addended by: JANIT GENI CROME on: 03/21/2024 09:37 AM   Modules accepted: Orders

## 2024-03-21 NOTE — Patient Instructions (Signed)
 Medication Instructions:  Your physician recommends that you continue on your current medications as directed. Please refer to the Current Medication list given to you today.  *If you need a refill on your cardiac medications before your next appointment, please call your pharmacy*  Lab Work: Please complete a BMET and a CBC before you leave today.   If you have labs (blood work) drawn today and your tests are completely normal, you will receive your results only by: MyChart Message (if you have MyChart) OR A paper copy in the mail If you have any lab test that is abnormal or we need to change your treatment, we will call you to review the results.  Testing/Procedures:   Dear Randy [name]  You are scheduled for a Cardioversion on Wednesday, November 26 with Dr. Deneise.  Please arrive at the [facility] (Main Entrance A) at [facility]: [address] [place], [place] [address] at 9:00 AM (This time is 1 hour(s) before your procedure to ensure your preparation).   Free valet parking service is available. You will check in at ADMITTING.   *Please Note: You will receive a call the day before your procedure to confirm the appointment time. That time may have changed from the original time based on the schedule for that day.*   DIET:  Nothing to eat or drink after midnight except a sip of water with medications (see medication instructions below)  MEDICATION INSTRUCTIONS: !!IF ANY NEW MEDICATIONS ARE STARTED AFTER TODAY, PLEASE NOTIFY YOUR PROVIDER AS SOON AS POSSIBLE!!  FYI: Medications such as Semaglutide (Ozempic, [place]), Tirzepatide  (Mounjaro , Zepbound ), Dulaglutide (Trulicity), etc (GLP1 agonists) AND Canagliflozin (Invokana), Dapagliflozin (Farxiga), Empagliflozin  (Jardiance ), Ertugliflozin (Steglatro), Bexagliflozin [employer]) or any combination with one of these drugs such as Invokamet (Canagliflozin/Metformin ), Synjardy (Empagliflozin /Metformin ), etc (SGLT2  inhibitors) must be held around the time of a procedure. This is not a comprehensive list of all of these drugs. Please review all of your medications and talk to your provider if you take any one of these. If you are not sure, ask your provider.           HOLD: Tirzepatide  (Mounjaro , Zepbound ) for 1 week prior to the procedure. Last dose on Wednesday, November 18.        HOLD: Empagliflozin  (Jardiance ) for 3 days prior to the procedure. Last dose on Monday, November 24.        Continue taking your anticoagulant (blood thinner): Apixaban  (Eliquis ).  You will need to continue this after your procedure until you are told by your provider that it is safe to stop.    LABS: CBC/BMET Come to the lab at the [facility] ([address] [place], 1st Floor) between the hours of 8:00 am and 4:30 pm. You do NOT have to be fasting.  FYI:  For your safety, and to allow us  to monitor your vital signs accurately during the surgery/procedure we request: If you have artificial nails, gel coating, SNS etc, please have those removed prior to your surgery/procedure. Not having the nail coverings /polish removed may result in cancellation or delay of your surgery/procedure.  Your support person will be asked to wait in the waiting room during your procedure.  It is OK to have someone drop you off and come back when you are ready to be discharged.  You cannot drive after the procedure and will need someone to drive you home.  Bring your insurance cards.  *Special Note: Every  effort is made to have your procedure done on time. Occasionally there are emergencies that occur at the hospital that may cause delays. Please be patient if a delay does occur.    WatchPAT? is an FDA-cleared portable home sleep study test that uses a watch and 3 points of contact to monitor 7 different channels, including your heart rate, oxygen saturation, body position, snoring, and chest motion.   The study is easy to use from the comfort of your own home and accurately detect sleep apnea.  Before bed, you attach the chest sensor, attached the sleep apnea bracelet to your nondominant hand, and attach the finger probe.  After the study, the raw data is downloaded from the watch and scored for apnea events.   For more information: [email or web address]  Patient Testing Instructions:  Once our office has received insurance approval for you to complete this test, we will contact you with a PIN to activate the device.  This typically takes 2-3 weeks.  Please do not open the box until approved.   Do not put battery into the device until bedtime when you are ready to begin the test. Please call the support number if you need assistance after following the instructions below: 24 hour support line- [phone] or [facility] support at [phone] (option 2)  Download the [facility] WatchPAT One app through the [employer] or [employer]. Be sure to turn on or enable access to Bluetooth in settings on your smartphone. Make sure no other Bluetooth devices are on and within the vicinity of your smartphone and WatchPAT watch during testing.  Make sure to leave your smart phone plugged in and charging all night.  When ready for bed:  Follow the instructions step by step in the WatchPAT One app to activate the testing device. For additional instructions, including video instruction, visit the WatchPAT One video on Youtube. You can search for WatchPAT One within Youtube (video is 4 minutes and 18 seconds) or enter: https://youtube/watch?[email or web address]_vbiwxE Please note: You will be prompted to enter a PIN to connect via Bluetooth when starting the test. The PIN will be assigned to you after insurance has approved the test.  The device is disposable, but it recommended that you retain the device until you receive a call letting you know the study has been received and the results have been  interpreted.  We will let you know if the study did not transmit to us  properly after the test is completed. You do not need to call us  to confirm the receipt of the test.  Please complete the test within 48 hours of receiving PIN.   Frequently Asked Questions:  What is Watch PAT One?  A single use, fully disposable home sleep apnea testing device and will not need to be returned after completion.  What are the requirements to use WatchPAT One?  A successful WatchPAT One sleep study requires a WatchPAT One device, your smart phone, WatchPAT One app, your PIN number, and internet access. What type of phone do I need?  You should have a smart phone that uses Android 5.1 and above or any iPhone with IOS 10 and above. How can I download the WatchPAT one app?  Based on your device type search for WatchPAT One app either in [employer] for [employer] or [employer] for [employer]. Where will I get my PIN for the study?  Your PIN will be provided by your physician's office after insurance has approved the test.  This process typically takes 2-3 weeks. It is used for authentication and if you lose/forget your PIN, please reach out to your provider's office.  I do not have internet at home. Can I still complete a WatchPAT One study?  WatchPAT One needs internet connection throughout the night to be able to transmit the sleep data. You can use your home/local internet or your cellular data package. However, it is always recommended to use home/local internet. It is estimated that between 20MB-30MB of data will be used with each study, but the application will be looking for space in the phone to start the study.  What happens if I lose internet or Bluetooth connection?  During the internet disconnection, your phone will not be able to transmit the sleep data.  All the data, will be stored in your phone.  As soon as the internet connection is back on, the phone will resume sending the sleep  data. During the Bluetooth disconnection, WatchPAT One will not be able to to send the sleep data to your phone.  Data will be kept in the WatchPAT One until both devices have Bluetooth connection back on.  As soon as the connection is back on, WatchPAT one will send the sleep data to the phone.  How long do I need to wear the WatchPAT one?  After you start the study, you should wear the device at least 6 hours.  How far should I keep my phone from the device?  During the night, your phone should remain within 15 feet of where you sleep.  What happens if I leave the room for restroom or other reasons?  Leaving the room for any reason will not cause any problem. As soon as your get back to the room, both devices will reconnect and will continue to send the sleep data. Can I use my phone during the sleep study?  Yes, you can use your phone as usual during the study. But it is recommended to put your WatchPAT One on when you are ready to go to bed.  How will I get my study results?  A soon as you completed your study, your sleep data will be sent to the provider. They will then share the results with you when they are ready.    Follow-Up: At [facility], you and your health needs are our priority.  As part of our continuing mission to provide you with exceptional heart care, our providers are all part of one team.  This team includes your primary Cardiologist (physician) and Advanced Practice Providers or APPs (Physician Assistants and Nurse Practitioners) who all work together to provide you with the care you need, when you need it.  Your next appointment:   1 year(s)  Provider:   Dr. Wilbert Bihari, MD   We recommend signing up for the patient portal called MyChart.  Sign up information is provided on this After Visit Summary.  MyChart is used to connect with patients for Virtual Visits (Telemedicine).  Patients are able to view lab/test results, encounter notes, upcoming  appointments, etc.  Non-urgent messages can be sent to your provider as well.   To learn more about what you can do with MyChart, go to [email or web address].   Other Instructions Dr. Bihari has referred you to the atrial fibrillation clinic. Please make an appointment with them before you leave today.

## 2024-03-21 NOTE — Progress Notes (Addendum)
 Sleep Medicine CONSULT Note    Date:  03/21/2024   ID:  Randy [name], DOB 05/13/1970, MRN [id number]  PCP:  Pcp, No  Cardiologist: None   Chief Complaint  Patient presents with   New Patient (Initial Visit)    Obstructive sleep apnea    History of Present Illness:  Randy [name] is a 53 y.o. male who is being seen today for the evaluation of obstructive sleep apnea at the request of Delon Gainer, FNP.  This is a 53 year old male with a history of HFrEF, PAF/flutter status post ablation with PVI, hypertension, CKD stage IIIa, hyperlipidemia, diabetes mellitus 2, CVA and obstructive sleep apnea.  He apparently had been diagnosed with sleep apnea in the past and placed on CPAP but I do not have any records available.  When he was seen by Harlene Gainer, NP in 08/06/2023 he was not wearing his CPAP and therefore is now referred for sleep medicine consultation for treatment of obstructive sleep apnea.  He tells me that he was dx with OSA years ago in [place].  A CPAP was ordered but he never got it. He says that he got a Rx for it but moved to [place] and could not find anyone to set him up with a machine and it was years ago.  He feels rested in the am since losing over 70lbs but does get sleepy during the day. He has not been told that he snore.  He says that his wife snores so she would not know. He has no AM HAs.  Given his multiple comorbidities the AHF team is concerned that he may still have residual OSA.    Past Medical History:  Diagnosis Date   CKD (chronic kidney disease) stage 3, GFR 30-59 ml/min (HCC)    baseline Cr 1.5-1.7   Decreased visual acuity    Left eye, resolved - hypertensive retinopathy   Diabetes mellitus 2012   Type II   Hilar adenopathy    on CT scan 02/2010, on rpt scan stable/improved.   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   Systolic murmur    Vitamin D  deficiency     Past Surgical History:  Procedure Laterality Date   AMPUTATION Right 09/17/2021   Procedure: First ray amputation;  Surgeon: Harden Jerona GAILS, MD;  Location[facility];  Service: Orthopedics;  Laterality: Right;   APPLICATION OF WOUND VAC Right 10/10/2021   Procedure: APPLICATION OF WOUND VAC;  Surgeon: Harden Jerona GAILS, MD;  Location: [facility] OR;  Service: Orthopedics;  Laterality: Right;   ATRIAL FIBRILLATION ABLATION N/A 10/02/2022   Procedure: ATRIAL FIBRILLATION ABLATION;  Surgeon: Inocencio Soyla Lunger, MD;  Location: [facility] INVASIVE CV LAB;  Service: Cardiovascular;  Laterality: N/A;   ATRIAL FIBRILLATION ABLATION N/A 07/08/2023   Procedure: ATRIAL FIBRILLATION ABLATION;  Surgeon: Inocencio Soyla Lunger, MD;  Location: [facility] INVASIVE CV LAB;  Service: Cardiovascular;  Laterality: N/A;   CARDIOVERSION N/A 01/31/2021   Procedure: CARDIOVERSION;  Surgeon: Cherrie Toribio SAUNDERS, MD;  Location: [facility] ENDOSCOPY;  Service: Cardiovascular;  Laterality: N/A;   CARDIOVERSION N/A 07/04/2021   Procedure: CARDIOVERSION;  Surgeon: Cherrie Toribio SAUNDERS, MD;  Location: [facility] ENDOSCOPY;  Service: Cardiovascular;  Laterality: N/A;   hospitalization  11/2008   malignant HTN, nl SPEP/UPEP, neg ANCA panel, nl C3/4, neg anti GBM Ab, neg Hep A/B/C, nl renal US , nl PTH, neg HIV   I & D EXTREMITY Right 10/10/2021   Procedure: RIGHT FOOT DEBRIDEMENT;  Surgeon: Harden Jerona GAILS, MD;  Location: [facility] OR;  Service: Orthopedics;  Laterality: Right;   RIGHT/LEFT HEART CATH AND CORONARY ANGIOGRAPHY N/A 01/29/2021   Procedure: RIGHT/LEFT HEART CATH AND CORONARY ANGIOGRAPHY;  Surgeon: Cherrie Toribio SAUNDERS, MD;  Location: [facility] INVASIVE CV LAB;  Service: Cardiovascular;  Laterality: N/A;   TEE WITHOUT CARDIOVERSION N/A 01/31/2021    Procedure: TRANSESOPHAGEAL ECHOCARDIOGRAM (TEE);  Surgeon: Cherrie Toribio SAUNDERS, MD;  Location: [facility] ENDOSCOPY;  Service: Cardiovascular;  Laterality: N/A;   TEE WITHOUT CARDIOVERSION N/A 10/02/2022   Procedure: TRANSESOPHAGEAL ECHOCARDIOGRAM;  Surgeon: Inocencio Soyla Lunger, MD;  Location: [facility] INVASIVE CV LAB;  Service: Cardiovascular;  Laterality: N/A;   US  ECHOCARDIOGRAPHY  11/2008   LVsys fxn EF 50%, mild MR, normal LV size, neg ANA, neg cryoglobulins   US  ECHOCARDIOGRAPHY  2011   severe LVH, EF 40%, LA mildly dilated, PA pressure moderately increased    Current Medications: Current Meds  Medication Sig   apixaban  (ELIQUIS ) 5 MG TABS tablet Take 1 tablet (5 mg total) by mouth 2 (two) times daily.   atorvastatin  (LIPITOR) 20 MG tablet Take 1 tablet (20 mg total) by mouth daily.   Continuous Glucose Sensor (FREESTYLE LIBRE 3 PLUS SENSOR) MISC Use to continuously monitor blood glucose, changing the sensor every 15 days,   diltiazem  (CARDIZEM  CD) 360 MG 24 hr capsule Take 1 capsule (360 mg total) by mouth daily.   empagliflozin  (JARDIANCE ) 10 MG TABS tablet Take 1 tablet (10 mg total) by mouth daily.   furosemide  (LASIX ) 20 MG tablet Take 1 tablet (20 mg total) by mouth as needed.   isosorbide -hydrALAZINE  (BIDIL ) 20-37.5 MG tablet Take 2 tablets by mouth 3 (three) times daily.   metoprolol  succinate (TOPROL -XL) 100 MG 24 hr tablet Take 1 tablet (100 mg total) by mouth at bedtime. Take with or immediately following a meal.   tirzepatide  (MOUNJARO ) 12.5 MG/0.5ML Pen Inject 12.5 mg into the skin once a week.    Allergies:   Fish protein-containing drug products and Shellfish allergy   Social History   Socioeconomic History   Marital status: Married    Spouse name: Randy [name]   Number of children: 3   Years of education: Not on file   Highest education level: Bachelor's degree (e.g., BA, AB, BS)  Occupational History   Occupation: [employer] Manager    Comment: [place]  Tobacco Use    Smoking status: Former    Types: Cigars   Smokeless tobacco: Never   Tobacco comments:    Former smoker 01/01/23  Vaping Use   Vaping status: Never Used  Substance and Sexual Activity   Alcohol use: Not Currently    Comment: stop drinking 01/01/23   Drug use: No   Sexual activity: Not on file  Other Topics Concern   Not on file  Social History Narrative   Newly married, 2012, 1 daughter, 1 son   No injectable steroid cycles   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, 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)   Regular exercise-yes   Social Drivers of Health   Financial Resource Strain: Low Risk  (01/27/2021)   Overall Physicist, Medical Strain (  CARDIA)    Difficulty of Paying Living Expenses: Not hard at all  Food Insecurity: No Food Insecurity (02/11/2023)   Hunger Vital Sign    Worried About Running Out of Food in the Last Year: Never true    Ran Out of Food in the Last Year: Never true  Transportation Needs: No Transportation Needs (02/11/2023)   PRAPARE - Administrator, Civil Service (Medical): No    Lack of Transportation (Non-Medical): No  Physical Activity: Not on file  Stress: Not on file  Social Connections: Unknown (09/06/2021)   Received from [facility]   Social Network    Social Network: Not on file     Family History:  The patient's family history includes Diabetes in his father and sister; Hypertension in his brother, father, and mother; Kidney disease in his paternal grandmother.   ROS:   Please see the history of present illness.    ROS All other systems reviewed and are negative.      No data to display          PHYSICAL EXAM:   VS:  BP (!) 118/92   Pulse (!) 117   Ht 6' 8 (2.032 m)   Wt (!) 374 lb (169.6 kg)   SpO2 98%   BMI 41.09 kg/m    GEN: Well nourished, well developed, in no acute distress  HEENT: normal  Neck: no JVD, carotid bruits, or masses Cardiac: irregularly irregular and tachy; no murmurs, rubs, or  gallops,no edema.  Intact distal pulses bilaterally.  Respiratory:  clear to auscultation bilaterally, normal work of breathing GI: soft, nontender, nondistended, + BS MS: no deformity or atrophy  Skin: warm and dry, no rash Neuro:  Alert and Oriented x 3, Strength and sensation are intact Psych: euthymic mood, full affect  Wt Readings from Last 3 Encounters:  03/21/24 (!) 374 lb (169.6 kg)  10/19/23 (!) 368 lb (166.9 kg)  10/01/23 (!) 366 lb 12.8 oz (166.4 kg)      Studies/Labs Reviewed:   None  Recent Labs: 04/07/2023: ALT 20 07/16/2023: Hemoglobin 14.0; Platelets 333 08/06/2023: B Natriuretic Peptide 291.0 08/13/2023: BUN 22; Creatinine, Ser 1.92; Potassium 4.5; Sodium 139 10/01/2023: TSH 1.12    CHA2DS2-VASc Score = 6   This indicates a 9.7% annual risk of stroke. The patient's score is based upon: CHF History: 1 HTN History: 1 Diabetes History: 1 Stroke History: 2 Vascular Disease History: 1 Age Score: 0 Gender Score: 0      ASSESSMENT:    1. OSA (obstructive sleep apnea)   2. Essential hypertension   3. Persistent atrial fibrillation (HCC)      PLAN:  In order of problems listed above:  OSA  - Patient apparently diagnosed with sleep apnea in the past and had been on CPAP but stopped using.  I do not any records on this patient in regards to prior sleep studies - Given his multiple cardiac comorbidities if he does have significant sleep apnea it needs to be treated - Recommend HST as he was not able to sleep at the sleep lab on his initial study  Hypertension - BP borderline controlled on exam today at 118/92 mmHg - Continue Cardizem  CD 360 mg daily, Bidil  20-37.5 mg 2 tablets 3 times daily, Toprol  XL 100 mg daily, Entresto  49-51 mg twice daily and spironolactone  12.5 mg daily with as needed refills  Atypical atrial flutter with RVR - He is in irregular rhythm today and EKG confirms atrial flutter  with RVR to 120 bpm - He has not taken his Cardizem  CD  today - I encouraged him to go home and immediately take both Cardizem  and am Eliquis  - He has not missed any doses of Eliquis  so was set up for a cardioversion tomorrow 03/22/2024 - He is on Monjaurno and took his last dose last Wednesday (will be 1 week tomorrow) -Informed Consent   Shared Decision Making/Informed Consent The risks (stroke, cardiac arrhythmias rarely resulting in the need for a temporary or permanent pacemaker, skin irritation or burns and complications associated with conscious sedation including aspiration, arrhythmia, respiratory failure and death), benefits (restoration of normal sinus rhythm) and alternatives of a direct current cardioversion were explained in detail to Mr. Vanamburg and he agrees to proceed.  -Discussed with Dr. Inocencio and after cardioversion will get back in A-fib clinic  Time Spent: 20 minutes total time of encounter, including 15 minutes spent in face-to-face patient care on the date of this encounter. This time includes coordination of care and counseling regarding above mentioned problem list. Remainder of non-face-to-face time involved reviewing chart documents/testing relevant to the patient encounter and documentation in the medical record. I have independently reviewed documentation from referring provider  Medication Adjustments/Labs and Tests Ordered: Current medicines are reviewed at length with the patient today.  Concerns regarding medicines are outlined above.  Medication changes, Labs and Tests ordered today are listed in the Patient Instructions below.  There are no Patient Instructions on file for this visit.   Signed, Wilbert Bihari, MD  03/21/2024 9:50 AM    [facility] [address] [place], [place], [place]  [address] Phone: [phone]; Fax: [phone]

## 2024-03-22 ENCOUNTER — Ambulatory Visit (HOSPITAL_COMMUNITY): Admission: RE | Admit: 2024-03-22 | Source: Home / Self Care

## 2024-03-22 ENCOUNTER — Encounter (HOSPITAL_COMMUNITY): Admission: RE | Payer: Self-pay | Source: Home / Self Care

## 2024-03-22 ENCOUNTER — Telehealth: Payer: Self-pay | Admitting: Cardiology

## 2024-03-22 LAB — CBC WITH DIFFERENTIAL/PLATELET
Basophils Absolute: 0.1 x10E3/uL (ref 0.0–0.2)
Basos: 1 %
EOS (ABSOLUTE): 0.2 x10E3/uL (ref 0.0–0.4)
Eos: 2 %
Hematocrit: 46.9 % (ref 37.5–51.0)
Hemoglobin: 14.7 g/dL (ref 13.0–17.7)
Immature Grans (Abs): 0 x10E3/uL (ref 0.0–0.1)
Immature Granulocytes: 0 %
Lymphocytes Absolute: 3.8 x10E3/uL — ABNORMAL HIGH (ref 0.7–3.1)
Lymphs: 43 %
MCH: 26.7 pg (ref 26.6–33.0)
MCHC: 31.3 g/dL — ABNORMAL LOW (ref 31.5–35.7)
MCV: 85 fL (ref 79–97)
Monocytes Absolute: 0.9 x10E3/uL (ref 0.1–0.9)
Monocytes: 10 %
Neutrophils Absolute: 3.9 x10E3/uL (ref 1.4–7.0)
Neutrophils: 44 %
Platelets: 269 x10E3/uL (ref 150–450)
RBC: 5.5 x10E6/uL (ref 4.14–5.80)
RDW: 13.5 % (ref 11.6–15.4)
WBC: 8.8 x10E3/uL (ref 3.4–10.8)

## 2024-03-22 LAB — BASIC METABOLIC PANEL WITH GFR
BUN/Creatinine Ratio: 16 (ref 9–20)
BUN: 35 mg/dL — ABNORMAL HIGH (ref 6–24)
CO2: 21 mmol/L (ref 20–29)
Calcium: 9.7 mg/dL (ref 8.7–10.2)
Chloride: 103 mmol/L (ref 96–106)
Creatinine, Ser: 2.24 mg/dL — ABNORMAL HIGH (ref 0.76–1.27)
Glucose: 88 mg/dL (ref 70–99)
Potassium: 4.9 mmol/L (ref 3.5–5.2)
Sodium: 140 mmol/L (ref 134–144)
eGFR: 35 mL/min/1.73 — ABNORMAL LOW (ref >59)

## 2024-03-22 SURGERY — CARDIOVERSION (CATH LAB)
Anesthesia: General

## 2024-03-22 NOTE — Telephone Encounter (Signed)
 Called and spoke to pt. He states that he called on 03/21/24 also to notify of DCCV cancellation. He did call Dr. Nelle office and was able to reschedule procedure for 04/03/24 (to be done by Dr. Cherrie).   Cath lab notified who stated they were aware of cancellation.

## 2024-03-22 NOTE — Telephone Encounter (Signed)
 Pt called in to cancel his cardioversion today. Pt states he is going to r/s with Dr. Bensimhon

## 2024-03-25 ENCOUNTER — Ambulatory Visit: Payer: Self-pay | Admitting: Cardiology

## 2024-03-27 ENCOUNTER — Other Ambulatory Visit (HOSPITAL_COMMUNITY): Payer: Self-pay

## 2024-03-27 ENCOUNTER — Other Ambulatory Visit: Payer: Self-pay

## 2024-04-03 ENCOUNTER — Ambulatory Visit: Admitting: Internal Medicine

## 2024-04-03 NOTE — Progress Notes (Deleted)
 Patient ID: Jacquese Hackman, male   DOB: April 05, 1971, 53 y.o.   MRN: [id number]  HPI: Alven Alverio is a 53 y.o.-year-old male, initially referred by his cardiologist, Dr. 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  Last visit 6 months ago.  Interim history: No increased urination, blurry vision, nausea, chest pain.  DM2: Reviewed his HbA1c levels: Lab Results  Component Value Date   HGBA1C 5.7 (A) 10/01/2023   HGBA1C 5.4 05/13/2023   HGBA1C 6.5 (H) 02/11/2023   HGBA1C 5.8 (A) 12/22/2022   HGBA1C 6.4 (A) 08/12/2022   HGBA1C 5.9 (A) 05/06/2022   HGBA1C 10.1 (H) 09/14/2021   HGBA1C 6.3 (H) 01/27/2021   HGBA1C 7.4 (H) 08/08/2020   HGBA1C 12.5 (H) 11/28/2014   He is on a regimen of: - Jardiance  10 mg before breakfast - Mounjaro  15 mg weekly - started 11/2021 >> 12.5 mg weekly Metformin  stopped 2/2 CKD, AP. He has not been on insulin .  Pt checks his sugars 0-1x a day: - am: 109-120 >> 80, 85-90s >> 90s-105 - 2h after b'fast: n/c - before lunch: n/c - 2h after lunch: 110-120 >> n/c - before dinner: n/c - 2h after dinner: 160s,180 (cake) >> 140s-160 >> 160-200 - bedtime: n/c - nighttime: n/c Lowest sugar was 40 >> ... And 90; he has hypoglycemia awareness at 40.  Highest sugar was 180 >> 160 >> 200  Glucometer:One Touch  Pt's meals are: - Breakfast: steak biscuit  - Lunch: eating out - Dinner: skips, or veggies - Snacks: used to drink gatorade, not anymore  - + CKD-sees nephrology, last BUN/creatinine:  Lab Results  Component Value Date   BUN 35 (H) 03/21/2024   BUN 22 (H) 08/13/2023   CREATININE 2.24 (H) 03/21/2024   CREATININE 1.92 (H) 08/13/2023   Lab Results  Component Value Date   MICRALBCREAT 81.8 (H) 05/29/2011  He is on Entresto .  - + HL; last set of lipids: Lab  Results  Component Value Date   CHOL 123 08/13/2023   HDL 66 08/13/2023   LDLCALC 48 08/13/2023   LDLDIRECT 79.0 11/28/2014   TRIG 46 08/13/2023   CHOLHDL 1.9 08/13/2023  He is on Lipitor 20 mg daily.  - last eye exam was in 11/2022. No DR reportedly. IOP is elevated, stable. [facility].  - no numbness and tingling in his feet. Last foot exam 10/01/2023 He had a R foot ulcer for which he was seeing orthopedics - healing.  However, he developed cellulitis and was also admitted for sepsis in 12/2022.  He mentions that he was in the hospital for a month.  This healed.  Pt has FH of DM - brother, sister, father.  Amiodarone -induced thyrotoxicosis:  Amiodarone  was stopped 11/2021 due to thyrotoxicosis.  04/15/2022: following regimen started by Dr. Bensimhon: - Methimazole  5 mg twice a day - Prednisone  20 mg daily  04/2022: We decreased the doses: - Methimazole  5 mg once a day - Prednisone  10 mg daily  07/2022:  - stopped MMI - Reduced prednisone  to 5 mg daily  09/2022: - Continued prednisone  5 mg daily as his fT4 was slightly elevated However, he stopped as he run out of the Rx 10/2022.  Reviewed TFTs: Lab Results  Component Value Date   TSH 1.12 10/01/2023   TSH 0.83 05/13/2023   TSH 1.08 12/22/2022   TSH 0.78 10/08/2022  TSH 0.88 08/12/2022   TSH 0.03 (L) 05/06/2022   TSH 0.010 (L) 04/22/2022   TSH <0.010 (L) 03/25/2022   TSH <0.010 (L) 02/03/2022   TSH 0.103 (L) 11/21/2021   TSH 0.90 09/05/2010   TSH 1.345 03/13/2010   Lab Results  Component Value Date   FREET4 1.6 10/01/2023   FREET4 1.8 05/13/2023   FREET4 1.39 12/22/2022   FREET4 1.72 (H) 10/08/2022   FREET4 1.51 08/12/2022   FREET4 1.83 (H) 05/06/2022   FREET4 2.08 (H) 04/22/2022   FREET4 2.89 (H) 03/25/2022   FREET4 2.50 (H) 02/03/2022   FREET4 2.20 (H) 12/05/2021   Lab Results  Component Value Date   T3FREE 2.8 10/01/2023   T3FREE 3.3 05/13/2023   T3FREE 3.4 12/22/2022   T3FREE 2.8  10/08/2022   T3FREE 3.1 08/12/2022   T3FREE 3.8 05/06/2022   T3FREE 2.3 04/22/2022   T3FREE 3.4 03/25/2022   T3FREE 2.0 02/03/2022   T3FREE 2.6 12/05/2021   Lab Results  Component Value Date   TSI <89 05/06/2022   No FH of thyroid  cancer. (Wife has Graves ds). No h/o radiation tx to head or neck. No recent contrast studies. No herbal supplements. No Biotin use.   CTA neck (07/28/2020): Chronic moderate diffuse enlargement of the thyroid  gland without significant heterogeneity or a discrete nodule and for which no imaging follow-up is recommended.  Pt denies: - feeling nodules in neck - hoarseness - dysphagia - choking  She also has a history of significant HTN, obesity- 450 lbs at his highest weight. He ha atrial fibrillation and had a cardiac ablation in 09/2022 and 06/2023.  ROS: + See HPI  Past Medical History:  Diagnosis Date   CKD (chronic kidney disease) stage 3, GFR 30-59 ml/min (HCC)    baseline Cr 1.5-1.7   Decreased visual acuity    Left eye, resolved - hypertensive retinopathy   Diabetes mellitus 2012   Type II   Hilar adenopathy    on CT scan 02/2010, on rpt scan stable/improved.   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   Systolic murmur    Vitamin D  deficiency    Past Surgical History:  Procedure Laterality Date   AMPUTATION Right 09/17/2021   Procedure: First ray amputation;  Surgeon: Harden Jerona GAILS, MD;  Location: [facility] OR;  Service: Orthopedics;  Laterality: Right;   APPLICATION OF WOUND VAC Right 10/10/2021   Procedure: APPLICATION OF WOUND VAC;  Surgeon: Harden Jerona GAILS, MD;  Location: [facility] OR;  Service: Orthopedics;  Laterality: Right;   ATRIAL  FIBRILLATION ABLATION N/A 10/02/2022   Procedure: ATRIAL FIBRILLATION ABLATION;  Surgeon: Inocencio Soyla Lunger, MD;  Location: [facility] INVASIVE CV LAB;  Service: Cardiovascular;  Laterality: N/A;   ATRIAL FIBRILLATION ABLATION N/A 07/08/2023   Procedure: ATRIAL FIBRILLATION ABLATION;  Surgeon: Inocencio Soyla Lunger, MD;  Location: [facility] INVASIVE CV LAB;  Service: Cardiovascular;  Laterality: N/A;   CARDIOVERSION N/A 01/31/2021   Procedure: CARDIOVERSION;  Surgeon: Cherrie Toribio SAUNDERS, MD;  Location: [facility] ENDOSCOPY;  Service: Cardiovascular;  Laterality: N/A;   CARDIOVERSION N/A 07/04/2021   Procedure: CARDIOVERSION;  Surgeon: Cherrie Toribio SAUNDERS, MD;  Location: [facility] ENDOSCOPY;  Service: Cardiovascular;  Laterality: N/A;   hospitalization  11/2008   malignant HTN, nl SPEP/UPEP, neg ANCA panel, nl C3/4, neg anti GBM Ab, neg Hep A/B/C, nl renal US , nl PTH, neg HIV   I & D EXTREMITY Right 10/10/2021   Procedure: RIGHT FOOT DEBRIDEMENT;  Surgeon: Harden Jerona GAILS, MD;  Location: [facility] OR;  Service: Orthopedics;  Laterality: Right;   RIGHT/LEFT HEART CATH AND CORONARY ANGIOGRAPHY N/A 01/29/2021   Procedure: RIGHT/LEFT HEART CATH AND CORONARY ANGIOGRAPHY;  Surgeon: Cherrie Toribio SAUNDERS, MD;  Location: [facility] INVASIVE CV LAB;  Service: Cardiovascular;  Laterality: N/A;   TEE WITHOUT CARDIOVERSION N/A 01/31/2021   Procedure: TRANSESOPHAGEAL ECHOCARDIOGRAM (TEE);  Surgeon: Cherrie Toribio SAUNDERS, MD;  Location: [facility] ENDOSCOPY;  Service: Cardiovascular;  Laterality: N/A;   TEE WITHOUT CARDIOVERSION N/A 10/02/2022   Procedure: TRANSESOPHAGEAL ECHOCARDIOGRAM;  Surgeon: Inocencio Soyla Lunger, MD;  Location: [facility] INVASIVE CV LAB;  Service: Cardiovascular;  Laterality: N/A;   US  ECHOCARDIOGRAPHY  11/2008   LVsys fxn EF 50%, mild MR, normal LV size, neg ANA, neg cryoglobulins   US  ECHOCARDIOGRAPHY  2011   severe LVH, EF 40%, LA mildly dilated, PA pressure moderately increased   Social History   Socioeconomic History   Marital status: Married     Spouse name: Marti Acebo   Number of children: 3   Years of education: Not on file   Highest education level: Bachelor's degree (e.g., BA, AB, BS)  Occupational History   Occupation: [employer] Manager    Comment: [place]  Tobacco Use   Smoking status: Former    Types: Cigars   Smokeless tobacco: Never   Tobacco comments:    Former smoker 01/01/23  Vaping Use   Vaping status: Never Used  Substance and Sexual Activity   Alcohol use: Not Currently    Comment: stop drinking 01/01/23   Drug use: No   Sexual activity: Not on file  Other Topics Concern   Not on file  Social History Narrative   Newly married, 2012, 1 daughter, 1 son   No injectable steroid cycles   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, 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)   Regular exercise-yes   Social Drivers of Health   Financial Resource Strain: Low Risk  (01/27/2021)   Overall Financial Resource Strain (CARDIA)    Difficulty of Paying Living Expenses: Not hard at all  Food Insecurity: No Food Insecurity (02/11/2023)   Hunger Vital Sign    Worried About Running Out of Food in the Last Year: Never true    Ran Out of Food in the Last Year: Never true  Transportation Needs: No Transportation Needs (02/11/2023)   PRAPARE - Administrator, Civil Service (Medical): No    Lack of Transportation (Non-Medical): No  Physical Activity: Not on file  Stress: Not on file  Social Connections: Unknown (09/06/2021)   Received from [facility]   Social Network    Social Network: Not on file  Intimate Partner Violence: Not At Risk (02/11/2023)   Humiliation, Afraid, Rape, and Kick questionnaire    Fear of Current or Ex-Partner: No    Emotionally Abused: No    Physically Abused: No    Sexually Abused: No   Current Outpatient Medications on File Prior to Visit  Medication Sig Dispense Refill   apixaban  (ELIQUIS ) 5 MG TABS tablet Take 1 tablet (5 mg total) by mouth 2 (two) times daily. 180  tablet 3   atorvastatin  (LIPITOR)  20 MG tablet Take 1 tablet (20 mg total) by mouth daily. 90 tablet 3   Continuous Glucose Sensor (FREESTYLE LIBRE 3 PLUS SENSOR) MISC Use to continuously monitor blood glucose, changing the sensor every 15 days, 6 each 3   diltiazem  (CARDIZEM  CD) 360 MG 24 hr capsule Take 1 capsule (360 mg total) by mouth daily. 90 capsule 2   empagliflozin  (JARDIANCE ) 10 MG TABS tablet Take 1 tablet (10 mg total) by mouth daily. 90 tablet 3   furosemide  (LASIX ) 20 MG tablet Take 1 tablet (20 mg total) by mouth as needed. 30 tablet 3   isosorbide -hydrALAZINE  (BIDIL ) 20-37.5 MG tablet Take 2 tablets by mouth 3 (three) times daily. 545 tablet 3   metoprolol  succinate (TOPROL -XL) 100 MG 24 hr tablet Take 1 tablet (100 mg total) by mouth at bedtime. Take with or immediately following a meal. 90 tablet 3   sacubitril -valsartan  (ENTRESTO ) 49-51 MG Take 1 tablet by mouth 2 (two) times daily. (Patient not taking: Reported on 03/21/2024) 60 tablet 11   spironolactone  (ALDACTONE ) 25 MG tablet Take 0.5 tablets (12.5 mg total) by mouth daily. (Patient not taking: Reported on 03/21/2024) 90 tablet 3   tirzepatide  (MOUNJARO ) 12.5 MG/0.5ML Pen Inject 12.5 mg into the skin once a week. 9 mL 1   No current facility-administered medications on file prior to visit.   Allergies  Allergen Reactions   Fish Protein-Containing Drug Products Anaphylaxis   Shellfish Allergy Anaphylaxis   Family History  Problem Relation Age of Onset   Hypertension Mother    Hypertension Father    Diabetes Father    Diabetes Sister    Hypertension Brother    Kidney disease Paternal Grandmother        ESRD   Coronary artery disease Neg Hx    Stroke Neg Hx    Cancer Neg Hx    PE: There were no vitals taken for this visit. Wt Readings from Last 10 Encounters:  03/21/24 (!) 374 lb (169.6 kg)  10/19/23 (!) 368 lb (166.9 kg)  10/01/23 (!) 366 lb 12.8 oz (166.4 kg)  08/06/23 (!) 374 lb 9.6 oz (169.9 kg)   08/05/23 (!) 368 lb 12.8 oz (167.3 kg)  07/16/23 (!) 362 lb 9.6 oz (164.5 kg)  07/08/23 (!) 370 lb (167.8 kg)  05/17/23 (!) 364 lb 6.4 oz (165.3 kg)  05/13/23 (!) 371 lb (168.3 kg)  04/07/23 (!) 360 lb 9.6 oz (163.6 kg)   Constitutional: overweight, in NAD Eyes:  no exophthalmos ENT: no neck masses, no cervical lymphadenopathy Cardiovascular: RRR, No MRG, + significant bilateral leg swelling (R>L) Respiratory: CTA B Musculoskeletal: no deformities except absent right hallux Skin:no rashes Neurological: no tremor with outstretched hands Diabetic Foot Exam - Simple   No data filed    ASSESSMENT: 1. DM2, non-insulin -dependent, uncontrolled, with complications - CHF - A-fib with RVR - h/o CVA, CKD - osteomyelitis of the right foot, history of hallux amputation  2.  Amiodarone -induced thyrotoxicosis  PLAN:  1. Patient with longstanding, type 2 diabetes, on oral antidiabetic regimen with SGLT2 inhibitor and also weekly GLP-1/GIP receptor agonist, with improving control.  At last visit, HbA1c was 5.7%, slightly higher, but still excellent.  Sugars were mostly at goal, with occasional higher values after dinner.  I did not suggest a change in regimen but we discussed about possibly using the CGM and he appears to be interested in this.  He was under the impression that the device can monitor his location and this was the  only reason why he declined this in the past.  I advised him that this was not the case and he agreed to try a CGM. CGM interpretation: -At today's visit, we reviewed his CGM downloads: It appears that *** of values are in target range (goal >70%), while *** are higher than 180 (goal <25%), and *** are lower than 70 (goal <4%).  The calculated average blood sugar is ***.  The projected HbA1c for the next 3 months (GMI) is ***. -Reviewing the CGM trends, ***  Patient Instructions  Please continue: - Jardiance  10 mg daily in am - Mounjaro  12.5 mg weekly  Please stop at  the lab.  Please return in 4-6 months.  - we checked his HbA1c: 7%  - advised to check sugars at different times of the day - 4x a day, rotating check times - advised for yearly eye exams >> he is UTD - return to clinic in 4-6 months  2.  Amiodarone  induced thyrotoxicosis (AIT) -Patient with history of undetectable TSH and high free T4 in the setting of amiodarone  treatment.  He did not have thyrotoxic symptoms: Weight loss, heat intolerance, hyperdefecation, palpitations, anxiety, and in fact he had a 21 pound weight gain possibly also related to fluid overload.  Amiodarone  was stopped in 11/2021 due to thyrotoxicosis.  At that time, he was started on methimazole  5 mg twice a day and prednisone  20 mg daily.  He was also on Toprol -XL 100 mg daily.  When I first saw him, he did not have any apparent culprit for thyrotoxicosis other than amiodarone .  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  We discussed that a type II AIT was usually managed by prednisone  so I advised him to decrease the methimazole  dose.  We decreased the dose of prednisone  further and he was able to come off the steroids subsequently (he actually ran out of prednisone  and did not refill it).  Fortunately, TFTs remained normal afterwards including at last visit in 09/2023. -He denied thyrotoxic signs or symptoms today. - Will recheck his TFTs at today's visit  Lela Fendt, MD PhD [facility]

## 2024-04-04 ENCOUNTER — Ambulatory Visit (HOSPITAL_COMMUNITY): Admitting: Internal Medicine

## 2024-04-04 ENCOUNTER — Encounter (HOSPITAL_COMMUNITY): Payer: Self-pay

## 2024-04-06 ENCOUNTER — Ambulatory Visit: Admitting: Internal Medicine

## 2024-04-06 NOTE — Progress Notes (Deleted)
 Patient ID: Randy [name], male   DOB: 04/07/1971, 53 y.o.   MRN: [id number] This note was precharted 04/03/2024.  HPI: Randy [name] is a 53 y.o.-year-old male, initially referred by his cardiologist, Dr. 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  Last visit 6 months ago.  Interim history: No increased urination, blurry vision, nausea, chest pain.  DM2: Reviewed his HbA1c levels: Lab Results  Component Value Date   HGBA1C 5.7 (A) 10/01/2023   HGBA1C 5.4 05/13/2023   HGBA1C 6.5 (H) 02/11/2023   HGBA1C 5.8 (A) 12/22/2022   HGBA1C 6.4 (A) 08/12/2022   HGBA1C 5.9 (A) 05/06/2022   HGBA1C 10.1 (H) 09/14/2021   HGBA1C 6.3 (H) 01/27/2021   HGBA1C 7.4 (H) 08/08/2020   HGBA1C 12.5 (H) 11/28/2014   He is on a regimen of: - Jardiance  10 mg before breakfast - Mounjaro  15 mg weekly - started 11/2021 >> 12.5 mg weekly Metformin  stopped 2/2 CKD, AP. He has not been on insulin .  Pt checks his sugars 0-1x a day: - am: 109-120 >> 80, 85-90s >> 90s-105 - 2h after b'fast: n/c - before lunch: n/c - 2h after lunch: 110-120 >> n/c - before dinner: n/c - 2h after dinner: 160s,180 (cake) >> 140s-160 >> 160-200 - bedtime: n/c - nighttime: n/c Lowest sugar was 40 >> ... And 90; he has hypoglycemia awareness at 40.  Highest sugar was 180 >> 160 >> 200  Glucometer:One Touch  Pt's meals are: - Breakfast: steak biscuit  - Lunch: eating out - Dinner: skips, or veggies - Snacks: used to drink gatorade, not anymore  - + CKD-sees nephrology, last BUN/creatinine:  Lab Results  Component Value Date   BUN 35 (H) 03/21/2024   BUN 22 (H) 08/13/2023   CREATININE 2.24 (H) 03/21/2024   CREATININE 1.92 (H) 08/13/2023   Lab Results  Component Value Date   MICRALBCREAT 81.8 (H) 05/29/2011  He is on  Entresto .  - + HL; last set of lipids: Lab Results  Component Value Date   CHOL 123 08/13/2023   HDL 66 08/13/2023   LDLCALC 48 08/13/2023   LDLDIRECT 79.0 11/28/2014   TRIG 46 08/13/2023   CHOLHDL 1.9 08/13/2023  He is on Lipitor 20 mg daily.  - last eye exam was in 11/2022. No DR reportedly. IOP is elevated, stable. [name].  - no numbness and tingling in his feet. Last foot exam 10/01/2023 He had a R foot ulcer for which he was seeing orthopedics - healing.  However, he developed cellulitis and was also admitted for sepsis in 12/2022.  He mentions that he was in the hospital for a month.  This healed.  Pt has FH of DM - brother, sister, father.  Amiodarone -induced thyrotoxicosis:  Amiodarone  was stopped 11/2021 due to thyrotoxicosis.  04/15/2022: following regimen started by Dr. Bensimhon: - Methimazole  5 mg twice a day - Prednisone  20 mg daily  04/2022: We decreased the doses: - Methimazole  5 mg once a day - Prednisone  10 mg daily  07/2022:  - stopped MMI - Reduced prednisone  to 5 mg daily  09/2022: - Continued prednisone  5 mg daily as his fT4 was slightly elevated However, he stopped as he run out of the Rx 10/2022.  Reviewed TFTs: Lab Results  Component Value Date   TSH 1.12 10/01/2023   TSH 0.83 05/13/2023   TSH 1.08 12/22/2022  TSH 0.78 10/08/2022   TSH 0.88 08/12/2022   TSH 0.03 (L) 05/06/2022   TSH 0.010 (L) 04/22/2022   TSH <0.010 (L) 03/25/2022   TSH <0.010 (L) 02/03/2022   TSH 0.103 (L) 11/21/2021   TSH 0.90 09/05/2010   TSH 1.345 03/13/2010   Lab Results  Component Value Date   FREET4 1.6 10/01/2023   FREET4 1.8 05/13/2023   FREET4 1.39 12/22/2022   FREET4 1.72 (H) 10/08/2022   FREET4 1.51 08/12/2022   FREET4 1.83 (H) 05/06/2022   FREET4 2.08 (H) 04/22/2022   FREET4 2.89 (H) 03/25/2022   FREET4 2.50 (H) 02/03/2022   FREET4 2.20 (H) 12/05/2021   Lab Results  Component Value Date   T3FREE 2.8 10/01/2023   T3FREE 3.3 05/13/2023    T3FREE 3.4 12/22/2022   T3FREE 2.8 10/08/2022   T3FREE 3.1 08/12/2022   T3FREE 3.8 05/06/2022   T3FREE 2.3 04/22/2022   T3FREE 3.4 03/25/2022   T3FREE 2.0 02/03/2022   T3FREE 2.6 12/05/2021   Lab Results  Component Value Date   TSI <89 05/06/2022   No FH of thyroid  cancer. (Wife has Graves ds). No h/o radiation tx to head or neck. No recent contrast studies. No herbal supplements. No Biotin use.   CTA neck (07/28/2020): Chronic moderate diffuse enlargement of the thyroid  gland without significant heterogeneity or a discrete nodule and for which no imaging follow-up is recommended.  Pt denies: - feeling nodules in neck - hoarseness - dysphagia - choking  She also has a history of significant HTN, obesity- 450 lbs at his highest weight. He ha atrial fibrillation and had a cardiac ablation in 09/2022 and 06/2023.  ROS: + See HPI  Past Medical History:  Diagnosis Date   CKD (chronic kidney disease) stage 3, GFR 30-59 ml/min (HCC)    baseline Cr 1.5-1.7   Decreased visual acuity    Left eye, resolved - hypertensive retinopathy   Diabetes mellitus 2012   Type II   Hilar adenopathy    on CT scan 02/2010, on rpt scan stable/improved.   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   Systolic murmur    Vitamin D  deficiency    Past Surgical History:  Procedure Laterality Date   AMPUTATION Right 09/17/2021   Procedure: First ray amputation;  Surgeon: Harden Jerona GAILS, MD;  Location: [facility] OR;  Service: Orthopedics;  Laterality: Right;   APPLICATION OF WOUND VAC Right 10/10/2021   Procedure: APPLICATION OF WOUND VAC;  Surgeon: Harden Jerona GAILS, MD;  Location: [facility] OR;  Service: Orthopedics;   Laterality: Right;   ATRIAL FIBRILLATION ABLATION N/A 10/02/2022   Procedure: ATRIAL FIBRILLATION ABLATION;  Surgeon: Inocencio Soyla Lunger, MD;  Location: [facility] INVASIVE CV LAB;  Service: Cardiovascular;  Laterality: N/A;   ATRIAL FIBRILLATION ABLATION N/A 07/08/2023   Procedure: ATRIAL FIBRILLATION ABLATION;  Surgeon: Inocencio Soyla Lunger, MD;  Location: [facility] INVASIVE CV LAB;  Service: Cardiovascular;  Laterality: N/A;   CARDIOVERSION N/A 01/31/2021   Procedure: CARDIOVERSION;  Surgeon: Cherrie Toribio SAUNDERS, MD;  Location: [facility] ENDOSCOPY;  Service: Cardiovascular;  Laterality: N/A;   CARDIOVERSION N/A 07/04/2021   Procedure: CARDIOVERSION;  Surgeon: Cherrie Toribio SAUNDERS, MD;  Location: [facility] ENDOSCOPY;  Service: Cardiovascular;  Laterality: N/A;   hospitalization  11/2008   malignant HTN, nl SPEP/UPEP, neg ANCA panel, nl C3/4, neg anti GBM Ab, neg Hep A/B/C, nl renal US , nl PTH, neg HIV   I & D EXTREMITY Right 10/10/2021   Procedure: RIGHT FOOT DEBRIDEMENT;  Surgeon: Harden Jerona GAILS, MD;  Location: [facility] OR;  Service: Orthopedics;  Laterality: Right;   RIGHT/LEFT HEART CATH AND CORONARY ANGIOGRAPHY N/A 01/29/2021   Procedure: RIGHT/LEFT HEART CATH AND CORONARY ANGIOGRAPHY;  Surgeon: Cherrie Toribio SAUNDERS, MD;  Location: [facility] INVASIVE CV LAB;  Service: Cardiovascular;  Laterality: N/A;   TEE WITHOUT CARDIOVERSION N/A 01/31/2021   Procedure: TRANSESOPHAGEAL ECHOCARDIOGRAM (TEE);  Surgeon: Cherrie Toribio SAUNDERS, MD;  Location: [facility] ENDOSCOPY;  Service: Cardiovascular;  Laterality: N/A;   TEE WITHOUT CARDIOVERSION N/A 10/02/2022   Procedure: TRANSESOPHAGEAL ECHOCARDIOGRAM;  Surgeon: Inocencio Soyla Lunger, MD;  Location: [facility] INVASIVE CV LAB;  Service: Cardiovascular;  Laterality: N/A;   US  ECHOCARDIOGRAPHY  11/2008   LVsys fxn EF 50%, mild MR, normal LV size, neg ANA, neg cryoglobulins   US  ECHOCARDIOGRAPHY  2011   severe LVH, EF 40%, LA mildly dilated, PA pressure moderately increased   Social History   Socioeconomic History    Marital status: Married    Spouse name: Clinton Dragone   Number of children: 3   Years of education: Not on file   Highest education level: Bachelor's degree (e.g., BA, AB, BS)  Occupational History   Occupation: [employer] Manager    Comment: [place]  Tobacco Use   Smoking status: Former    Types: Cigars   Smokeless tobacco: Never   Tobacco comments:    Former smoker 01/01/23  Vaping Use   Vaping status: Never Used  Substance and Sexual Activity   Alcohol use: Not Currently    Comment: stop drinking 01/01/23   Drug use: No   Sexual activity: Not on file  Other Topics Concern   Not on file  Social History Narrative   Newly married, 2012, 1 daughter, 1 son   No injectable steroid cycles   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, 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)   Regular exercise-yes   Social Drivers of Health   Tobacco Use: Medium Risk (03/21/2024)   Patient History    Smoking Tobacco Use: Former    Smokeless Tobacco Use: Never    Passive Exposure: Not on Actuary Strain: Not on file  Food Insecurity: No Food Insecurity (02/11/2023)   Hunger Vital Sign    Worried About Running Out of Food in the Last Year: Never true    Ran Out of Food in the Last Year: Never true  Transportation Needs: No Transportation Needs (02/11/2023)   PRAPARE - Administrator, Civil Service (Medical): No    Lack of Transportation (Non-Medical): No  Physical Activity: Not on file  Stress: Not on file  Social Connections: Unknown (09/06/2021)   Received from [facility]   Social Network    Social Network: Not on file  Intimate Partner Violence: Not At Risk (02/11/2023)   Humiliation, Afraid, Rape, and Kick questionnaire    Fear of Current or Ex-Partner: No    Emotionally Abused: No    Physically Abused: No    Sexually Abused: No  Depression (PHQ2-9): Not on file  Alcohol Screen: Not on file  Housing: Low Risk (02/11/2023)   Housing    Last  Housing Risk Score: 0  Utilities: Not At Risk (02/11/2023)   AHC Utilities    Threatened with loss of utilities: No  Health Literacy: Not on file   Current Outpatient Medications on File Prior to Visit  Medication Sig Dispense Refill   apixaban  (ELIQUIS ) 5 MG TABS tablet Take 1 tablet (5 mg total) by mouth 2 (two) times daily. 180 tablet 3   atorvastatin  (LIPITOR) 20 MG tablet Take 1 tablet (20 mg total) by mouth daily. 90 tablet 3   Continuous Glucose Sensor (FREESTYLE LIBRE 3 PLUS SENSOR) MISC Use to continuously monitor blood glucose, changing the sensor every 15 days, 6 each 3   diltiazem  (CARDIZEM  CD) 360 MG 24 hr capsule Take 1 capsule (360 mg total) by mouth daily. 90 capsule 2   empagliflozin  (JARDIANCE ) 10 MG TABS tablet Take 1 tablet (10 mg total) by mouth daily. 90 tablet 3   furosemide  (LASIX ) 20 MG tablet Take 1 tablet (20 mg total) by mouth as needed. 30 tablet 3   isosorbide -hydrALAZINE  (BIDIL ) 20-37.5 MG tablet Take 2 tablets by mouth 3 (three) times daily. 545 tablet 3   metoprolol  succinate (TOPROL -XL) 100 MG 24 hr tablet Take 1 tablet (100 mg total) by mouth at bedtime. Take with or immediately following a meal. 90 tablet 3   sacubitril -valsartan  (ENTRESTO ) 49-51 MG Take 1 tablet by mouth 2 (two) times daily. (Patient not taking: Reported on 03/21/2024) 60 tablet 11   spironolactone  (ALDACTONE ) 25 MG tablet Take 0.5 tablets (12.5 mg total) by mouth daily. (Patient not taking: Reported on 03/21/2024) 90 tablet 3   tirzepatide  (MOUNJARO ) 12.5 MG/0.5ML Pen Inject 12.5 mg into the skin once a week. 9 mL 1   No current facility-administered medications on file prior to visit.   Allergies  Allergen Reactions   Fish Protein-Containing Drug Products Anaphylaxis   Shellfish Allergy Anaphylaxis   Family History  Problem Relation Age of Onset   Hypertension Mother    Hypertension Father    Diabetes Father    Diabetes Sister    Hypertension Brother    Kidney disease Paternal  Grandmother        ESRD   Coronary artery disease Neg Hx    Stroke Neg Hx    Cancer Neg Hx    PE: There were no vitals taken for this visit. Wt Readings from Last 10 Encounters:  03/21/24 (!) 374 lb (169.6 kg)  10/19/23 (!) 368 lb (166.9 kg)  10/01/23 (!) 366 lb 12.8 oz (166.4 kg)  08/06/23 (!) 374 lb 9.6 oz (169.9 kg)  08/05/23 (!) 368 lb 12.8 oz (167.3 kg)  07/16/23 (!) 362 lb 9.6 oz (164.5 kg)  07/08/23 (!) 370 lb (167.8 kg)  05/17/23 (!) 364 lb 6.4 oz (165.3 kg)  05/13/23 (!) 371 lb (168.3 kg)  04/07/23 (!) 360 lb 9.6 oz (163.6 kg)   Constitutional: overweight, in NAD Eyes:  no exophthalmos ENT: no neck masses, no cervical lymphadenopathy Cardiovascular: RRR, No MRG, + significant bilateral leg swelling (R>L) Respiratory: CTA B Musculoskeletal: no deformities except absent right hallux Skin:no rashes Neurological: no tremor with outstretched hands  ASSESSMENT: 1. DM2, non-insulin -dependent, uncontrolled, with complications - CHF - A-fib with RVR - h/o CVA, CKD - osteomyelitis of the right foot, history of hallux amputation  2.  Amiodarone -induced thyrotoxicosis  PLAN:  1. Patient with longstanding, type 2 diabetes, on oral antidiabetic regimen with SGLT2 inhibitor and also weekly GLP-1/GIP receptor agonist, with improving control.  At last visit, HbA1c was 5.7%,  slightly higher, but still excellent.  Sugars were mostly at goal, with occasional higher values after dinner.  I did not suggest a change in regimen but we discussed about possibly using the CGM and he appears to be interested in this.  He was under the impression that the device can monitor his location and this was the only reason why he declined this in the past.  I advised him that this was not the case and he agreed to try a CGM. CGM interpretation: -At today's visit, we reviewed his CGM downloads: It appears that *** of values are in target range (goal >70%), while *** are higher than 180 (goal <25%), and  *** are lower than 70 (goal <4%).  The calculated average blood sugar is ***.  The projected HbA1c for the next 3 months (GMI) is ***. -Reviewing the CGM trends, ***  Patient Instructions  Please continue: - Jardiance  10 mg daily in am - Mounjaro  12.5 mg weekly  Please stop at the lab.  Please return in 4-6 months.  - we checked his HbA1c: 7%  - advised to check sugars at different times of the day - 4x a day, rotating check times - advised for yearly eye exams >> he is UTD - return to clinic in 4-6 months  2.  Amiodarone  induced thyrotoxicosis (AIT) -Patient with history of undetectable TSH and high free T4 in the setting of amiodarone  treatment.  He did not have thyrotoxic symptoms: Weight loss, heat intolerance, hyperdefecation, palpitations, anxiety, and in fact he had a 21 pound weight gain possibly also related to fluid overload.  Amiodarone  was stopped in 11/2021 due to thyrotoxicosis.  At that time, he was started on methimazole  5 mg twice a day and prednisone  20 mg daily.  He was also on Toprol -XL 100 mg daily.  When I first saw him, he did not have any apparent culprit for thyrotoxicosis other than amiodarone .  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  We discussed that a type II AIT was usually managed by prednisone  so I advised him to decrease the methimazole  dose.  We decreased the dose of prednisone  further and he was able to come off the steroids subsequently (he actually ran out of prednisone  and did not refill it).  Fortunately, TFTs remained normal afterwards, including at last visit in 09/2023. - No thyrotoxic signs or symptoms today - We will recheck his TFTs at today's visit  Lela Fendt, MD PhD [facility] Endocr[facility]

## 2024-04-07 ENCOUNTER — Ambulatory Visit (HOSPITAL_COMMUNITY)
Admission: RE | Admit: 2024-04-07 | Discharge: 2024-04-07 | Disposition: A | Source: Ambulatory Visit | Attending: Cardiology

## 2024-04-07 ENCOUNTER — Other Ambulatory Visit (HOSPITAL_COMMUNITY): Payer: Self-pay

## 2024-04-07 ENCOUNTER — Encounter (HOSPITAL_COMMUNITY): Payer: Self-pay

## 2024-04-07 VITALS — BP 162/100 | HR 99 | Ht >= 80 in | Wt 385.6 lb

## 2024-04-07 DIAGNOSIS — I4892 Unspecified atrial flutter: Secondary | ICD-10-CM

## 2024-04-07 DIAGNOSIS — I10 Essential (primary) hypertension: Secondary | ICD-10-CM

## 2024-04-07 DIAGNOSIS — G4733 Obstructive sleep apnea (adult) (pediatric): Secondary | ICD-10-CM

## 2024-04-07 DIAGNOSIS — I4819 Other persistent atrial fibrillation: Secondary | ICD-10-CM

## 2024-04-07 DIAGNOSIS — I5022 Chronic systolic (congestive) heart failure: Secondary | ICD-10-CM

## 2024-04-07 LAB — BASIC METABOLIC PANEL WITH GFR
Anion gap: 11 (ref 5–15)
BUN: 26 mg/dL — ABNORMAL HIGH (ref 6–20)
CO2: 21 mmol/L — ABNORMAL LOW (ref 22–32)
Calcium: 9.2 mg/dL (ref 8.9–10.3)
Chloride: 105 mmol/L (ref 98–111)
Creatinine, Ser: 2.14 mg/dL — ABNORMAL HIGH (ref 0.61–1.24)
GFR, Estimated: 37 mL/min — ABNORMAL LOW (ref >60)
Glucose, Bld: 91 mg/dL (ref 70–99)
Potassium: 3.9 mmol/L (ref 3.5–5.1)
Sodium: 137 mmol/L (ref 135–145)

## 2024-04-07 LAB — BRAIN NATRIURETIC PEPTIDE: B Natriuretic Peptide: 94.1 pg/mL (ref 0.0–100.0)

## 2024-04-07 NOTE — Progress Notes (Signed)
 Advanced Heart Failure Clinic Note  PCP: [facility]  Cardiologist: Dr. Cherrie  HPI: [name] is a 53 year old with a history of HFrEF, paroxsymal A fib/atrial flutter s/p ablation with PVI w/ box lesion, HTN, hypertensive retinopathy, CKD Stage IIIa, hyperlipidemia, DMII, Bells palsy, CVA, and OSA.    Admitted in 12/02/2019 with newly diagnosed A fib + ischemic CVA. He did not meet criteria for IV tPA. MRI showed large acute focal infarct, left frontal lobe predominantly ACA distribution. CT angiogram of the head and neck performed revealing no significant carotid, vertebral or intracranial stenosis. Echocardiogram obtained with bubble study showing no cardiac etiology for stroke. LVEF was 40 to 45%.   Admitted 10/22 with A/C HFrEF and A fib RVR.  Echo showed EF down to 30-35% with speckled appearance concerning for amyloid. Once diuresed and on IV amio, had TEE-DC-CV with restoration of NSR. RHC/LHC with min obs CAD, elevated filling pressures and normal cardiac output.  Diuresed 40 pounds. Discharge weight 367 pound.  Echo 3/23 EF 55% severe LVH.  Admitted 5/23 with osteomyelitis R great toe and cellulitis RLE. S/p R first ray amputation + placement wound VAC. Wound later dehisced and underwent surgical debridement 6/23.   Amiodarone  stopped in 8/23 due to hyperthyroidism. Started on methimazole  and prednisone .    He underwent Afib/flutter ablation with PVI/box lesion ablation in 06/24. TEE at that time with EF 25-30% in setting of AF with RVR.  In AFL with RVR 8/24. He was seen in ED and EP consulted. He was off metoprolol . Toprol  Xl restarted.   Echo 01/15/23: EF 50-55%, severe LVH, RV mildly reduced, severe LAE  Admitted 9/24 with sepsis secondary to RLE cellulitis. He remained in Afib/flutter with RVR. Seen by EP, beta blocker increased and cardizem  added. Plan for repeat ablation as outpatient. Unfortunately, he was admitted again in 10/24 d/t RLE cellulitis with abscess.  He was treated with IV abx and did not require surgical intervention.  S/p AF ablation 3/25.  EP f/u 6/25 he was in NSR  11/25 Seen in Sleep clinic to discuss OSA treatment and was noted to be back in AFL low 100s.   Today he returns for HF follow up. He has had nearly 20 lb wt gain over the last several months but denies any significant change in dyspnea. No resting dyspnea. Reports stable NYHA class II symptoms. No LEE. He thinks wt gain is caloric.   BP elevated, 162/100. Took AM meds but has yet to take mid day dose of Bidil .   EKG shows persistent AFL 110 bpm. Asymptomatic     Cardiac Testing - Echo (9/24): EF 50-55%  - Echo (3/23): EF 55%, severe LVH  - Echo (10/22): EF 30-35% specked appearance concerning for amyloid. RV normal. LA severely dilated. Severe LVH.   - Echo (8/21): EF 40-45% and severe LVH   - R/LHC (10/22)  Ao = 163/108 (134) LV = 136/25 RA =  15 RV = 51/13 PA = 57/19 (39) PCW = 28 Fick cardiac output/index = 9.2/3.0 PVR = 1.2 WU Ao sat = 94% PA sat = 65%, 65%    1. Mild non-obstructive CAD 2. NICM (likely hypertensive) with EF 35% by echo 3. Elevated filling pressures with normal CO 4. Severe systemic HTN  ROS: All systems negative except as listed in HPI, PMH and Problem List.  SH:  Social History   Socioeconomic History   Marital status: Married    Spouse name: Office Manager  Number of children: 3   Years of education: Not on file   Highest education level: Bachelor's degree (e.g., BA, AB, BS)  Occupational History   Occupation: [employer] Manager    Comment: [place]  Tobacco Use   Smoking status: Former    Types: Cigars   Smokeless tobacco: Never   Tobacco comments:    Former smoker 01/01/23  Vaping Use   Vaping status: Never Used  Substance and Sexual Activity   Alcohol use: Not Currently    Comment: stop drinking 01/01/23   Drug use: No   Sexual activity: Not on file  Other Topics Concern   Not on file  Social  History Narrative   Newly married, 2012, 1 daughter, 1 son   No injectable steroid cycles   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, 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)   Regular exercise-yes   Social Drivers of Health   Tobacco Use: Medium Risk (04/07/2024)   Patient History    Smoking Tobacco Use: Former    Smokeless Tobacco Use: Never    Passive Exposure: Not on Actuary Strain: Not on file  Food Insecurity: No Food Insecurity (02/11/2023)   Hunger Vital Sign    Worried About Running Out of Food in the Last Year: Never true    Ran Out of Food in the Last Year: Never true  Transportation Needs: No Transportation Needs (02/11/2023)   PRAPARE - Administrator, Civil Service (Medical): No    Lack of Transportation (Non-Medical): No  Physical Activity: Not on file  Stress: Not on file  Social Connections: Unknown (09/06/2021)   Received from [facility]   Social Network    Social Network: Not on file  Intimate Partner Violence: Not At Risk (02/11/2023)   Humiliation, Afraid, Rape, and Kick questionnaire    Fear of Current or Ex-Partner: No    Emotionally Abused: No    Physically Abused: No    Sexually Abused: No  Depression (PHQ2-9): Not on file  Alcohol Screen: Not on file  Housing: Low Risk (02/11/2023)   Housing    Last Housing Risk Score: 0  Utilities: Not At Risk (02/11/2023)   AHC Utilities    Threatened with loss of utilities: No  Health Literacy: Not on file   FH:  Family History  Problem Relation Age of Onset   Hypertension Mother    Hypertension Father    Diabetes Father    Diabetes Sister    Hypertension Brother    Kidney disease Paternal Grandmother        ESRD   Coronary artery disease Neg Hx    Stroke Neg Hx    Cancer Neg Hx    Past Medical History:  Diagnosis Date   CKD (chronic kidney disease) stage 3, GFR 30-59 ml/min (HCC)    baseline Cr 1.5-1.7   Decreased visual acuity    Left eye, resolved - hypertensive  retinopathy   Diabetes mellitus 2012   Type II   Hilar adenopathy    on CT scan 02/2010, on rpt scan stable/improved.   History of Bell's palsy 12/2007   history, Left   History of headache    HTN (hypertension), malignant    previously on BC and goody powders for HA   Internal derangement of knee 12/2007   Left   Microalbuminuria    Morbid obesity (HCC)    Stroke (HCC)    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HCC)    echo 2011 with  nonischemic hypertensive cardiomyopathy   Systolic murmur    Vitamin D  deficiency    Current Outpatient Medications  Medication Sig Dispense Refill   apixaban  (ELIQUIS ) 5 MG TABS tablet Take 1 tablet (5 mg total) by mouth 2 (two) times daily. 180 tablet 3   atorvastatin  (LIPITOR) 20 MG tablet Take 1 tablet (20 mg total) by mouth daily. 90 tablet 3   Continuous Glucose Sensor (FREESTYLE LIBRE 3 PLUS SENSOR) MISC Use to continuously monitor blood glucose, changing the sensor every 15 days, 6 each 3   diltiazem  (CARDIZEM  CD) 360 MG 24 hr capsule Take 1 capsule (360 mg total) by mouth daily. 90 capsule 2   empagliflozin  (JARDIANCE ) 10 MG TABS tablet Take 1 tablet (10 mg total) by mouth daily. 90 tablet 3   furosemide  (LASIX ) 20 MG tablet Take 1 tablet (20 mg total) by mouth as needed. 30 tablet 3   isosorbide -hydrALAZINE  (BIDIL ) 20-37.5 MG tablet Take 2 tablets by mouth 3 (three) times daily. 545 tablet 3   metoprolol  succinate (TOPROL -XL) 100 MG 24 hr tablet Take 1 tablet (100 mg total) by mouth at bedtime. Take with or immediately following a meal. 90 tablet 3   sacubitril -valsartan  (ENTRESTO ) 49-51 MG Take 1 tablet by mouth 2 (two) times daily. 60 tablet 11   spironolactone  (ALDACTONE ) 25 MG tablet Take 0.5 tablets (12.5 mg total) by mouth daily. 90 tablet 3   tirzepatide  (MOUNJARO ) 12.5 MG/0.5ML Pen Inject 12.5 mg into the skin once a week. 9 mL 1   No current facility-administered medications for this encounter.   BP (!) 162/100   Pulse 99   Ht 6' 8 (2.032 m)   Wt (!)  174.9 kg (385 lb 9.6 oz)   SpO2 98%   BMI 42.36 kg/m   Wt Readings from Last 3 Encounters:  04/07/24 (!) 174.9 kg (385 lb 9.6 oz)  03/21/24 (!) 169.6 kg (374 lb)  10/19/23 (!) 166.9 kg (368 lb)   PHYSICAL EXAM: Vitals:   04/07/24 1438  BP: (!) 162/100  Pulse: 99  SpO2: 98%   GENERAL: well appearing, NAD Lungs- clear CARDIAC:  JVP not elevated         Irregularly irregular rate and rhythm. No MRG. Trace Rt LEE (chronic)  ABDOMEN: Soft, non-tender, non-distended.  EXTREMITIES: Warm and well perfused.  NEUROLOGIC: No obvious FND   ASSESSMENT & PLAN: 1. Chronic Heart Failure with improved EF - NICM. LHC w/ mild nonobstructive CAD. Suspect HTN cardiomyopathy.  - Echo 01/31/21 EF 30-35% with severe LVH. Doubt amyloid with uncontrolled hypertension however if EF drops again will need cMRI if he fits - Echo 3/23 EF 55-60% (recovered EF) - Echo 12/23 EF 55-60% - TEE 6/24: EF 25-30% in setting of AF with RVR - Afib/flutter ablation 6/24 - Echo 9/24: EF 50-55%, severe LVH, RV mildly reduced - NYHA I-II. Euvolemic on exam. R LEE is chronic  - Continue Spironolactone  12.5 mg daily  - Continue Entresto  49/51 mg bid. - Continue Toprol  XL 100 mg daily. - Continue Jardiance  10 mg daily.  - Continue Bidil  2 tab tid - Continue Lasix  20 mg PRN  - Check BMP and BNP today    2. PAF/AFL - S/P TEE/DC-CV (10/22) with restoration NSR  - Recurrent AF 3/23 s/p DCCV - Back in AF after amio stopped in 8/23 due to hyperthyroidism . - Afib/flutter ablation 6/24. - s/p AF ablation 3/25 (Camnitz) - back in AFL on EKG today, 110 bpm - will arrange for outpatient DCCV (  no missed doses of Eliquis  in last 30 days)   - Continue Eliquis  5 mg bid  - Will refer to Afib clinic for evaluation for other potential AADs to help keep in NSR. No amio w/ prior AI-hyperthyroidism, severe LVH on echo may prohibit use of Flecainide. ? Potential use of sotalol  - Needs treatment of OSA. Pending home sleep study      3. Hypertension  - mild-moderately elevated, hasn't taken Bidil  yet - continue current regimen. Will not dose adjust given CKD, avoid hypotension    4. CKD Stage IIIb  - Baseline SCr 1.7-2.0 - follows w/ [facility] - Continue SGLT2i - Follow renal function closely with addition of spiro. Repeat BMP today    5. DMII - Last A1c 9.0  - Continue Jardiance  and Mounjaro  - Follows with Endocrinology.    6. OSA - Needs setting adjusted, not wearing - He has follow up with Dr. Shlomo 11/2023, pending repeat sleep study    7. CAD - Mild nonobstructive on cath - denies CP  - Continue atorvastatin  20 mg daily - No ASA w/ Eliquis .  - Last LDL good at 48   8. S/p R great toe amputation - s/p R LE Osteomyelitis/R lower leg cellulitis, required Wound VAC 5/23. - 2 admissions with R lower extremity cellulitis/sepsis.  - Resolved.  9. Obesity - Body mass index is 42.36 kg/m. - Now on Mounjaro    10. Hyperthyroidism - felt to be due to amio=>amio stopped - Methimazole  and prednisone  started 12/23, now off - Sees Dr. Vianne  F/u: w/ Dr. Cherrie in 3-4 months    Caffie Shed, PA-C  3:44 PM  Patient seen and examined with the above-signed Advanced Practice Provider and/or Housestaff. I personally reviewed laboratory data, imaging studies and relevant notes. I independently examined the patient and formulated the important aspects of the plan. I have edited the note to reflect any of my changes or salient points. I have personally discussed the plan with the patient and/or family.  Had AF ablation 3/25. He is back in AF. Feels pretty good but rates mildly elevated. Recent EF improved with control of AF. EF 50-55% (Personally reviewed)  General:  Sitting up. No resp difficulty HEENT: normal Neck: supple. no JVD.  Cor: Irregular rate & rhythm. No rubs, gallops or murmurs. Lungs: clear Abdomen: obese soft, nontender, nondistended.Good bowel sounds. Extremities: no cyanosis, clubbing,  rash, edema Neuro: alert & orientedx3, cranial nerves grossly intact. moves all 4 extremities w/o difficulty. Affect pleasant  He is back in AF. EF has improved with maintenance of sinus. AAD options limited. Will plan DC-CV and refer back to AF Clinic/Dr. Camnitz to re-evaluate. Needs treatment of OSA.  Toribio Cherrie, MD  8:22 AM

## 2024-04-07 NOTE — H&P (View-Only) (Signed)
 Advanced Heart Failure Clinic Note  PCP: [facility]  Cardiologist: Dr. Cherrie  HPI: Mr Reap is a 53 year old with a history of HFrEF, paroxsymal A fib/atrial flutter s/p ablation with PVI w/ box lesion, HTN, hypertensive retinopathy, CKD Stage IIIa, hyperlipidemia, DMII, Bells palsy, CVA, and OSA.    Admitted in 12/02/2019 with newly diagnosed A fib + ischemic CVA. He did not meet criteria for IV tPA. MRI showed large acute focal infarct, left frontal lobe predominantly ACA distribution. CT angiogram of the head and neck performed revealing no significant carotid, vertebral or intracranial stenosis. Echocardiogram obtained with bubble study showing no cardiac etiology for stroke. LVEF was 40 to 45%.   Admitted 10/22 with A/C HFrEF and A fib RVR.  Echo showed EF down to 30-35% with speckled appearance concerning for amyloid. Once diuresed and on IV amio, had TEE-DC-CV with restoration of NSR. RHC/LHC with min obs CAD, elevated filling pressures and normal cardiac output.  Diuresed 40 pounds. Discharge weight 367 pound.  Echo 3/23 EF 55% severe LVH.  Admitted 5/23 with osteomyelitis R great toe and cellulitis RLE. S/p R first ray amputation + placement wound VAC. Wound later dehisced and underwent surgical debridement 6/23.   Amiodarone  stopped in 8/23 due to hyperthyroidism. Started on methimazole  and prednisone .    He underwent Afib/flutter ablation with PVI/box lesion ablation in 06/24. TEE at that time with EF 25-30% in setting of AF with RVR.  In AFL with RVR 8/24. He was seen in ED and EP consulted. He was off metoprolol . Toprol  Xl restarted.   Echo 01/15/23: EF 50-55%, severe LVH, RV mildly reduced, severe LAE  Admitted 9/24 with sepsis secondary to RLE cellulitis. He remained in Afib/flutter with RVR. Seen by EP, beta blocker increased and cardizem  added. Plan for repeat ablation as outpatient. Unfortunately, he was admitted again in 10/24 d/t RLE cellulitis with abscess.  He was treated with IV abx and did not require surgical intervention.  S/p AF ablation 3/25.  EP f/u 6/25 he was in NSR  11/25 Seen in Sleep clinic to discuss OSA treatment and was noted to be back in AFL low 100s.   Today he returns for HF follow up. He has had nearly 20 lb wt gain over the last several months but denies any significant change in dyspnea. No resting dyspnea. Reports stable NYHA class II symptoms. No LEE. He thinks wt gain is caloric.   BP elevated, 162/100. Took AM meds but has yet to take mid day dose of Bidil .   EKG shows persistent AFL 110 bpm. Asymptomatic     Cardiac Testing - Echo (9/24): EF 50-55%  - Echo (3/23): EF 55%, severe LVH  - Echo (10/22): EF 30-35% specked appearance concerning for amyloid. RV normal. LA severely dilated. Severe LVH.   - Echo (8/21): EF 40-45% and severe LVH   - R/LHC (10/22)  Ao = 163/108 (134) LV = 136/25 RA =  15 RV = 51/13 PA = 57/19 (39) PCW = 28 Fick cardiac output/index = 9.2/3.0 PVR = 1.2 WU Ao sat = 94% PA sat = 65%, 65%    1. Mild non-obstructive CAD 2. NICM (likely hypertensive) with EF 35% by echo 3. Elevated filling pressures with normal CO 4. Severe systemic HTN  ROS: All systems negative except as listed in HPI, PMH and Problem List.  SH:  Social History   Socioeconomic History   Marital status: Married    Spouse name: Office Manager  Number of children: 3   Years of education: Not on file   Highest education level: Bachelor's degree (e.g., BA, AB, BS)  Occupational History   Occupation: [employer] Manager    Comment: [place]  Tobacco Use   Smoking status: Former    Types: Cigars   Smokeless tobacco: Never   Tobacco comments:    Former smoker 01/01/23  Vaping Use   Vaping status: Never Used  Substance and Sexual Activity   Alcohol use: Not Currently    Comment: stop drinking 01/01/23   Drug use: No   Sexual activity: Not on file  Other Topics Concern   Not on file  Social  History Narrative   Newly married, 2012, 1 daughter, 1 son   No injectable steroid cycles   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, 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)   Regular exercise-yes   Social Drivers of Health   Tobacco Use: Medium Risk (04/07/2024)   Patient History    Smoking Tobacco Use: Former    Smokeless Tobacco Use: Never    Passive Exposure: Not on Actuary Strain: Not on file  Food Insecurity: No Food Insecurity (02/11/2023)   Hunger Vital Sign    Worried About Running Out of Food in the Last Year: Never true    Ran Out of Food in the Last Year: Never true  Transportation Needs: No Transportation Needs (02/11/2023)   PRAPARE - Administrator, Civil Service (Medical): No    Lack of Transportation (Non-Medical): No  Physical Activity: Not on file  Stress: Not on file  Social Connections: Unknown (09/06/2021)   Received from [facility]   Social Network    Social Network: Not on file  Intimate Partner Violence: Not At Risk (02/11/2023)   Humiliation, Afraid, Rape, and Kick questionnaire    Fear of Current or Ex-Partner: No    Emotionally Abused: No    Physically Abused: No    Sexually Abused: No  Depression (PHQ2-9): Not on file  Alcohol Screen: Not on file  Housing: Low Risk (02/11/2023)   Housing    Last Housing Risk Score: 0  Utilities: Not At Risk (02/11/2023)   AHC Utilities    Threatened with loss of utilities: No  Health Literacy: Not on file   FH:  Family History  Problem Relation Age of Onset   Hypertension Mother    Hypertension Father    Diabetes Father    Diabetes Sister    Hypertension Brother    Kidney disease Paternal Grandmother        ESRD   Coronary artery disease Neg Hx    Stroke Neg Hx    Cancer Neg Hx    Past Medical History:  Diagnosis Date   CKD (chronic kidney disease) stage 3, GFR 30-59 ml/min (HCC)    baseline Cr 1.5-1.7   Decreased visual acuity    Left eye, resolved - hypertensive  retinopathy   Diabetes mellitus 2012   Type II   Hilar adenopathy    on CT scan 02/2010, on rpt scan stable/improved.   History of Bell's palsy 12/2007   history, Left   History of headache    HTN (hypertension), malignant    previously on BC and goody powders for HA   Internal derangement of knee 12/2007   Left   Microalbuminuria    Morbid obesity (HCC)    Stroke (HCC)    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HCC)    echo 2011 with  nonischemic hypertensive cardiomyopathy   Systolic murmur    Vitamin D  deficiency    Current Outpatient Medications  Medication Sig Dispense Refill   apixaban  (ELIQUIS ) 5 MG TABS tablet Take 1 tablet (5 mg total) by mouth 2 (two) times daily. 180 tablet 3   atorvastatin  (LIPITOR) 20 MG tablet Take 1 tablet (20 mg total) by mouth daily. 90 tablet 3   Continuous Glucose Sensor (FREESTYLE LIBRE 3 PLUS SENSOR) MISC Use to continuously monitor blood glucose, changing the sensor every 15 days, 6 each 3   diltiazem  (CARDIZEM  CD) 360 MG 24 hr capsule Take 1 capsule (360 mg total) by mouth daily. 90 capsule 2   empagliflozin  (JARDIANCE ) 10 MG TABS tablet Take 1 tablet (10 mg total) by mouth daily. 90 tablet 3   furosemide  (LASIX ) 20 MG tablet Take 1 tablet (20 mg total) by mouth as needed. 30 tablet 3   isosorbide -hydrALAZINE  (BIDIL ) 20-37.5 MG tablet Take 2 tablets by mouth 3 (three) times daily. 545 tablet 3   metoprolol  succinate (TOPROL -XL) 100 MG 24 hr tablet Take 1 tablet (100 mg total) by mouth at bedtime. Take with or immediately following a meal. 90 tablet 3   sacubitril -valsartan  (ENTRESTO ) 49-51 MG Take 1 tablet by mouth 2 (two) times daily. 60 tablet 11   spironolactone  (ALDACTONE ) 25 MG tablet Take 0.5 tablets (12.5 mg total) by mouth daily. 90 tablet 3   tirzepatide  (MOUNJARO ) 12.5 MG/0.5ML Pen Inject 12.5 mg into the skin once a week. 9 mL 1   No current facility-administered medications for this encounter.   BP (!) 162/100   Pulse 99   Ht 6' 8 (2.032 m)   Wt (!)  174.9 kg (385 lb 9.6 oz)   SpO2 98%   BMI 42.36 kg/m   Wt Readings from Last 3 Encounters:  04/07/24 (!) 174.9 kg (385 lb 9.6 oz)  03/21/24 (!) 169.6 kg (374 lb)  10/19/23 (!) 166.9 kg (368 lb)   PHYSICAL EXAM: Vitals:   04/07/24 1438  BP: (!) 162/100  Pulse: 99  SpO2: 98%   GENERAL: well appearing, NAD Lungs- clear CARDIAC:  JVP not elevated         Irregularly irregular rate and rhythm. No MRG. Trace Rt LEE (chronic)  ABDOMEN: Soft, non-tender, non-distended.  EXTREMITIES: Warm and well perfused.  NEUROLOGIC: No obvious FND   ASSESSMENT & PLAN: 1. Chronic Heart Failure with improved EF - NICM. LHC w/ mild nonobstructive CAD. Suspect HTN cardiomyopathy.  - Echo 01/31/21 EF 30-35% with severe LVH. Doubt amyloid with uncontrolled hypertension however if EF drops again will need cMRI if he fits - Echo 3/23 EF 55-60% (recovered EF) - Echo 12/23 EF 55-60% - TEE 6/24: EF 25-30% in setting of AF with RVR - Afib/flutter ablation 6/24 - Echo 9/24: EF 50-55%, severe LVH, RV mildly reduced - NYHA I-II. Euvolemic on exam. R LEE is chronic  - Continue Spironolactone  12.5 mg daily  - Continue Entresto  49/51 mg bid. - Continue Toprol  XL 100 mg daily. - Continue Jardiance  10 mg daily.  - Continue Bidil  2 tab tid - Continue Lasix  20 mg PRN  - Check BMP and BNP today    2. PAF/AFL - S/P TEE/DC-CV (10/22) with restoration NSR  - Recurrent AF 3/23 s/p DCCV - Back in AF after amio stopped in 8/23 due to hyperthyroidism . - Afib/flutter ablation 6/24. - s/p AF ablation 3/25 (Camnitz) - back in AFL on EKG today, 110 bpm - will arrange for outpatient DCCV (  no missed doses of Eliquis  in last 30 days)   - Continue Eliquis  5 mg bid  - Will refer to Afib clinic for evaluation for other potential AADs to help keep in NSR. No amio w/ prior AI-hyperthyroidism, severe LVH on echo may prohibit use of Flecainide. ? Potential use of sotalol  - Needs treatment of OSA. Pending home sleep study      3. Hypertension  - mild-moderately elevated, hasn't taken Bidil  yet - continue current regimen. Will not dose adjust given CKD, avoid hypotension    4. CKD Stage IIIb  - Baseline SCr 1.7-2.0 - follows w/ [facility] - Continue SGLT2i - Follow renal function closely with addition of spiro. Repeat BMP today    5. DMII - Last A1c 9.0  - Continue Jardiance  and Mounjaro  - Follows with Endocrinology.    6. OSA - Needs setting adjusted, not wearing - He has follow up with Dr. Shlomo 11/2023, pending repeat sleep study    7. CAD - Mild nonobstructive on cath - denies CP  - Continue atorvastatin  20 mg daily - No ASA w/ Eliquis .  - Last LDL good at 48   8. S/p R great toe amputation - s/p R LE Osteomyelitis/R lower leg cellulitis, required Wound VAC 5/23. - 2 admissions with R lower extremity cellulitis/sepsis.  - Resolved.  9. Obesity - Body mass index is 42.36 kg/m. - Now on Mounjaro    10. Hyperthyroidism - felt to be due to amio=>amio stopped - Methimazole  and prednisone  started 12/23, now off - Sees Dr. Vianne  F/u: w/ Dr. Cherrie in 3-4 months    Caffie Shed, PA-C  3:44 PM  Patient seen and examined with the above-signed Advanced Practice Provider and/or Housestaff. I personally reviewed laboratory data, imaging studies and relevant notes. I independently examined the patient and formulated the important aspects of the plan. I have edited the note to reflect any of my changes or salient points. I have personally discussed the plan with the patient and/or family.  Had AF ablation 3/25. He is back in AF. Feels pretty good but rates mildly elevated. Recent EF improved with control of AF. EF 50-55% (Personally reviewed)  General:  Sitting up. No resp difficulty HEENT: normal Neck: supple. no JVD.  Cor: Irregular rate & rhythm. No rubs, gallops or murmurs. Lungs: clear Abdomen: obese soft, nontender, nondistended.Good bowel sounds. Extremities: no cyanosis, clubbing,  rash, edema Neuro: alert & orientedx3, cranial nerves grossly intact. moves all 4 extremities w/o difficulty. Affect pleasant  He is back in AF. EF has improved with maintenance of sinus. AAD options limited. Will plan DC-CV and refer back to AF Clinic/Dr. Camnitz to re-evaluate. Needs treatment of OSA.  Toribio Cherrie, MD  8:22 AM

## 2024-04-07 NOTE — Progress Notes (Signed)
 Height:     Weight: BMI:  Today's Date:  STOP BANG RISK ASSESSMENT S (snore) Have you been told that you snore?     YES   T (tired) Are you often tired, fatigued, or sleepy during the day?   YES  O (obstruction) Do you stop breathing, choke, or gasp during sleep? YES   P (pressure) Do you have or are you being treated for high blood pressure? YES   B (BMI) Is your body index greater than 35 kg/m? YES   A (age) Are you 51 years old or older? YES   N (neck) Do you have a neck circumference greater than 16 inches?   NO   G (gender) Are you a male? YES   TOTAL STOP/BANG YES ANSWERS 7                                                                       For Office Use Only              Procedure Order Form    YES to 3+ Stop Bang questions OR two clinical symptoms - patient qualifies for WatchPAT (CPT 95800)      Clinical Notes: Will consult Sleep Specialist and refer for management of therapy due to patient increased risk of Sleep Apnea. Ordering a sleep study due to the following two clinical symptoms: Excessive daytime sleepiness G47.10 / Gastroesophageal reflux K21.9 / Nocturia R35.1 / Morning Headaches G44.221 / Difficulty concentrating R41.840 / Memory problems or poor judgment G31.84 / Personality changes or irritability R45.4 / Loud snoring R06.83 / Depression F32.9 / Unrefreshed by sleep G47.8 / Impotence N52.9 / History of high blood pressure R03.0 / Insomnia G47.00

## 2024-04-07 NOTE — Progress Notes (Signed)
 DCCV scheduled for 12/18 with Dr. Bensimhon

## 2024-04-07 NOTE — Progress Notes (Signed)
 ITAMAR home sleep study given to patient, all instructions explained, waiver signed, and CLOUDPAT registration complete.

## 2024-04-07 NOTE — Patient Instructions (Addendum)
 Medication Changes:  No Changes In Medications at this time.   Lab Work:  Labs done today, your results will be available in MyChart, we will contact you for abnormal readings.  Testing/Procedures:  Your provider has recommended that you have a home sleep study (Itamar Test).  We have provided you with the equipment in our office today. Please go ahead and download the app. DO NOT OPEN OR TAMPER WITH THE BOX UNTIL WE ADVISE YOU TO DO SO. Once insurance has approved the test our office will call you with PIN number and approval to proceed with testing. Once you have completed the test you just dispose of the equipment, the information is automatically uploaded to us  via blue-tooth technology. If your test is positive for sleep apnea and you need a home CPAP machine you will be contacted by Dr Dorine office [facility]) to set this up.  YOU CAN GO AHEAD AND DO ITAMAR SLEEP STUDY---NO PRE CERT NEEDED    Special Instructions // Education:     Dear Randy [name]   You are scheduled for a Cardioversion on Thursday, December 18 with Dr. CHERRIE.  Please arrive at the [facility] (Main Entrance A) at [facility]: [address] [place], [place] [address] at 6:30 AM (This time is 1 hour(s) before your procedure to ensure your preparation).   Free valet parking service is available. You will check in at ADMITTING.   *Please Note: You will receive a call the day before your procedure to confirm the appointment time. That time may have changed from the original time based on the schedule for that day.*   DIET:  Nothing to eat or drink after midnight except a sip of water with medications (see medication instructions below)  MEDICATION INSTRUCTIONS: !!IF ANY NEW MEDICATIONS ARE STARTED AFTER TODAY, PLEASE NOTIFY YOUR PROVIDER AS SOON AS POSSIBLE!!  FYI: Medications such as Semaglutide (Ozempic, Saxon), Tirzepatide  (Mounjaro , Zepbound ), Dulaglutide (Trulicity), etc (GLP1  agonists) AND Canagliflozin (Invokana), Dapagliflozin (Farxiga), Empagliflozin  (Jardiance ), Ertugliflozin (Steglatro), Bexagliflozin Arboriculturist) or any combination with one of these drugs such as Invokamet (Canagliflozin/Metformin ), Synjardy (Empagliflozin /Metformin ), etc (SGLT2 inhibitors) must be held around the time of a procedure. This is not a comprehensive list of all of these drugs. Please review all of your medications and talk to your provider if you take any one     HOLD: Tirzepatide  (Mounjaro , Zepbound ) for 7 days prior to the procedure. Last dose on Wednesday, December 10.  HOLD: Empagliflozin  (Jardiance ) for 3 days prior to the procedure. Last dose on Sunday, December 14.   Continue taking your anticoagulant (blood thinner): Apixaban  (Eliquis ).  You will need to continue this after your procedure until you are told by your provider that it is safe to stop.    LABS: TODAY   FYI:  For your safety, and to allow us  to monitor your vital signs accurately during the surgery/procedure we request: If you have artificial nails, gel coating, SNS etc, please have those removed prior to your surgery/procedure. Not having the nail coverings /polish removed may result in cancellation or delay of your surgery/procedure.  Your support person will be asked to wait in the waiting room during your procedure.  It is OK to have someone drop you off and come back when you are ready to be discharged.  You cannot drive after the procedure and will need someone to drive you home.  Bring your insurance cards.  *Special Note: Every effort is made to have your procedure  done on time. Occasionally there are emergencies that occur at the hospital that may cause delays. Please be patient if a delay does occur.   YOU HAVE BEEN REFERRED TO [facility] CLINIC  THEY WILL REACH OUT TO YOU OR CALL TO ARRANGE THIS. PLEASE CALL US  WITH ANY CONCERNS     Follow-Up in: 3-4 MONTHS WITH DR. CHERRIE PLEASE CALL OUR OFFICE  AROUND FEBRUARY TO GET SCHEDULED FOR YOUR APPOINTMENT. PHONE NUMBER IS [phone] OPTION 2   At the [facility], you and your health needs are our priority. We have a designated team specialized in the treatment of Heart Failure. This Care Team includes your primary Heart Failure Specialized Cardiologist (physician), Advanced Practice Providers (APPs- Physician Assistants and Nurse Practitioners), and Pharmacist who all work together to provide you with the care you need, when you need it.   You may see any of the following providers on your designated Care Team at your next follow up:  Dr. Toribio Cherrie Dr. Ezra Shuck Dr. Odis Brownie Greig Mosses, NP Caffie Shed, [place] [facility] [place], [place] Beckey Coe, NP Jordan Lee, NP Tinnie Redman, PharmD   Please be sure to bring in all your medications bottles to every appointment.   Need to Contact Us :  If you have any questions or concerns before your next appointment please send us  a message through [place] or call our office at [phone].    TO LEAVE A MESSAGE FOR THE NURSE SELECT OPTION 2, PLEASE LEAVE A MESSAGE INCLUDING: YOUR NAME DATE OF BIRTH CALL BACK NUMBER REASON FOR CALL**this is important as we prioritize the call backs  YOU WILL RECEIVE A CALL BACK THE SAME DAY AS LONG AS YOU CALL BEFORE 4:00 PM

## 2024-04-10 ENCOUNTER — Ambulatory Visit (HOSPITAL_COMMUNITY): Payer: Self-pay | Admitting: Cardiology

## 2024-04-10 ENCOUNTER — Encounter: Payer: Self-pay | Admitting: Cardiology

## 2024-04-10 DIAGNOSIS — R0683 Snoring: Secondary | ICD-10-CM | POA: Diagnosis not present

## 2024-04-12 ENCOUNTER — Other Ambulatory Visit (HOSPITAL_COMMUNITY): Payer: Self-pay | Admitting: Physician Assistant

## 2024-04-12 ENCOUNTER — Encounter: Payer: Self-pay | Admitting: Internal Medicine

## 2024-04-12 ENCOUNTER — Other Ambulatory Visit (HOSPITAL_COMMUNITY): Payer: Self-pay

## 2024-04-12 ENCOUNTER — Other Ambulatory Visit: Payer: Self-pay

## 2024-04-12 ENCOUNTER — Ambulatory Visit: Admitting: Internal Medicine

## 2024-04-12 MED ORDER — ISOSORB DINITRATE-HYDRALAZINE 20-37.5 MG PO TABS
2.0000 | ORAL_TABLET | Freq: Three times a day (TID) | ORAL | 3 refills | Status: AC
  Filled 2024-04-12: qty 545, 91d supply, fill #0
  Filled 2024-05-16: qty 180, 30d supply, fill #0

## 2024-04-12 MED ORDER — FUROSEMIDE 20 MG PO TABS
20.0000 mg | ORAL_TABLET | ORAL | 3 refills | Status: AC | PRN
  Filled 2024-04-12 – 2024-05-08 (×4): qty 30, 30d supply, fill #0
  Filled 2024-05-16 – 2024-05-31 (×3): qty 30, 30d supply, fill #1

## 2024-04-12 MED ORDER — APIXABAN 5 MG PO TABS
5.0000 mg | ORAL_TABLET | Freq: Two times a day (BID) | ORAL | 3 refills | Status: AC
  Filled 2024-04-12: qty 60, 30d supply, fill #0
  Filled 2024-04-15: qty 180, 90d supply, fill #0
  Filled 2024-05-10 – 2024-05-29 (×3): qty 60, 30d supply, fill #1
  Filled 2024-05-30: qty 60, 30d supply, fill #0

## 2024-04-12 NOTE — Progress Notes (Deleted)
 Patient ID: Randy [name], male   DOB: 17-Jul-1970, 53 y.o.   MRN: [id number] This note was precharted 04/03/2024 and 04/07/2023.  HPI: Randy [name] is a 53 y.o.-year-old male, initially referred by his cardiologist, Dr. 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  Last visit 6 months ago.  He missed the last 2 appointments.  Interim history: No increased urination, blurry vision, nausea, chest pain.  DM2: Reviewed his HbA1c levels: Lab Results  Component Value Date   HGBA1C 5.7 (A) 10/01/2023   HGBA1C 5.4 05/13/2023   HGBA1C 6.5 (H) 02/11/2023   HGBA1C 5.8 (A) 12/22/2022   HGBA1C 6.4 (A) 08/12/2022   HGBA1C 5.9 (A) 05/06/2022   HGBA1C 10.1 (H) 09/14/2021   HGBA1C 6.3 (H) 01/27/2021   HGBA1C 7.4 (H) 08/08/2020   HGBA1C 12.5 (H) 11/28/2014   He is on a regimen of: - Jardiance  10 mg before breakfast - Mounjaro  15 mg weekly - started 11/2021 >> 12.5 mg weekly Metformin  stopped 2/2 CKD, AP. He has not been on insulin .  Pt checks his sugars 0-1x a day: - am: 109-120 >> 80, 85-90s >> 90s-105 - 2h after b'fast: n/c - before lunch: n/c - 2h after lunch: 110-120 >> n/c - before dinner: n/c - 2h after dinner: 160s,180 (cake) >> 140s-160 >> 160-200 - bedtime: n/c - nighttime: n/c Lowest sugar was 40 >> ... And 90; he has hypoglycemia awareness at 40.  Highest sugar was 180 >> 160 >> 200  Glucometer:One Touch  Pt's meals are: - Breakfast: steak biscuit  - Lunch: eating out - Dinner: skips, or veggies - Snacks: used to drink gatorade, not anymore  - + CKD-sees nephrology, last BUN/creatinine:  Lab Results  Component Value Date   BUN 26 (H) 04/07/2024   BUN 35 (H) 03/21/2024   CREATININE 2.14 (H) 04/07/2024   CREATININE 2.24 (H) 03/21/2024   Lab Results  Component Value Date    MICRALBCREAT 81.8 (H) 05/29/2011  He is on Entresto .  - + HL; last set of lipids: Lab Results  Component Value Date   CHOL 123 08/13/2023   HDL 66 08/13/2023   LDLCALC 48 08/13/2023   LDLDIRECT 79.0 11/28/2014   TRIG 46 08/13/2023   CHOLHDL 1.9 08/13/2023  He is on Lipitor 20 mg daily.  - last eye exam was in 11/2022. No DR reportedly. IOP is elevated, stable. [facility]  - no numbness and tingling in his feet. Last foot exam 10/01/2023 He had a R foot ulcer for which he was seeing orthopedics - healing.  However, he developed cellulitis and was also admitted for sepsis in 12/2022.  He mentions that he was in the hospital for a month.  This healed.  Pt has FH of DM - brother, sister, father.  Amiodarone -induced thyrotoxicosis:  Amiodarone  was stopped 11/2021 due to thyrotoxicosis.  04/15/2022: following regimen started by Dr. Bensimhon: - Methimazole  5 mg twice a day - Prednisone  20 mg daily  04/2022: We decreased the doses: - Methimazole  5 mg once a day - Prednisone  10 mg daily  07/2022:  - stopped MMI - Reduced prednisone  to 5 mg daily  09/2022: - Continued prednisone  5 mg daily as his fT4 was slightly elevated However, he stopped as he run out of the Rx 10/2022.  Reviewed TFTs: Lab Results  Component Value Date   TSH 1.12 10/01/2023  TSH 0.83 05/13/2023   TSH 1.08 12/22/2022   TSH 0.78 10/08/2022   TSH 0.88 08/12/2022   TSH 0.03 (L) 05/06/2022   TSH 0.010 (L) 04/22/2022   TSH <0.010 (L) 03/25/2022   TSH <0.010 (L) 02/03/2022   TSH 0.103 (L) 11/21/2021   TSH 0.90 09/05/2010   TSH 1.345 03/13/2010   Lab Results  Component Value Date   FREET4 1.6 10/01/2023   FREET4 1.8 05/13/2023   FREET4 1.39 12/22/2022   FREET4 1.72 (H) 10/08/2022   FREET4 1.51 08/12/2022   FREET4 1.83 (H) 05/06/2022   FREET4 2.08 (H) 04/22/2022   FREET4 2.89 (H) 03/25/2022   FREET4 2.50 (H) 02/03/2022   FREET4 2.20 (H) 12/05/2021   Lab Results  Component Value Date    T3FREE 2.8 10/01/2023   T3FREE 3.3 05/13/2023   T3FREE 3.4 12/22/2022   T3FREE 2.8 10/08/2022   T3FREE 3.1 08/12/2022   T3FREE 3.8 05/06/2022   T3FREE 2.3 04/22/2022   T3FREE 3.4 03/25/2022   T3FREE 2.0 02/03/2022   T3FREE 2.6 12/05/2021   Lab Results  Component Value Date   TSI <89 05/06/2022   No FH of thyroid  cancer. (Wife has Graves ds). No h/o radiation tx to head or neck. No recent contrast studies. No herbal supplements. No Biotin use.   CTA neck (07/28/2020): Chronic moderate diffuse enlargement of the thyroid  gland without significant heterogeneity or a discrete nodule and for which no imaging follow-up is recommended.  Pt denies: - feeling nodules in neck - hoarseness - dysphagia - choking  She also has a history of significant HTN, obesity- 450 lbs at his highest weight. He ha atrial fibrillation and had a cardiac ablation in 09/2022 and 06/2023.  ROS: + See HPI  Past Medical History:  Diagnosis Date   CKD (chronic kidney disease) stage 3, GFR 30-59 ml/min (HCC)    baseline Cr 1.5-1.7   Decreased visual acuity    Left eye, resolved - hypertensive retinopathy   Diabetes mellitus 2012   Type II   Hilar adenopathy    on CT scan 02/2010, on rpt scan stable/improved.   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   Systolic murmur    Vitamin D  deficiency    Past Surgical History:  Procedure Laterality Date   AMPUTATION Right 09/17/2021   Procedure: First ray amputation;  Surgeon: Harden Jerona GAILS, MD;  Location: [facility] OR;  Service: Orthopedics;  Laterality: Right;   APPLICATION OF WOUND VAC Right 10/10/2021   Procedure: APPLICATION OF WOUND VAC;  Surgeon: Harden Jerona GAILS,  MD;  Location: [facility] OR;  Service: Orthopedics;  Laterality: Right;   ATRIAL FIBRILLATION ABLATION N/A 10/02/2022   Procedure: ATRIAL FIBRILLATION ABLATION;  Surgeon: Inocencio Soyla Lunger, MD;  Location: [facility] INVASIVE CV LAB;  Service: Cardiovascular;  Laterality: N/A;   ATRIAL FIBRILLATION ABLATION N/A 07/08/2023   Procedure: ATRIAL FIBRILLATION ABLATION;  Surgeon: Inocencio Soyla Lunger, MD;  Location: [facility] INVASIVE CV LAB;  Service: Cardiovascular;  Laterality: N/A;   CARDIOVERSION N/A 01/31/2021   Procedure: CARDIOVERSION;  Surgeon: Cherrie Toribio SAUNDERS, MD;  Location: [facility] ENDOSCOPY;  Service: Cardiovascular;  Laterality: N/A;  CARDIOVERSION N/A 07/04/2021   Procedure: CARDIOVERSION;  Surgeon: Cherrie Toribio SAUNDERS, MD;  Location: [facility] ENDOSCOPY;  Service: Cardiovascular;  Laterality: N/A;   hospitalization  11/2008   malignant HTN, nl SPEP/UPEP, neg ANCA panel, nl C3/4, neg anti GBM Ab, neg Hep A/B/C, nl renal US , nl PTH, neg HIV   I & D EXTREMITY Right 10/10/2021   Procedure: RIGHT FOOT DEBRIDEMENT;  Surgeon: Harden Jerona GAILS, MD;  Location: [facility] OR;  Service: Orthopedics;  Laterality: Right;   RIGHT/LEFT HEART CATH AND CORONARY ANGIOGRAPHY N/A 01/29/2021   Procedure: RIGHT/LEFT HEART CATH AND CORONARY ANGIOGRAPHY;  Surgeon: Cherrie Toribio SAUNDERS, MD;  Location: [facility] INVASIVE CV LAB;  Service: Cardiovascular;  Laterality: N/A;   TEE WITHOUT CARDIOVERSION N/A 01/31/2021   Procedure: TRANSESOPHAGEAL ECHOCARDIOGRAM (TEE);  Surgeon: Cherrie Toribio SAUNDERS, MD;  Location: [facility] ENDOSCOPY;  Service: Cardiovascular;  Laterality: N/A;   TEE WITHOUT CARDIOVERSION N/A 10/02/2022   Procedure: TRANSESOPHAGEAL ECHOCARDIOGRAM;  Surgeon: Inocencio Soyla Lunger, MD;  Location: [facility] INVASIVE CV LAB;  Service: Cardiovascular;  Laterality: N/A;   US  ECHOCARDIOGRAPHY  11/2008   LVsys fxn EF 50%, mild MR, normal LV size, neg ANA, neg cryoglobulins   US  ECHOCARDIOGRAPHY  2011   severe LVH, EF 40%, LA mildly dilated, PA pressure moderately increased    Social History   Socioeconomic History   Marital status: Married    Spouse name: Wiliam Cauthorn   Number of children: 3   Years of education: Not on file   Highest education level: Bachelor's degree (e.g., BA, AB, BS)  Occupational History   Occupation: [employer] Manager    Comment: [place]  Tobacco Use   Smoking status: Former    Types: Cigars   Smokeless tobacco: Never   Tobacco comments:    Former smoker 01/01/23  Vaping Use   Vaping status: Never Used  Substance and Sexual Activity   Alcohol use: Not Currently    Comment: stop drinking 01/01/23   Drug use: No   Sexual activity: Not on file  Other Topics Concern   Not on file  Social History Narrative   Newly married, 2012, 1 daughter, 1 son   No injectable steroid cycles   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, 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)   Regular exercise-yes   Social Drivers of Health   Tobacco Use: Medium Risk (04/07/2024)   Patient History    Smoking Tobacco Use: Former    Smokeless Tobacco Use: Never    Passive Exposure: Not on Actuary Strain: Not on file  Food Insecurity: No Food Insecurity (02/11/2023)   Hunger Vital Sign    Worried About Running Out of Food in the Last Year: Never true    Ran Out of Food in the Last Year: Never true  Transportation Needs: No Transportation Needs (02/11/2023)   PRAPARE - Administrator, Civil Service (Medical): No    Lack of Transportation (Non-Medical): No  Physical Activity: Not on file  Stress: Not on file  Social Connections: Unknown (09/06/2021)   Received from [facility]   Social Network    Social Network: Not on file  Intimate Partner Violence: Not At Risk (02/11/2023)   Humiliation, Afraid, Rape, and Kick questionnaire    Fear of Current or Ex-Partner: No    Emotionally Abused: No    Physically Abused: No    Sexually Abused: No  Depression (PHQ2-9): Not on file  Alcohol Screen: Not on file   Housing: Low Risk (  02/11/2023)   Housing    Last Housing Risk Score: 0  Utilities: Not At Risk (02/11/2023)   AHC Utilities    Threatened with loss of utilities: No  Health Literacy: Not on file   Current Outpatient Medications on File Prior to Visit  Medication Sig Dispense Refill   apixaban  (ELIQUIS ) 5 MG TABS tablet Take 1 tablet (5 mg total) by mouth 2 (two) times daily. 180 tablet 3   atorvastatin  (LIPITOR) 20 MG tablet Take 1 tablet (20 mg total) by mouth daily. 90 tablet 3   Continuous Glucose Sensor (FREESTYLE LIBRE 3 PLUS SENSOR) MISC Use to continuously monitor blood glucose, changing the sensor every 15 days, 6 each 3   diltiazem  (CARDIZEM  CD) 360 MG 24 hr capsule Take 1 capsule (360 mg total) by mouth daily. 90 capsule 2   empagliflozin  (JARDIANCE ) 10 MG TABS tablet Take 1 tablet (10 mg total) by mouth daily. 90 tablet 3   furosemide  (LASIX ) 20 MG tablet Take 1 tablet (20 mg total) by mouth as needed. 30 tablet 3   isosorbide -hydrALAZINE  (BIDIL ) 20-37.5 MG tablet Take 2 tablets by mouth 3 (three) times daily. 545 tablet 3   metoprolol  succinate (TOPROL -XL) 100 MG 24 hr tablet Take 1 tablet (100 mg total) by mouth at bedtime. Take with or immediately following a meal. 90 tablet 3   sacubitril -valsartan  (ENTRESTO ) 49-51 MG Take 1 tablet by mouth 2 (two) times daily. 60 tablet 11   spironolactone  (ALDACTONE ) 25 MG tablet Take 0.5 tablets (12.5 mg total) by mouth daily. 90 tablet 3   tirzepatide  (MOUNJARO ) 12.5 MG/0.5ML Pen Inject 12.5 mg into the skin once a week. 9 mL 1   No current facility-administered medications on file prior to visit.   Allergies  Allergen Reactions   Fish Protein-Containing Drug Products Anaphylaxis   Shellfish Allergy Anaphylaxis   Family History  Problem Relation Age of Onset   Hypertension Mother    Hypertension Father    Diabetes Father    Diabetes Sister    Hypertension Brother    Kidney disease Paternal Grandmother        ESRD   Coronary  artery disease Neg Hx    Stroke Neg Hx    Cancer Neg Hx    PE: There were no vitals taken for this visit. Wt Readings from Last 10 Encounters:  04/07/24 (!) 385 lb 9.6 oz (174.9 kg)  03/21/24 (!) 374 lb (169.6 kg)  10/19/23 (!) 368 lb (166.9 kg)  10/01/23 (!) 366 lb 12.8 oz (166.4 kg)  08/06/23 (!) 374 lb 9.6 oz (169.9 kg)  08/05/23 (!) 368 lb 12.8 oz (167.3 kg)  07/16/23 (!) 362 lb 9.6 oz (164.5 kg)  07/08/23 (!) 370 lb (167.8 kg)  05/17/23 (!) 364 lb 6.4 oz (165.3 kg)  05/13/23 (!) 371 lb (168.3 kg)   Constitutional: overweight, in NAD Eyes:  no exophthalmos ENT: no neck masses, no cervical lymphadenopathy Cardiovascular: RRR, No MRG, + significant bilateral leg swelling (R>L) Respiratory: CTA B Musculoskeletal: no deformities except absent right hallux Skin:no rashes Neurological: no tremor with outstretched hands  ASSESSMENT: 1. DM2, non-insulin -dependent, uncontrolled, with complications - CHF - A-fib with RVR - h/o CVA, CKD - osteomyelitis of the right foot, history of hallux amputation  2.  Amiodarone -induced thyrotoxicosis  PLAN:  1. Patient with longstanding, type 2 diabetes, on oral antidiabetic regimen with SGLT2 inhibitor and also weekly GLP-1/GIP receptor agonist, with improving control.  At last visit, HbA1c was 5.7%, slightly higher, but still  excellent.  Sugars were mostly at goal, with occasional higher values after dinner.  I did not suggest a change in regimen but we discussed about possibly using the CGM and he appears to be interested in this.  He was under the impression that the device can monitor his location and this was the only reason why he declined this in the past.  I advised him that this was not the case and he agreed to try a CGM. CGM interpretation: -At today's visit, we reviewed his CGM downloads: It appears that *** of values are in target range (goal >70%), while *** are higher than 180 (goal <25%), and *** are lower than 70 (goal <4%).  The  calculated average blood sugar is ***.  The projected HbA1c for the next 3 months (GMI) is ***. -Reviewing the CGM trends, ***  Patient Instructions  Please continue: - Jardiance  10 mg daily in am - Mounjaro  12.5 mg weekly  Please stop at the lab.  Please return in 4-6 months.  - we checked his HbA1c: 7%  - advised to check sugars at different times of the day - 4x a day, rotating check times - advised for yearly eye exams >> he is UTD - return to clinic in 4-6 months  2.  Amiodarone  induced thyrotoxicosis (AIT) -Patient with history of undetectable TSH and high free T4 in the setting of amiodarone  treatment.  He did not have thyrotoxic symptoms: Weight loss, heat intolerance, hyperdefecation, palpitations, anxiety, and in fact he had a 21 pound weight gain possibly also related to fluid overload.  Amiodarone  was stopped in 11/2021 due to thyrotoxicosis.  At that time, he was started on methimazole  5 mg twice a day and prednisone  20 mg daily.  He was also on Toprol -XL 100 mg daily.  When I first saw him, he did not have any apparent culprit for thyrotoxicosis other than amiodarone .  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  We discussed that a type II AIT was usually managed by prednisone  so I advised him to decrease the methimazole  dose.  We decreased the dose of prednisone  further and he was able to come off the steroids subsequently (he actually ran out of prednisone  and did not refill it).  Fortunately, TFTs remained normal afterwards, including at last visit in 09/2023. - No thyrotoxic signs or symptoms at today's visit - We will recheck his TFTs  Lela Fendt, MD PhD [facility] Endocrinology

## 2024-04-12 NOTE — Progress Notes (Signed)
 Pt called for pre procedure instructions. Arrival time 0630 NPO after midnight explained Instructed to take am meds with sip of water and confirmed blood thinner consistency Instructed pt need for ride home tomorrow and have responsible adult with them for 24 hrs post procedure.

## 2024-04-13 ENCOUNTER — Ambulatory Visit (HOSPITAL_COMMUNITY)
Admission: RE | Admit: 2024-04-13 | Discharge: 2024-04-13 | Disposition: A | Discharge status: Home / Self Care | Attending: Internal Medicine | Admitting: Internal Medicine

## 2024-04-13 ENCOUNTER — Other Ambulatory Visit: Payer: Self-pay

## 2024-04-13 ENCOUNTER — Encounter (HOSPITAL_COMMUNITY): Admission: RE | Disposition: A | Payer: Self-pay | Attending: Internal Medicine

## 2024-04-13 ENCOUNTER — Encounter (HOSPITAL_COMMUNITY): Payer: Self-pay | Admitting: Internal Medicine

## 2024-04-13 ENCOUNTER — Ambulatory Visit (HOSPITAL_COMMUNITY): Admitting: Anesthesiology

## 2024-04-13 DIAGNOSIS — Z7984 Long term (current) use of oral hypoglycemic drugs: Secondary | ICD-10-CM | POA: Insufficient documentation

## 2024-04-13 DIAGNOSIS — E1122 Type 2 diabetes mellitus with diabetic chronic kidney disease: Secondary | ICD-10-CM | POA: Insufficient documentation

## 2024-04-13 DIAGNOSIS — N1832 Chronic kidney disease, stage 3b: Secondary | ICD-10-CM | POA: Diagnosis not present

## 2024-04-13 DIAGNOSIS — I428 Other cardiomyopathies: Secondary | ICD-10-CM | POA: Diagnosis not present

## 2024-04-13 DIAGNOSIS — I2510 Atherosclerotic heart disease of native coronary artery without angina pectoris: Secondary | ICD-10-CM | POA: Diagnosis not present

## 2024-04-13 DIAGNOSIS — E0590 Thyrotoxicosis, unspecified without thyrotoxic crisis or storm: Secondary | ICD-10-CM | POA: Diagnosis not present

## 2024-04-13 DIAGNOSIS — Z89411 Acquired absence of right great toe: Secondary | ICD-10-CM | POA: Insufficient documentation

## 2024-04-13 DIAGNOSIS — Z6841 Body Mass Index (BMI) 40.0 and over, adult: Secondary | ICD-10-CM | POA: Diagnosis not present

## 2024-04-13 DIAGNOSIS — Z79899 Other long term (current) drug therapy: Secondary | ICD-10-CM | POA: Insufficient documentation

## 2024-04-13 DIAGNOSIS — Z87891 Personal history of nicotine dependence: Secondary | ICD-10-CM | POA: Diagnosis not present

## 2024-04-13 DIAGNOSIS — I4892 Unspecified atrial flutter: Secondary | ICD-10-CM | POA: Insufficient documentation

## 2024-04-13 DIAGNOSIS — G4733 Obstructive sleep apnea (adult) (pediatric): Secondary | ICD-10-CM | POA: Diagnosis not present

## 2024-04-13 DIAGNOSIS — I130 Hypertensive heart and chronic kidney disease with heart failure and stage 1 through stage 4 chronic kidney disease, or unspecified chronic kidney disease: Secondary | ICD-10-CM | POA: Insufficient documentation

## 2024-04-13 DIAGNOSIS — Z8619 Personal history of other infectious and parasitic diseases: Secondary | ICD-10-CM | POA: Diagnosis not present

## 2024-04-13 DIAGNOSIS — I5042 Chronic combined systolic (congestive) and diastolic (congestive) heart failure: Secondary | ICD-10-CM | POA: Diagnosis not present

## 2024-04-13 DIAGNOSIS — I480 Paroxysmal atrial fibrillation: Secondary | ICD-10-CM | POA: Insufficient documentation

## 2024-04-13 DIAGNOSIS — Z7901 Long term (current) use of anticoagulants: Secondary | ICD-10-CM | POA: Diagnosis not present

## 2024-04-13 DIAGNOSIS — E66813 Obesity, class 3: Secondary | ICD-10-CM | POA: Diagnosis not present

## 2024-04-13 DIAGNOSIS — I5021 Acute systolic (congestive) heart failure: Secondary | ICD-10-CM

## 2024-04-13 DIAGNOSIS — I4891 Unspecified atrial fibrillation: Secondary | ICD-10-CM | POA: Diagnosis not present

## 2024-04-13 DIAGNOSIS — Z8673 Personal history of transient ischemic attack (TIA), and cerebral infarction without residual deficits: Secondary | ICD-10-CM | POA: Diagnosis not present

## 2024-04-13 DIAGNOSIS — Z7985 Long-term (current) use of injectable non-insulin antidiabetic drugs: Secondary | ICD-10-CM | POA: Diagnosis not present

## 2024-04-13 HISTORY — PX: CARDIOVERSION: EP1203

## 2024-04-13 LAB — GLUCOSE, CAPILLARY: Glucose-Capillary: 118 mg/dL — ABNORMAL HIGH (ref 70–99)

## 2024-04-13 SURGERY — CARDIOVERSION (CATH LAB)
Anesthesia: General

## 2024-04-13 MED ORDER — SODIUM CHLORIDE 0.9 % IV SOLN: INTRAVENOUS | Status: DC

## 2024-04-13 MED ORDER — LIDOCAINE 2% (20 MG/ML) 5 ML SYRINGE
INTRAMUSCULAR | Status: DC | PRN
  Administered 2024-04-13: 08:00:00 100 mg via INTRAVENOUS

## 2024-04-13 MED ORDER — PROPOFOL 10 MG/ML IV BOLUS
INTRAVENOUS | Status: DC | PRN
  Administered 2024-04-13: 08:00:00 80 mg via INTRAVENOUS

## 2024-04-13 SURGICAL SUPPLY — 1 items: PAD DEFIB RADIO PHYSIO CONN (PAD) ×1 IMPLANT

## 2024-04-13 NOTE — CV Procedure (Signed)
° °   DIRECT CURRENT CARDIOVERSION  NAME:  Randy [name]   MRN: [id number] DOB:  09/09/1970   ADMIT DATE: 04/13/2024   INDICATIONS: Atrial fibrillation    PROCEDURE:   Informed consent was obtained prior to the procedure. The risks, benefits and alternatives for the procedure were discussed and the patient comprehended these risks. Once an appropriate time out was taken, the patient had the defibrillator pads placed in the anterior and posterior position. The patient then underwent sedation by the anesthesia service. Once an appropriate level of sedation was achieved, the patient received a single biphasic, synchronized 250J shock with prompt conversion to sinus rhythm. No apparent complications.  Toribio Fuel, MD  7:47 AM

## 2024-04-13 NOTE — Anesthesia Postprocedure Evaluation (Signed)
 Anesthesia Post Note  Patient: Randy [name]  Procedure(s) Performed: CARDIOVERSION     Patient location during evaluation: Cath Lab Anesthesia Type: General Level of consciousness: awake and alert Pain management: pain level controlled Vital Signs Assessment: post-procedure vital signs reviewed and stable Respiratory status: spontaneous breathing, nonlabored ventilation, respiratory function stable and patient connected to nasal cannula oxygen Cardiovascular status: blood pressure returned to baseline and stable Postop Assessment: no apparent nausea or vomiting Anesthetic complications: no   There were no known notable events for this encounter.  Last Vitals:  Vitals:   04/13/24 0815 04/13/24 0820  BP: 122/86 (!) 127/92  Pulse: 72 72  Resp: 17 13  Temp:    SpO2: 97% 98%    Last Pain:  Vitals:   04/13/24 0820  TempSrc:   PainSc: 0-No pain                 Rome Ade

## 2024-04-13 NOTE — Procedures (Signed)
°  SLEEP STUDY REPORT Patient Information Study Date: 04/10/2024 Patient Name: Randy [name] Patient ID: [id number] Birth Date: 03-18-1971 Age: 53 Gender: BMI: 42.5 (W=385 lb, H=6' 8'') Stopbang: 7 Referring Physician: Laymon Shed, PA  TEST DESCRIPTION: Home sleep apnea testing was completed using the WatchPat, a Type 1 device, utilizing peripheral arterial tonometry (PAT), chest movement, actigraphy, pulse oximetry, pulse rate, body position and snore. AHI was calculated with apnea and hypopnea using valid sleep time as the denominator. RDI includes apneas, hypopneas, and RERAs. The data acquired and the scoring of sleep and all associated events were performed in accordance with the recommended standards and specifications as outlined in the AASM Manual for the Scoring of Sleep and Associated Events 2.2.0 (2015).  FINDINGS: 1. No evidence of Obstructive Sleep Apnea with AHI 3.6/hr. 2. No Central Sleep Apnea. 3. Oxygen desaturations as low as 86%. 4. Moderate snoring was present. O2 sats were < 88% for 0.68minutes. 5. Total sleep time was 3 hrs and 1 min. 6. 0% of total sleep time was spent in REM sleep. 7. Normal sleep onset latency at 17 min. 8. No REM sleep was present. 9. Total awakenings were 12. 10. Arrhythmia Detection: Possible Atrial fibrillation lasting 4 minutes 35 seconds. This study is not diagnostic of atrial fibrillation. Consider further evaluation is clinically indicated.  DIAGNOSIS: Nondiagnostic study due to lack of REM sleep and poor PAT tracing. Possible Atrial Fibrillation  RECOMMENDATIONS: Recommend in lab NPSG.  Signature: Wilbert Bihari, MD; [facility]; Diplomat, American Board of Sleep Medicine Electronically Signed: 04/13/2024 6:55:23 PM

## 2024-04-13 NOTE — Interval H&P Note (Signed)
 History and Physical Interval Note:  04/13/2024 7:38 AM  Randy [name]  has presented today for surgery, with the diagnosis of AFIB.  The various methods of treatment have been discussed with the patient and family. After consideration of risks, benefits and other options for treatment, the patient has consented to  Procedures: CARDIOVERSION (N/A) as a surgical intervention.  The patient's history has been reviewed, patient examined, no change in status, stable for surgery.  I have reviewed the patient's chart and labs.  Questions were answered to the patient's satisfaction.     [name]

## 2024-04-13 NOTE — Transfer of Care (Signed)
 43rImmediate Anesthesia Transfer of Care Note  Patient: Randy [name]  Procedure(s) Performed: CARDIOVERSION  Patient Location: Cath Lab  Anesthesia Type:General  Level of Consciousness: awake, alert , and oriented  Airway & Oxygen Therapy: Patient Spontanous Breathing and Patient connected to nasal cannula oxygen  Post-op Assessment: Report given to RN and Post -op Vital signs reviewed and stable  Post vital signs: Reviewed and stable  Last Vitals:  Vitals Value Taken Time  BP 125/93 0750  Temp 37 0750  Pulse 76 0750  Resp 16 0750  SpO2 99 0750    Last Pain:  Vitals:   04/13/24 0644  TempSrc: Tympanic  PainSc: 0-No pain         Complications: No notable events documented.

## 2024-04-13 NOTE — Discharge Instructions (Signed)
 Electrical Cardioversion Electrical cardioversion is the delivery of a jolt of electricity to restore a normal rhythm to the heart. A rhythm that is too fast or is not regular keeps the heart from pumping well. In this procedure, sticky patches or metal paddles are placed on the chest to deliver electricity to the heart from a device. This procedure may be done in an emergency if: There is low or no blood pressure as a result of the heart rhythm. Normal rhythm must be restored as fast as possible to protect the brain and heart from further damage. It may save a life. This may also be a scheduled procedure for irregular or fast heart rhythms that are not immediately life-threatening.  What can I expect after the procedure? Your blood pressure, heart rate, breathing rate, and blood oxygen level will be monitored until you leave the hospital or clinic. Your heart rhythm will be watched to make sure it does not change. You may have some redness on the skin where the shocks were given. Over the counter cortizone cream may be helpful.  Follow these instructions at home: Do not drive for 24 hours if you were given a sedative during your procedure. Take over-the-counter and prescription medicines only as told by your health care provider. Ask your health care provider how to check your pulse. Check it often. Rest for 48 hours after the procedure or as told by your health care provider. Avoid or limit your caffeine use as told by your health care provider. Keep all follow-up visits as told by your health care provider. This is important. Contact a health care provider if: You feel like your heart is beating too quickly or your pulse is not regular. You have a serious muscle cramp that does not go away. Get help right away if: You have discomfort in your chest. You are dizzy or you feel faint. You have trouble breathing or you are short of breath. Your speech is slurred. You have trouble moving an  arm or leg on one side of your body. Your fingers or toes turn cold or blue. Summary Electrical cardioversion is the delivery of a jolt of electricity to restore a normal rhythm to the heart. This procedure may be done right away in an emergency or may be a scheduled procedure if the condition is not an emergency. Generally, this is a safe procedure. After the procedure, check your pulse often as told by your health care provider. This information is not intended to replace advice given to you by your health care provider. Make sure you discuss any questions you have with your health care provider. Document Revised: 11/14/2018 Document Reviewed: 11/14/2018 Elsevier Patient EducatiElectrical Cardioversion Electrical cardioversion is the delivery of a jolt of electricity to restore a normal rhythm to the heart. A rhythm that is too fast or is not regular (arrhythmia) keeps the heart from pumping blood well. There is also another type of cardioversion called a chemical (pharmacologic) cardioversion. This is when your health care provider gives you one or more medicines to bring back your regular heart rhythm. Electrical cardioversion is done as a scheduled procedure for arrhythmiasthat are not life-threatening. Electrical cardioversion may also be done in an emergency for sudden life-threatening arrhythmias. Tell a health care provider about: Any allergies you have. All medicines you are taking, including vitamins, herbs, eye drops, creams, and over-the-counter medicines. Any problems you or family members have had with sedatives or anesthesia. Any bleeding problems you have. Any surgeries you  have had, including a pacemaker, defibrillator, or other implanted device. Any medical conditions you have. Whether you are pregnant or may be pregnant. What are the risks? Your provider will talk with you about risks. These include: Allergic reactions to medicines. Irritation to the skin on your chest or  back where the sticky pads (electrodes) or paddles were put during electrical cardioversion. A blood clot that breaks free and travels to other parts of your body, such as your brain. Return of a worse abnormal heart rhythm that will need to be treated with medicines, a pacemaker, or an implantable cardioverter defibrillator (ICD). What happens before the procedure? Medicines Your provider may give you: Blood-thinning medicines (anticoagulants) so your blood does not clot as easily. If your provider gives you this medicine, you may need to take it for 4 weeks before the procedure. Medicines to help stabilize your heart rate and rhythm. Ask your provider about: Changing or stopping your regular medicines. These include any diabetes medicines or blood thinners you take. Taking medicines such as aspirin and ibuprofen. These medicines can thin your blood. Do not take them unless your provider tells you to. Taking over-the-counter medicines, vitamins, herbs, and supplements. General instructions Follow instructions from your provider about what you may eat and drink. Do not put any lotions, powders, or ointments on your chest and back for 24 hours before the procedure. They can cause problems with the electrodes or paddles used to deliver electricity to your heart. Do not wear jewelry as this can interfere with delivering electricity to your heart. If you will be going home right after the procedure, plan to have a responsible adult: Take you home from the hospital or clinic. You will not be allowed to drive. Care for you for the time you are told. Tests You may have an exam or testing. This may include: Blood labs. A transesophageal echocardiogram (TEE). What happens during the procedure?     An IV will be inserted into one of your veins. You will be given a sedative. This helps you relax. Electrodes or metal paddles will be placed on your chest. They may be placed in one of these ways: One  placed on your right chest, the other on the left ribs. One placed on your chest and the other on your back. An electrical shock will be delivered. The shock briefly stops (resets) your heart rhythm. Your provider will check to see if your heart rhythm is now normal. Some people need only one shock. Some need more to restore a normal heart rhythm. The procedure may vary among providers and hospitals. What happens after the procedure? Your blood pressure, heart rate, breathing rate, and blood oxygen level will be monitored until you leave the hospital or clinic. Your heart rhythm will be watched to make sure it does not change. This information is not intended to replace advice given to you by your health care provider. Make sure you discuss any questions you have with your health care provider. Document Revised: 12/04/2021 Document Reviewed: 12/04/2021 Elsevier Patient Education  2024 Elsevier Inc[employer]on  2020 [employer].

## 2024-04-13 NOTE — Anesthesia Preprocedure Evaluation (Signed)
 Anesthesia Evaluation  Patient identified by MRN, date of birth, ID band Patient awake    Reviewed: Allergy & Precautions, NPO status , Patient's Chart, lab work & pertinent test results  History of Anesthesia Complications Negative for: history of anesthetic complications  Airway Mallampati: II  TM Distance: >3 FB Neck ROM: Full    Dental no notable dental hx. (+) Teeth Intact   Pulmonary neg pulmonary ROS, neg sleep apnea, neg COPD, Patient abstained from smoking.Not current smoker, former smoker   Pulmonary exam normal breath sounds clear to auscultation       Cardiovascular Exercise Tolerance: Good METShypertension, Pt. on medications +CHF  (-) CAD and (-) Past MI + dysrhythmias Atrial Fibrillation + Valvular Problems/Murmurs  Rhythm:Irregular Rate:Tachycardia - Systolic murmurs    Neuro/Psych CVA, No Residual Symptoms  negative psych ROS   GI/Hepatic ,neg GERD  ,,(+)     (-) substance abuse    Endo/Other  diabetes, Oral Hypoglycemic Agents  Class 3 obesity  Renal/GU CRFRenal disease     Musculoskeletal   Abdominal  (+) + obese  Peds  Hematology   Anesthesia Other Findings Past Medical History: No date: CKD (chronic kidney disease) stage 3, GFR 30-59 ml/min (HCC)     Comment:  baseline Cr 1.5-1.7 No date: Decreased visual acuity     Comment:  Left eye, resolved - hypertensive retinopathy 2012: Diabetes mellitus     Comment:  Type II No date: Hilar adenopathy     Comment:  on CT scan 02/2010, on rpt scan stable/improved. 12/2007: History of Bell's palsy     Comment:  history, Left No date: History of headache No date: HTN (hypertension), malignant     Comment:  previously on BC and goody powders for HA 12/2007: Internal derangement of knee     Comment:  Left No date: Microalbuminuria No date: Morbid obesity (HCC) No date: Stroke [facility]) No date: Systolic CHF [facility])     Comment:  echo 2011 with  nonischemic hypertensive cardiomyopathy No date: Systolic murmur No date: Vitamin D  deficiency  Reproductive/Obstetrics                              Anesthesia Physical Anesthesia Plan  ASA: 3  Anesthesia Plan: General   Post-op Pain Management: Minimal or no pain anticipated   Induction: Intravenous  PONV Risk Score and Plan: 2 and Propofol  infusion, TIVA and Ondansetron   Airway Management Planned: Nasal Cannula  Additional Equipment: None  Intra-op Plan:   Post-operative Plan:   Informed Consent: I have reviewed the patients History and Physical, chart, labs and discussed the procedure including the risks, benefits and alternatives for the proposed anesthesia with the patient or authorized representative who has indicated his/her understanding and acceptance.     Dental advisory given  Plan Discussed with: CRNA and Surgeon  Anesthesia Plan Comments: (Discussed risks of anesthesia with patient, including possibility of difficulty with spontaneous ventilation under anesthesia necessitating airway intervention, PONV, and rare risks such as cardiac or respiratory or neurological events, and allergic reactions. Discussed the role of CRNA in patient's perioperative care. Patient understands. Patient informed about increased incidence of above perioperative risk due to high BMI. Patient understands.  )        Anesthesia Quick Evaluation

## 2024-04-14 ENCOUNTER — Telehealth: Payer: Self-pay | Admitting: *Deleted

## 2024-04-14 DIAGNOSIS — R0683 Snoring: Secondary | ICD-10-CM

## 2024-04-14 DIAGNOSIS — G4733 Obstructive sleep apnea (adult) (pediatric): Secondary | ICD-10-CM

## 2024-04-14 DIAGNOSIS — I4819 Other persistent atrial fibrillation: Secondary | ICD-10-CM

## 2024-04-14 DIAGNOSIS — I5022 Chronic systolic (congestive) heart failure: Secondary | ICD-10-CM

## 2024-04-14 DIAGNOSIS — I10 Essential (primary) hypertension: Secondary | ICD-10-CM

## 2024-04-14 NOTE — Telephone Encounter (Signed)
 The patient has been notified of the result and verbalized understanding.  All questions (if any) were answered. Randy [name], CMA 04/14/2024 11:25 AM    Patient says he can't sleep in a strange place (sleep lab) and wants to retest using another home sleep test.

## 2024-04-14 NOTE — Telephone Encounter (Signed)
-----   Message from Wilbert Bihari, MD sent at 04/13/2024  6:58 PM EST ----- Nondiagnostic study for OSA due to poor PAT tracing and lack of REM sleep - please set up for in lab NPSG

## 2024-04-16 ENCOUNTER — Other Ambulatory Visit (HOSPITAL_COMMUNITY): Payer: Self-pay

## 2024-04-17 ENCOUNTER — Other Ambulatory Visit: Payer: Self-pay

## 2024-05-08 ENCOUNTER — Other Ambulatory Visit (HOSPITAL_COMMUNITY): Payer: Self-pay

## 2024-05-08 ENCOUNTER — Other Ambulatory Visit: Payer: Self-pay

## 2024-05-10 ENCOUNTER — Other Ambulatory Visit: Payer: Self-pay

## 2024-05-10 ENCOUNTER — Encounter: Payer: Self-pay | Admitting: Pharmacist

## 2024-05-11 ENCOUNTER — Other Ambulatory Visit: Payer: Self-pay

## 2024-05-16 ENCOUNTER — Other Ambulatory Visit: Payer: Self-pay

## 2024-05-16 ENCOUNTER — Other Ambulatory Visit (HOSPITAL_COMMUNITY): Payer: Self-pay

## 2024-05-17 ENCOUNTER — Other Ambulatory Visit (HOSPITAL_BASED_OUTPATIENT_CLINIC_OR_DEPARTMENT_OTHER): Payer: Self-pay

## 2024-05-17 ENCOUNTER — Other Ambulatory Visit: Payer: Self-pay

## 2024-05-17 NOTE — Telephone Encounter (Signed)
 Per dr Shlomo, I am fine with repeating the Itamar but patient is going to have to be counseled on how to get good contact and fit on the finger

## 2024-05-17 NOTE — Telephone Encounter (Signed)
 Order entered by Laymon Shed CHF office.

## 2024-05-18 ENCOUNTER — Other Ambulatory Visit (HOSPITAL_COMMUNITY): Payer: Self-pay

## 2024-05-18 ENCOUNTER — Other Ambulatory Visit: Payer: Self-pay

## 2024-05-22 ENCOUNTER — Other Ambulatory Visit: Payer: Self-pay

## 2024-05-30 ENCOUNTER — Other Ambulatory Visit (HOSPITAL_COMMUNITY): Payer: Self-pay

## 2024-05-31 ENCOUNTER — Other Ambulatory Visit: Payer: Self-pay
